# Patient Record
Sex: Male | Born: 1960 | Race: White | Hispanic: No | Marital: Married | Smoking: Current every day smoker
Health system: Southern US, Community
[De-identification: ages and names within clinical notes are randomized; demographics above are authoritative.]

## PROBLEM LIST (undated history)

## (undated) DIAGNOSIS — I119 Hypertensive heart disease without heart failure: Secondary | ICD-10-CM

## (undated) DIAGNOSIS — C3490 Malignant neoplasm of unspecified part of unspecified bronchus or lung: Secondary | ICD-10-CM

## (undated) DIAGNOSIS — K922 Gastrointestinal hemorrhage, unspecified: Secondary | ICD-10-CM

## (undated) DIAGNOSIS — I82402 Acute embolism and thrombosis of unspecified deep veins of left lower extremity: Secondary | ICD-10-CM

## (undated) DIAGNOSIS — E785 Hyperlipidemia, unspecified: Secondary | ICD-10-CM

## (undated) DIAGNOSIS — D649 Anemia, unspecified: Secondary | ICD-10-CM

## (undated) DIAGNOSIS — I2510 Atherosclerotic heart disease of native coronary artery without angina pectoris: Secondary | ICD-10-CM

## (undated) DIAGNOSIS — I739 Peripheral vascular disease, unspecified: Secondary | ICD-10-CM

## (undated) DIAGNOSIS — Z720 Tobacco use: Secondary | ICD-10-CM

## (undated) DIAGNOSIS — IMO0002 Reserved for concepts with insufficient information to code with codable children: Secondary | ICD-10-CM

## (undated) HISTORY — DX: Hyperlipidemia, unspecified: E78.5

## (undated) HISTORY — DX: Peripheral vascular disease, unspecified: I73.9

## (undated) HISTORY — DX: Atherosclerotic heart disease of native coronary artery without angina pectoris: I25.10

## (undated) HISTORY — DX: Tobacco use: Z72.0

## (undated) HISTORY — DX: Malignant neoplasm of unspecified part of unspecified bronchus or lung: C34.90

## (undated) HISTORY — DX: Acute embolism and thrombosis of unspecified deep veins of left lower extremity: I82.402

## (undated) HISTORY — DX: Anemia, unspecified: D64.9

## (undated) HISTORY — DX: Gastrointestinal hemorrhage, unspecified: K92.2

## (undated) HISTORY — DX: Hypertensive heart disease without heart failure: I11.9

---

## 2008-08-30 ENCOUNTER — Emergency Department: Payer: Self-pay | Admitting: Emergency Medicine

## 2009-11-28 ENCOUNTER — Emergency Department: Payer: Self-pay | Admitting: Unknown Physician Specialty

## 2010-07-03 ENCOUNTER — Ambulatory Visit: Payer: Self-pay | Admitting: Internal Medicine

## 2014-04-07 ENCOUNTER — Emergency Department: Payer: Self-pay | Admitting: Emergency Medicine

## 2014-04-16 DIAGNOSIS — I82402 Acute embolism and thrombosis of unspecified deep veins of left lower extremity: Secondary | ICD-10-CM

## 2014-04-16 HISTORY — DX: Acute embolism and thrombosis of unspecified deep veins of left lower extremity: I82.402

## 2014-06-21 ENCOUNTER — Ambulatory Visit: Payer: Self-pay | Admitting: Family Medicine

## 2014-07-01 ENCOUNTER — Emergency Department (HOSPITAL_COMMUNITY)
Admission: EM | Admit: 2014-07-01 | Discharge: 2014-07-02 | Disposition: A | Discharge status: Home / Self Care | Payer: No Typology Code available for payment source | Attending: Emergency Medicine | Admitting: Emergency Medicine

## 2014-07-01 ENCOUNTER — Encounter (HOSPITAL_COMMUNITY): Payer: Self-pay | Admitting: Emergency Medicine

## 2014-07-01 DIAGNOSIS — Z720 Tobacco use: Secondary | ICD-10-CM | POA: Insufficient documentation

## 2014-07-01 DIAGNOSIS — L03115 Cellulitis of right lower limb: Secondary | ICD-10-CM

## 2014-07-01 DIAGNOSIS — D649 Anemia, unspecified: Secondary | ICD-10-CM | POA: Diagnosis not present

## 2014-07-01 DIAGNOSIS — R52 Pain, unspecified: Secondary | ICD-10-CM

## 2014-07-01 DIAGNOSIS — M79661 Pain in right lower leg: Secondary | ICD-10-CM | POA: Diagnosis present

## 2014-07-01 DIAGNOSIS — M25571 Pain in right ankle and joints of right foot: Secondary | ICD-10-CM | POA: Diagnosis not present

## 2014-07-01 DIAGNOSIS — D509 Iron deficiency anemia, unspecified: Secondary | ICD-10-CM

## 2014-07-01 NOTE — ED Provider Notes (Signed)
CSN: [id number]     Arrival date & time 07/01/14  2153 History  This chart was scribed for Delora Fuel, MD by Rayfield Citizen, ED Scribe. This patient was seen in room A05C/A05C and the patient's care was started at 1:44 AM.    Chief Complaint  Patient presents with  . Leg Pain  . Leg Swelling   Patient is a 54 y.o. male presenting with leg pain. The history is provided by the patient. No language interpreter was used.  Leg Pain Associated symptoms: no fatigue     HPI Comments: DINERO CHAVIRA is a 54 y.o. male who presents to the Emergency Department complaining of right ankle and leg pain with redness and swelling. He reports cramping throughout the right leg, extending from ankle to groin. Patient explains he does a significant amount of walking and lifting; he denies any recent injury or trauma to the area. He currently rates his pain as 10/10, described as achy and sharp. He denies SOB, chest pain/pressure/heaviness/tightness, fever, chills, or sweats.   Beginning 6 weeks PTA, patient noted painful swelling to the top of his right foot; he was seen at [facility] at that time and told he had two stress fractures and a bone spur; he began wearing a support boot which provided transient relief. He was seen again 2 weeks PTA with increased swelling around the ankle and lower leg; at that time, he was diagnosed with cellulitis at an Urgent Care clinic and given Bactrim and Indomethacin.   He is scheduled to be seen at [facility] walk in clinic on 3/22. No PCP at this time; has not been seen in "20 - 25 years."  Past Medical History  Diagnosis Date  . Foot pain    History reviewed. No pertinent past surgical history. No family history on file. History  Substance Use Topics  . Smoking status: Current Every Day Smoker  . Smokeless tobacco: Not on file  . Alcohol Use: Yes    Review of Systems  Constitutional: Negative for chills, diaphoresis and fatigue.  Respiratory: Negative for shortness of  breath.   Cardiovascular: Negative for chest pain.  Musculoskeletal:       Right ankle and foot pain and swelling  All other systems reviewed and are negative.   Allergies  Contrast media  Home Medications   Prior to Admission medications   Not on File   BP 158/100 mmHg  Pulse 84  Temp(Src) 97.7 F (36.5 C) (Oral)  Resp 12  Ht 5\' 11"  (1.803 m)  Wt 176 lb (79.833 kg)  BMI 24.56 kg/m2  SpO2 100% Physical Exam  Constitutional: He is oriented to person, place, and time. He appears well-developed and well-nourished. No distress.  HENT:  Head: Normocephalic and atraumatic.  Mouth/Throat: Oropharynx is clear and moist. No oropharyngeal exudate.  Moist mucous membranes  Eyes: EOM are normal. Pupils are equal, round, and reactive to light.  Neck: Normal range of motion. Neck supple. No JVD present.  Cardiovascular: Normal rate and regular rhythm.  Exam reveals no gallop.   No murmur heard. Pulmonary/Chest: Effort normal and breath sounds normal. He has no wheezes. He has no rales.  Abdominal: Soft. Bowel sounds are normal. He exhibits no mass. There is no tenderness. There is no rebound and no guarding.  Musculoskeletal: Normal range of motion. He exhibits edema and tenderness.  Right ankle: moderate swelling, erythema and warmth with diffuse tenderness. No calf swelling but mild calf tenderness. No cord.   Lymphadenopathy:  He has no cervical adenopathy.  Neurological: He is alert and oriented to person, place, and time. He has normal reflexes. He displays normal reflexes. No cranial nerve deficit. Coordination normal.  Skin: Skin is warm and dry. No rash noted.  Psychiatric: He has a normal mood and affect. His behavior is normal. Thought content normal.  Nursing note and vitals reviewed.   ED Course  Procedures   DIAGNOSTIC STUDIES: Oxygen Saturation is 98% on RA, normal by my interpretation.    COORDINATION OF CARE: 1:53 AM Discussed treatment plan with pt at bedside  and pt agreed to plan.   Labs Review Results for orders placed or performed during the hospital encounter of 18/29/93  Basic metabolic panel  Result Value Ref Range   Sodium 133 (L) 135 - 145 mmol/L   Potassium 4.4 3.5 - 5.1 mmol/L   Chloride 103 96 - 112 mmol/L   CO2 22 19 - 32 mmol/L   Glucose, Bld 105 (H) 70 - 99 mg/dL   BUN 6 6 - 23 mg/dL   Creatinine, Ser 0.74 0.50 - 1.35 mg/dL   Calcium 9.0 8.4 - 10.5 mg/dL   GFR calc non Af Amer >90 >90 mL/min   GFR calc Af Amer >90 >90 mL/min   Anion gap 8 5 - 15  CBC with Differential  Result Value Ref Range   WBC 6.3 4.0 - 10.5 K/uL   RBC 4.55 4.22 - 5.81 MIL/uL   Hemoglobin 9.3 (L) 13.0 - 17.0 g/dL   HCT 31.3 (L) 39.0 - 52.0 %   MCV 68.8 (L) 78.0 - 100.0 fL   MCH 20.4 (L) 26.0 - 34.0 pg   MCHC 29.7 (L) 30.0 - 36.0 g/dL   RDW 19.3 (H) 11.5 - 15.5 %   Platelets 223 150 - 400 K/uL   Neutrophils Relative % 60 43 - 77 %   Neutro Abs 3.8 1.7 - 7.7 K/uL   Lymphocytes Relative 28 12 - 46 %   Lymphs Abs 1.7 0.7 - 4.0 K/uL   Monocytes Relative 9 3 - 12 %   Monocytes Absolute 0.6 0.1 - 1.0 K/uL   Eosinophils Relative 2 0 - 5 %   Eosinophils Absolute 0.1 0.0 - 0.7 K/uL   Basophils Relative 1 0 - 1 %   Basophils Absolute 0.1 0.0 - 0.1 K/uL  Uric acid  Result Value Ref Range   Uric Acid, Serum 7.2 4.0 - 7.8 mg/dL  Sedimentation rate  Result Value Ref Range   Sed Rate 17 (H) 0 - 16 mm/hr  D-dimer, quantitative  Result Value Ref Range   D-Dimer, Quant 0.29 0.00 - 0.48 ug/mL-FEU   Imaging Review Dg Ankle Complete Right  07/02/2014   CLINICAL DATA:  Chronic right ankle pain and swelling. Initial encounter.  EXAM: RIGHT ANKLE - COMPLETE 3+ VIEW  COMPARISON:  None.  FINDINGS: There is no evidence of fracture or dislocation. The ankle mortise is incompletely assessed but appears grossly intact; the interosseous space is within normal limits. No talar tilt or subluxation is seen. A small plantar calcaneal spur is noted.  The joint spaces are  preserved. There is mild diffuse edema within Kager's fat pad. Mild soft tissue edema is noted overlying the lateral malleolus.  IMPRESSION: No evidence of fracture or dislocation.   Electronically Signed   By: Garald Balding M.D.   On: 07/02/2014 03:04   Dg Foot Complete Right  07/02/2014   CLINICAL DATA:  Chronic right ankle swelling and pain.  Initial encounter.  EXAM: RIGHT FOOT COMPLETE - 3+ VIEW  COMPARISON:  None.  FINDINGS: There is no evidence of fracture or dislocation. There is mild diffuse osteopenia of visualized osseous structures. The joint spaces are preserved. There is no evidence of talar subluxation; the subtalar joint is unremarkable in appearance. A small plantar calcaneal spur is noted.  No significant soft tissue abnormalities are seen.  IMPRESSION: 1. No evidence of fracture or dislocation. 2. Mild diffuse osteopenia of visualized osseous structures.   Electronically Signed   By: Garald Balding M.D.   On: 07/02/2014 03:03      MDM   Final diagnoses:  Pain  Cellulitis of right lower leg  Microcytic anemia    Pain and swelling around the right ankle which has the appearance of cellulitis. Also need to consider possibility of gout although prolonged course of fever and usual. He has been given a course of antibiotics would be appropriate for MRSA from but has failed. He does not appear toxic. Screening labs are obtained showing a microcytic anemia. No prior labs are available to tell this as well. Patient is not aware of having anemia in the past. This will need to be investigated as an outpatient. I feel that this is worthy of one more attempt at outpatient treatment. He is discharged with a prescription for cephalexin. He has an appointment scheduled with [facility] in 4 days. If he is not improving at that time, may need to consider inpatient treatment.   I personally performed the services described in this documentation, which was scribed in my  presence. The recorded information has been reviewed and is accurate.       Delora Fuel, MD 84/16/60 [id number]

## 2014-07-01 NOTE — ED Notes (Signed)
Onset 6 weeks ago denies injury right foot and gradually developed swelling foot and ankle. Seen Doctor taken x-rays and given antibiotics for cellulitis. Completed medication however pain continued now radiating right upper leg. Pain currently 10/10 achy sharp.

## 2014-07-02 ENCOUNTER — Emergency Department (HOSPITAL_COMMUNITY): Payer: No Typology Code available for payment source

## 2014-07-02 LAB — CBC WITH DIFFERENTIAL/PLATELET
BASOS PCT: 1 % (ref 0–1)
Basophils Absolute: 0.1 10*3/uL (ref 0.0–0.1)
Eosinophils Absolute: 0.1 10*3/uL (ref 0.0–0.7)
Eosinophils Relative: 2 % (ref 0–5)
HCT: 31.3 % — ABNORMAL LOW (ref 39.0–52.0)
HEMOGLOBIN: 9.3 g/dL — AB (ref 13.0–17.0)
LYMPHS ABS: 1.7 10*3/uL (ref 0.7–4.0)
Lymphocytes Relative: 28 % (ref 12–46)
MCH: 20.4 pg — ABNORMAL LOW (ref 26.0–34.0)
MCHC: 29.7 g/dL — ABNORMAL LOW (ref 30.0–36.0)
MCV: 68.8 fL — ABNORMAL LOW (ref 78.0–100.0)
Monocytes Absolute: 0.6 10*3/uL (ref 0.1–1.0)
Monocytes Relative: 9 % (ref 3–12)
NEUTROS ABS: 3.8 10*3/uL (ref 1.7–7.7)
NEUTROS PCT: 60 % (ref 43–77)
Platelets: 223 10*3/uL (ref 150–400)
RBC: 4.55 MIL/uL (ref 4.22–5.81)
RDW: 19.3 % — ABNORMAL HIGH (ref 11.5–15.5)
WBC: 6.3 10*3/uL (ref 4.0–10.5)

## 2014-07-02 LAB — URIC ACID: Uric Acid, Serum: 7.2 mg/dL (ref 4.0–7.8)

## 2014-07-02 LAB — BASIC METABOLIC PANEL
Anion gap: 8 (ref 5–15)
BUN: 6 mg/dL (ref 6–23)
CO2: 22 mmol/L (ref 19–32)
Calcium: 9 mg/dL (ref 8.4–10.5)
Chloride: 103 mmol/L (ref 96–112)
Creatinine, Ser: 0.74 mg/dL (ref 0.50–1.35)
GFR calc Af Amer: >90 mL/min (ref >90)
GFR calc non Af Amer: >90 mL/min (ref >90)
Glucose, Bld: 105 mg/dL — ABNORMAL HIGH (ref 70–99)
Potassium: 4.4 mmol/L (ref 3.5–5.1)
Sodium: 133 mmol/L — ABNORMAL LOW (ref 135–145)

## 2014-07-02 LAB — SEDIMENTATION RATE: Sed Rate: 17 mm/hr — ABNORMAL HIGH (ref 0–16)

## 2014-07-02 LAB — D-DIMER, QUANTITATIVE (NOT AT ARMC): D DIMER QUANT: 0.29 ug{FEU}/mL (ref 0.00–0.48)

## 2014-07-02 MED ORDER — IBUPROFEN 800 MG PO TABS
800.0000 mg | ORAL_TABLET | Freq: Once | ORAL | Status: AC
  Administered 2014-07-02: 800 mg via ORAL
  Filled 2014-07-02: qty 1

## 2014-07-02 MED ORDER — CEPHALEXIN 500 MG PO CAPS: 500.0000 mg | ORAL_CAPSULE | Freq: Four times a day (QID) | ORAL | Status: DC

## 2014-07-02 MED ORDER — ACETAMINOPHEN 325 MG PO TABS
650.0000 mg | ORAL_TABLET | Freq: Once | ORAL | Status: AC
  Administered 2014-07-02: 650 mg via ORAL
  Filled 2014-07-02: qty 2

## 2014-07-02 NOTE — ED Notes (Signed)
Patient presents today with swelling and redness to his right ankle and foot. States 6 weeks ago he was diagnosed with a stress fracture in the right foot. Swelling on top of foot with Fx that went away when patient began wearing support boot. States the swelling returned @ 4weeks later and was also around the ankle and lower leg. Visited an urgent care clinic and diagnosed with cellulitis, placed on ABT Bactrim and Indomethacin. ABT is complete and swelling/pain remains. BLE cramping and vomiting today.

## 2014-07-02 NOTE — ED Notes (Signed)
Patient asked for and received a cup of ice water.

## 2014-07-02 NOTE — ED Notes (Signed)
Patient is being wheeled out in his own wheel chair by spouse. Stated understanding of the discharge instructions and education about how to access a computer and where to receive computer classes in order to log into My Chart also occurred.

## 2014-07-02 NOTE — Discharge Instructions (Signed)
Cellulitis Cellulitis is an infection of the skin and the tissue beneath it. The infected area is usually red and tender. Cellulitis occurs most often in the arms and lower legs.  CAUSES  Cellulitis is caused by bacteria that enter the skin through cracks or cuts in the skin. The most common types of bacteria that cause cellulitis are staphylococci and streptococci. SIGNS AND SYMPTOMS   Redness and warmth.  Swelling.  Tenderness or pain.  Fever. DIAGNOSIS  Your health care provider can usually determine what is wrong based on a physical exam. Blood tests may also be done. TREATMENT  Treatment usually involves taking an antibiotic medicine. HOME CARE INSTRUCTIONS   Take your antibiotic medicine as directed by your health care provider. Finish the antibiotic even if you start to feel better.  Keep the infected arm or leg elevated to reduce swelling.  Apply a warm cloth to the affected area up to 4 times per day to relieve pain.  Take medicines only as directed by your health care provider.  Keep all follow-up visits as directed by your health care provider. SEEK MEDICAL CARE IF:   You notice red streaks coming from the infected area.  Your red area gets larger or turns dark in color.  Your bone or joint underneath the infected area becomes painful after the skin has healed.  Your infection returns in the same area or another area.  You notice a swollen bump in the infected area.  You develop new symptoms.  You have a fever. SEEK IMMEDIATE MEDICAL CARE IF:   You feel very sleepy.  You develop vomiting or diarrhea.  You have a general ill feeling (malaise) with muscle aches and pains. MAKE SURE YOU:   Understand these instructions.  Will watch your condition.  Will get help right away if you are not doing well or get worse. Document Released: 01/10/2005 Document Revised: 08/17/2013 Document Reviewed: 06/18/2011 [facility] Patient Information 2015 [place], [place].  This information is not intended to replace advice given to you by your health care provider. Make sure you discuss any questions you have with your health care provider.  Anemia, Nonspecific Anemia is a condition in which the concentration of red blood cells or hemoglobin in the blood is below normal. Hemoglobin is a substance in red blood cells that carries oxygen to the tissues of the body. Anemia results in not enough oxygen reaching these tissues.  CAUSES  Common causes of anemia include:   Excessive bleeding. Bleeding may be internal or external. This includes excessive bleeding from periods (in women) or from the intestine.   Poor nutrition.   Chronic kidney, thyroid, and liver disease.  Bone marrow disorders that decrease red blood cell production.  Cancer and treatments for cancer.  HIV, AIDS, and their treatments.  Spleen problems that increase red blood cell destruction.  Blood disorders.  Excess destruction of red blood cells due to infection, medicines, and autoimmune disorders. SIGNS AND SYMPTOMS   Minor weakness.   Dizziness.   Headache.  Palpitations.   Shortness of breath, especially with exercise.   Paleness.  Cold sensitivity.  Indigestion.  Nausea.  Difficulty sleeping.  Difficulty concentrating. Symptoms may occur suddenly or they may develop slowly.  DIAGNOSIS  Additional blood tests are often needed. These help your health care provider determine the best treatment. Your health care provider will check your stool for blood and look for other causes of blood loss.  TREATMENT  Treatment varies depending on the cause of the  anemia. Treatment can include:   Supplements of iron, vitamin G40, or folic acid.   Hormone medicines.   A blood transfusion. This may be needed if blood loss is severe.   Hospitalization. This may be needed if there is significant continual blood loss.   Dietary changes.  Spleen removal. HOME CARE  INSTRUCTIONS Keep all follow-up appointments. It often takes many weeks to correct anemia, and having your health care provider check on your condition and your response to treatment is very important. SEEK IMMEDIATE MEDICAL CARE IF:   You develop extreme weakness, shortness of breath, or chest pain.   You become dizzy or have trouble concentrating.  You develop heavy vaginal bleeding.   You develop a rash.   You have bloody or black, tarry stools.   You faint.   You vomit up blood.   You vomit repeatedly.   You have abdominal pain.  You have a fever or persistent symptoms for more than 2-3 days.   You have a fever and your symptoms suddenly get worse.   You are dehydrated.  MAKE SURE YOU:  Understand these instructions.  Will watch your condition.  Will get help right away if you are not doing well or get worse. Document Released: 05/10/2004 Document Revised: 12/03/2012 Document Reviewed: 09/26/2012 [facility] Patient Information 2015 [place], [place]. This information is not intended to replace advice given to you by your health care provider. Make sure you discuss any questions you have with your health care provider.  Cephalexin tablets or capsules What is this medicine? CEPHALEXIN (sef a LEX in) is a cephalosporin antibiotic. It is used to treat certain kinds of bacterial infections It will not work for colds, flu, or other viral infections. This medicine may be used for other purposes; ask your health care provider or pharmacist if you have questions. COMMON BRAND NAME(S): Biocef, Keflex, Keftab What should I tell my health care provider before I take this medicine? They need to know if you have any of these conditions: -kidney disease -stomach or intestine problems, especially colitis -an unusual or allergic reaction to cephalexin, other cephalosporins, penicillins, other antibiotics, medicines, foods, dyes or preservatives -pregnant or trying to get  pregnant -breast-feeding How should I use this medicine? Take this medicine by mouth with a full glass of water. Follow the directions on the prescription label. This medicine can be taken with or without food. Take your medicine at regular intervals. Do not take your medicine more often than directed. Take all of your medicine as directed even if you think you are better. Do not skip doses or stop your medicine early. Talk to your pediatrician regarding the use of this medicine in children. While this drug may be prescribed for selected conditions, precautions do apply. Overdosage: If you think you have taken too much of this medicine contact a poison control center or emergency room at once. NOTE: This medicine is only for you. Do not share this medicine with others. What if I miss a dose? If you miss a dose, take it as soon as you can. If it is almost time for your next dose, take only that dose. Do not take double or extra doses. There should be at least 4 to 6 hours between doses. What may interact with this medicine? -probenecid -some other antibiotics This list may not describe all possible interactions. Give your health care provider a list of all the medicines, herbs, non-prescription drugs, or dietary supplements you use. Also tell them if you  smoke, drink alcohol, or use illegal drugs. Some items may interact with your medicine. What should I watch for while using this medicine? Tell your doctor or health care professional if your symptoms do not begin to improve in a few days. Do not treat diarrhea with over the counter products. Contact your doctor if you have diarrhea that lasts more than 2 days or if it is severe and watery. If you have diabetes, you may get a false-positive result for sugar in your urine. Check with your doctor or health care professional. What side effects may I notice from receiving this medicine? Side effects that you should report to your doctor or health care  professional as soon as possible: -allergic reactions like skin rash, itching or hives, swelling of the face, lips, or tongue -breathing problems -pain or trouble passing urine -redness, blistering, peeling or loosening of the skin, including inside the mouth -severe or watery diarrhea -unusually weak or tired -yellowing of the eyes, skin Side effects that usually do not require medical attention (report to your doctor or health care professional if they continue or are bothersome): -gas or heartburn -genital or anal irritation -headache -joint or muscle pain -nausea, vomiting This list may not describe all possible side effects. Call your doctor for medical advice about side effects. You may report side effects to FDA at 1-800-FDA-1088. Where should I keep my medicine? Keep out of the reach of children. Store at room temperature between 59 and 86 degrees F (15 and 30 degrees C). Throw away any unused medicine after the expiration date. NOTE: This sheet is a summary. It may not cover all possible information. If you have questions about this medicine, talk to your doctor, pharmacist, or health care provider.  2015, [employer]vier/[employer]. (2007-07-07 17:09:13)

## 2014-07-06 ENCOUNTER — Ambulatory Visit (INDEPENDENT_AMBULATORY_CARE_PROVIDER_SITE_OTHER): Payer: No Typology Code available for payment source | Admitting: Internal Medicine

## 2014-07-06 ENCOUNTER — Encounter: Payer: Self-pay | Admitting: Internal Medicine

## 2014-07-06 VITALS — BP 163/85 | HR 89 | Temp 98.0°F | Ht 70.5 in | Wt 178.5 lb

## 2014-07-06 DIAGNOSIS — R030 Elevated blood-pressure reading, without diagnosis of hypertension: Secondary | ICD-10-CM | POA: Diagnosis not present

## 2014-07-06 DIAGNOSIS — M79671 Pain in right foot: Secondary | ICD-10-CM

## 2014-07-06 DIAGNOSIS — Z0000 Encounter for general adult medical examination without abnormal findings: Secondary | ICD-10-CM

## 2014-07-06 DIAGNOSIS — F17210 Nicotine dependence, cigarettes, uncomplicated: Secondary | ICD-10-CM

## 2014-07-06 DIAGNOSIS — M79609 Pain in unspecified limb: Secondary | ICD-10-CM | POA: Insufficient documentation

## 2014-07-06 DIAGNOSIS — F17200 Nicotine dependence, unspecified, uncomplicated: Secondary | ICD-10-CM | POA: Insufficient documentation

## 2014-07-06 DIAGNOSIS — M79672 Pain in left foot: Secondary | ICD-10-CM

## 2014-07-06 DIAGNOSIS — Z720 Tobacco use: Secondary | ICD-10-CM

## 2014-07-06 DIAGNOSIS — R569 Unspecified convulsions: Secondary | ICD-10-CM | POA: Diagnosis not present

## 2014-07-06 DIAGNOSIS — IMO0001 Reserved for inherently not codable concepts without codable children: Secondary | ICD-10-CM

## 2014-07-06 MED ORDER — PREDNISONE 20 MG PO TABS: 40.0000 mg | ORAL_TABLET | Freq: Every day | ORAL | Status: DC

## 2014-07-06 NOTE — Assessment & Plan Note (Signed)
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 He is unsure why he had them.

## 2014-07-06 NOTE — Progress Notes (Signed)
   Subjective:    Patient ID: Philip Philip [name], male    DOB: September 30, 1960, 54 y.o.   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 He was seen in the ED for R ankle and leg pain, redness, and swelling. He says this started on the dorsal aspect of foot and spread. He went to the ED at [facility] the end of January and was told he had a stress fracture in his R foot and the start of a spur in his heel. He was given a boot and stayed off it for a week but it did not improve. He then went to an urgent care in [place] who thought it was cellulitis and gave him a 10 day course of indomethacin and Bactrim. He does not think that this helped. 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 His car has been broken and he is walking 7 miles each way to work which takes two hours on crutches his wife had left over. He then came to our ED and got his 10 day course of keflex. R foot and ankle x-rays were negative at that time. He does not think that this has helped either. He says the pain has waxed and waned over the course of these two months. He says that laying in bed at night makes it better but it is worse when he gets up and walks at night. He does not note any drainage or previous trauma. He says that it hurts so bad he has gotten nauseous. He denies fevers, chills, or night sweats.  Review of Systems  Constitutional: Negative for fever, chills and diaphoresis.  Respiratory: Negative for shortness of breath.   Cardiovascular: Negative for chest pain.  Gastrointestinal: Negative for nausea, vomiting, abdominal pain, diarrhea and constipation.  Musculoskeletal: Positive for joint swelling and arthralgias.  Neurological: Negative for dizziness, weakness, light-headedness and numbness.       Objective:   Physical Exam  Constitutional: He is oriented to person, place, and time. He appears well-developed and  well-nourished. No distress.  HENT:  Head: Normocephalic and atraumatic.  Mouth/Throat: Oropharynx is clear and moist.  Cardiovascular: Normal rate, regular rhythm, normal heart sounds and intact distal pulses.  Exam reveals no gallop and no friction rub.   No murmur heard. Pulmonary/Chest: Effort normal and breath sounds normal. No respiratory distress. He has no wheezes.  Abdominal: Soft. Bowel sounds are normal. He exhibits no distension. There is no tenderness.  Musculoskeletal:  L leg and foot normal. R foot with warmth and minimal erythema up to ankle, swelling most prominent in the medial ankle, Tenderness on ankle and dorsal aspect of foot. 2+ DP pulse with strength and sensation intact.  Neurological: He is alert and oriented to person, place, and time.  Skin: He is not diaphoretic.  Vitals reviewed.         Assessment & Plan:

## 2014-07-06 NOTE — Assessment & Plan Note (Signed)
  Assessment: Progress toward smoking cessation:  smoking the same amount Barriers to progress toward smoking cessation:  lack of motivation to quit Comments: He smokes at least a pack a day since he was a teenager. He says he is not interested in [place]: Instruction/counseling given:  I counseled patient on the dangers of tobacco use, advised patient to stop smoking, and reviewed strategies to maximize success. Educational resources provided:  [employer] account manager) brochure (denies) Self management tools provided:    Medications to assist with smoking cessation:  None Patient agreed to the following self-care plans for smoking cessation:    Other plans: revisit at each visit

## 2014-07-06 NOTE — Assessment & Plan Note (Signed)
BP today 163/85. Will continue to monitor before starting any antihypertensives

## 2014-07-06 NOTE — Assessment & Plan Note (Signed)
Records requested from [facility]. He says he had colonoscopy in the past but [place] over phone says it was 1989. -vaccines and GI referral on repeat visit

## 2014-07-06 NOTE — Assessment & Plan Note (Signed)
Philip [name] has R foot and ankle pain for two months. He has previously been told he had a stress fracture (record unavailable) but R foot and ankle x-ray at [facility] were negative. He was treated for presumed cellulitis and failed 10 days of Bactrim from [facility] urgent care and is so far not responsive in middle of 10 d course of keflex from [facility]. He has no systemic signs of infection with no fevers, chills, night sweats. It would be unusual for cellulitis of this duration to fail antibiotics and not progress more. No foreign body on x-ray. DVT unlikely as d-dimer in [facility] negative and calf not involved. This could represent gout as it primarily involves the ankle. However ESR not elevated in ED and pain did not respond to indomethacin prescribed by [place] urgent care. He could also have a tendinopathy or plantar fasciitis that is being further aggravated by his walking to work where he then stands.  -prednisone 40 mg x 7 days then RTC -if better with steroids, then consider colchicine 0.6 mg daily for 5-6 weeks and then recheck uric acid and consider allopurinol

## 2014-07-06 NOTE — Patient Instructions (Signed)
It was a pleasure to see you today. Please take two tabs of prednisone (40 mg total) daily in the morning, except today when you pick it up take it right away. Please return to clinic or seek medical attention if you have any new or worsening foot swelling, pain, or other worrisome medical condition. We look forward to seeing you again in one week.  Lottie Mussel, MD  General Instructions:   Thank you for bringing your medicines today. This helps [place] keep you safe from mistakes.   Progress Toward Treatment Goals:  No flowsheet data found.  Self Care Goals & Plans:  Self Care Goal 07/06/2014  Manage my medications take my medicines as prescribed; bring my medications to every visit; refill my medications on time  Eat healthy foods drink diet soda or water instead of juice or soda; eat more vegetables; eat foods that are low in salt; eat baked foods instead of fried foods; eat fruit for snacks and desserts    No flowsheet data found.   Care Management & Community Referrals:  No flowsheet data found.

## 2014-07-07 NOTE — Progress Notes (Signed)
I saw and evaluated the patient.  I personally confirmed the key portions of Dr. Bebe Shaggy history and exam and reviewed pertinent patient test results.  The assessment, diagnosis, and plan were formulated together and I agree with the documentation in the resident's note.  After reviewing the history with the patient and examining Mr. Woodcox I am leaning to this representing acute gout.  He is very tender over the medial aspect of the right ankle with minimal touch, his ankle is swollen and minimally erythematous.  Failure to respond to antibiotics suggests this is not a cellulitis, nor does it clinically look like one.  Will try to achieve immediately relief with prednisone 40 mg PO QAM X 1 week (bleeds with ibuprofen so we are avoiding NSAIDs).  If he gets better with prednisone my suspicion for gout escalates and he may benefit from colchicine 0.6 mg daily for 5-6 weeks.  If he has no further flares we can check a uric acid level and start allopurinol with the goal of titrating to a uric acid < 6.0.  Once there for 6 months, the colchicine can be discontinued and he can be maintained on allopurinol at the dose that lowers his uric acid to < 6.0 thereafter.

## 2014-07-08 ENCOUNTER — Telehealth: Payer: Self-pay | Admitting: Internal Medicine

## 2014-07-08 NOTE — Telephone Encounter (Signed)
Call to patient to confirm appointment for 07/13/14 at 3:45 lmtcb

## 2014-07-13 ENCOUNTER — Encounter: Payer: Self-pay | Admitting: Internal Medicine

## 2014-07-13 ENCOUNTER — Ambulatory Visit (INDEPENDENT_AMBULATORY_CARE_PROVIDER_SITE_OTHER): Payer: No Typology Code available for payment source | Admitting: Internal Medicine

## 2014-07-13 VITALS — BP 169/87 | HR 87 | Temp 97.6°F | Ht 70.0 in | Wt 178.1 lb

## 2014-07-13 DIAGNOSIS — R030 Elevated blood-pressure reading, without diagnosis of hypertension: Secondary | ICD-10-CM

## 2014-07-13 DIAGNOSIS — M79671 Pain in right foot: Secondary | ICD-10-CM

## 2014-07-13 DIAGNOSIS — IMO0001 Reserved for inherently not codable concepts without codable children: Secondary | ICD-10-CM

## 2014-07-13 MED ORDER — HYDROCODONE-ACETAMINOPHEN 5-325 MG PO TABS: 1.0000 | ORAL_TABLET | Freq: Four times a day (QID) | ORAL | Status: DC | PRN

## 2014-07-13 NOTE — Progress Notes (Signed)
I saw and evaluated the patient. I personally confirmed the key portions of Dr. Rothman's history and exam and reviewed pertinent patient test results. The assessment, diagnosis, and plan were formulated together and I agree with the documentation in the resident's note. 

## 2014-07-13 NOTE — Assessment & Plan Note (Signed)
BP today 169/87 and at last visit 163/85. This is in setting of acute pain.  -continue to monitor and consider antihypertensive if elevated when pain relieved or if he has 3 readings elevated

## 2014-07-13 NOTE — Assessment & Plan Note (Signed)
The cause of Philip [name]'s R foot pain is unclear. Cellulitis and septic arthritis unlikely as he has completed a 10 d course of bactrim and 10 d course of keflex and the foot has no more erythema or warmth. Gout was considered but the pain did not respond to the prednisone 40 mg x 7 d. I would also expect a response to steroids if this were a tendinopathy. He says that he was told he had a stress fracture at his first emergency visit but his foot and ankle x-ray at [facility] were negative. It is possible he still has an occult stress fracture that was not visualized on that x-ray. This could be better identified with bone scan v MRI v CT. I will refer to sports medicine to see if they have any further ideas and also to ensure the proper imaging is ordered as finances are a concern of the family. He has not responded well to indomethacin or tylenol. He says ibuprofen makes him urinate blood so will give one time dose of narcotic until he sees sports medicine. 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 He has also had to walk to work (~2 hours each way) due to car issue. He has his wife's crutches and was told to be as non weight bearing as possible. -refer to sports medicine -norco 5-325 mg q6hprn #120, 0 refill -precautions to be as non-weight bearing as possible

## 2014-07-13 NOTE — Patient Instructions (Signed)
It was a pleasure to see you today. You can take the vicodin every six hours as needed for the pain. Please see sports medicine. Please return to clinic or seek medical attention if you have any new or worsening foot pain, swelling, or other worrisome medical condition. We look forward to seeing you again in one month (or move your appointment sooner if you see sports medicine).  Lottie Mussel, MD  General Instructions:   Thank you for bringing your medicines today. This helps [place] keep you safe from mistakes.   Progress Toward Treatment Goals:  Treatment Goal 07/06/2014  Stop smoking smoking the same amount    Self Care Goals & Plans:  Self Care Goal 07/13/2014  Manage my medications take my medicines as prescribed; bring my medications to every visit; refill my medications on time  Eat healthy foods drink diet soda or water instead of juice or soda; eat more vegetables; eat foods that are low in salt; eat baked foods instead of fried foods; eat fruit for snacks and desserts    No flowsheet data found.   Care Management & Community Referrals:  No flowsheet data found.

## 2014-07-13 NOTE — Progress Notes (Signed)
   Subjective:    Patient ID: Philip [name], male    DOB: 1960-08-22, 54 y.o.   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 He was seen in [facility] 07/06/14 for R foot/ankle pain for the past two months as a new patient. He had recent ED x-rays that were negative, negative d-dimer, had failed 10 days of bactrim and was in middle of 10 days of keflex without response. This was thought to possibly be gout or tendinopathy versus plantar fasciitis and he was given 7 days of prednisone 40 mg and told to return in one week.  Today, Philip [name] says that his pain is no better. He completed the antibiotics and the steroids and thinks the steroids may have helped some with the swelling but not pain. He still describes pain mostly in the ankle but also the dorsal aspect of foot. He says it feels like bone on bone and sometimes feels like his ankle is about to lock but it does not. No systemic complaints such as fever, chills, night sweats.   Review of Systems  Constitutional: Negative for fever, chills and diaphoresis.  Respiratory: Negative for cough and shortness of breath.   Cardiovascular: Negative for chest pain.  Gastrointestinal: Negative for nausea, vomiting, abdominal pain, diarrhea and constipation.  Musculoskeletal: Positive for joint swelling and arthralgias.  Neurological: Negative for weakness, light-headedness, numbness and headaches.       Objective:   Physical Exam  Constitutional: He is oriented to person, place, and time. He appears well-developed and well-nourished. No distress.  HENT:  Head: Normocephalic and atraumatic.  Mouth/Throat: Oropharynx is clear and moist.  Eyes: EOM are normal. Pupils are equal, round, and reactive to light.  Cardiovascular: Normal rate, regular rhythm, normal heart sounds and intact distal pulses.  Exam reveals no gallop and no friction rub.   No murmur  heard. Pulmonary/Chest: Effort normal and breath sounds normal. No respiratory distress. He has no wheezes.  Abdominal: Soft. Bowel sounds are normal. He exhibits no distension. There is no tenderness.  Musculoskeletal:  R foot with no warmth, erythema but still some swelling (improved from one week ago), full ROM and strength, tenderness to touch on ankle and dorsal aspect of foot. Sensation and toe proprioception intact. 2+ DP pulse. No signs of trauma.  Neurological: He is alert and oriented to person, place, and time.  Skin: He is not diaphoretic.  Vitals reviewed.         Assessment & Plan:

## 2014-07-20 ENCOUNTER — Encounter: Payer: Self-pay | Admitting: Sports Medicine

## 2014-07-20 ENCOUNTER — Ambulatory Visit (INDEPENDENT_AMBULATORY_CARE_PROVIDER_SITE_OTHER): Payer: No Typology Code available for payment source | Admitting: Sports Medicine

## 2014-07-20 VITALS — BP 169/94 | HR 98 | Ht 71.0 in | Wt 178.0 lb

## 2014-07-20 DIAGNOSIS — M79671 Pain in right foot: Secondary | ICD-10-CM | POA: Diagnosis not present

## 2014-07-20 DIAGNOSIS — M25579 Pain in unspecified ankle and joints of unspecified foot: Secondary | ICD-10-CM | POA: Insufficient documentation

## 2014-07-20 DIAGNOSIS — M25571 Pain in right ankle and joints of right foot: Secondary | ICD-10-CM | POA: Diagnosis not present

## 2014-07-20 NOTE — Assessment & Plan Note (Signed)
3 months of persistent pain and swelling. X-rays with degenerative changes and ankle swelling. Failure to respond to antibiotics and prednisone. -Given the chronic nature of his problem with associated swelling and catching, concern would be for a possible chondral defect or loose body in the ankle joint. -We would like to obtain an MRI of the right ankle to evaluate for possible internal derangement. -If his MRI is most consistent with osteoarthritis of the ankle without significant other findings, we may consider a corticosteroid injection in the right ankle. -He is fitted and a large ankle body helix compression sleeve was applied today. -He may take his previously prescribed anti-inflammatories and pain medication in the interim. -He is given a seated duties only work note. -He will follow-up pending results of the MRI, which I will call him to discuss a further treatment plan.

## 2014-07-20 NOTE — Progress Notes (Signed)
   Subjective:    Patient ID: Philip [name], male    DOB: 02-05-1961, 54 y.o.   MRN: [id number]  HPI Philip [name] is a 54 year-old male who presents with right foot and ankle pain. Onset was about 3 months ago without any known acute injury. 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 He has been seen at the Internal Medicine clinic and treated with a 10-day course of Bactrim and Keflex for initial concern for cellulitis. He initially had redness and warmth located over the dorsum of the entire right foot. This has since resolved. He then was treated with a Prednisone burst (40mg  x 7days), for concern over possible gout, but his symptoms did not improve with these interventions. X-rays were performed on 07/02/14 which did not show any signs of fracture, significant for some degenerative changes at the ankle mortise. He has also tried indomethacin, Tylenol, and recently prescribed Norco #120 tabs.  Since then, he has noticed persistent right ankle pain and swelling. Location of pain is primarily across the talar dome, worse medially. He notes occasional catching and popping in the right ankle. This is associated with pain. His pain occasionally wakes him up at night. Symptoms are aggravated with walking and relieved with rest and elevation. He denies any other joint swelling, malaise, fatigue, fevers, or chills. This has been limiting his daily function and his work. He denies any significant weakness or numbness.  Past medical history, social history, medications, and allergies were reviewed and are up to date in the chart. Review of Systems 7 point review of systems was performed and was otherwise negative unless noted in the history of present illness.     Objective:   Physical Exam BP 169/94 mmHg  Pulse 98  Ht 5\' 11"  (1.803 m)  Wt 178 lb (80.74 kg)  BMI 24.84 kg/m2 GEN: The patient is well-developed well-nourished male and in no acute distress.   He is awake alert and oriented x3. SKIN: warm and well-perfused, no rash  EXTR: No calf tenderness Neuro: Strength 5/5 globally. Sensation intact throughout. No focal deficits. Vasc: +2 bilateral distal pulses. MSK:  Examination of the right foot and ankle reveals significant swelling across the talar dome medially and laterally. Point of maximal tenderness is the medial right talar dome. Negative anterior drawer test of the ankle. Range of motion is grossly intact, though limited due to pain and swelling. No overlying warmth or induration. No skin changes. Good plantar and dorsiflexion strength.     Assessment & Plan:  Please see problem based assessment and plan in the problem list.

## 2014-07-30 ENCOUNTER — Ambulatory Visit
Admission: RE | Admit: 2014-07-30 | Discharge: 2014-07-30 | Disposition: A | Discharge status: Home / Self Care | Payer: No Typology Code available for payment source | Source: Ambulatory Visit | Attending: Sports Medicine | Admitting: Sports Medicine

## 2014-07-30 DIAGNOSIS — M79671 Pain in right foot: Secondary | ICD-10-CM

## 2014-08-04 ENCOUNTER — Telehealth: Payer: Self-pay | Admitting: *Deleted

## 2014-08-04 ENCOUNTER — Encounter: Payer: Self-pay | Admitting: Internal Medicine

## 2014-08-04 ENCOUNTER — Ambulatory Visit (INDEPENDENT_AMBULATORY_CARE_PROVIDER_SITE_OTHER): Payer: No Typology Code available for payment source | Admitting: Internal Medicine

## 2014-08-04 VITALS — BP 152/110 | HR 111 | Temp 97.5°F | Wt 172.3 lb

## 2014-08-04 DIAGNOSIS — IMO0001 Reserved for inherently not codable concepts without codable children: Secondary | ICD-10-CM

## 2014-08-04 DIAGNOSIS — R030 Elevated blood-pressure reading, without diagnosis of hypertension: Secondary | ICD-10-CM | POA: Diagnosis not present

## 2014-08-04 DIAGNOSIS — M25571 Pain in right ankle and joints of right foot: Secondary | ICD-10-CM | POA: Diagnosis not present

## 2014-08-04 DIAGNOSIS — M79671 Pain in right foot: Secondary | ICD-10-CM | POA: Diagnosis not present

## 2014-08-04 MED ORDER — HYDROCODONE-ACETAMINOPHEN 5-325 MG PO TABS: 1.0000 | ORAL_TABLET | Freq: Four times a day (QID) | ORAL | Status: DC | PRN

## 2014-08-04 MED ORDER — COLCHICINE 0.6 MG PO TABS: ORAL_TABLET | ORAL | Status: DC

## 2014-08-04 NOTE — Telephone Encounter (Signed)
Pt states his pain level with his leg/ankle/foot is a 10+ and he had to leave work due to the pain as well as the N/V he experienced. Offered him to come and see Dr. Barbaraann Barthel and the [facility] or the [facility] if the pain was that bad. Due to him not trusting his cars durability, he opted to go to the [employer] at [place].

## 2014-08-04 NOTE — Assessment & Plan Note (Signed)
BP elevated today could be 2/2 pain  Will f/u in 1 month for BP if still elevated will need antihypertensives

## 2014-08-04 NOTE — Assessment & Plan Note (Addendum)
Does not appear as septic jt.  Considered gout as well. No jt deformity other than marrow edema on MRI. If not any of these consider other etiology ?erythema nodosum  Will refer to ortho for arthrocentesis vs go back to [facility] for procedure to confirm of not if gout ASAP. Also disc walk in [place] [facility] [place] [facility]  Will try trial of colchicine 1.2 mg x 1 then 0.6 in 12 hours then 0.6 mg bid can tx up to 6 months if is gout. If gout will need to transition to Allopurinol when not in acute flare  Given norco 5-325 q6 prn #56 to take if pain not controlled with colchicine Gave note for pt to be out of work for another 1-2 weeks until can figure this out

## 2014-08-04 NOTE — Patient Instructions (Addendum)
Please follow up in 1-2 weeks, sooner if needed  We will send you to orthopedics to get an arthrocentesis/joint tapping to confirm if this is or is not gout Try Percell Miller and Noemi Chapel walk in clinic if we cant get appt soon enough We will give you a short course of colchicine to see if that helps with pain take this 1st We will also give you a short course of Norco as well since you are in so much pain. Take this only if pain is uncontrolled with colchicine  We will see you in clinic otherwise in 1 month to check up on your blood pressure. Your blood pressure goal should be <140/90   Gout Gout is an inflammatory arthritis caused by a buildup of uric acid crystals in the joints. Uric acid is a chemical that is normally present in the blood. When the level of uric acid in the blood is too high it can form crystals that deposit in your joints and tissues. This causes joint redness, soreness, and swelling (inflammation). Repeat attacks are common. Over time, uric acid crystals can form into masses (tophi) near a joint, destroying bone and causing disfigurement. Gout is treatable and often preventable. CAUSES  The disease begins with elevated levels of uric acid in the blood. Uric acid is produced by your body when it breaks down a naturally found substance called purines. Certain foods you eat, such as meats and fish, contain high amounts of purines. Causes of an elevated uric acid level include:  Being passed down from parent to child (heredity).  Diseases that cause increased uric acid production (such as obesity, psoriasis, and certain cancers).  Excessive alcohol use.  Diet, especially diets rich in meat and seafood.  Medicines, including certain cancer-fighting medicines (chemotherapy), water pills (diuretics), and aspirin.  Chronic kidney disease. The kidneys are no longer able to remove uric acid well.  Problems with metabolism. Conditions strongly associated with gout  include:  Obesity.  High blood pressure.  High cholesterol.  Diabetes. Not everyone with elevated uric acid levels gets gout. It is not understood why some people get gout and others do not. Surgery, joint injury, and eating too much of certain foods are some of the factors that can lead to gout attacks. SYMPTOMS   An attack of gout comes on quickly. It causes intense pain with redness, swelling, and warmth in a joint.  Fever can occur.  Often, only one joint is involved. Certain joints are more commonly involved:  Base of the big toe.  Knee.  Ankle.  Wrist.  Finger. Without treatment, an attack usually goes away in a few days to weeks. Between attacks, you usually will not have symptoms, which is different from many other forms of arthritis. DIAGNOSIS  Your caregiver will suspect gout based on your symptoms and exam. In some cases, tests may be recommended. The tests may include:  Blood tests.  Urine tests.  X-rays.  Joint fluid exam. This exam requires a needle to remove fluid from the joint (arthrocentesis). Using a microscope, gout is confirmed when uric acid crystals are seen in the joint fluid. TREATMENT  There are two phases to gout treatment: treating the sudden onset (acute) attack and preventing attacks (prophylaxis).  Treatment of an Acute Attack.  Medicines are used. These include anti-inflammatory medicines or steroid medicines.  An injection of steroid medicine into the affected joint is sometimes necessary.  The painful joint is rested. Movement can worsen the arthritis.  You may use warm  or cold treatments on painful joints, depending which works best for you.  Treatment to Prevent Attacks.  If you suffer from frequent gout attacks, your caregiver may advise preventive medicine. These medicines are started after the acute attack subsides. These medicines either help your kidneys eliminate uric acid from your body or decrease your uric acid  production. You may need to stay on these medicines for a very long time.  The early phase of treatment with preventive medicine can be associated with an increase in acute gout attacks. For this reason, during the first few months of treatment, your caregiver may also advise you to take medicines usually used for acute gout treatment. Be sure you understand your caregiver's directions. Your caregiver may make several adjustments to your medicine dose before these medicines are effective.  Discuss dietary treatment with your caregiver or dietitian. Alcohol and drinks high in sugar and fructose and foods such as meat, poultry, and seafood can increase uric acid levels. Your caregiver or dietitian can advise you on drinks and foods that should be limited. HOME CARE INSTRUCTIONS   Do not take aspirin to relieve pain. This raises uric acid levels.  Only take over-the-counter or prescription medicines for pain, discomfort, or fever as directed by your caregiver.  Rest the joint as much as possible. When in bed, keep sheets and blankets off painful areas.  Keep the affected joint raised (elevated).  Apply warm or cold treatments to painful joints. Use of warm or cold treatments depends on which works best for you.  Use crutches if the painful joint is in your leg.  Drink enough fluids to keep your urine clear or pale yellow. This helps your body get rid of uric acid. Limit alcohol, sugary drinks, and fructose drinks.  Follow your dietary instructions. Pay careful attention to the amount of protein you eat. Your daily diet should emphasize fruits, vegetables, whole grains, and fat-free or low-fat milk products. Discuss the use of coffee, vitamin C, and cherries with your caregiver or dietitian. These may be helpful in lowering uric acid levels.  Maintain a healthy body weight. SEEK MEDICAL CARE IF:   You develop diarrhea, vomiting, or any side effects from medicines.  You do not feel better in  24 hours, or you are getting worse. SEEK IMMEDIATE MEDICAL CARE IF:   Your joint becomes suddenly more tender, and you have chills or a fever. MAKE SURE YOU:   Understand these instructions.  Will watch your condition.  Will get help right away if you are not doing well or get worse. Document Released: 03/30/2000 Document Revised: 08/17/2013 Document Reviewed: 11/14/2011 [facility] Patient Information 2015 [place], [place]. This information is not intended to replace advice given to you by your health care provider. Make sure you discuss any questions you have with your health care provider.  Hypertension Hypertension, commonly called high blood pressure, is when the force of blood pumping through your arteries is too strong. Your arteries are the blood vessels that carry blood from your heart throughout your body. A blood pressure reading consists of a higher number over a lower number, such as 110/72. The higher number (systolic) is the pressure inside your arteries when your heart pumps. The lower number (diastolic) is the pressure inside your arteries when your heart relaxes. Ideally you want your blood pressure below 120/80. Hypertension forces your heart to work harder to pump blood. Your arteries may become narrow or stiff. Having hypertension puts you at risk for heart disease, stroke,  and other problems.  RISK FACTORS Some risk factors for high blood pressure are controllable. Others are not.  Risk factors you cannot control include:   Race. You may be at higher risk if you are African American.  Age. Risk increases with age.  Gender. Men are at higher risk than women before age 67 years. After age 42, women are at higher risk than men. Risk factors you can control include:  Not getting enough exercise or physical activity.  Being overweight.  Getting too much fat, sugar, calories, or salt in your diet.  Drinking too much alcohol. SIGNS AND SYMPTOMS Hypertension does not  usually cause signs or symptoms. Extremely high blood pressure (hypertensive crisis) may cause headache, anxiety, shortness of breath, and nosebleed. DIAGNOSIS  To check if you have hypertension, your health care provider will measure your blood pressure while you are seated, with your arm held at the level of your heart. It should be measured at least twice using the same arm. Certain conditions can cause a difference in blood pressure between your right and left arms. A blood pressure reading that is higher than normal on one occasion does not mean that you need treatment. If one blood pressure reading is high, ask your health care provider about having it checked again. TREATMENT  Treating high blood pressure includes making lifestyle changes and possibly taking medicine. Living a healthy lifestyle can help lower high blood pressure. You may need to change some of your habits. Lifestyle changes may include:  Following the DASH diet. This diet is high in fruits, vegetables, and whole grains. It is low in salt, red meat, and added sugars.  Getting at least 2 hours of brisk physical activity every week.  Losing weight if necessary.  Not smoking.  Limiting alcoholic beverages.  Learning ways to reduce stress. If lifestyle changes are not enough to get your blood pressure under control, your health care provider may prescribe medicine. You may need to take more than one. Work closely with your health care provider to understand the risks and benefits. HOME CARE INSTRUCTIONS  Have your blood pressure rechecked as directed by your health care provider.   Take medicines only as directed by your health care provider. Follow the directions carefully. Blood pressure medicines must be taken as prescribed. The medicine does not work as well when you skip doses. Skipping doses also puts you at risk for problems.   Do not smoke.   Monitor your blood pressure at home as directed by your health  care provider. SEEK MEDICAL CARE IF:   You think you are having a reaction to medicines taken.  You have recurrent headaches or feel dizzy.  You have swelling in your ankles.  You have trouble with your vision. SEEK IMMEDIATE MEDICAL CARE IF:  You develop a severe headache or confusion.  You have unusual weakness, numbness, or feel faint.  You have severe chest or abdominal pain.  You vomit repeatedly.  You have trouble breathing. MAKE SURE YOU:   Understand these instructions.  Will watch your condition.  Will get help right away if you are not doing well or get worse. Document Released: 04/02/2005 Document Revised: 08/17/2013 Document Reviewed: 01/23/2013 [facility] Patient Information 2015 [place], [place]. This information is not intended to replace advice given to you by your health care provider. Make sure you discuss any questions you have with your health care provider.

## 2014-08-04 NOTE — Progress Notes (Signed)
Subjective:    Patient ID: Philip [name], male    DOB: Mar 10, 1961, 54 y.o.   MRN: [id number]  HPI Comments: 54 y.o pmh tobacco abuse, seizures, elevated BP  He presents for f/u. He is w/ wife who provides some history as well  1. C/o right foot/ankle pain and swelling constant since 03/2014.  Initially was in the top of his foot then progressed to his right ankle now.  Pain now is 10+ radiating up his lower leg.  He states yesterday his ankle was red and more swollen and he experienced sweating, chills, vomiting due to pain.  He did not feel like he had a fever.  Pain is constant and throbs.  His ankle jt feels tight and at times feels like it make get stuck in certain movements.  He tried the boot per [facility] but had to take it off b/c boot was making ankle more painful.  He has had to walk on crutches.  He also had tried steroids (Prednisone 40 mg x 7 days), antibiotics (Keflex and Bactrim thinking possibly cellulitis) in the past w/o relief.  Even ice/heat is not providing relief.  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  He was seen at sports medicine 4/5 they considered chrondral defect/loose body in the ankle and MRI was ordered  (see results below).  He called today [facility] for appt but only [facility] location was available and pt chose to come to [facility] due to unreliability of his mom's car and he is traveling from [place]. He had Xray 06/21/14 foot and ankle negative with soft tissue swelling with calcaneal spur. 3/18 Xray foot and ankle with no evidence of fracture or dislocation. Mild diffuse osteopenia of visualized osseous structures.  No evidence of fracture or dislocation. 07/30/14 MRI Dominant finding is multifocal marrow edema which is nonspecific but likely related to stress change. No discrete fracture is identified. Much less likely differential consideration is crystal or inflammatory arthropathy. Extensive associated soft tissue edema is present. Negative for ligament or tendon tear.  Per last  note he bleeds with NSAIDS   ROS as above.    2. Elevated BP-BP 162/106>152/110 will repeat but has been elevated in the past. Not on any medications for BP.  Currently pt in pain. Will have f/u in 1 month if still elevated consider medications      Review of Systems  Gastrointestinal:       H/o bloody diarrhea with NSAIDS       Objective:   Physical Exam  Constitutional: He is oriented to person, place, and time. He appears well-developed and well-nourished.  Mild to mod distress 2/2 pain  HENT:  Head: Normocephalic and atraumatic.  Eyes: Conjunctivae are normal.  Cardiovascular: Regular rhythm.  Tachycardia present.   Musculoskeletal:       Right ankle: He exhibits decreased range of motion and swelling. He exhibits normal pulse. Tenderness. Lateral malleolus and medial malleolus tenderness found.       Feet:  Neurological: He is alert and oriented to person, place, and time.  Walking on crutches 2/2 pain  Skin: Skin is warm, dry and intact.  Psychiatric: He has a normal mood and affect. His speech is normal and behavior is normal. Judgment and thought content normal. Cognition and memory are normal.          Assessment & Plan:  Of note Dr. Dareen Piano saw the patient on exam today  F/u in 1 month BP check and  if elevated put on meds  F/u in 1-2 weeks prn ankle with [facility] or [facility] or ortho

## 2014-08-05 ENCOUNTER — Encounter: Payer: Self-pay | Admitting: *Deleted

## 2014-08-05 DIAGNOSIS — M79671 Pain in right foot: Secondary | ICD-10-CM

## 2014-08-05 NOTE — Patient Instructions (Signed)
[facility] Dr. Kelle Darting 08/12/14 at [place], [place], [place] [address] [phone]

## 2014-08-05 NOTE — Addendum Note (Signed)
Addended by: Cyd Silence on: 08/05/2014 03:43 PM   Modules accepted: Orders

## 2014-08-06 NOTE — Progress Notes (Signed)
INTERNAL MEDICINE TEACHING ATTENDING ADDENDUM - Aldine Contes, MD: I personally saw and evaluated Mr. Pilch in this clinic visit in conjunction with the resident, Dr. Aundra Dubin. I have discussed patient's plan of care with medical resident during this visit. I have confirmed the physical exam findings and have read and agree with the clinic note including the plan with the following addition: - Patient with tenderness, mild erythema and swelling around right ankle - Patient will need referral back to sports medicine for possible arthrocentesis - Unlikely infectious as it has been an ongoing process for months.  - Will attempt empiric treatment with colchicine - If no improvement will consider MRI

## 2014-08-19 ENCOUNTER — Encounter: Payer: Self-pay | Admitting: Internal Medicine

## 2014-08-19 ENCOUNTER — Ambulatory Visit (INDEPENDENT_AMBULATORY_CARE_PROVIDER_SITE_OTHER): Payer: No Typology Code available for payment source | Admitting: Internal Medicine

## 2014-08-19 VITALS — BP 164/93 | HR 94 | Temp 97.4°F | Ht 70.0 in | Wt 171.1 lb

## 2014-08-19 DIAGNOSIS — M25571 Pain in right ankle and joints of right foot: Secondary | ICD-10-CM | POA: Diagnosis not present

## 2014-08-19 MED ORDER — HYDROCODONE-ACETAMINOPHEN 5-325 MG PO TABS: 1.0000 | ORAL_TABLET | Freq: Four times a day (QID) | ORAL | Status: DC | PRN

## 2014-08-19 NOTE — Assessment & Plan Note (Addendum)
Likely 2/2 to acute gout attack.  Pain since 03/2014 on right foot, with radiation now to the leg.   as tried steroid and keflex in the past without relief. Seen sports medicine 4/5, MRI ordered which showed the following:  Dominant finding is multifocal marrow edema which is nonspecific but likely related to stress change. No discrete fracture is identified. Much less likely differential consideration is crystal or inflammatory arthropathy. Extensive associated soft tissue edema is present.  Negative for ligament or tendon tear.  Went to Dr. Sharol Given for paracentesis, but he was convince this is gout based on MRI pattern of Talar edema. Did glucocorticoid intraarticular injection to the ankle, did not do paracentesis. Continues to have pain but swelling reduced. Was on colchichine before but Dr. Sharol Given sent script for meloxicam.   I called Dr. Jess Barters office and made appt for 7:45 am tomorrow 08/20/14. Will likely benefit from another steroid injection. He is unable to go to work due to the pain.  Continued meloxicam, asked to stop taking colchicine. Gave him another short supply of norco 5-325 mg q6hr for 7 days (28 tabs).  Will need allopurinol in the future to prevent future gout attacks after the acute episode resolves.   No fever or other systemic signs of infection. Septic arthritis less likely. Asked him to call us if he gets a fever or has any other changes.

## 2014-08-19 NOTE — Progress Notes (Signed)
Subjective:    Patient ID: Philip [name], male    DOB: 1960/12/01, 54 y.o.   MRN: [id number]  HPI  54 yo male with hx of elevated BP, remote hx of seizures when Philip [name] was 20's, and right foot pain here for follow up.  Please see Dr. Claris Gladden note from from 08/04/14 for details.  Philip [name] basically started having this pain on 03/2014, on the right foot, with some radiation now to the ankle. Has tried steroid and keflex in the past without relief. Seen sports medicine 4/5, MRI ordered which showed the following:  Dominant finding is multifocal marrow edema which is nonspecific but likely related to stress change. No discrete fracture is identified. Much less likely differential consideration is crystal or inflammatory arthropathy. Extensive associated soft tissue edema is present.  Negative for ligament or tendon tear.  Last visit Philip [name] was referred to sports medicine for arthrocentesis, who then referred him to ortho for arthrocentesis. Dr. Aundra Dubin also gave him a trial of colchicine, norco 5-'325mg'$  q6prn #56 tabs if pain not controlled with colchicine.   Today Philip [name] comes with continuation of his right ankle pain with some radiation upto his right leg. Philip [name] saw Dr. Sharol Given on 08/12/14. I called their office and asked the receptionist to read over his dictation. It seems Philip [name] was very convince this is gout based on the pattern of Talar bone edema on MRI. Philip [name] did glucocorticoid injection to the ankle and asked pt to f/up in 4 weeks. Philip [name] did not do an arthrocentesis.   Patient states his swelling has reduced but still has swelling, however the pain did not improve at all. Has been taking colchicine BID and his hydrocodone without any improvement of the pain. Dr. Jess Barters office prescribed Meloxicam which Philip [name] picked up but has not start taking because Philip [name] was not sure which one to take.   I called Dr. Jess Barters office to make appointment urgently, Philip [name] will see Dr. Sharol Given 08/20/14 7:45 am. Will likely benefit from another  injection. Also asked him to stop taking colchicine and start the meloxicam. Will need allopurinol in the future to prevent future gout attacks.   Denies any fever, chills,sob/chest pain, rashes. Has some nausea when the pain gets worse, no other symptoms. Is not able to go to work because of the pain.    Review of Systems  Constitutional: Negative for fever, chills and fatigue.  HENT: Negative for rhinorrhea and sore throat.   Eyes: Negative for visual disturbance.  Respiratory: Negative for cough, chest tightness and shortness of breath.   Cardiovascular: Negative for chest pain and palpitations.  Gastrointestinal: Positive for nausea. Negative for vomiting, abdominal pain and abdominal distention.  Genitourinary: Negative for dysuria and flank pain.  Musculoskeletal: Positive for joint swelling and arthralgias. Negative for back pain and gait problem.  Skin: Negative for rash.  Neurological: Negative.   Hematological: Negative.   Psychiatric/Behavioral: Negative.       Objective:   Physical Exam  Constitutional: Philip [name] is oriented to person, place, and time. Philip [name] appears well-developed and well-nourished. Philip [name] appears distressed.  Pleasant male, appears to be in pain.  HENT:  Head: Normocephalic and atraumatic.  Mouth/Throat: Oropharynx is clear and moist. No oropharyngeal exudate.  Eyes: Conjunctivae are normal. Pupils are equal, round, and reactive to light. No scleral icterus.  Cardiovascular: Normal rate, regular rhythm, normal heart sounds and intact distal pulses.  Exam reveals no gallop and no friction rub.   No murmur heard. Pulmonary/Chest: Effort normal and breath  sounds normal. No respiratory distress. Philip [name] has no wheezes. Philip [name] has no rales. Philip [name] exhibits no tenderness.  Abdominal: Soft. Bowel sounds are normal. Philip [name] exhibits no distension and no mass. There is no tenderness. There is no rebound and no guarding.  Musculoskeletal:  Pulses normal on both foot. Right ankle significantly  more swollen than left. No erythema, mild warm on right foot. No open skin lesions. Has tenderness on the right ankle and right foot. Tenderness with plantar and dorsi flexion of right ankle. Pain is worse medially around the medial malleolus.   Neurological: Philip [name] is alert and oriented to person, place, and time. No cranial nerve deficit. Coordination normal.  Skin: No rash noted. Philip [name] is not diaphoretic. No erythema.    Filed Vitals:   08/19/14 1556  BP: 164/93  Pulse: 94  Temp: 97.4 F (36.3 C)       Assessment & Plan:  See problem based a&p.

## 2014-08-19 NOTE — Patient Instructions (Addendum)
I made an appointment for you to see Dr. Sharol Given urgently tomorrow at 7:45 AM.  Stop taking the colchicine. Start taking the Meloxicam as Dr. Sharol Given suggested.  Follow up with [place] after you see him if your symptoms don't improve.  Gave you short supply of Noroc 28 tablets.

## 2014-08-20 NOTE — Progress Notes (Signed)
INTERNAL MEDICINE TEACHING ATTENDING ADDENDUM - Aldine Contes, MD: I reviewed and discussed at the time of visit with the resident Dr. Genene Churn, the patient's medical history, physical examination, diagnosis and results of pertinent tests and treatment and I agree with the patient's care as documented.  *Patient had gone to see Dr. Sharol Given for an arthrocentesis not a paracentesis.

## 2014-08-24 ENCOUNTER — Other Ambulatory Visit: Payer: Self-pay

## 2014-08-24 ENCOUNTER — Encounter
Admission: EM | Disposition: A | Discharge status: Other Acute Inpatient Hospital | Payer: No Typology Code available for payment source | Source: Home / Self Care | Attending: Cardiology

## 2014-08-24 ENCOUNTER — Encounter: Payer: Self-pay | Admitting: Emergency Medicine

## 2014-08-24 ENCOUNTER — Encounter (HOSPITAL_COMMUNITY): Payer: Self-pay | Admitting: Thoracic Surgery (Cardiothoracic Vascular Surgery)

## 2014-08-24 ENCOUNTER — Emergency Department: Payer: No Typology Code available for payment source

## 2014-08-24 ENCOUNTER — Inpatient Hospital Stay
Admission: EM | Admit: 2014-08-24 | Discharge: 2014-08-24 | DRG: 282 | Disposition: A | Discharge status: Other Acute Inpatient Hospital | Payer: No Typology Code available for payment source | Attending: Cardiology | Admitting: Cardiology

## 2014-08-24 ENCOUNTER — Inpatient Hospital Stay (HOSPITAL_COMMUNITY)
Admission: AD | Admit: 2014-08-24 | Discharge: 2014-09-01 | DRG: 236 | Disposition: A | Discharge status: Home / Self Care | Payer: No Typology Code available for payment source | Source: Other Acute Inpatient Hospital | Attending: Thoracic Surgery (Cardiothoracic Vascular Surgery) | Admitting: Thoracic Surgery (Cardiothoracic Vascular Surgery)

## 2014-08-24 ENCOUNTER — Inpatient Hospital Stay (HOSPITAL_COMMUNITY): Payer: No Typology Code available for payment source

## 2014-08-24 DIAGNOSIS — I25110 Atherosclerotic heart disease of native coronary artery with unstable angina pectoris: Secondary | ICD-10-CM | POA: Diagnosis present

## 2014-08-24 DIAGNOSIS — D62 Acute posthemorrhagic anemia: Secondary | ICD-10-CM | POA: Diagnosis not present

## 2014-08-24 DIAGNOSIS — M109 Gout, unspecified: Secondary | ICD-10-CM | POA: Diagnosis present

## 2014-08-24 DIAGNOSIS — I25119 Atherosclerotic heart disease of native coronary artery with unspecified angina pectoris: Secondary | ICD-10-CM

## 2014-08-24 DIAGNOSIS — Z79899 Other long term (current) drug therapy: Secondary | ICD-10-CM

## 2014-08-24 DIAGNOSIS — Z7982 Long term (current) use of aspirin: Secondary | ICD-10-CM | POA: Diagnosis not present

## 2014-08-24 DIAGNOSIS — I2119 ST elevation (STEMI) myocardial infarction involving other coronary artery of inferior wall: Secondary | ICD-10-CM

## 2014-08-24 DIAGNOSIS — R079 Chest pain, unspecified: Secondary | ICD-10-CM | POA: Diagnosis present

## 2014-08-24 DIAGNOSIS — J9811 Atelectasis: Secondary | ICD-10-CM | POA: Diagnosis not present

## 2014-08-24 DIAGNOSIS — Z888 Allergy status to other drugs, medicaments and biological substances status: Secondary | ICD-10-CM | POA: Diagnosis not present

## 2014-08-24 DIAGNOSIS — F17210 Nicotine dependence, cigarettes, uncomplicated: Secondary | ICD-10-CM | POA: Diagnosis present

## 2014-08-24 DIAGNOSIS — I10 Essential (primary) hypertension: Secondary | ICD-10-CM | POA: Diagnosis present

## 2014-08-24 DIAGNOSIS — M79671 Pain in right foot: Secondary | ICD-10-CM

## 2014-08-24 DIAGNOSIS — Z91041 Radiographic dye allergy status: Secondary | ICD-10-CM

## 2014-08-24 DIAGNOSIS — J40 Bronchitis, not specified as acute or chronic: Secondary | ICD-10-CM | POA: Diagnosis not present

## 2014-08-24 DIAGNOSIS — Z886 Allergy status to analgesic agent status: Secondary | ICD-10-CM

## 2014-08-24 DIAGNOSIS — Z8249 Family history of ischemic heart disease and other diseases of the circulatory system: Secondary | ICD-10-CM

## 2014-08-24 DIAGNOSIS — I252 Old myocardial infarction: Secondary | ICD-10-CM | POA: Diagnosis present

## 2014-08-24 DIAGNOSIS — I213 ST elevation (STEMI) myocardial infarction of unspecified site: Principal | ICD-10-CM | POA: Diagnosis present

## 2014-08-24 DIAGNOSIS — M25571 Pain in right ankle and joints of right foot: Secondary | ICD-10-CM

## 2014-08-24 DIAGNOSIS — Z79891 Long term (current) use of opiate analgesic: Secondary | ICD-10-CM | POA: Diagnosis not present

## 2014-08-24 DIAGNOSIS — I2510 Atherosclerotic heart disease of native coronary artery without angina pectoris: Secondary | ICD-10-CM | POA: Insufficient documentation

## 2014-08-24 DIAGNOSIS — E8770 Fluid overload, unspecified: Secondary | ICD-10-CM | POA: Diagnosis not present

## 2014-08-24 DIAGNOSIS — Z951 Presence of aortocoronary bypass graft: Secondary | ICD-10-CM

## 2014-08-24 HISTORY — PX: CARDIAC CATHETERIZATION: SHX172

## 2014-08-24 LAB — ABO/RH: ABO/RH(D): A POS

## 2014-08-24 LAB — CBC
HCT: 36.8 % — ABNORMAL LOW (ref 40.0–52.0)
Hemoglobin: 11.4 g/dL — ABNORMAL LOW (ref 13.0–18.0)
MCH: 23 pg — ABNORMAL LOW (ref 26.0–34.0)
MCHC: 30.9 g/dL — AB (ref 32.0–36.0)
MCV: 74.5 fL — ABNORMAL LOW (ref 80.0–100.0)
Platelets: 321 10*3/uL (ref 150–440)
RBC: 4.94 MIL/uL (ref 4.40–5.90)
RDW: 23.2 % — AB (ref 11.5–14.5)
WBC: 14.2 10*3/uL — ABNORMAL HIGH (ref 3.8–10.6)

## 2014-08-24 LAB — BASIC METABOLIC PANEL
ANION GAP: 12 (ref 5–15)
BUN: 10 mg/dL (ref 6–20)
CHLORIDE: 105 mmol/L (ref 101–111)
CO2: 20 mmol/L — ABNORMAL LOW (ref 22–32)
CREATININE: 1.26 mg/dL — AB (ref 0.61–1.24)
Calcium: 9.3 mg/dL (ref 8.9–10.3)
GFR calc Af Amer: >60 mL/min (ref >60)
GFR calc non Af Amer: >60 mL/min (ref >60)
Glucose, Bld: 133 mg/dL — ABNORMAL HIGH (ref 65–99)
Potassium: 3.6 mmol/L (ref 3.5–5.1)
SODIUM: 137 mmol/L (ref 135–145)

## 2014-08-24 LAB — GLUCOSE, CAPILLARY
GLUCOSE-CAPILLARY: 153 mg/dL — AB (ref 70–99)
Glucose-Capillary: 119 mg/dL — ABNORMAL HIGH (ref 70–99)

## 2014-08-24 LAB — SURGICAL PCR SCREEN
MRSA, PCR: NEGATIVE
Staphylococcus aureus: NEGATIVE

## 2014-08-24 LAB — PROTIME-INR
INR: 0.9
INR: 1 (ref 0.00–1.49)
Prothrombin Time: 12.4 seconds (ref 11.4–15.0)
Prothrombin Time: 13.3 seconds (ref 11.6–15.2)

## 2014-08-24 LAB — TROPONIN I: Troponin I: 0.41 ng/mL — ABNORMAL HIGH (ref <0.031)

## 2014-08-24 LAB — APTT: APTT: 25 s (ref 24–36)

## 2014-08-24 LAB — HEPARIN LEVEL (UNFRACTIONATED): Heparin Unfractionated: 0.18 IU/mL — ABNORMAL LOW (ref 0.30–0.70)

## 2014-08-24 SURGERY — LEFT HEART CATH AND CORONARY ANGIOGRAPHY
Anesthesia: Moderate Sedation

## 2014-08-24 MED ORDER — CHLORHEXIDINE GLUCONATE CLOTH 2 % EX PADS
6.0000 | MEDICATED_PAD | Freq: Once | CUTANEOUS | Status: AC
  Administered 2014-08-25: 6 via TOPICAL

## 2014-08-24 MED ORDER — HEPARIN SODIUM (PORCINE) 5000 UNIT/ML IJ SOLN
4000.0000 [IU] | Freq: Once | INTRAMUSCULAR | Status: AC
  Administered 2014-08-24: 4000 [IU] via INTRAVENOUS

## 2014-08-24 MED ORDER — MAGNESIUM SULFATE 50 % IJ SOLN
40.0000 meq | INTRAMUSCULAR | Status: DC
  Filled 2014-08-24: qty 10

## 2014-08-24 MED ORDER — ACETAMINOPHEN 325 MG PO TABS: 650.0000 mg | ORAL_TABLET | ORAL | Status: DC | PRN

## 2014-08-24 MED ORDER — NITROGLYCERIN IN D5W 200-5 MCG/ML-% IV SOLN: 0.0000 ug/min | INTRAVENOUS | Status: DC

## 2014-08-24 MED ORDER — DIAZEPAM 5 MG PO TABS
5.0000 mg | ORAL_TABLET | Freq: Once | ORAL | Status: AC
  Administered 2014-08-25: 5 mg via ORAL
  Filled 2014-08-24: qty 1

## 2014-08-24 MED ORDER — NITROGLYCERIN 0.4 MG SL SUBL: 0.4000 mg | SUBLINGUAL_TABLET | SUBLINGUAL | Status: DC | PRN

## 2014-08-24 MED ORDER — ALPRAZOLAM 0.25 MG PO TABS: 0.2500 mg | ORAL_TABLET | ORAL | Status: DC | PRN

## 2014-08-24 MED ORDER — DIPHENHYDRAMINE HCL 50 MG/ML IJ SOLN
INTRAMUSCULAR | Status: DC | PRN
  Administered 2014-08-24: 50 mg via INTRAVENOUS

## 2014-08-24 MED ORDER — CHLORHEXIDINE GLUCONATE CLOTH 2 % EX PADS
6.0000 | MEDICATED_PAD | Freq: Once | CUTANEOUS | Status: AC
  Administered 2014-08-24: 6 via TOPICAL

## 2014-08-24 MED ORDER — DEXTROSE 5 % IV SOLN
30.0000 ug/min | INTRAVENOUS | Status: AC
  Administered 2014-08-25: 50 ug/min via INTRAVENOUS
  Filled 2014-08-24: qty 2

## 2014-08-24 MED ORDER — IOPAMIDOL (ISOVUE-300) INJECTION 61%
INTRAVENOUS | Status: DC | PRN
  Administered 2014-08-24: 130 mL
  Administered 2014-08-24: 30 mL

## 2014-08-24 MED ORDER — HEPARIN (PORCINE) IN NACL 100-0.45 UNIT/ML-% IJ SOLN
900.0000 [IU]/h | INTRAMUSCULAR | Status: DC
  Filled 2014-08-24: qty 250

## 2014-08-24 MED ORDER — MIDAZOLAM HCL 2 MG/2ML IJ SOLN
INTRAMUSCULAR | Status: DC | PRN
  Administered 2014-08-24: 1 mg via INTRAVENOUS

## 2014-08-24 MED ORDER — DEXMEDETOMIDINE HCL IN NACL 400 MCG/100ML IV SOLN
0.1000 ug/kg/h | INTRAVENOUS | Status: AC
  Administered 2014-08-25: .3 ug/kg/h via INTRAVENOUS
  Filled 2014-08-24 (×2): qty 100

## 2014-08-24 MED ORDER — NITROGLYCERIN 5 MG/ML IV SOLN
INTRAVENOUS | Status: AC
  Filled 2014-08-24: qty 10

## 2014-08-24 MED ORDER — NITROGLYCERIN IN D5W 200-5 MCG/ML-% IV SOLN
INTRAVENOUS | Status: DC | PRN
  Administered 2014-08-24: 10 ug/min via INTRAVENOUS

## 2014-08-24 MED ORDER — EPINEPHRINE HCL 1 MG/ML IJ SOLN
0.0000 ug/min | INTRAVENOUS | Status: DC
  Filled 2014-08-24: qty 4

## 2014-08-24 MED ORDER — SODIUM CHLORIDE 0.9 % IV SOLN
INTRAVENOUS | Status: AC
  Administered 2014-08-25: 69.8 mL/h via INTRAVENOUS
  Filled 2014-08-24: qty 40

## 2014-08-24 MED ORDER — MIDAZOLAM HCL 2 MG/2ML IJ SOLN
INTRAMUSCULAR | Status: AC
  Filled 2014-08-24: qty 2

## 2014-08-24 MED ORDER — FENTANYL CITRATE (PF) 100 MCG/2ML IJ SOLN
INTRAMUSCULAR | Status: DC | PRN
  Administered 2014-08-24: 50 ug via INTRAVENOUS

## 2014-08-24 MED ORDER — SODIUM CHLORIDE 0.9 % IV SOLN
1250.0000 mg | INTRAVENOUS | Status: AC
  Administered 2014-08-25: 1250 mg via INTRAVENOUS
  Filled 2014-08-24: qty 1250

## 2014-08-24 MED ORDER — NITROGLYCERIN IN D5W 200-5 MCG/ML-% IV SOLN
2.0000 ug/min | INTRAVENOUS | Status: DC
Start: 2014-08-24 — End: 2014-08-25
  Filled 2014-08-24: qty 250

## 2014-08-24 MED ORDER — SODIUM CHLORIDE 0.9 % IV SOLN
INTRAVENOUS | Status: DC | PRN
  Administered 2014-08-24: 100 mL/h via INTRAVENOUS

## 2014-08-24 MED ORDER — BIVALIRUDIN 250 MG IV SOLR
INTRAVENOUS | Status: AC
  Filled 2014-08-24: qty 250

## 2014-08-24 MED ORDER — FENTANYL CITRATE (PF) 100 MCG/2ML IJ SOLN
INTRAMUSCULAR | Status: AC
  Filled 2014-08-24: qty 2

## 2014-08-24 MED ORDER — BISACODYL 5 MG PO TBEC
5.0000 mg | DELAYED_RELEASE_TABLET | Freq: Once | ORAL | Status: AC
  Administered 2014-08-24: 5 mg via ORAL
  Filled 2014-08-24: qty 1

## 2014-08-24 MED ORDER — SODIUM CHLORIDE 0.9 % WEIGHT BASED INFUSION: 1.0000 mL/kg/h | INTRAVENOUS | Status: DC

## 2014-08-24 MED ORDER — ONDANSETRON HCL 4 MG/2ML IJ SOLN: 4.0000 mg | Freq: Four times a day (QID) | INTRAMUSCULAR | Status: DC | PRN

## 2014-08-24 MED ORDER — POTASSIUM CHLORIDE 2 MEQ/ML IV SOLN
80.0000 meq | INTRAVENOUS | Status: DC
  Filled 2014-08-24: qty 40

## 2014-08-24 MED ORDER — NITROGLYCERIN IN D5W 200-5 MCG/ML-% IV SOLN
INTRAVENOUS | Status: DC | PRN
  Administered 2014-08-24: 15 ug/min via INTRAVENOUS

## 2014-08-24 MED ORDER — SODIUM CHLORIDE 0.9 % IV SOLN
INTRAVENOUS | Status: DC
  Filled 2014-08-24: qty 30

## 2014-08-24 MED ORDER — METHYLPREDNISOLONE SODIUM SUCC 125 MG IJ SOLR
INTRAMUSCULAR | Status: AC
  Filled 2014-08-24: qty 2

## 2014-08-24 MED ORDER — HEPARIN (PORCINE) IN NACL 100-0.45 UNIT/ML-% IJ SOLN
1100.0000 [IU]/h | INTRAMUSCULAR | Status: DC
  Administered 2014-08-24: 900 [IU]/h via INTRAVENOUS
  Filled 2014-08-24: qty 250

## 2014-08-24 MED ORDER — ALLOPURINOL 100 MG PO TABS
100.0000 mg | ORAL_TABLET | Freq: Two times a day (BID) | ORAL | Status: DC
  Administered 2014-08-24: 100 mg via ORAL
  Filled 2014-08-24 (×3): qty 1

## 2014-08-24 MED ORDER — METOPROLOL TARTRATE 12.5 MG HALF TABLET
12.5000 mg | ORAL_TABLET | Freq: Once | ORAL | Status: AC
  Administered 2014-08-25: 12.5 mg via ORAL
  Filled 2014-08-24: qty 1

## 2014-08-24 MED ORDER — DEXTROSE 5 % IV SOLN
750.0000 mg | INTRAVENOUS | Status: DC
  Filled 2014-08-24: qty 750

## 2014-08-24 MED ORDER — DIPHENHYDRAMINE HCL 25 MG PO CAPS: 25.0000 mg | ORAL_CAPSULE | Freq: Four times a day (QID) | ORAL | Status: DC | PRN

## 2014-08-24 MED ORDER — HYDROCODONE-ACETAMINOPHEN 5-325 MG PO TABS: 1.0000 | ORAL_TABLET | Freq: Four times a day (QID) | ORAL | Status: DC | PRN

## 2014-08-24 MED ORDER — SODIUM CHLORIDE 0.9 % IJ SOLN: 3.0000 mL | INTRAMUSCULAR | Status: DC | PRN

## 2014-08-24 MED ORDER — DEXTROSE 5 % IV SOLN
1.5000 g | INTRAVENOUS | Status: AC
  Administered 2014-08-25: 1.5 g via INTRAVENOUS
  Administered 2014-08-25: .75 g via INTRAVENOUS
  Filled 2014-08-24: qty 1.5

## 2014-08-24 MED ORDER — PLASMA-LYTE 148 IV SOLN
INTRAVENOUS | Status: AC
  Administered 2014-08-25: 500 mL
  Filled 2014-08-24: qty 2.5

## 2014-08-24 MED ORDER — HEPARIN (PORCINE) IN NACL 2-0.9 UNIT/ML-% IJ SOLN
INTRAMUSCULAR | Status: DC | PRN
  Administered 2014-08-24: 1000 [IU]/h via INTRAVENOUS

## 2014-08-24 MED ORDER — NITROGLYCERIN IN D5W 200-5 MCG/ML-% IV SOLN
2.0000 ug/min | INTRAVENOUS | Status: DC
  Filled 2014-08-24: qty 250

## 2014-08-24 MED ORDER — SODIUM CHLORIDE 0.9 % IJ SOLN: 3.0000 mL | Freq: Two times a day (BID) | INTRAMUSCULAR | Status: DC

## 2014-08-24 MED ORDER — INSULIN ASPART 100 UNIT/ML ~~LOC~~ SOLN: 0.0000 [IU] | Freq: Three times a day (TID) | SUBCUTANEOUS | Status: DC

## 2014-08-24 MED ORDER — METHYLPREDNISOLONE SODIUM SUCC 125 MG IJ SOLR
INTRAMUSCULAR | Status: DC | PRN
  Administered 2014-08-24: 60 mg via INTRAVENOUS

## 2014-08-24 MED ORDER — DOPAMINE-DEXTROSE 3.2-5 MG/ML-% IV SOLN
0.0000 ug/kg/min | INTRAVENOUS | Status: DC
  Filled 2014-08-24: qty 250

## 2014-08-24 MED ORDER — TIROFIBAN HCL IV 5 MG/100ML
0.1500 ug/kg/min | INTRAVENOUS | Status: DC
  Administered 2014-08-24: 0.15 ug/kg/min via INTRAVENOUS
  Filled 2014-08-24: qty 100

## 2014-08-24 MED ORDER — TIROFIBAN (AGGRASTAT) BOLUS VIA INFUSION
INTRAVENOUS | Status: DC | PRN
  Administered 2014-08-24: 1940 ug via INTRAVENOUS

## 2014-08-24 MED ORDER — SODIUM CHLORIDE 0.9 % IV SOLN
INTRAVENOUS | Status: AC
  Administered 2014-08-25: 1.1 [IU]/h via INTRAVENOUS
  Filled 2014-08-24: qty 2.5

## 2014-08-24 MED ORDER — ASPIRIN EC 325 MG PO TBEC: 325.0000 mg | DELAYED_RELEASE_TABLET | Freq: Every day | ORAL | Status: DC

## 2014-08-24 MED ORDER — SODIUM CHLORIDE 0.9 % IV SOLN: 250.0000 mL | INTRAVENOUS | Status: DC | PRN

## 2014-08-24 MED ORDER — TIROFIBAN HCL IV 5 MG/100ML
INTRAVENOUS | Status: DC | PRN
  Administered 2014-08-24: 0.15 ug/kg/min via INTRAVENOUS

## 2014-08-24 MED ORDER — OXYCODONE-ACETAMINOPHEN 5-325 MG PO TABS: 1.0000 | ORAL_TABLET | ORAL | Status: DC | PRN

## 2014-08-24 MED ORDER — COLCHICINE 0.6 MG PO TABS
0.6000 mg | ORAL_TABLET | Freq: Two times a day (BID) | ORAL | Status: DC
  Administered 2014-08-25 – 2014-09-01 (×14): 0.6 mg via ORAL
  Filled 2014-08-24 (×18): qty 1

## 2014-08-24 MED ORDER — DIPHENHYDRAMINE HCL 50 MG/ML IJ SOLN
INTRAMUSCULAR | Status: AC
  Filled 2014-08-24: qty 1

## 2014-08-24 MED ORDER — HEPARIN (PORCINE) IN NACL 100-0.45 UNIT/ML-% IJ SOLN
INTRAMUSCULAR | Status: AC
  Filled 2014-08-24: qty 250

## 2014-08-24 MED ORDER — TEMAZEPAM 15 MG PO CAPS
15.0000 mg | ORAL_CAPSULE | Freq: Once | ORAL | Status: AC | PRN
  Administered 2014-08-25: 15 mg via ORAL
  Filled 2014-08-24: qty 1

## 2014-08-24 MED ORDER — MORPHINE SULFATE 2 MG/ML IJ SOLN: 2.0000 mg | INTRAMUSCULAR | Status: DC | PRN

## 2014-08-24 SURGICAL SUPPLY — 11 items
CATH INFINITI 5FR ANG PIGTAIL (CATHETERS) ×3 IMPLANT
CATH INFINITI 5FR JL4 (CATHETERS) ×3 IMPLANT
CATH INFINITI JR4 5F (CATHETERS) IMPLANT
CATH VISTA GUIDE 6FR JR4 SH (CATHETERS) ×3 IMPLANT
DEVICE CLOSURE MYNXGRIP 6/7F (Vascular Products) ×3 IMPLANT
DEVICE INFLAT 30 PLUS (MISCELLANEOUS) ×3 IMPLANT
KIT MANI 3VAL PERCEP (MISCELLANEOUS) ×3 IMPLANT
NEEDLE PERC 18GX7CM (NEEDLE) ×3 IMPLANT
PACK CARDIAC CATH (CUSTOM PROCEDURE TRAY) ×3 IMPLANT
SHEATH AVANTI 6FR X 11CM (SHEATH) ×3 IMPLANT
WIRE EMERALD 3MM-J .035X150CM (WIRE) ×3 IMPLANT

## 2014-08-24 NOTE — H&P (Signed)
Philip [name] is an 54 y.o. male.    REFERRING MD- Dr. Isaias Cowman Chief Complaint: Chest pain HPI: 54 yo man with a strong family history of CAD and ongoing tobacco abuse presents with  Acute onset of CP today while at work.  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 He was at work when he had sudden onset of substernal chest pain. This radiated to the left axilla and arm. HE had a sense of dread adn became diaphoretic. Taken to [facility]- not initially a STEMI, but converted on arrival. Taken to cath lab emergently. Had severe 2 vessel CAD.   Pain resolved post admission. Now no significant pain on heparin, aggrastat and nitroglycerin drips.  Past Medical History  Diagnosis Date  . Foot pain   . ST elevation myocardial infarction (STEMI) of inferior wall 08/24/2014  . Coronary artery disease involving native coronary artery of native heart with unstable angina pectoris 08/24/2014    No past surgical history on file.   Family History + CAD multiple family members- brother, sister, mother  Social History:  reports that he has been smoking Cigarettes.  He has been smoking about 1.00 pack per day. He does not have any smokeless tobacco history on file. He reports that he drinks alcohol. He reports that he does not use illicit drugs.  Allergies:  Allergies  Allergen Reactions  . Contrast Media [Iodinated Diagnostic Agents] Other (See Comments)    Pt states that he got "knots behind his ears".  . Nsaids Other (See Comments)    Reaction:  Blood in stool     Medications Prior to Admission  Medication Sig Dispense Refill  . allopurinol (ZYLOPRIM) 100 MG tablet Take 100 mg by mouth 2 (two) times daily.    . colchicine 0.6 MG tablet Take 1.2 mg (2 pills) then 12 hours later take 0.6 mg (1 pill).  Then take 0.6 mg 2x per day for up to 6 months (Patient not taking: Reported on 08/24/2014) 31 tablet 1  . diphenhydrAMINE (BENADRYL) 25 mg capsule Take 25 mg by mouth every 6 (six) hours  as needed for allergies.    Marland Kitchen HYDROcodone-acetaminophen (NORCO/VICODIN) 5-325 MG per tablet Take 1 tablet by mouth every 6 (six) hours as needed for moderate pain. 28 tablet 0  . meloxicam (MOBIC) 7.5 MG tablet Take 7.5 mg by mouth 2 (two) times daily as needed for pain.     Marland Kitchen omeprazole (PRILOSEC OTC) 20 MG tablet Take 20 mg by mouth daily.      Results for orders placed or performed during the hospital encounter of 08/24/14 (from the past 48 hour(s))  Basic metabolic panel     Status: Abnormal   Collection Time: 08/24/14 12:20 PM  Result Value Ref Range   Sodium 137 135 - 145 mmol/L   Potassium 3.6 3.5 - 5.1 mmol/L   Chloride 105 101 - 111 mmol/L   CO2 20 (L) 22 - 32 mmol/L   Glucose, Bld 133 (H) 65 - 99 mg/dL   BUN 10 6 - 20 mg/dL   Creatinine, Ser 1.26 (H) 0.61 - 1.24 mg/dL   Calcium 9.3 8.9 - 10.3 mg/dL   GFR calc non Af Amer >60 >60 mL/min   GFR calc Af Amer >60 >60 mL/min    Comment: (NOTE) The eGFR has been calculated using the CKD EPI equation. This calculation has not been validated in all clinical situations. eGFR's persistently <60 mL/min signify possible Chronic Kidney Disease.  Anion gap 12 5 - 15  Troponin I     Status: Abnormal   Collection Time: 08/24/14 12:20 PM  Result Value Ref Range   Troponin I 0.41 (H) <0.031 ng/mL    Comment: READ BACK AND VERIFIED WITH STEPHANIE WILLY IN CATH LAB @ 1340 08/24/14 [facility]        PERSISTENTLY INCREASED TROPONIN VALUES IN THE RANGE OF 0.04-0.49 ng/mL CAN BE SEEN IN:       -UNSTABLE ANGINA       -CONGESTIVE HEART FAILURE       -MYOCARDITIS       -CHEST TRAUMA       -ARRYHTHMIAS       -LATE PRESENTING MYOCARDIAL INFARCTION       -COPD   CLINICAL FOLLOW-UP RECOMMENDED.   CBC     Status: Abnormal   Collection Time: 08/24/14 12:20 PM  Result Value Ref Range   WBC 14.2 (H) 3.8 - 10.6 K/uL   RBC 4.94 4.40 - 5.90 MIL/uL   Hemoglobin 11.4 (L) 13.0 - 18.0 g/dL   HCT 36.8 (L) 40.0 - 52.0 %   MCV 74.5 (L) 80.0 - 100.0 fL    MCH 23.0 (L) 26.0 - 34.0 pg   MCHC 30.9 (L) 32.0 - 36.0 g/dL   RDW 23.2 (H) 11.5 - 14.5 %   Platelets 321 150 - 440 K/uL  APTT     Status: None   Collection Time: 08/24/14 12:20 PM  Result Value Ref Range   aPTT 25 24 - 36 seconds  Protime-INR     Status: None   Collection Time: 08/24/14 12:20 PM  Result Value Ref Range   Prothrombin Time 12.4 11.4 - 15.0 seconds   INR 0.90   Glucose, capillary     Status: Abnormal   Collection Time: 08/24/14  2:08 PM  Result Value Ref Range   Glucose-Capillary 119 (H) 70 - 99 mg/dL   Dg Chest 1 View  08/24/2014   CLINICAL DATA:  Chest pain.  EXAM: CHEST  1 VIEW  COMPARISON:  None.  FINDINGS: The heart size and mediastinal contours are within normal limits. Both lungs are clear. No pneumothorax or pleural effusion is noted. The visualized skeletal structures are unremarkable.  IMPRESSION: No acute cardiopulmonary abnormality seen.   Electronically Signed   By: Marijo Conception, M.D.   On: 08/24/2014 13:22    Review of Systems  Constitutional: Positive for malaise/fatigue and diaphoresis. Negative for fever and chills.  Respiratory: Positive for shortness of breath.   Cardiovascular: Positive for chest pain.  Musculoskeletal: Positive for joint pain (right ankle).  All other systems reviewed and are negative.   Blood pressure 160/91, pulse 99, temperature 97.9 F (36.6 C), temperature source Oral, resp. rate 13, SpO2 100 %. Physical Exam  Vitals reviewed. Constitutional: He is oriented to person, place, and time. He appears well-developed and well-nourished. No distress.  HENT:  Head: Normocephalic and atraumatic.  Poor dentition  Eyes: EOM are normal. Pupils are equal, round, and reactive to light.  Neck: Neck supple. No thyromegaly present.  No carotid bruits  Cardiovascular: Normal rate, regular rhythm, normal heart sounds and intact distal pulses.  Exam reveals no gallop and no friction rub.   No murmur heard. Respiratory: Effort normal  and breath sounds normal.  GI: Soft. There is no tenderness.  Musculoskeletal: He exhibits no edema.  Lymphadenopathy:    He has no cervical adenopathy.  Neurological: He is alert and oriented to person, place, and  time. No cranial nerve deficit.  Skin: Skin is warm and dry.     Assessment/Plan 54 yo man with severe 2 vessel CAD, s/p STEMI. Needs CABG for survival benefit and relief of symptoms.  I discussed the general nature of the procedure, the need for general anesthesia, and the incisions to be used with the patient. I discussed the expected hospital stay, overall recovery and short and long term outcomes. I reviewed the indications, risks, benefits and alternatives. He understands the risks include, but are not limited to death, stroke, MI, DVT/PE, bleeding, possible need for transfusion, infections, cardiac arrhythmias, and other organ system dysfunction including respiratory, renal, or GI complications. He accepts the risks and agrees to proceed.  Plan OR 1st case in AM.  If he develops recurrent pain overnight may need to go to OR emergently  Melrose Nakayama 08/24/2014, 6:06 PM

## 2014-08-24 NOTE — ED Notes (Signed)
Ems pt , possible Stemi ,from work moving "cans" sudden onset left arm radiating across chest, SHOB, pt arrives moaning and groaning holding his chest

## 2014-08-24 NOTE — Progress Notes (Signed)
   08/24/14 1355  Clinical Encounter Type  Visited With Patient  Visit Type Follow-up  Referral From Chaplain  Spiritual Encounters  Spiritual Needs Emotional  Stress Factors  Patient Stress Factors Health changes  Chaplain followed up with patient after his code stemi and transfer to unit. Provided a compassionate presence and support as applicable. Chaplain Dak Szumski A. Shalonda Sachse Ext. [phone]

## 2014-08-24 NOTE — ED Notes (Signed)
STEMI  CALL  TO  [facility]  AT 12:25PM  DR PARASCHOS  ON  CALL

## 2014-08-24 NOTE — Progress Notes (Signed)
Pre-op Cardiac Surgery  Carotid Findings:  Findings suggest 1-39% internal carotid artery stenosis bilaterally. Vertebral arteries are patent with antegrade flow.   Upper Extremity Right Left  Brachial Pressures 162-Triphasic 153-Triphasic  Radial Waveforms Triphasic Triphasic  Ulnar Waveforms Triphasic Triphasic  Palmar Arch (Allen's Test) Signal obliterates with radial compression, is unaffected with ulnar compression. Signal obliterates with both radial and ulnar compression.   Findings:   Palpable pedal pulses bilaterally.  08/24/2014 7:24 PM Maudry Mayhew, RVT, RDCS, RDMS

## 2014-08-24 NOTE — ED Provider Notes (Signed)
[facility] Emergency Department Provider Note  ____________________________________________  Time seen: On arrival at 12:15 PM  I have reviewed the triage vital signs and the nursing notes.   HISTORY  Chief Complaint Code STEMI    HPI Philip [name] is a 54 y.o. male who reports sudden onset of left-sided chest pain radiating down the left arm and to the right side of the chest associated with shortness of breath, diaphoresis and vomiting. 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 He is unable to state exactly what time or how long that the symptoms started, but was just prior to calling EMS and prior to arrival. He denies any prior medical history except for gout. The pain is tightness and pressure, 10 out of 10, not positional. He is unable to relate any other associates symptoms or aggravating or alleviating factors. It's constant since onset. He was given 1 nitroglycerin spray by EMS, which did not improve the pain. EMS reports they did multiple EKGs prior to arrival and although there was some slight elevation in the inferior leads, they were not able to determine with confidence. It was a STEMI and did not activate the catheter lab.     Past Medical History  Diagnosis Date  . Foot pain     Patient Active Problem List   Diagnosis Date Noted  . Right ankle pain 07/20/2014  . Right foot pain 07/06/2014  . Elevated blood pressure 07/06/2014  . Tobacco abuse 07/06/2014  . Seizures 07/06/2014  . Preventative health care 07/06/2014    History reviewed. No pertinent past surgical history.  Current Outpatient Rx  Name  Route  Sig  Dispense  Refill  . colchicine 0.6 MG tablet      Take 1.2 mg (2 pills) then 12 hours later take 0.6 mg (1 pill).  Then take 0.6 mg 2x per day for up to 6 months   31 tablet   1   . HYDROcodone-acetaminophen (NORCO/VICODIN) 5-325 MG per tablet   Oral   Take 1 tablet by mouth every 6 (six) hours  as needed for moderate pain.   28 tablet   0   . meloxicam (MOBIC) 7.5 MG tablet   Oral   Take 7.5 mg by mouth 2 (two) times daily as needed.           Allergies Contrast media and Ibuprofen  History reviewed. No pertinent family history.  Social History History  Substance Use Topics  . Smoking status: Current Every Day Smoker -- 1.00 packs/day    Types: Cigarettes  . Smokeless tobacco: Not on file  . Alcohol Use: 0.0 oz/week    0 Standard drinks or equivalent per week     Comment: Sometimes on w/e.    Review of Systems  Constitutional: No fever or chills. No weight changes Eyes:No blurry vision or double vision.  ENT: No sore throat. Cardiovascular: Chest pain, shortness of breath as above  Respiratory: No cough. Gastrointestinal: Vomiting as above. No abdominal pain or diarrhea.  No BRBPR or melena. Genitourinary: Negative for dysuria, urinary retention, bloody urine, or difficulty urinating. Musculoskeletal: Negative for back pain. No joint swelling or pain. Skin: Negative for rash. Neurological: Negative for headaches, focal weakness or numbness. Psychiatric:No anxiety or depression.   Endocrine:No hot/cold intolerance, changes in energy, or sleep difficulty.  10-point ROS otherwise negative.  ____________________________________________   PHYSICAL EXAM:  VITAL SIGNS: ED Triage Vitals  Enc Vitals Group     BP  08/24/14 1226 157/101 mmHg     Pulse Rate 08/24/14 1226 108     Resp 08/24/14 1226 24     Temp 08/24/14 1226 97.5 F (36.4 C)     Temp Source 08/24/14 1226 Oral     SpO2 08/24/14 1217 99 %     Weight 08/24/14 1226 171 lb (77.565 kg)     Height 08/24/14 1226 '5\' 10"'$  (1.778 m)     Head Cir --      Peak Flow --      Pain Score 08/24/14 1227 10     Pain Loc --      Pain Edu? --      Excl. in [place]? --      Constitutional: Alert and oriented. Severe distress Eyes: No scleral icterus. No conjunctival pallor. PERRL. EOMI ENT   Head:  Normocephalic and atraumatic.   Nose: No congestion/rhinnorhea. No septal hematoma   Mouth/Throat: MMM, no pharyngeal erythema   Neck: No stridor. No SubQ emphysema.  Hematological/Lymphatic/Immunilogical: No cervical lymphadenopathy. Cardiovascular: RRR. Normal and symmetric distal pulses are present in all extremities. No murmurs, rubs, or gallops. Respiratory: Normal respiratory effort without tachypnea nor retractions. Breath sounds are clear and equal bilaterally. No wheezes/rales/rhonchi. Gastrointestinal: Soft and nontender. No distention. There is no CVA tenderness.  No rebound, rigidity, or guarding. Genitourinary: deferred Musculoskeletal: Nontender with normal range of motion in all extremities. No joint effusions.  No lower extremity tenderness.  No edema. Neurologic:   Normal speech and language.  CN 2-10 normal. Motor grossly intact. No pronator drift.  Normal gait. No gross focal neurologic deficits are appreciated.  Skin: Diaphoretic Psychiatric: Mood and affect are normal. Speech and behavior are normal. Patient exhibits appropriate insight and judgment.  ____________________________________________    LABS (pertinent positives/negatives) (all labs ordered are listed, but only abnormal results are displayed) Labs Reviewed - No data to display ____________________________________________   EKG  Normal sinus rhythm with 1-2 mm ST elevation in 2, 3 and aVF. There is 1 mm ST depression in V2, which likely will likely represents a reciprocal change. Right-sided EKG in V4 and V5. Leads are unremarkable  ____________________________________________    RADIOLOGY    ____________________________________________   PROCEDURES CRITICAL CARE Performed by: Joni Fears, Wallice [place]   Total critical care time:30 minutes  Critical care time was exclusive of separately billable procedures and treating other patients.  Critical care was necessary to treat or prevent  imminent or life-threatening deterioration.  Critical care was time spent personally by me on the following activities: development of treatment plan with patient and/or surrogate as well as nursing, discussions with consultants, evaluation of patient's response to treatment, examination of patient, obtaining history from patient or surrogate, ordering and performing treatments and interventions, ordering and review of laboratory studies, ordering and review of radiographic studies, pulse oximetry and re-evaluation of patient's condition.  ____________________________________________   INITIAL IMPRESSION / ASSESSMENT AND PLAN / ED COURSE  Pertinent labs & imaging results that were available during my care of the patient were reviewed by me and considered in my medical decision making (see chart for details).  The patient presents with a STEMI. Code STEMI called at 12:25 PM. Patient received aspirin 324 by EMS. No evidence of RV involvement. On EKG, so I gave him sublingual nitroglycerin and heparin 4000 units IV.  ----------------------------------------- 12:49 PM on 08/24/2014 -----------------------------------------  Patient out of the department, in the Cath Lab with cardiology.  ____________________________________________   FINAL CLINICAL IMPRESSION(S) / ED DIAGNOSES  Final  diagnoses:  None   Acute ST elevation myocardial infarction Acute chest pain   Carrie Mew, MD 08/24/14 1250

## 2014-08-24 NOTE — Progress Notes (Signed)
Pt to be transferred to [facility] room 10.  Report called and given to [facility] RN.  Report also given to [facility].

## 2014-08-24 NOTE — Progress Notes (Signed)
Pt transferred to [facility] for CABG.  Pt alert and oriented, no complaints of pain on nitro drip, nsr, lungs clear on 2Lnc.  Pt remains npo, VSS, afebrile.  Pt has NS, Aggrastat, Heparin, and Nitroglycerin infusing iv.  Pt transferred to [facility] 2 Heart room 10, transported via [place].  Report given to [facility], rn receiving pt.  Pt family aware of transfer.

## 2014-08-24 NOTE — Progress Notes (Signed)
ANTICOAGULATION CONSULT NOTE - Initial Consult  Pharmacy Consult for Heparin Drip Indication: chest pain/ACS  Allergies  Allergen Reactions  . Contrast Media [Iodinated Diagnostic Agents]   . Ibuprofen Other (See Comments)    Blood in stools    Patient Measurements: Height: '5\' 10"'$  (177.8 cm) Weight: 171 lb (77.565 kg) IBW/kg (Calculated) : 73 Heparin Dosing Weight: 77.6 kg  Vital Signs: Temp: 97.5 F (36.4 C) (05/10 1226) Temp Source: Oral (05/10 1226) BP: 148/87 mmHg (05/10 1500) Pulse Rate: 99 (05/10 1500)  Labs:  Recent Labs  08/24/14 1220  HGB 11.4*  HCT 36.8*  PLT 321  APTT 25  LABPROT 12.4  INR 0.90  CREATININE 1.26*  TROPONINI 0.41*    Estimated Creatinine Clearance: 69.2 mL/min (by C-G formula based on Cr of 1.26).   Medical History: Past Medical History  Diagnosis Date  . Foot pain     Medications:  Scheduled:  . aspirin EC  325 mg Oral Daily  . sodium chloride  3 mL Intravenous Q12H   Infusions:  . sodium chloride 1 mL/kg/hr (08/24/14 1434)  . heparin 900 Units/hr (08/24/14 1434)    Assessment: Patient is a 54 yo male admitted with chest pain/ACS.  Heparin drip initiated in addition Aggrastat drip.   Goal of Therapy:  Heparin level 0.3-0.5 units/ml (on Aggrastat drip) Monitor platelets by anticoagulation protocol: Yes   Plan:  Give 4000 units bolus x 1  Will start heparin drip at rate of 900 units/hr.  Will order anti-Xa level and CBC in 6 hours after drip initiation at 2030.  Murrell Converse, PharmD Clinical Pharmacist 08/24/2014

## 2014-08-24 NOTE — Progress Notes (Signed)
ANTICOAGULATION CONSULT NOTE - Initial Consult  Pharmacy Consult for Heparin Indication: 2V CAD, for CABG in am  Allergies  Allergen Reactions  . Contrast Media [Iodinated Diagnostic Agents] Other (See Comments)    Pt states that he got "knots behind his ears".  . Nsaids Other (See Comments)    Reaction:  Blood in stool     Patient Measurements: Height: '5\' 10"'$  (177.8 cm) Weight: 171 lb (77.565 kg) IBW/kg (Calculated) : 73 Heparin Dosing Weight: 77.5 kg  Vital Signs: Temp: 97.9 F (36.6 C) (05/10 1700) Temp Source: Oral (05/10 1700) BP: 157/87 mmHg (05/10 1800) Pulse Rate: 94 (05/10 1800)  Labs:  Recent Labs  08/24/14 1220  HGB 11.4*  HCT 36.8*  PLT 321  APTT 25  LABPROT 12.4  INR 0.90  CREATININE 1.26*  TROPONINI 0.41*    Estimated Creatinine Clearance: 69.2 mL/min (by C-G formula based on Cr of 1.26).   Medical History: Past Medical History  Diagnosis Date  . Foot pain   . ST elevation myocardial infarction (STEMI) of inferior wall 08/24/2014  . Coronary artery disease involving native coronary artery of native heart with unstable angina pectoris 08/24/2014   Assessment:   54 yr old male transferred to [facility] from [facility] after STEMI today, urgent cath. Severe 2V CAD, for CABG in am.    Heparin has been running at 900 units/hr since 2:30pm today.    Also on Aggrastat at 0.15 mcg/kg/min since ~2pm today.  Goal of Therapy:  Heparin level 0.3-0.5 units/ml Monitor platelets by anticoagulation protocol: Yes   Plan:   Continue heparin drip at 900 units/hr.  Heparin level tonight.  Heparin to stop on call to OR in am.  Aggrastat to stop 4 hr prep-op.  Arty Baumgartner, [place] Pager: [phone] 08/24/2014,6:57 PM

## 2014-08-25 ENCOUNTER — Encounter (HOSPITAL_COMMUNITY): Payer: Self-pay | Admitting: Certified Registered"

## 2014-08-25 ENCOUNTER — Inpatient Hospital Stay (HOSPITAL_COMMUNITY): Payer: No Typology Code available for payment source

## 2014-08-25 ENCOUNTER — Encounter (HOSPITAL_COMMUNITY)
Admission: AD | Disposition: A | Discharge status: Home / Self Care | Payer: No Typology Code available for payment source | Source: Other Acute Inpatient Hospital | Attending: Thoracic Surgery (Cardiothoracic Vascular Surgery)

## 2014-08-25 ENCOUNTER — Inpatient Hospital Stay (HOSPITAL_COMMUNITY): Payer: No Typology Code available for payment source | Admitting: Certified Registered"

## 2014-08-25 DIAGNOSIS — Z951 Presence of aortocoronary bypass graft: Secondary | ICD-10-CM

## 2014-08-25 HISTORY — PX: CORONARY ARTERY BYPASS GRAFT: SHX141

## 2014-08-25 HISTORY — PX: TEE WITHOUT CARDIOVERSION: SHX5443

## 2014-08-25 LAB — POCT I-STAT, CHEM 8
BUN: 5 mg/dL — ABNORMAL LOW (ref 6–20)
BUN: 6 mg/dL (ref 6–20)
BUN: 5 mg/dL — ABNORMAL LOW (ref 6–20)
BUN: 6 mg/dL (ref 6–20)
BUN: 6 mg/dL (ref 6–20)
BUN: 5 mg/dL — ABNORMAL LOW (ref 6–20)
CALCIUM ION: 1.1 mmol/L — AB (ref 1.12–1.23)
CHLORIDE: 101 mmol/L (ref 101–111)
CHLORIDE: 103 mmol/L (ref 101–111)
CREATININE: 0.6 mg/dL — AB (ref 0.61–1.24)
CREATININE: 0.7 mg/dL (ref 0.61–1.24)
Calcium, Ion: 1.29 mmol/L — ABNORMAL HIGH (ref 1.12–1.23)
Calcium, Ion: 1.3 mmol/L — ABNORMAL HIGH (ref 1.12–1.23)
Calcium, Ion: 1.16 mmol/L (ref 1.12–1.23)
Calcium, Ion: 1.18 mmol/L (ref 1.12–1.23)
Calcium, Ion: 1.2 mmol/L (ref 1.12–1.23)
Chloride: 106 mmol/L (ref 101–111)
Chloride: 105 mmol/L (ref 101–111)
Chloride: 98 mmol/L — ABNORMAL LOW (ref 101–111)
Chloride: 107 mmol/L (ref 101–111)
Creatinine, Ser: 0.6 mg/dL — ABNORMAL LOW (ref 0.61–1.24)
Creatinine, Ser: 0.5 mg/dL — ABNORMAL LOW (ref 0.61–1.24)
Creatinine, Ser: 0.6 mg/dL — ABNORMAL LOW (ref 0.61–1.24)
Creatinine, Ser: 0.7 mg/dL (ref 0.61–1.24)
GLUCOSE: 117 mg/dL — AB (ref 70–99)
Glucose, Bld: 113 mg/dL — ABNORMAL HIGH (ref 70–99)
Glucose, Bld: 127 mg/dL — ABNORMAL HIGH (ref 70–99)
Glucose, Bld: 110 mg/dL — ABNORMAL HIGH (ref 70–99)
Glucose, Bld: 135 mg/dL — ABNORMAL HIGH (ref 70–99)
Glucose, Bld: 145 mg/dL — ABNORMAL HIGH (ref 70–99)
HCT: 24 % — ABNORMAL LOW (ref 39.0–52.0)
HCT: 20 % — ABNORMAL LOW (ref 39.0–52.0)
HCT: 23 % — ABNORMAL LOW (ref 39.0–52.0)
HCT: 22 % — ABNORMAL LOW (ref 39.0–52.0)
HEMATOCRIT: 25 % — AB (ref 39.0–52.0)
HEMATOCRIT: 20 % — AB (ref 39.0–52.0)
HEMOGLOBIN: 6.8 g/dL — AB (ref 13.0–17.0)
Hemoglobin: 8.5 g/dL — ABNORMAL LOW (ref 13.0–17.0)
Hemoglobin: 8.2 g/dL — ABNORMAL LOW (ref 13.0–17.0)
Hemoglobin: 6.8 g/dL — CL (ref 13.0–17.0)
Hemoglobin: 7.8 g/dL — ABNORMAL LOW (ref 13.0–17.0)
Hemoglobin: 7.5 g/dL — ABNORMAL LOW (ref 13.0–17.0)
POTASSIUM: 4.3 mmol/L (ref 3.5–5.1)
POTASSIUM: 4.9 mmol/L (ref 3.5–5.1)
POTASSIUM: 4.2 mmol/L (ref 3.5–5.1)
Potassium: 4.1 mmol/L (ref 3.5–5.1)
Potassium: 4.3 mmol/L (ref 3.5–5.1)
Potassium: 3.7 mmol/L (ref 3.5–5.1)
SODIUM: 139 mmol/L (ref 135–145)
SODIUM: 133 mmol/L — AB (ref 135–145)
SODIUM: 137 mmol/L (ref 135–145)
Sodium: 138 mmol/L (ref 135–145)
Sodium: 136 mmol/L (ref 135–145)
Sodium: 140 mmol/L (ref 135–145)
TCO2: 22 mmol/L (ref 0–100)
TCO2: 21 mmol/L (ref 0–100)
TCO2: 21 mmol/L (ref 0–100)
TCO2: 21 mmol/L (ref 0–100)
TCO2: 21 mmol/L (ref 0–100)
TCO2: 20 mmol/L (ref 0–100)

## 2014-08-25 LAB — COMPREHENSIVE METABOLIC PANEL
ALBUMIN: 2.9 g/dL — AB (ref 3.5–5.0)
ALT: 25 U/L (ref 17–63)
ANION GAP: 11 (ref 5–15)
AST: 67 U/L — ABNORMAL HIGH (ref 15–41)
Alkaline Phosphatase: 63 U/L (ref 38–126)
BUN: 6 mg/dL (ref 6–20)
CALCIUM: 8.9 mg/dL (ref 8.9–10.3)
CO2: 21 mmol/L — ABNORMAL LOW (ref 22–32)
Chloride: 107 mmol/L (ref 101–111)
Creatinine, Ser: 0.68 mg/dL (ref 0.61–1.24)
GFR calc non Af Amer: >60 mL/min (ref >60)
GLUCOSE: 121 mg/dL — AB (ref 70–99)
Potassium: 3.9 mmol/L (ref 3.5–5.1)
Sodium: 139 mmol/L (ref 135–145)
Total Bilirubin: 0.6 mg/dL (ref 0.3–1.2)
Total Protein: 5.9 g/dL — ABNORMAL LOW (ref 6.5–8.1)

## 2014-08-25 LAB — POCT I-STAT 3, ART BLOOD GAS (G3+)
ACID-BASE DEFICIT: 4 mmol/L — AB (ref 0.0–2.0)
ACID-BASE DEFICIT: 3 mmol/L — AB (ref 0.0–2.0)
Acid-base deficit: 4 mmol/L — ABNORMAL HIGH (ref 0.0–2.0)
Acid-base deficit: 5 mmol/L — ABNORMAL HIGH (ref 0.0–2.0)
BICARBONATE: 21.7 meq/L (ref 20.0–24.0)
Bicarbonate: 22.8 mEq/L (ref 20.0–24.0)
Bicarbonate: 21.7 mEq/L (ref 20.0–24.0)
Bicarbonate: 21.2 mEq/L (ref 20.0–24.0)
O2 SAT: 100 %
O2 SAT: 99 %
O2 Saturation: 96 %
O2 Saturation: 92 %
PCO2 ART: 43.8 mmHg (ref 35.0–45.0)
PCO2 ART: 42.9 mmHg (ref 35.0–45.0)
PCO2 ART: 43.7 mmHg (ref 35.0–45.0)
PH ART: 7.292 — AB (ref 7.350–7.450)
PO2 ART: 84 mmHg (ref 80.0–100.0)
PO2 ART: 68 mmHg — AB (ref 80.0–100.0)
Patient temperature: 36.3
Patient temperature: 36.4
TCO2: 23 mmol/L (ref 0–100)
TCO2: 24 mmol/L (ref 0–100)
TCO2: 23 mmol/L (ref 0–100)
TCO2: 23 mmol/L (ref 0–100)
pCO2 arterial: 40.5 mmHg (ref 35.0–45.0)
pH, Arterial: 7.338 — ABNORMAL LOW (ref 7.350–7.450)
pH, Arterial: 7.322 — ABNORMAL LOW (ref 7.350–7.450)
pH, Arterial: 7.308 — ABNORMAL LOW (ref 7.350–7.450)
pO2, Arterial: 378 mmHg — ABNORMAL HIGH (ref 80.0–100.0)
pO2, Arterial: 167 mmHg — ABNORMAL HIGH (ref 80.0–100.0)

## 2014-08-25 LAB — CBC
HCT: 29.4 % — ABNORMAL LOW (ref 39.0–52.0)
HEMATOCRIT: 24.1 % — AB (ref 39.0–52.0)
HEMATOCRIT: 23.3 % — AB (ref 39.0–52.0)
HEMOGLOBIN: 7.3 g/dL — AB (ref 13.0–17.0)
HEMOGLOBIN: 6.9 g/dL — AB (ref 13.0–17.0)
Hemoglobin: 8.9 g/dL — ABNORMAL LOW (ref 13.0–17.0)
MCH: 21.5 pg — AB (ref 26.0–34.0)
MCH: 21.5 pg — ABNORMAL LOW (ref 26.0–34.0)
MCH: 21.2 pg — ABNORMAL LOW (ref 26.0–34.0)
MCHC: 30.3 g/dL (ref 30.0–36.0)
MCHC: 30.3 g/dL (ref 30.0–36.0)
MCHC: 29.6 g/dL — ABNORMAL LOW (ref 30.0–36.0)
MCV: 71 fL — ABNORMAL LOW (ref 78.0–100.0)
MCV: 71.1 fL — AB (ref 78.0–100.0)
MCV: 71.5 fL — AB (ref 78.0–100.0)
Platelets: 283 10*3/uL (ref 150–400)
Platelets: 183 10*3/uL (ref 150–400)
Platelets: 172 10*3/uL (ref 150–400)
RBC: 4.14 MIL/uL — ABNORMAL LOW (ref 4.22–5.81)
RBC: 3.39 MIL/uL — ABNORMAL LOW (ref 4.22–5.81)
RBC: 3.26 MIL/uL — ABNORMAL LOW (ref 4.22–5.81)
RDW: 21.5 % — AB (ref 11.5–15.5)
RDW: 21.1 % — ABNORMAL HIGH (ref 11.5–15.5)
RDW: 21.4 % — ABNORMAL HIGH (ref 11.5–15.5)
WBC: 9.6 10*3/uL (ref 4.0–10.5)
WBC: 14 10*3/uL — AB (ref 4.0–10.5)
WBC: 9.1 10*3/uL (ref 4.0–10.5)

## 2014-08-25 LAB — CREATININE, SERUM: Creatinine, Ser: 0.68 mg/dL (ref 0.61–1.24)

## 2014-08-25 LAB — BLOOD GAS, ARTERIAL
Acid-base deficit: 2.6 mmol/L — ABNORMAL HIGH (ref 0.0–2.0)
BICARBONATE: 20.8 meq/L (ref 20.0–24.0)
Drawn by: 42624
FIO2: 0.21 %
O2 Saturation: 92.5 %
Patient temperature: 98.6
TCO2: 21.8 mmol/L (ref 0–100)
pCO2 arterial: 30.6 mmHg — ABNORMAL LOW (ref 35.0–45.0)
pH, Arterial: 7.448 (ref 7.350–7.450)
pO2, Arterial: 66.1 mmHg — ABNORMAL LOW (ref 80.0–100.0)

## 2014-08-25 LAB — URINALYSIS, ROUTINE W REFLEX MICROSCOPIC
BILIRUBIN URINE: NEGATIVE
Glucose, UA: NEGATIVE mg/dL
Hgb urine dipstick: NEGATIVE
KETONES UR: NEGATIVE mg/dL
LEUKOCYTES UA: NEGATIVE
NITRITE: NEGATIVE
PROTEIN: NEGATIVE mg/dL
Specific Gravity, Urine: 1.008 (ref 1.005–1.030)
UROBILINOGEN UA: 0.2 mg/dL (ref 0.0–1.0)
pH: 5.5 (ref 5.0–8.0)

## 2014-08-25 LAB — MAGNESIUM: Magnesium: 3.1 mg/dL — ABNORMAL HIGH (ref 1.7–2.4)

## 2014-08-25 LAB — GLUCOSE, CAPILLARY
GLUCOSE-CAPILLARY: 114 mg/dL — AB (ref 70–99)
GLUCOSE-CAPILLARY: 99 mg/dL (ref 70–99)
Glucose-Capillary: 95 mg/dL (ref 70–99)
Glucose-Capillary: 95 mg/dL (ref 70–99)
Glucose-Capillary: 122 mg/dL — ABNORMAL HIGH (ref 70–99)

## 2014-08-25 LAB — HEMOGLOBIN AND HEMATOCRIT, BLOOD
HEMATOCRIT: 20.6 % — AB (ref 39.0–52.0)
Hemoglobin: 6.1 g/dL — CL (ref 13.0–17.0)

## 2014-08-25 LAB — PREPARE RBC (CROSSMATCH)

## 2014-08-25 LAB — POCT I-STAT 4, (NA,K, GLUC, HGB,HCT)
Glucose, Bld: 119 mg/dL — ABNORMAL HIGH (ref 70–99)
HEMATOCRIT: 26 % — AB (ref 39.0–52.0)
Hemoglobin: 8.8 g/dL — ABNORMAL LOW (ref 13.0–17.0)
Potassium: 4.3 mmol/L (ref 3.5–5.1)
Sodium: 138 mmol/L (ref 135–145)

## 2014-08-25 LAB — PROTIME-INR
INR: 1.27 (ref 0.00–1.49)
PROTHROMBIN TIME: 16 s — AB (ref 11.6–15.2)

## 2014-08-25 LAB — PLATELET COUNT: Platelets: 170 10*3/uL (ref 150–400)

## 2014-08-25 LAB — APTT: aPTT: 31 seconds (ref 24–37)

## 2014-08-25 SURGERY — CORONARY ARTERY BYPASS GRAFTING (CABG)
Anesthesia: General | Site: Chest

## 2014-08-25 MED ORDER — SODIUM CHLORIDE 0.9 % IJ SOLN
OROMUCOSAL | Status: DC | PRN
  Administered 2014-08-25 (×3): 4 mL via TOPICAL

## 2014-08-25 MED ORDER — ACETAMINOPHEN 650 MG RE SUPP
650.0000 mg | Freq: Once | RECTAL | Status: AC
  Administered 2014-08-25: 650 mg via RECTAL

## 2014-08-25 MED ORDER — KETOROLAC TROMETHAMINE 15 MG/ML IJ SOLN
15.0000 mg | Freq: Four times a day (QID) | INTRAMUSCULAR | Status: DC
  Administered 2014-08-25 – 2014-08-26 (×3): 15 mg via INTRAVENOUS
  Filled 2014-08-25 (×7): qty 1

## 2014-08-25 MED ORDER — ACETAMINOPHEN 500 MG PO TABS
1000.0000 mg | ORAL_TABLET | Freq: Four times a day (QID) | ORAL | Status: AC
  Administered 2014-08-25 – 2014-08-30 (×14): 1000 mg via ORAL
  Filled 2014-08-25 (×21): qty 2

## 2014-08-25 MED ORDER — FENTANYL CITRATE (PF) 250 MCG/5ML IJ SOLN
INTRAMUSCULAR | Status: AC
  Filled 2014-08-25: qty 5

## 2014-08-25 MED ORDER — SODIUM CHLORIDE 0.9 % IV SOLN: 250.0000 mL | INTRAVENOUS | Status: DC

## 2014-08-25 MED ORDER — BISACODYL 5 MG PO TBEC
10.0000 mg | DELAYED_RELEASE_TABLET | Freq: Every day | ORAL | Status: DC
  Administered 2014-08-26 – 2014-09-01 (×4): 10 mg via ORAL
  Filled 2014-08-25 (×5): qty 2

## 2014-08-25 MED ORDER — INSULIN REGULAR BOLUS VIA INFUSION
0.0000 [IU] | Freq: Three times a day (TID) | INTRAVENOUS | Status: DC
  Filled 2014-08-25: qty 10

## 2014-08-25 MED ORDER — ROCURONIUM BROMIDE 100 MG/10ML IV SOLN
INTRAVENOUS | Status: DC | PRN
  Administered 2014-08-25 (×2): 50 mg via INTRAVENOUS
  Administered 2014-08-25: 20 mg via INTRAVENOUS
  Administered 2014-08-25: 50 mg via INTRAVENOUS
  Administered 2014-08-25: 30 mg via INTRAVENOUS

## 2014-08-25 MED ORDER — SODIUM CHLORIDE 0.9 % IV SOLN
Freq: Once | INTRAVENOUS | Status: AC
  Administered 2014-08-25: 21:00:00 via INTRAVENOUS

## 2014-08-25 MED ORDER — FAMOTIDINE IN NACL 20-0.9 MG/50ML-% IV SOLN
20.0000 mg | Freq: Two times a day (BID) | INTRAVENOUS | Status: AC
  Administered 2014-08-25 (×2): 20 mg via INTRAVENOUS
  Filled 2014-08-25: qty 50

## 2014-08-25 MED ORDER — ARTIFICIAL TEARS OP OINT
TOPICAL_OINTMENT | OPHTHALMIC | Status: AC
  Filled 2014-08-25: qty 3.5

## 2014-08-25 MED ORDER — SODIUM CHLORIDE 0.9 % IV SOLN
INTRAVENOUS | Status: DC
  Filled 2014-08-25: qty 2.5

## 2014-08-25 MED ORDER — CETYLPYRIDINIUM CHLORIDE 0.05 % MT LIQD
7.0000 mL | Freq: Four times a day (QID) | OROMUCOSAL | Status: DC
  Administered 2014-08-25 – 2014-08-27 (×7): 7 mL via OROMUCOSAL

## 2014-08-25 MED ORDER — DOCUSATE SODIUM 100 MG PO CAPS
200.0000 mg | ORAL_CAPSULE | Freq: Every day | ORAL | Status: DC
Start: 2014-08-26 — End: 2014-09-01
  Administered 2014-08-26 – 2014-09-01 (×5): 200 mg via ORAL
  Filled 2014-08-25 (×7): qty 2

## 2014-08-25 MED ORDER — METOPROLOL TARTRATE 12.5 MG HALF TABLET
12.5000 mg | ORAL_TABLET | Freq: Two times a day (BID) | ORAL | Status: DC
  Administered 2014-08-26 (×2): 12.5 mg via ORAL
  Filled 2014-08-25 (×5): qty 1

## 2014-08-25 MED ORDER — MIDAZOLAM HCL 2 MG/2ML IJ SOLN: 2.0000 mg | INTRAMUSCULAR | Status: DC | PRN

## 2014-08-25 MED ORDER — ALBUMIN HUMAN 5 % IV SOLN
250.0000 mL | INTRAVENOUS | Status: AC | PRN
  Administered 2014-08-25 (×2): 250 mL via INTRAVENOUS

## 2014-08-25 MED ORDER — SODIUM CHLORIDE 0.9 % IJ SOLN
INTRAMUSCULAR | Status: AC
  Filled 2014-08-25: qty 10

## 2014-08-25 MED ORDER — FENTANYL CITRATE (PF) 100 MCG/2ML IJ SOLN
INTRAMUSCULAR | Status: DC | PRN
  Administered 2014-08-25: 50 ug via INTRAVENOUS
  Administered 2014-08-25: 250 ug via INTRAVENOUS
  Administered 2014-08-25: 50 ug via INTRAVENOUS
  Administered 2014-08-25: 150 ug via INTRAVENOUS
  Administered 2014-08-25: 500 ug via INTRAVENOUS
  Administered 2014-08-25: 150 ug via INTRAVENOUS
  Administered 2014-08-25 (×2): 100 ug via INTRAVENOUS
  Administered 2014-08-25: 400 ug via INTRAVENOUS

## 2014-08-25 MED ORDER — SODIUM CHLORIDE 0.9 % IJ SOLN: 3.0000 mL | INTRAMUSCULAR | Status: DC | PRN

## 2014-08-25 MED ORDER — ASPIRIN EC 325 MG PO TBEC
325.0000 mg | DELAYED_RELEASE_TABLET | Freq: Every day | ORAL | Status: DC
  Administered 2014-08-26 – 2014-09-01 (×7): 325 mg via ORAL
  Filled 2014-08-25 (×7): qty 1

## 2014-08-25 MED ORDER — PANTOPRAZOLE SODIUM 40 MG PO TBEC: 40.0000 mg | DELAYED_RELEASE_TABLET | Freq: Every day | ORAL | Status: DC

## 2014-08-25 MED ORDER — SODIUM CHLORIDE 0.9 % IV SOLN
INTRAVENOUS | Status: DC | PRN
  Administered 2014-08-25: 12:00:00 via INTRAVENOUS

## 2014-08-25 MED ORDER — BISACODYL 10 MG RE SUPP: 10.0000 mg | Freq: Every day | RECTAL | Status: DC

## 2014-08-25 MED ORDER — TRAMADOL HCL 50 MG PO TABS
50.0000 mg | ORAL_TABLET | ORAL | Status: DC | PRN
  Administered 2014-08-25 – 2014-08-29 (×8): 100 mg via ORAL
  Filled 2014-08-25 (×8): qty 2

## 2014-08-25 MED ORDER — ROCURONIUM BROMIDE 50 MG/5ML IV SOLN
INTRAVENOUS | Status: AC
  Filled 2014-08-25: qty 1

## 2014-08-25 MED ORDER — DEXMEDETOMIDINE HCL IN NACL 200 MCG/50ML IV SOLN
0.0000 ug/kg/h | INTRAVENOUS | Status: DC
  Filled 2014-08-25: qty 50

## 2014-08-25 MED ORDER — NITROGLYCERIN IN D5W 200-5 MCG/ML-% IV SOLN: 0.0000 ug/min | INTRAVENOUS | Status: DC

## 2014-08-25 MED ORDER — SODIUM CHLORIDE 0.45 % IV SOLN
INTRAVENOUS | Status: DC | PRN
  Administered 2014-08-25: 13:00:00 via INTRAVENOUS

## 2014-08-25 MED ORDER — LACTATED RINGERS IV SOLN: INTRAVENOUS | Status: DC

## 2014-08-25 MED ORDER — PROPOFOL 10 MG/ML IV BOLUS
INTRAVENOUS | Status: DC | PRN
  Administered 2014-08-25: 40 mg via INTRAVENOUS
  Administered 2014-08-25: 50 mg via INTRAVENOUS
  Administered 2014-08-25: 10 mg via INTRAVENOUS

## 2014-08-25 MED ORDER — MAGNESIUM SULFATE 4 GM/100ML IV SOLN
4.0000 g | Freq: Once | INTRAVENOUS | Status: AC
  Administered 2014-08-25: 4 g via INTRAVENOUS
  Filled 2014-08-25: qty 100

## 2014-08-25 MED ORDER — HEPARIN SODIUM (PORCINE) 1000 UNIT/ML IJ SOLN
INTRAMUSCULAR | Status: AC
  Filled 2014-08-25: qty 1

## 2014-08-25 MED ORDER — EPHEDRINE SULFATE 50 MG/ML IJ SOLN
INTRAMUSCULAR | Status: AC
  Filled 2014-08-25: qty 1

## 2014-08-25 MED ORDER — LACTATED RINGERS IV SOLN
INTRAVENOUS | Status: DC | PRN
  Administered 2014-08-25 (×2): via INTRAVENOUS

## 2014-08-25 MED ORDER — ARTIFICIAL TEARS OP OINT
TOPICAL_OINTMENT | OPHTHALMIC | Status: DC | PRN
  Administered 2014-08-25: 1 via OPHTHALMIC

## 2014-08-25 MED ORDER — MORPHINE SULFATE 2 MG/ML IJ SOLN
1.0000 mg | INTRAMUSCULAR | Status: AC | PRN
  Administered 2014-08-25: 4 mg via INTRAVENOUS
  Administered 2014-08-25 (×2): 2 mg via INTRAVENOUS
  Filled 2014-08-25: qty 2

## 2014-08-25 MED ORDER — 0.9 % SODIUM CHLORIDE (POUR BTL) OPTIME
TOPICAL | Status: DC | PRN
  Administered 2014-08-25: 6000 mL

## 2014-08-25 MED ORDER — SODIUM CHLORIDE 0.9 % IJ SOLN
3.0000 mL | Freq: Two times a day (BID) | INTRAMUSCULAR | Status: DC
  Administered 2014-08-26 – 2014-08-27 (×3): 3 mL via INTRAVENOUS

## 2014-08-25 MED ORDER — SODIUM CHLORIDE 0.9 % IV SOLN: INTRAVENOUS | Status: DC

## 2014-08-25 MED ORDER — PHENYLEPHRINE HCL 10 MG/ML IJ SOLN
0.0000 ug/min | INTRAVENOUS | Status: DC
  Filled 2014-08-25: qty 2

## 2014-08-25 MED ORDER — LACTATED RINGERS IV SOLN: 500.0000 mL | Freq: Once | INTRAVENOUS | Status: AC | PRN

## 2014-08-25 MED ORDER — DEXTROSE 5 % IV SOLN
1.5000 g | Freq: Two times a day (BID) | INTRAVENOUS | Status: AC
  Administered 2014-08-25 – 2014-08-27 (×4): 1.5 g via INTRAVENOUS
  Filled 2014-08-25 (×4): qty 1.5

## 2014-08-25 MED ORDER — CHLORHEXIDINE GLUCONATE 0.12 % MT SOLN
15.0000 mL | Freq: Two times a day (BID) | OROMUCOSAL | Status: DC
  Administered 2014-08-25 – 2014-08-27 (×4): 15 mL via OROMUCOSAL
  Filled 2014-08-25 (×6): qty 15

## 2014-08-25 MED ORDER — HEPARIN SODIUM (PORCINE) 1000 UNIT/ML IJ SOLN
INTRAMUSCULAR | Status: DC | PRN
  Administered 2014-08-25: 22000 [IU] via INTRAVENOUS
  Administered 2014-08-25: 2000 [IU] via INTRAVENOUS
  Administered 2014-08-25: 5000 [IU] via INTRAVENOUS

## 2014-08-25 MED ORDER — ALBUMIN HUMAN 5 % IV SOLN
INTRAVENOUS | Status: DC | PRN
  Administered 2014-08-25 (×2): via INTRAVENOUS

## 2014-08-25 MED ORDER — METOPROLOL TARTRATE 25 MG/10 ML ORAL SUSPENSION
12.5000 mg | Freq: Two times a day (BID) | ORAL | Status: DC
  Filled 2014-08-25 (×5): qty 5

## 2014-08-25 MED ORDER — METOPROLOL TARTRATE 1 MG/ML IV SOLN: 2.5000 mg | INTRAVENOUS | Status: DC | PRN

## 2014-08-25 MED ORDER — PNEUMOCOCCAL VAC POLYVALENT 25 MCG/0.5ML IJ INJ: 0.5000 mL | INJECTION | INTRAMUSCULAR | Status: DC | PRN

## 2014-08-25 MED ORDER — ASPIRIN 81 MG PO CHEW
324.0000 mg | CHEWABLE_TABLET | Freq: Every day | ORAL | Status: DC
Start: 2014-08-26 — End: 2014-09-01

## 2014-08-25 MED ORDER — OXYCODONE HCL 5 MG PO TABS
5.0000 mg | ORAL_TABLET | ORAL | Status: DC | PRN
  Administered 2014-08-25: 5 mg via ORAL
  Administered 2014-08-25 – 2014-09-01 (×33): 10 mg via ORAL
  Filled 2014-08-25 (×34): qty 2

## 2014-08-25 MED ORDER — MORPHINE SULFATE 2 MG/ML IJ SOLN
2.0000 mg | INTRAMUSCULAR | Status: DC | PRN
  Administered 2014-08-25 – 2014-08-26 (×9): 4 mg via INTRAVENOUS
  Filled 2014-08-25 (×10): qty 2

## 2014-08-25 MED ORDER — ROCURONIUM BROMIDE 50 MG/5ML IV SOLN
INTRAVENOUS | Status: AC
  Filled 2014-08-25: qty 2

## 2014-08-25 MED ORDER — POTASSIUM CHLORIDE 10 MEQ/50ML IV SOLN: 10.0000 meq | INTRAVENOUS | Status: AC

## 2014-08-25 MED ORDER — VANCOMYCIN HCL IN DEXTROSE 1-5 GM/200ML-% IV SOLN
1000.0000 mg | Freq: Once | INTRAVENOUS | Status: AC
  Administered 2014-08-25: 1000 mg via INTRAVENOUS
  Filled 2014-08-25: qty 200

## 2014-08-25 MED ORDER — ACETAMINOPHEN 160 MG/5ML PO SOLN: 650.0000 mg | Freq: Once | ORAL | Status: AC

## 2014-08-25 MED ORDER — INSULIN ASPART 100 UNIT/ML ~~LOC~~ SOLN
0.0000 [IU] | SUBCUTANEOUS | Status: DC
  Administered 2014-08-25 – 2014-08-26 (×3): 2 [IU] via SUBCUTANEOUS

## 2014-08-25 MED ORDER — PROPOFOL 10 MG/ML IV BOLUS
INTRAVENOUS | Status: AC
  Filled 2014-08-25: qty 20

## 2014-08-25 MED ORDER — PROTAMINE SULFATE 10 MG/ML IV SOLN
INTRAVENOUS | Status: AC
  Filled 2014-08-25: qty 25

## 2014-08-25 MED ORDER — ACETAMINOPHEN 160 MG/5ML PO SOLN
1000.0000 mg | Freq: Four times a day (QID) | ORAL | Status: AC
  Filled 2014-08-25: qty 40

## 2014-08-25 MED ORDER — MIDAZOLAM HCL 10 MG/2ML IJ SOLN
INTRAMUSCULAR | Status: AC
  Filled 2014-08-25: qty 2

## 2014-08-25 MED ORDER — ONDANSETRON HCL 4 MG/2ML IJ SOLN
4.0000 mg | Freq: Four times a day (QID) | INTRAMUSCULAR | Status: DC | PRN
  Administered 2014-08-26 – 2014-08-27 (×2): 4 mg via INTRAVENOUS
  Filled 2014-08-25 (×2): qty 2

## 2014-08-25 MED ORDER — HEMOSTATIC AGENTS (NO CHARGE) OPTIME
TOPICAL | Status: DC | PRN
  Administered 2014-08-25: 1 via TOPICAL

## 2014-08-25 MED ORDER — PROTAMINE SULFATE 10 MG/ML IV SOLN
INTRAVENOUS | Status: DC | PRN
Start: 2014-08-25 — End: 2014-08-25
  Administered 2014-08-25 (×5): 50 mg via INTRAVENOUS

## 2014-08-25 MED ORDER — SUCCINYLCHOLINE CHLORIDE 20 MG/ML IJ SOLN
INTRAMUSCULAR | Status: AC
  Filled 2014-08-25: qty 1

## 2014-08-25 MED ORDER — MIDAZOLAM HCL 5 MG/5ML IJ SOLN
INTRAMUSCULAR | Status: DC | PRN
  Administered 2014-08-25 (×4): 2 mg via INTRAVENOUS
  Administered 2014-08-25 (×2): 1 mg via INTRAVENOUS

## 2014-08-25 SURGICAL SUPPLY — 95 items
BAG DECANTER FOR FLEXI CONT (MISCELLANEOUS) ×4 IMPLANT
BANDAGE ELASTIC 4 VELCRO ST LF (GAUZE/BANDAGES/DRESSINGS) ×4 IMPLANT
BANDAGE ELASTIC 6 VELCRO ST LF (GAUZE/BANDAGES/DRESSINGS) ×4 IMPLANT
BASKET HEART  (ORDER IN 25'S) (MISCELLANEOUS) ×1
BASKET HEART (ORDER IN 25'S) (MISCELLANEOUS) ×1
BASKET HEART (ORDER IN 25S) (MISCELLANEOUS) ×2 IMPLANT
BENZOIN TINCTURE PRP APPL 2/3 (GAUZE/BANDAGES/DRESSINGS) ×4 IMPLANT
BLADE STERNUM SYSTEM 6 (BLADE) ×4 IMPLANT
BLADE SURG 11 STRL SS (BLADE) ×4 IMPLANT
BNDG GAUZE ELAST 4 BULKY (GAUZE/BANDAGES/DRESSINGS) ×4 IMPLANT
CANISTER SUCTION 2500CC (MISCELLANEOUS) ×4 IMPLANT
CANNULA EZ GLIDE AORTIC 21FR (CANNULA) ×4 IMPLANT
CATH CPB KIT HENDRICKSON (MISCELLANEOUS) ×4 IMPLANT
CATH ROBINSON RED A/P 18FR (CATHETERS) ×8 IMPLANT
CATH THORACIC 36FR (CATHETERS) ×4 IMPLANT
CATH THORACIC 36FR RT ANG (CATHETERS) ×4 IMPLANT
CLIP TI MEDIUM 24 (CLIP) IMPLANT
CLIP TI WIDE RED SMALL 24 (CLIP) ×16 IMPLANT
CLOSURE WOUND 1/2 X4 (GAUZE/BANDAGES/DRESSINGS) ×1
CONN 3/8X3/8 GISH STERILE (MISCELLANEOUS) ×4 IMPLANT
CRADLE DONUT ADULT HEAD (MISCELLANEOUS) IMPLANT
DRAPE CARDIOVASCULAR INCISE (DRAPES) ×2
DRAPE SLUSH/WARMER DISC (DRAPES) ×4 IMPLANT
DRAPE SRG 135X102X78XABS (DRAPES) ×2 IMPLANT
DRSG COVADERM 4X14 (GAUZE/BANDAGES/DRESSINGS) ×4 IMPLANT
ELECT REM PT RETURN 9FT ADLT (ELECTROSURGICAL) ×8
ELECTRODE REM PT RTRN 9FT ADLT (ELECTROSURGICAL) ×4 IMPLANT
GAUZE SPONGE 4X4 12PLY STRL (GAUZE/BANDAGES/DRESSINGS) ×8 IMPLANT
GLOVE BIO SURGEON STRL SZ 6 (GLOVE) ×16 IMPLANT
GLOVE BIOGEL PI IND STRL 6 (GLOVE) ×10 IMPLANT
GLOVE BIOGEL PI IND STRL 7.0 (GLOVE) ×12 IMPLANT
GLOVE BIOGEL PI IND STRL 7.5 (GLOVE) ×4 IMPLANT
GLOVE BIOGEL PI INDICATOR 6 (GLOVE) ×10
GLOVE BIOGEL PI INDICATOR 7.0 (GLOVE) ×12
GLOVE BIOGEL PI INDICATOR 7.5 (GLOVE) ×4
GLOVE SURG SIGNA 7.5 PF LTX (GLOVE) ×12 IMPLANT
GOWN STRL REUS W/ TWL LRG LVL3 (GOWN DISPOSABLE) ×16 IMPLANT
GOWN STRL REUS W/ TWL XL LVL3 (GOWN DISPOSABLE) ×4 IMPLANT
GOWN STRL REUS W/TWL LRG LVL3 (GOWN DISPOSABLE) ×16
GOWN STRL REUS W/TWL XL LVL3 (GOWN DISPOSABLE) ×4
HEMOSTAT POWDER SURGIFOAM 1G (HEMOSTASIS) ×12 IMPLANT
HEMOSTAT SURGICEL 2X14 (HEMOSTASIS) ×4 IMPLANT
INSERT FOGARTY XLG (MISCELLANEOUS) IMPLANT
KIT BASIN OR (CUSTOM PROCEDURE TRAY) ×4 IMPLANT
KIT ROOM TURNOVER OR (KITS) ×4 IMPLANT
KIT SUCTION CATH 14FR (SUCTIONS) ×8 IMPLANT
KIT VASOVIEW W/TROCAR VH 2000 (KITS) ×4 IMPLANT
MARKER GRAFT CORONARY BYPASS (MISCELLANEOUS) ×12 IMPLANT
NS IRRIG 1000ML POUR BTL (IV SOLUTION) ×24 IMPLANT
PACK OPEN HEART (CUSTOM PROCEDURE TRAY) ×4 IMPLANT
PACK TRANSFER 300ML (TOM) (MISCELLANEOUS) ×12 IMPLANT
PAD ARMBOARD 7.5X6 YLW CONV (MISCELLANEOUS) ×16 IMPLANT
PAD ELECT DEFIB RADIOL ZOLL (MISCELLANEOUS) ×4 IMPLANT
PENCIL BUTTON HOLSTER BLD 10FT (ELECTRODE) ×8 IMPLANT
PUNCH AORTIC ROT 4.0MM RCL 40 (MISCELLANEOUS) ×4 IMPLANT
PUNCH AORTIC ROTATE 4.0MM (MISCELLANEOUS) IMPLANT
PUNCH AORTIC ROTATE 4.5MM 8IN (MISCELLANEOUS) IMPLANT
PUNCH AORTIC ROTATE 5MM 8IN (MISCELLANEOUS) IMPLANT
SET CARDIOPLEGIA MPS 5001102 (MISCELLANEOUS) ×4 IMPLANT
SET Y EXTENSION LINE CSP (IV SETS) ×4 IMPLANT
SPONGE LAP 4X18 X RAY DECT (DISPOSABLE) ×4 IMPLANT
STRIP CLOSURE SKIN 1/2X4 (GAUZE/BANDAGES/DRESSINGS) ×3 IMPLANT
SUT BONE WAX W31G (SUTURE) ×4 IMPLANT
SUT MNCRL AB 4-0 PS2 18 (SUTURE) ×4 IMPLANT
SUT PROLENE 3 0 SH DA (SUTURE) ×4 IMPLANT
SUT PROLENE 4 0 RB 1 (SUTURE) ×2
SUT PROLENE 4 0 SH DA (SUTURE) IMPLANT
SUT PROLENE 4-0 RB1 .5 CRCL 36 (SUTURE) ×2 IMPLANT
SUT PROLENE 6 0 C 1 30 (SUTURE) ×16 IMPLANT
SUT PROLENE 7 0 BV1 MDA (SUTURE) ×4 IMPLANT
SUT PROLENE 8 0 BV175 6 (SUTURE) ×8 IMPLANT
SUT SILK  1 MH (SUTURE) ×2
SUT SILK 1 MH (SUTURE) ×2 IMPLANT
SUT STEEL 6MS V (SUTURE) ×4 IMPLANT
SUT STEEL STERNAL CCS#1 18IN (SUTURE) IMPLANT
SUT STEEL SZ 6 DBL 3X14 BALL (SUTURE) ×4 IMPLANT
SUT VIC AB 1 CTX 36 (SUTURE) ×4
SUT VIC AB 1 CTX36XBRD ANBCTR (SUTURE) ×4 IMPLANT
SUT VIC AB 2-0 CT1 27 (SUTURE) ×2
SUT VIC AB 2-0 CT1 TAPERPNT 27 (SUTURE) ×2 IMPLANT
SUT VIC AB 2-0 CTX 27 (SUTURE) IMPLANT
SUT VIC AB 3-0 SH 27 (SUTURE)
SUT VIC AB 3-0 SH 27X BRD (SUTURE) IMPLANT
SUT VIC AB 3-0 X1 27 (SUTURE) IMPLANT
SUT VICRYL 4-0 PS2 18IN ABS (SUTURE) IMPLANT
SUTURE E-PAK OPEN HEART (SUTURE) ×4 IMPLANT
SYSTEM SAHARA CHEST DRAIN ATS (WOUND CARE) ×4 IMPLANT
TAPE CLOTH SURG 4X10 WHT LF (GAUZE/BANDAGES/DRESSINGS) ×4 IMPLANT
TOWEL OR 17X24 6PK STRL BLUE (TOWEL DISPOSABLE) ×8 IMPLANT
TOWEL OR 17X26 10 PK STRL BLUE (TOWEL DISPOSABLE) ×8 IMPLANT
TRAY FOLEY IC TEMP SENS 16FR (CATHETERS) ×4 IMPLANT
TUBE FEEDING 8FR 16IN STR KANG (MISCELLANEOUS) ×4 IMPLANT
TUBING INSUFFLATION (TUBING) ×4 IMPLANT
UNDERPAD 30X30 INCONTINENT (UNDERPADS AND DIAPERS) ×4 IMPLANT
WATER STERILE IRR 1000ML POUR (IV SOLUTION) ×8 IMPLANT

## 2014-08-25 NOTE — Brief Op Note (Addendum)
08/24/2014 - 08/25/2014  11:47 AM  PATIENT:  Philip [name]  54 y.o. male  PRE-OPERATIVE DIAGNOSIS:  SEVERE 2 VESSEL CAD, S/P STEMI  POST-OPERATIVE DIAGNOSIS:  SEVERE 2 VESSEL CAD, S/P STEMI  PROCEDURE:   CORONARY ARTERY BYPASS GRAFTING x 3   LIMA to LAD  SVG to DIAGONAL 1  RIMA to RCA ENDOSCOPIC GREATER SAPHENOUS VEIN HARVEST RIGHT THIGH  SURGEON:  Melrose Nakayama, MD  ASSISTANT: Suzzanne Cloud, PA-C  ANESTHESIA:   general  PATIENT CONDITION:  ICU - intubated and hemodynamically stable.  PRE-OPERATIVE WEIGHT: 75 kg   XC= 53 min CPB= 80 min Good targets and conduits Normal LV function by TEE

## 2014-08-25 NOTE — Transfer of Care (Signed)
Immediate Anesthesia Transfer of Care Note  Patient: Philip [name]  Procedure(s) Performed: Procedure(s): CORONARY ARTERY BYPASS GRAFTING (CABG), ON PUMP, TIMES THREE, USING BILATERAL MAMMARY ARTERIES, RIGHT GREATER SAPHENOUS VEIN HARVESTED ENDOSCOPICALLY (N/A) TRANSESOPHAGEAL ECHOCARDIOGRAM (TEE) (N/A)  Patient Location: ICU  Anesthesia Type:General  Level of Consciousness: Patient remains intubated per anesthesia plan  Airway & Oxygen Therapy: Patient remains intubated per anesthesia plan and Patient placed on Ventilator (see vital sign flow sheet for setting)  Post-op Assessment: Report given to RN  Post vital signs: Reviewed and stable  Last Vitals:  Filed Vitals:   08/25/14 0810  BP:   Pulse:   Temp:   Resp: 16    Complications: No apparent anesthesia complications

## 2014-08-25 NOTE — Anesthesia Procedure Notes (Signed)
Procedure Name: Intubation Date/Time: 08/25/2014 8:50 AM Performed by: Barrington Ellison Pre-anesthesia Checklist: Patient identified, Emergency Drugs available, Suction available, Patient being monitored and Timeout performed Patient Re-evaluated:Patient Re-evaluated prior to inductionOxygen Delivery Method: Circle system utilized Preoxygenation: Pre-oxygenation with 100% oxygen Intubation Type: IV induction Ventilation: Mask ventilation without difficulty Laryngoscope Size: Mac and 3 Grade View: Grade I Tube type: Oral Tube size: 8.0 mm Number of attempts: 1 Airway Equipment and Method: Stylet Placement Confirmation: ETT inserted through vocal cords under direct vision,  positive ETCO2 and breath sounds checked- equal and bilateral Secured at: 23 cm Tube secured with: Tape Dental Injury: Teeth and Oropharynx as per pre-operative assessment

## 2014-08-25 NOTE — Anesthesia Postprocedure Evaluation (Signed)
Anesthesia Post Note  Patient: Philip [name]  Procedure(s) Performed: Procedure(s) (LRB): CORONARY ARTERY BYPASS GRAFTING (CABG), ON PUMP, TIMES THREE, USING BILATERAL MAMMARY ARTERIES, RIGHT GREATER SAPHENOUS VEIN HARVESTED ENDOSCOPICALLY (N/A) TRANSESOPHAGEAL ECHOCARDIOGRAM (TEE) (N/A)  Anesthesia type: General  Patient location: ICU  Post pain: Pain level controlled  Post assessment: Post-op Vital signs reviewed  Last Vitals:  Filed Vitals:   08/25/14 1730  BP:   Pulse: 79  Temp: 36.4 C  Resp: 17    Post vital signs: stable  Level of consciousness: Patient remains intubated per anesthesia plan  Complications: No apparent anesthesia complications

## 2014-08-25 NOTE — OR Nursing (Signed)
12:15 -1st call to SICU charge nurse

## 2014-08-25 NOTE — Procedures (Signed)
Extubation Procedure Note  Patient Details:   Name: Philip [name] DOB: 1960-05-05 MRN: [id number]   Airway Documentation:     Evaluation  O2 sats: stable throughout Complications: No apparent complications Patient did tolerate procedure well.  NIF-30  FVC-750 Placed on 4l/min [place]   Yes  Revonda Standard 08/25/2014, 5:26 PM

## 2014-08-25 NOTE — Progress Notes (Signed)
Patient ID: Philip [name], male   DOB: Jan 27, 1961, 54 y.o.   MRN: [id number]   SICU Evening Rounds:   Hemodynamically stable  CI = 3.3  Extubated, still on precedex  Urine output good  CT output low  CBC    Component Value Date/Time   WBC 9.1 08/25/2014 1845   RBC 3.26* 08/25/2014 1845   HGB 7.5* 08/25/2014 1848   HCT 22.0* 08/25/2014 1848   PLT 172 08/25/2014 1845   MCV 71.5* 08/25/2014 1845   MCH 21.2* 08/25/2014 1845   MCHC 29.6* 08/25/2014 1845   RDW 21.4* 08/25/2014 1845   LYMPHSABS 1.7 07/02/2014 0206   MONOABS 0.6 07/02/2014 0206   EOSABS 0.1 07/02/2014 0206   BASOSABS 0.1 07/02/2014 0206     BMET    Component Value Date/Time   NA 140 08/25/2014 1848   K 4.2 08/25/2014 1848   CL 107 08/25/2014 1848   CO2 21* 08/25/2014 0300   GLUCOSE 117* 08/25/2014 1848   BUN 5* 08/25/2014 1848   CREATININE 0.70 08/25/2014 1848   CALCIUM 8.9 08/25/2014 0300   GFRNONAA >60 08/25/2014 1845   GFRAA >60 08/25/2014 1845     A/P:  Stable postop course. His Hgb tonight is 6.9 from the lab and 7.5 by I-stat. Will transfuse 1 unit of PRBC's. Discussed with patient.

## 2014-08-25 NOTE — Plan of Care (Signed)
Problem: Phase I - Pre-Op Goal: Pre-Op Education completed Outcome: Completed/Met Date Met:  08/25/14 Cardiac surgery education booklet, preparing for heart surgery patient education video, recovering from heart surgery patient education video, incentive spirometer fact sheet and education video

## 2014-08-25 NOTE — Progress Notes (Signed)
Echocardiogram Echocardiogram Transesophageal has been performed.  [name] 08/25/2014, 9:35 AM

## 2014-08-25 NOTE — OR Nursing (Signed)
Second call to SICU charge nurse at 1241.

## 2014-08-25 NOTE — OR Nursing (Signed)
12:10 - Off pump call to SICU charge nurse

## 2014-08-25 NOTE — Progress Notes (Signed)
ANTICOAGULATION CONSULT NOTE - Follow Up Consult  Pharmacy Consult for Heparin  Indication: Multi-vessel disease, CABG in AM  Allergies  Allergen Reactions  . Contrast Media [Iodinated Diagnostic Agents] Other (See Comments)    Pt states that he got "knots behind his ears".  . Nsaids Other (See Comments)    Reaction:  Blood in stool     Patient Measurements: Height: '5\' 10"'$  (177.8 cm) Weight: 171 lb (77.565 kg) IBW/kg (Calculated) : 73  Vital Signs: Temp: 97.9 F (36.6 C) (05/10 1700) Temp Source: Oral (05/10 1700) BP: 160/91 mmHg (05/11 0000) Pulse Rate: 79 (05/11 0000) Labs:  Recent Labs  08/24/14 1220 08/24/14 1905 08/24/14 2248  HGB 11.4*  --   --   HCT 36.8*  --   --   PLT 321  --   --   APTT 25  --   --   LABPROT 12.4 13.3  --   INR 0.90 1.00  --   HEPARINUNFRC  --   --  0.18*  CREATININE 1.26*  --   --   TROPONINI 0.41*  --   --     Estimated Creatinine Clearance: 69.2 mL/min (by C-G formula based on Cr of 1.26).  Assessment: Sub-therapeutic heparin level, s/p cath, first case CABG this AM  Goal of Therapy:  Heparin level 0.3-0.7 units/ml Monitor platelets by anticoagulation protocol: Yes   Plan:  -Increase heparin to 1100 units/hr -First case CABG this AM, heparin will be turned off  Narda Bonds 08/25/2014,12:26 AM

## 2014-08-25 NOTE — Interval H&P Note (Signed)
History and Physical Interval Note:  Anemia worsened overnight, suspect retroperitoneal hematoma Has critical CAD will proceed with CABG  08/25/2014 7:58 AM  Philip [name]  has presented today for surgery, with the diagnosis of CAD  The various methods of treatment have been discussed with the patient and family. After consideration of risks, benefits and other options for treatment, the patient has consented to  Procedure(s): CORONARY ARTERY BYPASS GRAFTING (CABG) (N/A) TRANSESOPHAGEAL ECHOCARDIOGRAM (TEE) (N/A) as a surgical intervention .  The patient's history has been reviewed, patient examined, no change in status, stable for surgery.  I have reviewed the patient's chart and labs.  Questions were answered to the patient's satisfaction.     Melrose Nakayama

## 2014-08-25 NOTE — Anesthesia Preprocedure Evaluation (Addendum)
Anesthesia Evaluation  Patient identified by MRN, date of birth, ID band Patient awake    Reviewed: Allergy & Precautions, NPO status , Patient's Chart, lab work & pertinent test results, reviewed documented beta blocker date and time   Airway Mallampati: I   Neck ROM: Full    Dental  (+) Missing, Poor Dentition, Dental Advisory Given, Partial Upper, Partial Lower   Pulmonary Current Smoker,    Pulmonary exam normal       Cardiovascular + angina at rest + CAD and + Past MI Normal cardiovascular exam    Neuro/Psych Seizures -,  20 years since last seizure    GI/Hepatic   Endo/Other    Renal/GU      Musculoskeletal   Abdominal   Peds  Hematology   Anesthesia Other Findings   Reproductive/Obstetrics                           Anesthesia Physical Anesthesia Plan  ASA: III  Anesthesia Plan: General   Post-op Pain Management:    Induction: Intravenous  Airway Management Planned: Oral ETT  Additional Equipment: Arterial line, CVP, PA Cath, Ultrasound Guidance Line Placement and TEE  Intra-op Plan:   Post-operative Plan: Post-operative intubation/ventilation  Informed Consent: I have reviewed the patients History and Physical, chart, labs and discussed the procedure including the risks, benefits and alternatives for the proposed anesthesia with the patient or authorized representative who has indicated his/her understanding and acceptance.   Dental advisory given  Plan Discussed with: CRNA and Surgeon  Anesthesia Plan Comments:        Anesthesia Quick Evaluation

## 2014-08-26 ENCOUNTER — Encounter (HOSPITAL_COMMUNITY): Payer: Self-pay | Admitting: Thoracic Surgery (Cardiothoracic Vascular Surgery)

## 2014-08-26 ENCOUNTER — Inpatient Hospital Stay (HOSPITAL_COMMUNITY): Payer: No Typology Code available for payment source

## 2014-08-26 LAB — CBC
HEMATOCRIT: 25.2 % — AB (ref 39.0–52.0)
HEMATOCRIT: 28.1 % — AB (ref 39.0–52.0)
HEMOGLOBIN: 8.5 g/dL — AB (ref 13.0–17.0)
Hemoglobin: 7.7 g/dL — ABNORMAL LOW (ref 13.0–17.0)
MCH: 22.6 pg — ABNORMAL LOW (ref 26.0–34.0)
MCH: 22.4 pg — AB (ref 26.0–34.0)
MCHC: 30.6 g/dL (ref 30.0–36.0)
MCHC: 30.2 g/dL (ref 30.0–36.0)
MCV: 74.1 fL — AB (ref 78.0–100.0)
MCV: 73.9 fL — AB (ref 78.0–100.0)
Platelets: 141 10*3/uL — ABNORMAL LOW (ref 150–400)
Platelets: 157 10*3/uL (ref 150–400)
RBC: 3.4 MIL/uL — ABNORMAL LOW (ref 4.22–5.81)
RBC: 3.8 MIL/uL — ABNORMAL LOW (ref 4.22–5.81)
RDW: 20.9 % — ABNORMAL HIGH (ref 11.5–15.5)
RDW: 20.7 % — ABNORMAL HIGH (ref 11.5–15.5)
WBC: 7.2 10*3/uL (ref 4.0–10.5)
WBC: 8.2 10*3/uL (ref 4.0–10.5)

## 2014-08-26 LAB — GLUCOSE, CAPILLARY
GLUCOSE-CAPILLARY: 86 mg/dL (ref 65–99)
GLUCOSE-CAPILLARY: 104 mg/dL — AB (ref 65–99)
Glucose-Capillary: 121 mg/dL — ABNORMAL HIGH (ref 65–99)
Glucose-Capillary: 121 mg/dL — ABNORMAL HIGH (ref 65–99)
Glucose-Capillary: 91 mg/dL (ref 65–99)
Glucose-Capillary: 96 mg/dL (ref 65–99)

## 2014-08-26 LAB — POCT I-STAT, CHEM 8
BUN: 7 mg/dL (ref 6–20)
Calcium, Ion: 1.25 mmol/L — ABNORMAL HIGH (ref 1.12–1.23)
Chloride: 101 mmol/L (ref 101–111)
Creatinine, Ser: 0.6 mg/dL — ABNORMAL LOW (ref 0.61–1.24)
Glucose, Bld: 100 mg/dL — ABNORMAL HIGH (ref 65–99)
HCT: 30 % — ABNORMAL LOW (ref 39.0–52.0)
HEMOGLOBIN: 10.2 g/dL — AB (ref 13.0–17.0)
POTASSIUM: 4.2 mmol/L (ref 3.5–5.1)
Sodium: 135 mmol/L (ref 135–145)
TCO2: 21 mmol/L (ref 0–100)

## 2014-08-26 LAB — BASIC METABOLIC PANEL
ANION GAP: 6 (ref 5–15)
BUN: <5 mg/dL — ABNORMAL LOW (ref 6–20)
CHLORIDE: 109 mmol/L (ref 101–111)
CO2: 23 mmol/L (ref 22–32)
Calcium: 7.9 mg/dL — ABNORMAL LOW (ref 8.9–10.3)
Creatinine, Ser: 0.61 mg/dL (ref 0.61–1.24)
GFR calc Af Amer: >60 mL/min (ref >60)
GFR calc non Af Amer: >60 mL/min (ref >60)
Glucose, Bld: 90 mg/dL (ref 65–99)
Potassium: 4 mmol/L (ref 3.5–5.1)
Sodium: 138 mmol/L (ref 135–145)

## 2014-08-26 LAB — CREATININE, SERUM: Creatinine, Ser: 0.69 mg/dL (ref 0.61–1.24)

## 2014-08-26 LAB — HEMOGLOBIN A1C
HEMOGLOBIN A1C: 5.6 % (ref 4.8–5.6)
Mean Plasma Glucose: 114 mg/dL

## 2014-08-26 LAB — MAGNESIUM
MAGNESIUM: 2.1 mg/dL (ref 1.7–2.4)
Magnesium: 2.3 mg/dL (ref 1.7–2.4)

## 2014-08-26 MED ORDER — FUROSEMIDE 10 MG/ML IJ SOLN
20.0000 mg | Freq: Once | INTRAMUSCULAR | Status: AC
  Administered 2014-08-26: 20 mg via INTRAVENOUS
  Filled 2014-08-26: qty 2

## 2014-08-26 MED ORDER — ALLOPURINOL 100 MG PO TABS
100.0000 mg | ORAL_TABLET | Freq: Two times a day (BID) | ORAL | Status: DC
  Administered 2014-08-26 – 2014-09-01 (×13): 100 mg via ORAL
  Filled 2014-08-26 (×14): qty 1

## 2014-08-26 MED ORDER — POTASSIUM CHLORIDE 10 MEQ/50ML IV SOLN
10.0000 meq | INTRAVENOUS | Status: AC
  Administered 2014-08-26 (×2): 10 meq via INTRAVENOUS
  Filled 2014-08-26 (×2): qty 50

## 2014-08-26 MED ORDER — KETOROLAC TROMETHAMINE 15 MG/ML IJ SOLN: 30.0000 mg | Freq: Four times a day (QID) | INTRAMUSCULAR | Status: DC

## 2014-08-26 MED ORDER — KETOROLAC TROMETHAMINE 15 MG/ML IJ SOLN
30.0000 mg | Freq: Four times a day (QID) | INTRAMUSCULAR | Status: AC
  Administered 2014-08-26 – 2014-08-27 (×5): 30 mg via INTRAVENOUS
  Filled 2014-08-26 (×3): qty 2

## 2014-08-26 MED ORDER — PANTOPRAZOLE SODIUM 40 MG PO TBEC
40.0000 mg | DELAYED_RELEASE_TABLET | Freq: Every day | ORAL | Status: DC
  Administered 2014-08-26 – 2014-08-31 (×6): 40 mg via ORAL
  Filled 2014-08-26 (×7): qty 1

## 2014-08-26 MED ORDER — INSULIN ASPART 100 UNIT/ML ~~LOC~~ SOLN: 0.0000 [IU] | SUBCUTANEOUS | Status: DC

## 2014-08-26 MED ORDER — ENOXAPARIN SODIUM 40 MG/0.4ML ~~LOC~~ SOLN
40.0000 mg | Freq: Every day | SUBCUTANEOUS | Status: DC
  Administered 2014-08-26 – 2014-08-31 (×6): 40 mg via SUBCUTANEOUS
  Filled 2014-08-26 (×7): qty 0.4

## 2014-08-26 MED ORDER — GUAIFENESIN ER 600 MG PO TB12
1200.0000 mg | ORAL_TABLET | Freq: Two times a day (BID) | ORAL | Status: DC
  Administered 2014-08-26 – 2014-09-01 (×13): 1200 mg via ORAL
  Filled 2014-08-26 (×15): qty 2

## 2014-08-26 MED FILL — Lidocaine HCl IV Inj 20 MG/ML: INTRAVENOUS | Qty: 5 | Status: AC

## 2014-08-26 MED FILL — Sodium Bicarbonate IV Soln 8.4%: INTRAVENOUS | Qty: 50 | Status: AC

## 2014-08-26 MED FILL — Mannitol IV Soln 20%: INTRAVENOUS | Qty: 500 | Status: AC

## 2014-08-26 MED FILL — Potassium Chloride Inj 2 mEq/ML: INTRAVENOUS | Qty: 40 | Status: AC

## 2014-08-26 MED FILL — Heparin Sodium (Porcine) Inj 1000 Unit/ML: INTRAMUSCULAR | Qty: 30 | Status: AC

## 2014-08-26 MED FILL — Sodium Chloride IV Soln 0.9%: INTRAVENOUS | Qty: 2000 | Status: AC

## 2014-08-26 MED FILL — Magnesium Sulfate Inj 50%: INTRAMUSCULAR | Qty: 10 | Status: AC

## 2014-08-26 MED FILL — Electrolyte-R (PH 7.4) Solution: INTRAVENOUS | Qty: 3000 | Status: AC

## 2014-08-26 MED FILL — Heparin Sodium (Porcine) Inj 1000 Unit/ML: INTRAMUSCULAR | Qty: 20 | Status: AC

## 2014-08-26 MED FILL — Albumin, Human Inj 5%: INTRAVENOUS | Qty: 250 | Status: AC

## 2014-08-26 NOTE — Progress Notes (Signed)
Utilization review completed. Donovon Micheletti, RN, BSN. 

## 2014-08-26 NOTE — Progress Notes (Signed)
CT surgery p.m. Rounds  Patient examined and record reviewed.Hemodynamics stable,labs satisfactory.Patient had stable day.Continue current care. Philip [name] 08/26/2014

## 2014-08-26 NOTE — Op Note (Signed)
NAMEADARRIUS, GRAEFF            ACCOUNT NO.:  [id number]  MEDICAL RECORD NO.:  [id number]  LOCATION:  2S11C                        FACILITY:  [facility]  PHYSICIAN:  Revonda Standard. Roxan Hockey, M.D.DATE OF BIRTH:  April 19, 1960  DATE OF PROCEDURE:  08/25/2014 DATE OF DISCHARGE:                              OPERATIVE REPORT   PREOPERATIVE DIAGNOSIS:  Severe two-vessel coronary artery disease, status post ST-elevation myocardial infarction.  POSTOPERATIVE DIAGNOSIS:  Severe two-vessel coronary artery disease, status post ST-elevation myocardial infarction.  PROCEDURES:   Median sternotomy, extracorporeal circulation Coronary artery bypass grafting x 3  Left internal mammary artery to left anterior descending,  Saphenous vein graft to first diagonal,  Right internal mammary artery to right coronary artery Endoscopic vein harvest right thigh.  SURGEON:  Revonda Standard. Roxan Hockey, M.D.  ASSISTANT:  Suzzanne Cloud, P.A.  ANESTHESIA:  General.  FINDINGS:  Transesophageal echocardiography revealed preserved left ventricular function with no significant valvular pathology.  Good- quality targets.  Good-quality conduits.  CLINICAL NOTE:  Mr. Lecomte is a 54 year old gentleman with no prior history of coronary artery disease.  He presented on Aug 24, 2014, at [facility] with acute onset of substernal chest pain radiating to his left arm.  He was taken to the Catheterization Laboratory as a STEMI.  He had severe disease in the LAD, compromising both the LAD and the diagonal branch and moderate disease in his right coronary.  His chest pain resolved.  He was transferred to [facility] for definitive management.  The indications, risks, benefits, and alternatives were discussed in detail with the patient.  He understood and accepted the risks of bypass grafting and agreed to proceed.  OPERATIVE NOTE:  Mr. Cornette was brought to the preoperative holding area on Aug 25, 2014. Anesthesia  placed a Swan-Ganz catheter and an arterial blood pressure monitoring line.  He was taken to the operating room, anesthetized and intubated.  Intravenous antibiotics were administered.  Transesophageal echocardiography was performed.  It revealed preserved left ventricular function with no significant valvular pathology.  A Foley catheter was placed.  The chest, abdomen, and legs were prepped and draped in usual sterile fashion.  A median sternotomy was performed.  The left internal mammary artery was harvested using standard technique.  Simultaneously, an incision was made in the medial aspect of the right leg at the level of the knee.  The greater saphenous vein was harvested from the right thigh endoscopically.  2000 units of heparin was administered during the vessel harvest.  After harvesting the left mammary artery, the retractor was placed under the right hemi-sternum, and the right internal mammary artery was harvested using standard technique.  Both mammary arteries were good quality as was the vein.  After harvesting the conduits, the remainder of the full heparin dose was given.  The pericardium was opened.  The ascending aorta was inspected. There was no evidence of atherosclerotic disease.  The aorta was cannulated via concentric 2-0 Ethibond pledgeted pursestring sutures.  A dual-stage venous cannula was placed via a pursestring suture in the right atrial appendage.  After confirming adequate anticoagulation with ACT measurement, cardiopulmonary bypass was begun.  Flows were maintained per protocol.  The patient was cooled to  34 degrees Celsius.  The coronary arteries were inspected and anastomotic sites were chosen. The conduits were inspected and prepared.  A foam pad was placed in the pericardium to insulate the heart.  A temperature probe was placed in the myocardial septum and a cardioplegia cannula was placed in the ascending aorta.  The aorta was crossclamped.   The left ventricle was emptied via the aortic root vent.  Cardiac arrest was achieved with combination of cold antegrade blood cardioplegia and topical iced saline.  One liter of cardioplegia was administered.  There was a rapid diastolic arrest. Septal cooling was more sluggish, but ultimately did cool to 13 degrees Celsius.  A reversed saphenous vein graft was placed end-to-side of the first diagonal branch to the LAD. It was a relatively large anterior branch that bifurcated.  It was grafted just above the bifurcation and a probe did pass into both limbs of the bifurcation.  It was a 1.5-mm good- quality target.  The vein was of good quality. An end-to-side anastomosis was performed with a running 7-0 Prolene suture.  At the completion of anastomosis, cardioplegia was administered through the graft.  There was good flow and good hemostasis.  An additional 500 mL of cardioplegia was administered antegrade at this point as well.  The right internal mammary artery was beveled.  It was anastomosed end-to-side to the distal right coronary near the acute margin of the heart.  The right mammary was a 1.5-mm good-quality conduit with excellent flow.  The right coronary was a 2-mm good-quality conduit.  An end-to-side anastomosis was performed with a running 8-0 Prolene suture.  At completion of the anastomosis, the bulldog clamp was removed.  There was good hemostasis.  The bulldog clamp was replaced and the mammary pedicle was tacked to the epicardial surface of the heart with 6-0 Prolene sutures.  Next, the left internal mammary artery was brought through a window in the pericardium.  The distal end was beveled.  It was anastomosed end-to-side to the distal LAD.  The LAD was a 1.5-mm good-quality target at the site of the anastomosis.  The mammary was a 2-mm good-quality conduit.  An end-to-side anastomosis was performed with a running 8-0 Prolene suture.  At the completion of  anastomosis, the bulldog clamp was briefly removed.  Rapid septal rewarming was noted.  The bulldog clamp was replaced, and the mammary pedicle was tacked to the epicardial surface of the heart with 6-0 Prolene sutures.  Additional cardioplegia was administered.  The vein graft was cut to length.  The cardioplegia cannula was removed from the ascending aorta. The proximal vein graft anastomosis was performed to a 4-mm punch aortotomy with running 6-0 Prolene suture.  At the completion of the proximal anastomosis, the patient was placed in Trendelenburg position and lidocaine was administered.  The bulldog clamps were removed from both mammary arteries.  Rapid septal rewarming was again noted.  The aortic root was de-aired and the aortic crossclamp was removed.  The total crossclamp time was 53 minutes.  The patient required a single defibrillation with 10 joules and then was in sinus rhythm thereafter.  While rewarming was completed, all proximal and distal anastomoses were inspected for hemostasis.  Epicardial pacing wires were placed on the right ventricle and right atrium.  When the core temperature had rewarmed to 37 degrees Celsius, he was weaned from cardiopulmonary bypass on the first attempt without difficulty.  The total bypass time was 80 minutes.  The initial cardiac index was greater  than 2 liters/minute/meter squared.  A test dose of protamine was administered.  There was transient systemic hypotension, which responded to volume and Neo-Synephrine.  The atrial and aortic cannulae were removed.  The remainder of the protamine was administered slowly.  There was again some hypotension, which responded rapidly to resuscitation.  He was then hemodynamically stable after that point.  The chest was copiously irrigated with warm saline.  Hemostasis was achieved.  The pericardium was reapproximated over the ascending aorta and base of the heart with interrupted 3-0 silk  sutures. Bilateral pleural tubes and a single mediastinal tube were placed through separate subcostal incisions.  The sternum was closed with a combination of single and double heavy-gauge stainless steel wires.  The pectoralis fascia, subcutaneous tissue, and skin were closed in standard fashion.  All sponge, needle, and instrument counts were correct at the end of the procedure.  The patient was taken from the operating room to the surgical intensive care unit in good condition.     Revonda Standard Roxan Hockey, M.D.     SCH/MEDQ  D:  08/25/2014  T:  08/26/2014  Job:  [id number]

## 2014-08-26 NOTE — Progress Notes (Signed)
1 Day Post-Op Procedure(s) (LRB): CORONARY ARTERY BYPASS GRAFTING (CABG), ON PUMP, TIMES THREE, USING BILATERAL MAMMARY ARTERIES, RIGHT GREATER SAPHENOUS VEIN HARVESTED ENDOSCOPICALLY (N/A) TRANSESOPHAGEAL ECHOCARDIOGRAM (TEE) (N/A) Subjective: C/o incisional pain and cough, denies nausea  Objective: Vital signs in last 24 hours: Temp:  [97.2 F (36.2 C)-98.8 F (37.1 C)] 98.8 F (37.1 C) (05/12 0800) Pulse Rate:  [62-99] 92 (05/12 0800) Cardiac Rhythm:  [-] Normal sinus rhythm (05/12 0600) Resp:  [9-29] 16 (05/12 0800) BP: (87-124)/(56-70) 124/63 mmHg (05/12 0800) SpO2:  [94 %-100 %] 96 % (05/12 0800) Arterial Line BP: (90-153)/(45-117) 125/80 mmHg (05/12 0800) FiO2 (%):  [40 %-50 %] 40 % (05/11 1535) Weight:  [178 lb 9.6 oz (81.012 kg)] 178 lb 9.6 oz (81.012 kg) (05/12 0500)  Hemodynamic parameters for last 24 hours: PAP: (21-99)/(6-54) 36/20 mmHg CO:  [4.7 L/min-7.2 L/min] 7.2 L/min CI:  [2.5 L/min/m2-3.7 L/min/m2] 3.7 L/min/m2  Intake/Output from previous day: 05/11 0701 - 05/12 0700 In: 5590.6 [P.O.:270; I.V.:3255.6; Blood:585; NG/GT:30; IV Piggyback:1450] Out: 2925 [Urine:1575; Emesis/NG output:50; Blood:900; Chest Tube:400] Intake/Output this shift: Total I/O In: -  Out: 35 [Urine:35]  General appearance: alert and mild distress Neurologic: intact Heart: regular rate and rhythm Lungs: diminished breath sounds bibasilar and rhonchi bilaterally Abdomen: normal findings: soft, non-tender  Lab Results:  Recent Labs  08/25/14 1845 08/25/14 1848 08/26/14 0340  WBC 9.1  --  7.2  HGB 6.9* 7.5* 7.7*  HCT 23.3* 22.0* 25.2*  PLT 172  --  141*   BMET:  Recent Labs  08/25/14 0300  08/25/14 1848 08/26/14 0340  NA 139  < > 140 138  K 3.9  < > 4.2 4.0  CL 107  < > 107 109  CO2 21*  --   --  23  GLUCOSE 121*  < > 117* 90  BUN 6  < > 5* <5*  CREATININE 0.68  < > 0.70 0.61  CALCIUM 8.9  --   --  7.9*  < > = values in this interval not displayed.  PT/INR:   Recent Labs  08/25/14 1320  LABPROT 16.0*  INR 1.27   ABG    Component Value Date/Time   PHART 7.292* 08/25/2014 1655   HCO3 21.2 08/25/2014 1655   TCO2 20 08/25/2014 1848   ACIDBASEDEF 5.0* 08/25/2014 1655   O2SAT 92.0 08/25/2014 1655   CBG (last 3)   Recent Labs  08/25/14 2109 08/25/14 2316 08/26/14 0339  GLUCAP 122* 99 86    Assessment/Plan: S/P Procedure(s) (LRB): CORONARY ARTERY BYPASS GRAFTING (CABG), ON PUMP, TIMES THREE, USING BILATERAL MAMMARY ARTERIES, RIGHT GREATER SAPHENOUS VEIN HARVESTED ENDOSCOPICALLY (N/A) TRANSESOPHAGEAL ECHOCARDIOGRAM (TEE) (N/A) POD # 1  CV- stable hemodynamics- dc swan  ECG shows T wave inversion anterolaterally  ASA, beta blocker, statin, consider adding ACE-I prior to dc  RESP- nebs, mucinex, IS  RENAL- creatinine and lytes OK  Volume overloaded- diurese  ENDO_ CBG OK, SSI Q4 PRN  Anemia- secondary to ABL preop and intraop- transfused one unit  Follow  SCD + enoxaparin for DVT prophylaxis  OOB and ambulate   LOS: 2 days    Melrose Nakayama 08/26/2014

## 2014-08-26 NOTE — Progress Notes (Signed)
Utilization review completed. Thelton Graca, RN, BSN. 

## 2014-08-26 NOTE — Progress Notes (Addendum)
Initial Nutrition Assessment  DOCUMENTATION CODES:  Not applicable  INTERVENTION:   (No nutrition intervention at this time -- pt declined)  NUTRITION DIAGNOSIS:  Increased nutrient needs related to  (post-op healing) as evidenced by estimated needs  GOAL:  Patient will meet greater than or equal to 90% of their needs  MONITOR:  PO intake, Labs, Weight trends, I & O's  REASON FOR ASSESSMENT:  Malnutrition Screening Tool  ASSESSMENT: 54 yo Male with a strong family history of CAD and ongoing tobacco abuse presents withacute onset of chest pain.  Patient s/p procedures 5/11: MEDIAN STERNOTOMY EXTRACORPOREAL CIRCULATION CORONARY ARTERY BYPASS GRAFTING x 3 ENDOSCOPIC VEIN HARVEST RIGHT THIGH  Pt reports a decreased appetite.  States he only consumes 1 meal per day with snacks.  Weight has been stable.  Currently on Clear Liquids.  RD offered oral nutrition supplements, however, pt declining at this time.  Nutrition focused physical exam completed.  No muscle or subcutaneous fat depletion noticed.  Height:  Ht Readings from Last 1 Encounters:  08/24/14 '5\' 10"'$  (1.778 m)    Weight:  Wt Readings from Last 1 Encounters:  08/26/14 178 lb 9.6 oz (81.012 kg)    Ideal Body Weight:  75 kg  Wt Readings from Last 20 Encounters:  08/26/14 178 lb 9.6 oz (81.012 kg)  08/24/14 171 lb (77.565 kg)  08/19/14 171 lb 1.6 oz (77.61 kg)  08/04/14 172 lb 4.8 oz (78.155 kg)  07/30/14 178 lb (80.74 kg)  07/20/14 178 lb (80.74 kg)  07/13/14 178 lb 1.6 oz (80.786 kg)  07/06/14 178 lb 8 oz (80.967 kg)  07/01/14 176 lb (79.833 kg)    BMI:  Body mass index is 25.63 kg/(m^2).  Estimated Nutritional Needs:  Kcal:  2200-2400  Protein:  120-130 gm  Fluid:  2.2-2.4 L  Skin:  Reviewed, no issues  Diet Order:  Diet clear liquid Room service appropriate?: Yes; Fluid consistency:: Thin Diet clear liquid Room service appropriate?: Yes; Fluid consistency:: Thin  EDUCATION  NEEDS:  No education needs identified at this time   Intake/Output Summary (Last 24 hours) at 08/26/14 1442 Last data filed at 08/26/14 1200  Gross per 24 hour  Intake 2158.66 ml  Output   2435 ml  Net -276.34 ml    Last BM:  unknown  Arthur Holms, RD, LDN Pager #: [phone] After-Hours Pager #: [phone]

## 2014-08-27 ENCOUNTER — Inpatient Hospital Stay (HOSPITAL_COMMUNITY): Payer: No Typology Code available for payment source

## 2014-08-27 ENCOUNTER — Encounter: Payer: Self-pay | Admitting: Cardiology

## 2014-08-27 LAB — GLUCOSE, CAPILLARY
GLUCOSE-CAPILLARY: 103 mg/dL — AB (ref 65–99)
GLUCOSE-CAPILLARY: 106 mg/dL — AB (ref 65–99)
GLUCOSE-CAPILLARY: 125 mg/dL — AB (ref 65–99)
Glucose-Capillary: 104 mg/dL — ABNORMAL HIGH (ref 65–99)
Glucose-Capillary: 137 mg/dL — ABNORMAL HIGH (ref 65–99)

## 2014-08-27 LAB — CBC
HEMATOCRIT: 27 % — AB (ref 39.0–52.0)
Hemoglobin: 8.2 g/dL — ABNORMAL LOW (ref 13.0–17.0)
MCH: 22.7 pg — AB (ref 26.0–34.0)
MCHC: 30.4 g/dL (ref 30.0–36.0)
MCV: 74.6 fL — AB (ref 78.0–100.0)
PLATELETS: 148 10*3/uL — AB (ref 150–400)
RBC: 3.62 MIL/uL — ABNORMAL LOW (ref 4.22–5.81)
RDW: 21.1 % — ABNORMAL HIGH (ref 11.5–15.5)
WBC: 6.7 10*3/uL (ref 4.0–10.5)

## 2014-08-27 LAB — BASIC METABOLIC PANEL
ANION GAP: 5 (ref 5–15)
BUN: 8 mg/dL (ref 6–20)
CO2: 29 mmol/L (ref 22–32)
CREATININE: 0.7 mg/dL (ref 0.61–1.24)
Calcium: 8.4 mg/dL — ABNORMAL LOW (ref 8.9–10.3)
Chloride: 101 mmol/L (ref 101–111)
GFR calc Af Amer: >60 mL/min (ref >60)
Glucose, Bld: 108 mg/dL — ABNORMAL HIGH (ref 65–99)
Potassium: 5.2 mmol/L — ABNORMAL HIGH (ref 3.5–5.1)
SODIUM: 135 mmol/L (ref 135–145)

## 2014-08-27 MED ORDER — METOPROLOL TARTRATE 25 MG/10 ML ORAL SUSPENSION
25.0000 mg | Freq: Two times a day (BID) | ORAL | Status: DC
  Filled 2014-08-27 (×12): qty 10

## 2014-08-27 MED ORDER — SODIUM CHLORIDE 0.9 % IV SOLN: 250.0000 mL | INTRAVENOUS | Status: DC | PRN

## 2014-08-27 MED ORDER — SODIUM CHLORIDE 0.9 % IJ SOLN: 3.0000 mL | INTRAMUSCULAR | Status: DC | PRN

## 2014-08-27 MED ORDER — METOPROLOL TARTRATE 25 MG PO TABS
25.0000 mg | ORAL_TABLET | Freq: Two times a day (BID) | ORAL | Status: DC
  Administered 2014-08-27 – 2014-09-01 (×11): 25 mg via ORAL
  Filled 2014-08-27 (×12): qty 1

## 2014-08-27 MED ORDER — INSULIN ASPART 100 UNIT/ML ~~LOC~~ SOLN
0.0000 [IU] | Freq: Three times a day (TID) | SUBCUTANEOUS | Status: DC
  Administered 2014-08-28 (×2): 1 [IU] via SUBCUTANEOUS

## 2014-08-27 MED ORDER — MAGNESIUM HYDROXIDE 400 MG/5ML PO SUSP: 30.0000 mL | Freq: Every day | ORAL | Status: DC | PRN

## 2014-08-27 MED ORDER — FUROSEMIDE 40 MG PO TABS
40.0000 mg | ORAL_TABLET | Freq: Every day | ORAL | Status: DC
  Administered 2014-08-27 – 2014-09-01 (×6): 40 mg via ORAL
  Filled 2014-08-27 (×6): qty 1

## 2014-08-27 MED ORDER — MOVING RIGHT ALONG BOOK
Freq: Once | Status: AC
  Administered 2014-08-27: 14:00:00
  Filled 2014-08-27 (×2): qty 1

## 2014-08-27 MED ORDER — ATORVASTATIN CALCIUM 40 MG PO TABS
40.0000 mg | ORAL_TABLET | Freq: Every day | ORAL | Status: DC
Start: 2014-08-27 — End: 2014-09-01
  Administered 2014-08-27 – 2014-08-31 (×5): 40 mg via ORAL
  Filled 2014-08-27 (×6): qty 1

## 2014-08-27 MED ORDER — ZOLPIDEM TARTRATE 5 MG PO TABS: 5.0000 mg | ORAL_TABLET | Freq: Every evening | ORAL | Status: DC | PRN

## 2014-08-27 MED ORDER — SODIUM CHLORIDE 0.9 % IJ SOLN
3.0000 mL | Freq: Two times a day (BID) | INTRAMUSCULAR | Status: DC
  Administered 2014-08-27 – 2014-08-31 (×10): 3 mL via INTRAVENOUS

## 2014-08-27 MED ORDER — ALUM & MAG HYDROXIDE-SIMETH 200-200-20 MG/5ML PO SUSP: 15.0000 mL | ORAL | Status: DC | PRN

## 2014-08-27 MED ORDER — IPRATROPIUM-ALBUTEROL 0.5-2.5 (3) MG/3ML IN SOLN
3.0000 mL | Freq: Four times a day (QID) | RESPIRATORY_TRACT | Status: DC
  Administered 2014-08-27 – 2014-08-29 (×11): 3 mL via RESPIRATORY_TRACT
  Filled 2014-08-27 (×12): qty 3

## 2014-08-27 MED ORDER — ALPRAZOLAM 0.25 MG PO TABS
0.2500 mg | ORAL_TABLET | Freq: Four times a day (QID) | ORAL | Status: DC | PRN
  Administered 2014-08-27 – 2014-08-30 (×4): 0.25 mg via ORAL
  Filled 2014-08-27 (×4): qty 1

## 2014-08-27 NOTE — Progress Notes (Signed)
2 Days Post-Op Procedure(s) (LRB): CORONARY ARTERY BYPASS GRAFTING (CABG), ON PUMP, TIMES THREE, USING BILATERAL MAMMARY ARTERIES, RIGHT GREATER SAPHENOUS VEIN HARVESTED ENDOSCOPICALLY (N/A) TRANSESOPHAGEAL ECHOCARDIOGRAM (TEE) (N/A) Subjective: Some incisional pain  Objective: Vital signs in last 24 hours: Temp:  [97.2 F (36.2 C)-99 F (37.2 C)] 99 F (37.2 C) (05/13 0357) Pulse Rate:  [65-92] 82 (05/13 0700) Cardiac Rhythm:  [-] Normal sinus rhythm (05/13 0600) Resp:  [12-22] 17 (05/13 0700) BP: (104-140)/(43-79) 104/59 mmHg (05/13 0700) SpO2:  [94 %-98 %] 94 % (05/13 0700) Arterial Line BP: (125-152)/(80-114) 152/94 mmHg (05/12 1000) Weight:  [175 lb 11.3 oz (79.7 kg)] 175 lb 11.3 oz (79.7 kg) (05/13 0500)  Hemodynamic parameters for last 24 hours: PAP: (36-53)/(20-35) 53/35 mmHg  Intake/Output from previous day: 05/12 0701 - 05/13 0700 In: 760 [P.O.:240; I.V.:320; IV Piggyback:200] Out: 1920 [TZGYF:7494] Intake/Output this shift:    General appearance: alert, cooperative and no distress Neurologic: intact Heart: regular rate and rhythm and occasional early beat Lungs: rhonchi bilaterally Abdomen: normal findings: soft, non-tender  Lab Results:  Recent Labs  08/26/14 1648 08/27/14 0444  WBC 8.2 6.7  HGB 8.5* 8.2*  HCT 28.1* 27.0*  PLT 157 148*   BMET:  Recent Labs  08/26/14 0340 08/26/14 1630 08/26/14 1648 08/27/14 0444  NA 138 135  --  135  K 4.0 4.2  --  5.2*  CL 109 101  --  101  CO2 23  --   --  29  GLUCOSE 90 100*  --  108*  BUN <5* 7  --  8  CREATININE 0.61 0.60* 0.69 0.70  CALCIUM 7.9*  --   --  8.4*    PT/INR:  Recent Labs  08/25/14 1320  LABPROT 16.0*  INR 1.27   ABG    Component Value Date/Time   PHART 7.292* 08/25/2014 1655   HCO3 21.2 08/25/2014 1655   TCO2 21 08/26/2014 1630   ACIDBASEDEF 5.0* 08/25/2014 1655   O2SAT 92.0 08/25/2014 1655   CBG (last 3)   Recent Labs  08/26/14 1921 08/26/14 2337 08/27/14 0344   GLUCAP 121* 96 103*    Assessment/Plan: S/P Procedure(s) (LRB): CORONARY ARTERY BYPASS GRAFTING (CABG), ON PUMP, TIMES THREE, USING BILATERAL MAMMARY ARTERIES, RIGHT GREATER SAPHENOUS VEIN HARVESTED ENDOSCOPICALLY (N/A) TRANSESOPHAGEAL ECHOCARDIOGRAM (TEE) (N/A) Plan for transfer to step-down: see transfer orders  POD # 2  CV- in SR with some PACs- increase metoprolol to 25 BID  ASA, statin, beta blocker  Add ACE-I prior to dc if BP allows  RESP_ some atelectasis, rhonchi, faint wheezing  Continue IS, flutter, add combivent MDI  RENAL_ lytes and creatinine Ok- start PO lasix  Dc Foley  DVT prophylaxis- SCD + enoxaparin  Anemia secondary to ABL- Hgb stable  Mobilize   LOS: 3 days    Philip [name] 08/27/2014

## 2014-08-27 NOTE — Progress Notes (Addendum)
CARDIAC REHAB PHASE I   PRE:  Rate/Rhythm: 85 Sr  BP:  Sitting: 136/75        SaO2: 100 4L  MODE:  Ambulation: 380 ft   POST:  Rate/Rhythm: 87 SR  BP:  Sitting: 129/67         SaO2: 99 4L  Pt states he doesn't feel well today. Agreeable to walk. Pt ambulated 380 ft on 4L O2, slow steady gait, tolerated well, limited mostly by R foot pain related to gout. Denies CP, dizziness, SOB.  Pt has a lot of congestion, encouraged pt to be up in chair, ambulation, IS and flutter valve. Pt verbalized understanding, needs encouragement. Pt to recliner after walk, feet elevated, call bell within reach, wife at bedside.   [phone]  Lenna Sciara, RN, BSN 08/27/2014 3:23 PM

## 2014-08-28 ENCOUNTER — Encounter (HOSPITAL_COMMUNITY): Payer: Self-pay

## 2014-08-28 ENCOUNTER — Inpatient Hospital Stay (HOSPITAL_COMMUNITY): Payer: No Typology Code available for payment source

## 2014-08-28 LAB — CBC
HEMATOCRIT: 27.6 % — AB (ref 39.0–52.0)
Hemoglobin: 8.3 g/dL — ABNORMAL LOW (ref 13.0–17.0)
MCH: 22.3 pg — AB (ref 26.0–34.0)
MCHC: 30.1 g/dL (ref 30.0–36.0)
MCV: 74.2 fL — ABNORMAL LOW (ref 78.0–100.0)
Platelets: 188 10*3/uL (ref 150–400)
RBC: 3.72 MIL/uL — ABNORMAL LOW (ref 4.22–5.81)
RDW: 21.6 % — ABNORMAL HIGH (ref 11.5–15.5)
WBC: 7.6 10*3/uL (ref 4.0–10.5)

## 2014-08-28 LAB — GLUCOSE, CAPILLARY
GLUCOSE-CAPILLARY: 115 mg/dL — AB (ref 65–99)
Glucose-Capillary: 103 mg/dL — ABNORMAL HIGH (ref 65–99)
Glucose-Capillary: 122 mg/dL — ABNORMAL HIGH (ref 65–99)
Glucose-Capillary: 148 mg/dL — ABNORMAL HIGH (ref 65–99)

## 2014-08-28 LAB — TYPE AND SCREEN
ABO/RH(D): A POS
Antibody Screen: NEGATIVE
UNIT DIVISION: 0
Unit division: 0

## 2014-08-28 LAB — BASIC METABOLIC PANEL
ANION GAP: 7 (ref 5–15)
BUN: 10 mg/dL (ref 6–20)
CO2: 27 mmol/L (ref 22–32)
CREATININE: 0.72 mg/dL (ref 0.61–1.24)
Calcium: 8.3 mg/dL — ABNORMAL LOW (ref 8.9–10.3)
Chloride: 100 mmol/L — ABNORMAL LOW (ref 101–111)
Glucose, Bld: 106 mg/dL — ABNORMAL HIGH (ref 65–99)
Potassium: 3.6 mmol/L (ref 3.5–5.1)
SODIUM: 134 mmol/L — AB (ref 135–145)

## 2014-08-28 MED ORDER — LISINOPRIL 2.5 MG PO TABS
2.5000 mg | ORAL_TABLET | Freq: Every day | ORAL | Status: DC
  Administered 2014-08-28 – 2014-08-30 (×3): 2.5 mg via ORAL
  Filled 2014-08-28 (×4): qty 1

## 2014-08-28 NOTE — Progress Notes (Signed)
CARDIAC REHAB PHASE I   PRE:  Rate/Rhythm: 91 SR  BP:  Sitting: 128/76      SaO2: 94 on 4L at rest, 84 on RA at rest  MODE:  Ambulation: 350 ft   POST:  Rate/Rhythm: 97 SR  BP:  Sitting: 132/64     SaO2: 93 4L  1015-1050 Patient ambulated x 1 assist with RW. Slight limp noted related to pain in R foot from gout. No rest breaks taken. Medication ordered. Prior to walk patient o2 sats 94 on 4L. Patient immediately desaturated to 84% when oxygen removed. Ambulatory saturations 88-91% on 4L. Post walk patient back to recliner with call bell in reach. Worked with patient on using flutter valve and cough/deep breath. Patient coughing up thick white secretions. Also encouraged patient to use heart pillow for coughing to decrease pain in sternum. Patient reluctant to cough because of pain. Patient also encouraged to walk 2 more times today if possible.  Santina Evans, BSN 08/28/2014 10:50 AM

## 2014-08-28 NOTE — Progress Notes (Addendum)
       [address]       [place] [address]             [phone]          3 Days Post-Op Procedure(s) (LRB): CORONARY ARTERY BYPASS GRAFTING (CABG), ON PUMP, TIMES THREE, USING BILATERAL MAMMARY ARTERIES, RIGHT GREATER SAPHENOUS VEIN HARVESTED ENDOSCOPICALLY (N/A) TRANSESOPHAGEAL ECHOCARDIOGRAM (TEE) (N/A)  Subjective: Having a lot of pain in R foot from gout. Otherwise doing well.   Objective: Vital signs in last 24 hours: Patient Vitals for the past 24 hrs:  BP Temp Temp src Pulse Resp SpO2 Weight  08/28/14 0901 - - - - - (!) 88 % -  08/28/14 0640 (!) 147/76 mmHg 99.3 F (37.4 C) Oral 94 20 99 % 173 lb 12.8 oz (78.835 kg)  08/27/14 2110 132/69 mmHg 98.3 F (36.8 C) Oral 93 20 92 % -  08/27/14 2003 - - - - - 98 % -  08/27/14 1655 - - - - - 96 % -  08/27/14 1636 - - - - - 95 % -  08/27/14 1359 115/62 mmHg 97.2 F (36.2 C) Oral 85 17 94 % -  08/27/14 1200 - - - - 16 92 % -  08/27/14 1150 126/70 mmHg 97.4 F (36.3 C) Oral 82 16 (!) 85 % -  08/27/14 1100 (!) 119/52 mmHg - - - (!) 21 - -  08/27/14 1000 121/65 mmHg - - - 14 - -   Current Weight  08/28/14 173 lb 12.8 oz (78.835 kg)  PRE-OPERATIVE WEIGHT: 75 kg   Intake/Output from previous day: 05/13 0701 - 05/14 0700 In: 363 [P.O.:360; I.V.:3] Out: 625 [Urine:625]  CBGs 125-106-103   PHYSICAL EXAM:  Heart: RRR Lungs: Coarse bilateral BS Wound: Clean and dry Extremities: Mild LE edema    Lab Results: CBC: Recent Labs  08/27/14 0444 08/28/14 0319  WBC 6.7 7.6  HGB 8.2* 8.3*  HCT 27.0* 27.6*  PLT 148* 188   BMET:  Recent Labs  08/27/14 0444 08/28/14 0319  NA 135 134*  K 5.2* 3.6  CL 101 100*  CO2 29 27  GLUCOSE 108* 106*  BUN 8 10  CREATININE 0.70 0.72  CALCIUM 8.4* 8.3*    PT/INR:  Recent Labs  08/25/14 1320  LABPROT 16.0*  INR 1.27      Assessment/Plan: S/P Procedure(s) (LRB): CORONARY ARTERY BYPASS GRAFTING (CABG), ON PUMP, TIMES THREE, USING BILATERAL  MAMMARY ARTERIES, RIGHT GREATER SAPHENOUS VEIN HARVESTED ENDOSCOPICALLY (N/A) TRANSESOPHAGEAL ECHOCARDIOGRAM (TEE) (N/A)  CV- Maintaining SR, BPs trending up.  Will continue beta blocker, add low dose ACE-I.  Pulm- continue pulm toilet, IS, nebs, Mucinex. Wean O2 as able.  Vol overload- diurese.  Expected postop blood loss anemia- H/H generally stable.  Gout- Back on home allopurinol, colchicine added for short term.  Continue to monitor.  CRPI.  LOS: 4 days    COLLINS,GINA H 08/28/2014   Chart reviewed, patient examined, agree with above.

## 2014-08-29 LAB — GLUCOSE, CAPILLARY
Glucose-Capillary: 104 mg/dL — ABNORMAL HIGH (ref 65–99)
Glucose-Capillary: 127 mg/dL — ABNORMAL HIGH (ref 65–99)

## 2014-08-29 NOTE — Progress Notes (Addendum)
       [address]       [place] [address]             [phone]          4 Days Post-Op Procedure(s) (LRB): CORONARY ARTERY BYPASS GRAFTING (CABG), ON PUMP, TIMES THREE, USING BILATERAL MAMMARY ARTERIES, RIGHT GREATER SAPHENOUS VEIN HARVESTED ENDOSCOPICALLY (N/A) TRANSESOPHAGEAL ECHOCARDIOGRAM (TEE) (N/A)  Subjective: Still c/o gout pain R foot, difficulty walking.  Coughing up some yellow mucus and developed bloody drainage from sternal wound last night.   Objective: Vital signs in last 24 hours: Patient Vitals for the past 24 hrs:  BP Temp Temp src Pulse Resp SpO2 Weight  08/29/14 0432 118/73 mmHg 98.2 F (36.8 C) Oral 92 18 94 % 171 lb 1.2 oz (77.6 kg)  08/28/14 1951 111/62 mmHg 98.3 F (36.8 C) Oral 100 18 94 % -  08/28/14 1630 - - - - - 94 % -  08/28/14 1500 114/66 mmHg 97.7 F (36.5 C) Oral (!) 103 18 93 % -  08/28/14 1246 - - - - - 97 % -  08/28/14 0901 - - - - - (!) 88 % -   Current Weight  08/29/14 171 lb 1.2 oz (77.6 kg)  PRE-OPERATIVE WEIGHT: 75 kg   Intake/Output from previous day: 05/14 0701 - 05/15 0700 In: 240 [P.O.:240] Out: 875 [Urine:875]    PHYSICAL EXAM:  Heart: RRR Lungs: Coarse rhonchi bilaterally Wound: Mild serosanguinous drainage from mid sternum, leg incisions clean and dry Extremities: Mild LE edema    Lab Results: CBC: Recent Labs  08/27/14 0444 08/28/14 0319  WBC 6.7 7.6  HGB 8.2* 8.3*  HCT 27.0* 27.6*  PLT 148* 188   BMET:  Recent Labs  08/27/14 0444 08/28/14 0319  NA 135 134*  K 5.2* 3.6  CL 101 100*  CO2 29 27  GLUCOSE 108* 106*  BUN 8 10  CREATININE 0.70 0.72  CALCIUM 8.4* 8.3*    PT/INR: No results for input(s): LABPROT, INR in the last 72 hours.    Assessment/Plan: S/P Procedure(s) (LRB): CORONARY ARTERY BYPASS GRAFTING (CABG), ON PUMP, TIMES THREE, USING BILATERAL MAMMARY ARTERIES, RIGHT GREATER SAPHENOUS VEIN HARVESTED ENDOSCOPICALLY (N/A) TRANSESOPHAGEAL ECHOCARDIOGRAM  (TEE) (N/A)  CV- SR, BPs stable. Continue beta blocker, ACE-I.  Pulm- Desats with exertion, productive cough.  Continue pulm toilet, IS, nebs, Mucinex. Wean O2 as able.    Vol overload- diurese.  Expected postop blood loss anemia- H/H generally stable.  Gout- Back on home allopurinol, colchicine added for short term. Continue to monitor.  May need to resume prn Mobic since he is not getting relief.  Sternal drainage- serosanguinous, no surrounding erythema.  Monitor closely.   LOS: 5 days    COLLINS,GINA H 08/29/2014   Chart reviewed, patient examined, agree with above. He continues to complain of right ankle pain that he thinks is gout. He has had that for quite a while preop. He was taking Mobic before but has had GI bleeding on it so I don't think he should be taking an NSAID's. Continue allopurinol and colchicine.  He had some drainage from the chest incision after coughing last night but it looks fine now. Sternum feels stable. Encouraged to use pillow to stabilize chest while coughing.

## 2014-08-30 MED ORDER — IPRATROPIUM-ALBUTEROL 0.5-2.5 (3) MG/3ML IN SOLN: 3.0000 mL | Freq: Four times a day (QID) | RESPIRATORY_TRACT | Status: DC | PRN

## 2014-08-30 MED ORDER — HYDROCORTISONE 1 % EX CREA
TOPICAL_CREAM | Freq: Three times a day (TID) | CUTANEOUS | Status: DC | PRN
  Administered 2014-08-30: 13:00:00 via TOPICAL
  Filled 2014-08-30 (×3): qty 28

## 2014-08-30 NOTE — Progress Notes (Addendum)
[address]       [place] [address]             [phone]      5 Days Post-Op Procedure(s) (LRB): CORONARY ARTERY BYPASS GRAFTING (CABG), ON PUMP, TIMES THREE, USING BILATERAL MAMMARY ARTERIES, RIGHT GREATER SAPHENOUS VEIN HARVESTED ENDOSCOPICALLY (N/A) TRANSESOPHAGEAL ECHOCARDIOGRAM (TEE) (N/A) Subjective: Doing well but conts to have right ankle pain/swelling, ambulating with walker  Objective: Vital signs in last 24 hours: Temp:  [97.7 F (36.5 C)-98 F (36.7 C)] 98 F (36.7 C) (05/16 0339) Pulse Rate:  [87-96] 87 (05/16 0339) Cardiac Rhythm:  [-] Normal sinus rhythm (05/16 0000) Resp:  [18] 18 (05/16 0339) BP: (121-140)/(62-77) 123/72 mmHg (05/16 0339) SpO2:  [82 %-96 %] 93 % (05/16 0339) Weight:  [166 lb 14.2 oz (75.7 kg)] 166 lb 14.2 oz (75.7 kg) (05/16 0339)  Hemodynamic parameters for last 24 hours:    Intake/Output from previous day: 05/15 0701 - 05/16 0700 In: 560 [P.O.:560] Out: 1250 [Urine:1250] Intake/Output this shift:    General appearance: alert, cooperative and no distress Heart: regular rate and rhythm Lungs: clear to auscultation bilaterally Abdomen: benign Extremities: right ankle edema Wound: small amt bloody drainage lower pole of incis, bone stable  Lab Results:  Recent Labs  08/28/14 0319  WBC 7.6  HGB 8.3*  HCT 27.6*  PLT 188   BMET:  Recent Labs  08/28/14 0319  NA 134*  K 3.6  CL 100*  CO2 27  GLUCOSE 106*  BUN 10  CREATININE 0.72  CALCIUM 8.3*    PT/INR: No results for input(s): LABPROT, INR in the last 72 hours. ABG    Component Value Date/Time   PHART 7.292* 08/25/2014 1655   HCO3 21.2 08/25/2014 1655   TCO2 21 08/26/2014 1630   ACIDBASEDEF 5.0* 08/25/2014 1655   O2SAT 92.0 08/25/2014 1655   CBG (last 3)   Recent Labs  08/28/14 2036 08/29/14 0607 08/29/14 1122  GLUCAP 115* 104* 127*    Meds Scheduled Meds: . acetaminophen  1,000 mg Oral 4 times per day   Or  .  acetaminophen (TYLENOL) oral liquid 160 mg/5 mL  1,000 mg Per Tube 4 times per day  . allopurinol  100 mg Oral BID  . aspirin EC  325 mg Oral Daily   Or  . aspirin  324 mg Per Tube Daily  . atorvastatin  40 mg Oral q1800  . bisacodyl  10 mg Oral Daily   Or  . bisacodyl  10 mg Rectal Daily  . colchicine  0.6 mg Oral BID  . docusate sodium  200 mg Oral Daily  . enoxaparin (LOVENOX) injection  40 mg Subcutaneous QHS  . furosemide  40 mg Oral Daily  . guaiFENesin  1,200 mg Oral BID  . ipratropium-albuterol  3 mL Inhalation QID  . lisinopril  2.5 mg Oral Daily  . metoprolol tartrate  25 mg Oral BID   Or  . metoprolol tartrate  25 mg Per Tube BID  . pantoprazole  40 mg Oral Daily  . sodium chloride  3 mL Intravenous Q12H   Continuous Infusions:  PRN Meds:.sodium chloride, ALPRAZolam, alum & mag hydroxide-simeth, magnesium hydroxide, ondansetron (ZOFRAN) IV, oxyCODONE, sodium chloride, traMADol, zolpidem  Xrays Dg Chest 2 View  08/28/2014   CLINICAL DATA:  Status post CABG surgery on 08/25/2014.  EXAM: CHEST  2 VIEW  COMPARISON:  08/27/2014.  FINDINGS: Small bilateral pleural effusions and atelectasis like the lung  bases obscuring the left a mostly obscuring the right hemidiaphragms.  There is no pulmonary edema. No convincing pneumonia. No pneumothorax.  Cardiac silhouette is borderline enlarged.  No mediastinal widening.  All lines and tubes have been removed.  IMPRESSION: 1. No acute findings. 2. No evidence of an operative complication. 3. Persistent small pleural effusions with associated lung base atelectasis. No pulmonary edema or convincing pneumonia.   Electronically Signed   By: Lajean Manes M.D.   On: 08/28/2014 11:30    Assessment/Plan: S/P Procedure(s) (LRB): CORONARY ARTERY BYPASS GRAFTING (CABG), ON PUMP, TIMES THREE, USING BILATERAL MAMMARY ARTERIES, RIGHT GREATER SAPHENOUS VEIN HARVESTED ENDOSCOPICALLY (N/A) TRANSESOPHAGEAL ECHOCARDIOGRAM (TEE) (N/A)   1 doing well-  cont gout rx 2 wean O2 3 gentle diuresis 4 rhythm/ hemodyn stable 5 poss d/c 1-2 days  LOS: 6 days    GOLD,WAYNE E 08/30/2014  Patient seen and examined, d/w Mr. Girtha Rm Agree with A/P as outlined above  Remo Lipps C. Roxan Hockey, MD [facility] [phone]

## 2014-08-30 NOTE — Discharge Instructions (Signed)
Coronary Artery Bypass Grafting, Care After Refer to this sheet in the next few weeks. These instructions provide you with information on caring for yourself after your procedure. Your health care provider may also give you more specific instructions. Your treatment has been planned according to current medical practices, but problems sometimes occur. Call your health care provider if you have any problems or questions after your procedure. WHAT TO EXPECT AFTER THE PROCEDURE Recovery from surgery will be different for everyone. Some people feel well after 3 or 4 weeks, while for others it takes longer. After your procedure, it is typical to have the following:  Nausea and a lack of appetite.   Constipation.  Weakness and fatigue.   Depression or irritability.   Pain or discomfort at your incision site. HOME CARE INSTRUCTIONS  Take medicines only as directed by your health care provider. Do not stop taking medicines or start any new medicines without first checking with your health care provider.  Take your pulse as directed by your health care provider.  Perform deep breathing as directed by your health care provider. If you were given a device called an incentive spirometer, use it to practice deep breathing several times a day. Support your chest with a pillow or your arms when you take deep breaths or cough.  Keep incision areas clean, dry, and protected. Remove or change any bandages (dressings) only as directed by your health care provider. You may have skin adhesive strips over the incision areas. Do not take the strips off. They will fall off on their own.  Check incision areas daily for any swelling, redness, or drainage.  If incisions were made in your legs, do the following:  Avoid crossing your legs.   Avoid sitting for long periods of time. Change positions every 30 minutes.   Elevate your legs when you are sitting.  Wear compression stockings as directed by your  health care provider. These stockings help keep blood clots from forming in your legs.  Take showers once your health care provider approves. Until then, only take sponge baths. Pat incisions dry. Do not rub incisions with a washcloth or towel. Do not take baths, swim, or use a hot tub until your health care provider approves.  Eat foods that are high in fiber, such as raw fruits and vegetables, whole grains, beans, and nuts. Meats should be lean cut. Avoid canned, processed, and fried foods.  Drink enough fluid to keep your urine clear or pale yellow.  Weigh yourself every day. This helps identify if you are retaining fluid that may make your heart and lungs work harder.  Rest and limit activity as directed by your health care provider. You may be instructed to:  Stop any activity at once if you have chest pain, shortness of breath, irregular heartbeats, or dizziness. Get help right away if you have any of these symptoms.  Move around frequently for short periods or take short walks as directed by your health care provider. Increase your activities gradually. You may need physical therapy or cardiac rehabilitation to help strengthen your muscles and build your endurance.  Avoid lifting, pushing, or pulling anything heavier than 10 lb (4.5 kg) for at least 6 weeks after surgery.  Do not drive until your health care provider approves.  Ask your health care provider when you may return to work.  Ask your health care provider when you may resume sexual activity.  Keep all follow-up visits as directed by your health care  provider. This is important. SEEK MEDICAL CARE IF:  You have swelling, redness, increasing pain, or drainage at the site of an incision.  You have a fever.  You have swelling in your ankles or legs.  You have pain in your legs.   You gain 2 or more pounds (0.9 kg) a day.  You are nauseous or vomit.  You have diarrhea. SEEK IMMEDIATE MEDICAL CARE IF:  You have  chest pain that goes to your jaw or arms.  You have shortness of breath.   You have a fast or irregular heartbeat.   You notice a "clicking" in your breastbone (sternum) when you move.   You have numbness or weakness in your arms or legs.  You feel dizzy or light-headed.  MAKE SURE YOU:  Understand these instructions.  Will watch your condition.  Will get help right away if you are not doing well or get worse. Document Released: 10/20/2004 Document Revised: 08/17/2013 Document Reviewed: 09/09/2012 [facility] Patient Information 2015 [place], [place]. This information is not intended to replace advice given to you by your health care provider. Make sure you discuss any questions you have with your health care provider.  Endoscopic Saphenous Vein Harvesting Care After Refer to this sheet in the next few weeks. These instructions provide you with information on caring for yourself after your procedure. Your health care provider may also give you more specific instructions. Your treatment has been planned according to current medical practices, but problems sometimes occur. Call your health care provider if you have any problems or questions after your procedure. HOME CARE INSTRUCTIONS Medicine  Take whatever pain medicine your surgeon prescribes. Follow the directions carefully. Do not take over-the-counter pain medicine unless your surgeon says it is okay. Some pain medicine can cause bleeding problems for several weeks after surgery.  Follow your surgeon's instructions about driving. You will probably not be permitted to drive after heart surgery.  Take any medicines your surgeon prescribes. Any medicines you took before your heart surgery should be checked with your health care provider before you start taking them again. Wound care  If your surgeon has prescribed an elastic bandage or stocking, ask how long you should wear it.  Check the area around your surgical cuts  (incisions) whenever your bandages (dressings) are changed. Look for any redness or swelling.  You will need to return to have the stitches (sutures) or staples taken out. Ask your surgeon when to do that.  Ask your surgeon when you can shower or bathe. Activity  Try to keep your legs raised when you are sitting.  Do any exercises your health care providers have given you. These may include deep breathing exercises, coughing, walking, or other exercises. SEEK MEDICAL CARE IF:  You have any questions about your medicines.  You have more leg pain, especially if your pain medicine stops working.  New or growing bruises develop on your leg.  Your leg swells, feels tight, or becomes red.  You have numbness in your leg. SEEK IMMEDIATE MEDICAL CARE IF:  Your pain gets much worse.  Blood or fluid leaks from any of the incisions.  Your incisions become warm, swollen, or red.  You have chest pain.  You have trouble breathing.  You have a fever.  You have more pain near your leg incision. MAKE SURE YOU:  Understand these instructions.  Will watch your condition.  Will get help right away if you are not doing well or get worse. Document Released: 12/13/2010  Document Revised: 04/07/2013 Document Reviewed: 12/13/2010 °[employer]® Patient Information ©2015 [employer]. This information is not intended to replace advice given to you by your health care provider. Make sure you discuss any questions you have with your health care provider. ° ° °

## 2014-08-30 NOTE — Care Management (Deleted)
Medicare Important Message given? Yes (If Response is "NO", the following Medicare IM given date fields will be blank) Date Medicare IM given:  08/30/14 Medicare IM given by: Elenor Quinones

## 2014-08-30 NOTE — Progress Notes (Signed)
CARDIAC REHAB PHASE I   PRE:  Rate/Rhythm: 94 SR  BP:  Supine:   Sitting: 150/78  Standing:    SaO2: 94% 2L  MODE:  Ambulation: 500 ft   POST:  Rate/Rhythm: 110  BP:  Supine:   Sitting: 140/82  Standing:    SaO2: 87%RA, 93% 2L  SATURATION QUALIFICATIONS: (This note is used to comply with regulatory documentation for home oxygen)  Patient Saturations on Room Air at Rest = 89-91%  Patient Saturations on Room Air while Ambulating = 87%  Patient Saturations on 2 Liters of oxygen while Ambulating = 93%  Please briefly explain why patient needs home oxygen: desat on RA  1030-1105 Pt had gotten med for right foot pain and wanted to wait 15-20 minutes before walking which we did. Pt walked 500 ft on 2L with rollling walker and asst x 1. Started out on RA and monitored sats which dropped to 87%.  Put on 2L which kept sats up. Pt stated he is using flutter valve and IS. Coughed up productively after walk. Will leave on oxygen at this time. Foot still uncomfortable with walk.    Graylon Good, RN BSN  08/30/2014 11:06 AM

## 2014-08-30 NOTE — Discharge Summary (Signed)
Physician Discharge Summary  Patient ID: Philip [name] MRN: [id number] DOB/AGE: 1960-09-04 54 y.o.  Admit date: 08/24/2014 Discharge date: 09/01/2014  Admission Diagnoses:  Patient Active Problem List   Diagnosis Date Noted  . ST elevation myocardial infarction (STEMI) of inferior wall 08/24/2014  . Coronary artery disease involving native coronary artery of native heart with unstable angina pectoris 08/24/2014  . CAD (coronary artery disease) 08/24/2014  . Right ankle pain 07/20/2014  . Right foot pain 07/06/2014  . Elevated blood pressure 07/06/2014  . Tobacco abuse 07/06/2014  . Seizures 07/06/2014  . Preventative health care 07/06/2014     Discharge Diagnoses:   Patient Active Problem List   Diagnosis Date Noted  . S/P CABG x 3 08/25/2014  . ST elevation myocardial infarction (STEMI) of inferior wall 08/24/2014  . Coronary artery disease involving native coronary artery of native heart with unstable angina pectoris 08/24/2014  . CAD (coronary artery disease) 08/24/2014  . Right ankle pain 07/20/2014  . Right foot pain 07/06/2014  . Elevated blood pressure 07/06/2014  . Tobacco abuse 07/06/2014  . Seizures 07/06/2014  . Preventative health care 07/06/2014     Discharged Condition: good  History of Present Illness:   Philip [name] is a 54 yo male who presented to the ED with complaints of chest pain.  This developed while patient was at work.  It came upon suddenly and radiated to his left arm and axilla.  He became diaphoretic.  Evaluation in the ED at [facility] did not initially show evidence of STEMI, however on arrival this progressed and code STEMI was activated.  He was taken emergently to the cath lab and found to have severe 2 vessel CAD.  It was felt Coronary Bypass grafting would be indicated.  He was placed on heparin, aggrastat, and NTG drips.  He had no further chest pain.  He was evaluated by Dr. Roxan Hockey who was in agreement patient would benefit from Coronary  Bypass procedure.  The risks and benefits of the procedure were explained to the patient and he was agreeable to proceed.    Hospital Course:   He was taken to the operating room on 08/25/2014.  He underwent CABG x 3 utilizing LIMA to LAD, SVG to Diagonal, and RIMA to RCA.  He also underwent endoscopic harvest of the greater saphenous vein from the right thigh.  He tolerated the procedure without difficulty and was taken to the SICU in stable condition.  The patient was extubated the evening of surgery.  During his stay in the SICU he was transfused a unit of packed cells for post operative blood loss anemia. His chest tubes and arterial lines were removed without difficulty.  He developed some PACs and his lopressor was increased.  He was ambulating without difficulty and transferred to the step down unit in stable condition.  He developed some hypertension and was placed on an ACE-I.  He developed pain in his Right foot.  This was felt to be gout and he was placed on his home gout medications.  He continues to have discomfort in his right foot which is chronic since prior to surgery and thought to be related to gout.  He is ambulating with assistance of a rolling walker.  He was maintaining NSR and his pacing wires have been removed without difficulty.  Should no issues arise and he remains stable we will plan for discharge home on 09/01/2014.         Significant Diagnostic Studies: Cardiac catheterization  Prox LAD lesion, 90% stenosed.  Mid LAD lesion, 95% stenosed.  1st Diag-1 lesion, 70% stenosed.  1st Diag-2 lesion, 70% stenosed.  Mid LAD to Dist LAD lesion, 50% stenosed.  Mid RCA lesion, 75% stenosed.   Treatments: surgery:   Median sternotomy, extracorporeal circulation, coronary artery bypass grafting x3 (left internal mammary artery to left anterior descending, saphenous vein graft to first diagonal, right internal mammary artery to right coronary artery), endoscopic vein  harvest, right thigh.  Disposition: Home   Discharge Medications:   Medication List    STOP taking these medications        HYDROcodone-acetaminophen 5-325 MG per tablet  Commonly known as:  NORCO/VICODIN      TAKE these medications        allopurinol 100 MG tablet  Commonly known as:  ZYLOPRIM  Take 100 mg by mouth 2 (two) times daily.     aspirin 325 MG EC tablet  Take 1 tablet (325 mg total) by mouth daily.     atorvastatin 40 MG tablet  Commonly known as:  LIPITOR  Take 1 tablet (40 mg total) by mouth daily.     colchicine 0.6 MG tablet  Take 1.2 mg (2 pills) then 12 hours later take 0.6 mg (1 pill).  Then take 0.6 mg 2x per day for up to 6 months     diphenhydrAMINE 25 mg capsule  Commonly known as:  BENADRYL  Take 25 mg by mouth 2 (two) times daily as needed for allergies.     furosemide 40 MG tablet  Commonly known as:  LASIX  Take 1 tablet (40 mg total) by mouth daily. X 5 days     lisinopril 5 MG tablet  Commonly known as:  PRINIVIL,ZESTRIL  Take 1 tablet (5 mg total) by mouth daily.     meloxicam 7.5 MG tablet  Commonly known as:  MOBIC  Take 7.5 mg by mouth 2 (two) times daily as needed (inflammation).     metoprolol tartrate 25 MG tablet  Commonly known as:  LOPRESSOR  Take 1 tablet (25 mg total) by mouth 2 (two) times daily.     omeprazole 20 MG tablet  Commonly known as:  PRILOSEC OTC  Take 20 mg by mouth daily.     oxyCODONE 5 MG immediate release tablet  Commonly known as:  Oxy IR/ROXICODONE  Take 1-2 tablets (5-10 mg total) by mouth every 3 (three) hours as needed for severe pain.        The patient has been discharged on:   1.Beta Blocker:  Yes [ x  ]                              No   [   ]                              If No, reason:  2.Ace Inhibitor/ARB: Yes [ x  ]                                     No  [    ]  If No, reason:  3.Statin:   Yes [ x  ]                  No  [   ]                   If No, reason:  4.Ecasa:  Yes  [  x ]                  No   [   ]                  If No, reason:   Follow Up: Follow-up Information    Follow up with Melrose Nakayama, MD On 09/28/2014.   Specialty:  Cardiothoracic Surgery   Why:  Appointment is at 1:00   Contact information:   [address] [place] [address] [phone]       Follow up with [facility] On 09/28/2014.   Why:  Please get CXR at 12:30   Contact information:   [facility]       Follow up with Isaias Cowman, MD.   Specialty:  Cardiology   Why:  Please contact office to set up follow up appointment   Contact information:   [place] [address] [phone]       Signed: Burke Keels 09/01/2014, 9:22 AM

## 2014-08-31 MED ORDER — LEVALBUTEROL HCL 0.63 MG/3ML IN NEBU: 0.6300 mg | INHALATION_SOLUTION | Freq: Three times a day (TID) | RESPIRATORY_TRACT | Status: DC

## 2014-08-31 MED ORDER — DIPHENHYDRAMINE HCL 25 MG PO CAPS
25.0000 mg | ORAL_CAPSULE | Freq: Four times a day (QID) | ORAL | Status: DC | PRN
  Administered 2014-08-31 (×2): 25 mg via ORAL
  Filled 2014-08-31 (×2): qty 1

## 2014-08-31 MED ORDER — LISINOPRIL 5 MG PO TABS
5.0000 mg | ORAL_TABLET | Freq: Every day | ORAL | Status: DC
  Administered 2014-08-31 – 2014-09-01 (×2): 5 mg via ORAL
  Filled 2014-08-31 (×2): qty 1

## 2014-08-31 NOTE — Progress Notes (Signed)
UR Completed. Shaden Lacher, RN, BSN.  [phone] 

## 2014-08-31 NOTE — Progress Notes (Signed)
CARDIAC REHAB PHASE I   PRE:  Rate/Rhythm: 96 SR  BP:  Supine: 124/80  Sitting:   Standing:    SaO2: 94% 2L, 87%RA  MODE:  Ambulation: 690 ft   POST:  Rate/Rhythm: 101  BP:  Supine:   Sitting: 140/80  Standing:    SaO2: 95% 2L 1020-1100 Pt desat to 87%RA lying in bed. Walked 690 ft on 2L with rolling walker with steady gait. Foot still sore. Would like rolling walker for home use. To chair after walk.  Will check sats on RA again tomorrow. May need home oxygen.   Graylon Good, RN BSN  08/31/2014 10:56 AM

## 2014-08-31 NOTE — Progress Notes (Signed)
Epicardial pacing wires X2 removed per protocol without difficulty. Pt tolerated well. VSS. Rhythm stable. Pt on bed rest for 1 hour with VS monitoring. Pt and wife educated.

## 2014-08-31 NOTE — Progress Notes (Signed)
Patient ambulated in hallway 700 ft with front wheel rolling walker on 2L of O2; wife ambulated with patient; tolerated walk well.  Patient is currently sitting in room on 1L at 92%; will continue to try and wean patient off oxygen.  Wife at bedside, call bell within reach.  Rowe Pavy, RN

## 2014-08-31 NOTE — Progress Notes (Addendum)
[address]       [place] [address]             [phone]      6 Days Post-Op Procedure(s) (LRB): CORONARY ARTERY BYPASS GRAFTING (CABG), ON PUMP, TIMES THREE, USING BILATERAL MAMMARY ARTERIES, RIGHT GREATER SAPHENOUS VEIN HARVESTED ENDOSCOPICALLY (N/A) TRANSESOPHAGEAL ECHOCARDIOGRAM (TEE) (N/A) Subjective: Ankle about the same but mobilizing pretty well. Some productive sputum/cough.   Objective: Vital signs in last 24 hours: Temp:  [97.7 F (36.5 C)-98.1 F (36.7 C)] 98 F (36.7 C) (05/17 0354) Pulse Rate:  [80-93] 80 (05/17 0354) Cardiac Rhythm:  [-] Normal sinus rhythm (05/16 2300) Resp:  [18] 18 (05/17 0354) BP: (106-139)/(53-90) 139/90 mmHg (05/17 0354) SpO2:  [93 %-97 %] 95 % (05/17 0354) Weight:  [164 lb 14.5 oz (74.8 kg)] 164 lb 14.5 oz (74.8 kg) (05/17 0346)  Hemodynamic parameters for last 24 hours:    Intake/Output from previous day: 05/16 0701 - 05/17 0700 In: 840 [P.O.:840] Out: 1201 [Urine:1200; Stool:1] Intake/Output this shift:    General appearance: alert, cooperative and no distress Heart: regular rate and rhythm Lungs: scattered ronchi, wheeze Abdomen: benign Extremities: rifgt ankle edema slightly improved Wound: incis healing well  Lab Results: No results for input(s): WBC, HGB, HCT, PLT in the last 72 hours. BMET: No results for input(s): NA, K, CL, CO2, GLUCOSE, BUN, CREATININE, CALCIUM in the last 72 hours.  PT/INR: No results for input(s): LABPROT, INR in the last 72 hours. ABG    Component Value Date/Time   PHART 7.292* 08/25/2014 1655   HCO3 21.2 08/25/2014 1655   TCO2 21 08/26/2014 1630   ACIDBASEDEF 5.0* 08/25/2014 1655   O2SAT 92.0 08/25/2014 1655   CBG (last 3)   Recent Labs  08/28/14 2036 08/29/14 0607 08/29/14 1122  GLUCAP 115* 104* 127*    Meds Scheduled Meds: . allopurinol  100 mg Oral BID  . aspirin EC  325 mg Oral Daily   Or  . aspirin  324 mg Per Tube Daily  . atorvastatin  40 mg  Oral q1800  . bisacodyl  10 mg Oral Daily   Or  . bisacodyl  10 mg Rectal Daily  . colchicine  0.6 mg Oral BID  . docusate sodium  200 mg Oral Daily  . enoxaparin (LOVENOX) injection  40 mg Subcutaneous QHS  . furosemide  40 mg Oral Daily  . guaiFENesin  1,200 mg Oral BID  . lisinopril  2.5 mg Oral Daily  . metoprolol tartrate  25 mg Oral BID   Or  . metoprolol tartrate  25 mg Per Tube BID  . pantoprazole  40 mg Oral Daily  . sodium chloride  3 mL Intravenous Q12H   Continuous Infusions:  PRN Meds:.sodium chloride, ALPRAZolam, alum & mag hydroxide-simeth, hydrocortisone cream, ipratropium-albuterol, magnesium hydroxide, ondansetron (ZOFRAN) IV, oxyCODONE, sodium chloride, traMADol, zolpidem  Xrays No results found.  Assessment/Plan: S/P Procedure(s) (LRB): CORONARY ARTERY BYPASS GRAFTING (CABG), ON PUMP, TIMES THREE, USING BILATERAL MAMMARY ARTERIES, RIGHT GREATER SAPHENOUS VEIN HARVESTED ENDOSCOPICALLY (N/A) TRANSESOPHAGEAL ECHOCARDIOGRAM (TEE) (N/A)  1 steady progress 2 conts to require some O2, some early bronchitis. Wean as able, cont nebs 3 cont gout tx 4 increase lisinopril a little , remains in sinus   LOS: 7 days    GOLD,WAYNE E 08/31/2014  Patient seen and examined, agree with above Wean O2 Dc pacing wires Home tomorrow if he continues to progress  Remo Lipps C. Roxan Hockey, MD [facility] [phone]

## 2014-09-01 MED ORDER — ASPIRIN 325 MG PO TBEC: 325.0000 mg | DELAYED_RELEASE_TABLET | Freq: Every day | ORAL | Status: DC

## 2014-09-01 MED ORDER — COLCHICINE 0.6 MG PO TABS: ORAL_TABLET | ORAL | Status: DC

## 2014-09-01 MED ORDER — OXYCODONE HCL 5 MG PO TABS: 5.0000 mg | ORAL_TABLET | ORAL | Status: DC | PRN

## 2014-09-01 MED ORDER — METOPROLOL TARTRATE 25 MG PO TABS: 25.0000 mg | ORAL_TABLET | Freq: Two times a day (BID) | ORAL | Status: DC

## 2014-09-01 MED ORDER — LISINOPRIL 5 MG PO TABS: 5.0000 mg | ORAL_TABLET | Freq: Every day | ORAL | Status: DC

## 2014-09-01 MED ORDER — ATORVASTATIN CALCIUM 40 MG PO TABS: 40.0000 mg | ORAL_TABLET | Freq: Every day | ORAL | Status: DC

## 2014-09-01 MED ORDER — FUROSEMIDE 40 MG PO TABS: 40.0000 mg | ORAL_TABLET | Freq: Every day | ORAL | Status: DC

## 2014-09-01 NOTE — Progress Notes (Signed)
CARDIAC REHAB PHASE I   PRE:  Rate/Rhythm: 84 SR  BP:  Supine: 120/60  Sitting:   Standing:    SaO2:89%RA  MODE:  Ambulation: 550 ft   POST:  Rate/Rhythm: 106  BP:  Supine: 120/70  Sitting:   Standing:    SaO2: 89-91%RA 0955-1051 Checked sats during walk and maintained 89-91% with no DOE. Tolerated well. Pt coughing productively. Encouraged IS and fluttter valve. Discussed smoking cessation and fake cigarette given. Gave handouts. Discussed CRP 2 and pt declined due to transportation issues. Education competed with pt and wife who voiced understanding.  Graylon Good, RN BSN  09/01/2014 10:47 AM

## 2014-09-01 NOTE — Progress Notes (Signed)
Sutures removed. Patient given discharge paperwork and prescriptions.

## 2014-09-01 NOTE — Care Management Note (Signed)
Case Management Note  Patient Details  Name: Philip [name] MRN: [id number] Date of Birth: [birth year]  Subjective/Objective:      Pt admitted CP    Action/Plan:   Pt had CABG performed 08/25/14.  CM will continue to monitor for disposition needs.   Expected Discharge Date:                  Expected Discharge Plan:  Home/Self Care  In-House Referral:     Discharge planning Services  CM Consult  Post Acute Care Choice:  Durable Medical Equipment Choice offered to:  Patient  DME Arranged:  [facility] DME Agency:  [place]:    [place]:     Status of Service:   Complete, will sign off  Medicare Important Message Given:  No Date Medicare IM Given:    Medicare IM give by:    Date Additional Medicare IM Given:    Additional Medicare Important Message give by:     If discussed at [place] of Stay Meetings, dates discussed:    Disposition Plan:  Home, Self Care  Additional Comments:  08/31/17 Elenor Quinones, RN, BSN [phone].  CM informed by cardiac rehab that pt requests rollator.  CM provided DME choice to pt, pt chose [facility].  CM verified address and phone number in [place].  CM contacted [facility], referral was accepted.  CM requested bedside nurse to request MD for DME order per pt/cardiac rehab request.  Per bedside nurse; pt has been on room air overnight, no need for HH O2.  No additonal CM needs at this time.    Maryclare Labrador, RN 09/01/2014, 9:48 AM

## 2014-09-01 NOTE — Progress Notes (Signed)
       [address]       [place] [address]             [phone]          7 Days Post-Op Procedure(s) (LRB): CORONARY ARTERY BYPASS GRAFTING (CABG), ON PUMP, TIMES THREE, USING BILATERAL MAMMARY ARTERIES, RIGHT GREATER SAPHENOUS VEIN HARVESTED ENDOSCOPICALLY (N/A) TRANSESOPHAGEAL ECHOCARDIOGRAM (TEE) (N/A)  Subjective: Feels well, off O2. Ready to go home.   Objective: Vital signs in last 24 hours: Patient Vitals for the past 24 hrs:  BP Temp Temp src Pulse Resp SpO2 Weight  09/01/14 0508 134/77 mmHg 97.7 F (36.5 C) Oral 87 18 92 % 160 lb 15 oz (73 kg)  08/31/14 2112 105/61 mmHg 97.9 F (36.6 C) Oral 82 18 95 % -  08/31/14 1824 - - - - - 92 % -  08/31/14 1430 123/78 mmHg 97.7 F (36.5 C) Oral 82 20 96 % -  08/31/14 1415 119/75 mmHg - - 84 - - -  08/31/14 1400 136/77 mmHg - - 81 - - -  08/31/14 1345 115/71 mmHg - - 79 - - -  08/31/14 1331 120/69 mmHg - - 80 - - -  08/31/14 1323 121/70 mmHg - - 76 - - -  08/31/14 0955 134/68 mmHg - - 85 - - -   Current Weight  09/01/14 160 lb 15 oz (73 kg)     Intake/Output from previous day: 05/17 0701 - 05/18 0700 In: 240 [P.O.:240] Out: 1000 [Urine:1000]    PHYSICAL EXAM:  Heart: RRR Lungs: Few coarse BS in bases that clear with cough Wound: Intact, minimal bloody drainage from lower sternum, Sternum stable. Extremities: No LE edema    Lab Results: CBC:No results for input(s): WBC, HGB, HCT, PLT in the last 72 hours. BMET: No results for input(s): NA, K, CL, CO2, GLUCOSE, BUN, CREATININE, CALCIUM in the last 72 hours.  PT/INR: No results for input(s): LABPROT, INR in the last 72 hours.    Assessment/Plan: S/P Procedure(s) (LRB): CORONARY ARTERY BYPASS GRAFTING (CABG), ON PUMP, TIMES THREE, USING BILATERAL MAMMARY ARTERIES, RIGHT GREATER SAPHENOUS VEIN HARVESTED ENDOSCOPICALLY (N/A) TRANSESOPHAGEAL ECHOCARDIOGRAM (TEE) (N/A) Pulm- Off O2, sats stable. CV- BPs stable and improved. Gout-  remains symptomatic, but ambulating.  Continue current care. D/c home today- instructions reviewed with pt.   LOS: 8 days    Philip [name] H 09/01/2014

## 2014-09-06 ENCOUNTER — Other Ambulatory Visit: Payer: Self-pay | Admitting: *Deleted

## 2014-09-06 DIAGNOSIS — G8918 Other acute postprocedural pain: Secondary | ICD-10-CM

## 2014-09-06 MED ORDER — OXYCODONE HCL 5 MG PO TABS: 5.0000 mg | ORAL_TABLET | ORAL | Status: DC | PRN

## 2014-09-06 NOTE — Telephone Encounter (Signed)
Mrs. Kost called and left a message that Mr. Philip [name] nreeded a pain med refill for Oxycodone after CABG.   I called Mr. Westbrook and explained that someone would need to come to the office to pickup a new script and he understood.

## 2014-09-10 ENCOUNTER — Telehealth: Payer: Self-pay | Admitting: *Deleted

## 2014-09-10 NOTE — Telephone Encounter (Signed)
Wife called about pt - still having diarrhea after meals. Had diarrhea in hospital for bypass surgery. Appetite still poor. H/a past 4 days - pt thinks sinus. Talked with Dr Lynnae January - suggest appt 09/14/14 2:15PM Dr Genene Churn. Needs to bring in stool spec. Can try NSS spray till appt. If any change - needs to go to ER per Dr Lynnae January. Hilda Blades Ashantia Amaral RN 09/10/14 3PM

## 2014-09-14 ENCOUNTER — Ambulatory Visit (INDEPENDENT_AMBULATORY_CARE_PROVIDER_SITE_OTHER): Payer: No Typology Code available for payment source | Admitting: Internal Medicine

## 2014-09-14 ENCOUNTER — Other Ambulatory Visit: Payer: Self-pay | Admitting: *Deleted

## 2014-09-14 ENCOUNTER — Encounter: Payer: Self-pay | Admitting: Internal Medicine

## 2014-09-14 VITALS — BP 111/74 | HR 75 | Temp 97.8°F | Wt 160.6 lb

## 2014-09-14 DIAGNOSIS — F17210 Nicotine dependence, cigarettes, uncomplicated: Secondary | ICD-10-CM

## 2014-09-14 DIAGNOSIS — G8918 Other acute postprocedural pain: Secondary | ICD-10-CM

## 2014-09-14 DIAGNOSIS — R21 Rash and other nonspecific skin eruption: Secondary | ICD-10-CM

## 2014-09-14 DIAGNOSIS — Z720 Tobacco use: Secondary | ICD-10-CM

## 2014-09-14 DIAGNOSIS — J019 Acute sinusitis, unspecified: Secondary | ICD-10-CM | POA: Diagnosis not present

## 2014-09-14 DIAGNOSIS — J0140 Acute pansinusitis, unspecified: Secondary | ICD-10-CM

## 2014-09-14 MED ORDER — FLUTICASONE PROPIONATE 50 MCG/ACT NA SUSP: 1.0000 | Freq: Every day | NASAL | Status: DC

## 2014-09-14 MED ORDER — HYDROCORTISONE VALERATE 0.2 % EX OINT: 1.0000 "application " | TOPICAL_OINTMENT | Freq: Two times a day (BID) | CUTANEOUS | Status: DC

## 2014-09-14 MED ORDER — OXYCODONE HCL 5 MG PO TABS: 5.0000 mg | ORAL_TABLET | ORAL | Status: DC | PRN

## 2014-09-14 NOTE — Patient Instructions (Addendum)
Please use the Hydrocortisone valerate (Westcort) 2-3 times a day on the back. Let us know if you need any refill. Keep using the benadryl for itching too.  Use Flonase nasal spray and also over the counter nasal saline spray for your sinusitis. If you don't feel better within 1-2 more weeks or you feel worse and develop a fever, let us know.  Work on the tobacco cessation. Let us know if you want the nicotine products.

## 2014-09-14 NOTE — Progress Notes (Signed)
   Subjective:    Patient ID: Philip [name], male    DOB: February 09, 1961, 54 y.o.   MRN: [id number]  HPI  54 yo male gout, HTN, recent STEMI s/p triple bypass earlier this month, tobacco abuse here with sinus problem and to discuss tobacco cessation.  Has been having sinus pressure, headache, cough, runny nose. No fever or sick contacts.   Smoking 4 cigs a day, down from 2 PPD.   See problem based charting.  Review of Systems  Constitutional: Negative for fever, chills and fatigue.  HENT: Positive for congestion, rhinorrhea and sinus pressure. Negative for ear pain, facial swelling, nosebleeds, sore throat and voice change.   Eyes: Negative for visual disturbance.  Respiratory: Positive for cough. Negative for shortness of breath and wheezing.   Gastrointestinal: Negative for nausea, vomiting and diarrhea.  Endocrine: Negative for polyuria.  Musculoskeletal: Negative for back pain and neck pain.  Skin: Negative.   Allergic/Immunologic: Negative.   Neurological: Positive for headaches. Negative for dizziness, weakness and numbness.  Hematological: Negative.   Psychiatric/Behavioral: Negative.        Objective:   Physical Exam  Constitutional: He is oriented to person, place, and time. He appears well-developed and well-nourished. No distress.  HENT:  Head: Normocephalic and atraumatic.  Right Ear: External ear normal.  Left Ear: External ear normal.  Mouth/Throat: Oropharynx is clear and moist. No oropharyngeal exudate.  Has sinus tenderness on frontal and maxillary area.   Eyes: Conjunctivae are normal. Pupils are equal, round, and reactive to light. Right eye exhibits no discharge. Left eye exhibits no discharge.  Neck: Normal range of motion.  Cardiovascular: Normal rate and regular rhythm.  Exam reveals no friction rub.   No murmur heard. Has well healing surgical scar from recent bypass.  Pulmonary/Chest: Effort normal. No respiratory distress. He has no wheezes.    Abdominal: Soft. Bowel sounds are normal. He exhibits no distension and no mass.  Musculoskeletal: Normal range of motion. He exhibits no edema or tenderness.  Neurological: He is alert and oriented to person, place, and time.  Skin: He is not diaphoretic.  Has diffuse maculopapular rash on the back.     Filed Vitals:   09/14/14 1426  BP: 111/74  Pulse: 75  Temp: 97.8 F (36.6 C)        Assessment & Plan:  See problem a&p.

## 2014-09-14 NOTE — Assessment & Plan Note (Signed)
Patient used to smoke 2 PPD but has cut down to 4 cigs daily now. Wants further assistance with quitting. He stated that he tried the nicotine gums and patches from his friends before and it caused jittery and nervousness and does not want to try that. He is also scared to try the Chantix because of all the side effects that he heard about from his friends. He wanted to try the Wellbutrin but he has hx of seizures so that's contraindicated.  i did not have any further recs for him at this point. I asked him to let us know if he wants to try the nicotine patches or gums again.

## 2014-09-14 NOTE — Assessment & Plan Note (Signed)
Has maculopapular rash since hospitalization. Was treated with hydrocortisone cream per patient in the hospital along with benadryl OTC. The cream did help somewhat with clearing the rash (used about 2 weeks). benadrayl is also helping somewhat.   Has diffuse maculopapular rash throughout his back on my exam. Could be moisture related rash that's seen not infrequently in hospitalized patients from excessive warmth or sweating.   I prescribed WESTCORT (hydrocortisone-valerate ointment 0.2% 2-3 times a day). Also asked to continue PRN OTC benadryl. If not better in 2-3 more weeks, may consider derm referral.

## 2014-09-14 NOTE — Assessment & Plan Note (Signed)
Has cough, runny nose, sinus pressure, headache for 1 week. No fever or sick contacts. Patient does not appear septic. has sinus tenderness on exam over frontal and maxillary sinuses.  This is likely viral sinusitis. Asked him to take tylenol for headache, to use flonase + saline nasal spray.   Asked him to notify us if not better in 1-2 weeks or he develops fever or feels worse overall.

## 2014-09-15 ENCOUNTER — Encounter: Payer: Self-pay | Admitting: Internal Medicine

## 2014-09-20 ENCOUNTER — Other Ambulatory Visit: Payer: Self-pay | Admitting: *Deleted

## 2014-09-20 DIAGNOSIS — G8918 Other acute postprocedural pain: Secondary | ICD-10-CM

## 2014-09-20 MED ORDER — OXYCODONE HCL 5 MG PO TABS: 5.0000 mg | ORAL_TABLET | ORAL | Status: DC | PRN

## 2014-09-20 NOTE — Progress Notes (Signed)
Case discussed with Dr. Ahmed at time of visit. We reviewed the resident's history and exam and pertinent patient test results. I agree with the assessment, diagnosis, and plan of care documented in the resident's note. 

## 2014-09-20 NOTE — Telephone Encounter (Signed)
Mrs. Whittlesey called requesting a refill for Oxycodone for her husband s/p CABG X 3 08/25/14. I informed her that a new script would be available today at the front desk and she agreed.

## 2014-09-24 ENCOUNTER — Telehealth: Payer: Self-pay

## 2014-09-24 DIAGNOSIS — G8918 Other acute postprocedural pain: Secondary | ICD-10-CM

## 2014-09-24 MED ORDER — OXYCODONE HCL 5 MG PO TABS: 5.0000 mg | ORAL_TABLET | ORAL | Status: DC | PRN

## 2014-09-24 NOTE — Telephone Encounter (Signed)
RX refill for oxycodone 5 mg #40 given, RX left at front desk for pick up today.

## 2014-09-27 ENCOUNTER — Other Ambulatory Visit: Payer: Self-pay | Admitting: Thoracic Surgery (Cardiothoracic Vascular Surgery)

## 2014-09-27 DIAGNOSIS — Z951 Presence of aortocoronary bypass graft: Secondary | ICD-10-CM

## 2014-09-28 ENCOUNTER — Ambulatory Visit
Admission: RE | Admit: 2014-09-28 | Discharge: 2014-09-28 | Disposition: A | Discharge status: Home / Self Care | Payer: No Typology Code available for payment source | Source: Ambulatory Visit | Attending: Thoracic Surgery (Cardiothoracic Vascular Surgery) | Admitting: Thoracic Surgery (Cardiothoracic Vascular Surgery)

## 2014-09-28 ENCOUNTER — Ambulatory Visit (INDEPENDENT_AMBULATORY_CARE_PROVIDER_SITE_OTHER): Payer: Self-pay | Admitting: Thoracic Surgery (Cardiothoracic Vascular Surgery)

## 2014-09-28 ENCOUNTER — Encounter: Payer: Self-pay | Admitting: Thoracic Surgery (Cardiothoracic Vascular Surgery)

## 2014-09-28 VITALS — BP 133/84 | HR 86 | Resp 20 | Ht 70.0 in | Wt 160.0 lb

## 2014-09-28 DIAGNOSIS — I213 ST elevation (STEMI) myocardial infarction of unspecified site: Secondary | ICD-10-CM

## 2014-09-28 DIAGNOSIS — Z951 Presence of aortocoronary bypass graft: Secondary | ICD-10-CM

## 2014-09-28 DIAGNOSIS — G8918 Other acute postprocedural pain: Secondary | ICD-10-CM

## 2014-09-28 DIAGNOSIS — I2510 Atherosclerotic heart disease of native coronary artery without angina pectoris: Secondary | ICD-10-CM

## 2014-09-28 MED ORDER — OXYCODONE HCL 5 MG PO TABS: 5.0000 mg | ORAL_TABLET | ORAL | Status: DC | PRN

## 2014-09-28 NOTE — Progress Notes (Signed)
[address]       [place] [address]             [phone]       HPI:  Philip [name] returns today for a scheduled postoperative follow-up visit.  He is a 54 year old man with history of tobacco abuse and hypertension. He presented [facility] with an ST elevation MI in May of this year. He underwent coronary bypass grafting 3 with bilateral mammaries on 08/25/2014.  Postoperatively his progress was limited by right foot and ankle pain. This is a chronic problem that has been going on since December of last year.  He says that he continues to have a lot of problems with right foot and ankle pain. He was told he might have a Charcot's joint. That is still being evaluated. He has not had any recurrent anginal pain. He says that he is taking one pain pill during the day and 2 before he goes to bed at night, and that is as much for his foot as it is for his incision. Incisional pain in his chest is really only severe when he rolls onto his side.  He had questions about seeing a cardiologist here in [place]. I encouraged him to stay with Dr. Saralyn Pilar in [place]  Past Medical History  Diagnosis Date  . Foot pain   . ST elevation myocardial infarction (STEMI) of inferior wall 08/24/2014  . Coronary artery disease involving native coronary artery of native heart with unstable angina pectoris 08/24/2014      Current Outpatient Prescriptions  Medication Sig Dispense Refill  . aspirin EC 325 MG EC tablet Take 1 tablet (325 mg total) by mouth daily. 30 tablet 0  . atorvastatin (LIPITOR) 40 MG tablet Take 1 tablet (40 mg total) by mouth daily. 30 tablet 1  . diphenhydrAMINE (BENADRYL) 25 mg capsule Take 25 mg by mouth 2 (two) times daily as needed for allergies.     . fluticasone (FLONASE) 50 MCG/ACT nasal spray Place 1 spray into both nostrils daily. 16 g 2  . hydrocortisone valerate ointment (WESTCORT) 0.2 % Apply 1 application topically 2 (two) times daily.  45 g 0  . lisinopril (PRINIVIL,ZESTRIL) 5 MG tablet Take 1 tablet (5 mg total) by mouth daily. 30 tablet 1  . meloxicam (MOBIC) 7.5 MG tablet Take 7.5 mg by mouth 2 (two) times daily as needed (inflammation).     . metoprolol tartrate (LOPRESSOR) 25 MG tablet Take 1 tablet (25 mg total) by mouth 2 (two) times daily. 60 tablet 1  . omeprazole (PRILOSEC OTC) 20 MG tablet Take 20 mg by mouth daily.    Marland Kitchen oxyCODONE (OXY IR/ROXICODONE) 5 MG immediate release tablet Take 1-2 tablets (5-10 mg total) by mouth every 4 (four) hours as needed for severe pain. 40 tablet 0   No current facility-administered medications for this visit.    Physical Exam BP 133/84 mmHg  Pulse 86  Resp 20  Ht '5\' 10"'$  (1.778 m)  Wt 160 lb (72.576 kg)  BMI 22.96 kg/m2  SpO57 49% 54 year old man in no acute distress Well-developed well-nourished Alert and oriented 3 with no focal deficits Poor dentition Cardiac regular rate and rhythm normal S1 and S2 Sternal incision clean dry and intact, sternum stable Lungs clear with equal breath sounds bilaterally Right lower extremity in boot  Diagnostic Tests: I personally reviewed his chest x-ray. It shows postoperative changes. There is no significant effusion.  Impression: 54 year old man with multiple  cardiac risk factors who is status post ST elevation MI. He had severe two-vessel disease and underwent coronary bypass grafting 3 with bilateral mammaries on 08/25/2014. He is doing well from a cardiac standpoint.  He does still have some incisional pain. I gave him an additional prescription for oxycodone, 5 mg tablets, one to 2 tablets 4 times daily as needed for pain, dispense 40 tablets, no refills.  He was advised not to lift anything over 10 pounds for another 2 weeks. After that he can build up gradually.  He had questions about returning to work where he has to lift items weighing as much as 80 pounds. I advised him that he should wait until at least 3 months from the  time surgery before doing that. He had questions about disability related to his ankle/ foot. I cannot give him any advice on that.  He has an appointment with Dr. Saralyn Pilar. I emphasized the importance of following up with him.  Plan: Follow-up with Dr. Isaias Cowman  I will be happy to see Mr. Laura back at any time if I can be of any further assistance with his care  Melrose Nakayama, MD [facility] [phone]

## 2014-10-05 DIAGNOSIS — I10 Essential (primary) hypertension: Secondary | ICD-10-CM | POA: Insufficient documentation

## 2014-10-05 DIAGNOSIS — E7800 Pure hypercholesterolemia, unspecified: Secondary | ICD-10-CM | POA: Insufficient documentation

## 2014-10-08 DIAGNOSIS — I2119 ST elevation (STEMI) myocardial infarction involving other coronary artery of inferior wall: Secondary | ICD-10-CM

## 2014-10-08 NOTE — Discharge Summary (Signed)
   Physician Discharge Summary  Patient ID: IDAN PRIME MRN: [id number] DOB/AGE: 15-Oct-1960 54 y.o.  Admit date: 08/24/2014 Discharge date: 10/08/2014  Primary Discharge Diagnosis Inferior ST-elevation myocardial infarction Secondary Discharge Diagnosis  Coronary artery disease  Significant Diagnostic Studies:  Cardiac catheterization surgeon  Consults:  Hospital Course:  The patient presented to [facility] emergency room following a prolonged episode of chest pain.  EKG revealed evidence for acute inferior ST-elevation myocardial infarction.  The patient underwent emergent cardiac catheterization which revealed complex 90% stenosis proximal LAD, 95% stenosis mid LAD, 70% stenosis D 1 and 75% stenosis RCA.  The patient was urgently transferred to [facility] for coronary artery bypass graft surgery.  The patient was clinically and hemodynamically stable upon transfer.   Discharge Exam: Blood pressure 138/96, pulse 96, temperature 97.5 F (36.4 C), temperature source Oral, resp. rate 15, height '5\' 10"'$  (1.778 m), weight 77.565 kg (171 lb), SpO2 99 %.    General appearance: alert Head: atraumatic Eyes: conjunctivae/corneas clear. PERRL, EOM's intact. Fundi benign. Ears: normal TM's and external ear canals both ears Nose: Nares normal. Septum midline. Mucosa normal. No drainage or sinus tenderness. Neck: no adenopathy, no carotid bruit, no JVD, supple, symmetrical, trachea midline and thyroid not enlarged, symmetric, no tenderness/mass/nodules Resp: clear to auscultation bilaterally Chest wall: no tenderness Cardio: regular rate and rhythm GI: soft, non-tender; bowel sounds normal; no masses,  no organomegaly Pulses: 2+ and symmetric Neurologic: Alert and oriented X 3, normal strength and tone. Normal symmetric reflexes. Normal coordination and gait   Labs:   Lab Results  Component Value Date   WBC 7.6 08/28/2014   HGB 8.3* 08/28/2014   HCT 27.6* 08/28/2014   MCV 74.2*  08/28/2014   PLT 188 08/28/2014   No results for input(s): NA, K, CL, CO2, BUN, CREATININE, CALCIUM, PROT, BILITOT, ALKPHOS, ALT, AST, GLUCOSE in the last 168 hours.  Invalid input(s): LABALBU    Radiology:  EKG:   ST elevations inferiorly  FOLLOW UP PLANS AND APPOINTMENTS    Medication List    ASK your doctor about these medications        diphenhydrAMINE 25 mg capsule  Commonly known as:  BENADRYL  Take 25 mg by mouth 2 (two) times daily as needed for allergies.     meloxicam 7.5 MG tablet  Commonly known as:  MOBIC  Take 7.5 mg by mouth 2 (two) times daily as needed (inflammation).     omeprazole 20 MG tablet  Commonly known as:  PRILOSEC OTC  Take 20 mg by mouth daily.         BRING ALL MEDICATIONS WITH YOU TO FOLLOW UP APPOINTMENTS  Time spent with patient to include physician time: 30 min Signed:  Clement Deneault MD, PhD, [facility] 10/08/2014, 10:45 AM

## 2014-11-18 ENCOUNTER — Ambulatory Visit (INDEPENDENT_AMBULATORY_CARE_PROVIDER_SITE_OTHER): Payer: No Typology Code available for payment source | Admitting: Internal Medicine

## 2014-11-18 ENCOUNTER — Encounter: Payer: Self-pay | Admitting: Internal Medicine

## 2014-11-18 ENCOUNTER — Ambulatory Visit (HOSPITAL_COMMUNITY)
Admission: RE | Admit: 2014-11-18 | Discharge: 2014-11-18 | Disposition: A | Discharge status: Home / Self Care | Payer: No Typology Code available for payment source | Source: Ambulatory Visit | Attending: Internal Medicine | Admitting: Internal Medicine

## 2014-11-18 VITALS — BP 114/95 | HR 87 | Temp 97.8°F | Ht 70.0 in | Wt 166.4 lb

## 2014-11-18 DIAGNOSIS — IMO0001 Reserved for inherently not codable concepts without codable children: Secondary | ICD-10-CM

## 2014-11-18 DIAGNOSIS — M25571 Pain in right ankle and joints of right foot: Secondary | ICD-10-CM | POA: Diagnosis not present

## 2014-11-18 DIAGNOSIS — M8580 Other specified disorders of bone density and structure, unspecified site: Secondary | ICD-10-CM | POA: Insufficient documentation

## 2014-11-18 DIAGNOSIS — G8918 Other acute postprocedural pain: Secondary | ICD-10-CM

## 2014-11-18 DIAGNOSIS — R296 Repeated falls: Secondary | ICD-10-CM

## 2014-11-18 DIAGNOSIS — R030 Elevated blood-pressure reading, without diagnosis of hypertension: Secondary | ICD-10-CM | POA: Diagnosis not present

## 2014-11-18 DIAGNOSIS — M79671 Pain in right foot: Secondary | ICD-10-CM | POA: Diagnosis present

## 2014-11-18 MED ORDER — CAPSAICIN-MENTHOL-METHYL SAL 0.025-1-12 % EX CREA: 1.0000 "application " | TOPICAL_CREAM | Freq: Three times a day (TID) | CUTANEOUS | Status: DC

## 2014-11-18 MED ORDER — GABAPENTIN 300 MG PO CAPS: 300.0000 mg | ORAL_CAPSULE | Freq: Three times a day (TID) | ORAL | Status: DC

## 2014-11-18 MED ORDER — OXYCODONE HCL 5 MG PO TABS: 5.0000 mg | ORAL_TABLET | ORAL | Status: DC | PRN

## 2014-11-18 NOTE — Assessment & Plan Note (Addendum)
He has had ongoing right ankle pain since 03/2014. Initially began after approximately 1 month of walking 8 miles to and from work following his car breaking down. He has had an extensive work up of this ankle pain since that time. Treated for a presumed cellulitis with 10 day course of Bactrim from urgent care followed by a 10 day course of Keflex with no improvement. X-rays were negative. Indomethacin, prednisone and colchicine were ineffective. MRI showed multifocal marrow edema but no ligament or tendon tear. Has been seen by sports medicine and ortho, multiple steroid injections with no relief. Has tried meloxicam.   He is now being seen by Dr. Boyd Kerbs of [facility]. He reports he is now on B12 injections weekly, folic acid, vitamin D and gabapentin 300 mg TID. No relief with any of these treatments.  Today, his right ankle is tender to palpation on the lateral malleolus with no edema or erythema. No fever chills or other signs of infection. He complains of sharp, stabbing pain when he tries to bear weight on the foot. Is having falls 2/2 pain and is now using a walker to help get around. Symptoms are suggestive of more structural problem though no evidence on X-ray or MRI. No other joint pain.   Plan -Will increase his gabapentin dose from 300 TID to 300 bid and 600 qhs. If he is able to tolerate the drowsiness he can go up on the gabapentin to 600 TID.  - Capsaicin cream  - Get a repeat X-ray of the right ankle for any changes - Norco 5-235 mg q4hr prn #90 - Has another appointment with his neurologist at the beginning of September.  XRAY FINDINGS: The bones are mildly osteopenic diffusely. There is no acute or healing or old fracture. The joint spaces are reasonably well-maintained. There is a small plantar calcaneal spur. The soft tissues are unremarkable.  IMPRESSION: Diffuse osteopenia without acute bony abnormality. The osteopenia is is similar to that of  disuse. No periosteal reaction is observed.

## 2014-11-18 NOTE — Patient Instructions (Signed)
Thank you for coming in today. We will recheck X-rays of your right foot to check for any changes. We will also increase you Gabapentin to 1 tablet twice a day and 2 tablets at night and try this capsaicin cream. You can ask your neurologist about nerve conductions studies at your next appointment.

## 2014-11-18 NOTE — Assessment & Plan Note (Signed)
BP Readings from Last 3 Encounters:  11/18/14 114/95  09/28/14 133/84  09/14/14 111/74    Lab Results  Component Value Date   NA 134* 08/28/2014   K 3.6 08/28/2014   CREATININE 0.72 08/28/2014    Assessment: Blood pressure control:  at goal Progress toward BP goal:   at goal Comments: Patient reports he is adherent to his medications. Metoprolol 50 mg and lisinopril 5 mg. BP well controlled. Is complaining of 1-2 week cough. If cough persists, may need to stop the ACE-I.   Plan: Medications:  continue current medications Educational resources provided:   Self management tools provided:   Other plans: If cough persists, may need to stop the ACE-I.

## 2014-11-18 NOTE — Progress Notes (Signed)
Patient ID: Philip [name], male   DOB: 08-26-1960, 54 y.o.   MRN: [id number]   Subjective:   Patient ID: Philip [name] male   DOB: 27-Jan-1961 54 y.o.   MRN: [id number]  HPI: Mr.Philip [name] is a 54 y.o. man with a PMH of HTN, CAD s/p triple bypass and right ankle pain here today for follow up of his right ankle pain.   He has had ongoing right ankle pain since 03/2014. Initially began after approximately 1 month of walking 8 miles to and from work following his car breaking down. He has had an extensive work up of this ankle pain since that time. Treated for a presumed cellulitis with 10 day course of Bactrim from urgent care followed by a 10 day course of Keflex with no improvement. X-rays were negative. Indomethacin, prednisone and colchicine were ineffective. MRI showed multifocal marrow edema but no ligament or tendon tear. Has been seen by sports medicine and ortho, multiple steroid injections with no relief. Has tried meloxicam. Is now being seen by Dr. Boyd Kerbs of [facility]. He reports he is now on B12 injections weekly, folic acid, vit D and gabapentin 300 mg TID. No relief with any of these treatments.  Past Medical History  Diagnosis Date  . Foot pain   . ST elevation myocardial infarction (STEMI) of inferior wall 08/24/2014  . Coronary artery disease involving native coronary artery of native heart with unstable angina pectoris 08/24/2014   Current Outpatient Prescriptions  Medication Sig Dispense Refill  . aspirin EC 325 MG EC tablet Take 1 tablet (325 mg total) by mouth daily. 30 tablet 0  . atorvastatin (LIPITOR) 40 MG tablet Take 1 tablet (40 mg total) by mouth daily. 30 tablet 1  . Capsaicin-Menthol-Methyl Sal (CAPSAICIN-METHYL SAL-MENTHOL) 0.025-1-12 % CREA Apply 1 application topically 3 (three) times daily. 1 Tube 1  . diphenhydrAMINE (BENADRYL) 25 mg capsule Take 25 mg by mouth 2 (two) times daily as needed for allergies.     . fluticasone  (FLONASE) 50 MCG/ACT nasal spray Place 1 spray into both nostrils daily. 16 g 2  . gabapentin (NEURONTIN) 300 MG capsule Take 1 capsule (300 mg total) by mouth 3 (three) times daily. Take two tablets at night. 90 capsule 2  . hydrocortisone valerate ointment (WESTCORT) 0.2 % Apply 1 application topically 2 (two) times daily. 45 g 0  . lisinopril (PRINIVIL,ZESTRIL) 5 MG tablet Take 1 tablet (5 mg total) by mouth daily. 30 tablet 1  . meloxicam (MOBIC) 7.5 MG tablet Take 7.5 mg by mouth 2 (two) times daily as needed (inflammation).     . metoprolol tartrate (LOPRESSOR) 25 MG tablet Take 1 tablet (25 mg total) by mouth 2 (two) times daily. 60 tablet 1  . omeprazole (PRILOSEC OTC) 20 MG tablet Take 20 mg by mouth daily.    Marland Kitchen oxyCODONE (OXY IR/ROXICODONE) 5 MG immediate release tablet Take 1-2 tablets (5-10 mg total) by mouth every 4 (four) hours as needed for severe pain. 90 tablet 0   No current facility-administered medications for this visit.   No family history on file. History   Social History  . Marital Status: Married    Spouse Name: N/A  . Number of Children: N/A  . Years of Education: N/A   Social History Main Topics  . Smoking status: Current Every Day Smoker -- 0.20 packs/day    Types: Cigarettes  . Smokeless tobacco: Not on file     Comment: 4cigs day  down from 2ppd  . Alcohol Use: 0.0 oz/week    0 Standard drinks or equivalent per week     Comment: Sometimes on w/e.  . Drug Use: No  . Sexual Activity: Not on file   Other Topics Concern  . None   Social History Narrative   Review of Systems: Review of Systems  Constitutional: Negative for fever and chills.  Cardiovascular: Negative for leg swelling.  Musculoskeletal: Positive for joint pain and falls. Negative for back pain.  Skin: Negative for rash.  Neurological: Negative for focal weakness.   Objective:  Physical Exam: Filed Vitals:   11/18/14 0828  BP: 114/95  Pulse: 87  Temp: 97.8 F (36.6 C)  TempSrc:  Oral  Height: '5\' 10"'$  (1.778 m)  Weight: 166 lb 6.4 oz (75.479 kg)  SpO2: 100%   Physical Exam  Constitutional: He is oriented to person, place, and time. He appears well-developed and well-nourished. He appears distressed.  Pleasant male, appears to be in pain.   Cardiovascular: Normal rate, regular rhythm, normal heart sounds and intact distal pulses. Exam reveals no gallop and no friction rub.  No murmur heard. Pulmonary/Chest: Effort normal and breath sounds normal. No respiratory distress. He has no wheezes. He has no rales. He exhibits no tenderness.  Abdominal: Soft. Bowel sounds are normal. He exhibits no distension and no mass. There is no tenderness. There is no rebound and no guarding.  Musculoskeletal:  Pulses normal on both feet. Right ankle appears grossly normal. No erythema, mild warm on right foot. No open skin lesions. Has tenderness on the right ankle and right foot. Tenderness with plantar and dorsi flexion of right ankle. Pain is worse laterally around the lateral malleolus.  Neurological: He is alert and oriented to person, place, and time. No cranial nerve deficit. Coordination normal.  Skin: No rash noted. He is not diaphoretic. No erythema.   Assessment & Plan:  Case discussed with Dr. Daryll Drown. Please see problem based charting for assessment and plan.

## 2014-11-24 NOTE — Progress Notes (Signed)
Internal Medicine Clinic Attending  I saw and evaluated the patient.  I personally confirmed the key portions of the history and exam documented by Dr. Charlynn Grimes and I reviewed pertinent patient test results.  The assessment, diagnosis, and plan were formulated together and I agree with the documentation in the resident's note.  Philip [name] has had extensive work up for acute localized right lateral ankle pain.  I am not clear what is continuing to cause his pain, but he has adequate follow up with neurology who have diagnosed Charcot joint.  Continue to monitor. Trial of capsaicin cream today.

## 2015-03-29 ENCOUNTER — Ambulatory Visit (INDEPENDENT_AMBULATORY_CARE_PROVIDER_SITE_OTHER): Payer: No Typology Code available for payment source | Admitting: Cardiovascular Disease

## 2015-03-29 ENCOUNTER — Other Ambulatory Visit: Payer: Self-pay | Admitting: Cardiovascular Disease

## 2015-03-29 ENCOUNTER — Ambulatory Visit (HOSPITAL_COMMUNITY)
Admission: RE | Admit: 2015-03-29 | Discharge: 2015-03-29 | Disposition: A | Discharge status: Home / Self Care | Payer: No Typology Code available for payment source | Source: Ambulatory Visit | Attending: Cardiovascular Disease | Admitting: Cardiovascular Disease

## 2015-03-29 ENCOUNTER — Encounter: Payer: Self-pay | Admitting: Cardiovascular Disease

## 2015-03-29 VITALS — BP 118/78 | HR 74 | Ht 71.0 in | Wt 169.4 lb

## 2015-03-29 DIAGNOSIS — R938 Abnormal findings on diagnostic imaging of other specified body structures: Secondary | ICD-10-CM | POA: Diagnosis not present

## 2015-03-29 DIAGNOSIS — R202 Paresthesia of skin: Secondary | ICD-10-CM | POA: Diagnosis not present

## 2015-03-29 DIAGNOSIS — I739 Peripheral vascular disease, unspecified: Secondary | ICD-10-CM

## 2015-03-29 DIAGNOSIS — I2510 Atherosclerotic heart disease of native coronary artery without angina pectoris: Secondary | ICD-10-CM | POA: Diagnosis not present

## 2015-03-29 DIAGNOSIS — E7800 Pure hypercholesterolemia, unspecified: Secondary | ICD-10-CM | POA: Diagnosis not present

## 2015-03-29 DIAGNOSIS — I10 Essential (primary) hypertension: Secondary | ICD-10-CM

## 2015-03-29 DIAGNOSIS — Z720 Tobacco use: Secondary | ICD-10-CM

## 2015-03-29 DIAGNOSIS — I748 Embolism and thrombosis of other arteries: Secondary | ICD-10-CM | POA: Diagnosis not present

## 2015-03-29 DIAGNOSIS — I70203 Unspecified atherosclerosis of native arteries of extremities, bilateral legs: Secondary | ICD-10-CM | POA: Insufficient documentation

## 2015-03-29 LAB — CBC
HEMATOCRIT: 35.8 % — AB (ref 39.0–52.0)
HEMOGLOBIN: 11.5 g/dL — AB (ref 13.0–17.0)
MCH: 26.3 pg (ref 26.0–34.0)
MCHC: 32.1 g/dL (ref 30.0–36.0)
MCV: 81.7 fL (ref 78.0–100.0)
MPV: 9.8 fL (ref 8.6–12.4)
Platelets: 321 10*3/uL (ref 150–400)
RBC: 4.38 MIL/uL (ref 4.22–5.81)
RDW: 17.6 % — ABNORMAL HIGH (ref 11.5–15.5)
WBC: 6.3 10*3/uL (ref 4.0–10.5)

## 2015-03-29 LAB — BASIC METABOLIC PANEL
BUN: 5 mg/dL — ABNORMAL LOW (ref 7–25)
CHLORIDE: 105 mmol/L (ref 98–110)
CO2: 25 mmol/L (ref 20–31)
Calcium: 8.8 mg/dL (ref 8.6–10.3)
Creat: 0.66 mg/dL — ABNORMAL LOW (ref 0.70–1.33)
GLUCOSE: 73 mg/dL (ref 65–99)
POTASSIUM: 4.6 mmol/L (ref 3.5–5.3)
SODIUM: 139 mmol/L (ref 135–146)

## 2015-03-29 MED ORDER — PREDNISONE 20 MG PO TABS: ORAL_TABLET | ORAL | Status: DC

## 2015-03-29 NOTE — Assessment & Plan Note (Signed)
He seems to be doing reasonably well since his CABG.

## 2015-03-29 NOTE — Assessment & Plan Note (Signed)
Continue treatment with atorvastatin with a target LDL of less than 70. 

## 2015-03-29 NOTE — Assessment & Plan Note (Signed)
The patient has critical limb ischemia involving the left lower extremity manifested by rest pain without ulceration (Rutherford class 4). The onset was about one month ago. By physical exam, he has normal femoral pulses but absent distal pulses on the left side only. This requires urgent evaluation and intervention. Thus, I recommend proceeding with abdominal aortogram with left lower extremity angiography and possible endovascular intervention. The procedure is scheduled for tomorrow. I am going to obtain lower extremity arterial Doppler today. I discussed the risk and benefits with the patient.

## 2015-03-29 NOTE — Patient Instructions (Addendum)
Medication Instructions:  Your physician recommends that you continue on your current medications as directed. Please refer to the Current Medication list given to you today.  Labwork: Your physician recommends that you have lab work today: BMP, CBC and PT/INR  Testing/Procedures: Your physician has requested that you have a lower extremity arterial doppler TODAY ([facility] location at [facility]).  There are no restrictions or special instructions  Your physician has requested that you have a peripheral vascular angiogram. This exam is performed at the hospital. During this exam IV contrast is used to look at arterial blood flow. Please review the information sheet given for details.  Follow-Up: We will arrange further follow-up after your angiogram.   Any Other Special Instructions Will Be Listed Below (If Applicable).     If you need a refill on your cardiac medications before your next appointment, please call your pharmacy.

## 2015-03-29 NOTE — Assessment & Plan Note (Signed)
Blood pressure is well controlled on current medications. 

## 2015-03-29 NOTE — Progress Notes (Signed)
HPI  This is a pleasant 54 year old male who is here today to establish cardiovascular care and for urgent evaluation of left leg discomfort. The patient has known history of coronary artery disease. He presented with inferior STEMI 08/24/2014. Cardiac catheterization revealed a complex 90% stenosis proximal LAD, 95% stenosis mid LAD, 70% stenosis D1, and 75% stenosis mid RCA. The patient was transferred to [facility] where he underwent CABG x3, with LIMA to LAD, SVG 2D 1, and RIMA to RCA. He has other medical problems that include tobacco use, hypertension and hyperlipidemia. The patient is transferring from Dr. Saralyn Pilar.  Over the last month, he has experienced severe left foot and calf pain described as cramping and burning sensation. This has affected his ability to walk and currently is happening with minimal distance. He is also having severe pain at night that wakes him up from sleep. He has no lower extremity ulceration. He used to smoke 2-1/2 packs per day but he is down to a half a pack per day. He is not diabetic and he has been taking his medications regularly. He has occasional chest discomfort but not exertional. He has chronic dyspnea with no recent worsening.   Allergies  Allergen Reactions  . Other Swelling    Other reaction(s): Other (See Comments) Pt states that he got "knots behind his ears".  . Contrast Media [Iodinated Diagnostic Agents] Other (See Comments)    Pt states that he got "knots behind his ears".  . Nsaids Other (See Comments)    Other reaction(s): Other (See Comments) Reaction:  Blood in stool  Reaction:  Blood in stool   . Ibuprofen Other (See Comments) and Nausea Only    Blood in stools     Current Outpatient Prescriptions on File Prior to Visit  Medication Sig Dispense Refill  . aspirin EC 325 MG EC tablet Take 1 tablet (325 mg total) by mouth daily. 30 tablet 0  . atorvastatin (LIPITOR) 40 MG tablet Take 1 tablet (40 mg total) by mouth  daily. 30 tablet 1  . diphenhydrAMINE (BENADRYL) 25 mg capsule Take 25 mg by mouth 2 (two) times daily as needed for allergies.     . fluticasone (FLONASE) 50 MCG/ACT nasal spray Place 1 spray into both nostrils daily. 16 g 2  . gabapentin (NEURONTIN) 300 MG capsule Take 1 capsule (300 mg total) by mouth 3 (three) times daily. Take two tablets at night. 90 capsule 2  . lisinopril (PRINIVIL,ZESTRIL) 5 MG tablet Take 1 tablet (5 mg total) by mouth daily. 30 tablet 1  . metoprolol tartrate (LOPRESSOR) 25 MG tablet Take 1 tablet (25 mg total) by mouth 2 (two) times daily. 60 tablet 1  . omeprazole (PRILOSEC OTC) 20 MG tablet Take 20 mg by mouth daily.    Marland Kitchen oxyCODONE (OXY IR/ROXICODONE) 5 MG immediate release tablet Take 1-2 tablets (5-10 mg total) by mouth every 4 (four) hours as needed for severe pain. 90 tablet 0   No current facility-administered medications on file prior to visit.     Past Medical History  Diagnosis Date  . Foot pain   . ST elevation myocardial infarction (STEMI) of inferior wall ([place]) 08/24/2014  . Coronary artery disease involving native coronary artery of native heart with unstable angina pectoris ([place]) 08/24/2014    inferior STEMI 08/24/2014. Cardiac catheterization revealed a complex 90% stenosis proximal LAD, 95% stenosis mid LAD, 70% stenosis D1, and 75% stenosis mid RCA. The patient was transferred to [facility] where  he underwent CABG x3, with LIMA to LAD, SVG 2D 1, and RIMA to RCA.     Past Surgical History  Procedure Laterality Date  . Coronary artery bypass graft N/A 08/25/2014    Procedure: CORONARY ARTERY BYPASS GRAFTING (CABG), ON PUMP, TIMES THREE, USING BILATERAL MAMMARY ARTERIES, RIGHT GREATER SAPHENOUS VEIN HARVESTED ENDOSCOPICALLY;  Surgeon: Melrose Nakayama, MD;  Location: [place];  Service: Open Heart Surgery;  Laterality: N/A;  . Tee without cardioversion N/A 08/25/2014    Procedure: TRANSESOPHAGEAL ECHOCARDIOGRAM (TEE);  Surgeon: Melrose Nakayama, MD;  Location: [place];  Service: Open Heart Surgery;  Laterality: N/A;  . Cardiac catheterization N/A 08/24/2014    Procedure: Left Heart Cath and Coronary Angiography;  Surgeon: Isaias Cowman, MD;  Location: [place] [facility];  Service: Cardiovascular;  Laterality: N/A;  . Cardiac catheterization N/A 08/24/2014    Procedure: Coronary Stent Intervention;  Surgeon: Isaias Cowman, MD;  Location: [place] [facility];  Service: Cardiovascular;  Laterality: N/A;     Family History  Problem Relation Age of Onset  . Hypertension Father   . Heart attack Mother 76    STENT  . Hyperlipidemia Mother   . Heart attack Brother   . Heart attack Brother   . Breast cancer Sister   . Heart attack Sister      Social History   Social History  . Marital Status: Married    Spouse Name: N/A  . Number of Children: N/A  . Years of Education: N/A   Occupational History  . Not on file.   Social History Main Topics  . Smoking status: Current Every Day Smoker -- 0.20 packs/day    Types: Cigarettes  . Smokeless tobacco: Not on file     Comment: 4cigs day down from 2ppd  . Alcohol Use: 0.0 oz/week    0 Standard drinks or equivalent per week     Comment: Sometimes on w/e.  . Drug Use: No  . Sexual Activity: Not on file   Other Topics Concern  . Not on file   Social History Narrative     ROS A 10 point review of system was performed. It is negative other than that mentioned in the history of present illness.   PHYSICAL EXAM   BP 118/78 mmHg  Pulse 74  Ht '5\' 11"'$  (1.803 m)  Wt 169 lb 6.4 oz (76.839 kg)  BMI 23.64 kg/m2 Constitutional: He is oriented to person, place, and time. He appears well-developed and well-nourished. No distress.  HENT: No nasal discharge.  Head: Normocephalic and atraumatic.  Eyes: Pupils are equal and round.  No discharge. Neck: Normal range of motion. Neck supple. No JVD present. No thyromegaly present.  Cardiovascular: Normal rate,  regular rhythm, normal heart sounds. Exam reveals no gallop and no friction rub. No murmur heard.  Pulmonary/Chest: Effort normal and breath sounds normal. No stridor. No respiratory distress. He has no wheezes. He has no rales. He exhibits no tenderness.  Abdominal: Soft. Bowel sounds are normal. He exhibits no distension. There is no tenderness. There is no rebound and no guarding.  Musculoskeletal: Normal range of motion. He exhibits no edema and no tenderness.  Neurological: He is alert and oriented to person, place, and time. Coordination normal.  Skin: Skin is warm and dry. No rash noted. He is not diaphoretic. No erythema. No pallor.  Psychiatric: He has a normal mood and affect. His behavior is normal. Judgment and thought content normal.  Vascular: Femoral pulses are  normal bilaterally. Posterior tibial and dorsalis pedis: +2 on the right side and absent on the left side.      ASSESSMENT AND PLAN

## 2015-03-29 NOTE — Assessment & Plan Note (Signed)
The patient knows that he needs to quit but he hasn't been able to do so although he cut down to half a pack per day.

## 2015-03-30 ENCOUNTER — Ambulatory Visit (HOSPITAL_COMMUNITY)
Admission: RE | Admit: 2015-03-30 | Discharge: 2015-03-30 | Disposition: A | Discharge status: Home / Self Care | Payer: No Typology Code available for payment source | Source: Ambulatory Visit | Attending: Cardiovascular Disease | Admitting: Cardiovascular Disease

## 2015-03-30 ENCOUNTER — Telehealth: Payer: Self-pay | Admitting: *Deleted

## 2015-03-30 ENCOUNTER — Other Ambulatory Visit: Payer: Self-pay | Admitting: Physician Assistant

## 2015-03-30 ENCOUNTER — Other Ambulatory Visit: Payer: Self-pay | Admitting: Cardiovascular Disease

## 2015-03-30 ENCOUNTER — Encounter (HOSPITAL_COMMUNITY)
Admission: RE | Disposition: A | Discharge status: Home / Self Care | Payer: Self-pay | Source: Ambulatory Visit | Attending: Cardiovascular Disease

## 2015-03-30 DIAGNOSIS — I10 Essential (primary) hypertension: Secondary | ICD-10-CM | POA: Insufficient documentation

## 2015-03-30 DIAGNOSIS — E785 Hyperlipidemia, unspecified: Secondary | ICD-10-CM | POA: Insufficient documentation

## 2015-03-30 DIAGNOSIS — F17210 Nicotine dependence, cigarettes, uncomplicated: Secondary | ICD-10-CM | POA: Insufficient documentation

## 2015-03-30 DIAGNOSIS — I25110 Atherosclerotic heart disease of native coronary artery with unstable angina pectoris: Secondary | ICD-10-CM | POA: Diagnosis not present

## 2015-03-30 DIAGNOSIS — I252 Old myocardial infarction: Secondary | ICD-10-CM | POA: Diagnosis not present

## 2015-03-30 DIAGNOSIS — Z91041 Radiographic dye allergy status: Secondary | ICD-10-CM | POA: Diagnosis not present

## 2015-03-30 DIAGNOSIS — Z7982 Long term (current) use of aspirin: Secondary | ICD-10-CM | POA: Diagnosis not present

## 2015-03-30 DIAGNOSIS — I739 Peripheral vascular disease, unspecified: Secondary | ICD-10-CM

## 2015-03-30 DIAGNOSIS — Z951 Presence of aortocoronary bypass graft: Secondary | ICD-10-CM | POA: Diagnosis not present

## 2015-03-30 DIAGNOSIS — Z8249 Family history of ischemic heart disease and other diseases of the circulatory system: Secondary | ICD-10-CM | POA: Diagnosis not present

## 2015-03-30 DIAGNOSIS — I70222 Atherosclerosis of native arteries of extremities with rest pain, left leg: Secondary | ICD-10-CM | POA: Diagnosis not present

## 2015-03-30 HISTORY — PX: PERIPHERAL VASCULAR CATHETERIZATION: SHX172C

## 2015-03-30 LAB — POCT ACTIVATED CLOTTING TIME
Activated Clotting Time: 198 seconds
Activated Clotting Time: 229 seconds

## 2015-03-30 LAB — PROTIME-INR
INR: 0.92 (ref <1.50)
Prothrombin Time: 12.5 seconds (ref 11.6–15.2)

## 2015-03-30 SURGERY — ABDOMINAL AORTOGRAM W/LOWER EXTREMITY
Anesthesia: LOCAL

## 2015-03-30 MED ORDER — CLOPIDOGREL BISULFATE 300 MG PO TABS
ORAL_TABLET | ORAL | Status: AC
  Filled 2015-03-30: qty 1

## 2015-03-30 MED ORDER — MIDAZOLAM HCL 2 MG/2ML IJ SOLN
INTRAMUSCULAR | Status: AC
  Filled 2015-03-30: qty 2

## 2015-03-30 MED ORDER — SODIUM CHLORIDE 0.9 % IV SOLN
INTRAVENOUS | Status: AC
  Administered 2015-03-30: 13:00:00 via INTRAVENOUS

## 2015-03-30 MED ORDER — SODIUM CHLORIDE 0.9 % IJ SOLN: 3.0000 mL | INTRAMUSCULAR | Status: DC | PRN

## 2015-03-30 MED ORDER — SODIUM CHLORIDE 0.9 % IV SOLN: 250.0000 mL | INTRAVENOUS | Status: DC | PRN

## 2015-03-30 MED ORDER — HEPARIN (PORCINE) IN NACL 2-0.9 UNIT/ML-% IJ SOLN
INTRAMUSCULAR | Status: AC
Start: 2015-03-30 — End: 2015-03-30
  Filled 2015-03-30: qty 1000

## 2015-03-30 MED ORDER — ASPIRIN 81 MG PO CHEW: 81.0000 mg | CHEWABLE_TABLET | ORAL | Status: DC

## 2015-03-30 MED ORDER — HEPARIN SODIUM (PORCINE) 1000 UNIT/ML IJ SOLN
INTRAMUSCULAR | Status: DC | PRN
  Administered 2015-03-30 (×2): 6000 [IU] via INTRAVENOUS
  Administered 2015-03-30: 3000 [IU] via INTRAVENOUS

## 2015-03-30 MED ORDER — LIDOCAINE HCL (PF) 1 % IJ SOLN
INTRAMUSCULAR | Status: DC | PRN
  Administered 2015-03-30: 12:00:00

## 2015-03-30 MED ORDER — CLOPIDOGREL BISULFATE 300 MG PO TABS
ORAL_TABLET | ORAL | Status: DC | PRN
  Administered 2015-03-30: 600 mg via ORAL

## 2015-03-30 MED ORDER — SODIUM CHLORIDE 0.9 % WEIGHT BASED INFUSION: 1.0000 mL/kg/h | INTRAVENOUS | Status: DC

## 2015-03-30 MED ORDER — DIPHENHYDRAMINE HCL 50 MG/ML IJ SOLN
INTRAMUSCULAR | Status: AC
  Administered 2015-03-30: 25 mg via INTRAVENOUS
  Filled 2015-03-30: qty 1

## 2015-03-30 MED ORDER — CLOPIDOGREL BISULFATE 75 MG PO TABS: 75.0000 mg | ORAL_TABLET | Freq: Every day | ORAL | Status: DC

## 2015-03-30 MED ORDER — SODIUM CHLORIDE 0.9 % IJ SOLN: 3.0000 mL | Freq: Two times a day (BID) | INTRAMUSCULAR | Status: DC

## 2015-03-30 MED ORDER — IODIXANOL 320 MG/ML IV SOLN
INTRAVENOUS | Status: DC | PRN
  Administered 2015-03-30: 120 mL via INTRA_ARTERIAL

## 2015-03-30 MED ORDER — LIDOCAINE HCL (PF) 1 % IJ SOLN
INTRAMUSCULAR | Status: AC
  Filled 2015-03-30: qty 30

## 2015-03-30 MED ORDER — FENTANYL CITRATE (PF) 100 MCG/2ML IJ SOLN
INTRAMUSCULAR | Status: AC
  Filled 2015-03-30: qty 2

## 2015-03-30 MED ORDER — MIDAZOLAM HCL 2 MG/2ML IJ SOLN
INTRAMUSCULAR | Status: DC | PRN
  Administered 2015-03-30: 1 mg via INTRAVENOUS
  Administered 2015-03-30: 2 mg via INTRAVENOUS
  Administered 2015-03-30: 1 mg via INTRAVENOUS

## 2015-03-30 MED ORDER — SODIUM CHLORIDE 0.9 % WEIGHT BASED INFUSION
3.0000 mL/kg/h | INTRAVENOUS | Status: AC
  Administered 2015-03-30: 3 mL/kg/h via INTRAVENOUS

## 2015-03-30 MED ORDER — HEPARIN SODIUM (PORCINE) 1000 UNIT/ML IJ SOLN
INTRAMUSCULAR | Status: AC
  Filled 2015-03-30: qty 1

## 2015-03-30 MED ORDER — DIPHENHYDRAMINE HCL 50 MG/ML IJ SOLN
25.0000 mg | INTRAMUSCULAR | Status: AC
  Administered 2015-03-30: 25 mg via INTRAVENOUS

## 2015-03-30 MED ORDER — FENTANYL CITRATE (PF) 100 MCG/2ML IJ SOLN
INTRAMUSCULAR | Status: DC | PRN
  Administered 2015-03-30 (×3): 50 ug via INTRAVENOUS

## 2015-03-30 SURGICAL SUPPLY — 20 items
BALLN ARMADA 5X60X135 (BALLOONS) ×2 IMPLANT
CATH ANGIO 5F PIGTAIL 65CM (CATHETERS) ×2 IMPLANT
CATH CROSS OVER TEMPO 5F (CATHETERS) ×2 IMPLANT
CATH NAVICROSS ST .035X135CM (MICROCATHETER) ×2 IMPLANT
CATH STRAIGHT 5FR 65CM (CATHETERS) ×2 IMPLANT
DEVICE CLOSURE MYNXGRIP 6/7F (Vascular Products) ×2 IMPLANT
KIT ENCORE 26 ADVANTAGE (KITS) ×4 IMPLANT
KIT PV (KITS) ×2 IMPLANT
SHEATH PINNACLE 5F 10CM (SHEATH) ×2 IMPLANT
SHEATH PINNACLE 7F 10CM (SHEATH) ×2 IMPLANT
SHEATH PINNACLE ST 7F 45CM (SHEATH) ×2 IMPLANT
STENT INNOVA 5X60X130 (Permanent Stent) ×2 IMPLANT
STOPCOCK MORSE 400PSI 3WAY (MISCELLANEOUS) ×2 IMPLANT
SYRINGE MEDRAD AVANTA MACH 7 (SYRINGE) ×2 IMPLANT
TAPE RADIOPAQUE TURBO (MISCELLANEOUS) ×2 IMPLANT
TRANSDUCER W/STOPCOCK (MISCELLANEOUS) ×2 IMPLANT
TRAY PV CATH (CUSTOM PROCEDURE TRAY) ×2 IMPLANT
TUBING CIL FLEX 10 FLL-RA (TUBING) ×2 IMPLANT
WIRE HITORQ VERSACORE ST 145CM (WIRE) ×2 IMPLANT
WIRE VERSACORE LOC 115CM (WIRE) ×2 IMPLANT

## 2015-03-30 NOTE — Interval H&P Note (Signed)
History and Physical Interval Note:  03/30/2015 11:12 AM  Philip [name]  has presented today for surgery, with the diagnosis of pvd  The various methods of treatment have been discussed with the patient and family. After consideration of risks, benefits and other options for treatment, the patient has consented to  Procedure(s): Abdominal Aortogram w/Lower Extremity (N/A) as a surgical intervention .  The patient's history has been reviewed, patient examined, no change in status, stable for surgery.  I have reviewed the patient's chart and labs.  Questions were answered to the patient's satisfaction.     Kathlyn [place]

## 2015-03-30 NOTE — H&P (View-Only) (Signed)
HPI  This is a pleasant 54 year old male who is here today to establish cardiovascular care and for urgent evaluation of left leg discomfort. The patient has known history of coronary artery disease. He presented with inferior STEMI 08/24/2014. Cardiac catheterization revealed a complex 90% stenosis proximal LAD, 95% stenosis mid LAD, 70% stenosis D1, and 75% stenosis mid RCA. The patient was transferred to [facility] where he underwent CABG x3, with LIMA to LAD, SVG 2D 1, and RIMA to RCA. He has other medical problems that include tobacco use, hypertension and hyperlipidemia. The patient is transferring from Dr. Saralyn Pilar.  Over the last month, he has experienced severe left foot and calf pain described as cramping and burning sensation. This has affected his ability to walk and currently is happening with minimal distance. He is also having severe pain at night that wakes him up from sleep. He has no lower extremity ulceration. He used to smoke 2-1/2 packs per day but he is down to a half a pack per day. He is not diabetic and he has been taking his medications regularly. He has occasional chest discomfort but not exertional. He has chronic dyspnea with no recent worsening.   Allergies  Allergen Reactions  . Other Swelling    Other reaction(s): Other (See Comments) Pt states that he got "knots behind his ears".  . Contrast Media [Iodinated Diagnostic Agents] Other (See Comments)    Pt states that he got "knots behind his ears".  . Nsaids Other (See Comments)    Other reaction(s): Other (See Comments) Reaction:  Blood in stool  Reaction:  Blood in stool   . Ibuprofen Other (See Comments) and Nausea Only    Blood in stools     Current Outpatient Prescriptions on File Prior to Visit  Medication Sig Dispense Refill  . aspirin EC 325 MG EC tablet Take 1 tablet (325 mg total) by mouth daily. 30 tablet 0  . atorvastatin (LIPITOR) 40 MG tablet Take 1 tablet (40 mg total) by mouth  daily. 30 tablet 1  . diphenhydrAMINE (BENADRYL) 25 mg capsule Take 25 mg by mouth 2 (two) times daily as needed for allergies.     . fluticasone (FLONASE) 50 MCG/ACT nasal spray Place 1 spray into both nostrils daily. 16 g 2  . gabapentin (NEURONTIN) 300 MG capsule Take 1 capsule (300 mg total) by mouth 3 (three) times daily. Take two tablets at night. 90 capsule 2  . lisinopril (PRINIVIL,ZESTRIL) 5 MG tablet Take 1 tablet (5 mg total) by mouth daily. 30 tablet 1  . metoprolol tartrate (LOPRESSOR) 25 MG tablet Take 1 tablet (25 mg total) by mouth 2 (two) times daily. 60 tablet 1  . omeprazole (PRILOSEC OTC) 20 MG tablet Take 20 mg by mouth daily.    Marland Kitchen oxyCODONE (OXY IR/ROXICODONE) 5 MG immediate release tablet Take 1-2 tablets (5-10 mg total) by mouth every 4 (four) hours as needed for severe pain. 90 tablet 0   No current facility-administered medications on file prior to visit.     Past Medical History  Diagnosis Date  . Foot pain   . ST elevation myocardial infarction (STEMI) of inferior wall ([place]) 08/24/2014  . Coronary artery disease involving native coronary artery of native heart with unstable angina pectoris ([place]) 08/24/2014    inferior STEMI 08/24/2014. Cardiac catheterization revealed a complex 90% stenosis proximal LAD, 95% stenosis mid LAD, 70% stenosis D1, and 75% stenosis mid RCA. The patient was transferred to [facility] where  he underwent CABG x3, with LIMA to LAD, SVG 2D 1, and RIMA to RCA.     Past Surgical History  Procedure Laterality Date  . Coronary artery bypass graft N/A 08/25/2014    Procedure: CORONARY ARTERY BYPASS GRAFTING (CABG), ON PUMP, TIMES THREE, USING BILATERAL MAMMARY ARTERIES, RIGHT GREATER SAPHENOUS VEIN HARVESTED ENDOSCOPICALLY;  Surgeon: Melrose Nakayama, MD;  Location: [place];  Service: Open Heart Surgery;  Laterality: N/A;  . Tee without cardioversion N/A 08/25/2014    Procedure: TRANSESOPHAGEAL ECHOCARDIOGRAM (TEE);  Surgeon: Melrose Nakayama, MD;  Location: [place];  Service: Open Heart Surgery;  Laterality: N/A;  . Cardiac catheterization N/A 08/24/2014    Procedure: Left Heart Cath and Coronary Angiography;  Surgeon: Isaias Cowman, MD;  Location: [place] [facility];  Service: Cardiovascular;  Laterality: N/A;  . Cardiac catheterization N/A 08/24/2014    Procedure: Coronary Stent Intervention;  Surgeon: Isaias Cowman, MD;  Location: [place] [facility];  Service: Cardiovascular;  Laterality: N/A;     Family History  Problem Relation Age of Onset  . Hypertension Father   . Heart attack Mother 30    STENT  . Hyperlipidemia Mother   . Heart attack Brother   . Heart attack Brother   . Breast cancer Sister   . Heart attack Sister      Social History   Social History  . Marital Status: Married    Spouse Name: N/A  . Number of Children: N/A  . Years of Education: N/A   Occupational History  . Not on file.   Social History Main Topics  . Smoking status: Current Every Day Smoker -- 0.20 packs/day    Types: Cigarettes  . Smokeless tobacco: Not on file     Comment: 4cigs day down from 2ppd  . Alcohol Use: 0.0 oz/week    0 Standard drinks or equivalent per week     Comment: Sometimes on w/e.  . Drug Use: No  . Sexual Activity: Not on file   Other Topics Concern  . Not on file   Social History Narrative     ROS A 10 point review of system was performed. It is negative other than that mentioned in the history of present illness.   PHYSICAL EXAM   BP 118/78 mmHg  Pulse 74  Ht '5\' 11"'$  (1.803 m)  Wt 169 lb 6.4 oz (76.839 kg)  BMI 23.64 kg/m2 Constitutional: He is oriented to person, place, and time. He appears well-developed and well-nourished. No distress.  HENT: No nasal discharge.  Head: Normocephalic and atraumatic.  Eyes: Pupils are equal and round.  No discharge. Neck: Normal range of motion. Neck supple. No JVD present. No thyromegaly present.  Cardiovascular: Normal rate,  regular rhythm, normal heart sounds. Exam reveals no gallop and no friction rub. No murmur heard.  Pulmonary/Chest: Effort normal and breath sounds normal. No stridor. No respiratory distress. He has no wheezes. He has no rales. He exhibits no tenderness.  Abdominal: Soft. Bowel sounds are normal. He exhibits no distension. There is no tenderness. There is no rebound and no guarding.  Musculoskeletal: Normal range of motion. He exhibits no edema and no tenderness.  Neurological: He is alert and oriented to person, place, and time. Coordination normal.  Skin: Skin is warm and dry. No rash noted. He is not diaphoretic. No erythema. No pallor.  Psychiatric: He has a normal mood and affect. His behavior is normal. Judgment and thought content normal.  Vascular: Femoral pulses are  normal bilaterally. Posterior tibial and dorsalis pedis: +2 on the right side and absent on the left side.      ASSESSMENT AND PLAN

## 2015-03-30 NOTE — Progress Notes (Signed)
Pt has a contrast allergy and has completed an already prescribed regime from Dr Fletcher Anon. Dr Fletcher Anon was called and the patient is Not to take the hospital ordered methylprednisolone but will have the dose of benadryl.

## 2015-03-30 NOTE — Telephone Encounter (Signed)
lmvo to schedule lower ext dopplers and then follow up with Arida in 2wks. Post pv procedure 12/14

## 2015-03-30 NOTE — Discharge Instructions (Signed)
Start taking Plavix 75 mg once daily. A prescription was sent to [facility] in [place].    Excuse from Work, [employer], or Physical Activity ___________Wanda Mize_________ needs to be excused from: _____ Work _____ [employer] _____ Physical activity beginning now and through the following date: _____12/14/16_______________. _____ He or she may return to work or school but should still avoid the following physical activity or activities from now until ________none____________. Activity restrictions include: none    This information is not intended to replace advice given to you by your health care provider. Make sure you discuss any questions you have with your health care provider.   Document Released: 09/26/2000 Document Revised: 04/23/2014 Document Reviewed: 11/02/2013 Elsevier Interactive Patient Education 2016 [place] After Refer to this sheet in the next few weeks. These instructions provide you with information about caring for yourself after your procedure. Your health care provider may also give you more specific instructions. Your treatment has been planned according to current medical practices, but problems sometimes occur. Call your health care provider if you have any problems or questions after your procedure. WHAT TO EXPECT AFTER THE PROCEDURE After your procedure, it is typical to have the following:  Bruising at the catheter insertion site that usually fades within 1-2 weeks.  Blood collecting in the tissue (hematoma) that may be painful to the touch. It should usually decrease in size and tenderness within 1-2 weeks. HOME CARE INSTRUCTIONS  Take medicines only as directed by your health care provider.  You may shower 24-48 hours after the procedure or as directed by your health care provider. Remove the bandage (dressing) and gently wash the site with plain soap and water. Pat the area dry with a clean towel. Do not rub the site, because this  may cause bleeding.  Do not take baths, swim, or use a hot tub until your health care provider approves.  Check your insertion site every day for redness, swelling, or drainage.  Do not apply powder or lotion to the site.  Do not lift over 10 lb (4.5 kg) for 5 days after your procedure or as directed by your health care provider.  Ask your health care provider when it is okay to:  Return to work or school.  Resume usual physical activities or sports.  Resume sexual activity.  Do not drive home if you are discharged the same day as the procedure. Have someone else drive you.  You may drive 24 hours after the procedure unless otherwise instructed by your health care provider.  Do not operate machinery or power tools for 24 hours after the procedure or as directed by your health care provider.  If your procedure was done as an outpatient procedure, which means that you went home the same day as your procedure, a responsible adult should be with you for the first 24 hours after you arrive home.  Keep all follow-up visits as directed by your health care provider. This is important. SEEK MEDICAL CARE IF:  You have a fever.  You have chills.  You have increased bleeding from the catheter insertion site. Hold pressure on the site. SEEK IMMEDIATE MEDICAL CARE IF:  You have unusual pain at the catheter insertion site.  You have redness, warmth, or swelling at the catheter insertion site.  You have drainage (other than a small amount of blood on the dressing) from the catheter insertion site.  The catheter insertion site is bleeding, and the bleeding does not  stop after 30 minutes of holding steady pressure on the site.  The area near or just beyond the catheter insertion site becomes pale, cool, tingly, or numb.   This information is not intended to replace advice given to you by your health care provider. Make sure you discuss any questions you have with your health care  provider.   Document Released: 10/19/2004 Document Revised: 04/23/2014 Document Reviewed: 09/03/2012 Elsevier Interactive Patient Education [employer].

## 2015-03-31 ENCOUNTER — Telehealth: Payer: Self-pay | Admitting: Cardiovascular Disease

## 2015-03-31 ENCOUNTER — Encounter (HOSPITAL_COMMUNITY): Payer: Self-pay | Admitting: Cardiovascular Disease

## 2015-03-31 NOTE — Telephone Encounter (Signed)
Pt c/o medication issu e:  1. Name of Medication: ASA  2. How are you currently taking this medication (dosage and times per day)?  325 mg po daily   3. Are you having a reaction (difficulty breathing--STAT)?  No   4. What is your medication issue? Gave new rx s/p procedure for plavix and wants to know if he should dc asa or continue taking in combo. With New Plavix  Please call patient

## 2015-04-01 NOTE — Telephone Encounter (Signed)
Returning call.   tx to sharon

## 2015-04-01 NOTE — Telephone Encounter (Signed)
Left message on machine for patient to contact the office.   

## 2015-04-01 NOTE — Telephone Encounter (Signed)
S/w pt who is inquiring if he can continue '325mg'$  aspirin along w/plavix. Plavix '75mg'$  qd prescription submitted by MD Fletcher Anon 03/30/15 Informed pt that aspirin was on med list so MD aware. Advised pt to continue all medications as recommended.  Pt verbalized understanding.

## 2015-04-05 ENCOUNTER — Other Ambulatory Visit: Payer: Self-pay | Admitting: Cardiovascular Disease

## 2015-04-05 DIAGNOSIS — I779 Disorder of arteries and arterioles, unspecified: Secondary | ICD-10-CM

## 2015-04-05 DIAGNOSIS — Z959 Presence of cardiac and vascular implant and graft, unspecified: Secondary | ICD-10-CM

## 2015-04-08 ENCOUNTER — Other Ambulatory Visit: Payer: Self-pay | Admitting: Cardiovascular Disease

## 2015-04-08 ENCOUNTER — Ambulatory Visit: Payer: No Typology Code available for payment source

## 2015-04-08 DIAGNOSIS — I739 Peripheral vascular disease, unspecified: Secondary | ICD-10-CM | POA: Diagnosis not present

## 2015-04-08 DIAGNOSIS — Z959 Presence of cardiac and vascular implant and graft, unspecified: Secondary | ICD-10-CM | POA: Diagnosis not present

## 2015-04-08 DIAGNOSIS — I779 Disorder of arteries and arterioles, unspecified: Secondary | ICD-10-CM

## 2015-04-12 ENCOUNTER — Telehealth: Payer: Self-pay | Admitting: *Deleted

## 2015-04-12 NOTE — Telephone Encounter (Signed)
Pt calling stating his left foot it hurting, "hurting is unreal"  States where the blood clot broke of it feels like its pinching him Would like to know what can he do for the pain Please advise

## 2015-04-12 NOTE — Telephone Encounter (Signed)
Patient calling to check on Dr. Tyrell Antonio answer.   Patient is aware that Dr. Fletcher Anon has not had a chance to respond yet and Ivin Booty will call him when further instructions are received.

## 2015-04-12 NOTE — Telephone Encounter (Signed)
S/w pt who reports left foot pain burning, needle-like feeling for a few weeks. Reports swelling behind toes. He has tried an ice pack but reports this makes it more painful and does not help swelling. Denies discoloration and reports pulse and feeling in the foot.   Pt had Dec 13 LE arterial dopper and abdominal aortogram w/LE angiogram on Dec 14 States the pain has been worse since the 12/14 procedure, constant and is increasingly worse each day. He has been taking Plavix as prescribed w/no missed doses. He would like to know what can be done for the continued pain. Forward to MD to advise.

## 2015-04-12 NOTE — Telephone Encounter (Signed)
He needs to be evaluated in the office by  Thurmond Butts or Gerald Stabs.  He can take Tylenol as needed. I also see that he is on oxycodone prescribed by his primary care provider and that should be fine from my standpoint.

## 2015-04-13 NOTE — Telephone Encounter (Signed)
S/w pt who is agreeable w/appt 12/29 at 10:30am with Ignacia Bayley, NP Reviewed s/s that would require immediate attention. Pt verbalized understanding

## 2015-04-13 NOTE — Telephone Encounter (Signed)
S/w pt of Dr. Tyrell Antonio recommendations. Informed pt that we are trying to get him an appt today with Christell Faith, PA-C and will call back as soon as we have a time. Pt states he does not have any more oxycodone and will try Tylenol for pain.

## 2015-04-14 ENCOUNTER — Ambulatory Visit (INDEPENDENT_AMBULATORY_CARE_PROVIDER_SITE_OTHER): Payer: No Typology Code available for payment source | Admitting: Nurse Practitioner

## 2015-04-14 ENCOUNTER — Ambulatory Visit
Admission: RE | Admit: 2015-04-14 | Discharge: 2015-04-14 | Disposition: A | Discharge status: Home / Self Care | Payer: No Typology Code available for payment source | Source: Ambulatory Visit | Attending: Nurse Practitioner | Admitting: Nurse Practitioner

## 2015-04-14 ENCOUNTER — Encounter: Payer: Self-pay | Admitting: Nurse Practitioner

## 2015-04-14 VITALS — BP 120/88 | HR 102 | Ht 71.0 in | Wt 162.5 lb

## 2015-04-14 DIAGNOSIS — Z720 Tobacco use: Secondary | ICD-10-CM

## 2015-04-14 DIAGNOSIS — E785 Hyperlipidemia, unspecified: Secondary | ICD-10-CM

## 2015-04-14 DIAGNOSIS — R202 Paresthesia of skin: Secondary | ICD-10-CM | POA: Insufficient documentation

## 2015-04-14 DIAGNOSIS — M79672 Pain in left foot: Secondary | ICD-10-CM | POA: Insufficient documentation

## 2015-04-14 DIAGNOSIS — R030 Elevated blood-pressure reading, without diagnosis of hypertension: Secondary | ICD-10-CM

## 2015-04-14 DIAGNOSIS — I119 Hypertensive heart disease without heart failure: Secondary | ICD-10-CM

## 2015-04-14 DIAGNOSIS — I739 Peripheral vascular disease, unspecified: Secondary | ICD-10-CM

## 2015-04-14 DIAGNOSIS — I25110 Atherosclerotic heart disease of native coronary artery with unstable angina pectoris: Secondary | ICD-10-CM | POA: Diagnosis not present

## 2015-04-14 MED ORDER — OXYCODONE-ACETAMINOPHEN 5-325 MG PO TABS: 1.0000 | ORAL_TABLET | Freq: Four times a day (QID) | ORAL | Status: DC | PRN

## 2015-04-14 NOTE — Patient Instructions (Addendum)
Medication Instructions:  Please take oxycodone 5/325 mg 1 tab every 6 hrs as needed for foot pain  Labwork: None  Testing/Procedures: Please proceed to the [facility] for an x-ray of your foot  Follow-Up: As scheduled   If you need a refill on your cardiac medications before your next appointment, please call your pharmacy.   Steps to Quit Smoking  Smoking tobacco can be harmful to your health and can affect almost every organ in your body. Smoking puts you, and those around you, at risk for developing many serious chronic diseases. Quitting smoking is difficult, but it is one of the best things that you can do for your health. It is never too late to quit. WHAT ARE THE BENEFITS OF QUITTING SMOKING? When you quit smoking, you lower your risk of developing serious diseases and conditions, such as:  Lung cancer or lung disease, such as COPD.  Heart disease.  Stroke.  Heart attack.  Infertility.  Osteoporosis and bone fractures. Additionally, symptoms such as coughing, wheezing, and shortness of breath may get better when you quit. You may also find that you get sick less often because your body is stronger at fighting off colds and infections. If you are pregnant, quitting smoking can help to reduce your chances of having a baby of low birth weight. HOW DO I GET READY TO QUIT? When you decide to quit smoking, create a plan to make sure that you are successful. Before you quit:  Pick a date to quit. Set a date within the next two weeks to give you time to prepare.  Write down the reasons why you are quitting. Keep this list in places where you will see it often, such as on your bathroom mirror or in your car or wallet.  Identify the people, places, things, and activities that make you want to smoke (triggers) and avoid them. Make sure to take these actions:  Throw away all cigarettes at home, at work, and in your car.  Throw away smoking accessories, such as Scientist, physiological.  Clean your car and make sure to empty the ashtray.  Clean your home, including curtains and carpets.  Tell your family, friends, and coworkers that you are quitting. Support from your loved ones can make quitting easier.  Talk with your health care provider about your options for quitting smoking.  Find out what treatment options are covered by your health insurance. WHAT STRATEGIES CAN I USE TO QUIT SMOKING?  Talk with your healthcare provider about different strategies to quit smoking. Some strategies include:  Quitting smoking altogether instead of gradually lessening how much you smoke over a period of time. Research shows that quitting "cold [place]" is more successful than gradually quitting.  Attending in-person counseling to help you build problem-solving skills. You are more likely to have success in quitting if you attend several counseling sessions. Even short sessions of 10 minutes can be effective.  Finding resources and support systems that can help you to quit smoking and remain smoke-free after you quit. These resources are most helpful when you use them often. They can include:  Online chats with a Social worker.  Telephone quitlines.  Printed Furniture conservator/restorer.  Support groups or group counseling.  Text messaging programs.  Mobile phone applications.  Taking medicines to help you quit smoking. (If you are pregnant or breastfeeding, talk with your health care provider first.) Some medicines contain nicotine and some do not. Both types of medicines help with cravings, but the  medicines that include nicotine help to relieve withdrawal symptoms. Your health care provider may recommend:  Nicotine patches, gum, or lozenges.  Nicotine inhalers or sprays.  Non-nicotine medicine that is taken by mouth. Talk with your health care provider about combining strategies, such as taking medicines while you are also receiving in-person counseling. Using these two  strategies together makes you more likely to succeed in quitting than if you used either strategy on its own. If you are pregnant or breastfeeding, talk with your health care provider about finding counseling or other support strategies to quit smoking. Do not take medicine to help you quit smoking unless told to do so by your health care provider. WHAT THINGS CAN I DO TO MAKE IT EASIER TO QUIT? Quitting smoking might feel overwhelming at first, but there is a lot that you can do to make it easier. Take these important actions:  Reach out to your family and friends and ask that they support and encourage you during this time. Call telephone quitlines, reach out to support groups, or work with a counselor for support.  Ask people who smoke to avoid smoking around you.  Avoid places that trigger you to smoke, such as bars, parties, or smoke-break areas at work.  Spend time around people who do not smoke.  Lessen stress in your life, because stress can be a smoking trigger for some people. To lessen stress, try:  Exercising regularly.  Deep-breathing exercises.  Yoga.  Meditating.  Performing a body scan. This involves closing your eyes, scanning your body from head to toe, and noticing which parts of your body are particularly tense. Purposefully relax the muscles in those areas.  Download or purchase mobile phone or tablet apps (applications) that can help you stick to your quit plan by providing reminders, tips, and encouragement. There are many free apps, such as QuitGuide from the [employer] Office manager for Disease Control and Prevention). You can find other support for quitting smoking (smoking cessation) through smokefree.gov and other websites. HOW WILL I FEEL WHEN I QUIT SMOKING? Within the first 24 hours of quitting smoking, you may start to feel some withdrawal symptoms. These symptoms are usually most noticeable 2-3 days after quitting, but they usually do not last beyond 2-3 weeks. Changes  or symptoms that you might experience include:  Mood swings.  Restlessness, anxiety, or irritation.  Difficulty concentrating.  Dizziness.  Strong cravings for sugary foods in addition to nicotine.  Mild weight gain.  Constipation.  Nausea.  Coughing or a sore throat.  Changes in how your medicines work in your body.  A depressed mood.  Difficulty sleeping (insomnia). After the first 2-3 weeks of quitting, you may start to notice more positive results, such as:  Improved sense of smell and taste.  Decreased coughing and sore throat.  Slower heart rate.  Lower blood pressure.  Clearer skin.  The ability to breathe more easily.  Fewer sick days. Quitting smoking is very challenging for most people. Do not get discouraged if you are not successful the first time. Some people need to make many attempts to quit before they achieve long-term success. Do your best to stick to your quit plan, and talk with your health care provider if you have any questions or concerns.   This information is not intended to replace advice given to you by your health care provider. Make sure you discuss any questions you have with your health care provider.   Document Released: 03/27/2001 Document Revised: 08/17/2014 Document  Reviewed: 08/17/2014 Elsevier Interactive Patient Education 2016 [employer]. Smoking Hazards Smoking cigarettes is extremely bad for your health. Tobacco smoke has over 200 known poisons in it. It contains the poisonous gases nitrogen oxide and carbon monoxide. There are over 60 chemicals in tobacco smoke that cause cancer. Some of the chemicals found in cigarette smoke include:   Cyanide.   Benzene.   Formaldehyde.   Methanol (wood alcohol).   Acetylene (fuel used in welding torches).   Ammonia.  Even smoking lightly shortens your life expectancy by several years. You can greatly reduce the risk of medical problems for you and your family by stopping  now. Smoking is the most preventable cause of death and disease in our society. Within days of quitting smoking, your circulation improves, you decrease the risk of having a heart attack, and your lung capacity improves. There may be some increased phlegm in the first few days after quitting, and it may take months for your lungs to clear up completely. Quitting for 10 years reduces your risk of developing lung cancer to almost that of a nonsmoker.  WHAT ARE THE RISKS OF SMOKING? Cigarette smokers have an increased risk of many serious medical problems, including:  Lung cancer.   Lung disease (such as pneumonia, bronchitis, and emphysema).   Heart attack and chest pain due to the heart not getting enough oxygen (angina).   Heart disease and peripheral blood vessel disease.   Hypertension.   Stroke.   Oral cancer (cancer of the lip, mouth, or voice box).   Bladder cancer.   Pancreatic cancer.   Cervical cancer.   Pregnancy complications, including premature birth.   Stillbirths and smaller newborn babies, birth defects, and genetic damage to sperm.   Early menopause.   Lower estrogen level for women.   Infertility.   Facial wrinkles.   Blindness.   Increased risk of broken bones (fractures).   Senile dementia.   Stomach ulcers and internal bleeding.   Delayed wound healing and increased risk of complications during surgery. Because of secondhand smoke exposure, children of smokers have an increased risk of the following:   Sudden infant death syndrome (SIDS).   Respiratory infections.   Lung cancer.   Heart disease.   Ear infections.  WHY IS SMOKING ADDICTIVE? Nicotine is the chemical agent in tobacco that is capable of causing addiction or dependence. When you smoke and inhale, nicotine is absorbed rapidly into the bloodstream through your lungs. Both inhaled and noninhaled nicotine may be addictive.  WHAT ARE THE BENEFITS OF  QUITTING?  There are many health benefits to quitting smoking. Some are:   The likelihood of developing cancer and heart disease decreases. Health improvements are seen almost immediately.   Blood pressure, pulse rate, and breathing patterns start returning to normal soon after quitting.   People who quit may see an improvement in their overall quality of life.  HOW DO YOU QUIT SMOKING? Smoking is an addiction with both physical and psychological effects, and longtime habits can be hard to change. Your health care provider can recommend:  Programs and community resources, which may include group support, education, or therapy.  Replacement products, such as patches, gum, and nasal sprays. Use these products only as directed. Do not replace cigarette smoking with electronic cigarettes (commonly called e-cigarettes). The safety of e-cigarettes is unknown, and some may contain harmful chemicals. FOR MORE INFORMATION  American Lung Association: www.lung.org  American Cancer Society: www.cancer.org   This information is not intended to  replace advice given to you by your health care provider. Make sure you discuss any questions you have with your health care provider.   Document Released: 05/10/2004 Document Revised: 01/21/2013 Document Reviewed: 09/22/2012 Elsevier Interactive Patient Education 2016 [place] WHAT IS SECONDHAND SMOKE? Secondhand smoke is smoke that comes from burning tobacco. It could be the smoke from a cigarette, a pipe, or a cigar. Even if you are not the one smoking, secondhand smoke exposes you to the dangers of smoking. This is called involuntary, or passive, smoking. There are two types of secondhand smoke:  Sidestream smoke is the smoke that comes off the lighted end of a cigarette, pipe, or cigar.  This type of smoke has the highest amount of cancer-causing agents (carcinogens).  The particles in sidestream smoke are smaller. They get  into your lungs more easily.  Mainstream smoke is the smoke that is exhaled by a person who is smoking.  This type of smoke is also dangerous to your health. HOW CAN SECONDHAND SMOKE AFFECT MY HEALTH? Studies show that there is no safe level of secondhand smoke. This smoke contains thousands of chemicals. At least 82 of them are known to cause cancer. Secondhand smoke can also cause many other health problems. It has been linked to:  Lung cancer.  Cancer of the voice box (larynx) or throat.  Cancer of the sinuses.  Brain cancer.  Bladder cancer.  Stomach cancer.  Breast cancer.  White blood cell cancers (lymphoma and leukemia).  Brain and liver tumors in children.  Heart disease and stroke in adults.  Pregnancy loss (miscarriage).  Diseases in children, such as:  Asthma.  Lung infections.  Ear infections.  Sudden infant death syndrome (SIDS).  Slow growth. WHERE CAN I BE AT RISK FOR EXPOSURE TO SECONDHAND SMOKE?   For adults, the workplace is the main source of exposure to secondhand smoke.  Your workplace should have a policy separating smoking areas from nonsmoking areas.  Smoking areas should have a system for ventilating and cleaning the air.  For children, the home may be the most dangerous place for exposure to secondhand smoke.  Children who live in apartment buildings may be at risk from smoke drifting from hallways or other people's homes.  For everyone, many public places are possible sources of exposure to secondhand smoke.  These places include restaurants, shopping centers, and parks. HOW CAN I REDUCE MY RISK FOR EXPOSURE TO SECONDHAND SMOKE? The most important thing you can do is not smoke. Discourage family members from smoking. Other ways to reduce exposure for you and your family include the following:  Keep your home smoke free.  Make sure your child care providers do not smoke.  Warn your child about the dangers of smoking and  secondhand smoke.  Do not allow smoking in your car. When someone smokes in a car, all the damaging chemicals from the smoke are confined in a small area.  Avoid public places where smoking is allowed.   This information is not intended to replace advice given to you by your health care provider. Make sure you discuss any questions you have with your health care provider.   Document Released: 05/10/2004 Document Revised: 04/23/2014 Document Reviewed: 07/17/2013 Elsevier Interactive Patient Education [employer].

## 2015-04-14 NOTE — Progress Notes (Signed)
Office Visit    Patient Name: Philip [name] Date of Encounter: 04/14/2015  Primary Care Provider:  Maryellen Pile, MD Primary Cardiologist:  Jerilynn Mages. Fletcher Anon, MD   Chief Complaint    54 y/o male with a h/o CAD s/p CABG and PAD s/p recent L popliteal stenting, who presents for f/u related to left foot pain.  Past Medical History    Past Medical History  Diagnosis Date  . CAD (coronary artery disease)     a. 08/2014 Inf STEMI/CABG x 3 (LIMA->LAD, VG->Diag, RIMA->RCA).  Marland Kitchen PAD (peripheral artery disease) ([place])     a. 03/2015 Periph Angio: short occlusion of L pop w/ evidence of embolization into the DP->5.0x50 mm Inova self-expanding stent;  b. 04/08/2015 ABI: R 1.12, L 0.99.  . Tobacco abuse   . Hypertensive heart disease   . Hyperlipidemia    Past Surgical History  Procedure Laterality Date  . Coronary artery bypass graft N/A 08/25/2014    Procedure: CORONARY ARTERY BYPASS GRAFTING (CABG), ON PUMP, TIMES THREE, USING BILATERAL MAMMARY ARTERIES, RIGHT GREATER SAPHENOUS VEIN HARVESTED ENDOSCOPICALLY;  Surgeon: Melrose Nakayama, MD;  Location: [place];  Service: Open Heart Surgery;  Laterality: N/A;  . Tee without cardioversion N/A 08/25/2014    Procedure: TRANSESOPHAGEAL ECHOCARDIOGRAM (TEE);  Surgeon: Melrose Nakayama, MD;  Location: [place];  Service: Open Heart Surgery;  Laterality: N/A;  . Cardiac catheterization N/A 08/24/2014    Procedure: Left Heart Cath and Coronary Angiography;  Surgeon: Isaias Cowman, MD;  Location: [place] [facility];  Service: Cardiovascular;  Laterality: N/A;  . Cardiac catheterization N/A 08/24/2014    Procedure: Coronary Stent Intervention;  Surgeon: Isaias Cowman, MD;  Location: [place] [facility];  Service: Cardiovascular;  Laterality: N/A;  . Peripheral vascular catheterization N/A 03/30/2015    Procedure: Abdominal Aortogram w/Lower Extremity;  Surgeon: Wellington Hampshire, MD;  Location: [place] [facility];  Service: Cardiovascular;   Laterality: N/A;    Allergies  Allergies  Allergen Reactions  . Other Swelling    Other reaction(s): Other (See Comments) Pt states that he got "knots behind his ears".  . Contrast Media [Iodinated Diagnostic Agents] Other (See Comments)    Pt states that he got "knots behind his ears".  . Nsaids Other (See Comments)    Other reaction(s): Other (See Comments) Reaction:  Blood in stool  Reaction:  Blood in stool   . Ibuprofen Other (See Comments) and Nausea Only    Blood in stools    History of Present Illness    54 y/o male with the above complex problem list.  He is s/p inferior STEMI in 08/2014 with subsequent emergent CABG x 3.  He continues to smoke cigarettes.  More recently, he was seen by Dr. Fletcher Anon with complaints of left lower extremity claudication.  ABI was performed and was abnl on the left @ 0.44.  He underwent peripheral angiography on 12/14 revealing a short occlusion of the left popliteal artery with evidence of distal embolization to the DP.  The L popliteal was successfully stented.  [name] says that about 2 days after the procedure, he began to note paresthesias on both the plantar and dorsal surfaces of his left foot.  This was later followed by sharp pain on both surfaces, which has been persistent.  He denies change in color, sensation, hair distribution, or tenderness.  He called the office and an ABI/duplex was obtained on 12/23, and was nl @ 1.12 on the right and  0.99 on the left.  He presents for evaluation today.  No change in Ss over the weekend.  He has been compliant with ASA/Plavix. He does report a h/o neuropathy for which he takes gabapentin.  Home Medications    Prior to Admission medications   Medication Sig Start Date End Date Taking? Authorizing Provider  aspirin EC 325 MG EC tablet Take 1 tablet (325 mg total) by mouth daily. 09/01/14  Yes Gina L Collins, PA-C  atorvastatin (LIPITOR) 40 MG tablet Take 1 tablet (40 mg total) by mouth daily. 09/01/14   Yes Coolidge Breeze, PA-C  clopidogrel (PLAVIX) 75 MG tablet Take 1 tablet (75 mg total) by mouth daily. 03/30/15  Yes Wellington Hampshire, MD  cyanocobalamin (,VITAMIN B-12,) 1000 MCG/ML injection Inject 1,000 mcg into the muscle once a week. 01/31/15  Yes Historical Provider, MD  diphenhydrAMINE (BENADRYL) 25 mg capsule Take 25 mg by mouth 2 (two) times daily as needed for allergies.    Yes Historical Provider, MD  fluticasone (FLONASE) 50 MCG/ACT nasal spray Place 1 spray into both nostrils daily. Patient taking differently: Place 1 spray into both nostrils daily as needed for allergies.  09/14/14 09/14/15 Yes Tasrif Ahmed, MD  gabapentin (NEURONTIN) 300 MG capsule Take 300-600 mg by mouth 3 (three) times daily. Take 1 capsule twice a day Take 2 capsules at bedtime   Yes Historical Provider, MD  lisinopril (PRINIVIL,ZESTRIL) 5 MG tablet Take 1 tablet (5 mg total) by mouth daily. 09/01/14  Yes Coolidge Breeze, PA-C  metoprolol tartrate (LOPRESSOR) 25 MG tablet Take 1 tablet (25 mg total) by mouth 2 (two) times daily. 09/01/14  Yes Coolidge Breeze, PA-C  omeprazole (PRILOSEC OTC) 20 MG tablet Take 20 mg by mouth daily.   Yes Historical Provider, MD  oxyCODONE-acetaminophen (ROXICET) 5-325 MG tablet Take 1 tablet by mouth every 6 (six) hours as needed for severe pain (foot pain). 04/14/15   Rogelia Mire, NP    Review of Systems    Left foot paresthesias and pain as outlined above.  He denies chest pain, palpitations, dyspnea, pnd, orthopnea, n, v, dizziness, syncope, edema, weight gain, or early satiety.  All other systems reviewed and are otherwise negative except as noted above.  Physical Exam    VS:  BP 120/88 mmHg  Pulse 102  Ht '5\' 11"'$  (1.803 m)  Wt 162 lb 8 oz (73.71 kg)  BMI 22.67 kg/m2 , BMI Body mass index is 22.67 kg/(m^2). GEN: Well nourished, well developed, in no acute distress. HEENT: normal. Neck: Supple, no JVD, carotid bruits, or masses. Cardiac: RRR, no murmurs, rubs, or  gallops. No clubbing, cyanosis, edema.  Radials/DP/PT 2+ on right.  L DP/PT 1+. L foot is warm with brisk cap refill and nl sensation.  Non-tender. Respiratory:  Respirations regular and unlabored, clear to auscultation bilaterally. GI: Soft, nontender, nondistended, BS + x 4. MS: no deformity or atrophy. Skin: warm and dry, no rash. Neuro:  Strength and sensation are intact. Psych: Normal affect.  Accessory Clinical Findings    ECG - sinus tachycardia, 102, nonspec st/t changes.  No acute changes.  Assessment & Plan    1.  PAD/L Foot Pain and Paresthesias:  Pt recently underwent peripheral angiography, which showed occlusive disease of the left popliteal with distal embolization to the left dorsalis pedis. The left popliteal was successfully stented. Approximately 2 days after the procedure, patient began to experience left foot paresthesias and pain. He has already had follow-up ABIs revealing  an ABI of 0.99 on the left. On exam, his foot is warm with brisk capillary refill and normal sensation. He has 1+ left dorsalis pedis pulse. I reviewed his ABI results with Dr. Rockey Situ today. In light of normal ABI on the left, pain seems unlikely to be related to vascular insufficiency. I will obtain an x-ray of his left foot to rule out fracture. He does have a history of neuropathy on the right and is already on gabapentin. He has been seen by a foot specialist in the past for Charcot's right foot and also neurology. If x-ray is unrevealing, recommend follow-up with neuro. As he is having significant pain, I did give him a prescription for oxycodone 5/325 mg 1 tab by mouth every 6 hours when necessary severe pain 20 tablets with 0 refills. He has been compliant with aspirin and Plavix and remains on these. Next  2. Coronary artery disease: Status post inferior MI and subsequent CABG 3 in May 2016. He has not been having any chest pain. He remains on aspirin, statin, beta blocker, and ACE inhibitor  therapy.  3. Hypertensive heart disease: Stable on beta blocker and ACE inhibitor. Next  4. Hyperlipidemia: He is on Lipitor therapy and tolerating well.  5. Tobacco abuse: We discussed the importance of complete smoking cessation. He is not currently willing to commit to quitting.  6. Disposition: Follow-up x-ray today. Follow-up with Dr. Fletcher Anon in one month or sooner if necessary.   Murray Hodgkins, NP 04/14/2015, 12:05 PM

## 2015-05-09 ENCOUNTER — Ambulatory Visit: Payer: No Typology Code available for payment source | Admitting: Physician Assistant

## 2015-05-18 ENCOUNTER — Other Ambulatory Visit: Payer: Self-pay | Admitting: *Deleted

## 2015-05-18 NOTE — Telephone Encounter (Signed)
°*  STAT* If patient is at the pharmacy, call can be transferred to refill team.   1. Which medications need to be refilled? (please list name of each medication and dose if known)  Losartan and Atorvastatin   2. Which pharmacy/location (including street and city if local pharmacy) is medication to be sent to? [facility] on [address]   3. Do they need a 30 day or 90 day supply? 90 day

## 2015-05-19 MED ORDER — ATORVASTATIN CALCIUM 40 MG PO TABS: 40.0000 mg | ORAL_TABLET | Freq: Every day | ORAL | Status: DC

## 2015-05-19 MED ORDER — LISINOPRIL 5 MG PO TABS
5.0000 mg | ORAL_TABLET | Freq: Every day | ORAL | Status: DC
Start: 2015-05-19 — End: 2016-06-11

## 2015-05-19 NOTE — Telephone Encounter (Signed)
Refill sent for atorvastatin & patient is taking Lisinopril.

## 2015-05-20 ENCOUNTER — Ambulatory Visit: Payer: No Typology Code available for payment source | Admitting: Cardiovascular Disease

## 2015-06-09 ENCOUNTER — Encounter: Payer: Self-pay | Admitting: Internal Medicine

## 2015-06-09 ENCOUNTER — Ambulatory Visit (INDEPENDENT_AMBULATORY_CARE_PROVIDER_SITE_OTHER): Payer: BLUE CROSS/BLUE SHIELD | Admitting: Internal Medicine

## 2015-06-09 VITALS — BP 135/80 | HR 70 | Temp 98.2°F | Ht 70.0 in | Wt 161.4 lb

## 2015-06-09 DIAGNOSIS — Z0000 Encounter for general adult medical examination without abnormal findings: Secondary | ICD-10-CM

## 2015-06-09 DIAGNOSIS — G629 Polyneuropathy, unspecified: Secondary | ICD-10-CM

## 2015-06-09 DIAGNOSIS — Z23 Encounter for immunization: Secondary | ICD-10-CM | POA: Diagnosis not present

## 2015-06-09 DIAGNOSIS — I10 Essential (primary) hypertension: Secondary | ICD-10-CM

## 2015-06-09 LAB — GLUCOSE, CAPILLARY: Glucose-Capillary: 110 mg/dL — ABNORMAL HIGH (ref 65–99)

## 2015-06-09 LAB — POCT GLYCOSYLATED HEMOGLOBIN (HGB A1C): HEMOGLOBIN A1C: 5.4

## 2015-06-09 MED ORDER — GABAPENTIN 400 MG PO CAPS: 800.0000 mg | ORAL_CAPSULE | Freq: Three times a day (TID) | ORAL | Status: DC

## 2015-06-09 MED ORDER — OXYCODONE-ACETAMINOPHEN 5-325 MG PO TABS: 1.0000 | ORAL_TABLET | Freq: Four times a day (QID) | ORAL | Status: DC | PRN

## 2015-06-09 NOTE — Progress Notes (Signed)
Patient ID: Philip [name], male   DOB: 02/16/1961, 55 y.o.   MRN: [id number]   Subjective:   Patient ID: Philip [name] male   DOB: [birth year] 55 y.o.   MRN: [id number]  HPI: Philip [name] is a 55 y.o. pleasant man with past medical history of PAD s/p left stent, CAD s/p CABG, MI, hypertension, hyperlipidemia, chronic normocytic anemia, history of seizures, GERD, and tobacco use who presents with chief complaint of bilateral LE burning pain.   He has chronic peripheral neuropathy (LE>UE) with burning pain and numbness/tingling worse in his feet that extends up to his legs that started in December of 2015. MRI of the right ankle on 07/30/14 revealed multifocal marrow edema with extensive soft tissue edema. Xray of the right foot on 11/17/13 revealed diffuse osteopenia. Left foot xray on 04/14/15 was normal. He was seen by orthopedist Dr. Sharol Given who thought he had gout and treated him with corticosteroid injection in the ankle and medical therapy with colchicine and NSAIDs with no relief. He was seen by neurologist Dr. Trula Ore who placed him on weekly B12 injections for presumed deficiency (no B12 level in system). 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 He has also been seen by podiatry and has several different shoes but no orthotics. He is currently on gabapentin 300-600 mg BID which does not help. He was previously on short-term norco which did help reduce the pain. He has difficulty with ambulation and had recent fall.  He reports bilateral LE weakness and uses a walker to ambulate. He has flat feet but denies other foot deformity. He has not had physical therapy. He reports his mother had hammer toe and peripheral neuropathy. His son also has peripheral neuropathy. He has PAD with stenting in his left leg. His is not diabetic and last A1c was 5.6.   He is compliant with taking lopressor and lisinopril for hypertension. He denies blurry vision from baseline,  headache, chest pain, LE edema, or lightheadeddness.   He would like to have tdap vaccination.    Past Medical History  Diagnosis Date  . CAD (coronary artery disease)     a. 08/2014 Inf STEMI/CABG x 3 (LIMA->LAD, VG->Diag, RIMA->RCA).  Marland Kitchen PAD (peripheral artery disease) ([place])     a. 03/2015 Periph Angio: short occlusion of L pop w/ evidence of embolization into the DP->5.0x50 mm Inova self-expanding stent;  b. 04/08/2015 ABI: R 1.12, L 0.99.  . Tobacco abuse   . Hypertensive heart disease   . Hyperlipidemia    Current Outpatient Prescriptions  Medication Sig Dispense Refill  . aspirin EC 325 MG EC tablet Take 1 tablet (325 mg total) by mouth daily. 30 tablet 0  . atorvastatin (LIPITOR) 40 MG tablet Take 1 tablet (40 mg total) by mouth daily. 90 tablet 3  . clopidogrel (PLAVIX) 75 MG tablet Take 1 tablet (75 mg total) by mouth daily. 30 tablet 6  . cyanocobalamin (,VITAMIN B-12,) 1000 MCG/ML injection Inject 1,000 mcg into the muscle once a week.    . diphenhydrAMINE (BENADRYL) 25 mg capsule Take 25 mg by mouth 2 (two) times daily as needed for allergies.     . fluticasone (FLONASE) 50 MCG/ACT nasal spray Place 1 spray into both nostrils daily. (Patient taking differently: Place 1 spray into both nostrils daily as needed for allergies. ) 16 g 2  . gabapentin (NEURONTIN) 300 MG capsule Take 300-600 mg by mouth 3 (three) times daily. Take 1  capsule twice a day Take 2 capsules at bedtime    . lisinopril (PRINIVIL,ZESTRIL) 5 MG tablet Take 1 tablet (5 mg total) by mouth daily. 90 tablet 3  . metoprolol tartrate (LOPRESSOR) 25 MG tablet Take 1 tablet (25 mg total) by mouth 2 (two) times daily. 60 tablet 1  . omeprazole (PRILOSEC OTC) 20 MG tablet Take 20 mg by mouth daily.    Marland Kitchen oxyCODONE-acetaminophen (ROXICET) 5-325 MG tablet Take 1 tablet by mouth every 6 (six) hours as needed for severe pain (foot pain). 20 tablet 0   No current facility-administered medications for this visit.   Family  History  Problem Relation Age of Onset  . Hypertension Father   . Heart attack Mother 72    STENT  . Hyperlipidemia Mother   . Heart attack Brother   . Heart attack Brother   . Breast cancer Sister   . Heart attack Sister    Social History   Social History  . Marital Status: Married    Spouse Name: N/A  . Number of Children: N/A  . Years of Education: N/A   Social History Main Topics  . Smoking status: Current Every Day Smoker -- 0.20 packs/day    Types: Cigarettes  . Smokeless tobacco: None     Comment: 4cigs day down from 2ppd  . Alcohol Use: 0.0 oz/week    0 Standard drinks or equivalent per week     Comment: Sometimes on w/e.  . Drug Use: No  . Sexual Activity: Not Asked   Other Topics Concern  . None   Social History Narrative   Review of Systems: Review of Systems  Constitutional: Positive for weight loss and malaise/fatigue. Negative for fever and chills.       Decreased appetite  Eyes: Positive for blurred vision (chronic ).  Respiratory: Negative for cough, shortness of breath and wheezing.   Cardiovascular: Positive for claudication. Negative for chest pain and leg swelling.  Gastrointestinal: Positive for nausea. Negative for vomiting, abdominal pain, diarrhea and constipation.  Genitourinary: Negative for dysuria, urgency and frequency.  Musculoskeletal: Positive for joint pain (feet and ankles) and falls. Negative for back pain.       B/l LE pain  Skin: Negative for rash.  Neurological: Positive for sensory change (chronic peripheral neuropathy LE>UE) and focal weakness (b/l LE). Negative for dizziness, seizures and headaches.       Imbalance  Psychiatric/Behavioral: The patient has insomnia (due to pain).     Objective:  Physical Exam: Filed Vitals:   06/09/15 0831  BP: 135/80  Pulse: 70  Temp: 98.2 F (36.8 C)  TempSrc: Oral  Height: '5\' 10"'$  (1.778 m)  Weight: 161 lb 6.4 oz (73.211 kg)  SpO2: 100%    Physical Exam  Constitutional: He is  oriented to person, place, and time. He appears well-developed and well-nourished. No distress.  HENT:  Head: Normocephalic and atraumatic.  Right Ear: External ear normal.  Left Ear: External ear normal.  Nose: Nose normal.  Mouth/Throat: Oropharynx is clear and moist. No oropharyngeal exudate.  Eyes: Conjunctivae and EOM are normal. Pupils are equal, round, and reactive to light. Right eye exhibits no discharge. Left eye exhibits no discharge. No scleral icterus.  Neck: Normal range of motion. Neck supple.  Cardiovascular: Normal rate, regular rhythm and normal heart sounds.   Pulmonary/Chest: Breath sounds normal. No respiratory distress. He has no wheezes. He has no rales.  Abdominal: Soft. Bowel sounds are normal. He exhibits no distension. There is  no tenderness. There is no rebound and no guarding.  Musculoskeletal: Normal range of motion. He exhibits edema (trace b/l LE) and tenderness (b/l LE and feet).  Pes planus of b/l feet  Neurological: He is alert and oriented to person, place, and time.  Muscle strength 5/5 throughout. Normal sensation to light touch of extremities.   Skin: Skin is warm and dry. No rash noted. He is not diaphoretic. No erythema. No pallor.  Psychiatric: He has a normal mood and affect. His behavior is normal. Judgment and thought content normal.    Assessment & Plan:   Please see problem list for problem-based assessment and plan

## 2015-06-09 NOTE — Patient Instructions (Addendum)
-Will check your bloodwork today and call you with the results -Take gabapentin 800 mg three times daily and percocet every 6 hrs as needed for pain -Will refer you to neurology. You may need physical therapy. -Will give you a tetanus shot today -Very nice meeting you, please come back in 1 month for follow-up    Peripheral Neuropathy Peripheral neuropathy is a type of nerve damage. It affects nerves that carry signals between the spinal cord and other parts of the body. These are called peripheral nerves. With peripheral neuropathy, one nerve or a group of nerves may be damaged.  CAUSES  Many things can damage peripheral nerves. For some people with peripheral neuropathy, the cause is unknown. Some causes include:  Diabetes. This is the most common cause of peripheral neuropathy.  Injury to a nerve.  Pressure or stress on a nerve that lasts a long time.  Too little vitamin B. Alcoholism can lead to this.  Infections.  Autoimmune diseases, such as multiple sclerosis and systemic lupus erythematosus.  Inherited nerve diseases.  Some medicines, such as cancer drugs.  Toxic substances, such as lead and mercury.  Too little blood flowing to the legs.  Kidney disease.  Thyroid disease. SIGNS AND SYMPTOMS  Different people have different symptoms. The symptoms you have will depend on which of your nerves is damaged. Common symptoms include:  Loss of feeling (numbness) in the feet and hands.  Tingling in the feet and hands.  Pain that burns.  Very sensitive skin.  Weakness.  Not being able to move a part of the body (paralysis).  Muscle twitching.  Clumsiness or poor coordination.  Loss of balance.  Not being able to control your bladder.  Feeling dizzy.  Sexual problems. DIAGNOSIS  Peripheral neuropathy is a symptom, not a disease. Finding the cause of peripheral neuropathy can be hard. To figure that out, your health care provider will take a medical history  and do a physical exam. A neurological exam will also be done. This involves checking things affected by your brain, spinal cord, and nerves (nervous system). For example, your health care provider will check your reflexes, how you move, and what you can feel.  Other types of tests may also be ordered, such as:  Blood tests.  A test of the fluid in your spinal cord.  Imaging tests, such as CT scans or an MRI.  Electromyography (EMG). This test checks the nerves that control muscles.  Nerve conduction velocity tests. These tests check how fast messages pass through your nerves.  Nerve biopsy. A small piece of nerve is removed. It is then checked under a microscope. TREATMENT   Medicine is often used to treat peripheral neuropathy. Medicines may include:  Pain-relieving medicines. Prescription or over-the-counter medicine may be suggested.  Antiseizure medicine. This may be used for pain.  Antidepressants. These also may help ease pain from neuropathy.  Lidocaine. This is a numbing medicine. You might wear a patch or be given a shot.  Mexiletine. This medicine is typically used to help control irregular heart rhythms.  Surgery. Surgery may be needed to relieve pressure on a nerve or to destroy a nerve that is causing pain.  Physical therapy to help movement.  Assistive devices to help movement. HOME CARE INSTRUCTIONS   Only take over-the-counter or prescription medicines as directed by your health care provider. Follow the instructions carefully for any given medicines. Do not take any other medicines without first getting approval from your health care provider.  If you have diabetes, work closely with your health care provider to keep your blood sugar under control.  If you have numbness in your feet:  Check every day for signs of injury or infection. Watch for redness, warmth, and swelling.  Wear padded socks and comfortable shoes. These help protect your feet.  Do not  do things that put pressure on your damaged nerve.  Do not smoke. Smoking keeps blood from getting to damaged nerves.  Avoid or limit alcohol. Too much alcohol can cause a lack of B vitamins. These vitamins are needed for healthy nerves.  Develop a good support system. Coping with peripheral neuropathy can be stressful. Talk to a mental health specialist or join a support group if you are struggling.  Follow up with your health care provider as directed. SEEK MEDICAL CARE IF:   You have new signs or symptoms of peripheral neuropathy.  You are struggling emotionally from dealing with peripheral neuropathy.  You have a fever. SEEK IMMEDIATE MEDICAL CARE IF:   You have an injury or infection that is not healing.  You feel very dizzy or begin vomiting.  You have chest pain.  You have trouble breathing.   This information is not intended to replace advice given to you by your health care provider. Make sure you discuss any questions you have with your health care provider.   Document Released: 03/23/2002 Document Revised: 12/13/2010 Document Reviewed: 12/08/2012 Elsevier Interactive Patient Education 2016 Elsevier Inc[employer]     Charcot-Marie-Tooth Disease Charcot-Marie-Tooth (CMT) disease is a group of diseases that affect the nerves to the arms and legs. These are the nerves located outside of the spinal cord and brain (peripheral nerves). This condition is usually passed down through families (inherited). Among inherited diseases that affect the nerves, CMT is one of the most common. It can cause weakness that ranges from very mild to severe. There are several forms of CMT:  CMT1. This form of CMT often begins in adolescence. CMT1 has three subtypes:  CMT1A.  CMT1B.  Hereditary neuropathy with predisposition to pressure palsy (HNPP).  CMT2. This form of CMT is less common than CMT1.  CMT3. This form of CMT begins in infancy. It is also called Dejerine-Sottas disease.  CMT4.  This form of CMT begins in childhood or adolescence and affects people of certain races.  CMTX. This form of CMT begins in late childhood or adolescence and mainly affects males. Females with this form of CMT may have mild symptoms or no symptoms. CAUSES CMT is caused by defects (mutations) in the genes that affect the peripheral nerves. Over time, these mutations cause the nerves to break down. The nerves lose the ability to communicate with parts of the body, causing the symptoms of CMT. RISK FACTORS You may be at higher risk of CMT if one or both of your parents carry the gene for CMT. Some forms of CMT can develop from inheriting the gene from one parent, and other forms of CMT develop from inheriting the gene from both parents. SIGNS AND SYMPTOMS Signs and symptoms of CMT may vary depending on the form of CMT you have. The symptoms will start gradually and get worse over time. Symptoms may include:  Foot or lower leg muscle weakness.  Trouble walking.  An unusual pace and stride (gait).  Frequent falls.  Deformity of the lower legs and feet.  Mild to severe pain. As the disease progresses, it often affects other parts of the body. Later symptoms include:  Weakness in the hands.  Loss of fine motor skills (dexterity).  Loss of feelings in the hands, wrist, or tongue. DIAGNOSIS Your health care provider may suspect CMT based on:  Your symptoms and a physical exam.  Your medical and family history.  An exam done by a health care provider who specializes in diseases of the brain and central nervous system (neurologist). You may also need to have tests, including:  Electromyography (EMG).  Nerve conduction studies.  Blood tests to confirm the diagnosis.  A procedure to collect a sample of nerve tissue (biopsy) to examine under a microscope. TREATMENT There is no cure for CMT. Some types of treatment can help to relieve symptoms and improve mobility. These  include:  Physical therapy to:  Strengthen muscles.  Improve stretching.  Increase stamina.  Help you move as easily as possible.  Occupational therapy to help you perform basic activities.  Assistive devices, such as special shoes, ankle braces, and thumb splints.  Surgery to repair deformities in your feet or joints.  Medicine to relieve severe pain. HOME CARE INSTRUCTIONS  Follow all of your health care provider's instructions carefully.  Take medicines only as directed by your health care provider.  Do physical therapy exercises at home as instructed. These help with strength and mobility.  Wear your assistive devices as necessary to help with mobility and prevent accidents and injuries. SEEK MEDICAL CARE IF:  You experience new pain.  You have hearing loss.  You develop new wounds on your feet or legs.  Your CMT symptoms begin to affect other areas of your body. SEEK IMMEDIATE MEDICAL CARE IF:  Your pain is very bad.  You have shortness of breath.   This information is not intended to replace advice given to you by your health care provider. Make sure you discuss any questions you have with your health care provider.   Document Released: 03/23/2002 Document Revised: 04/23/2014 Document Reviewed: 08/05/2013 Elsevier Interactive Patient Education 2016 [place] Instructions:   Please bring your medicines with you each time you come to clinic.  Medicines may include prescription medications, over-the-counter medications, herbal remedies, eye drops, vitamins, or other pills.   Progress Toward Treatment Goals:  Treatment Goal 07/06/2014  Stop smoking smoking the same amount    Self Care Goals & Plans:  Self Care Goal 11/18/2014  Manage my medications take my medicines as prescribed; bring my medications to every visit; refill my medications on time  Eat healthy foods drink diet soda or water instead of juice or soda; eat more vegetables; eat  foods that are low in salt; eat baked foods instead of fried foods; eat fruit for snacks and desserts  Be physically active -  Stop smoking -    No flowsheet data found.   Care Management & Community Referrals:  No flowsheet data found.

## 2015-06-10 LAB — CK: Total CK: 48 U/L (ref 24–204)

## 2015-06-10 LAB — CBC WITH DIFFERENTIAL/PLATELET
BASOS: 1 %
Basophils Absolute: 0.1 10*3/uL (ref 0.0–0.2)
EOS (ABSOLUTE): 0.2 10*3/uL (ref 0.0–0.4)
EOS: 4 %
HEMATOCRIT: 37.3 % — AB (ref 37.5–51.0)
HEMOGLOBIN: 11.7 g/dL — AB (ref 12.6–17.7)
IMMATURE GRANS (ABS): 0 10*3/uL (ref 0.0–0.1)
Immature Granulocytes: 0 %
LYMPHS: 42 %
Lymphocytes Absolute: 2.3 10*3/uL (ref 0.7–3.1)
MCH: 25.3 pg — AB (ref 26.6–33.0)
MCHC: 31.4 g/dL — ABNORMAL LOW (ref 31.5–35.7)
MCV: 81 fL (ref 79–97)
MONOCYTES: 8 %
Monocytes Absolute: 0.4 10*3/uL (ref 0.1–0.9)
NEUTROS ABS: 2.5 10*3/uL (ref 1.4–7.0)
Neutrophils: 45 %
Platelets: 342 10*3/uL (ref 150–379)
RBC: 4.63 x10E6/uL (ref 4.14–5.80)
RDW: 17.2 % — ABNORMAL HIGH (ref 12.3–15.4)
WBC: 5.5 10*3/uL (ref 3.4–10.8)

## 2015-06-10 LAB — CMP14 + ANION GAP
A/G RATIO: 1.3 (ref 1.1–2.5)
ALT: 21 IU/L (ref 0–44)
ANION GAP: 19 mmol/L — AB (ref 10.0–18.0)
AST: 27 IU/L (ref 0–40)
Albumin: 3.5 g/dL (ref 3.5–5.5)
Alkaline Phosphatase: 94 IU/L (ref 39–117)
BUN/Creatinine Ratio: 5 — ABNORMAL LOW (ref 9–20)
BUN: 3 mg/dL — AB (ref 6–24)
Bilirubin Total: <0.2 mg/dL (ref 0.0–1.2)
CALCIUM: 8.9 mg/dL (ref 8.7–10.2)
CO2: 17 mmol/L — ABNORMAL LOW (ref 18–29)
Chloride: 104 mmol/L (ref 96–106)
Creatinine, Ser: 0.6 mg/dL — ABNORMAL LOW (ref 0.76–1.27)
GFR calc Af Amer: 132 mL/min/{1.73_m2} (ref >59)
GFR, EST NON AFRICAN AMERICAN: 114 mL/min/{1.73_m2} (ref >59)
GLUCOSE: 110 mg/dL — AB (ref 65–99)
Globulin, Total: 2.6 g/dL (ref 1.5–4.5)
POTASSIUM: 4.2 mmol/L (ref 3.5–5.2)
Sodium: 140 mmol/L (ref 134–144)
Total Protein: 6.1 g/dL (ref 6.0–8.5)

## 2015-06-10 LAB — SEDIMENTATION RATE: SED RATE: 19 mm/h (ref 0–30)

## 2015-06-10 LAB — HEPATITIS PANEL, ACUTE
HEP A IGM: NEGATIVE
Hep B C IgM: NEGATIVE
Hepatitis B Surface Ag: NEGATIVE

## 2015-06-10 LAB — PROTEIN ELECTROPHORESIS, SERUM, WITH REFLEX
A/G RATIO SPE: 1 (ref 0.7–1.7)
ALPHA 2: 0.7 g/dL (ref 0.4–1.0)
Albumin ELP: 3 g/dL (ref 2.9–4.4)
Alpha 1: 0.2 g/dL (ref 0.0–0.4)
BETA: 1.1 g/dL (ref 0.7–1.3)
GAMMA GLOBULIN: 1 g/dL (ref 0.4–1.8)
GLOBULIN, TOTAL: 3.1 g/dL (ref 2.2–3.9)

## 2015-06-10 LAB — TSH: TSH: 0.756 u[IU]/mL (ref 0.450–4.500)

## 2015-06-10 LAB — VITAMIN B12: Vitamin B-12: 504 pg/mL (ref 211–946)

## 2015-06-10 LAB — HIV ANTIBODY (ROUTINE TESTING W REFLEX): HIV Screen 4th Generation wRfx: NONREACTIVE

## 2015-06-10 LAB — ANTINUCLEAR ANTIBODIES, IFA: ANA TITER 1: NEGATIVE

## 2015-06-10 LAB — RPR: RPR Ser Ql: NONREACTIVE

## 2015-06-12 DIAGNOSIS — G629 Polyneuropathy, unspecified: Secondary | ICD-10-CM | POA: Insufficient documentation

## 2015-06-12 NOTE — Assessment & Plan Note (Signed)
Assessment: Pt with well-controlled hypertension compliant with two-class (BB & ACEi) anti-hypertensive therapy who presents with blood pressure of 135/80.   Plan: -BP 135/80 at goal <140/90 -Continue lopressor 25 mg BID and lisinopril 5 mg daily  -Obtain CMP ---> normal

## 2015-06-12 NOTE — Assessment & Plan Note (Addendum)
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   Plan:  -Refer to neurology in [place] for EMG/NCS and possible genetic testing for Charcot-Marie Tooth Disease -Obtain CMP, CBC w/dif, ANA, ESR, CK, B12, HIV Ab, RPR, acute hepatitis panel, TSH, A1c, and SPEP -Increase gabapentin from 300-600 mg BID to 800 mg TID (normal renal function) -Prescribe 30-day supply of oxycodone-acetaminophen 5-325 mg Q 6 hr PRN pain pending neurology evaluation -Continue walker due to LE weakness and imbalance -Pt may need physical therapy pending neurology evaluation and EMG/NCS  ADDENDUM on 06/10/15:  Pt with B12 level of 504, pt instructed to trial off B12 weekly injections and will recheck levels at next visit. Pt with no documented B12 deficiency warranting long-term replacement.

## 2015-06-12 NOTE — Assessment & Plan Note (Addendum)
-  Pt reports already receiving flu shot, called his pharmacy and they have no record  -Pt received tdap vaccination today on 06/09/15  -Inquire regarding screening colonoscopy at next visit

## 2015-06-13 NOTE — Progress Notes (Signed)
Internal Medicine Clinic Attending  Case discussed with Dr. Rabbani soon after the resident saw the patient.  We reviewed the resident's history and exam and pertinent patient test results.  I agree with the assessment, diagnosis, and plan of care documented in the resident's note.  

## 2015-06-16 ENCOUNTER — Ambulatory Visit: Payer: No Typology Code available for payment source | Admitting: Cardiovascular Disease

## 2015-07-18 ENCOUNTER — Ambulatory Visit: Payer: BLUE CROSS/BLUE SHIELD | Admitting: Neurology

## 2015-07-20 ENCOUNTER — Other Ambulatory Visit: Payer: Self-pay | Admitting: Cardiovascular Disease

## 2015-07-21 ENCOUNTER — Telehealth: Payer: Self-pay | Admitting: Cardiovascular Disease

## 2015-07-21 NOTE — Telephone Encounter (Signed)
°*  STAT* If patient is at the pharmacy, call can be transferred to refill team.   1. Which medications need to be refilled? (please list name of each medication and dose if known) metoprolol   2. Which pharmacy/location (including street and city if local pharmacy) is medication to be sent to? [facility] [address]   3. Do they need a 30 day or 90 day supply? 90 day   Patient calling the office for samples of medication:   1.  What medication and dosage are you requesting samples for? Metoprolol   2.  Are you currently out of this medication? Yes

## 2015-07-22 MED ORDER — METOPROLOL TARTRATE 25 MG PO TABS: 25.0000 mg | ORAL_TABLET | Freq: Two times a day (BID) | ORAL | Status: DC

## 2015-07-22 NOTE — Telephone Encounter (Signed)
Refill sent for Metoprolol 90 day supply.

## 2015-08-17 ENCOUNTER — Emergency Department
Admission: EM | Admit: 2015-08-17 | Discharge: 2015-08-17 | Disposition: A | Discharge status: Home / Self Care | Payer: BLUE CROSS/BLUE SHIELD | Attending: Emergency Medicine | Admitting: Emergency Medicine

## 2015-08-17 ENCOUNTER — Encounter: Payer: Self-pay | Admitting: *Deleted

## 2015-08-17 DIAGNOSIS — K625 Hemorrhage of anus and rectum: Secondary | ICD-10-CM | POA: Insufficient documentation

## 2015-08-17 DIAGNOSIS — Z5321 Procedure and treatment not carried out due to patient leaving prior to being seen by health care provider: Secondary | ICD-10-CM | POA: Insufficient documentation

## 2015-08-17 LAB — CBC
HEMATOCRIT: 32.6 % — AB (ref 40.0–52.0)
HEMOGLOBIN: 10.7 g/dL — AB (ref 13.0–18.0)
MCH: 27.1 pg (ref 26.0–34.0)
MCHC: 32.8 g/dL (ref 32.0–36.0)
MCV: 82.8 fL (ref 80.0–100.0)
Platelets: 258 10*3/uL (ref 150–440)
RBC: 3.94 MIL/uL — AB (ref 4.40–5.90)
RDW: 18.8 % — ABNORMAL HIGH (ref 11.5–14.5)
WBC: 7.7 10*3/uL (ref 3.8–10.6)

## 2015-08-17 LAB — COMPREHENSIVE METABOLIC PANEL
ALBUMIN: 3 g/dL — AB (ref 3.5–5.0)
ALT: 18 U/L (ref 17–63)
ANION GAP: 9 (ref 5–15)
AST: 27 U/L (ref 15–41)
Alkaline Phosphatase: 93 U/L (ref 38–126)
BILIRUBIN TOTAL: 0.6 mg/dL (ref 0.3–1.2)
BUN: <5 mg/dL — ABNORMAL LOW (ref 6–20)
CALCIUM: 8.4 mg/dL — AB (ref 8.9–10.3)
CO2: 22 mmol/L (ref 22–32)
Chloride: 103 mmol/L (ref 101–111)
Creatinine, Ser: 0.61 mg/dL (ref 0.61–1.24)
GFR calc non Af Amer: >60 mL/min (ref >60)
GLUCOSE: 175 mg/dL — AB (ref 65–99)
POTASSIUM: 4 mmol/L (ref 3.5–5.1)
SODIUM: 134 mmol/L — AB (ref 135–145)
TOTAL PROTEIN: 6.4 g/dL — AB (ref 6.5–8.1)

## 2015-08-17 LAB — ABO/RH: ABO/RH(D): A POS

## 2015-08-17 NOTE — ED Provider Notes (Signed)
Patient left without being seen. I have not personally evaluated this patient or interviewed him.  Carrie Mew, MD 08/17/15 2044

## 2015-08-17 NOTE — ED Notes (Signed)
States this AM he had an episode of loose bloody stool, states 2 episodes today, states he is on ASA and plavix, pt awake and alert

## 2015-08-18 ENCOUNTER — Telehealth: Payer: Self-pay | Admitting: Emergency Medicine

## 2015-08-18 LAB — TYPE AND SCREEN
ABO/RH(D): A POS
ANTIBODY SCREEN: NEGATIVE

## 2015-08-18 NOTE — Addendum Note (Signed)
Addended by: Hulan Fray on: 08/18/2015 05:48 PM   Modules accepted: Orders

## 2015-08-18 NOTE — ED Notes (Signed)
Called patient due to lwot to inquire about condition and follow up plans. Left message.   

## 2015-12-10 DIAGNOSIS — M85872 Other specified disorders of bone density and structure, left ankle and foot: Secondary | ICD-10-CM

## 2015-12-10 DIAGNOSIS — M85871 Other specified disorders of bone density and structure, right ankle and foot: Secondary | ICD-10-CM | POA: Insufficient documentation

## 2015-12-13 ENCOUNTER — Encounter: Payer: Self-pay | Admitting: Cardiovascular Disease

## 2015-12-13 ENCOUNTER — Ambulatory Visit (INDEPENDENT_AMBULATORY_CARE_PROVIDER_SITE_OTHER): Payer: No Typology Code available for payment source | Admitting: Cardiovascular Disease

## 2015-12-13 VITALS — BP 130/62 | Ht 71.0 in | Wt 161.2 lb

## 2015-12-13 DIAGNOSIS — I739 Peripheral vascular disease, unspecified: Secondary | ICD-10-CM

## 2015-12-13 DIAGNOSIS — I2510 Atherosclerotic heart disease of native coronary artery without angina pectoris: Secondary | ICD-10-CM

## 2015-12-13 DIAGNOSIS — E785 Hyperlipidemia, unspecified: Secondary | ICD-10-CM

## 2015-12-13 DIAGNOSIS — Z720 Tobacco use: Secondary | ICD-10-CM

## 2015-12-13 MED ORDER — AMITRIPTYLINE HCL 25 MG PO TABS: 25.0000 mg | ORAL_TABLET | Freq: Every day | ORAL | 5 refills | Status: DC

## 2015-12-13 NOTE — Patient Instructions (Signed)
Medication Instructions:  Your physician has recommended you make the following change in your medication:  START taking amitriptyline '25mg'$  at bedtime   Labwork: none  Testing/Procedures: Your physician has requested that you have a lower extremity arterial exercise duplex and lower extremity doppler. During this test, exercise and ultrasound are used to evaluate arterial blood flow in the legs. Allow one hour for this exam. There are no restrictions or special instructions.    Follow-Up: Your physician wants you to follow-up in: 6 months with Dr. Fletcher Anon.  You will receive a reminder letter in the mail two months in advance. If you don't receive a letter, please call our office to schedule the follow-up appointment.   Any Other Special Instructions Will Be Listed Below (If Applicable).     If you need a refill on your cardiac medications before your next appointment, please call your pharmacy.

## 2015-12-13 NOTE — Progress Notes (Signed)
Cardiology Office Note   Date:  12/13/2015   ID:  Philip [name], DOB 1960/06/30, MRN [id number]  PCP:  Dr. Iona Beard  Cardiologist:   Kathlyn Sacramento, MD   Chief Complaint  Patient presents with  . Other    Pt. c/o dizziness, fatigue, vomiting and stopped the plavix due to bleeding from rectum.       History of Present Illness: Philip [name] is a 55 y.o. male who presents for a follow-up visit regarding coronary artery disease and peripheral arterial disease.  The patient has known history of coronary artery disease. He presented with inferior STEMI 08/24/2014. Cardiac catheterization revealed a complex 90% stenosis proximal LAD, 95% stenosis mid LAD, 70% stenosis D1, and 75% stenosis mid RCA. The patient was transferred to [facility] where he underwent CABG x3, with LIMA to LAD, SVG 2D 1, and RIMA to RCA. He has other medical problems that include tobacco use, hypertension and hyperlipidemia. He was seen by me in December for severe left foot and calf pain at rest. Noninvasive vascular evaluation showed an ABI of 0.44 on the left and normal on the right. There was short occlusion of the left proximal popliteal artery. I proceeded with angiography which showed no significant aortoiliac disease. There was short occlusion of the left proximal popliteal artery with embolization into the dorsalis pedis. I performed successful self-expanding stent placement to the left popliteal artery. Postprocedure ABI was normal with patent stent. His claudication resolved. However, he continued to have severe numbness in his feet with poor balance highly suggestive of peripheral neuropathy. Unfortunately, he continues to smoke. His son died in Oct 13, 2022 and as a result he started smoking more. He had recent rectal bleeding and went to the emergency room but left before evaluation. He followed up with his primary care physician and was found to have a hemoglobin of 7.7. Both aspirin and Plavix were  stopped. He reports no further bleeding and he is supposed to follow-up with GI.  Past Medical History:  Diagnosis Date  . CAD (coronary artery disease)    a. 08/2014 Inf STEMI/CABG x 3 (LIMA->LAD, VG->Diag, RIMA->RCA).  . Hyperlipidemia   . Hypertensive heart disease   . PAD (peripheral artery disease) ([place])    a. 03/2015 Periph Angio: short occlusion of L pop w/ evidence of embolization into the DP->5.0x50 mm Inova self-expanding stent;  b. 04/08/2015 ABI: R 1.12, L 0.99.  . Tobacco abuse     Past Surgical History:  Procedure Laterality Date  . CARDIAC CATHETERIZATION N/A 08/24/2014   Procedure: Left Heart Cath and Coronary Angiography;  Surgeon: Isaias Cowman, MD;  Location: [place];  Service: Cardiovascular;  Laterality: N/A;  . CARDIAC CATHETERIZATION N/A 08/24/2014   Procedure: Coronary Stent Intervention;  Surgeon: Isaias Cowman, MD;  Location: [place] [facility];  Service: Cardiovascular;  Laterality: N/A;  . CORONARY ARTERY BYPASS GRAFT N/A 08/25/2014   Procedure: CORONARY ARTERY BYPASS GRAFTING (CABG), ON PUMP, TIMES THREE, USING BILATERAL MAMMARY ARTERIES, RIGHT GREATER SAPHENOUS VEIN HARVESTED ENDOSCOPICALLY;  Surgeon: Melrose Nakayama, MD;  Location: [facility];  Service: Open Heart Surgery;  Laterality: N/A;  . PERIPHERAL VASCULAR CATHETERIZATION N/A 03/30/2015   Procedure: Abdominal Aortogram w/Lower Extremity;  Surgeon: Wellington Hampshire, MD;  Location: [facility];  Service: Cardiovascular;  Laterality: N/A;  . TEE WITHOUT CARDIOVERSION N/A 08/25/2014   Procedure: TRANSESOPHAGEAL ECHOCARDIOGRAM (TEE);  Surgeon: Melrose Nakayama, MD;  Location: [facility];  Service: Open Heart Surgery;  Laterality: N/A;     Current Outpatient Prescriptions  Medication Sig Dispense Refill  . atorvastatin (LIPITOR) 40 MG tablet Take 1 tablet (40 mg total) by mouth daily. 90 tablet 3  . diphenhydrAMINE (BENADRYL) 25 mg capsule Take 25 mg by mouth 2 (two) times  daily as needed for allergies.     . fluticasone (FLONASE) 50 MCG/ACT nasal spray Place 1 spray into both nostrils daily. (Patient taking differently: Place 1 spray into both nostrils daily as needed for allergies. ) 16 g 2  . gabapentin (NEURONTIN) 600 MG tablet Take 600 mg by mouth 3 (three) times daily.    Marland Kitchen lisinopril (PRINIVIL,ZESTRIL) 5 MG tablet Take 1 tablet (5 mg total) by mouth daily. 90 tablet 3  . metoprolol tartrate (LOPRESSOR) 25 MG tablet Take 1 tablet (25 mg total) by mouth 2 (two) times daily. 180 tablet 3  . omeprazole (PRILOSEC OTC) 20 MG tablet Take 20 mg by mouth daily.     No current facility-administered medications for this visit.     Allergies:   Other; Contrast media [iodinated diagnostic agents]; Nsaids; and Ibuprofen    Social History:  The patient  reports that he has been smoking Cigarettes.  He has been smoking about 0.20 packs per day. He has never used smokeless tobacco. He reports that he drinks alcohol. He reports that he does not use drugs.   Family History:  The patient's family history includes Breast cancer in his sister; Heart attack in his brother, brother, and sister; Heart attack (age of onset: 85) in his mother; Hyperlipidemia in his mother; Hypertension in his father.    ROS:  Please see the history of present illness.   Otherwise, review of systems are positive for none.   All other systems are reviewed and negative.    PHYSICAL EXAM: VS:  BP 130/62 (BP Location: Left Arm, Patient Position: Sitting, Cuff Size: Normal)   Ht '5\' 11"'$  (1.803 m)   Wt 161 lb 4 oz (73.1 kg)   BMI 22.49 kg/m  , BMI Body mass index is 22.49 kg/m. GEN: Well nourished, well developed, in no acute distress  HEENT: normal  Neck: no JVD, carotid bruits, or masses Cardiac: RRR; no murmurs, rubs, or gallops,no edema  Respiratory:  clear to auscultation bilaterally, normal work of breathing GI: soft, nontender, nondistended, + BS MS: no deformity or atrophy  Skin: warm  and dry, no rash Neuro:  Strength and sensation are intact Psych: euthymic mood, full affect Vascular: Posterior tibial is +2 bilaterally. Dorsalis pedis is +2 on the right and +1 on the left  EKG:  EKG is ordered today. The ekg ordered today demonstrates normal sinus rhythm with no significant ST or T wave changes.   Recent Labs: 06/09/2015: TSH 0.756 08/17/2015: ALT 18; BUN <5; Creatinine, Ser 0.61; Hemoglobin 10.7; Platelets 258; Potassium 4.0; Sodium 134    Lipid Panel No results found for: CHOL, TRIG, HDL, CHOLHDL, VLDL, LDLCALC, LDLDIRECT    Wt Readings from Last 3 Encounters:  12/13/15 161 lb 4 oz (73.1 kg)  08/17/15 160 lb (72.6 kg)  06/09/15 161 lb 6.4 oz (73.2 kg)       No flowsheet data found.    ASSESSMENT AND PLAN:  1.  Peripheral arterial disease: Status post left popliteal artery self-expanding stent placement in December 2016. His distal pulses are palpable. He is due for a follow-up duplex to check for restenosis. I agree with holding aspirin and Plavix for now due to his recent  GI bleeding and anemia.  2. Peripheral neuropathy: The patient seems to have severe peripheral neuropathy of unclear etiology. He reports workup by podiatry and some other specialists. His symptoms are worse at night when he is trying to sleep. No significant relief with gabapentin. Thus, I added small dose amitriptyline at bedtime.  3. Coronary artery disease involving native coronary arteries without angina: Continue medical therapy.  4. Hyperlipidemia: Continue treatment with atorvastatin.  5. GI bleed: No active bleeding currently. I advised him to follow-up with GI for further evaluation.   Disposition:   FU with me in 6 months  Signed,  Kathlyn Sacramento, MD  12/13/2015 3:07 PM    [facility]

## 2015-12-23 ENCOUNTER — Telehealth: Payer: Self-pay | Admitting: Cardiovascular Disease

## 2015-12-23 NOTE — Telephone Encounter (Signed)
Called patient to move up LE ART and ART SEG  Pt states he went to ED at [facility] in [place] and they did all these ulrta sounds there  He is not at [facility] (they sent him there Tuesday night) Would like [place] to see in CE and if Dr Fletcher Anon had any concerns or questions to please call him back.

## 2015-12-27 NOTE — Telephone Encounter (Signed)
Per Dr. Fletcher Anon: "Ok no need to repeat the studies. "  Left detailed message on pt VM. Provided CB number if questions.  Orders deleted.

## 2016-01-02 DIAGNOSIS — I96 Gangrene, not elsewhere classified: Secondary | ICD-10-CM | POA: Insufficient documentation

## 2016-01-08 DIAGNOSIS — D509 Iron deficiency anemia, unspecified: Secondary | ICD-10-CM | POA: Insufficient documentation

## 2016-01-08 NOTE — Progress Notes (Deleted)
[name]  [phone] [phone]  ID: Philip [name] OB: [birth year]  MR#: [id number]  WCB#:[id number]  Patient Care Team: Maryellen Pile, MD as PCP - General  CHIEF COMPLAINT: Iron deficiency anemia  INTERVAL HISTORY: ***  REVIEW OF SYSTEMS:   ROS  As per HPI. Otherwise, a complete review of systems is negative.  PAST MEDICAL HISTORY: Past Medical History:  Diagnosis Date  . CAD (coronary artery disease)    a. 08/2014 Inf STEMI/CABG x 3 (LIMA->LAD, VG->Diag, RIMA->RCA).  . Hyperlipidemia   . Hypertensive heart disease   . PAD (peripheral artery disease) ([place])    a. 03/2015 Periph Angio: short occlusion of L pop w/ evidence of embolization into the DP->5.0x50 mm Inova self-expanding stent;  b. 04/08/2015 ABI: R 1.12, L 0.99.  . Tobacco abuse     PAST SURGICAL HISTORY: Past Surgical History:  Procedure Laterality Date  . CARDIAC CATHETERIZATION N/A 08/24/2014   Procedure: Left Heart Cath and Coronary Angiography;  Surgeon: Isaias Cowman, MD;  Location: [place] [facility];  Service: Cardiovascular;  Laterality: N/A;  . CARDIAC CATHETERIZATION N/A 08/24/2014   Procedure: Coronary Stent Intervention;  Surgeon: Isaias Cowman, MD;  Location: [place] [facility];  Service: Cardiovascular;  Laterality: N/A;  . CORONARY ARTERY BYPASS GRAFT N/A 08/25/2014   Procedure: CORONARY ARTERY BYPASS GRAFTING (CABG), ON PUMP, TIMES THREE, USING BILATERAL MAMMARY ARTERIES, RIGHT GREATER SAPHENOUS VEIN HARVESTED ENDOSCOPICALLY;  Surgeon: Melrose Nakayama, MD;  Location: [place];  Service: Open Heart Surgery;  Laterality: N/A;  . PERIPHERAL VASCULAR CATHETERIZATION N/A 03/30/2015   Procedure: Abdominal Aortogram w/Lower Extremity;  Surgeon: Wellington Hampshire, MD;  Location: [place] [facility];  Service: Cardiovascular;  Laterality: N/A;  . TEE WITHOUT CARDIOVERSION N/A 08/25/2014   Procedure: TRANSESOPHAGEAL ECHOCARDIOGRAM (TEE);  Surgeon:  Melrose Nakayama, MD;  Location: [place];  Service: Open Heart Surgery;  Laterality: N/A;    FAMILY HISTORY: Family History  Problem Relation Age of Onset  . Hypertension Father   . Heart attack Mother 64    STENT  . Hyperlipidemia Mother   . Heart attack Brother   . Heart attack Brother   . Breast cancer Sister   . Heart attack Sister     ADVANCED DIRECTIVES (Y/N):  N  HEALTH MAINTENANCE: Social History  Substance Use Topics  . Smoking status: Current Every Day Smoker    Packs/day: 0.20    Types: Cigarettes  . Smokeless tobacco: Never Used     Comment: 4cigs day down from 2ppd  . Alcohol use 0.0 oz/week     Comment: Sometimes on w/e.     Colonoscopy:  PAP:  Bone density:  Lipid panel:  Allergies  Allergen Reactions  . Other Swelling    Other reaction(s): Other (See Comments) Pt states that he got "knots behind his ears".  . Contrast Media [Iodinated Diagnostic Agents] Other (See Comments)    Pt states that he got "knots behind his ears".  . Nsaids Other (See Comments)    Other reaction(s): Other (See Comments) Reaction:  Blood in stool  Reaction:  Blood in stool   . Ibuprofen Other (See Comments) and Nausea Only    Blood in stools    Current Outpatient Prescriptions  Medication Sig Dispense Refill  . amitriptyline (ELAVIL) 25 MG tablet Take 1 tablet (25 mg total) by mouth at bedtime. 30 tablet 5  . atorvastatin (LIPITOR) 40 MG tablet Take 1 tablet (40 mg total) by mouth daily. [place]  tablet 3  . diphenhydrAMINE (BENADRYL) 25 mg capsule Take 25 mg by mouth 2 (two) times daily as needed for allergies.     . fluticasone (FLONASE) 50 MCG/ACT nasal spray Place 1 spray into both nostrils daily. (Patient taking differently: Place 1 spray into both nostrils daily as needed for allergies. ) 16 g 2  . gabapentin (NEURONTIN) 600 MG tablet Take 600 mg by mouth 3 (three) times daily.    Marland Kitchen lisinopril (PRINIVIL,ZESTRIL) 5 MG tablet Take 1 tablet (5 mg total) by mouth daily.  90 tablet 3  . metoprolol tartrate (LOPRESSOR) 25 MG tablet Take 1 tablet (25 mg total) by mouth 2 (two) times daily. 180 tablet 3  . omeprazole (PRILOSEC OTC) 20 MG tablet Take 20 mg by mouth daily.     No current facility-administered medications for this visit.     OBJECTIVE: There were no vitals filed for this visit.   There is no height or weight on file to calculate BMI.    ECOG FS:{CHL ONC [id number]  General: Well-developed, well-nourished, no acute distress. Eyes: Pink conjunctiva, anicteric sclera. HEENT: Normocephalic, moist mucous membranes, clear oropharnyx. Lungs: Clear to auscultation bilaterally. Heart: Regular rate and rhythm. No rubs, murmurs, or gallops. Abdomen: Soft, nontender, nondistended. No organomegaly noted, normoactive bowel sounds. Musculoskeletal: No edema, cyanosis, or clubbing. Neuro: Alert, answering all questions appropriately. Cranial nerves grossly intact. Skin: No rashes or petechiae noted. Psych: Normal affect. Lymphatics: No cervical, calvicular, axillary or inguinal LAD.   LAB RESULTS:  Lab Results  Component Value Date   NA 134 (L) 08/17/2015   K 4.0 08/17/2015   CL 103 08/17/2015   CO2 22 08/17/2015   GLUCOSE 175 (H) 08/17/2015   BUN <5 (L) 08/17/2015   CREATININE 0.61 08/17/2015   CALCIUM 8.4 (L) 08/17/2015   PROT 6.4 (L) 08/17/2015   ALBUMIN 3.0 (L) 08/17/2015   AST 27 08/17/2015   ALT 18 08/17/2015   ALKPHOS 93 08/17/2015   BILITOT 0.6 08/17/2015   GFRNONAA >60 08/17/2015   GFRAA >60 08/17/2015    Lab Results  Component Value Date   WBC 7.7 08/17/2015   NEUTROABS 2.5 06/09/2015   HGB 10.7 (L) 08/17/2015   HCT 32.6 (L) 08/17/2015   MCV 82.8 08/17/2015   PLT 258 08/17/2015     STUDIES: No results found.  ASSESSMENT: Iron deficiency anemia  PLAN:    1. Iron deficiency anemia:  Patient expressed understanding and was in agreement with this plan. He also understands that He can call clinic at any time with  any questions, concerns, or complaints.   No matching staging information was found for the patient.  Lloyd Huger, MD   01/08/2016 10:56 PM

## 2016-01-09 ENCOUNTER — Inpatient Hospital Stay: Payer: No Typology Code available for payment source | Admitting: Oncology

## 2016-01-25 ENCOUNTER — Encounter: Payer: BLUE CROSS/BLUE SHIELD | Admitting: Internal Medicine

## 2016-01-31 ENCOUNTER — Inpatient Hospital Stay: Payer: No Typology Code available for payment source | Admitting: Oncology

## 2016-03-21 DIAGNOSIS — M792 Neuralgia and neuritis, unspecified: Secondary | ICD-10-CM | POA: Insufficient documentation

## 2016-03-21 DIAGNOSIS — M79672 Pain in left foot: Secondary | ICD-10-CM

## 2016-03-21 DIAGNOSIS — G8929 Other chronic pain: Secondary | ICD-10-CM | POA: Insufficient documentation

## 2016-03-21 DIAGNOSIS — G894 Chronic pain syndrome: Secondary | ICD-10-CM | POA: Insufficient documentation

## 2016-05-07 ENCOUNTER — Other Ambulatory Visit: Payer: Self-pay | Admitting: Cardiovascular Disease

## 2016-05-07 NOTE — Telephone Encounter (Signed)
Pt requesting refill. Last ov with Arida 12/13/15 states he is holding plavix for now due to GI bleed. Please advise if ok to restart. Thanks!

## 2016-05-08 ENCOUNTER — Telehealth: Payer: Self-pay | Admitting: Cardiovascular Disease

## 2016-05-08 NOTE — Telephone Encounter (Signed)
Pharmacy requested refill on plavix.  Pt was instructed to hold both plavix and aspirin in August d/t GI bleed.  S/w pt today who reports he was cleared months ago by GI to resume plavix. He has not been taking aspirin. 03/2015 abdominal aortogram, stent placed in left popliteal artery w/ dual antiplatelet therapy for at least one month. Plavix resumed Sept 2017 @ [facility]. He is scheduled for 1/26 colonoscopy but plans to cancel d/t insurance change. Will confirm w/Dr. Fletcher Anon that pt should be taking as plavix '75mg'$  is no longer on his medication list.

## 2016-05-09 NOTE — Telephone Encounter (Signed)
Ok to continue Plavix without Aspirin as long as no signs of bleeding.

## 2016-05-09 NOTE — Telephone Encounter (Signed)
S/w pt who verbalized understanding that he may take plavix as long as no signs of bleeding. Prescription refill submitted to [facility].

## 2016-05-17 ENCOUNTER — Encounter: Payer: Self-pay | Admitting: Emergency Medicine

## 2016-05-17 ENCOUNTER — Emergency Department
Admission: EM | Admit: 2016-05-17 | Discharge: 2016-05-17 | Disposition: A | Discharge status: Home / Self Care | Payer: Medicaid Other | Attending: Emergency Medicine | Admitting: Emergency Medicine

## 2016-05-17 DIAGNOSIS — I2510 Atherosclerotic heart disease of native coronary artery without angina pectoris: Secondary | ICD-10-CM | POA: Insufficient documentation

## 2016-05-17 DIAGNOSIS — J0100 Acute maxillary sinusitis, unspecified: Secondary | ICD-10-CM | POA: Diagnosis not present

## 2016-05-17 DIAGNOSIS — F17210 Nicotine dependence, cigarettes, uncomplicated: Secondary | ICD-10-CM | POA: Insufficient documentation

## 2016-05-17 DIAGNOSIS — R220 Localized swelling, mass and lump, head: Secondary | ICD-10-CM | POA: Insufficient documentation

## 2016-05-17 DIAGNOSIS — R05 Cough: Secondary | ICD-10-CM | POA: Diagnosis present

## 2016-05-17 DIAGNOSIS — F17200 Nicotine dependence, unspecified, uncomplicated: Secondary | ICD-10-CM

## 2016-05-17 LAB — CBC WITH DIFFERENTIAL/PLATELET
Basophils Absolute: 0 10*3/uL (ref 0–0.1)
Basophils Relative: 1 %
Eosinophils Absolute: 0 10*3/uL (ref 0–0.7)
Eosinophils Relative: 1 %
HEMATOCRIT: 41.7 % (ref 40.0–52.0)
HEMOGLOBIN: 14.5 g/dL (ref 13.0–18.0)
LYMPHS ABS: 0.8 10*3/uL — AB (ref 1.0–3.6)
Lymphocytes Relative: 12 %
MCH: 32.1 pg (ref 26.0–34.0)
MCHC: 34.9 g/dL (ref 32.0–36.0)
MCV: 92.1 fL (ref 80.0–100.0)
MONO ABS: 0.7 10*3/uL (ref 0.2–1.0)
MONOS PCT: 10 %
NEUTROS ABS: 5.4 10*3/uL (ref 1.4–6.5)
NEUTROS PCT: 78 %
Platelets: 208 10*3/uL (ref 150–440)
RBC: 4.53 MIL/uL (ref 4.40–5.90)
RDW: 16.9 % — ABNORMAL HIGH (ref 11.5–14.5)
WBC: 6.9 10*3/uL (ref 3.8–10.6)

## 2016-05-17 LAB — BASIC METABOLIC PANEL
Anion gap: 8 (ref 5–15)
BUN: 7 mg/dL (ref 6–20)
CHLORIDE: 103 mmol/L (ref 101–111)
CO2: 22 mmol/L (ref 22–32)
CREATININE: 0.67 mg/dL (ref 0.61–1.24)
Calcium: 8.6 mg/dL — ABNORMAL LOW (ref 8.9–10.3)
GFR calc Af Amer: >60 mL/min (ref >60)
GFR calc non Af Amer: >60 mL/min (ref >60)
GLUCOSE: 116 mg/dL — AB (ref 65–99)
Potassium: 3.7 mmol/L (ref 3.5–5.1)
Sodium: 133 mmol/L — ABNORMAL LOW (ref 135–145)

## 2016-05-17 MED ORDER — OXYCODONE-ACETAMINOPHEN 5-325 MG PO TABS: 1.0000 | ORAL_TABLET | ORAL | 0 refills | Status: DC | PRN

## 2016-05-17 MED ORDER — OXYCODONE-ACETAMINOPHEN 5-325 MG PO TABS
1.0000 | ORAL_TABLET | Freq: Once | ORAL | Status: AC
  Administered 2016-05-17: 1 via ORAL
  Filled 2016-05-17: qty 1

## 2016-05-17 MED ORDER — CLINDAMYCIN HCL 150 MG PO CAPS: ORAL_CAPSULE | ORAL | 0 refills | Status: DC

## 2016-05-17 MED ORDER — CLINDAMYCIN PHOSPHATE 600 MG/50ML IV SOLN
600.0000 mg | Freq: Once | INTRAVENOUS | Status: AC
  Administered 2016-05-17: 600 mg via INTRAVENOUS
  Filled 2016-05-17: qty 50

## 2016-05-17 MED ORDER — PREDNISONE 10 MG PO TABS: ORAL_TABLET | ORAL | 0 refills | Status: DC

## 2016-05-17 NOTE — ED Notes (Signed)
See triage note  Developed cold sx's about 1 week ago  Had some runny nose and sneezing   Then noticed swelling to right right of face yesterday  Worse this am

## 2016-05-17 NOTE — ED Triage Notes (Signed)
C/O sneezing, runny nose x 1 week.  Noticed a sore at base of right nostril, nose swelling and swelling under right eye this morning.

## 2016-05-17 NOTE — ED Provider Notes (Signed)
[facility] Emergency Department Provider Note   ____________________________________________   First MD Initiated Contact with Patient 05/17/16 1036     (approximate)  I have reviewed the triage vital signs and the nursing notes.   HISTORY  Chief Complaint Facial Swelling   HPI TRAN Philip [name] is a 56 y.o. male is here complaining of sneezing, runny nose and cough for 1 week. Patient states that on the right side of his nose he has noticed some soreness and swelling. This morning when patient woke up he had right-sided facial swelling. He is not aware of any fever and states that his wife actually took his temperature last night and he had no elevated temperature. This morning he feels worse than yesterday. He has facial pain. He has not been taking any over-the-counter medication for his congestion. Patient continues to smoke and has been for approximately 40 years. He states he has had sinus infections in the past but never this painful. He denies any visual changes, headache, nausea or vomiting.He rates his pain as an 8/10.   Past Medical History:  Diagnosis Date  . CAD (coronary artery disease)    a. 08/2014 Inf STEMI/CABG x 3 (LIMA->LAD, VG->Diag, RIMA->RCA).  . Hyperlipidemia   . Hypertensive heart disease   . PAD (peripheral artery disease) ([place])    a. 03/2015 Periph Angio: short occlusion of L pop w/ evidence of embolization into the DP->5.0x50 mm Inova self-expanding stent;  b. 04/08/2015 ABI: R 1.12, L 0.99.  . Tobacco abuse     Patient Active Problem List   Diagnosis Date Noted  . Iron deficiency anemia 01/08/2016  . Peripheral neuropathy ([place]) 06/12/2015  . PAD (peripheral artery disease) ([place]) 03/29/2015  . Essential (primary) hypertension 10/05/2014  . Hypercholesterolemia without hypertriglyceridemia 10/05/2014  . S/P CABG x 3 08/25/2014  . H/O coronary artery bypass surgery 08/25/2014  . History of MI (myocardial infarction)  08/24/2014  . CAD (coronary artery disease) 08/24/2014  . Right ankle pain 07/20/2014  . Arthralgia of ankle or foot 07/20/2014  . Tobacco abuse 07/06/2014  . Seizures ([place]) 07/06/2014  . Preventative health care 07/06/2014  . Extremity pain 07/06/2014    Past Surgical History:  Procedure Laterality Date  . CARDIAC CATHETERIZATION N/A 08/24/2014   Procedure: Left Heart Cath and Coronary Angiography;  Surgeon: Isaias Cowman, MD;  Location: [place] [facility];  Service: Cardiovascular;  Laterality: N/A;  . CARDIAC CATHETERIZATION N/A 08/24/2014   Procedure: Coronary Stent Intervention;  Surgeon: Isaias Cowman, MD;  Location: [place] [facility];  Service: Cardiovascular;  Laterality: N/A;  . CORONARY ARTERY BYPASS GRAFT N/A 08/25/2014   Procedure: CORONARY ARTERY BYPASS GRAFTING (CABG), ON PUMP, TIMES THREE, USING BILATERAL MAMMARY ARTERIES, RIGHT GREATER SAPHENOUS VEIN HARVESTED ENDOSCOPICALLY;  Surgeon: Melrose Nakayama, MD;  Location: [place];  Service: Open Heart Surgery;  Laterality: N/A;  . PERIPHERAL VASCULAR CATHETERIZATION N/A 03/30/2015   Procedure: Abdominal Aortogram w/Lower Extremity;  Surgeon: Wellington Hampshire, MD;  Location: [place] [facility];  Service: Cardiovascular;  Laterality: N/A;  . TEE WITHOUT CARDIOVERSION N/A 08/25/2014   Procedure: TRANSESOPHAGEAL ECHOCARDIOGRAM (TEE);  Surgeon: Melrose Nakayama, MD;  Location: [place];  Service: Open Heart Surgery;  Laterality: N/A;    Prior to Admission medications   Medication Sig Start Date End Date Taking? Authorizing Provider  rivaroxaban (XARELTO) 10 MG TABS tablet Take 10 mg by mouth daily.   Yes Historical Provider, MD  atorvastatin (LIPITOR) 40 MG tablet Take 1  tablet (40 mg total) by mouth daily. 05/19/15   Rogelia Mire, NP  clindamycin (CLEOCIN) 150 MG capsule Take 2 tablets qid until finished 05/17/16   Johnn Hai, PA-C  diphenhydrAMINE (BENADRYL) 25 mg capsule Take 25 mg by mouth 2 (two)  times daily as needed for allergies.     Historical Provider, MD  fluticasone (FLONASE) 50 MCG/ACT nasal spray Place 1 spray into both nostrils daily. Patient taking differently: Place 1 spray into both nostrils daily as needed for allergies.  09/14/14 12/13/15  Tasrif Ahmed, MD  gabapentin (NEURONTIN) 600 MG tablet Take 600 mg by mouth 3 (three) times daily.    Historical Provider, MD  lisinopril (PRINIVIL,ZESTRIL) 5 MG tablet Take 1 tablet (5 mg total) by mouth daily. 05/19/15   Rogelia Mire, NP  metoprolol tartrate (LOPRESSOR) 25 MG tablet Take 1 tablet (25 mg total) by mouth 2 (two) times daily. 07/22/15   Minna Merritts, MD  oxyCODONE-acetaminophen (PERCOCET) 5-325 MG tablet Take 1 tablet by mouth every 4 (four) hours as needed for severe pain. 05/17/16   Johnn Hai, PA-C  predniSONE (DELTASONE) 10 MG tablet Take 6 tablets  today, on day 2 take 5 tablets, day 3 take 4 tablets, day 4 take 3 tablets, day 5 take  2 tablets and 1 tablet the last day 05/17/16   Johnn Hai, PA-C    Allergies Other; Contrast media [iodinated diagnostic agents]; Nsaids; and Ibuprofen  Family History  Problem Relation Age of Onset  . Hypertension Father   . Heart attack Mother 48    STENT  . Hyperlipidemia Mother   . Heart attack Brother   . Heart attack Brother   . Breast cancer Sister   . Heart attack Sister     Social History Social History  Substance Use Topics  . Smoking status: Current Every Day Smoker    Packs/day: 0.20    Types: Cigarettes  . Smokeless tobacco: Never Used     Comment: 4cigs day down from 2ppd  . Alcohol use 0.0 oz/week     Comment: Sometimes on w/e.    Review of Systems Constitutional: No fever/chills Eyes: No visual changes. ENT: No sore throat. Positive right naris was soreness Cardiovascular: Denies chest pain. Respiratory: Denies shortness of breath. Gastrointestinal: No abdominal pain.  No nausea, no vomiting.  No diarrhea.  No  constipation. Genitourinary: Negative for dysuria. Musculoskeletal: Negative for back pain. Skin: Negative for rash. Neurological: Negative for headaches, focal weakness or numbness.  10-point ROS otherwise negative.  ____________________________________________   PHYSICAL EXAM:  VITAL SIGNS: ED Triage Vitals  Enc Vitals Group     BP 05/17/16 1019 (!) 148/97     Pulse --      Resp 05/17/16 1019 16     Temp 05/17/16 1019 97.6 F (36.4 C)     Temp Source 05/17/16 1019 Oral     SpO2 05/17/16 1019 99 %     Weight 05/17/16 1020 165 lb (74.8 kg)     Height 05/17/16 1020 '5\' 11"'$  (1.803 m)     Head Circumference --      Peak Flow --      Pain Score 05/17/16 1021 8     Pain Loc --      Pain Edu? --      Excl. in [place]? --     Constitutional: Alert and oriented. Well appearing and in no acute distress. Eyes: Conjunctivae are normal. PERRL. EOMI. Head: Atraumatic.Moderate tenderness on  percussion of the right maxillary sinus. Nose:Positive for congestion negative for rhinorrhea. There is moderate tenderness on palpation of the right nares.  There is some minimal erythema to the right naris that extends to the right anterior facial area infraorbital area.   Mouth/Throat: Mucous membranes are moist.  Oropharynx non-erythematous. Neck: No stridor.   Hematological/Lymphatic/Immunilogical: No cervical lymphadenopathy. Cardiovascular: Normal rate, regular rhythm. Grossly normal heart sounds.  Good peripheral circulation. Respiratory: Normal respiratory effort.  No retractions. Lungs CTAB. Musculoskeletal: Moves upper and lower extremities without difficulty. Normal gait was noted. Neurologic:  Normal speech and language. No gross focal neurologic deficits are appreciated. No gait instability. Skin:  Skin is warm, dry and intact. There is erythema of the right infraorbital area without any localized abscess formation. There is no induration. No drainage. Psychiatric: Mood and affect are  normal. Speech and behavior are normal.  ____________________________________________   LABS (all labs ordered are listed, but only abnormal results are displayed)  Labs Reviewed  CBC WITH DIFFERENTIAL/PLATELET - Abnormal; Notable for the following:       Result Value   RDW 16.9 (*)    Lymphs Abs 0.8 (*)    All other components within normal limits  BASIC METABOLIC PANEL - Abnormal; Notable for the following:    Sodium 133 (*)    Glucose, Bld 116 (*)    Calcium 8.6 (*)    All other components within normal limits     PROCEDURES  Procedure(s) performed: None  Procedures  Critical Care performed: No  ____________________________________________   INITIAL IMPRESSION / ASSESSMENT AND PLAN / ED COURSE  Pertinent labs & imaging results that were available during my care of the patient were reviewed by me and considered in my medical decision making (see chart for details).  Patient was reassured that white count was normal. Patient received clindamycin 600 mg IV while in the emergency room. He was also given a prescription for Percocet as needed for pain, clindamycin 300 mg 4 times a day and prednisone 60 mg taper. Patient has a prescription at home for Flonase nasal spray that he will continue taking. Patient will follow-up with  ENT if any continued problems or return to the emergency room if any severe worsening of his symptoms. Most likely this is more sinus related then a periorbital cellulitis. Patient remained afebrile while in the emergency room.      ____________________________________________   FINAL CLINICAL IMPRESSION(S) / ED DIAGNOSES  Final diagnoses:  Acute maxillary sinusitis, recurrence not specified  Current every day smoker  Right facial swelling      NEW MEDICATIONS STARTED DURING THIS VISIT:  Discharge Medication List as of 05/17/2016  1:24 PM    START taking these medications   Details  clindamycin (CLEOCIN) 150 MG capsule Take 2  tablets qid until finished, Print    oxyCODONE-acetaminophen (PERCOCET) 5-325 MG tablet Take 1 tablet by mouth every 4 (four) hours as needed for severe pain., Starting Thu 05/17/2016, Print    predniSONE (DELTASONE) 10 MG tablet Take 6 tablets  today, on day 2 take 5 tablets, day 3 take 4 tablets, day 4 take 3 tablets, day 5 take  2 tablets and 1 tablet the last day, Print         Note:  This document was prepared using Dragon voice recognition software and may include unintentional dictation errors.    Johnn Hai, PA-C 05/17/16 1741    Harvest Dark, MD 05/18/16 2036

## 2016-05-17 NOTE — Discharge Instructions (Signed)
Continue using Flonase nasal spray 2 sprays to each side of your nose once daily. Clindamycin 2 tablets 4 times a day until finished. Prednisone and day 60 mg and tapered down. Percocet as needed for pain. Follow-up with your primary care physician if any continued problems. You may need to see a ear nose and throat specialist if not improving.

## 2016-06-11 ENCOUNTER — Other Ambulatory Visit: Payer: Self-pay | Admitting: Nurse Practitioner

## 2016-06-28 ENCOUNTER — Encounter: Payer: Self-pay | Admitting: Emergency Medicine

## 2016-06-28 ENCOUNTER — Emergency Department: Payer: Medicaid Other

## 2016-06-28 ENCOUNTER — Emergency Department
Admission: EM | Admit: 2016-06-28 | Discharge: 2016-06-28 | Disposition: A | Discharge status: Home / Self Care | Payer: Medicaid Other | Attending: Emergency Medicine | Admitting: Emergency Medicine

## 2016-06-28 DIAGNOSIS — R0789 Other chest pain: Secondary | ICD-10-CM | POA: Diagnosis not present

## 2016-06-28 DIAGNOSIS — R591 Generalized enlarged lymph nodes: Secondary | ICD-10-CM

## 2016-06-28 DIAGNOSIS — I2510 Atherosclerotic heart disease of native coronary artery without angina pectoris: Secondary | ICD-10-CM | POA: Insufficient documentation

## 2016-06-28 DIAGNOSIS — E871 Hypo-osmolality and hyponatremia: Secondary | ICD-10-CM | POA: Insufficient documentation

## 2016-06-28 DIAGNOSIS — R079 Chest pain, unspecified: Secondary | ICD-10-CM

## 2016-06-28 DIAGNOSIS — Z79899 Other long term (current) drug therapy: Secondary | ICD-10-CM | POA: Diagnosis not present

## 2016-06-28 DIAGNOSIS — L03114 Cellulitis of left upper limb: Secondary | ICD-10-CM

## 2016-06-28 DIAGNOSIS — L089 Local infection of the skin and subcutaneous tissue, unspecified: Secondary | ICD-10-CM | POA: Diagnosis present

## 2016-06-28 DIAGNOSIS — F17210 Nicotine dependence, cigarettes, uncomplicated: Secondary | ICD-10-CM | POA: Diagnosis not present

## 2016-06-28 DIAGNOSIS — I10 Essential (primary) hypertension: Secondary | ICD-10-CM | POA: Diagnosis not present

## 2016-06-28 DIAGNOSIS — R590 Localized enlarged lymph nodes: Secondary | ICD-10-CM | POA: Insufficient documentation

## 2016-06-28 LAB — BASIC METABOLIC PANEL
ANION GAP: 8 (ref 5–15)
CHLORIDE: 101 mmol/L (ref 101–111)
CO2: 23 mmol/L (ref 22–32)
Calcium: 8.6 mg/dL — ABNORMAL LOW (ref 8.9–10.3)
Creatinine, Ser: 0.63 mg/dL (ref 0.61–1.24)
Glucose, Bld: 137 mg/dL — ABNORMAL HIGH (ref 65–99)
Potassium: 3.7 mmol/L (ref 3.5–5.1)
SODIUM: 132 mmol/L — AB (ref 135–145)

## 2016-06-28 LAB — TROPONIN I: Troponin I: <0.03 ng/mL (ref <0.03)

## 2016-06-28 LAB — CBC WITH DIFFERENTIAL/PLATELET
BASOS ABS: 0 10*3/uL (ref 0–0.1)
BASOS PCT: 1 %
Eosinophils Absolute: 0 10*3/uL (ref 0–0.7)
Eosinophils Relative: 1 %
HCT: 39.7 % — ABNORMAL LOW (ref 40.0–52.0)
Hemoglobin: 14.3 g/dL (ref 13.0–18.0)
Lymphocytes Relative: 19 %
Lymphs Abs: 0.8 10*3/uL — ABNORMAL LOW (ref 1.0–3.6)
MCH: 34 pg (ref 26.0–34.0)
MCHC: 35.9 g/dL (ref 32.0–36.0)
MCV: 94.6 fL (ref 80.0–100.0)
MONO ABS: 0.4 10*3/uL (ref 0.2–1.0)
Monocytes Relative: 9 %
NEUTROS ABS: 3.1 10*3/uL (ref 1.4–6.5)
NEUTROS PCT: 70 %
PLATELETS: 184 10*3/uL (ref 150–440)
RBC: 4.2 MIL/uL — AB (ref 4.40–5.90)
RDW: 14.1 % (ref 11.5–14.5)
WBC: 4.4 10*3/uL (ref 3.8–10.6)

## 2016-06-28 LAB — LACTATE DEHYDROGENASE: LDH: 132 U/L (ref 98–192)

## 2016-06-28 LAB — URIC ACID: Uric Acid, Serum: 4.2 mg/dL — ABNORMAL LOW (ref 4.4–7.6)

## 2016-06-28 MED ORDER — CLINDAMYCIN HCL 300 MG PO CAPS: 300.0000 mg | ORAL_CAPSULE | Freq: Three times a day (TID) | ORAL | 0 refills | Status: DC

## 2016-06-28 MED ORDER — CLINDAMYCIN HCL 150 MG PO CAPS
300.0000 mg | ORAL_CAPSULE | Freq: Once | ORAL | Status: AC
  Administered 2016-06-28: 300 mg via ORAL
  Filled 2016-06-28: qty 2

## 2016-06-28 MED ORDER — HYDROCODONE-ACETAMINOPHEN 5-325 MG PO TABS
1.0000 | ORAL_TABLET | Freq: Once | ORAL | Status: AC
  Administered 2016-06-28: 1 via ORAL
  Filled 2016-06-28: qty 1

## 2016-06-28 NOTE — ED Triage Notes (Signed)
Pt reports abscess to left wrist that has increased in redness and swelling over the past two days. Pt also reports generalized chest pain and cough for several days.

## 2016-06-28 NOTE — ED Notes (Signed)
Pt resting in bed, wife at bedside, pt watching TV, pt in no acute distress

## 2016-06-28 NOTE — ED Notes (Signed)
E-signature box not working. Pt verbalized understanding of discharge instructions and denied questions.

## 2016-06-28 NOTE — ED Notes (Signed)
States he noticed a "little abscess" on his left wrist Monday, states he tried to drain it and since his wrist has been red and swollen, also states cough and scratchy throat for 1 week, clear sputum, states chest tightness, awake and alert in no acute distress

## 2016-06-28 NOTE — ED Notes (Signed)
Dr. Lord at bedside.  

## 2016-06-28 NOTE — ED Provider Notes (Signed)
[facility] Emergency Department Provider Note ____________________________________________   I have reviewed the triage vital signs and the triage nursing note.  HISTORY  Chief Complaint Abscess and Chest Pain   Historian Patient and wife  HPI Philip [name] is a 56 y.o. male with history of coronary artery disease, 3 vessel CABG, present in today mostly because he wanted evaluation for skin infection on the left wrist. Patient states he noticed after work yesterday that he had a small bump that looked like a pimple. His wife did try to open it. This morning the swelling and redness are a lot worse. No fever or chills.  Patient also reports that he's been having some mild central chest discomfort that he thinks his tightness due to mild cough and congestion. The cough and congestion has been there a couple of weeks, and he's had some chest discomfort last night and this morning. He does have history of bypass. His cardiologist is Dr. Fletcher Anon. Chest discomfort is mild. Been ongoing since last night.  States he would not have come to the ED for chest discomfort, he came because of the infection on his left forearm/wrist.    Past Medical History:  Diagnosis Date  . CAD (coronary artery disease)    a. 08/2014 Inf STEMI/CABG x 3 (LIMA->LAD, VG->Diag, RIMA->RCA).  . Hyperlipidemia   . Hypertensive heart disease   . PAD (peripheral artery disease) ([place])    a. 03/2015 Periph Angio: short occlusion of L pop w/ evidence of embolization into the DP->5.0x50 mm Inova self-expanding stent;  b. 04/08/2015 ABI: R 1.12, L 0.99.  . Tobacco abuse     Patient Active Problem List   Diagnosis Date Noted  . Iron deficiency anemia 01/08/2016  . Peripheral neuropathy ([place]) 06/12/2015  . PAD (peripheral artery disease) ([place]) 03/29/2015  . Essential (primary) hypertension 10/05/2014  . Hypercholesterolemia without hypertriglyceridemia 10/05/2014  . S/P CABG x 3 08/25/2014  . H/O  coronary artery bypass surgery 08/25/2014  . History of MI (myocardial infarction) 08/24/2014  . CAD (coronary artery disease) 08/24/2014  . Right ankle pain 07/20/2014  . Arthralgia of ankle or foot 07/20/2014  . Tobacco abuse 07/06/2014  . Seizures ([place]) 07/06/2014  . Preventative health care 07/06/2014  . Extremity pain 07/06/2014    Past Surgical History:  Procedure Laterality Date  . CARDIAC CATHETERIZATION N/A 08/24/2014   Procedure: Left Heart Cath and Coronary Angiography;  Surgeon: Isaias Cowman, MD;  Location: [place] [facility];  Service: Cardiovascular;  Laterality: N/A;  . CARDIAC CATHETERIZATION N/A 08/24/2014   Procedure: Coronary Stent Intervention;  Surgeon: Isaias Cowman, MD;  Location: [place] [facility];  Service: Cardiovascular;  Laterality: N/A;  . CORONARY ARTERY BYPASS GRAFT N/A 08/25/2014   Procedure: CORONARY ARTERY BYPASS GRAFTING (CABG), ON PUMP, TIMES THREE, USING BILATERAL MAMMARY ARTERIES, RIGHT GREATER SAPHENOUS VEIN HARVESTED ENDOSCOPICALLY;  Surgeon: Melrose Nakayama, MD;  Location: [facility];  Service: Open Heart Surgery;  Laterality: N/A;  . PERIPHERAL VASCULAR CATHETERIZATION N/A 03/30/2015   Procedure: Abdominal Aortogram w/Lower Extremity;  Surgeon: Wellington Hampshire, MD;  Location: [place];  Service: Cardiovascular;  Laterality: N/A;  . TEE WITHOUT CARDIOVERSION N/A 08/25/2014   Procedure: TRANSESOPHAGEAL ECHOCARDIOGRAM (TEE);  Surgeon: Melrose Nakayama, MD;  Location: [facility];  Service: Open Heart Surgery;  Laterality: N/A;    Prior to Admission medications   Medication Sig Start Date End Date Taking? Authorizing Provider  clopidogrel (PLAVIX) 75 MG tablet Take 75 mg by  mouth daily.   Yes Historical Provider, MD  lisinopril (PRINIVIL,ZESTRIL) 5 MG tablet TAKE 1 TABLET BY MOUTH DAILY 06/11/16  Yes Rogelia Mire, NP  metoprolol tartrate (LOPRESSOR) 25 MG tablet Take 1 tablet (25 mg total) by mouth 2 (two) times daily.  07/22/15  Yes Minna Merritts, MD  omeprazole (PRILOSEC) 20 MG capsule Take 20 mg by mouth daily.   Yes Historical Provider, MD  atorvastatin (LIPITOR) 40 MG tablet Take 1 tablet (40 mg total) by mouth daily. Patient not taking: Reported on 06/28/2016 05/19/15   Rogelia Mire, NP  clindamycin (CLEOCIN) 300 MG capsule Take 1 capsule (300 mg total) by mouth 3 (three) times daily. 06/28/16   Lisa Roca, MD  diphenhydrAMINE (BENADRYL) 25 mg capsule Take 25 mg by mouth 2 (two) times daily as needed for allergies.     Historical Provider, MD  fluticasone (FLONASE) 50 MCG/ACT nasal spray Place 1 spray into both nostrils daily. Patient taking differently: Place 1 spray into both nostrils daily as needed for allergies.  09/14/14 12/13/15  Tasrif Ahmed, MD  gabapentin (NEURONTIN) 600 MG tablet Take 600 mg by mouth 3 (three) times daily.    Historical Provider, MD  oxyCODONE-acetaminophen (PERCOCET) 5-325 MG tablet Take 1 tablet by mouth every 4 (four) hours as needed for severe pain. Patient not taking: Reported on 06/28/2016 05/17/16   Johnn Hai, PA-C    Allergies  Allergen Reactions  . Other Swelling    Other reaction(s): Other (See Comments) Pt states that he got "knots behind his ears".  . Contrast Media [Iodinated Diagnostic Agents] Other (See Comments)    Pt states that he got "knots behind his ears".  . Nsaids Other (See Comments)    Other reaction(s): Other (See Comments) Reaction:  Blood in stool  Reaction:  Blood in stool   . Ibuprofen Other (See Comments) and Nausea Only    Blood in stools    Family History  Problem Relation Age of Onset  . Hypertension Father   . Heart attack Mother 11    STENT  . Hyperlipidemia Mother   . Heart attack Brother   . Heart attack Brother   . Breast cancer Sister   . Heart attack Sister     Social History Social History  Substance Use Topics  . Smoking status: Current Every Day Smoker    Packs/day: 0.20    Types: Cigarettes  .  Smokeless tobacco: Never Used     Comment: 4cigs day down from 2ppd  . Alcohol use 0.0 oz/week     Comment: Sometimes on w/e.    Review of Systems  Constitutional: Negative for fever. Eyes: Negative for visual changes. ENT: Negative for sore throat. Cardiovascular: Central chest discomfort as well as cough as per history of present illness. Respiratory: Mild cough as per history of present illness. Gastrointestinal: Negative for abdominal pain, vomiting and diarrhea. Genitourinary: Negative for dysuria. Musculoskeletal: Negative for back pain. Skin: Left forearm/wrist erythema  Neurological: Negative for headache. 10 point Review of Systems otherwise negative ____________________________________________   PHYSICAL EXAM:  VITAL SIGNS: ED Triage Vitals [06/28/16 0854]  Enc Vitals Group     BP (!) 151/87     Pulse Rate (!) 114     Resp 18     Temp 97.3 F (36.3 C)     Temp Source Oral     SpO2 100 %     Weight 164 lb (74.4 kg)     Height '5\' 11"'$  (1.803  m)     Head Circumference      Peak Flow      Pain Score 7     Pain Loc      Pain Edu?      Excl. in [place]?      Constitutional: Alert and oriented. Well appearing and in no distress. HEENT   Head: Normocephalic and atraumatic.      Eyes: Conjunctivae are normal. PERRL. Normal extraocular movements.      Ears:         Nose: No congestion/rhinnorhea.   Mouth/Throat: Mucous membranes are moist.   Neck: No stridor. Cardiovascular/Chest: Slightly tachycardic, regular rhythm.  No murmurs, rubs, or gallops. Respiratory: Normal respiratory effort without tachypnea nor retractions. No wheezing. Mild rhonchi especially both bases. Cough. Gastrointestinal: Soft. No distention, no guarding, no rebound. Nontender.    Genitourinary/rectal:Deferred Musculoskeletal: Left distal forearm just above the left wrist small area of scabbing with surrounding erythema about a third of the forearm, not circumferential, sparing the  volar surface. Mildly tender to palpation.  Able to range the left wrist without significant tenderness, no joint effusion palpable. Neurologic:  Normal speech and language. No gross or focal neurologic deficits are appreciated. Skin:  Skin is warm, dry.  Erythema to the left forearm and over The left wrist. Psychiatric: Mood and affect are normal. Speech and behavior are normal. Patient exhibits appropriate insight and judgment.   ____________________________________________  LABS (pertinent positives/negatives)  Labs Reviewed  BASIC METABOLIC PANEL - Abnormal; Notable for the following:       Result Value   Sodium 132 (*)    Glucose, Bld 137 (*)    BUN <5 (*)    Calcium 8.6 (*)    All other components within normal limits  CBC WITH DIFFERENTIAL/PLATELET - Abnormal; Notable for the following:    RBC 4.20 (*)    HCT 39.7 (*)    Lymphs Abs 0.8 (*)    All other components within normal limits  CULTURE, BLOOD (ROUTINE X 2)  CULTURE, BLOOD (ROUTINE X 2)  TROPONIN I  TROPONIN I  LACTATE DEHYDROGENASE  URIC ACID    ____________________________________________    EKG I, Lisa Roca, MD, the attending physician have personally viewed and interpreted all ECGs.  120 bpm. Sinus tachycardia. Narrow QRS. Normal axis. Nonspecific ST and T wave. LVH with repolarization changes. ____________________________________________  RADIOLOGY All Xrays were viewed by me. Imaging interpreted by Radiologist.  Chest x-ray portable:IMPRESSION: Left hilar fullness. Cannot exclude adenopathy. Recommend chest CT with IV contrast for further evaluation.  Chest CT without contrast (allergy to contrast):  IMPRESSION: Extensive adenopathy. This degree of adenopathy raises concern for potential neoplasia. Lymphoma in particular is a differential consideration given the degree of adenopathy present.  Scattered bullae in the lung apices, more on the right than on the left. 4 x 3 mm nodular opacity  anterior segment left upper lobe. No other pulmonary nodular lesion evident.  Multiple foci of atherosclerotic calcification as well as foci of coronary artery calcification. Patient has had coronary artery bypass grafting.  Cholelithiasis. __________________________________________  PROCEDURES  Procedure(s) performed: None  Critical Care performed: None  ____________________________________________   ED COURSE / ASSESSMENT AND PLAN  Pertinent labs & imaging results that were available during my care of the patient were reviewed by me and considered in my medical decision making (see chart for details).   Patient came here because of left arm/wrist skin infection. I think it looks like it could be managed as  an outpatient. Will skin marker around it. Patient was given clindamycin.  He is also complaining of some chest discomfort and does have a history of CABG.  Initial EKG and troponin are reassuring.  His chest x-ray raises concern for left hilar fullness and recommending chest CT. I ordered without contrast given patient has reported allergy to contrast dye.   Chest CT shows no obvious mass, but there is extensive lymphadenopathy, I discussed with oncologist, Dr. Mike Gip, patient to make outpatient appointment for further evaluation after checking ldh and uric acid.  From a cardiac standpoint, repeat troponin is also reassuring.  From the cellulitis standpoint, patient okay for discharge, he understands what to watch for.  Patient care transferred to Dr. Corky Downs at shift change 3:30pm, pending uric acid and ldh.  It is okay, may be discharged with my prepared discharge instructions.   CONSULTATIONS:   Dr. Mike Gip, oncology - patient to follow up as an outpatient if uric acid and LDH also ok.  Patient / Family / Caregiver informed of clinical course, medical decision-making process, and agree with plan.   I discussed return precautions, follow-up instructions, and  discharge instructions with patient and/or family.  Discharge instructions:  Return to the ER for any new or worsening redness, swelling, pain, fever, drainage, or any other symptoms concerning to you.  Return if the redness and swelling are beyond the line drawn today.  You were also evaluated for chest discomfort, and although no certain cause was found, your exam and evaluation are reassuring in the emergency department today. Please follow up with your cardiologist. Return to the emergency department immediately for any new or worsening chest discomfort, trouble breathing, nausea, sweats, palpitations, or any other symptoms concerning to you.  Your CT scan showed swollen lymph nodes, and you need to follow up with Oncology for further investigation into the cause.  ___________________________________________   FINAL CLINICAL IMPRESSION(S) / ED DIAGNOSES   Final diagnoses:  Cellulitis of left forearm  Chest pain, unspecified type  Hyponatremia  Lymphadenopathy              Note: This dictation was prepared with Dragon dictation. Any transcriptional errors that result from this process are unintentional    Lisa Roca, MD 06/28/16 1515

## 2016-06-28 NOTE — Discharge Instructions (Addendum)
You are being treated for skin infection called cellulitis of your left arm.  Return to the ER for any new or worsening redness, swelling, pain, fever, drainage, or any other symptoms concerning to you.  Return if the redness and swelling are beyond the line drawn today.  You were also evaluated for chest discomfort, and although no certain cause was found, your exam and evaluation are reassuring in the emergency department today. Please follow up with your cardiologist. Return to the emergency department immediately for any new or worsening chest discomfort, trouble breathing, nausea, sweats, palpitations, or any other symptoms concerning to you.  Your CT scan showed swollen lymph nodes, and you need to follow up with Oncology for further investigation into the cause.

## 2016-07-02 ENCOUNTER — Inpatient Hospital Stay: Payer: Medicaid Other | Attending: Hematology and Oncology | Admitting: Hematology and Oncology

## 2016-07-02 ENCOUNTER — Encounter (INDEPENDENT_AMBULATORY_CARE_PROVIDER_SITE_OTHER): Payer: Self-pay

## 2016-07-02 ENCOUNTER — Telehealth: Payer: Self-pay | Admitting: Cardiovascular Disease

## 2016-07-02 ENCOUNTER — Ambulatory Visit: Payer: No Typology Code available for payment source | Admitting: Hematology and Oncology

## 2016-07-02 ENCOUNTER — Encounter: Payer: Self-pay | Admitting: Hematology and Oncology

## 2016-07-02 VITALS — BP 122/70 | HR 85 | Temp 97.0°F | Resp 20 | Ht 71.0 in | Wt 155.9 lb

## 2016-07-02 DIAGNOSIS — Z79899 Other long term (current) drug therapy: Secondary | ICD-10-CM | POA: Insufficient documentation

## 2016-07-02 DIAGNOSIS — R590 Localized enlarged lymph nodes: Secondary | ICD-10-CM | POA: Diagnosis present

## 2016-07-02 DIAGNOSIS — Z86718 Personal history of other venous thrombosis and embolism: Secondary | ICD-10-CM | POA: Diagnosis not present

## 2016-07-02 DIAGNOSIS — D649 Anemia, unspecified: Secondary | ICD-10-CM

## 2016-07-02 DIAGNOSIS — R61 Generalized hyperhidrosis: Secondary | ICD-10-CM | POA: Insufficient documentation

## 2016-07-02 DIAGNOSIS — R918 Other nonspecific abnormal finding of lung field: Secondary | ICD-10-CM

## 2016-07-02 DIAGNOSIS — F17210 Nicotine dependence, cigarettes, uncomplicated: Secondary | ICD-10-CM | POA: Diagnosis not present

## 2016-07-02 DIAGNOSIS — Z803 Family history of malignant neoplasm of breast: Secondary | ICD-10-CM | POA: Insufficient documentation

## 2016-07-02 DIAGNOSIS — I739 Peripheral vascular disease, unspecified: Secondary | ICD-10-CM

## 2016-07-02 DIAGNOSIS — I2510 Atherosclerotic heart disease of native coronary artery without angina pectoris: Secondary | ICD-10-CM | POA: Diagnosis not present

## 2016-07-02 DIAGNOSIS — E785 Hyperlipidemia, unspecified: Secondary | ICD-10-CM | POA: Insufficient documentation

## 2016-07-02 DIAGNOSIS — L03114 Cellulitis of left upper limb: Secondary | ICD-10-CM | POA: Diagnosis not present

## 2016-07-02 DIAGNOSIS — Z800 Family history of malignant neoplasm of digestive organs: Secondary | ICD-10-CM

## 2016-07-02 DIAGNOSIS — R634 Abnormal weight loss: Secondary | ICD-10-CM

## 2016-07-02 NOTE — Progress Notes (Signed)
Pt here for mediastinal lymphadenopathy.  Congested in head and some in chest-clear drainage. Pt states he wen tto ER originally because of open sore of left forearm-states it was infectiion-cellulitis and he is on atb, states it is painful and draining pus

## 2016-07-02 NOTE — Telephone Encounter (Signed)
Lmov for patient to call back  He was seen in ED fu for CP on 06/28/16   Will try again at a later time.

## 2016-07-02 NOTE — Progress Notes (Signed)
[facility] day:  07/02/2016  Chief Complaint: Philip [name] is a 56 y.o. male with lymphadenopathy who is referred in consultation by Dr. Lisa Roca for assessment and management.  HPI:  The patient was seen in the [facility] ER on 06/28/2016.  He presented with a skin infection on his left wrist.  He stated that the lesion on his wrist popped up on 06/26/2016 and grew rapidly in size and was associated' \with'  erythema and swelling.  He denied any fever or chills.   He was prescribed clindamycin.  He described some chest discomfort while in the ER.  CBC revealed a hematocrit 39.7, hemoglobin 14.3, platelets 184,000, white count 4400 with an ANC of 3100. Differential was unremarkable.  Creatinine was 0.63.  Uric acid was 4.2.  LDH was 132.  CXR revealed left hilar fullness.    Chest CT on 06/28/2016 revealed extensive adenopathy.  There were multiple lymph nodes descending thoracic aorta, largest measuring 2.1 x 1.7 cm.  There was adenopathy in the aortopulmonary window with the largest confluence of lymph nodes measuring 4 x 2.6 cm. There was hilar adenopathy on the left measuring 2 x 1.9 cm. There were multiple enlarged lymph nodes in the pretracheal region, largest 1.7 x 1.7 cm.  There were lymph nodes to the left of the carina measuring 2.8 x 1.9 cm. There was a subcarinal lymph node measuring 2.4 x 1.6 cm. There was a 4 x 3 mm nodular opacity in the anterior segment of the left upper lobe.    He has a history of DVT a few months after his heart surgery in 08/2014.  He has peripheral vascular disease s/p stent placement in 03/2015.  He is on a Plavix.  Symptomatically, he has a 2 year history of night sweats.  He has lost 36 pounds to 6 months. He previously weighed 190 pounds. He notes no appetite at times. He has had some nausea and vomiting, but no diarrhea. He denies any melena or hematochezia.  Last colonoscopy was 30 years ago. He has a history of  bleeding ulcers.  He feels weak a lot at work and gives out after 15-20 minutes.  He has pain across his chest and stabbing back pain.   Past Medical History:  Diagnosis Date  . Anemia   . CAD (coronary artery disease)    a. 08/2014 Inf STEMI/CABG x 3 (LIMA->LAD, VG->Diag, RIMA->RCA).  . DVT, recurrent, lower extremity, acute, left ([place]) 2016  . Hyperlipidemia   . Hypertension   . Hypertensive heart disease   . PAD (peripheral artery disease) ([place])    a. 03/2015 Periph Angio: short occlusion of L pop w/ evidence of embolization into the DP->5.0x50 mm Inova self-expanding stent;  b. 04/08/2015 ABI: R 1.12, L 0.99.  . Tobacco abuse     Past Surgical History:  Procedure Laterality Date  . CARDIAC CATHETERIZATION N/A 08/24/2014   Procedure: Left Heart Cath and Coronary Angiography;  Surgeon: Isaias Cowman, MD;  Location: [place] [facility];  Service: Cardiovascular;  Laterality: N/A;  . CARDIAC CATHETERIZATION N/A 08/24/2014   Procedure: Coronary Stent Intervention;  Surgeon: Isaias Cowman, MD;  Location: [place] [facility];  Service: Cardiovascular;  Laterality: N/A;  . CORONARY ARTERY BYPASS GRAFT N/A 08/25/2014   Procedure: CORONARY ARTERY BYPASS GRAFTING (CABG), ON PUMP, TIMES THREE, USING BILATERAL MAMMARY ARTERIES, RIGHT GREATER SAPHENOUS VEIN HARVESTED ENDOSCOPICALLY;  Surgeon: Melrose Nakayama, MD;  Location: [place];  Service: Open  Heart Surgery;  Laterality: N/A;  . PERIPHERAL VASCULAR CATHETERIZATION N/A 03/30/2015   Procedure: Abdominal Aortogram w/Lower Extremity;  Surgeon: Wellington Hampshire, MD;  Location: [place] [facility];  Service: Cardiovascular;  Laterality: N/A;  . TEE WITHOUT CARDIOVERSION N/A 08/25/2014   Procedure: TRANSESOPHAGEAL ECHOCARDIOGRAM (TEE);  Surgeon: Melrose Nakayama, MD;  Location: [place];  Service: Open Heart Surgery;  Laterality: N/A;    Family History  Problem Relation Age of Onset  . Hypertension Father   . Lung cancer Father   .  Heart attack Mother 68    STENT  . Hyperlipidemia Mother   . Heart attack Brother   . Heart attack Brother   . Breast cancer Sister   . Heart attack Sister   . Colon cancer Paternal Grandfather     Social History:  reports that he has been smoking Cigarettes.  He has been smoking about 0.20 packs per day. He has never used smokeless tobacco. He reports that he drinks alcohol. He reports that he does not use drugs.  The patient smokes 1/2 pack of cigarettes/day (previously 2 1/2 ppd).  He started smoking in his teens.  He denies any exposure to radiation or toxins.  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  He lives in [place].  The patient is accompanied by his wife, Mariann Laster, today.  Allergies:  Allergies  Allergen Reactions  . Contrast Media [Iodinated Diagnostic Agents] Other (See Comments)    Pt states that he got "knots behind his ears".  . Nsaids Other (See Comments)    Other reaction(s): Other (See Comments) Reaction:  Blood in stool  Reaction:  Blood in stool   . Ibuprofen Other (See Comments) and Nausea Only    Blood in stools    Current Medications: Current Outpatient Prescriptions  Medication Sig Dispense Refill  . clindamycin (CLEOCIN) 300 MG capsule Take 1 capsule (300 mg total) by mouth 3 (three) times daily. 30 capsule 0  . clopidogrel (PLAVIX) 75 MG tablet Take 75 mg by mouth daily.    . diphenhydrAMINE (BENADRYL) 25 mg capsule Take 25 mg by mouth 2 (two) times daily as needed for allergies.     Marland Kitchen lisinopril (PRINIVIL,ZESTRIL) 5 MG tablet TAKE 1 TABLET BY MOUTH DAILY 90 tablet 1  . metoprolol tartrate (LOPRESSOR) 25 MG tablet Take 1 tablet (25 mg total) by mouth 2 (two) times daily. 180 tablet 3  . omeprazole (PRILOSEC) 20 MG capsule Take 20 mg by mouth daily.    Marland Kitchen atorvastatin (LIPITOR) 40 MG tablet Take 1 tablet (40 mg total) by mouth daily. (Patient not taking: Reported on 06/28/2016) 90 tablet 3  . fluticasone (FLONASE) 50 MCG/ACT nasal spray Place  1 spray into both nostrils daily. (Patient taking differently: Place 1 spray into both nostrils daily as needed for allergies. ) 16 g 2   No current facility-administered medications for this visit.     Review of Systems:  GENERAL:  Fatigue.  Night sweats x 2 years.  Weight loss of 36 pounds (previously 190 pounds). PERFORMANCE STATUS (ECOG):  1 HEENT:  Irritated throat at night.  Runny nose.  Sore throat.  No visual changes, mouth sores or tenderness. Lungs: No shortness of breath or cough.  No hemoptysis. Cardiac:  Chest pain.  No palpitations, orthopnea, or PND. GI:  Sometimes no appetite.  Nausea and vomiting.  No diarrhea, constipation, melena or hematochezia.  Colonoscopy 30 years ago.   GU:  No urgency, frequency,  dysuria, or hematuria. Musculoskeletal:  No back pain.  No joint pain.  No muscle tenderness. Extremities:  Leg cramps.  No pain or swelling. Left toe removed (old). Skin:  Left wrist lesion improving on antibiotics.  No rashes or skin changes. Neuro:  Little headache.  Nonumbness or weakness, balance or coordination issues. Endocrine:  No diabetes, thyroid issues, hot flashes or night sweats. Psych:  No mood changes, depression or anxiety. Pain:  No focal pain. Review of systems:  All other systems reviewed and found to be negative.  Physical Exam: Blood pressure 122/70, pulse 85, temperature 97 F (36.1 C), temperature source Tympanic, resp. rate 20, height '5\' 11"'  (1.803 m), weight 155 lb 13.8 oz (70.7 kg). GENERAL:  Well developed, well nourished, sitting comfortably in the exam room in no acute distress. MENTAL STATUS:  Alert and oriented to person, place and time. HEAD:  Black hair with graying.  Scruffy beard.  Normocephalic, atraumatic, face symmetric, no Cushingoid features. EYES:  Brown eyes.  Pupils equal round and reactive to light and accomodation.  No conjunctivitis or scleral icterus. ENT:  Oropharynx clear without lesion.  Tongue normal.  Poor dentition.   Mucous membranes moist.  RESPIRATORY:  Clear to auscultation without rales, wheezes or rhonchi. CARDIOVASCULAR:  Regular rate and rhythm without murmur, rub or gallop. ABDOMEN:  Soft, non-tender, with active bowel sounds, and no hepatosplenomegaly.  No masses. SKIN:  No rashes, ulcers or lesions. EXTREMITIES: Left wist with a 2 x 1 cm nodular area with associated ruddiness and no erythema.  No swelling.  No purulence.  No edema, no skin discoloration or tenderness.  No palpable cords. LYMPH NODES: No palpable cervical, supraclavicular, axillary or inguinal adenopathy  NEUROLOGICAL: Unremarkable. PSYCH:  Appropriate.   No visits with results within 3 Day(s) from this visit.  Latest known visit with results is:  Admission on 06/28/2016, Discharged on 06/28/2016  Component Date Value Ref Range Status  . Specimen Description 06/28/2016 BLOOD L AC   Final  . Special Requests 06/28/2016 BOTTLES DRAWN AEROBIC AND ANAEROBIC BCHV   Final  . Culture 06/28/2016 NO GROWTH 4 DAYS   Final  . Report Status 06/28/2016 PENDING   Incomplete  . Specimen Description 06/28/2016 BLOOD L AC   Final  . Special Requests 06/28/2016 BOTTLES DRAWN AEROBIC AND ANAEROBIC BCHV   Final  . Culture 06/28/2016 NO GROWTH 4 DAYS   Final  . Report Status 06/28/2016 PENDING   Incomplete  . Sodium 06/28/2016 132* 135 - 145 mmol/L Final  . Potassium 06/28/2016 3.7  3.5 - 5.1 mmol/L Final  . Chloride 06/28/2016 101  101 - 111 mmol/L Final  . CO2 06/28/2016 23  22 - 32 mmol/L Final  . Glucose, Bld 06/28/2016 137* 65 - 99 mg/dL Final  . BUN 06/28/2016 <5* 6 - 20 mg/dL Final  . Creatinine, Ser 06/28/2016 0.63  0.61 - 1.24 mg/dL Final  . Calcium 06/28/2016 8.6* 8.9 - 10.3 mg/dL Final  . GFR calc non Af Amer 06/28/2016 >60  >60 mL/min Final  . GFR calc Af Amer 06/28/2016 >60  >60 mL/min Final   Comment: (NOTE) The eGFR has been calculated using the CKD EPI equation. This calculation has not been validated in all clinical  situations. eGFR's persistently <60 mL/min signify possible Chronic Kidney Disease.   . Anion gap 06/28/2016 8  5 - 15 Final  . Troponin I 06/28/2016 <0.03  <0.03 ng/mL Final  . WBC 06/28/2016 4.4  3.8 - 10.6 K/uL Final  .  RBC 06/28/2016 4.20* 4.40 - 5.90 MIL/uL Final  . Hemoglobin 06/28/2016 14.3  13.0 - 18.0 g/dL Final  . HCT 06/28/2016 39.7* 40.0 - 52.0 % Final  . MCV 06/28/2016 94.6  80.0 - 100.0 fL Final  . MCH 06/28/2016 34.0  26.0 - 34.0 pg Final  . MCHC 06/28/2016 35.9  32.0 - 36.0 g/dL Final  . RDW 06/28/2016 14.1  11.5 - 14.5 % Final  . Platelets 06/28/2016 184  150 - 440 K/uL Final  . Neutrophils Relative % 06/28/2016 70  % Final  . Neutro Abs 06/28/2016 3.1  1.4 - 6.5 K/uL Final  . Lymphocytes Relative 06/28/2016 19  % Final  . Lymphs Abs 06/28/2016 0.8* 1.0 - 3.6 K/uL Final  . Monocytes Relative 06/28/2016 9  % Final  . Monocytes Absolute 06/28/2016 0.4  0.2 - 1.0 K/uL Final  . Eosinophils Relative 06/28/2016 1  % Final  . Eosinophils Absolute 06/28/2016 0.0  0 - 0.7 K/uL Final  . Basophils Relative 06/28/2016 1  % Final  . Basophils Absolute 06/28/2016 0.0  0 - 0.1 K/uL Final  . Troponin I 06/28/2016 <0.03  <0.03 ng/mL Final  . LDH 06/28/2016 132  98 - 192 U/L Final  . Uric Acid, Serum 06/28/2016 4.2* 4.4 - 7.6 mg/dL Final    Assessment:  Philip [name] is a 56 y.o. male with extensive thoracic adenopathy suggestive of lymphoma.  He has B symptoms with a 2 year history of night sweats and a 36 pound weight loss in the past 6 months.  Chest CT on 06/28/2016 revealed extensive adenopathy.  There were multiple lymph nodes descending thoracic aorta, largest measuring 2.1 x 1.7 cm.  There was adenopathy in the aortopulmonary window with the largest confluence of lymph nodes measuring 4 x 2.6 cm. There was hilar adenopathy on the left measuring 2 x 1.9 cm. There were multiple enlarged lymph nodes in the pretracheal region, largest 1.7 x 1.7 cm.  There were lymph nodes to  the left of the carina measuring 2.8 x 1.9 cm. There was a subcarinal lymph node measuring 2.4 x 1.6 cm. There was a 4 x 3 mm nodular opacity in the anterior segment of the left upper lobe.    He has a history of left lower extremity DVT a few months after his cardiac surgery in 08/2014.  He has peripheral vascular disease s/p stent placement in 03/2015.  He is on a Plavix.  He has not had a colonoscopy in 30 years.  Symptomatically, he has some chest discomfort and B symptoms.  Exam reveals no adenopathy.  He has a healing left wrist (cellulitis) on clindamycin..  Plan: 1.  Review images with patient.  Discuss concern for lymphoma given extensive adenopathy and B symptoms.  Patient also has an extensive smoking history.  Differential diagnosis includes lung cancer.  Discuss plan for PET scan followed by biopsy.   2.  Follow-up with Elease Etienne, social work, re: concerns about cost of treatment. 3.  PET scan. 4.  RTC after PET scan.   Lequita Asal, MD  07/02/2016, 3:04 PM

## 2016-07-03 LAB — CULTURE, BLOOD (ROUTINE X 2)
CULTURE: NO GROWTH
Culture: NO GROWTH

## 2016-07-03 NOTE — Telephone Encounter (Signed)
Lmov with patient spouse to call back  She said she would have him call us  He was at work

## 2016-07-06 NOTE — Telephone Encounter (Signed)
Pt is coming in on 07/23/16

## 2016-07-09 ENCOUNTER — Ambulatory Visit
Admission: RE | Admit: 2016-07-09 | Discharge: 2016-07-09 | Disposition: A | Discharge status: Home / Self Care | Payer: Medicaid Other | Source: Ambulatory Visit | Attending: Hematology and Oncology | Admitting: Hematology and Oncology

## 2016-07-09 DIAGNOSIS — K8020 Calculus of gallbladder without cholecystitis without obstruction: Secondary | ICD-10-CM | POA: Insufficient documentation

## 2016-07-09 DIAGNOSIS — IMO0002 Reserved for concepts with insufficient information to code with codable children: Secondary | ICD-10-CM

## 2016-07-09 DIAGNOSIS — R634 Abnormal weight loss: Secondary | ICD-10-CM

## 2016-07-09 DIAGNOSIS — C770 Secondary and unspecified malignant neoplasm of lymph nodes of head, face and neck: Secondary | ICD-10-CM | POA: Insufficient documentation

## 2016-07-09 DIAGNOSIS — R918 Other nonspecific abnormal finding of lung field: Secondary | ICD-10-CM | POA: Insufficient documentation

## 2016-07-09 DIAGNOSIS — C771 Secondary and unspecified malignant neoplasm of intrathoracic lymph nodes: Secondary | ICD-10-CM | POA: Insufficient documentation

## 2016-07-09 DIAGNOSIS — C7951 Secondary malignant neoplasm of bone: Secondary | ICD-10-CM | POA: Insufficient documentation

## 2016-07-09 DIAGNOSIS — R61 Generalized hyperhidrosis: Secondary | ICD-10-CM | POA: Insufficient documentation

## 2016-07-09 DIAGNOSIS — R590 Localized enlarged lymph nodes: Secondary | ICD-10-CM | POA: Insufficient documentation

## 2016-07-09 DIAGNOSIS — C3412 Malignant neoplasm of upper lobe, left bronchus or lung: Secondary | ICD-10-CM | POA: Insufficient documentation

## 2016-07-09 HISTORY — DX: Reserved for concepts with insufficient information to code with codable children: IMO0002

## 2016-07-09 LAB — GLUCOSE, CAPILLARY: Glucose-Capillary: 99 mg/dL (ref 65–99)

## 2016-07-09 MED ORDER — FLUDEOXYGLUCOSE F - 18 (FDG) INJECTION
12.0000 | Freq: Once | INTRAVENOUS | Status: AC | PRN
  Administered 2016-07-09: 13.11 via INTRAVENOUS

## 2016-07-12 ENCOUNTER — Encounter: Payer: Self-pay | Admitting: *Deleted

## 2016-07-12 ENCOUNTER — Inpatient Hospital Stay (HOSPITAL_BASED_OUTPATIENT_CLINIC_OR_DEPARTMENT_OTHER): Payer: Medicaid Other | Admitting: Hematology and Oncology

## 2016-07-12 VITALS — BP 110/71 | HR 79 | Temp 97.5°F | Resp 18 | Wt 155.4 lb

## 2016-07-12 DIAGNOSIS — C7951 Secondary malignant neoplasm of bone: Secondary | ICD-10-CM

## 2016-07-12 DIAGNOSIS — L03114 Cellulitis of left upper limb: Secondary | ICD-10-CM

## 2016-07-12 DIAGNOSIS — F17210 Nicotine dependence, cigarettes, uncomplicated: Secondary | ICD-10-CM

## 2016-07-12 DIAGNOSIS — E785 Hyperlipidemia, unspecified: Secondary | ICD-10-CM

## 2016-07-12 DIAGNOSIS — R590 Localized enlarged lymph nodes: Secondary | ICD-10-CM | POA: Diagnosis not present

## 2016-07-12 DIAGNOSIS — Z800 Family history of malignant neoplasm of digestive organs: Secondary | ICD-10-CM

## 2016-07-12 DIAGNOSIS — Z86718 Personal history of other venous thrombosis and embolism: Secondary | ICD-10-CM

## 2016-07-12 DIAGNOSIS — C3412 Malignant neoplasm of upper lobe, left bronchus or lung: Secondary | ICD-10-CM

## 2016-07-12 DIAGNOSIS — Z803 Family history of malignant neoplasm of breast: Secondary | ICD-10-CM

## 2016-07-12 DIAGNOSIS — R918 Other nonspecific abnormal finding of lung field: Secondary | ICD-10-CM

## 2016-07-12 DIAGNOSIS — Z79899 Other long term (current) drug therapy: Secondary | ICD-10-CM

## 2016-07-12 DIAGNOSIS — I2510 Atherosclerotic heart disease of native coronary artery without angina pectoris: Secondary | ICD-10-CM

## 2016-07-12 DIAGNOSIS — D649 Anemia, unspecified: Secondary | ICD-10-CM

## 2016-07-12 DIAGNOSIS — I739 Peripheral vascular disease, unspecified: Secondary | ICD-10-CM

## 2016-07-12 DIAGNOSIS — J189 Pneumonia, unspecified organism: Secondary | ICD-10-CM

## 2016-07-12 MED ORDER — LEVOFLOXACIN 500 MG PO TABS: 500.0000 mg | ORAL_TABLET | Freq: Every day | ORAL | 0 refills | Status: DC

## 2016-07-12 MED ORDER — BENZONATATE 100 MG PO CAPS: 100.0000 mg | ORAL_CAPSULE | Freq: Three times a day (TID) | ORAL | 1 refills | Status: DC | PRN

## 2016-07-12 NOTE — Progress Notes (Unsigned)
PSN met with patient today.  Informed patient of Social Security Disability and how to apply.  PSN will assist patient with food, gas,meds and utilities through the [employer] and [employer].

## 2016-07-12 NOTE — Progress Notes (Signed)
Patient here today for PET results.  Continues to cough.  Appetite decreased because cough.  Also keeps him awake at night.

## 2016-07-12 NOTE — Progress Notes (Signed)
[facility] day:  07/12/2016  Chief Complaint: Philip [name] is a 56 y.o. male with extensive thoracic lymphadenopathy who is seen for review of interval PET scan.  HPI:  The patient was last seen in the medical oncology clinic on 07/02/2016.  At that time, he was seen for initial consultation. He had some chest discomfort and B symptoms.  Exam revealed no adenopathy.  He had a healing left wrist (cellulitis) on clindamycin.  We discussed concerns for lymphoma and possible lung cancer given his smoking history.  We discussed imaging for staging and selection of a biopsy site.  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  There was nodal metastasis within the chest and supraclavicular/low jugular regions.  There was multifocal osseous metastasis.  There was possibly a pathologic fracture involving the posteromedial right tenth rib.  There was new left lower lobe opacity with mild hypermetabolism, suspicious for infection or postobstructive pneumonitis.  Symptomatically, he has bouts of coughing.  He denies any fevers.  Coughing has affected his appetite.   Past Medical History:  Diagnosis Date  . Anemia   . CAD (coronary artery disease)    a. 08/2014 Inf STEMI/CABG x 3 (LIMA->LAD, VG->Diag, RIMA->RCA).  . DVT, recurrent, lower extremity, acute, left ([place]) 2016  . Hx of CABG 07/09/2016   2016  . Hyperlipidemia   . Hypertension   . Hypertensive heart disease   . PAD (peripheral artery disease) ([place])    a. 03/2015 Periph Angio: short occlusion of L pop w/ evidence of embolization into the DP->5.0x50 mm Inova self-expanding stent;  b. 04/08/2015 ABI: R 1.12, L 0.99.  . Tobacco abuse   . Toe amputation status, left [facility]) 07/09/2016   2017    Past Surgical History:  Procedure Laterality Date  . CARDIAC CATHETERIZATION N/A 08/24/2014   Procedure: Left Heart Cath and Coronary Angiography;   Surgeon: Isaias Cowman, MD;  Location: [place] [facility];  Service: Cardiovascular;  Laterality: N/A;  . CARDIAC CATHETERIZATION N/A 08/24/2014   Procedure: Coronary Stent Intervention;  Surgeon: Isaias Cowman, MD;  Location: [place];  Service: Cardiovascular;  Laterality: N/A;  . CORONARY ARTERY BYPASS GRAFT N/A 08/25/2014   Procedure: CORONARY ARTERY BYPASS GRAFTING (CABG), ON PUMP, TIMES THREE, USING BILATERAL MAMMARY ARTERIES, RIGHT GREATER SAPHENOUS VEIN HARVESTED ENDOSCOPICALLY;  Surgeon: Melrose Nakayama, MD;  Location: [place];  Service: Open Heart Surgery;  Laterality: N/A;  . PERIPHERAL VASCULAR CATHETERIZATION N/A 03/30/2015   Procedure: Abdominal Aortogram w/Lower Extremity;  Surgeon: Wellington Hampshire, MD;  Location: [place] [facility];  Service: Cardiovascular;  Laterality: N/A;  . TEE WITHOUT CARDIOVERSION N/A 08/25/2014   Procedure: TRANSESOPHAGEAL ECHOCARDIOGRAM (TEE);  Surgeon: Melrose Nakayama, MD;  Location: [facility];  Service: Open Heart Surgery;  Laterality: N/A;    Family History  Problem Relation Age of Onset  . Hypertension Father   . Lung cancer Father   . Heart attack Mother 21    STENT  . Hyperlipidemia Mother   . Heart attack Brother   . Heart attack Brother   . Breast cancer Sister   . Heart attack Sister   . Colon cancer Paternal Grandfather     Social History:  reports that he has been smoking Cigarettes.  He has been smoking about 0.20 packs per day. He has never used smokeless tobacco. He reports that he drinks alcohol. He reports that he does not use drugs.  The patient smokes 1/2 pack of cigarettes/day (previously 2 1/2 ppd).  He started smoking in his teens.  He denies any exposure to radiation or toxins.  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  The patient is accompanied by his wife, Philip [name] and Philip [name], [place], today.  Allergies:  Allergies  Allergen Reactions  . Amitriptyline  Other (See Comments)    Burning sensation all over  . Contrast Media [Iodinated Diagnostic Agents] Other (See Comments)    Pt states that he got "knots behind his ears".  . Nsaids Other (See Comments)    Other reaction(s): Other (See Comments) Reaction:  Blood in stool  Reaction:  Blood in stool   . Ibuprofen Other (See Comments) and Nausea Only    Blood in stools    Current Medications: Current Outpatient Prescriptions  Medication Sig Dispense Refill  . atorvastatin (LIPITOR) 40 MG tablet Take 1 tablet (40 mg total) by mouth daily. 90 tablet 3  . clindamycin (CLEOCIN) 300 MG capsule Take 1 capsule (300 mg total) by mouth 3 (three) times daily. 30 capsule 0  . clopidogrel (PLAVIX) 75 MG tablet Take 75 mg by mouth daily.    . diphenhydrAMINE (BENADRYL) 25 mg capsule Take 25 mg by mouth 2 (two) times daily as needed for allergies.     . fluticasone (FLONASE) 50 MCG/ACT nasal spray Place 1 spray into both nostrils daily. (Patient taking differently: Place 1 spray into both nostrils daily as needed for allergies. ) 16 g 2  . lisinopril (PRINIVIL,ZESTRIL) 5 MG tablet TAKE 1 TABLET BY MOUTH DAILY 90 tablet 1  . metoprolol tartrate (LOPRESSOR) 25 MG tablet Take 1 tablet (25 mg total) by mouth 2 (two) times daily. 180 tablet 3  . omeprazole (PRILOSEC) 20 MG capsule Take 20 mg by mouth daily.    . benzonatate (TESSALON) 100 MG capsule Take 1 capsule (100 mg total) by mouth 3 (three) times daily as needed for cough. 30 capsule 1  . chlorpheniramine-HYDROcodone (TUSSIONEX PENNKINETIC ER) 10-8 MG/5ML SUER Take 5 mLs by mouth every 12 (twelve) hours as needed for cough. 140 mL 0  . HYDROcodone-acetaminophen (NORCO/VICODIN) 5-325 MG tablet Take 1 tablet by mouth every 6 (six) hours as needed for moderate pain. 30 tablet 0  . levofloxacin (LEVAQUIN) 500 MG tablet Take 1 tablet (500 mg total) by mouth daily. 10 tablet 0   No current facility-administered medications for this visit.     Review of  Systems:  GENERAL:  Fatigue.  Night sweats x 2 years.  Weight loss of 36 pounds (previously 190 pounds).  No weight loss since last visit. PERFORMANCE STATUS (ECOG):  1 HEENT:  Irritated throat at night.  Runny nose.  No visual changes, mouth sores or tenderness. Lungs: No shortness of breath.  Bouts of coughing.  No hemoptysis. Cardiac:  Chest pain.  No palpitations, orthopnea, or PND. GI:  Appetite affected by coughing.  Nausea and vomiting.  No diarrhea, constipation, melena or hematochezia.  Colonoscopy 30 years ago.   GU:  No urgency, frequency, dysuria, or hematuria. Musculoskeletal:  No back pain.  No joint pain.  No muscle tenderness. Extremities:  Leg cramps.  No pain or swelling. Left toe removed (old). Skin:  No rashes or skin changes. Neuro:  No headache, numbness or weakness, balance or coordination issues. Endocrine:  No diabetes, thyroid issues, hot flashes or night sweats. Psych:  No mood changes, depression or anxiety. Pain:  No focal pain. Review of systems:  All other systems reviewed and found to be negative.  Physical Exam: Blood pressure 110/71, pulse 79, temperature 97.5 F (36.4 C), temperature source Tympanic, resp. rate 18, weight 155 lb 7 oz (70.5 kg). GENERAL:  Well developed, well nourished, gentleman sitting comfortably in the exam room in no acute distress. MENTAL STATUS:  Alert and oriented to person, place and time. HEAD:  Black hair with graying.  Scruffy beard.  Normocephalic, atraumatic, face symmetric, no Cushingoid features. EYES:  Brown eyes.  No conjunctivitis or scleral icterus. NEUROLOGICAL: Unremarkable. PSYCH:  Appropriate.   No visits with results within 3 Day(s) from this visit.  Latest known visit with results is:  Hospital Outpatient Visit on 07/09/2016  Component Date Value Ref Range Status  . Glucose-Capillary 07/09/2016 99  65 - 99 mg/dL Final    Assessment:  MACALLAN ORD is a 56 y.o. male with extensive thoracic adenopathy  suggestive of lung cancer or lymphoma.  He has B symptoms with a 2 year history of night sweats and a 36 pound weight loss in the past 6 months.  Chest CT on 06/28/2016 revealed extensive adenopathy.  There were multiple lymph nodes descending thoracic aorta, largest measuring 2.1 x 1.7 cm.  There was adenopathy in the aortopulmonary window with the largest confluence of lymph nodes measuring 4 x 2.6 cm. There was hilar adenopathy on the left measuring 2 x 1.9 cm. There were multiple enlarged lymph nodes in the pretracheal region, largest 1.7 x 1.7 cm.  There were lymph nodes to the left of the carina measuring 2.8 x 1.9 cm. There was a subcarinal lymph node measuring 2.4 x 1.6 cm. There was a 4 x 3 mm nodular opacity in the anterior segment of the left upper lobe.    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  There was nodal metastasis within the chest and supraclavicular/low jugular regions.  There was multifocal osseous metastasis.  There was possibly a pathologic fracture involving the posteromedial right tenth rib.  There was new left lower lobe opacity with mild hypermetabolism, suspicious for infection or postobstructive pneumonitis.  He has a history of left lower extremity DVT a few months after his cardiac surgery in 08/2014.  He has peripheral vascular disease s/p stent placement in 03/2015.  He is on a Plavix.  He has not had a colonoscopy in 30 years.  Symptomatically, he has a cough.  Exam reveals no adenopathy.  He has a healing left wrist (cellulitis) on clindamycin.  Plan: 1.  Review PET scan images with patient.  Differential diagnosis at initial consult included lymphoma because of extensive adenopathy and B symptoms.  Today's images are more reflective of lung cancer.  Discussed metastatic disease to bones.  Discuss plan for biopsy.  Radiology recommended ultrasound guided neck node biopsy.  If unable to obtain adequate tissue,  he would require a bronchoscopy. 2.  Discuss concerns for developing post-obstructive pneumonia.  Discuss initiation of Levaquin.  Tessalon Perles for cough. 3.  Rx:  Levaquin 500 mg po q day (dis :#10; no refills). 4.  Rx:  Tessalon 100 mg po TID prn cough (dis: #30 with 1 refill). 5.  Ultrasound guided supraclavicular lymph node biopsy. 6.  Prior to biopsy: PT/PTT 7.  Anticipate head MRI if patient diagnosed with metastatic lung cancer. 8.  Anticipate initiation of monthly Xgeva for bone metastasis. 9.  RTC 3 days after biopsy for MD review of results.  Lequita Asal, MD  07/12/2016

## 2016-07-13 ENCOUNTER — Telehealth: Payer: Self-pay | Admitting: Cardiovascular Disease

## 2016-07-13 ENCOUNTER — Encounter: Payer: Self-pay | Admitting: *Deleted

## 2016-07-13 ENCOUNTER — Telehealth: Payer: Self-pay | Admitting: *Deleted

## 2016-07-13 NOTE — Progress Notes (Signed)
  Oncology Nurse Navigator Documentation  Navigator Location: [facility] (07/13/16 1100)   )Navigator Encounter Type: Letter/Fax/Email (07/13/16 1100)                    letter for cardiac clearance faxed to Dr. Tyrell Antonio office.                                 Time Spent with Patient: 15 (07/13/16 1100)

## 2016-07-13 NOTE — Telephone Encounter (Signed)
Pt informed about US guided biopsy scheduled on 4/5 at 2:30pm, pt instructed to arrive at 1:30pm in the medical mall to register. Pt scheduled for follow up with Dr. Mike Gip on 4/11 in [place] at 3:30pm. Pt was instructed to hold plavix and can restart the day after his biopsy. NPO after midnight, may take BP/heart meds morning of biopsy with small sip of water. Pt verbalized understanding.

## 2016-07-13 NOTE — Telephone Encounter (Signed)
STAT request for cardiac clearance received from [facility] for upcoming liver biopsy. Letter done for this clearance and faxed to number provided [phone]. Original letter and letter placed in "Faxed" bin on Chrysa Rampy's desk.

## 2016-07-13 NOTE — Progress Notes (Signed)
  Oncology Nurse Navigator Documentation  Navigator Location: [facility] (07/13/16 0700) Referral date to RadOnc/MedOnc: 07/02/16 (07/13/16 0700) )Navigator Encounter Type: Follow-up Appt (07/13/16 0700)   Abnormal Finding Date: 06/28/16 (07/13/16 0700)                 Patient Visit Type: MedOnc (07/13/16 0700)   Barriers/Navigation Needs: Financial;Coordination of Care (07/13/16 0700)   Interventions: Coordination of Care;Medication Assistance (07/13/16 0700)   Coordination of Care: Appts (07/13/16 0700)     Met with patient during follow up visit with Dr. Mike Gip to review PET scan results. Pt to be scheduled for biopsy of enlarged supraclavicular lymph node then to follow up with Dr. Mike Gip to review results. Pt currently taking plavix prescribed by Dr. Fletcher Anon, will contact his office to obtain cardiac clearance to hold plavix for 5 days prior to biopsy. Informed pt that will contact him with his appts once we obtain cardiac clearance. Pt met with Elease Etienne prior to appt to discuss financial concerns. Prescription for levaquin and benzonatate escribed to [facility] and will be covered under charitable funds per [employer]. Pt did not express any further questions or concerns. Pt encouraged to call if has further questions.              Time Spent with Patient: 60 (07/13/16 0700)

## 2016-07-16 ENCOUNTER — Telehealth: Payer: Self-pay | Admitting: *Deleted

## 2016-07-16 DIAGNOSIS — C3412 Malignant neoplasm of upper lobe, left bronchus or lung: Secondary | ICD-10-CM

## 2016-07-16 DIAGNOSIS — C7951 Secondary malignant neoplasm of bone: Secondary | ICD-10-CM

## 2016-07-16 MED ORDER — HYDROCOD POLST-CPM POLST ER 10-8 MG/5ML PO SUER: 5.0000 mL | Freq: Two times a day (BID) | ORAL | 0 refills | Status: DC | PRN

## 2016-07-16 MED ORDER — HYDROCODONE-ACETAMINOPHEN 5-325 MG PO TABS: 1.0000 | ORAL_TABLET | Freq: Four times a day (QID) | ORAL | 0 refills | Status: DC | PRN

## 2016-07-16 NOTE — Telephone Encounter (Signed)
Has been reported already

## 2016-07-16 NOTE — Telephone Encounter (Signed)
Pt called stating is having increased pain and coughing. Per Woodfin Ganja, will prescribe vicodin every 6 hours for pain and tussionex every 12 hours for cough. Pt informed that will need to pick up prescription at the registration desk at the cancer center. Also, pt informed that does not need to come for labs tomorrow since special procedures will be drawing labs prior to his biopsy on 4/5. Pt verbalized understanding.

## 2016-07-16 NOTE — Telephone Encounter (Signed)
Patient reports pain in chest and back area, increased coughing noted, Lanice Shirts RN notified to notify Dr. Lynnae Prude MD r/t pain and cough medication needs.

## 2016-07-17 ENCOUNTER — Inpatient Hospital Stay: Payer: Medicaid Other | Attending: Hematology and Oncology

## 2016-07-17 ENCOUNTER — Ambulatory Visit: Payer: No Typology Code available for payment source | Admitting: Oncology

## 2016-07-17 ENCOUNTER — Encounter: Payer: Self-pay | Admitting: Hematology and Oncology

## 2016-07-17 DIAGNOSIS — J189 Pneumonia, unspecified organism: Secondary | ICD-10-CM | POA: Insufficient documentation

## 2016-07-18 ENCOUNTER — Other Ambulatory Visit: Payer: Self-pay | Admitting: Radiology

## 2016-07-19 ENCOUNTER — Telehealth: Payer: Self-pay | Admitting: *Deleted

## 2016-07-19 ENCOUNTER — Ambulatory Visit: Admission: RE | Admit: 2016-07-19 | Payer: Self-pay | Source: Ambulatory Visit

## 2016-07-19 ENCOUNTER — Ambulatory Visit
Admission: RE | Admit: 2016-07-19 | Discharge: 2016-07-19 | Disposition: A | Discharge status: Home / Self Care | Payer: Medicaid Other | Source: Ambulatory Visit | Attending: Hematology and Oncology | Admitting: Hematology and Oncology

## 2016-07-19 DIAGNOSIS — C770 Secondary and unspecified malignant neoplasm of lymph nodes of head, face and neck: Secondary | ICD-10-CM | POA: Insufficient documentation

## 2016-07-19 DIAGNOSIS — Z7902 Long term (current) use of antithrombotics/antiplatelets: Secondary | ICD-10-CM | POA: Insufficient documentation

## 2016-07-19 DIAGNOSIS — Z79899 Other long term (current) drug therapy: Secondary | ICD-10-CM | POA: Insufficient documentation

## 2016-07-19 DIAGNOSIS — C3490 Malignant neoplasm of unspecified part of unspecified bronchus or lung: Secondary | ICD-10-CM | POA: Insufficient documentation

## 2016-07-19 DIAGNOSIS — R590 Localized enlarged lymph nodes: Secondary | ICD-10-CM | POA: Diagnosis not present

## 2016-07-19 DIAGNOSIS — F17210 Nicotine dependence, cigarettes, uncomplicated: Secondary | ICD-10-CM | POA: Insufficient documentation

## 2016-07-19 DIAGNOSIS — C7951 Secondary malignant neoplasm of bone: Secondary | ICD-10-CM | POA: Insufficient documentation

## 2016-07-19 DIAGNOSIS — C3412 Malignant neoplasm of upper lobe, left bronchus or lung: Secondary | ICD-10-CM | POA: Insufficient documentation

## 2016-07-19 LAB — PROTIME-INR
INR: 0.89
Prothrombin Time: 12 seconds (ref 11.4–15.2)

## 2016-07-19 LAB — CBC
HCT: 39.8 % — ABNORMAL LOW (ref 40.0–52.0)
Hemoglobin: 14.1 g/dL (ref 13.0–18.0)
MCH: 33.2 pg (ref 26.0–34.0)
MCHC: 35.5 g/dL (ref 32.0–36.0)
MCV: 93.6 fL (ref 80.0–100.0)
Platelets: 208 10*3/uL (ref 150–440)
RBC: 4.26 MIL/uL — ABNORMAL LOW (ref 4.40–5.90)
RDW: 14.1 % (ref 11.5–14.5)
WBC: 4.7 10*3/uL (ref 3.8–10.6)

## 2016-07-19 LAB — APTT: aPTT: 34 seconds (ref 24–36)

## 2016-07-19 MED ORDER — SODIUM CHLORIDE 0.9 % IV SOLN
INTRAVENOUS | Status: DC
  Administered 2016-07-19: 15:00:00 via INTRAVENOUS

## 2016-07-19 MED ORDER — MIDAZOLAM HCL 5 MG/5ML IJ SOLN
INTRAMUSCULAR | Status: AC | PRN
  Administered 2016-07-19 (×2): 1 mg via INTRAVENOUS

## 2016-07-19 MED ORDER — FENTANYL CITRATE (PF) 100 MCG/2ML IJ SOLN
INTRAMUSCULAR | Status: AC | PRN
  Administered 2016-07-19: 50 ug via INTRAVENOUS

## 2016-07-19 NOTE — H&P (Signed)
Chief Complaint: Patient was seen in consultation today for supraclavicular lymph node biopsy at the request of Merrillan C  Referring Physician(s): Chester C  Patient Status: [facility] - Out-pt  History of Present Illness: Philip [name] is a 56 y.o. male with left suprahilar lung mass, diffuse lymph node mets including bilateral supraclavicular nodes and bone mets.  Presenting for right supraclavicular LN biopsy. Currently no significant symptoms.  Past Medical History:  Diagnosis Date  . Anemia   . CAD (coronary artery disease)    a. 08/2014 Inf STEMI/CABG x 3 (LIMA->LAD, VG->Diag, RIMA->RCA).  . DVT, recurrent, lower extremity, acute, left ([place]) 2016  . Hx of CABG 07/09/2016   2016  . Hyperlipidemia   . Hypertension   . Hypertensive heart disease   . PAD (peripheral artery disease) ([place])    a. 03/2015 Periph Angio: short occlusion of L pop w/ evidence of embolization into the DP->5.0x50 mm Inova self-expanding stent;  b. 04/08/2015 ABI: R 1.12, L 0.99.  . Tobacco abuse   . Toe amputation status, left [facility]) 07/09/2016   2017    Past Surgical History:  Procedure Laterality Date  . CARDIAC CATHETERIZATION N/A 08/24/2014   Procedure: Left Heart Cath and Coronary Angiography;  Surgeon: Isaias Cowman, MD;  Location: [place] [facility];  Service: Cardiovascular;  Laterality: N/A;  . CARDIAC CATHETERIZATION N/A 08/24/2014   Procedure: Coronary Stent Intervention;  Surgeon: Isaias Cowman, MD;  Location: [place] [facility];  Service: Cardiovascular;  Laterality: N/A;  . CORONARY ARTERY BYPASS GRAFT N/A 08/25/2014   Procedure: CORONARY ARTERY BYPASS GRAFTING (CABG), ON PUMP, TIMES THREE, USING BILATERAL MAMMARY ARTERIES, RIGHT GREATER SAPHENOUS VEIN HARVESTED ENDOSCOPICALLY;  Surgeon: Melrose Nakayama, MD;  Location: [place];  Service: Open Heart Surgery;  Laterality: N/A;  . PERIPHERAL VASCULAR CATHETERIZATION N/A 03/30/2015   Procedure: Abdominal  Aortogram w/Lower Extremity;  Surgeon: Wellington Hampshire, MD;  Location: [place] [facility];  Service: Cardiovascular;  Laterality: N/A;  . TEE WITHOUT CARDIOVERSION N/A 08/25/2014   Procedure: TRANSESOPHAGEAL ECHOCARDIOGRAM (TEE);  Surgeon: Melrose Nakayama, MD;  Location: [place];  Service: Open Heart Surgery;  Laterality: N/A;    Allergies: Amitriptyline; Contrast media [iodinated diagnostic agents]; Nsaids; and Ibuprofen  Medications: Prior to Admission medications   Medication Sig Start Date End Date Taking? Authorizing Provider  atorvastatin (LIPITOR) 40 MG tablet Take 1 tablet (40 mg total) by mouth daily. 05/19/15  Yes Rogelia Mire, NP  benzonatate (TESSALON) 100 MG capsule Take 1 capsule (100 mg total) by mouth 3 (three) times daily as needed for cough. 07/12/16  Yes Lequita Asal, MD  chlorpheniramine-HYDROcodone (TUSSIONEX PENNKINETIC ER) 10-8 MG/5ML SUER Take 5 mLs by mouth every 12 (twelve) hours as needed for cough. 07/16/16  Yes Lloyd Huger, MD  clindamycin (CLEOCIN) 300 MG capsule Take 1 capsule (300 mg total) by mouth 3 (three) times daily. 06/28/16  Yes Lisa Roca, MD  clopidogrel (PLAVIX) 75 MG tablet Take 75 mg by mouth daily.   Yes Historical Provider, MD  diphenhydrAMINE (BENADRYL) 25 mg capsule Take 25 mg by mouth 2 (two) times daily as needed for allergies.    Yes Historical Provider, MD  HYDROcodone-acetaminophen (NORCO/VICODIN) 5-325 MG tablet Take 1 tablet by mouth every 6 (six) hours as needed for moderate pain. 07/16/16  Yes Lloyd Huger, MD  levofloxacin (LEVAQUIN) 500 MG tablet Take 1 tablet (500 mg total) by mouth daily. 07/12/16  Yes Lequita Asal, MD  lisinopril (PRINIVIL,ZESTRIL)  5 MG tablet TAKE 1 TABLET BY MOUTH DAILY 06/11/16  Yes Rogelia Mire, NP  metoprolol tartrate (LOPRESSOR) 25 MG tablet Take 1 tablet (25 mg total) by mouth 2 (two) times daily. 07/22/15  Yes Minna Merritts, MD  omeprazole (PRILOSEC) 20 MG capsule Take 20 mg  by mouth daily.   Yes Historical Provider, MD  fluticasone (FLONASE) 50 MCG/ACT nasal spray Place 1 spray into both nostrils daily. Patient taking differently: Place 1 spray into both nostrils daily as needed for allergies.  09/14/14 07/12/16  Dellia Nims, MD     Family History  Problem Relation Age of Onset  . Hypertension Father   . Lung cancer Father   . Heart attack Mother 63    STENT  . Hyperlipidemia Mother   . Heart attack Brother   . Heart attack Brother   . Breast cancer Sister   . Heart attack Sister   . Colon cancer Paternal Grandfather     Social History   Social History  . Marital status: Married    Spouse name: N/A  . Number of children: N/A  . Years of education: N/A   Social History Main Topics  . Smoking status: Current Every Day Smoker    Packs/day: 0.20    Types: Cigarettes  . Smokeless tobacco: Never Used     Comment: 4cigs day down from 2ppd  . Alcohol use 0.0 oz/week     Comment: Sometimes on w/e.  . Drug use: No  . Sexual activity: Not Asked   Other Topics Concern  . None   Social History Narrative  . None    Review of Systems: A 12 point ROS discussed and pertinent positives are indicated in the HPI above.  All other systems are negative.  Review of Systems  Constitutional: Positive for diaphoresis, fatigue and unexpected weight change.  Respiratory: Positive for wheezing. Negative for chest tightness and shortness of breath.   Cardiovascular: Negative.   Gastrointestinal: Negative.   Genitourinary: Negative.   Musculoskeletal: Negative.   Neurological: Negative.     Vital Signs: BP 125/77   Pulse 71   Temp 97.9 F (36.6 C)   Resp 18   Ht '5\' 11"'$  (1.803 m)   Wt 155 lb (70.3 kg)   SpO2 96%   BMI 21.62 kg/m   Physical Exam  Constitutional: He is oriented to person, place, and time. No distress.  HENT:  Head: Normocephalic and atraumatic.  Neck: Neck supple.  Cardiovascular: Normal rate, regular rhythm and normal heart  sounds.  Exam reveals no gallop and no friction rub.   No murmur heard. Pulmonary/Chest: Effort normal. He has wheezes.  Abdominal: Soft. Bowel sounds are normal. He exhibits no distension. There is no tenderness. There is no rebound and no guarding.  Musculoskeletal: He exhibits no edema.  Neurological: He is alert and oriented to person, place, and time.  Skin: He is not diaphoretic.    Mallampati Score:  MD Evaluation Airway: WNL Heart: WNL Abdomen: WNL Chest/ Lungs: Other (comments) Chest/ lungs comments: Bilateral wheezing ASA  Classification: 3 Mallampati/Airway Score: One  Imaging: Ct Chest Wo Contrast  Result Date: 06/28/2016 CLINICAL DATA:  Adenopathy appreciable on chest radiograph EXAM: CT CHEST WITHOUT CONTRAST TECHNIQUE: Multidetector CT imaging of the chest was performed following the standard protocol without IV contrast. COMPARISON:  Chest radiograph June 28, 2016 FINDINGS: Cardiovascular: There is no appreciable thoracic aortic aneurysm. There is mild atherosclerotic calcification involving the proximal great vessels. There is atherosclerotic calcification  throughout the aorta. There are scattered foci of coronary artery calcification. Patient is status post coronary artery bypass grafting. Pericardium is rather minimally thickened. Mediastinum/Nodes: Thyroid appears unremarkable. There is extensive adenopathy throughout the mediastinum. Multiple lymph nodes are noted adjacent to the descending thoracic aorta, largest measuring 2.1 x 1.7 cm. There is adenopathy in the aortopulmonary window region with the largest confluence of lymph nodes in this area measuring 4.0 x 2.6 cm. There is hilar adenopathy on the left measuring 2.0 x 1.9 cm. There are multiple enlarged lymph nodes in the pretracheal region, largest measuring 1.7 x 1.7 cm. There are very carinal lymph nodes, largest just to the left of the carina measuring 2.8 x 1.9 cm. There is a sub- carinal lymph node measuring  2.4 x 1.6 cm. Several other enlarged lymph nodes are noted in the mediastinum with the greatest concentration of lymph nodes in the precarinal region. Lungs/Pleura: There is evidence of bullae in the upper lobes, somewhat more on the right than on the left. There is scarring in the posterior left base region. There are areas of mild atelectatic change in the left upper lobe. There is no airspace consolidation or edema. On axial slice 47 series 3, there is a 4 x 3 mm nodular opacity in the anterior segment of the left upper lobe. No larger pulmonary nodular lesions are evident. Upper Abdomen: In the visualized upper abdomen, there is cholelithiasis. Gallbladder wall does not appear appreciably thickened. There is atherosclerotic calcification in the aorta. Visualized upper abdominal structures otherwise appear unremarkable. Musculoskeletal: Patient is status post median sternotomy. There are no evident blastic or lytic bone lesions. IMPRESSION: Extensive adenopathy. This degree of adenopathy raises concern for potential neoplasia. Lymphoma in particular is a differential consideration given the degree of adenopathy present. Scattered bullae in the lung apices, more on the right than on the left. 4 x 3 mm nodular opacity anterior segment left upper lobe. No other pulmonary nodular lesion evident. Multiple foci of atherosclerotic calcification as well as foci of coronary artery calcification. Patient has had coronary artery bypass grafting. Cholelithiasis. Electronically Signed   By: Lowella Grip III M.D.   On: 06/28/2016 14:54   Nm Pet Image Initial (pi) Skull Base To Thigh  Result Date: 07/09/2016 CLINICAL DATA:  Initial treatment strategy for weight loss with night sweats and mediastinal adenopathy. Smoking history. EXAM: NUCLEAR MEDICINE PET SKULL BASE TO THIGH TECHNIQUE: 13.1 mCi F-18 FDG was injected intravenously. Full-ring PET imaging was performed from the skull base to thigh after the radiotracer. CT  data was obtained and used for attenuation correction and anatomic localization. FASTING BLOOD GLUCOSE:  Value: 99 mg/dl COMPARISON:  Chest CT 06/28/2016. FINDINGS: NECK Bilateral low jugular/ supraclavicular nodal metastasis. Example right-sided node which measures 9 mm and a S.U.V. max of 5.1 on image 66/series 3. Bilateral carotid atherosclerosis. CHEST Central left suprahilar and upper lobe infiltrative lung primary. This measures on the order of 6.9 x 4.4 cm and a S.U.V. max of 13.4 on image 106/series 3. Contralateral mediastinal nodal metastasis, with an index right paratracheal node measuring 1.9 cm and a S.U.V. max of 10.7 on image 95/series 3. Prevascular nodes measure up to 1.4 cm and a S.U.V. max of 10.2 on image 92/series 3. Mild hypermetabolism corresponding to ill-defined left lower lobe peribronchovascular opacity, including on image 128/series 3. This is new, suspicious for infection or postobstructive pneumonitis. Prior median sternotomy. Mild cardiomegaly. Centrilobular and paraseptal emphysema. 4 mm left upper lobe pulmonary nodule is below  PET resolution. ABDOMEN/PELVIS No abdominopelvic parenchymal or nodal hypermetabolism. Gallstones of maximally 9 mm. Abdominal aortic atherosclerosis. Scattered colonic diverticula. SKELETON Multifocal hypermetabolic osseous metastasis. Focus of hypermetabolism within the tenth posteromedial right rib likely corresponds to a nondisplaced fracture on image 132/ series 3. This measures a S.U.V. max of 4.5. There is a left iliac area of vague hypermetabolism an subtle sclerosis which measures a S.U.V. max of 4.3 on image 216/ series 3. Subtle hypermetabolism involves the C5 vertebral body, measuring a S.U.V. max of 3.6. Left hip osteoarthritis. IMPRESSION: 1. 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 2. Nodal metastasis within the chest and supraclavicular/low jugular regions. 3. Multifocal osseous metastasis. Possibly pathologic fracture  involving the posteromedial right tenth rib. 4. New left lower lobe opacity with mild hypermetabolism, suspicious for infection or postobstructive pneumonitis. 5. Cholelithiasis. Electronically Signed   By: Abigail Miyamoto M.D.   On: 07/09/2016 13:17   Dg Chest Port 1 View  Result Date: 06/28/2016 CLINICAL DATA:  Chest pain EXAM: PORTABLE CHEST 1 VIEW COMPARISON:  09/28/2014 FINDINGS: Prior CABG. Heart is normal size. Mild peribronchial thickening. No confluent opacities. There is fullness in the left hilum. Cannot exclude adenopathy. IMPRESSION: Left hilar fullness. Cannot exclude adenopathy. Recommend chest CT with IV contrast for further evaluation. Bronchitic changes. Prior CABG. Electronically Signed   By: Rolm Baptise M.D.   On: 06/28/2016 10:19    Labs:  CBC:  Recent Labs  08/17/15 1734 05/17/16 1056 06/28/16 0910 07/19/16 1427  WBC 7.7 6.9 4.4 4.7  HGB 10.7* 14.5 14.3 14.1  HCT 32.6* 41.7 39.7* 39.8*  PLT 258 208 184 208    COAGS:  Recent Labs  07/19/16 1427  INR 0.89  APTT 34    BMP:  Recent Labs  08/17/15 1734 05/17/16 1056 06/28/16 0910  NA 134* 133* 132*  K 4.0 3.7 3.7  CL 103 103 101  CO2 '22 22 23  '$ GLUCOSE 175* 116* 137*  BUN <5* 7 <5*  CALCIUM 8.4* 8.6* 8.6*  CREATININE 0.61 0.67 0.63  GFRNONAA >60 >60 >60  GFRAA >60 >60 >60    LIVER FUNCTION TESTS:  Recent Labs  08/17/15 1734  BILITOT 0.6  AST 27  ALT 18  ALKPHOS 93  PROT 6.4*  ALBUMIN 3.0*    Assessment and Plan:  For US guided biopsy of right supraclavicular lymph node today.  Risks and Benefits discussed with the patient including, but not limited to bleeding, infection, damage to adjacent structures or low yield requiring additional tests. All of the patient's questions were answered, patient is agreeable to proceed. Consent signed and in chart.  Thank you for this interesting consult.  I greatly enjoyed meeting Philip [name] and look forward to participating in their care.  A  copy of this report was sent to the requesting provider on this date.  Electronically SignedAletta Edouard T 07/19/2016, 4:18 PM   I spent a total of 15 Minutes in face to face in clinical consultation, greater than 50% of which was counseling/coordinating care for lymph node biopsy.

## 2016-07-19 NOTE — Telephone Encounter (Signed)
Wife reports that patient is still weak and voice continues to be weak. Instructed to call cancer center with needs, voiced understanding.

## 2016-07-23 ENCOUNTER — Encounter: Payer: Self-pay | Admitting: Nurse Practitioner

## 2016-07-23 ENCOUNTER — Ambulatory Visit (INDEPENDENT_AMBULATORY_CARE_PROVIDER_SITE_OTHER): Payer: Self-pay | Admitting: Nurse Practitioner

## 2016-07-23 VITALS — BP 110/68 | HR 71 | Ht 71.0 in | Wt 152.8 lb

## 2016-07-23 DIAGNOSIS — I10 Essential (primary) hypertension: Secondary | ICD-10-CM

## 2016-07-23 DIAGNOSIS — R0781 Pleurodynia: Secondary | ICD-10-CM

## 2016-07-23 DIAGNOSIS — I25119 Atherosclerotic heart disease of native coronary artery with unspecified angina pectoris: Secondary | ICD-10-CM

## 2016-07-23 DIAGNOSIS — E782 Mixed hyperlipidemia: Secondary | ICD-10-CM

## 2016-07-23 NOTE — Patient Instructions (Signed)
Medication Instructions:  Your physician recommends that you continue on your current medications as directed. Please refer to the Current Medication list given to you today.   Labwork: none  Testing/Procedures: none  Follow-Up: Your physician recommends that you schedule a follow-up appointment in: [place].  If you need a refill on your cardiac medications before your next appointment, please call your pharmacy.

## 2016-07-23 NOTE — Progress Notes (Signed)
Office Visit    Patient Name: Philip [name] Date of Encounter: 07/23/2016  Primary Care Provider:  [name], MD Primary Cardiologist:  Jerilynn Mages. Fletcher Anon, MD   Chief Complaint    56 show male male with prior history of CAD status post coronary artery bypass grafting in 2016, hypertension, hyperlipidemia, peripheral arterial disease status post left popliteal stenting, tobacco abuse, and recent diagnosis of lung cancer, who presents for follow-up related to pleuritic chest pain.  Past Medical History    Past Medical History:  Diagnosis Date  . Anemia   . CAD (coronary artery disease)    a. 08/2014 Inf STEMI/CABG x 3 (LIMA->LAD, VG->Diag, RIMA->RCA).  . DVT, recurrent, lower extremity, acute, left ([place]) 2016  . Hyperlipidemia   . Hypertension   . Hypertensive heart disease   . Lung cancer ([place])   . PAD (peripheral artery disease) ([place])    a. 03/2015 Periph Angio: short occlusion of L pop w/ evidence of embolization into the DP->5.0x50 mm Inova self-expanding stent;  b. 04/08/2015 ABI: R 1.12, L 0.99.  . Tobacco abuse   . Toe amputation status, left [facility]) 07/09/2016   2017   Past Surgical History:  Procedure Laterality Date  . CARDIAC CATHETERIZATION N/A 08/24/2014   Procedure: Left Heart Cath and Coronary Angiography;  Surgeon: Isaias Cowman, MD;  Location: [place] CV LAB;  Service: Cardiovascular;  Laterality: N/A;  . CARDIAC CATHETERIZATION N/A 08/24/2014   Procedure: Coronary Stent Intervention;  Surgeon: Isaias Cowman, MD;  Location: [place] CV LAB;  Service: Cardiovascular;  Laterality: N/A;  . CORONARY ARTERY BYPASS GRAFT N/A 08/25/2014   Procedure: CORONARY ARTERY BYPASS GRAFTING (CABG), ON PUMP, TIMES THREE, USING BILATERAL MAMMARY ARTERIES, RIGHT GREATER SAPHENOUS VEIN HARVESTED ENDOSCOPICALLY;  Surgeon: Melrose Nakayama, MD;  Location: [place];  Service: Open Heart Surgery;  Laterality: N/A;  . PERIPHERAL VASCULAR CATHETERIZATION N/A 03/30/2015   Procedure: Abdominal Aortogram w/Lower Extremity;  Surgeon: Wellington Hampshire, MD;  Location: [place] CV LAB;  Service: Cardiovascular;  Laterality: N/A;  . TEE WITHOUT CARDIOVERSION N/A 08/25/2014   Procedure: TRANSESOPHAGEAL ECHOCARDIOGRAM (TEE);  Surgeon: Melrose Nakayama, MD;  Location: [place];  Service: Open Heart Surgery;  Laterality: N/A;    Allergies  Allergies  Allergen Reactions  . Amitriptyline Other (See Comments)    Burning sensation all over  . Contrast Media [Iodinated Diagnostic Agents] Other (See Comments)    Pt states that he got "knots behind his ears".  . Nsaids Other (See Comments)    Other reaction(s): Other (See Comments) Reaction:  Blood in stool  Reaction:  Blood in stool   . Ibuprofen Other (See Comments) and Nausea Only    Blood in stools    History of Present Illness    56 year old male with above compact past medical history including coronary artery disease status post inferior ST segment elevation myocardial infarction in May 2016 with catheterization revealing severe multivessel coronary artery disease. He subsequently underwent successful CABG 3. Other history includes hypertension, hyperlipidemia, peripheral arterial disease status post left popliteal stenting, and tobacco abuse. He was recently evaluated in the emergency department in March for an abscess on his left wrist and also pleuritic chest discomfort. Chest x-ray showed left hilar fullness and subsequent CT scan showed extensive adenopathy and a 4 x 3 nodular opacity in the anterior segment of the left upper lobe. He was subsequently referred to oncology and has since undergone PET scanning which revealed central left upper lobe/suprahilar primary bronchogenic  carcinoma. There were notable metastases within the chest and supraclavicular/lower jugular regions. Also multifocal osseous metastases and possibly a pathologic fracture involving the posterior medial right 10th rib. There is also concern  for left lower lobe infection or postobstructive pneumonitis. He was given a prescription for Levaquin on March 29 as he was having productive coughing. He says that he continued to cough but this did improve some with antibiotic therapy. He has not been having any fevers or chills. He continues to have a mild constant chest tightness that is worse with coughing and deep breathing. Chest tightness did not seem to worsen with exertion and is not necessarily associated with dyspnea. He denies PND, orthopnea, dizziness, syncope, edema, or early satiety.  Home Medications    Prior to Admission medications   Medication Sig Start Date End Date Taking? Authorizing Provider  atorvastatin (LIPITOR) 40 MG tablet Take 1 tablet (40 mg total) by mouth daily. 05/19/15  Yes Rogelia Mire, NP  clopidogrel (PLAVIX) 75 MG tablet Take 75 mg by mouth daily.   Yes Historical Provider, MD  diphenhydrAMINE (BENADRYL) 25 mg capsule Take 25 mg by mouth 2 (two) times daily as needed for allergies.    Yes Historical Provider, MD  fluticasone (FLONASE) 50 MCG/ACT nasal spray Place 1 spray into both nostrils daily. Patient taking differently: Place 1 spray into both nostrils daily as needed for allergies.  09/14/14 07/23/16 Yes Tasrif Ahmed, MD  lisinopril (PRINIVIL,ZESTRIL) 5 MG tablet TAKE 1 TABLET BY MOUTH DAILY 06/11/16  Yes Rogelia Mire, NP  metoprolol tartrate (LOPRESSOR) 25 MG tablet Take 1 tablet (25 mg total) by mouth 2 (two) times daily. 07/22/15  Yes Minna Merritts, MD  omeprazole (PRILOSEC) 20 MG capsule Take 20 mg by mouth daily.   Yes Historical Provider, MD    Review of Systems    Pleuritic chest pain as outlined above. He has also had ongoing cough with some degree of congestion and mucus.  All other systems reviewed and are otherwise negative except as noted above.  Physical Exam    VS:  BP 110/68 (BP Location: Left Arm, Patient Position: Sitting, Cuff Size: Normal)   Pulse 71   Ht '5\' 11"'$  (1.803  m)   Wt 152 lb 12 oz (69.3 kg)   BMI 21.30 kg/m  , BMI Body mass index is 21.3 kg/m. GEN: Well nourished, well developed, in no acute distress.  HEENT: normal.  Neck: Supple, no JVD, carotid bruits, or masses. Cardiac: RRR, no murmurs, rubs, or gallops. No clubbing, cyanosis, edema.  Radials/DP/PT 2+ and equal bilaterally.  Respiratory:  Respirations regular and unlabored, diminished breath sounds bilaterally.  GI: Soft, nontender, nondistended, BS + x 4. MS: no deformity or atrophy. Skin: warm and dry, no rash. Neuro:  Strength and sensation are intact. Psych: Normal affect.  Accessory Clinical Findings    ECG - Regular sinus rhythm, 71, no acute ST or T changes.  Assessment & Plan    1.  Pleuritic chest pain/coronary artery disease: Patient has a prior history of CAD and is status post coronary artery bypass grafting 3 in 2016 following inferior STEMI. Over the past month, he has had cough and pleuritic chest pain that is worse with cough and deep breathing. He has some degree of mild chest tightness at baseline. He was recently evaluated in the emergency department with normal troponins and ECG. I suspect chest pain is noncardiac and more likely related to recent pneumonia/pneumonitis with questionable role of pathologic rib  fracture. We did discuss the potential role of stress testing, especially as he may require preoperative cardiac clearance for future therapy of lung cancer. He does not have insurance and given pleuritic symptoms with unclear past for related to his lung cancer, we will hold off on stress testing for now. If it is felt that surgery is in his future, we can arrange for a Lexiscan Myoview.  Continue statin, Plavix, beta blocker, and ACE inhibitor. Of note, aspirin and Plavix were discontinued last summer in the setting of bleeding. He has resumed Plavix on his own and is tolerating.  2. Lung cancer: Now followed closely by oncology. He has follow-up on Wednesday.  3.  Essential hypertension: Blood pressure stable. Next  4. Hyperlipidemia: He remains on statin therapy. We do not have lipids on file.  5. Peripheral arterial disease: No recent claudication. Remains on Plavix and statin therapy.  6. Disposition: Follow-up with Dr. Fletcher Anon in 3 months or sooner if necessary.  Murray Hodgkins, NP 07/23/2016, 3:51 PM

## 2016-07-24 ENCOUNTER — Other Ambulatory Visit: Payer: Self-pay | Admitting: Pathology

## 2016-07-24 ENCOUNTER — Telehealth: Payer: Self-pay | Admitting: *Deleted

## 2016-07-24 LAB — SURGICAL PATHOLOGY

## 2016-07-24 NOTE — Telephone Encounter (Signed)
Dr Newman Nip called to notify Dr. Mike Gip of right super clavicular lymph node biopsy pathology of metastatic small cell carcinoma lung. Dr Mike Gip notified.

## 2016-07-25 ENCOUNTER — Encounter: Payer: Self-pay | Admitting: *Deleted

## 2016-07-25 ENCOUNTER — Inpatient Hospital Stay (HOSPITAL_BASED_OUTPATIENT_CLINIC_OR_DEPARTMENT_OTHER): Payer: Medicaid Other | Admitting: Hematology and Oncology

## 2016-07-25 ENCOUNTER — Encounter: Payer: Self-pay | Admitting: Hematology and Oncology

## 2016-07-25 ENCOUNTER — Other Ambulatory Visit: Payer: Self-pay | Admitting: *Deleted

## 2016-07-25 VITALS — BP 127/90 | HR 114 | Temp 98.0°F | Resp 18 | Wt 152.6 lb

## 2016-07-25 DIAGNOSIS — Z8701 Personal history of pneumonia (recurrent): Secondary | ICD-10-CM

## 2016-07-25 DIAGNOSIS — C3412 Malignant neoplasm of upper lobe, left bronchus or lung: Secondary | ICD-10-CM | POA: Diagnosis present

## 2016-07-25 DIAGNOSIS — I252 Old myocardial infarction: Secondary | ICD-10-CM

## 2016-07-25 DIAGNOSIS — Q214 Aortopulmonary septal defect: Secondary | ICD-10-CM

## 2016-07-25 DIAGNOSIS — I639 Cerebral infarction, unspecified: Principal | ICD-10-CM | POA: Diagnosis present

## 2016-07-25 DIAGNOSIS — C7951 Secondary malignant neoplasm of bone: Secondary | ICD-10-CM | POA: Diagnosis present

## 2016-07-25 DIAGNOSIS — E785 Hyperlipidemia, unspecified: Secondary | ICD-10-CM

## 2016-07-25 DIAGNOSIS — I739 Peripheral vascular disease, unspecified: Secondary | ICD-10-CM

## 2016-07-25 DIAGNOSIS — Z91041 Radiographic dye allergy status: Secondary | ICD-10-CM

## 2016-07-25 DIAGNOSIS — R059 Cough, unspecified: Secondary | ICD-10-CM

## 2016-07-25 DIAGNOSIS — I119 Hypertensive heart disease without heart failure: Secondary | ICD-10-CM | POA: Diagnosis present

## 2016-07-25 DIAGNOSIS — E7800 Pure hypercholesterolemia, unspecified: Secondary | ICD-10-CM | POA: Diagnosis present

## 2016-07-25 DIAGNOSIS — G893 Neoplasm related pain (acute) (chronic): Secondary | ICD-10-CM

## 2016-07-25 DIAGNOSIS — Z803 Family history of malignant neoplasm of breast: Secondary | ICD-10-CM

## 2016-07-25 DIAGNOSIS — Z951 Presence of aortocoronary bypass graft: Secondary | ICD-10-CM

## 2016-07-25 DIAGNOSIS — R05 Cough: Secondary | ICD-10-CM

## 2016-07-25 DIAGNOSIS — I2510 Atherosclerotic heart disease of native coronary artery without angina pectoris: Secondary | ICD-10-CM | POA: Diagnosis present

## 2016-07-25 DIAGNOSIS — Z89422 Acquired absence of other left toe(s): Secondary | ICD-10-CM

## 2016-07-25 DIAGNOSIS — C3490 Malignant neoplasm of unspecified part of unspecified bronchus or lung: Secondary | ICD-10-CM

## 2016-07-25 DIAGNOSIS — I129 Hypertensive chronic kidney disease with stage 1 through stage 4 chronic kidney disease, or unspecified chronic kidney disease: Secondary | ICD-10-CM

## 2016-07-25 DIAGNOSIS — C778 Secondary and unspecified malignant neoplasm of lymph nodes of multiple regions: Secondary | ICD-10-CM

## 2016-07-25 DIAGNOSIS — Z7951 Long term (current) use of inhaled steroids: Secondary | ICD-10-CM

## 2016-07-25 DIAGNOSIS — Z86718 Personal history of other venous thrombosis and embolism: Secondary | ICD-10-CM

## 2016-07-25 DIAGNOSIS — Z800 Family history of malignant neoplasm of digestive organs: Secondary | ICD-10-CM

## 2016-07-25 DIAGNOSIS — Z7952 Long term (current) use of systemic steroids: Secondary | ICD-10-CM

## 2016-07-25 DIAGNOSIS — Z8249 Family history of ischemic heart disease and other diseases of the circulatory system: Secondary | ICD-10-CM

## 2016-07-25 DIAGNOSIS — Z7189 Other specified counseling: Secondary | ICD-10-CM

## 2016-07-25 DIAGNOSIS — E871 Hypo-osmolality and hyponatremia: Secondary | ICD-10-CM | POA: Diagnosis present

## 2016-07-25 DIAGNOSIS — Z801 Family history of malignant neoplasm of trachea, bronchus and lung: Secondary | ICD-10-CM

## 2016-07-25 DIAGNOSIS — Z886 Allergy status to analgesic agent status: Secondary | ICD-10-CM

## 2016-07-25 DIAGNOSIS — F17210 Nicotine dependence, cigarettes, uncomplicated: Secondary | ICD-10-CM

## 2016-07-25 DIAGNOSIS — M549 Dorsalgia, unspecified: Secondary | ICD-10-CM

## 2016-07-25 DIAGNOSIS — Z7902 Long term (current) use of antithrombotics/antiplatelets: Secondary | ICD-10-CM

## 2016-07-25 DIAGNOSIS — Z79899 Other long term (current) drug therapy: Secondary | ICD-10-CM

## 2016-07-25 DIAGNOSIS — Z888 Allergy status to other drugs, medicaments and biological substances status: Secondary | ICD-10-CM

## 2016-07-25 DIAGNOSIS — D649 Anemia, unspecified: Secondary | ICD-10-CM

## 2016-07-25 DIAGNOSIS — Z7689 Persons encountering health services in other specified circumstances: Secondary | ICD-10-CM

## 2016-07-25 DIAGNOSIS — R590 Localized enlarged lymph nodes: Secondary | ICD-10-CM

## 2016-07-25 MED ORDER — HYDROCODONE-ACETAMINOPHEN 5-325 MG PO TABS: 1.0000 | ORAL_TABLET | Freq: Four times a day (QID) | ORAL | 0 refills | Status: DC | PRN

## 2016-07-25 MED ORDER — HYDROCOD POLST-CPM POLST ER 10-8 MG/5ML PO SUER: 5.0000 mL | Freq: Two times a day (BID) | ORAL | 0 refills | Status: DC | PRN

## 2016-07-25 NOTE — Progress Notes (Signed)
Patient has finished antibiotics.  Since that time he states his cough is so bad it interferes with his ability to sleep and eat. States he has felt bad for the past several days.  States he hurts in his upper back between his shoulders.  States the pain sometimes "takes him to his knees".

## 2016-07-25 NOTE — Progress Notes (Addendum)
[place] [facility] day:  07/25/2016   Chief Complaint: Philip [name] is a 56 y.o. male with extensive thoracic lymphadenopathy who is seen for review of interval biopsy and discussion regarding direction of therapy.  HPI:  The patient was last seen in the medical oncology clinic on 07/12/2016.  At that time, PET scan was reviewed.  Imaging revealed central left upper lobe/suprahilar mass with nodal metastasis within the chest and supraclavicular regions. There was multifocal osseous metastasis.  There was lower lobe opacity specialist for postobstructive pneumonitis. He was treated with Levaquin.  A supraclavicular lymph node biopsy was scheduled.  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  Immunohistochemistry was positive for CD56, TTF1, and negative for p40.  Symptomatically, he notes his cough is mostly clear.   He describes a sharp pain between his shoulder blades. He has a slightly runny nose. He denies any fevers.   Past Medical History:  Diagnosis Date  . Anemia   . CAD (coronary artery disease)    a. 08/2014 Inf STEMI/CABG x 3 (LIMA->LAD, VG->Diag, RIMA->RCA).  . DVT, recurrent, lower extremity, acute, left ([place]) 2016  . Hyperlipidemia   . Hypertension   . Hypertensive heart disease   . Lung cancer ([place])   . PAD (peripheral artery disease) ([place])    a. 03/2015 Periph Angio: short occlusion of L pop w/ evidence of embolization into the DP->5.0x50 mm Inova self-expanding stent;  b. 04/08/2015 ABI: R 1.12, L 0.99.  . Tobacco abuse   . Toe amputation status, left [facility]) 07/09/2016   2017    Past Surgical History:  Procedure Laterality Date  . CARDIAC CATHETERIZATION N/A 08/24/2014   Procedure: Left Heart Cath and Coronary Angiography;  Surgeon: Isaias Cowman, MD;  Location: [place] CV LAB;  Service: Cardiovascular;  Laterality: N/A;  . CARDIAC CATHETERIZATION N/A  08/24/2014   Procedure: Coronary Stent Intervention;  Surgeon: Isaias Cowman, MD;  Location: [place] CV LAB;  Service: Cardiovascular;  Laterality: N/A;  . CORONARY ARTERY BYPASS GRAFT N/A 08/25/2014   Procedure: CORONARY ARTERY BYPASS GRAFTING (CABG), ON PUMP, TIMES THREE, USING BILATERAL MAMMARY ARTERIES, RIGHT GREATER SAPHENOUS VEIN HARVESTED ENDOSCOPICALLY;  Surgeon: Melrose Nakayama, MD;  Location: [place];  Service: Open Heart Surgery;  Laterality: N/A;  . PERIPHERAL VASCULAR CATHETERIZATION N/A 03/30/2015   Procedure: Abdominal Aortogram w/Lower Extremity;  Surgeon: Wellington Hampshire, MD;  Location: [place] CV LAB;  Service: Cardiovascular;  Laterality: N/A;  . TEE WITHOUT CARDIOVERSION N/A 08/25/2014   Procedure: TRANSESOPHAGEAL ECHOCARDIOGRAM (TEE);  Surgeon: Melrose Nakayama, MD;  Location: [place];  Service: Open Heart Surgery;  Laterality: N/A;    Family History  Problem Relation Age of Onset  . Hypertension Father   . Lung cancer Father   . Heart attack Mother 35    STENT  . Hyperlipidemia Mother   . Heart attack Brother   . Heart attack Brother   . Breast cancer Sister   . Heart attack Sister   . Colon cancer Paternal Grandfather     Social History:  reports that he has been smoking Cigarettes.  He has been smoking about 0.50 packs per day. He has never used smokeless tobacco. He reports that he drinks alcohol. He reports that he does not use drugs.  The patient smokes 1/2 pack of cigarettes/day (previously 2 1/2 ppd).  He started smoking in his teens.  He denies any exposure to  radiation or toxins.  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  He lives in [place].  The patient is accompanied by his wife, Mariann Laster, today.  Allergies:  Allergies  Allergen Reactions  . Amitriptyline Other (See Comments)    Burning sensation all over  . Contrast Media [Iodinated Diagnostic Agents] Other (See Comments)    Pt states that he got "knots behind his  ears".  . Nsaids Other (See Comments)    Other reaction(s): Other (See Comments) Reaction:  Blood in stool  Reaction:  Blood in stool   . Ibuprofen Other (See Comments) and Nausea Only    Blood in stools    Current Medications: Current Outpatient Prescriptions  Medication Sig Dispense Refill  . atorvastatin (LIPITOR) 40 MG tablet Take 1 tablet (40 mg total) by mouth daily. 90 tablet 3  . clopidogrel (PLAVIX) 75 MG tablet Take 75 mg by mouth daily.    . diphenhydrAMINE (BENADRYL) 25 mg capsule Take 25 mg by mouth 2 (two) times daily as needed for allergies.     Marland Kitchen lisinopril (PRINIVIL,ZESTRIL) 5 MG tablet TAKE 1 TABLET BY MOUTH DAILY 90 tablet 1  . metoprolol tartrate (LOPRESSOR) 25 MG tablet Take 1 tablet (25 mg total) by mouth 2 (two) times daily. 180 tablet 3  . omeprazole (PRILOSEC) 20 MG capsule Take 20 mg by mouth daily.    . fluticasone (FLONASE) 50 MCG/ACT nasal spray Place 1 spray into both nostrils daily. (Patient taking differently: Place 1 spray into both nostrils daily as needed for allergies. ) 16 g 2   No current facility-administered medications for this visit.     Review of Systems:  GENERAL:  Fatigue.  Night sweats.  Weight loss of 36 pounds (previously 190 pounds).  Weight loss of 3 pounds since last visit. PERFORMANCE STATUS (ECOG):  1 HEENT:  Runny nose.  No visual changes, mouth sores or tenderness. Lungs:  No shortness of breath.   Cough.  Sputum clear.  No hemoptysis. Cardiac:  Chest pain.  No palpitations, orthopnea, or PND. GI:  Appetite affected by coughing.  No nausea, vomiting, diarrhea, constipation, melena or hematochezia.  Colonoscopy 30 years ago.   GU:  No urgency, frequency, dysuria, or hematuria. Musculoskeletal:  Pain in upper back.  No joint pain.  No muscle tenderness. Extremities:  No pain or swelling. Left toe removed (old). Skin:  No rashes or skin changes. Neuro:  No headache, numbness or weakness, balance or coordination issues. Endocrine:   No diabetes, thyroid issues, hot flashes or night sweats. Psych:  No mood changes, depression or anxiety.  Cough affecting sleep.   Pain:  Upper back pain (6 out of 10). Review of systems:  All other systems reviewed and found to be negative.  Physical Exam: Blood pressure 127/90, pulse (!) 114, temperature 98 F (36.7 C), temperature source Tympanic, resp. rate 18, weight 152 lb 8.9 oz (69.2 kg). GENERAL:  Well developed, well nourished, gentleman sitting comfortably in the exam room in no acute distress. MENTAL STATUS:  Alert and oriented to person, place and time. HEAD:  Black hair with graying.  Scruffy beard.  Normocephalic, atraumatic, face symmetric, no Cushingoid features. EYES:  Brown eyes.  No conjunctivitis or scleral icterus. RESPIRATORY:  Clear to auscultation without rales, wheezes or rhonchi. CARDIOVASCULAR:  Regular rate and rhythm without murmur EXTREMITIES: No lower extremity edema, no skin discoloration or tenderness.  No palpable cords. NEUROLOGICAL: Unremarkable. PSYCH:  Appropriate.   No visits with results  within 3 Day(s) from this visit.  Latest known visit with results is:  Hospital Outpatient Visit on 07/19/2016  Component Date Value Ref Range Status  . aPTT 07/19/2016 34  24 - 36 seconds Final  . WBC 07/19/2016 4.7  3.8 - 10.6 K/uL Final  . RBC 07/19/2016 4.26* 4.40 - 5.90 MIL/uL Final  . Hemoglobin 07/19/2016 14.1  13.0 - 18.0 g/dL Final  . HCT 07/19/2016 39.8* 40.0 - 52.0 % Final  . MCV 07/19/2016 93.6  80.0 - 100.0 fL Final  . MCH 07/19/2016 33.2  26.0 - 34.0 pg Final  . MCHC 07/19/2016 35.5  32.0 - 36.0 g/dL Final  . RDW 07/19/2016 14.1  11.5 - 14.5 % Final  . Platelets 07/19/2016 208  150 - 440 K/uL Final  . Prothrombin Time 07/19/2016 12.0  11.4 - 15.2 seconds Final  . INR 07/19/2016 0.89   Final  . SURGICAL PATHOLOGY 07/19/2016    Final                   Value:Surgical Pathology CASE: [id number] PATIENT: Philip [name] Surgical Pathology  Report     SPECIMEN SUBMITTED: A. Lymph node, right supraclavicular  CLINICAL HISTORY: Left suprahilar lung mass and lymphadenopathy  PRE-OPERATIVE DIAGNOSIS: Metastatic lung carcinoma; less likely lymphoma  POST-OPERATIVE DIAGNOSIS: Same as pre-op     DIAGNOSIS: A. LYMPH NODE, RIGHT SUPRACLAVICULAR; ULTRASOUND GUIDED BIOPSY: - METASTATIC SMALL CELL CARCINOMA OF THE LUNG.  Comment: A limited panel of immunohistochemical stains was performed with the following pattern of immunoreactivity: CD56: Positive TTF1: Positive: p40: Negative This pattern of immunoreactivity supports the above diagnosis. These results were communicated to [facility] in Dr. Kem Parkinson office on 07/24/2016. Read back procedure performed. Stain controls worked appropriately.  GROSS DESCRIPTION:  A1. Labeled: right supraclavicular node (fresh)  Tissue fragment(s): multiple  Size: aggregate, 1.2 x                          0.1 x 0.1 cm  Description: tan to brown fragment fresh, touch prep prepared with Diff Quik stained slide, following entirely formalin fixed (Dr. Bryan Lemma), wrapped in lens paper marked orange and submitted in a mesh bag  Entirely submitted in  cassette(s) 1.   A2. Labeled: right supraclavicular node (in formalin)  Tissue fragment(s): multiple  Size: aggregate, 2.8 x 0.2 x 0.1 cm  Description: tan-brown cores, wrapped in lens paper marked orange and entirely submitted in a mesh bag  Entirely submitted in cassette(s) 2-3.    Final Diagnosis performed by Quay Burow, MD.  Electronically signed 07/24/2016 12:38:29PM    The electronic signature indicates that the named Attending Pathologist has evaluated the specimen  Technical component performed at [facility], [address] [place], [place] [address] Lab: [phone] Dir: Darrick Penna. Evette Doffing, MD  Professional component performed at [facility], [facility], [place], [place],                           [place] [address] Lab: [phone] Dir: Dellia Nims. Reuel Derby, MD      Assessment:  MCDANIEL OHMS is a 56 y.o. male with extensive stage small cell lung cancer.  He presented with a 2 year history of night sweats and a 36 pound weight loss in 6 months.  Right supraclavicular biopsy on 07/19/2016 revealed small cell carcinoma of the lung.  Chest CT on 06/28/2016 revealed extensive adenopathy.  There were multiple lymph  nodes descending thoracic aorta, largest measuring 2.1 x 1.7 cm.  There was adenopathy in the aortopulmonary window with the largest confluence of lymph nodes measuring 4 x 2.6 cm. There was hilar adenopathy on the left measuring 2 x 1.9 cm. There were multiple enlarged lymph nodes in the pretracheal region, largest 1.7 x 1.7 cm.  There were lymph nodes to the left of the carina measuring 2.8 x 1.9 cm. There was a subcarinal lymph node measuring 2.4 x 1.6 cm. There was a 4 x 3 mm nodular opacity in the anterior segment of the left upper lobe.    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  There was nodal metastasis within the chest and supraclavicular/low jugular regions.  There was multifocal osseous metastasis.  There was possibly a pathologic fracture involving the posteromedial right tenth rib.  There was new left lower lobe opacity with mild hypermetabolism, suspicious for infection or postobstructive pneumonitis.  He completed a course of Levaquin for a post-obstructive pneumonia.  He has a history of left lower extremity DVT a few months after his cardiac surgery in 08/2014.  He has peripheral vascular disease s/p stent placement in 03/2015.  He is on a Plavix.  He has not had a colonoscopy in 30 years.  Symptomatically, he has a chronic cough.  He has upper back pain.  Exam reveals no adenopathy.  Plan: 1.  Review pathology.  Discuss extensive stage small cell lung cancer.  Discuss treatment is palliative.  Discuss  4-6 cycles of carboplatin and etoposide.  Side effects reviewed in detail including myelosuppression, hair loss, nausea and vomiting, liver function abnormalities, and electrolyte wasting.  Discuss hypotension associated with etoposide as well as small future risk of leukemia.  Discuss use of Neulasta and side effects.  Discuss Claritin to prevent Neulasta induced bone pain. Discuss chemotherapy class.   Discuss port placement.  Discuss baseline head MRI.  Discuss Xgeva to prevent bone related events. 2.  Schedule baseline head MRI. 3.  Schedule port placement 4.  Preauth carboplatin and etoposide. 5.  Preauth Xgeva 6.  CXR (PA and lateral) 7.  Rx:  hydrocodone 5/acetaminophen 325 1 tablet po q 6 hours prn pain (dis: # 40). 8.  Rx:  chorpheniramine-hydrocodone  10-8 mg/5 ml 5 ml po q 12 hours prn cough (dis: 140 ml). 9.  RTC on 07/31/2016 for MD assessment, labs (CBC with diff, CMP, Mg, CEA) and cycle #1 carboplatin and etoposide   Lequita Asal, MD  07/25/2016, 4:15 PM

## 2016-07-26 ENCOUNTER — Telehealth: Payer: Self-pay | Admitting: *Deleted

## 2016-07-26 ENCOUNTER — Ambulatory Visit
Admission: RE | Admit: 2016-07-26 | Discharge: 2016-07-26 | Disposition: A | Discharge status: Home / Self Care | Payer: Medicaid Other | Source: Ambulatory Visit | Attending: Hematology and Oncology | Admitting: Hematology and Oncology

## 2016-07-26 ENCOUNTER — Inpatient Hospital Stay
Admission: EM | Admit: 2016-07-26 | Discharge: 2016-07-27 | DRG: 065 | Disposition: A | Discharge status: Home / Self Care | Payer: Medicaid Other | Attending: Internal Medicine | Admitting: Internal Medicine

## 2016-07-26 ENCOUNTER — Other Ambulatory Visit: Payer: Self-pay

## 2016-07-26 ENCOUNTER — Other Ambulatory Visit (INDEPENDENT_AMBULATORY_CARE_PROVIDER_SITE_OTHER): Payer: Self-pay | Admitting: Vascular Surgery

## 2016-07-26 ENCOUNTER — Encounter: Payer: Self-pay | Admitting: Emergency Medicine

## 2016-07-26 DIAGNOSIS — I252 Old myocardial infarction: Secondary | ICD-10-CM

## 2016-07-26 DIAGNOSIS — Z951 Presence of aortocoronary bypass graft: Secondary | ICD-10-CM | POA: Diagnosis not present

## 2016-07-26 DIAGNOSIS — I998 Other disorder of circulatory system: Secondary | ICD-10-CM

## 2016-07-26 DIAGNOSIS — C3490 Malignant neoplasm of unspecified part of unspecified bronchus or lung: Secondary | ICD-10-CM

## 2016-07-26 DIAGNOSIS — D509 Iron deficiency anemia, unspecified: Secondary | ICD-10-CM

## 2016-07-26 DIAGNOSIS — Z8673 Personal history of transient ischemic attack (TIA), and cerebral infarction without residual deficits: Secondary | ICD-10-CM

## 2016-07-26 DIAGNOSIS — R590 Localized enlarged lymph nodes: Secondary | ICD-10-CM

## 2016-07-26 DIAGNOSIS — J9811 Atelectasis: Secondary | ICD-10-CM | POA: Insufficient documentation

## 2016-07-26 DIAGNOSIS — I119 Hypertensive heart disease without heart failure: Secondary | ICD-10-CM | POA: Diagnosis present

## 2016-07-26 DIAGNOSIS — E7800 Pure hypercholesterolemia, unspecified: Secondary | ICD-10-CM | POA: Diagnosis present

## 2016-07-26 DIAGNOSIS — Z800 Family history of malignant neoplasm of digestive organs: Secondary | ICD-10-CM

## 2016-07-26 DIAGNOSIS — I10 Essential (primary) hypertension: Secondary | ICD-10-CM

## 2016-07-26 DIAGNOSIS — Z7952 Long term (current) use of systemic steroids: Secondary | ICD-10-CM | POA: Diagnosis not present

## 2016-07-26 DIAGNOSIS — Z7902 Long term (current) use of antithrombotics/antiplatelets: Secondary | ICD-10-CM | POA: Diagnosis not present

## 2016-07-26 DIAGNOSIS — Z801 Family history of malignant neoplasm of trachea, bronchus and lung: Secondary | ICD-10-CM | POA: Diagnosis not present

## 2016-07-26 DIAGNOSIS — C7951 Secondary malignant neoplasm of bone: Secondary | ICD-10-CM

## 2016-07-26 DIAGNOSIS — F17210 Nicotine dependence, cigarettes, uncomplicated: Secondary | ICD-10-CM | POA: Diagnosis present

## 2016-07-26 DIAGNOSIS — E871 Hypo-osmolality and hyponatremia: Secondary | ICD-10-CM | POA: Diagnosis present

## 2016-07-26 DIAGNOSIS — Z886 Allergy status to analgesic agent status: Secondary | ICD-10-CM | POA: Diagnosis not present

## 2016-07-26 DIAGNOSIS — I639 Cerebral infarction, unspecified: Secondary | ICD-10-CM | POA: Diagnosis present

## 2016-07-26 DIAGNOSIS — R591 Generalized enlarged lymph nodes: Secondary | ICD-10-CM

## 2016-07-26 DIAGNOSIS — R630 Anorexia: Secondary | ICD-10-CM

## 2016-07-26 DIAGNOSIS — Z89422 Acquired absence of other left toe(s): Secondary | ICD-10-CM

## 2016-07-26 DIAGNOSIS — Z8249 Family history of ischemic heart disease and other diseases of the circulatory system: Secondary | ICD-10-CM | POA: Diagnosis not present

## 2016-07-26 DIAGNOSIS — R5383 Other fatigue: Secondary | ICD-10-CM

## 2016-07-26 DIAGNOSIS — R51 Headache: Secondary | ICD-10-CM | POA: Diagnosis present

## 2016-07-26 DIAGNOSIS — Z79899 Other long term (current) drug therapy: Secondary | ICD-10-CM

## 2016-07-26 DIAGNOSIS — E785 Hyperlipidemia, unspecified: Secondary | ICD-10-CM | POA: Diagnosis present

## 2016-07-26 DIAGNOSIS — Z91041 Radiographic dye allergy status: Secondary | ICD-10-CM | POA: Diagnosis not present

## 2016-07-26 DIAGNOSIS — Z7951 Long term (current) use of inhaled steroids: Secondary | ICD-10-CM | POA: Diagnosis not present

## 2016-07-26 DIAGNOSIS — Z888 Allergy status to other drugs, medicaments and biological substances status: Secondary | ICD-10-CM | POA: Diagnosis not present

## 2016-07-26 DIAGNOSIS — C3412 Malignant neoplasm of upper lobe, left bronchus or lung: Secondary | ICD-10-CM | POA: Diagnosis present

## 2016-07-26 DIAGNOSIS — J90 Pleural effusion, not elsewhere classified: Secondary | ICD-10-CM

## 2016-07-26 DIAGNOSIS — Z8701 Personal history of pneumonia (recurrent): Secondary | ICD-10-CM

## 2016-07-26 DIAGNOSIS — I2510 Atherosclerotic heart disease of native coronary artery without angina pectoris: Secondary | ICD-10-CM

## 2016-07-26 DIAGNOSIS — Z86718 Personal history of other venous thrombosis and embolism: Secondary | ICD-10-CM

## 2016-07-26 DIAGNOSIS — Z7189 Other specified counseling: Secondary | ICD-10-CM | POA: Insufficient documentation

## 2016-07-26 DIAGNOSIS — G629 Polyneuropathy, unspecified: Secondary | ICD-10-CM

## 2016-07-26 DIAGNOSIS — Z803 Family history of malignant neoplasm of breast: Secondary | ICD-10-CM | POA: Diagnosis not present

## 2016-07-26 DIAGNOSIS — I739 Peripheral vascular disease, unspecified: Secondary | ICD-10-CM | POA: Diagnosis present

## 2016-07-26 LAB — COMPREHENSIVE METABOLIC PANEL
ALBUMIN: 3.4 g/dL — AB (ref 3.5–5.0)
ALK PHOS: 72 U/L (ref 38–126)
ALT: 16 U/L — AB (ref 17–63)
AST: 26 U/L (ref 15–41)
Anion gap: 8 (ref 5–15)
BILIRUBIN TOTAL: 0.5 mg/dL (ref 0.3–1.2)
BUN: 8 mg/dL (ref 6–20)
CO2: 22 mmol/L (ref 22–32)
Calcium: 9 mg/dL (ref 8.9–10.3)
Chloride: 96 mmol/L — ABNORMAL LOW (ref 101–111)
Creatinine, Ser: 0.76 mg/dL (ref 0.61–1.24)
GFR calc Af Amer: >60 mL/min (ref >60)
GLUCOSE: 155 mg/dL — AB (ref 65–99)
POTASSIUM: 4.5 mmol/L (ref 3.5–5.1)
Sodium: 126 mmol/L — ABNORMAL LOW (ref 135–145)
Total Protein: 6.6 g/dL (ref 6.5–8.1)

## 2016-07-26 LAB — CBC
HEMATOCRIT: 40.2 % (ref 40.0–52.0)
Hemoglobin: 13.7 g/dL (ref 13.0–18.0)
MCH: 30.3 pg (ref 26.0–34.0)
MCHC: 34.1 g/dL (ref 32.0–36.0)
MCV: 88.8 fL (ref 80.0–100.0)
Platelets: 236 10*3/uL (ref 150–440)
RBC: 4.53 MIL/uL (ref 4.40–5.90)
RDW: 13.9 % (ref 11.5–14.5)
WBC: 3.6 10*3/uL — ABNORMAL LOW (ref 3.8–10.6)

## 2016-07-26 LAB — PROTIME-INR
INR: 0.95
Prothrombin Time: 12.7 seconds (ref 11.4–15.2)

## 2016-07-26 LAB — URINALYSIS, COMPLETE (UACMP) WITH MICROSCOPIC
BILIRUBIN URINE: NEGATIVE
Bacteria, UA: NONE SEEN
Glucose, UA: NEGATIVE mg/dL
HGB URINE DIPSTICK: NEGATIVE
KETONES UR: NEGATIVE mg/dL
LEUKOCYTES UA: NEGATIVE
NITRITE: NEGATIVE
PH: 6 (ref 5.0–8.0)
Protein, ur: NEGATIVE mg/dL
RBC / HPF: NONE SEEN RBC/hpf (ref 0–5)
Specific Gravity, Urine: 1.006 (ref 1.005–1.030)
Squamous Epithelial / LPF: NONE SEEN

## 2016-07-26 LAB — URINE DRUG SCREEN, QUALITATIVE (ARMC ONLY)
Amphetamines, Ur Screen: NOT DETECTED
BARBITURATES, UR SCREEN: NOT DETECTED
Benzodiazepine, Ur Scrn: NOT DETECTED
COCAINE METABOLITE, UR ~~LOC~~: NOT DETECTED
Cannabinoid 50 Ng, Ur ~~LOC~~: POSITIVE — AB
MDMA (ECSTASY) UR SCREEN: NOT DETECTED
METHADONE SCREEN, URINE: NOT DETECTED
Opiate, Ur Screen: POSITIVE — AB
Phencyclidine (PCP) Ur S: NOT DETECTED
TRICYCLIC, UR SCREEN: NOT DETECTED

## 2016-07-26 LAB — TROPONIN I: Troponin I: <0.03 ng/mL (ref <0.03)

## 2016-07-26 LAB — DIFFERENTIAL
BASOS ABS: 0 10*3/uL (ref 0–0.1)
Basophils Relative: 0 %
EOS ABS: 0 10*3/uL (ref 0–0.7)
Eosinophils Relative: 0 %
LYMPHS ABS: 0.4 10*3/uL — AB (ref 1.0–3.6)
Lymphocytes Relative: 11 %
MONOS PCT: 2 %
Monocytes Absolute: 0.1 10*3/uL — ABNORMAL LOW (ref 0.2–1.0)
NEUTROS ABS: 3.1 10*3/uL (ref 1.4–6.5)
NEUTROS PCT: 87 %

## 2016-07-26 LAB — APTT: APTT: 34 s (ref 24–36)

## 2016-07-26 MED ORDER — HYDROCODONE-ACETAMINOPHEN 5-325 MG PO TABS
1.0000 | ORAL_TABLET | Freq: Four times a day (QID) | ORAL | Status: DC | PRN
  Administered 2016-07-26 (×2): 1 via ORAL
  Filled 2016-07-26 (×2): qty 1

## 2016-07-26 MED ORDER — METOPROLOL TARTRATE 25 MG PO TABS
25.0000 mg | ORAL_TABLET | Freq: Two times a day (BID) | ORAL | Status: DC
  Filled 2016-07-26: qty 1

## 2016-07-26 MED ORDER — CLOPIDOGREL BISULFATE 75 MG PO TABS
75.0000 mg | ORAL_TABLET | Freq: Once | ORAL | Status: AC
  Administered 2016-07-26: 75 mg via ORAL
  Filled 2016-07-26: qty 1

## 2016-07-26 MED ORDER — ASPIRIN 81 MG PO CHEW
CHEWABLE_TABLET | ORAL | Status: AC
  Filled 2016-07-26: qty 3

## 2016-07-26 MED ORDER — HYDROCOD POLST-CPM POLST ER 10-8 MG/5ML PO SUER
5.0000 mL | Freq: Two times a day (BID) | ORAL | Status: DC | PRN
  Administered 2016-07-26: 5 mL via ORAL
  Filled 2016-07-26: qty 5

## 2016-07-26 MED ORDER — ASPIRIN 81 MG PO CHEW
324.0000 mg | CHEWABLE_TABLET | Freq: Once | ORAL | Status: AC
  Administered 2016-07-26: 324 mg via ORAL
  Filled 2016-07-26: qty 4

## 2016-07-26 MED ORDER — LISINOPRIL 5 MG PO TABS: 5.0000 mg | ORAL_TABLET | Freq: Every day | ORAL | Status: DC

## 2016-07-26 MED ORDER — ACETAMINOPHEN 160 MG/5ML PO SOLN: 650.0000 mg | ORAL | Status: DC | PRN

## 2016-07-26 MED ORDER — GADOBENATE DIMEGLUMINE 529 MG/ML IV SOLN
14.0000 mL | Freq: Once | INTRAVENOUS | Status: AC | PRN
  Administered 2016-07-26: 14 mL via INTRAVENOUS

## 2016-07-26 MED ORDER — ATORVASTATIN CALCIUM 20 MG PO TABS
40.0000 mg | ORAL_TABLET | Freq: Every day | ORAL | Status: DC
  Administered 2016-07-26 – 2016-07-27 (×2): 40 mg via ORAL
  Filled 2016-07-26 (×2): qty 2

## 2016-07-26 MED ORDER — NICOTINE 14 MG/24HR TD PT24
14.0000 mg | MEDICATED_PATCH | Freq: Every day | TRANSDERMAL | Status: DC
  Administered 2016-07-26 – 2016-07-27 (×2): 14 mg via TRANSDERMAL
  Filled 2016-07-26 (×2): qty 2

## 2016-07-26 MED ORDER — ACETAMINOPHEN 325 MG PO TABS: 650.0000 mg | ORAL_TABLET | ORAL | Status: DC | PRN

## 2016-07-26 MED ORDER — ENOXAPARIN SODIUM 40 MG/0.4ML ~~LOC~~ SOLN
40.0000 mg | SUBCUTANEOUS | Status: DC
  Administered 2016-07-26: 21:00:00 40 mg via SUBCUTANEOUS
  Filled 2016-07-26: qty 0.4

## 2016-07-26 MED ORDER — FLUTICASONE PROPIONATE 50 MCG/ACT NA SUSP
1.0000 | Freq: Every day | NASAL | Status: DC | PRN
  Filled 2016-07-26: qty 16

## 2016-07-26 MED ORDER — IPRATROPIUM-ALBUTEROL 0.5-2.5 (3) MG/3ML IN SOLN
3.0000 mL | RESPIRATORY_TRACT | Status: DC | PRN
  Administered 2016-07-26: 3 mL via RESPIRATORY_TRACT
  Filled 2016-07-26: qty 3

## 2016-07-26 MED ORDER — PANTOPRAZOLE SODIUM 40 MG PO TBEC
40.0000 mg | DELAYED_RELEASE_TABLET | Freq: Every day | ORAL | Status: DC
  Administered 2016-07-27: 19:00:00 40 mg via ORAL
  Filled 2016-07-26: qty 1

## 2016-07-26 MED ORDER — SODIUM CHLORIDE 0.9 % IV SOLN
INTRAVENOUS | Status: DC
  Administered 2016-07-26 – 2016-07-27 (×3): via INTRAVENOUS

## 2016-07-26 MED ORDER — STROKE: EARLY STAGES OF RECOVERY BOOK
Freq: Once | Status: AC
  Administered 2016-07-26: 16:00:00

## 2016-07-26 MED ORDER — CLOPIDOGREL BISULFATE 75 MG PO TABS
75.0000 mg | ORAL_TABLET | Freq: Every day | ORAL | Status: DC
  Administered 2016-07-27: 75 mg via ORAL
  Filled 2016-07-26 (×2): qty 1

## 2016-07-26 MED ORDER — ACETAMINOPHEN 650 MG RE SUPP: 650.0000 mg | RECTAL | Status: DC | PRN

## 2016-07-26 MED ORDER — SENNOSIDES-DOCUSATE SODIUM 8.6-50 MG PO TABS
1.0000 | ORAL_TABLET | Freq: Every evening | ORAL | Status: DC | PRN
Start: 2016-07-26 — End: 2016-07-27

## 2016-07-26 MED ORDER — DIPHENHYDRAMINE HCL 25 MG PO CAPS: 25.0000 mg | ORAL_CAPSULE | Freq: Two times a day (BID) | ORAL | Status: DC | PRN

## 2016-07-26 NOTE — Telephone Encounter (Signed)
Called report:  IMPRESSION: 1. No evidence of brain metastasis. No explanation for left face symptoms. 2. Punctate DWI hyperintensity along a high right frontal gyrus, possible recent infarct. 3. Small occipital bone metastasis.   Electronically Signed   By: Monte Fantasia M.D.   On: 07/26/2016 09:40

## 2016-07-26 NOTE — ED Provider Notes (Signed)
[facility] Emergency Department Provider Note    First MD Initiated Contact with Patient 07/26/16 1216     (approximate)  I have reviewed the triage vital signs and the nursing notes.   HISTORY  Chief Complaint Cerebrovascular Accident    HPI JABEZ MOLNER is a 56 y.o. male with a history of hypertension, hyperlipidemia CAD status post CABG, ongoing tobacco use and severe peripheral artery disease  scheduled to start outpatient chemotherapy next Monday presents after having outpatient MRI done to rule out metastasis showing evidence of acute stroke. Patient's only deficit was complaining of intermittent burning sensation in the left face while he is lying flat but this would resolve with change of motion. Eyes any other weakness. Denies any previous history of known stroke.   Past Medical History:  Diagnosis Date  . Anemia   . CAD (coronary artery disease)    a. 08/2014 Inf STEMI/CABG x 3 (LIMA->LAD, VG->Diag, RIMA->RCA).  . DVT, recurrent, lower extremity, acute, left ([place]) 2016  . Hyperlipidemia   . Hypertension   . Hypertensive heart disease   . Lung cancer ([place])   . PAD (peripheral artery disease) ([place])    a. 03/2015 Periph Angio: short occlusion of L pop w/ evidence of embolization into the DP->5.0x50 mm Inova self-expanding stent;  b. 04/08/2015 ABI: R 1.12, L 0.99.  . Tobacco abuse   . Toe amputation status, left [facility]) 07/09/2016   2017   Family History  Problem Relation Age of Onset  . Hypertension Father   . Lung cancer Father   . Heart attack Mother 56    STENT  . Hyperlipidemia Mother   . Heart attack Brother   . Heart attack Brother   . Breast cancer Sister   . Heart attack Sister   . Colon cancer Paternal Grandfather    Past Surgical History:  Procedure Laterality Date  . CARDIAC CATHETERIZATION N/A 08/24/2014   Procedure: Left Heart Cath and Coronary Angiography;  Surgeon: Isaias Cowman, MD;  Location: [place]  CV LAB;  Service: Cardiovascular;  Laterality: N/A;  . CARDIAC CATHETERIZATION N/A 08/24/2014   Procedure: Coronary Stent Intervention;  Surgeon: Isaias Cowman, MD;  Location: [place] CV LAB;  Service: Cardiovascular;  Laterality: N/A;  . CORONARY ARTERY BYPASS GRAFT N/A 08/25/2014   Procedure: CORONARY ARTERY BYPASS GRAFTING (CABG), ON PUMP, TIMES THREE, USING BILATERAL MAMMARY ARTERIES, RIGHT GREATER SAPHENOUS VEIN HARVESTED ENDOSCOPICALLY;  Surgeon: Melrose Nakayama, MD;  Location: [place];  Service: Open Heart Surgery;  Laterality: N/A;  . PERIPHERAL VASCULAR CATHETERIZATION N/A 03/30/2015   Procedure: Abdominal Aortogram w/Lower Extremity;  Surgeon: Wellington Hampshire, MD;  Location: [place] CV LAB;  Service: Cardiovascular;  Laterality: N/A;  . TEE WITHOUT CARDIOVERSION N/A 08/25/2014   Procedure: TRANSESOPHAGEAL ECHOCARDIOGRAM (TEE);  Surgeon: Melrose Nakayama, MD;  Location: [place];  Service: Open Heart Surgery;  Laterality: N/A;   Patient Active Problem List   Diagnosis Date Noted  . Goals of care, counseling/discussion 07/26/2016  . Acute CVA (cerebrovascular accident) ([place]) 07/26/2016  . Small cell lung cancer ([place]) 07/19/2016  . Postobstructive pneumonia 07/17/2016  . Bone metastasis ([place]) 07/12/2016  . Lymphadenopathy, mediastinal 07/02/2016  . Weight loss 07/02/2016  . Night sweats 07/02/2016  . Iron deficiency anemia 01/08/2016  . Peripheral neuropathy ([place]) 06/12/2015  . PAD (peripheral artery disease) ([place]) 03/29/2015  . Essential (primary) hypertension 10/05/2014  . Hypercholesterolemia without hypertriglyceridemia 10/05/2014  . S/P CABG x 3 08/25/2014  . H/O  coronary artery bypass surgery 08/25/2014  . History of MI (myocardial infarction) 08/24/2014  . CAD (coronary artery disease) 08/24/2014  . Right ankle pain 07/20/2014  . Arthralgia of ankle or foot 07/20/2014  . Tobacco abuse 07/06/2014  . Seizures ([place]) 07/06/2014  . Preventative health care  07/06/2014  . Extremity pain 07/06/2014      Prior to Admission medications   Medication Sig Start Date End Date Taking? Authorizing Provider  atorvastatin (LIPITOR) 40 MG tablet Take 1 tablet (40 mg total) by mouth daily. 05/19/15  Yes Rogelia Mire, NP  chlorpheniramine-HYDROcodone [facility]) 10-8 MG/5ML SUER Take 5 mLs by mouth every 12 (twelve) hours as needed for cough. 07/25/16  Yes Lequita Asal, MD  clopidogrel (PLAVIX) 75 MG tablet Take 75 mg by mouth daily.   Yes Historical Provider, MD  diphenhydrAMINE (BENADRYL) 25 mg capsule Take 25 mg by mouth 2 (two) times daily as needed for allergies.    Yes Historical Provider, MD  fluticasone (FLONASE) 50 MCG/ACT nasal spray Place 1 spray into both nostrils daily. Patient taking differently: Place 1 spray into both nostrils daily as needed for allergies.  09/14/14 07/26/16 Yes Tasrif Ahmed, MD  HYDROcodone-acetaminophen (NORCO/VICODIN) 5-325 MG tablet Take 1 tablet by mouth every 6 (six) hours as needed for moderate pain. 07/25/16  Yes Lequita Asal, MD  lisinopril (PRINIVIL,ZESTRIL) 5 MG tablet TAKE 1 TABLET BY MOUTH DAILY 06/11/16  Yes Rogelia Mire, NP  metoprolol tartrate (LOPRESSOR) 25 MG tablet Take 1 tablet (25 mg total) by mouth 2 (two) times daily. 07/22/15  Yes Minna Merritts, MD  omeprazole (PRILOSEC) 20 MG capsule Take 20 mg by mouth daily.   Yes Historical Provider, MD  predniSONE (DELTASONE) 50 MG tablet Take 50 mg by mouth daily with breakfast.   Yes Historical Provider, MD    Allergies Amitriptyline; Contrast media [iodinated diagnostic agents]; Nsaids; and Ibuprofen    Social History Social History  Substance Use Topics  . Smoking status: Current Every Day Smoker    Packs/day: 0.50    Types: Cigarettes  . Smokeless tobacco: Never Used     Comment: 4cigs day down from 2ppd  . Alcohol use 0.0 oz/week     Comment: Sometimes on w/e.    Review of Systems Patient denies headaches,  rhinorrhea, blurry vision, numbness, shortness of breath, chest pain, edema, cough, abdominal pain, nausea, vomiting, diarrhea, dysuria, fevers, rashes or hallucinations unless otherwise stated above in HPI. ____________________________________________   PHYSICAL EXAM:  VITAL SIGNS: Vitals:   07/26/16 1713 07/26/16 1904  BP: 113/61 121/72  Pulse: 84 80  Resp: 16 16  Temp: 97.8 F (36.6 C) 97.8 F (36.6 C)    Constitutional: Alert and oriented. Well appearing and in no acute distress. Eyes: Conjunctivae are normal. PERRL. EOMI. Head: Atraumatic. Nose: No congestion/rhinnorhea. Mouth/Throat: Mucous membranes are moist.  Oropharynx non-erythematous. Neck: No stridor. Painless ROM. No cervical spine tenderness to palpation Hematological/Lymphatic/Immunilogical: No cervical lymphadenopathy. Cardiovascular: Normal rate, regular rhythm. Grossly normal heart sounds.  Good peripheral circulation. Respiratory: Normal respiratory effort.  No retractions. Lungs CTAB. Gastrointestinal: Soft and nontender. No distention. No abdominal bruits. No CVA tenderness. Musculoskeletal: No lower extremity tenderness nor edema.  No joint effusions. Neurologic:  CN- intact.  No facial droop, Normal FNF.  Normal heel to shin.  Sensation intact bilaterally. Normal speech and language. No gross focal neurologic deficits are appreciated. No gait instability. Skin:  Skin is warm, dry and intact. No rash noted. Psychiatric: Mood  and affect are normal. Speech and behavior are normal.  ____________________________________________   LABS (all labs ordered are listed, but only abnormal results are displayed)  Results for orders placed or performed during the hospital encounter of 07/26/16 (from the past 24 hour(s))  Protime-INR     Status: None   Collection Time: 07/26/16 12:25 PM  Result Value Ref Range   Prothrombin Time 12.7 11.4 - 15.2 seconds   INR 0.95   APTT     Status: None   Collection Time:  07/26/16 12:25 PM  Result Value Ref Range   aPTT 34 24 - 36 seconds  CBC     Status: Abnormal   Collection Time: 07/26/16 12:25 PM  Result Value Ref Range   WBC 3.6 (L) 3.8 - 10.6 K/uL   RBC 4.53 4.40 - 5.90 MIL/uL   Hemoglobin 13.7 13.0 - 18.0 g/dL   HCT 40.2 40.0 - 52.0 %   MCV 88.8 80.0 - 100.0 fL   MCH 30.3 26.0 - 34.0 pg   MCHC 34.1 32.0 - 36.0 g/dL   RDW 13.9 11.5 - 14.5 %   Platelets 236 150 - 440 K/uL  Differential     Status: Abnormal   Collection Time: 07/26/16 12:25 PM  Result Value Ref Range   Neutrophils Relative % 87 %   Neutro Abs 3.1 1.4 - 6.5 K/uL   Lymphocytes Relative 11 %   Lymphs Abs 0.4 (L) 1.0 - 3.6 K/uL   Monocytes Relative 2 %   Monocytes Absolute 0.1 (L) 0.2 - 1.0 K/uL   Eosinophils Relative 0 %   Eosinophils Absolute 0.0 0 - 0.7 K/uL   Basophils Relative 0 %   Basophils Absolute 0.0 0 - 0.1 K/uL  Comprehensive metabolic panel     Status: Abnormal   Collection Time: 07/26/16 12:25 PM  Result Value Ref Range   Sodium 126 (L) 135 - 145 mmol/L   Potassium 4.5 3.5 - 5.1 mmol/L   Chloride 96 (L) 101 - 111 mmol/L   CO2 22 22 - 32 mmol/L   Glucose, Bld 155 (H) 65 - 99 mg/dL   BUN 8 6 - 20 mg/dL   Creatinine, Ser 0.76 0.61 - 1.24 mg/dL   Calcium 9.0 8.9 - 10.3 mg/dL   Total Protein 6.6 6.5 - 8.1 g/dL   Albumin 3.4 (L) 3.5 - 5.0 g/dL   AST 26 15 - 41 U/L   ALT 16 (L) 17 - 63 U/L   Alkaline Phosphatase 72 38 - 126 U/L   Total Bilirubin 0.5 0.3 - 1.2 mg/dL   GFR calc non Af Amer >60 >60 mL/min   GFR calc Af Amer >60 >60 mL/min   Anion gap 8 5 - 15  Troponin I     Status: None   Collection Time: 07/26/16 12:25 PM  Result Value Ref Range   Troponin I <0.03 <0.03 ng/mL  Urine Drug Screen, Qualitative ([facility] only)     Status: Abnormal   Collection Time: 07/26/16  3:20 PM  Result Value Ref Range   Tricyclic, Ur Screen NONE DETECTED NONE DETECTED   Amphetamines, Ur Screen NONE DETECTED NONE DETECTED   MDMA (Ecstasy)Ur Screen NONE DETECTED NONE DETECTED    Cocaine Metabolite,Ur [place] NONE DETECTED NONE DETECTED   Opiate, Ur Screen POSITIVE (A) NONE DETECTED   Phencyclidine (PCP) Ur S NONE DETECTED NONE DETECTED   Cannabinoid 50 Ng, Ur [place] POSITIVE (A) NONE DETECTED   Barbiturates, Ur Screen NONE DETECTED NONE DETECTED  Benzodiazepine, Ur Scrn NONE DETECTED NONE DETECTED   Methadone Scn, Ur NONE DETECTED NONE DETECTED  Urinalysis, Complete w Microscopic     Status: Abnormal   Collection Time: 07/26/16  3:20 PM  Result Value Ref Range   Color, Urine STRAW (A) YELLOW   APPearance CLEAR (A) CLEAR   Specific Gravity, Urine 1.006 1.005 - 1.030   pH 6.0 5.0 - 8.0   Glucose, UA NEGATIVE NEGATIVE mg/dL   Hgb urine dipstick NEGATIVE NEGATIVE   Bilirubin Urine NEGATIVE NEGATIVE   Ketones, ur NEGATIVE NEGATIVE mg/dL   Protein, ur NEGATIVE NEGATIVE mg/dL   Nitrite NEGATIVE NEGATIVE   Leukocytes, UA NEGATIVE NEGATIVE   RBC / HPF NONE SEEN 0 - 5 RBC/hpf   WBC, UA 0-5 0 - 5 WBC/hpf   Bacteria, UA NONE SEEN NONE SEEN   Squamous Epithelial / LPF NONE SEEN NONE SEEN   ____________________________________________  EKG My review and personal interpretation at Time: 12:17   Indication: cva  Rate: 95  Rhythm: sinus Axis: normal Other: normal intervals, no STEMI ____________________________________________  RADIOLOGY  I personally reviewed all radiographic images ordered to evaluate for the above acute complaints and reviewed radiology reports and findings.  These findings were personally discussed with the patient.  Please see medical record for radiology report.  ____________________________________________   PROCEDURES  Procedure(s) performed:  Procedures    Critical Care performed: no ____________________________________________   INITIAL IMPRESSION / ASSESSMENT AND PLAN / ED COURSE  Pertinent labs & imaging results that were available during my care of the patient were reviewed by me and considered in my medical decision making (see  chart for details).  DDX: cva, tia, mass, electrolyte abnormality  VERLIE HELLENBRAND is a 56 y.o. who presents to the ED with multiple chronic medical conditions with an abnormal MRI showing signs of acute stroke. Patient of with no neuro deficit at this time. Otherwise he mechanically stable. I discussed case with Dr. Doy Mince with neurology who recommends admission for further stroke workup as this is a new diagnosis.  Have discussed with the patient and available family all diagnostics and treatments performed thus far and all questions were answered to the best of my ability. The patient demonstrates understanding and agreement with plan.       ____________________________________________   FINAL CLINICAL IMPRESSION(S) / ED DIAGNOSES  Final diagnoses:  Cerebrovascular accident (CVA), unspecified mechanism ([place])      NEW MEDICATIONS STARTED DURING THIS VISIT:  Current Discharge Medication List       Note:  This document was prepared using Dragon voice recognition software and may include unintentional dictation errors.    Merlyn Lot, MD 07/26/16 2108

## 2016-07-26 NOTE — Telephone Encounter (Signed)
Called Dr. Manuella Ghazi at [facility] with MRI report of brain, informed me that the patient needed to go to the ED stat for CVA, patient instructed of recent scan results and need to go to the ED, voiced understanding, ER charge nurse Katie notified of patients needs, voiced understanding.

## 2016-07-26 NOTE — Progress Notes (Signed)
START ON PATHWAY REGIMEN - Small Cell Lung     A cycle is every 21 days:     Etoposide      Carboplatin   **Always confirm dose/schedule in your pharmacy ordering system**    Patient Characteristics: Extensive Stage, First Line Stage Grouping: Extensive AJCC T Category: T3 AJCC N Category: N3 AJCC M Category: M1c AJCC 8 Stage Grouping: IVB Line of therapy: First Line Would you be surprised if this patient died  in the next year? I would NOT be surprised if this patient died in the next year  Intent of Therapy: Non-Curative / Palliative Intent, Discussed with Patient

## 2016-07-26 NOTE — ED Triage Notes (Signed)
Pt sent from Dr. Manuella Ghazi office for abnormal MRI result. Pt reports has stage 4 lung cancer and was sent for MRI to check for metastasis. Pt reports the last 3-4 nights has had tingling to left side of face when he lays down.

## 2016-07-26 NOTE — Progress Notes (Signed)
  Oncology Nurse Navigator Documentation  Navigator Location: [facility] (07/25/16 1600)   )Navigator Encounter Type: Follow-up Appt (07/25/16 1600)                     Patient Visit Type: MedOnc (07/25/16 1600) Treatment Phase: Pre-Tx/Tx Discussion (07/25/16 1600) Barriers/Navigation Needs: No barriers at this time;No Questions;No Needs (07/25/16 1600)          Met with patient during follow up visit with Dr. Mike Gip to discuss treatment planning. After visit, pt did not have any further questions or needs.                Time Spent with Patient: 60 (07/25/16 1600)

## 2016-07-26 NOTE — ED Notes (Signed)
Dr. Doy Mince at bedside at this time.

## 2016-07-26 NOTE — Telephone Encounter (Signed)
Called laura at [facility] and informed her that the patient would probably be inpatient r/t cva but we wanted to proceed with the pac tomorrow as ordered, laura reports that she will let scheduling be aware.

## 2016-07-26 NOTE — ED Notes (Signed)
Pt states was supposed to have his port-a-cath placed tomorrow and to start chemo on Monday.

## 2016-07-26 NOTE — Patient Instructions (Signed)
Etoposide, VP-16 injection What is this medicine? ETOPOSIDE, VP-16 (e toe POE side) is a chemotherapy drug. It is used to treat testicular cancer, lung cancer, and other cancers. This medicine may be used for other purposes; ask your health care provider or pharmacist if you have questions. COMMON BRAND NAME(S): Etopophos, Toposar, VePesid What should I tell my health care provider before I take this medicine? They need to know if you have any of these conditions: -infection -kidney disease -liver disease -low blood counts, like low white cell, platelet, or red cell counts -an unusual or allergic reaction to etoposide, other medicines, foods, dyes, or preservatives -pregnant or trying to get pregnant -breast-feeding How should I use this medicine? This medicine is for infusion into a vein. It is administered in a hospital or clinic by a specially trained health care professional. Talk to your pediatrician regarding the use of this medicine in children. Special care may be needed. Overdosage: If you think you have taken too much of this medicine contact a poison control center or emergency room at once. NOTE: This medicine is only for you. Do not share this medicine with others. What if I miss a dose? It is important not to miss your dose. Call your doctor or health care professional if you are unable to keep an appointment. What may interact with this medicine? -aspirin -certain medications for seizures like carbamazepine, phenobarbital, phenytoin, valproic acid -cyclosporine -levamisole -warfarin This list may not describe all possible interactions. Give your health care provider a list of all the medicines, herbs, non-prescription drugs, or dietary supplements you use. Also tell them if you smoke, drink alcohol, or use illegal drugs. Some items may interact with your medicine. What should I watch for while using this medicine? Visit your doctor for checks on your progress. This drug  may make you feel generally unwell. This is not uncommon, as chemotherapy can affect healthy cells as well as cancer cells. Report any side effects. Continue your course of treatment even though you feel ill unless your doctor tells you to stop. In some cases, you may be given additional medicines to help with side effects. Follow all directions for their use. Call your doctor or health care professional for advice if you get a fever, chills or sore throat, or other symptoms of a cold or flu. Do not treat yourself. This drug decreases your body's ability to fight infections. Try to avoid being around people who are sick. This medicine may increase your risk to bruise or bleed. Call your doctor or health care professional if you notice any unusual bleeding. Talk to your doctor about your risk of cancer. You may be more at risk for certain types of cancers if you take this medicine. Do not become pregnant while taking this medicine or for at least 6 months after stopping it. Women should inform their doctor if they wish to become pregnant or think they might be pregnant. Women of child-bearing potential will need to have a negative pregnancy test before starting this medicine. There is a potential for serious side effects to an unborn child. Talk to your health care professional or pharmacist for more information. Do not breast-feed an infant while taking this medicine. Men must use a latex condom during sexual contact with a woman while taking this medicine and for at least 4 months after stopping it. A latex condom is needed even if you have had a vasectomy. Contact your doctor right away if your partner becomes pregnant. Do   not donate sperm while taking this medicine and for at least 4 months after you stop taking this medicine. Men should inform their doctors if they wish to father a child. This medicine may lower sperm counts. What side effects may I notice from receiving this medicine? Side effects that  you should report to your doctor or health care professional as soon as possible: -allergic reactions like skin rash, itching or hives, swelling of the face, lips, or tongue -low blood counts - this medicine may decrease the number of white blood cells, red blood cells and platelets. You may be at increased risk for infections and bleeding. -signs of infection - fever or chills, cough, sore throat, pain or difficulty passing urine -signs of decreased platelets or bleeding - bruising, pinpoint red spots on the skin, black, tarry stools, blood in the urine -signs of decreased red blood cells - unusually weak or tired, fainting spells, lightheadedness -breathing problems -changes in vision -mouth or throat sores or ulcers -pain, redness, swelling or irritation at the injection site -pain, tingling, numbness in the hands or feet -redness, blistering, peeling or loosening of the skin, including inside the mouth -seizures -vomiting Side effects that usually do not require medical attention (report to your doctor or health care professional if they continue or are bothersome): -diarrhea -hair loss -loss of appetite -nausea -stomach pain This list may not describe all possible side effects. Call your doctor for medical advice about side effects. You may report side effects to FDA at 1-800-FDA-1088. Where should I keep my medicine? This drug is given in a hospital or clinic and will not be stored at home. NOTE: This sheet is a summary. It may not cover all possible information. If you have questions about this medicine, talk to your doctor, pharmacist, or health care provider.  2018 Elsevier/Gold Standard (2015-03-25 11:53:23) Carboplatin injection What is this medicine? CARBOPLATIN (KAR boe pla tin) is a chemotherapy drug. It targets fast dividing cells, like cancer cells, and causes these cells to die. This medicine is used to treat ovarian cancer and many other cancers. This medicine may be  used for other purposes; ask your health care provider or pharmacist if you have questions. COMMON BRAND NAME(S): Paraplatin What should I tell my health care provider before I take this medicine? They need to know if you have any of these conditions: -blood disorders -hearing problems -kidney disease -recent or ongoing radiation therapy -an unusual or allergic reaction to carboplatin, cisplatin, other chemotherapy, other medicines, foods, dyes, or preservatives -pregnant or trying to get pregnant -breast-feeding How should I use this medicine? This drug is usually given as an infusion into a vein. It is administered in a hospital or clinic by a specially trained health care professional. Talk to your pediatrician regarding the use of this medicine in children. Special care may be needed. Overdosage: If you think you have taken too much of this medicine contact a poison control center or emergency room at once. NOTE: This medicine is only for you. Do not share this medicine with others. What if I miss a dose? It is important not to miss a dose. Call your doctor or health care professional if you are unable to keep an appointment. What may interact with this medicine? -medicines for seizures -medicines to increase blood counts like filgrastim, pegfilgrastim, sargramostim -some antibiotics like amikacin, gentamicin, neomycin, streptomycin, tobramycin -vaccines Talk to your doctor or health care professional before taking any of these medicines: -acetaminophen -aspirin -ibuprofen -ketoprofen -naproxen   This list may not describe all possible interactions. Give your health care provider a list of all the medicines, herbs, non-prescription drugs, or dietary supplements you use. Also tell them if you smoke, drink alcohol, or use illegal drugs. Some items may interact with your medicine. What should I watch for while using this medicine? Your condition will be monitored carefully while you are  receiving this medicine. You will need important blood work done while you are taking this medicine. This drug may make you feel generally unwell. This is not uncommon, as chemotherapy can affect healthy cells as well as cancer cells. Report any side effects. Continue your course of treatment even though you feel ill unless your doctor tells you to stop. In some cases, you may be given additional medicines to help with side effects. Follow all directions for their use. Call your doctor or health care professional for advice if you get a fever, chills or sore throat, or other symptoms of a cold or flu. Do not treat yourself. This drug decreases your body's ability to fight infections. Try to avoid being around people who are sick. This medicine may increase your risk to bruise or bleed. Call your doctor or health care professional if you notice any unusual bleeding. Be careful brushing and flossing your teeth or using a toothpick because you may get an infection or bleed more easily. If you have any dental work done, tell your dentist you are receiving this medicine. Avoid taking products that contain aspirin, acetaminophen, ibuprofen, naproxen, or ketoprofen unless instructed by your doctor. These medicines may hide a fever. Do not become pregnant while taking this medicine. Women should inform their doctor if they wish to become pregnant or think they might be pregnant. There is a potential for serious side effects to an unborn child. Talk to your health care professional or pharmacist for more information. Do not breast-feed an infant while taking this medicine. What side effects may I notice from receiving this medicine? Side effects that you should report to your doctor or health care professional as soon as possible: -allergic reactions like skin rash, itching or hives, swelling of the face, lips, or tongue -signs of infection - fever or chills, cough, sore throat, pain or difficulty passing  urine -signs of decreased platelets or bleeding - bruising, pinpoint red spots on the skin, black, tarry stools, nosebleeds -signs of decreased red blood cells - unusually weak or tired, fainting spells, lightheadedness -breathing problems -changes in hearing -changes in vision -chest pain -high blood pressure -low blood counts - This drug may decrease the number of white blood cells, red blood cells and platelets. You may be at increased risk for infections and bleeding. -nausea and vomiting -pain, swelling, redness or irritation at the injection site -pain, tingling, numbness in the hands or feet -problems with balance, talking, walking -trouble passing urine or change in the amount of urine Side effects that usually do not require medical attention (report to your doctor or health care professional if they continue or are bothersome): -hair loss -loss of appetite -metallic taste in the mouth or changes in taste This list may not describe all possible side effects. Call your doctor for medical advice about side effects. You may report side effects to FDA at 1-800-FDA-1088. Where should I keep my medicine? This drug is given in a hospital or clinic and will not be stored at home. NOTE: This sheet is a summary. It may not cover all possible information. If you have  questions about this medicine, talk to your doctor, pharmacist, or health care provider.  2018 Elsevier/Gold Standard (2007-07-08 14:38:05)  

## 2016-07-26 NOTE — ED Notes (Signed)
Report given to [facility], RN.

## 2016-07-26 NOTE — Telephone Encounter (Signed)
Discussed with Dr. Manuella Ghazi per Dr. Kem Parkinson advise.  Patient notified to go to the ED and ED notified of CVA on MRI this am

## 2016-07-26 NOTE — H&P (Addendum)
[facility] [place] NAME: Philip [name]    MR#:  [id number]  DATE OF BIRTH:  [birth year]  DATE OF ADMISSION:  07/26/2016  PRIMARY CARE PHYSICIAN: [name], MD   REQUESTING/REFERRING PHYSICIAN: Merlyn Lot, MD  CHIEF COMPLAINT:   Chief Complaint  Patient presents with  . Cerebrovascular Accident   Acute CVA. HISTORY OF PRESENT ILLNESS:  Philip [name]  is a 56 y.o. male with a known history of Hypertension, hyperlipidemia, DVT, CAD, PAD and lung cancer. The patient was sent by oncologist due to acute stroke per MRI of the brain. Patient has history of lung cancer. He was scheduled to start outpatient chemotherapy next Monday. He feels left head sensation changes and that I MRI of the brain, which show acute CVA. Neurologist suggested admitting patient and workup.  PAST MEDICAL HISTORY:   Past Medical History:  Diagnosis Date  . Anemia   . CAD (coronary artery disease)    a. 08/2014 Inf STEMI/CABG x 3 (LIMA->LAD, VG->Diag, RIMA->RCA).  . DVT, recurrent, lower extremity, acute, left ([place]) 2016  . Hyperlipidemia   . Hypertension   . Hypertensive heart disease   . Lung cancer ([place])   . PAD (peripheral artery disease) ([place])    a. 03/2015 Periph Angio: short occlusion of L pop w/ evidence of embolization into the DP->5.0x50 mm Inova self-expanding stent;  b. 04/08/2015 ABI: R 1.12, L 0.99.  . Tobacco abuse   . 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 CARDIAC CATHETERIZATION N/A 08/24/2014   Procedure: Left Heart Cath and Coronary Angiography;  Surgeon: Isaias Cowman, MD;  Location: [facility];  Service: Cardiovascular;  Laterality: N/A;  . CARDIAC CATHETERIZATION N/A 08/24/2014   Procedure: Coronary Stent Intervention;  Surgeon: Isaias Cowman, MD;  Location: [facility];  Service: Cardiovascular;   Laterality: N/A;  . CORONARY ARTERY BYPASS GRAFT N/A 08/25/2014   Procedure: CORONARY ARTERY BYPASS GRAFTING (CABG), ON PUMP, TIMES THREE, USING BILATERAL MAMMARY ARTERIES, RIGHT GREATER SAPHENOUS VEIN HARVESTED ENDOSCOPICALLY;  Surgeon: Melrose Nakayama, MD;  Location: [facility];  Service: Open Heart Surgery;  Laterality: N/A;  . PERIPHERAL VASCULAR CATHETERIZATION N/A 03/30/2015   Procedure: Abdominal Aortogram w/Lower Extremity;  Surgeon: Wellington Hampshire, MD;  Location: [facility];  Service: Cardiovascular;  Laterality: N/A;  . TEE WITHOUT CARDIOVERSION N/A 08/25/2014   Procedure: TRANSESOPHAGEAL ECHOCARDIOGRAM (TEE);  Surgeon: Melrose Nakayama, MD;  Location: [facility];  Service: Open Heart Surgery;  Laterality: N/A;    SOCIAL HISTORY:   Social History  Substance Use Topics  . Smoking status: Current Every Day Smoker    Packs/day: 0.50    Types: Cigarettes  . Smokeless tobacco: Never Used     Comment: 4cigs day down from 2ppd  . Alcohol use 0.0 oz/week     Comment: Sometimes on w/e.    FAMILY HISTORY:   Family History  Problem Relation Age of Onset  . Hypertension Father   . Lung cancer Father   . Heart attack Mother 7    STENT  . Hyperlipidemia Mother   . Heart attack Brother   . Heart attack Brother   . Breast cancer Sister   . Heart attack Sister   . Colon cancer Paternal Grandfather     DRUG ALLERGIES:   Allergies  Allergen Reactions  . Amitriptyline Other (  See Comments)    Burning sensation all over  . Contrast Media [Iodinated Diagnostic Agents] Other (See Comments)    Pt states that he got "knots behind his ears".  . Nsaids Other (See Comments)    Other reaction(s): Other (See Comments) Reaction:  Blood in stool  Reaction:  Blood in stool   . Ibuprofen Other (See Comments) and Nausea Only    Blood in stools    REVIEW OF SYSTEMS:   Review of Systems  Constitutional: Negative for chills, fever and malaise/fatigue.  HENT: Negative for  congestion.   Eyes: Negative for blurred vision and double vision.  Respiratory: Positive for wheezing. Negative for cough, shortness of breath and stridor.   Cardiovascular: Negative for chest pain and leg swelling.  Gastrointestinal: Negative for abdominal pain, blood in stool, diarrhea, melena, nausea and vomiting.  Genitourinary: Negative for dysuria and hematuria.  Musculoskeletal: Negative for back pain.  Skin: Negative for itching and rash.  Neurological: Positive for sensory change. Negative for dizziness, tingling, tremors, speech change, focal weakness, seizures, loss of consciousness, weakness and headaches.  Psychiatric/Behavioral: Negative for depression. The patient is not nervous/anxious.     MEDICATIONS AT HOME:   Prior to Admission medications   Medication Sig Start Date End Date Taking? Authorizing Provider  atorvastatin (LIPITOR) 40 MG tablet Take 1 tablet (40 mg total) by mouth daily. 05/19/15  Yes Rogelia Mire, NP  chlorpheniramine-HYDROcodone [facility] ER) 10-8 MG/5ML SUER Take 5 mLs by mouth every 12 (twelve) hours as needed for cough. 07/25/16  Yes Lequita Asal, MD  clopidogrel (PLAVIX) 75 MG tablet Take 75 mg by mouth daily.   Yes Historical Provider, MD  diphenhydrAMINE (BENADRYL) 25 mg capsule Take 25 mg by mouth 2 (two) times daily as needed for allergies.    Yes Historical Provider, MD  fluticasone (FLONASE) 50 MCG/ACT nasal spray Place 1 spray into both nostrils daily. Patient taking differently: Place 1 spray into both nostrils daily as needed for allergies.  09/14/14 07/26/16 Yes Tasrif Ahmed, MD  HYDROcodone-acetaminophen (NORCO/VICODIN) 5-325 MG tablet Take 1 tablet by mouth every 6 (six) hours as needed for moderate pain. 07/25/16  Yes Lequita Asal, MD  lisinopril (PRINIVIL,ZESTRIL) 5 MG tablet TAKE 1 TABLET BY MOUTH DAILY 06/11/16  Yes Rogelia Mire, NP  metoprolol tartrate (LOPRESSOR) 25 MG tablet Take 1 tablet (25 mg total)  by mouth 2 (two) times daily. 07/22/15  Yes Minna Merritts, MD  omeprazole (PRILOSEC) 20 MG capsule Take 20 mg by mouth daily.   Yes Historical Provider, MD  predniSONE (DELTASONE) 50 MG tablet Take 50 mg by mouth daily with breakfast.   Yes Historical Provider, MD      VITAL SIGNS:  Blood pressure 122/72, pulse 91, temperature 98 F (36.7 C), temperature source Oral, resp. rate 16, height '5\' 11"'$  (1.803 m), weight 152 lb (68.9 kg), SpO2 95 %.  PHYSICAL EXAMINATION:  Physical Exam  GENERAL:  56 y.o.-year-old patient lying in the bed with no acute distress.  EYES: Pupils equal, round, reactive to light and accommodation. No scleral icterus. Extraocular muscles intact.  HEENT: Head atraumatic, normocephalic. Oropharynx and nasopharynx clear.  NECK:  Supple, no jugular venous distention. No thyroid enlargement, no tenderness.  LUNGS: Normal breath sounds bilaterally, mild wheezing, no rales,rhonchi or crepitation. No use of accessory muscles of respiration.  CARDIOVASCULAR: S1, S2 normal. No murmurs, rubs, or gallops.  ABDOMEN: Soft, nontender, nondistended. Bowel sounds present. No organomegaly or mass.  EXTREMITIES: No pedal  edema, cyanosis, or clubbing.  NEUROLOGIC: Cranial nerves II through XII are intact. Muscle strength 5/5 in all extremities. Sensation intact. Gait not checked.  PSYCHIATRIC: The patient is alert and oriented x 3.  SKIN: No obvious rash, lesion, or ulcer.   LABORATORY PANEL:   CBC  Recent Labs Lab 07/26/16 1225  WBC 3.6*  HGB 13.7  HCT 40.2  PLT 236   ------------------------------------------------------------------------------------------------------------------  Chemistries   Recent Labs Lab 07/26/16 1225  NA 126*  K 4.5  CL 96*  CO2 22  GLUCOSE 155*  BUN 8  CREATININE 0.76  CALCIUM 9.0  AST 26  ALT 16*  ALKPHOS 72  BILITOT 0.5    ------------------------------------------------------------------------------------------------------------------  Cardiac Enzymes  Recent Labs Lab 07/26/16 1225  TROPONINI <0.03   ------------------------------------------------------------------------------------------------------------------  RADIOLOGY:  Dg Chest 2 View  Result Date: 07/26/2016 CLINICAL DATA:  Shortness of breath, cough and chest pain. Stage IV lung cancer. EXAM: CHEST  2 VIEW COMPARISON:  06/28/2016 FINDINGS: Heart size is normal. Previous median sternotomy and CABG. Right lung is clear and right chest is normal. There is slight decrease in left hilar mass and superior mediastinal adenopathy by radiography. Minimal left base atelectasis and small left effusion. No other new finding. IMPRESSION: Small left effusion with mild left base atelectasis newly seen. Slight radiographic reduction of left hilar mass and superior mediastinal adenopathy. Electronically Signed   By: Nelson Chimes M.D.   On: 07/26/2016 09:52   Mr Jeri Cos LH Contrast  Result Date: 07/26/2016 CLINICAL DATA:  Recent diagnosis of lung cancer. Left-sided facial burning when laying down. EXAM: MRI HEAD WITHOUT AND WITH CONTRAST TECHNIQUE: Multiplanar, multiecho pulse sequences of the brain and surrounding structures were obtained without and with intravenous contrast. CONTRAST:  40m MULTIHANCE GADOBENATE DIMEGLUMINE 529 MG/ML IV SOLN COMPARISON:  None. FINDINGS: Brain: No enhancement or swelling to suggest metastatic disease. There is a punctate DWI hyperintensity along high right frontal gyrus, presumably an incidental infarct. No prior infarct or notable microvascular ischemic change. No hemorrhage or hydrocephalus. Vascular: Preserved flow voids Skull and upper cervical spine: 1 cm marrow lesion in the upper occipital bone. Patient has known osseous metastatic disease. No evidence of intracranial invasion. Sinuses/Orbits: Negative.  No mass. No fat  effacement or abnormal enhancement along the course of the left trigeminal nerve. These results will be called to the ordering clinician or representative by the Radiologist Assistant, and communication documented in the PACS or zVision Dashboard. IMPRESSION: 1. No evidence of brain metastasis. No explanation for left face symptoms. 2. Punctate DWI hyperintensity along a high right frontal gyrus, possible recent infarct. 3. Small occipital bone metastasis. Electronically Signed   By: JMonte FantasiaM.D.   On: 07/26/2016 09:40      IMPRESSION AND PLAN:   Acute CVA. The patient is being admitted to medical floor. Telemetry monitor, neuro check, MRI of the brain, carotid duplex and echocardiogram. Follow-up neurology consult. Continue aspirin and Plavix and the Lipitor.PTOT consult.  Hyponatremia. Start normal saline and follow-up BMP. CAD and PAD. Continue aspirin and Plavix and Lipitor. Hypertension. Continue home hypertension medication. No concerns reason follow-up oncologist for chemotherapy as an outpatient. Tobacco abuse. Smoking cessation was counseled for 4 minutes, nicotine patch.  All the records are reviewed and case discussed with ED provider. Management plans discussed with the patient, his wife  and they are in agreement.  CODE STATUS: Full code  TOTAL TIME TAKING CARE OF THIS PATIENT: 55 minutes.    CDemetrios LollM.D on  07/26/2016 at 3:05 PM  Between 7am to 6pm - Pager - [phone]  After 6pm go to [email or web address] - Proofreader  [facility]  Office  [phone]  CC: Primary care physician; [name], MD   Note: This dictation was prepared with Dragon dictation along with smaller phrase technology. Any transcriptional errors that result from this process are unintentional.

## 2016-07-26 NOTE — Progress Notes (Signed)
Hematology/Oncology Consult note [facility] Telephone:([phone] [phone]  Patient Care Team: [name], MD as PCP - General (Family Medicine)   Name of the patient: Philip [name]  [id number]  1960/05/09    Reason for consult: newly diagnosed small cell lung cancer. h/o left face burning sensation   Requesting physician: Dr. Bridgett Larsson  Date of visit: 07/26/2016    History of presenting illness- 56 yr old male with new diagnosis of extensive stage small cell lung cancer yet to start chemotherapy. He has been complaining of left face burning sensation and headache on lying down which resolves after sitting. MRI brain showed Punctate DWI hyperintensity along a high right frontal gyrus,possible recent infarct. Patient has been seen by neurology who is concerned about symptoms that could be from leptomeningeal disease versus paraneoplastic syndrome. He has been admitted for stroke work up as an inpatient. Patient reports burning in his left side of face on lying down associated with headache which resolves after sitting up. This has been ongoing for 1 week. Denies any focal weakness or numbness  ECOG PS- 1  Pain scale- 0   Review of systems- Review of Systems  Constitutional: Positive for malaise/fatigue and weight loss. Negative for chills and fever.  HENT: Negative for congestion, ear discharge and nosebleeds.   Eyes: Negative for blurred vision.  Respiratory: Negative for cough, hemoptysis, sputum production, shortness of breath and wheezing.   Cardiovascular: Negative for chest pain, palpitations, orthopnea and claudication.  Gastrointestinal: Negative for abdominal pain, blood in stool, constipation, diarrhea, heartburn, melena, nausea and vomiting.  Genitourinary: Negative for dysuria, flank pain, frequency, hematuria and urgency.  Musculoskeletal: Negative for back pain, joint pain and myalgias.  Skin: Negative for rash.  Neurological:  Negative for dizziness, tingling, focal weakness, seizures, weakness and headaches.       Headache and left face burning sensation +  Endo/Heme/Allergies: Does not bruise/bleed easily.  Psychiatric/Behavioral: Negative for depression and suicidal ideas. The patient does not have insomnia.     Allergies  Allergen Reactions  . Amitriptyline Other (See Comments)    Burning sensation all over  . Contrast Media [Iodinated Diagnostic Agents] Other (See Comments)    Pt states that he got "knots behind his ears".  . Nsaids Other (See Comments)    Other reaction(s): Other (See Comments) Reaction:  Blood in stool  Reaction:  Blood in stool   . Ibuprofen Other (See Comments) and Nausea Only    Blood in stools    Patient Active Problem List   Diagnosis Date Noted  . Goals of care, counseling/discussion 07/26/2016  . Acute CVA (cerebrovascular accident) ([place]) 07/26/2016  . Small cell lung cancer ([place]) 07/19/2016  . Postobstructive pneumonia 07/17/2016  . Bone metastasis ([place]) 07/12/2016  . Lymphadenopathy, mediastinal 07/02/2016  . Weight loss 07/02/2016  . Night sweats 07/02/2016  . Iron deficiency anemia 01/08/2016  . Peripheral neuropathy ([place]) 06/12/2015  . PAD (peripheral artery disease) ([place]) 03/29/2015  . Essential (primary) hypertension 10/05/2014  . Hypercholesterolemia without hypertriglyceridemia 10/05/2014  . S/P CABG x 3 08/25/2014  . H/O coronary artery bypass surgery 08/25/2014  . History of MI (myocardial infarction) 08/24/2014  . CAD (coronary artery disease) 08/24/2014  . Right ankle pain 07/20/2014  . Arthralgia of ankle or foot 07/20/2014  . Tobacco abuse 07/06/2014  . Seizures ([place]) 07/06/2014  . Preventative health care 07/06/2014  . Extremity pain 07/06/2014     Past Medical History:  Diagnosis Date  . Anemia   .  CAD (coronary artery disease)    a. 08/2014 Inf STEMI/CABG x 3 (LIMA->LAD, VG->Diag, RIMA->RCA).  . DVT, recurrent, lower extremity, acute, left  ([place]) 2016  . Hyperlipidemia   . Hypertension   . Hypertensive heart disease   . Lung cancer ([name])   . PAD (peripheral artery disease) ([place])    a. 03/2015 Periph Angio: short occlusion of L pop w/ evidence of embolization into the DP->5.0x50 mm Inova self-expanding stent;  b. 04/08/2015 ABI: R 1.12, L 0.99.  . Tobacco abuse   . Toe amputation status, left [facility]) 07/09/2016   2017     Past Surgical History:  Procedure Laterality Date  . CARDIAC CATHETERIZATION N/A 08/24/2014   Procedure: Left Heart Cath and Coronary Angiography;  Surgeon: Isaias Cowman, MD;  Location: [place] [facility];  Service: Cardiovascular;  Laterality: N/A;  . CARDIAC CATHETERIZATION N/A 08/24/2014   Procedure: Coronary Stent Intervention;  Surgeon: Isaias Cowman, MD;  Location: [place] [facility];  Service: Cardiovascular;  Laterality: N/A;  . CORONARY ARTERY BYPASS GRAFT N/A 08/25/2014   Procedure: CORONARY ARTERY BYPASS GRAFTING (CABG), ON PUMP, TIMES THREE, USING BILATERAL MAMMARY ARTERIES, RIGHT GREATER SAPHENOUS VEIN HARVESTED ENDOSCOPICALLY;  Surgeon: Melrose Nakayama, MD;  Location: [place];  Service: Open Heart Surgery;  Laterality: N/A;  . PERIPHERAL VASCULAR CATHETERIZATION N/A 03/30/2015   Procedure: Abdominal Aortogram w/Lower Extremity;  Surgeon: Wellington Hampshire, MD;  Location: [place] [facility];  Service: Cardiovascular;  Laterality: N/A;  . TEE WITHOUT CARDIOVERSION N/A 08/25/2014   Procedure: TRANSESOPHAGEAL ECHOCARDIOGRAM (TEE);  Surgeon: Melrose Nakayama, MD;  Location: [place];  Service: Open Heart Surgery;  Laterality: N/A;    Social History   Social History  . Marital status: Married    Spouse name: N/A  . Number of children: N/A  . Years of education: N/A   Occupational History  . Not on file.   Social History Main Topics  . Smoking status: Current Every Day Smoker    Packs/day: 0.50    Types: Cigarettes  . Smokeless tobacco: Never Used     Comment: 4cigs day  down from 2ppd  . Alcohol use 0.0 oz/week     Comment: Sometimes on w/e.  . Drug use: No  . Sexual activity: Not on file   Other Topics Concern  . Not on file   Social History Narrative  . No narrative on file     Family History  Problem Relation Age of Onset  . Hypertension Father   . Lung cancer Father   . Heart attack Mother 25    STENT  . Hyperlipidemia Mother   . Heart attack Brother   . Heart attack Brother   . Breast cancer Sister   . Heart attack Sister   . Colon cancer Paternal Grandfather      Current Facility-Administered Medications:  .  0.9 %  sodium chloride infusion, , Intravenous, Continuous, Demetrios Loll, MD, Last Rate: 100 mL/hr at 07/26/16 1530 .  acetaminophen (TYLENOL) tablet 650 mg, 650 mg, Oral, Q4H PRN **OR** acetaminophen (TYLENOL) solution 650 mg, 650 mg, Per Tube, Q4H PRN **OR** acetaminophen (TYLENOL) suppository 650 mg, 650 mg, Rectal, Q4H PRN, Demetrios Loll, MD .  atorvastatin (LIPITOR) tablet 40 mg, 40 mg, Oral, Daily, Demetrios Loll, MD, 40 mg at 07/26/16 1530 .  chlorpheniramine-HYDROcodone (TUSSIONEX) 10-8 MG/5ML suspension 5 mL, 5 mL, Oral, Q12H PRN, Demetrios Loll, MD, 5 mL at 07/26/16 1530 .  clopidogrel (PLAVIX) tablet 75 mg, 75 mg, Oral,  Daily, Demetrios Loll, MD .  diphenhydrAMINE (BENADRYL) capsule 25 mg, 25 mg, Oral, BID PRN, Demetrios Loll, MD .  enoxaparin (LOVENOX) injection 40 mg, 40 mg, Subcutaneous, Q24H, Demetrios Loll, MD .  fluticasone (FLONASE) 50 MCG/ACT nasal spray 1 spray, 1 spray, Each Nare, Daily PRN, Demetrios Loll, MD .  HYDROcodone-acetaminophen (NORCO/VICODIN) 5-325 MG per tablet 1 tablet, 1 tablet, Oral, Q6H PRN, Demetrios Loll, MD, 1 tablet at 07/26/16 1530 .  ipratropium-albuterol (DUONEB) 0.5-2.5 (3) MG/3ML nebulizer solution 3 mL, 3 mL, Nebulization, Q4H PRN, Demetrios Loll, MD, 3 mL at 07/26/16 1601 .  [START ON 07/27/2016] lisinopril (PRINIVIL,ZESTRIL) tablet 5 mg, 5 mg, Oral, Daily, Demetrios Loll, MD .  metoprolol tartrate (LOPRESSOR) tablet 25 mg, 25 mg,  Oral, BID, Demetrios Loll, MD .  nicotine (NICODERM CQ - dosed in mg/24 hr) patch 14 mg, 14 mg, Transdermal, Daily, Demetrios Loll, MD, 14 mg at 07/26/16 1601 .  [START ON 07/27/2016] pantoprazole (PROTONIX) EC tablet 40 mg, 40 mg, Oral, Daily, Demetrios Loll, MD .  senna-docusate (Senokot-S) tablet 1 tablet, 1 tablet, Oral, QHS PRN, Demetrios Loll, MD   Physical exam:  Vitals:   07/26/16 1300 07/26/16 1330 07/26/16 1400 07/26/16 1454  BP: 117/73 123/78 129/82 122/72  Pulse: 96 94 98 91  Resp: 17 15 (!) 21 16  Temp:    98 F (36.7 C)  TempSrc:    Oral  SpO2: 95% 92% 93% 95%  Weight:      Height:       Physical Exam  Constitutional: He is oriented to person, place, and time and well-developed, well-nourished, and in no distress.  Thin, fatigued, no acute distress  HENT:  Head: Normocephalic and atraumatic.  Eyes: EOM are normal. Pupils are equal, round, and reactive to light.  Neck: Normal range of motion.  Cardiovascular: Normal rate, regular rhythm and normal heart sounds.   Pulmonary/Chest: Effort normal and breath sounds normal.  Abdominal: Soft. Bowel sounds are normal.  Neurological: He is alert and oriented to person, place, and time.  Skin: Skin is warm and dry.       CMP Latest Ref Rng & Units 07/26/2016  Glucose 65 - 99 mg/dL 155(H)  BUN 6 - 20 mg/dL 8  Creatinine 0.61 - 1.24 mg/dL 0.76  Sodium 135 - 145 mmol/L 126(L)  Potassium 3.5 - 5.1 mmol/L 4.5  Chloride 101 - 111 mmol/L 96(L)  CO2 22 - 32 mmol/L 22  Calcium 8.9 - 10.3 mg/dL 9.0  Total Protein 6.5 - 8.1 g/dL 6.6  Total Bilirubin 0.3 - 1.2 mg/dL 0.5  Alkaline Phos 38 - 126 U/L 72  AST 15 - 41 U/L 26  ALT 17 - 63 U/L 16(L)   CBC Latest Ref Rng & Units 07/26/2016  WBC 3.8 - 10.6 K/uL 3.6(L)  Hemoglobin 13.0 - 18.0 g/dL 13.7  Hematocrit 40.0 - 52.0 % 40.2  Platelets 150 - 440 K/uL 236    '@IMAGES'$ @  Dg Chest 2 View  Result Date: 07/26/2016 CLINICAL DATA:  Shortness of breath, cough and chest pain. Stage IV lung cancer.  EXAM: CHEST  2 VIEW COMPARISON:  06/28/2016 FINDINGS: Heart size is normal. Previous median sternotomy and CABG. Right lung is clear and right chest is normal. There is slight decrease in left hilar mass and superior mediastinal adenopathy by radiography. Minimal left base atelectasis and small left effusion. No other new finding. IMPRESSION: Small left effusion with mild left base atelectasis newly seen. Slight radiographic reduction of left hilar mass and  superior mediastinal adenopathy. Electronically Signed   By: Nelson Chimes M.D.   On: 07/26/2016 09:52   Ct Chest Wo Contrast  Result Date: 06/28/2016 CLINICAL DATA:  Adenopathy appreciable on chest radiograph EXAM: CT CHEST WITHOUT CONTRAST TECHNIQUE: Multidetector CT imaging of the chest was performed following the standard protocol without IV contrast. COMPARISON:  Chest radiograph June 28, 2016 FINDINGS: Cardiovascular: There is no appreciable thoracic aortic aneurysm. There is mild atherosclerotic calcification involving the proximal great vessels. There is atherosclerotic calcification throughout the aorta. There are scattered foci of coronary artery calcification. Patient is status post coronary artery bypass grafting. Pericardium is rather minimally thickened. Mediastinum/Nodes: Thyroid appears unremarkable. There is extensive adenopathy throughout the mediastinum. Multiple lymph nodes are noted adjacent to the descending thoracic aorta, largest measuring 2.1 x 1.7 cm. There is adenopathy in the aortopulmonary window region with the largest confluence of lymph nodes in this area measuring 4.0 x 2.6 cm. There is hilar adenopathy on the left measuring 2.0 x 1.9 cm. There are multiple enlarged lymph nodes in the pretracheal region, largest measuring 1.7 x 1.7 cm. There are very carinal lymph nodes, largest just to the left of the carina measuring 2.8 x 1.9 cm. There is a sub- carinal lymph node measuring 2.4 x 1.6 cm. Several other enlarged lymph nodes  are noted in the mediastinum with the greatest concentration of lymph nodes in the precarinal region. Lungs/Pleura: There is evidence of bullae in the upper lobes, somewhat more on the right than on the left. There is scarring in the posterior left base region. There are areas of mild atelectatic change in the left upper lobe. There is no airspace consolidation or edema. On axial slice 47 series 3, there is a 4 x 3 mm nodular opacity in the anterior segment of the left upper lobe. No larger pulmonary nodular lesions are evident. Upper Abdomen: In the visualized upper abdomen, there is cholelithiasis. Gallbladder wall does not appear appreciably thickened. There is atherosclerotic calcification in the aorta. Visualized upper abdominal structures otherwise appear unremarkable. Musculoskeletal: Patient is status post median sternotomy. There are no evident blastic or lytic bone lesions. IMPRESSION: Extensive adenopathy. This degree of adenopathy raises concern for potential neoplasia. Lymphoma in particular is a differential consideration given the degree of adenopathy present. Scattered bullae in the lung apices, more on the right than on the left. 4 x 3 mm nodular opacity anterior segment left upper lobe. No other pulmonary nodular lesion evident. Multiple foci of atherosclerotic calcification as well as foci of coronary artery calcification. Patient has had coronary artery bypass grafting. Cholelithiasis. Electronically Signed   By: Lowella Grip III M.D.   On: 06/28/2016 14:54   Mr Jeri Cos RW Contrast  Result Date: 07/26/2016 CLINICAL DATA:  Recent diagnosis of lung cancer. Left-sided facial burning when laying down. EXAM: MRI HEAD WITHOUT AND WITH CONTRAST TECHNIQUE: Multiplanar, multiecho pulse sequences of the brain and surrounding structures were obtained without and with intravenous contrast. CONTRAST:  107m MULTIHANCE GADOBENATE DIMEGLUMINE 529 MG/ML IV SOLN COMPARISON:  None. FINDINGS: Brain: No  enhancement or swelling to suggest metastatic disease. There is a punctate DWI hyperintensity along high right frontal gyrus, presumably an incidental infarct. No prior infarct or notable microvascular ischemic change. No hemorrhage or hydrocephalus. Vascular: Preserved flow voids Skull and upper cervical spine: 1 cm marrow lesion in the upper occipital bone. Patient has known osseous metastatic disease. No evidence of intracranial invasion. Sinuses/Orbits: Negative.  No mass. No fat effacement or abnormal enhancement  along the course of the left trigeminal nerve. These results will be called to the ordering clinician or representative by the Radiologist Assistant, and communication documented in the PACS or zVision Dashboard. IMPRESSION: 1. No evidence of brain metastasis. No explanation for left face symptoms. 2. Punctate DWI hyperintensity along a high right frontal gyrus, possible recent infarct. 3. Small occipital bone metastasis. Electronically Signed   By: Monte Fantasia M.D.   On: 07/26/2016 09:40   Nm Pet Image Initial (pi) Skull Base To Thigh  Result Date: 07/09/2016 CLINICAL DATA:  Initial treatment strategy for weight loss with night sweats and mediastinal adenopathy. Smoking history. EXAM: NUCLEAR MEDICINE PET SKULL BASE TO THIGH TECHNIQUE: 13.1 mCi F-18 FDG was injected intravenously. Full-ring PET imaging was performed from the skull base to thigh after the radiotracer. CT data was obtained and used for attenuation correction and anatomic localization. FASTING BLOOD GLUCOSE:  Value: 99 mg/dl COMPARISON:  Chest CT 06/28/2016. FINDINGS: NECK Bilateral low jugular/ supraclavicular nodal metastasis. Example right-sided node which measures 9 mm and a S.U.V. max of 5.1 on image 66/series 3. Bilateral carotid atherosclerosis. CHEST Central left suprahilar and upper lobe infiltrative lung primary. This measures on the order of 6.9 x 4.4 cm and a S.U.V. max of 13.4 on image 106/series 3. Contralateral  mediastinal nodal metastasis, with an index right paratracheal node measuring 1.9 cm and a S.U.V. max of 10.7 on image 95/series 3. Prevascular nodes measure up to 1.4 cm and a S.U.V. max of 10.2 on image 92/series 3. Mild hypermetabolism corresponding to ill-defined left lower lobe peribronchovascular opacity, including on image 128/series 3. This is new, suspicious for infection or postobstructive pneumonitis. Prior median sternotomy. Mild cardiomegaly. Centrilobular and paraseptal emphysema. 4 mm left upper lobe pulmonary nodule is below PET resolution. ABDOMEN/PELVIS No abdominopelvic parenchymal or nodal hypermetabolism. Gallstones of maximally 9 mm. Abdominal aortic atherosclerosis. Scattered colonic diverticula. SKELETON Multifocal hypermetabolic osseous metastasis. Focus of hypermetabolism within the tenth posteromedial right rib likely corresponds to a nondisplaced fracture on image 132/ series 3. This measures a S.U.V. max of 4.5. There is a left iliac area of vague hypermetabolism an subtle sclerosis which measures a S.U.V. max of 4.3 on image 216/ series 3. Subtle hypermetabolism involves the C5 vertebral body, measuring a S.U.V. max of 3.6. Left hip osteoarthritis. IMPRESSION: 1. 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 2. Nodal metastasis within the chest and supraclavicular/low jugular regions. 3. Multifocal osseous metastasis. Possibly pathologic fracture involving the posteromedial right tenth rib. 4. New left lower lobe opacity with mild hypermetabolism, suspicious for infection or postobstructive pneumonitis. 5. Cholelithiasis. Electronically Signed   By: Abigail Miyamoto M.D.   On: 07/09/2016 13:17   US Biopsy  Result Date: 07/19/2016 CLINICAL DATA:  Left suprahilar central lung mass with lymphadenopathy, including hypermetabolic right supraclavicular lymph nodes. EXAM: ULTRASOUND GUIDED CORE BIOPSY OF RIGHT SUPRACLAVICULAR LYMPH NODE MEDICATIONS: 2.0 mg IV Versed; 50 mcg  IV Fentanyl Total Moderate Sedation Time: 15 minutes. The patient's level of consciousness and physiologic status were continuously monitored during the procedure by Radiology nursing. PROCEDURE: The procedure, risks, benefits, and alternatives were explained to the patient. Questions regarding the procedure were encouraged and answered. The patient understands and consents to the procedure. A time out was performed prior to initiating the procedure. The right neck was prepped with chlorhexidine in a sterile fashion, and a sterile drape was applied covering the operative field. A sterile gown and sterile gloves were used for the procedure. Local anesthesia was provided  with 1% Lidocaine. Ultrasound was performed of the right neck. Under direct ultrasound guidance, 18 gauge core biopsy samples were obtained at the level of supraclavicular lymph nodes. Three core biopsy samples were submitted in formalin. Two samples were placed on Telfa soaked with saline. COMPLICATIONS: None. FINDINGS: A cluster of abnormal appearing supraclavicular lymph nodes is present in the right lower neck. Core biopsy samples were obtained yielding solid tissue. IMPRESSION: Ultrasound-guided core biopsy performed of a cluster of adjacent right supraclavicular lymph nodes. Electronically Signed   By: Aletta Edouard M.D.   On: 07/19/2016 17:10   Dg Chest Port 1 View  Result Date: 06/28/2016 CLINICAL DATA:  Chest pain EXAM: PORTABLE CHEST 1 VIEW COMPARISON:  09/28/2014 FINDINGS: Prior CABG. Heart is normal size. Mild peribronchial thickening. No confluent opacities. There is fullness in the left hilum. Cannot exclude adenopathy. IMPRESSION: Left hilar fullness. Cannot exclude adenopathy. Recommend chest CT with IV contrast for further evaluation. Bronchitic changes. Prior CABG. Electronically Signed   By: Rolm Baptise M.D.   On: 06/28/2016 10:19    Assessment and plan- Patient is a 56 y.o. male with new diagnosis of extensive stage small  cell lung cancer   1. Patient admitted to get neurology work up as an inpatient. Patients symptoms are not continuous and are positional which argues against malignancy as a cause. Dr. Mike Gip to decide about LP in AM after speaking to Dr. Doy Mince. He will get port placed hopefully as an inpatient that he was supposed to get as an outpatient. Chemo teach tomorrow as well and hopefully start chemo early next week  Dr. Mike Gip will f/u with patient tomorrow   Thank you for this kind referral and the opportunity to participate in the care of this patient   Visit Diagnosis 1. Cerebrovascular accident (CVA), unspecified mechanism ([place])   2. Stroke (cerebrum) ([place])   3. Stroke (cerebrum) ([place])     Dr. Randa Evens, MD, MPH [facility] Pager- [phone] 07/26/2016  5:21 PM

## 2016-07-26 NOTE — Consult Note (Signed)
Referring Physician: Bridgett Larsson    Chief Complaint: Left facial burning  HPI: Philip [name] is an 56 y.o. male with stage IV lung cancer who was undergoing an screening MRI of the brain who was sent to the ED due to abnormal findings.  The patient reports that for the past few days he has been experiencing buring along the left side of his face when he lies down.  This is accompanied by a severe headache.  Symptoms are relieved when he sits up.  He has had no other neurological complaints.    Date last known well: Unable to determine Time last known well: Unable to determine tPA Given: No: Unable to determine LKW  Past Medical History:  Diagnosis Date  . Anemia   . CAD (coronary artery disease)    a. 08/2014 Inf STEMI/CABG x 3 (LIMA->LAD, VG->Diag, RIMA->RCA).  . DVT, recurrent, lower extremity, acute, left ([place]) 2016  . Hyperlipidemia   . Hypertension   . Hypertensive heart disease   . Lung cancer ([place])   . PAD (peripheral artery disease) ([place])    a. 03/2015 Periph Angio: short occlusion of L pop w/ evidence of embolization into the DP->5.0x50 mm Inova self-expanding stent;  b. 04/08/2015 ABI: R 1.12, L 0.99.  . Tobacco abuse   . Toe amputation status, left [facility]) 07/09/2016   2017    Past Surgical History:  Procedure Laterality Date  . CARDIAC CATHETERIZATION N/A 08/24/2014   Procedure: Left Heart Cath and Coronary Angiography;  Surgeon: Isaias Cowman, MD;  Location: [name] [facility];  Service: Cardiovascular;  Laterality: N/A;  . CARDIAC CATHETERIZATION N/A 08/24/2014   Procedure: Coronary Stent Intervention;  Surgeon: Isaias Cowman, MD;  Location: [place] [facility];  Service: Cardiovascular;  Laterality: N/A;  . CORONARY ARTERY BYPASS GRAFT N/A 08/25/2014   Procedure: CORONARY ARTERY BYPASS GRAFTING (CABG), ON PUMP, TIMES THREE, USING BILATERAL MAMMARY ARTERIES, RIGHT GREATER SAPHENOUS VEIN HARVESTED ENDOSCOPICALLY;  Surgeon: Melrose Nakayama, MD;  Location: [place];  Service: Open Heart Surgery;  Laterality: N/A;  . PERIPHERAL VASCULAR CATHETERIZATION N/A 03/30/2015   Procedure: Abdominal Aortogram w/Lower Extremity;  Surgeon: Wellington Hampshire, MD;  Location: [place] [facility];  Service: Cardiovascular;  Laterality: N/A;  . TEE WITHOUT CARDIOVERSION N/A 08/25/2014   Procedure: TRANSESOPHAGEAL ECHOCARDIOGRAM (TEE);  Surgeon: Melrose Nakayama, MD;  Location: [place];  Service: Open Heart Surgery;  Laterality: N/A;    Family History  Problem Relation Age of Onset  . Hypertension Father   . Lung cancer Father   . Heart attack Mother 1    STENT  . Hyperlipidemia Mother   . Heart attack Brother   . Heart attack Brother   . Breast cancer Sister   . Heart attack Sister   . Colon cancer Paternal Grandfather    Social History:  reports that he has been smoking Cigarettes.  He has been smoking about 0.50 packs per day. He has never used smokeless tobacco. He reports that he drinks alcohol. He reports that he does not use drugs.  Allergies:  Allergies  Allergen Reactions  . Amitriptyline Other (See Comments)    Burning sensation all over  . Contrast Media [Iodinated Diagnostic Agents] Other (See Comments)    Pt states that he got "knots behind his ears".  . Nsaids Other (See Comments)    Other reaction(s): Other (See Comments) Reaction:  Blood in stool  Reaction:  Blood in stool   . Ibuprofen Other (See Comments) and  Nausea Only    Blood in stools    Medications: I have reviewed the patient's current medications. Prior to Admission:  Prior to Admission medications   Medication Sig Start Date End Date Taking? Authorizing Provider  atorvastatin (LIPITOR) 40 MG tablet Take 1 tablet (40 mg total) by mouth daily. 05/19/15  Yes Rogelia Mire, NP  chlorpheniramine-HYDROcodone [facility]) 10-8 MG/5ML SUER Take 5 mLs by mouth every 12 (twelve) hours as needed for cough. 07/25/16  Yes Lequita Asal, MD  clopidogrel (PLAVIX) 75  MG tablet Take 75 mg by mouth daily.   Yes Historical Provider, MD  diphenhydrAMINE (BENADRYL) 25 mg capsule Take 25 mg by mouth 2 (two) times daily as needed for allergies.    Yes Historical Provider, MD  fluticasone (FLONASE) 50 MCG/ACT nasal spray Place 1 spray into both nostrils daily. Patient taking differently: Place 1 spray into both nostrils daily as needed for allergies.  09/14/14 07/26/16 Yes Tasrif Ahmed, MD  HYDROcodone-acetaminophen (NORCO/VICODIN) 5-325 MG tablet Take 1 tablet by mouth every 6 (six) hours as needed for moderate pain. 07/25/16  Yes Lequita Asal, MD  lisinopril (PRINIVIL,ZESTRIL) 5 MG tablet TAKE 1 TABLET BY MOUTH DAILY 06/11/16  Yes Rogelia Mire, NP  metoprolol tartrate (LOPRESSOR) 25 MG tablet Take 1 tablet (25 mg total) by mouth 2 (two) times daily. 07/22/15  Yes Minna Merritts, MD  omeprazole (PRILOSEC) 20 MG capsule Take 20 mg by mouth daily.   Yes Historical Provider, MD  predniSONE (DELTASONE) 50 MG tablet Take 50 mg by mouth daily with breakfast.   Yes Historical Provider, MD     ROS: History obtained from the patient  General ROS: negative for - chills, fatigue, fever, night sweats, weight gain or weight loss Psychological ROS: negative for - behavioral disorder, hallucinations, memory difficulties, mood swings or suicidal ideation Ophthalmic ROS: negative for - blurry vision, double vision, eye pain or loss of vision ENT ROS: negative for - epistaxis, nasal discharge, oral lesions, sore throat, tinnitus or vertigo Allergy and Immunology ROS: negative for - hives or itchy/watery eyes Hematological and Lymphatic ROS: negative for - bleeding problems, bruising or swollen lymph nodes Endocrine ROS: negative for - galactorrhea, hair pattern changes, polydipsia/polyuria or temperature intolerance Respiratory ROS: cough Cardiovascular ROS: negative for - chest pain, dyspnea on exertion, edema or irregular heartbeat Gastrointestinal ROS: negative for -  abdominal pain, diarrhea, hematemesis, nausea/vomiting or stool incontinence Genito-Urinary ROS: negative for - dysuria, hematuria, incontinence or urinary frequency/urgency Musculoskeletal ROS: negative for - joint swelling or muscular weakness Neurological ROS: as noted in HPI Dermatological ROS: negative for rash and skin lesion changes  Physical Examination: Blood pressure 117/73, pulse 96, resp. rate 17, height '5\' 11"'$  (1.803 m), weight 68.9 kg (152 lb), SpO2 95 %.  HEENT-  Normocephalic, no lesions, without obvious abnormality.  Normal external eye and conjunctiva.  Normal TM's bilaterally.  Normal auditory canals and external ears. Normal external nose, mucus membranes and septum.  Normal pharynx. Cardiovascular- S1, S2 normal, pulses palpable throughout   Lungs- wheezes Abdomen- soft, non-tender; bowel sounds normal; no masses,  no organomegaly Extremities- no edema Lymph-no adenopathy palpable Musculoskeletal-no joint tenderness, deformity or swelling Skin-warm and dry, no hyperpigmentation, vitiligo, or suspicious lesions  Neurological Examination   Mental Status: Alert, oriented, thought content appropriate.  Speech fluent without evidence of aphasia.  Able to follow 3 step commands without difficulty. Cranial Nerves: II: Discs flat bilaterally; Visual fields grossly normal, pupils equal, round, reactive to  light and accommodation III,IV, VI: ptosis not present, extra-ocular motions intact bilaterally V,VII: smile symmetric, facial light touch sensation normal bilaterally VIII: hearing normal bilaterally IX,X: gag reflex present XI: bilateral shoulder shrug XII: midline tongue extension Motor: Right : Upper extremity   5/5    Left:     Upper extremity   5/5  Lower extremity   5/5     Lower extremity   5/5 Tone and bulk:normal tone throughout; no atrophy noted Sensory: Pinprick and light touch intact throughout, bilaterally Deep Tendon Reflexes: 2+ and symmetric with absent  AJ's bilaterally Plantars: Right: mute   Left: mute Cerebellar: Normal finger-to-nose and normal heel-to-shin testing bilaterally Gait: normal gait and station    Laboratory Studies:  Basic Metabolic Panel:  Recent Labs Lab 07/26/16 1225  NA 126*  K 4.5  CL 96*  CO2 22  GLUCOSE 155*  BUN 8  CREATININE 0.76  CALCIUM 9.0    Liver Function Tests:  Recent Labs Lab 07/26/16 1225  AST 26  ALT 16*  ALKPHOS 72  BILITOT 0.5  PROT 6.6  ALBUMIN 3.4*   No results for input(s): LIPASE, AMYLASE in the last 168 hours. No results for input(s): AMMONIA in the last 168 hours.  CBC:  Recent Labs Lab 07/19/16 1427 07/26/16 1225  WBC 4.7 3.6*  NEUTROABS  --  3.1  HGB 14.1 13.7  HCT 39.8* 40.2  MCV 93.6 88.8  PLT 208 236    Cardiac Enzymes:  Recent Labs Lab 07/26/16 1225  TROPONINI <0.03    BNP: Invalid input(s): POCBNP  CBG: No results for input(s): GLUCAP in the last 168 hours.  Microbiology: Results for orders placed or performed during the hospital encounter of 06/28/16  Culture, blood (routine x 2)     Status: None   Collection Time: 06/28/16  9:57 AM  Result Value Ref Range Status   Specimen Description BLOOD L AC  Final   Special Requests BOTTLES DRAWN AEROBIC AND ANAEROBIC [place]  Final   Culture NO GROWTH 5 DAYS  Final   Report Status 07/03/2016 FINAL  Final  Culture, blood (routine x 2)     Status: None   Collection Time: 06/28/16  9:57 AM  Result Value Ref Range Status   Specimen Description BLOOD L AC  Final   Special Requests BOTTLES DRAWN AEROBIC AND ANAEROBIC [place]  Final   Culture NO GROWTH 5 DAYS  Final   Report Status 07/03/2016 FINAL  Final    Coagulation Studies:  Recent Labs  07/26/16 1225  LABPROT 12.7  INR 0.95    Urinalysis: No results for input(s): COLORURINE, LABSPEC, PHURINE, GLUCOSEU, HGBUR, BILIRUBINUR, KETONESUR, PROTEINUR, UROBILINOGEN, NITRITE, LEUKOCYTESUR in the last 168 hours.  Invalid input(s):  APPERANCEUR  Lipid Panel: No results found for: CHOL, TRIG, HDL, CHOLHDL, VLDL, LDLCALC  HgbA1C:  Lab Results  Component Value Date   HGBA1C 5.4 06/09/2015    Urine Drug Screen:  No results found for: LABOPIA, COCAINSCRNUR, LABBENZ, AMPHETMU, THCU, LABBARB  Alcohol Level: No results for input(s): ETH in the last 168 hours.  Other results: EKG: sinus rhythm at 94 bpm.  Imaging: Dg Chest 2 View  Result Date: 07/26/2016 CLINICAL DATA:  Shortness of breath, cough and chest pain. Stage IV lung cancer. EXAM: CHEST  2 VIEW COMPARISON:  06/28/2016 FINDINGS: Heart size is normal. Previous median sternotomy and CABG. Right lung is clear and right chest is normal. There is slight decrease in left hilar mass and superior mediastinal adenopathy by radiography.  Minimal left base atelectasis and small left effusion. No other new finding. IMPRESSION: Small left effusion with mild left base atelectasis newly seen. Slight radiographic reduction of left hilar mass and superior mediastinal adenopathy. Electronically Signed   By: Nelson Chimes M.D.   On: 07/26/2016 09:52   Mr Jeri Cos AV Contrast  Result Date: 07/26/2016 CLINICAL DATA:  Recent diagnosis of lung cancer. Left-sided facial burning when laying down. EXAM: MRI HEAD WITHOUT AND WITH CONTRAST TECHNIQUE: Multiplanar, multiecho pulse sequences of the brain and surrounding structures were obtained without and with intravenous contrast. CONTRAST:  63m MULTIHANCE GADOBENATE DIMEGLUMINE 529 MG/ML IV SOLN COMPARISON:  None. FINDINGS: Brain: No enhancement or swelling to suggest metastatic disease. There is a punctate DWI hyperintensity along high right frontal gyrus, presumably an incidental infarct. No prior infarct or notable microvascular ischemic change. No hemorrhage or hydrocephalus. Vascular: Preserved flow voids Skull and upper cervical spine: 1 cm marrow lesion in the upper occipital bone. Patient has known osseous metastatic disease. No evidence of  intracranial invasion. Sinuses/Orbits: Negative.  No mass. No fat effacement or abnormal enhancement along the course of the left trigeminal nerve. These results will be called to the ordering clinician or representative by the Radiologist Assistant, and communication documented in the PACS or zVision Dashboard. IMPRESSION: 1. No evidence of brain metastasis. No explanation for left face symptoms. 2. Punctate DWI hyperintensity along a high right frontal gyrus, possible recent infarct. 3. Small occipital bone metastasis. Electronically Signed   By: JMonte FantasiaM.D.   On: 07/26/2016 09:40    Assessment: 56y.o. male s/p MRI of the brain who complains of positional left facial burning.  MRI of the brain reviewed and shows a high frontal gyrus area of diffusion hyperintensity.  Concern is for an acute infarct.  An bony occipital metastasis is noted as well.  Although DWI may be secondary to hypercoagulable state symptoms are unusual and not consistent with what would be expected for an infarct.  Would consider CNS spread of cancer.    Stroke Risk Factors - hyperlipidemia, hypertension and smoking  Plan: 1. HgbA1c, fasting lipid panel 2. Smoking cessation counseling 3. PT consult, OT consult, Speech consult 4. Echocardiogram 5. Carotid dopplers 6. Prophylactic therapy-Antiplatelet med: Aspirin - dose '325mg'$  daily 7. NPO until RN stroke swallow screen 8. Telemetry monitoring 9. Frequent neuro checks 10.  Will speak with the patient's oncologist today.  Concern is need for LP.  Will discuss therefore would hold Lovenox and/or heparin for now.  Recommend SCD's.      LAlexis Goodell MD Neurology [phone]/03/2017, 1:51 PM

## 2016-07-27 ENCOUNTER — Inpatient Hospital Stay: Payer: Medicaid Other

## 2016-07-27 ENCOUNTER — Encounter
Admission: EM | Disposition: A | Discharge status: Home / Self Care | Payer: Self-pay | Source: Home / Self Care | Attending: Internal Medicine

## 2016-07-27 ENCOUNTER — Ambulatory Visit: Admission: RE | Admit: 2016-07-27 | Payer: Self-pay | Source: Ambulatory Visit | Admitting: Vascular Surgery

## 2016-07-27 ENCOUNTER — Inpatient Hospital Stay (HOSPITAL_COMMUNITY)
Admit: 2016-07-27 | Discharge: 2016-07-27 | Disposition: A | Discharge status: Home / Self Care | Payer: Medicaid Other | Attending: Internal Medicine | Admitting: Internal Medicine

## 2016-07-27 ENCOUNTER — Encounter: Payer: Self-pay | Admitting: *Deleted

## 2016-07-27 DIAGNOSIS — I6350 Cerebral infarction due to unspecified occlusion or stenosis of unspecified cerebral artery: Secondary | ICD-10-CM

## 2016-07-27 DIAGNOSIS — C3490 Malignant neoplasm of unspecified part of unspecified bronchus or lung: Secondary | ICD-10-CM

## 2016-07-27 HISTORY — PX: PORTA CATH INSERTION: CATH118285

## 2016-07-27 LAB — LIPID PANEL
Cholesterol: 182 mg/dL (ref 0–200)
HDL: 47 mg/dL (ref >40)
LDL CALC: 112 mg/dL — AB (ref 0–99)
Total CHOL/HDL Ratio: 3.9 RATIO
Triglycerides: 115 mg/dL (ref <150)
VLDL: 23 mg/dL (ref 0–40)

## 2016-07-27 LAB — ECHOCARDIOGRAM COMPLETE
HEIGHTINCHES: 71 in
WEIGHTICAEL: 2409.6 [oz_av]

## 2016-07-27 LAB — HIV ANTIBODY (ROUTINE TESTING W REFLEX): HIV SCREEN 4TH GENERATION: NONREACTIVE

## 2016-07-27 SURGERY — PORTA CATH INSERTION
Anesthesia: Moderate Sedation

## 2016-07-27 MED ORDER — FENTANYL CITRATE (PF) 100 MCG/2ML IJ SOLN
INTRAMUSCULAR | Status: AC
  Filled 2016-07-27: qty 2

## 2016-07-27 MED ORDER — METHYLPREDNISOLONE SODIUM SUCC 125 MG IJ SOLR
INTRAMUSCULAR | Status: AC
  Administered 2016-07-27: 125 mg
  Filled 2016-07-27: qty 2

## 2016-07-27 MED ORDER — ONDANSETRON HCL 4 MG/2ML IJ SOLN: 4.0000 mg | Freq: Four times a day (QID) | INTRAMUSCULAR | Status: DC | PRN

## 2016-07-27 MED ORDER — CEFAZOLIN IN D5W 1 GM/50ML IV SOLN
1.0000 g | INTRAVENOUS | Status: DC
  Filled 2016-07-27: qty 50

## 2016-07-27 MED ORDER — HYDROMORPHONE HCL 1 MG/ML IJ SOLN
1.0000 mg | Freq: Once | INTRAMUSCULAR | Status: AC | PRN
  Administered 2016-07-27: 1 mg via INTRAVENOUS

## 2016-07-27 MED ORDER — FAMOTIDINE 20 MG PO TABS
ORAL_TABLET | ORAL | Status: AC
  Administered 2016-07-27: 20 mg
  Filled 2016-07-27: qty 1

## 2016-07-27 MED ORDER — GENTAMICIN SULFATE 40 MG/ML IJ SOLN
Freq: Once | INTRAMUSCULAR | Status: DC
  Filled 2016-07-27: qty 2

## 2016-07-27 MED ORDER — LORAZEPAM 2 MG/ML IJ SOLN
2.0000 mg | Freq: Once | INTRAMUSCULAR | Status: AC
  Administered 2016-07-27: 2 mg via INTRAVENOUS
  Filled 2016-07-27: qty 1

## 2016-07-27 MED ORDER — MIDAZOLAM HCL 5 MG/5ML IJ SOLN
INTRAMUSCULAR | Status: AC
  Filled 2016-07-27: qty 5

## 2016-07-27 MED ORDER — CEFAZOLIN IN D5W 1 GM/50ML IV SOLN
1.0000 g | Freq: Once | INTRAVENOUS | Status: DC
  Filled 2016-07-27: qty 50

## 2016-07-27 MED ORDER — SODIUM CHLORIDE 0.9 % IR SOLN
Freq: Once | Status: DC
  Filled 2016-07-27: qty 2

## 2016-07-27 MED ORDER — LIDOCAINE-EPINEPHRINE (PF) 2 %-1:200000 IJ SOLN
INTRAMUSCULAR | Status: AC
  Filled 2016-07-27: qty 20

## 2016-07-27 MED ORDER — HYDROMORPHONE HCL 1 MG/ML IJ SOLN
INTRAMUSCULAR | Status: AC
  Filled 2016-07-27: qty 1

## 2016-07-27 MED ORDER — SODIUM CHLORIDE 0.9 % IR SOLN
Status: DC
  Filled 2016-07-27: qty 2

## 2016-07-27 MED ORDER — HEPARIN (PORCINE) IN NACL 2-0.9 UNIT/ML-% IJ SOLN
INTRAMUSCULAR | Status: AC
  Filled 2016-07-27: qty 500

## 2016-07-27 MED ORDER — CEFAZOLIN IN D5W 1 GM/50ML IV SOLN
1.0000 g | Freq: Once | INTRAVENOUS | Status: AC
  Administered 2016-07-27: 1 g via INTRAVENOUS

## 2016-07-27 MED ORDER — FENTANYL CITRATE (PF) 100 MCG/2ML IJ SOLN
INTRAMUSCULAR | Status: DC | PRN
  Administered 2016-07-27 (×2): 50 ug via INTRAVENOUS

## 2016-07-27 MED ORDER — SODIUM CHLORIDE 0.9 % IV SOLN: INTRAVENOUS | Status: DC

## 2016-07-27 MED ORDER — MIDAZOLAM HCL 2 MG/2ML IJ SOLN
INTRAMUSCULAR | Status: DC | PRN
  Administered 2016-07-27: 1 mg via INTRAVENOUS
  Administered 2016-07-27: 2 mg via INTRAVENOUS
  Administered 2016-07-27 (×2): 1 mg via INTRAVENOUS

## 2016-07-27 MED ORDER — CEFEPIME-DEXTROSE 1 GM/50ML IV SOLR: 1.0000 g | INTRAVENOUS | Status: DC

## 2016-07-27 SURGICAL SUPPLY — 7 items
KIT PORT POWER 8FR ISP CVUE (Catheter) ×3 IMPLANT
PACK ANGIOGRAPHY (CUSTOM PROCEDURE TRAY) ×3 IMPLANT
PAD GROUND ADULT SPLIT (MISCELLANEOUS) ×3 IMPLANT
PENCIL ELECTRO HAND CTR (MISCELLANEOUS) ×3 IMPLANT
SUT MNCRL AB 4-0 PS2 18 (SUTURE) ×3 IMPLANT
SUT PROLENE 0 CT 1 30 (SUTURE) ×3 IMPLANT
SUTURE VIC 3-0 (SUTURE) ×3 IMPLANT

## 2016-07-27 NOTE — Discharge Summary (Signed)
[facility] [facility] [place] at [facility], 56 y.o., DOB 06/27/1960, MRN [id number]. Admission date: 07/26/2016 Discharge Date 07/27/2016 Primary MD [name], MD Admitting Physician Demetrios Loll, MD  Admission Diagnosis  Cerebrovascular accident (CVA), unspecified mechanism [facility]) [I63.9]  Discharge Diagnosis   Active Problems:   Acute CVA (cerebrovascular accident) [facility])   Lung cancer   Anemia   Coronary artery disease   History of DVT   Essential hypertension   Hyperlipidemia   Peripheral arterial disease        Hospital Course JHOVANNY GUINTA is an 56 y.o. male with stage IV lung cancer who was undergoing an screening MRI of the brain who was sent to the ED due to abnormal findings.  The patient reports that for the past few days he has been experiencing buring along the left side of his face when he lies down.  This is accompanied by a severe headache.  Symptoms are relieved when he sits up.  He has had no other neurological complaints.   patient was seen by neurology and recommended for admission. Patient underwent MRA brain which showed no significant arterial compromise. Radical ultrasound showed no significant stenosis. Echo showed mild systolic dysfunction. But no evidence of thrombus. Patient currently asymptomatic. He was supposed to have a Port-A-Cath placement which she will have prior to discharge. He was very agitated and wanted to go home earlier today but we were able to give him some Ativan and convinced him to stay in the hospital.            Consults  neurology  Significant Tests:  See full reports for all details     Dg Chest 2 View  Result Date: 07/26/2016 CLINICAL DATA:  Shortness of breath, cough and chest pain. Stage IV lung cancer. EXAM: CHEST  2 VIEW COMPARISON:  06/28/2016 FINDINGS: Heart size is normal. Previous median sternotomy and CABG. Right lung is clear and right chest is normal. There is slight decrease in  left hilar mass and superior mediastinal adenopathy by radiography. Minimal left base atelectasis and small left effusion. No other new finding. IMPRESSION: Small left effusion with mild left base atelectasis newly seen. Slight radiographic reduction of left hilar mass and superior mediastinal adenopathy. Electronically Signed   By: Nelson Chimes M.D.   On: 07/26/2016 09:52   Ct Chest Wo Contrast  Result Date: 06/28/2016 CLINICAL DATA:  Adenopathy appreciable on chest radiograph EXAM: CT CHEST WITHOUT CONTRAST TECHNIQUE: Multidetector CT imaging of the chest was performed following the standard protocol without IV contrast. COMPARISON:  Chest radiograph June 28, 2016 FINDINGS: Cardiovascular: There is no appreciable thoracic aortic aneurysm. There is mild atherosclerotic calcification involving the proximal great vessels. There is atherosclerotic calcification throughout the aorta. There are scattered foci of coronary artery calcification. Patient is status post coronary artery bypass grafting. Pericardium is rather minimally thickened. Mediastinum/Nodes: Thyroid appears unremarkable. There is extensive adenopathy throughout the mediastinum. Multiple lymph nodes are noted adjacent to the descending thoracic aorta, largest measuring 2.1 x 1.7 cm. There is adenopathy in the aortopulmonary window region with the largest confluence of lymph nodes in this area measuring 4.0 x 2.6 cm. There is hilar adenopathy on the left measuring 2.0 x 1.9 cm. There are multiple enlarged lymph nodes in the pretracheal region, largest measuring 1.7 x 1.7 cm. There are very carinal lymph nodes, largest just to the left of the carina measuring 2.8 x 1.9 cm. There is a sub- carinal lymph  node measuring 2.4 x 1.6 cm. Several other enlarged lymph nodes are noted in the mediastinum with the greatest concentration of lymph nodes in the precarinal region. Lungs/Pleura: There is evidence of bullae in the upper lobes, somewhat more on the  right than on the left. There is scarring in the posterior left base region. There are areas of mild atelectatic change in the left upper lobe. There is no airspace consolidation or edema. On axial slice 47 series 3, there is a 4 x 3 mm nodular opacity in the anterior segment of the left upper lobe. No larger pulmonary nodular lesions are evident. Upper Abdomen: In the visualized upper abdomen, there is cholelithiasis. Gallbladder wall does not appear appreciably thickened. There is atherosclerotic calcification in the aorta. Visualized upper abdominal structures otherwise appear unremarkable. Musculoskeletal: Patient is status post median sternotomy. There are no evident blastic or lytic bone lesions. IMPRESSION: Extensive adenopathy. This degree of adenopathy raises concern for potential neoplasia. Lymphoma in particular is a differential consideration given the degree of adenopathy present. Scattered bullae in the lung apices, more on the right than on the left. 4 x 3 mm nodular opacity anterior segment left upper lobe. No other pulmonary nodular lesion evident. Multiple foci of atherosclerotic calcification as well as foci of coronary artery calcification. Patient has had coronary artery bypass grafting. Cholelithiasis. Electronically Signed   By: Lowella Grip III M.D.   On: 06/28/2016 14:54   Mr Jeri Cos ZO Contrast  Result Date: 07/26/2016 CLINICAL DATA:  Recent diagnosis of lung cancer. Left-sided facial burning when laying down. EXAM: MRI HEAD WITHOUT AND WITH CONTRAST TECHNIQUE: Multiplanar, multiecho pulse sequences of the brain and surrounding structures were obtained without and with intravenous contrast. CONTRAST:  52m MULTIHANCE GADOBENATE DIMEGLUMINE 529 MG/ML IV SOLN COMPARISON:  None. FINDINGS: Brain: No enhancement or swelling to suggest metastatic disease. There is a punctate DWI hyperintensity along high right frontal gyrus, presumably an incidental infarct. No prior infarct or notable  microvascular ischemic change. No hemorrhage or hydrocephalus. Vascular: Preserved flow voids Skull and upper cervical spine: 1 cm marrow lesion in the upper occipital bone. Patient has known osseous metastatic disease. No evidence of intracranial invasion. Sinuses/Orbits: Negative.  No mass. No fat effacement or abnormal enhancement along the course of the left trigeminal nerve. These results will be called to the ordering clinician or representative by the Radiologist Assistant, and communication documented in the PACS or zVision Dashboard. IMPRESSION: 1. No evidence of brain metastasis. No explanation for left face symptoms. 2. Punctate DWI hyperintensity along a high right frontal gyrus, possible recent infarct. 3. Small occipital bone metastasis. Electronically Signed   By: JMonte FantasiaM.D.   On: 07/26/2016 09:40   [place]Carotid Bilateral (at [facility] Only)  Result Date: 07/27/2016 CLINICAL DATA:  Stroke EXAM: BILATERAL CAROTID DUPLEX ULTRASOUND TECHNIQUE: GPearline Cablesscale imaging, color Doppler and duplex ultrasound were performed of bilateral carotid and vertebral arteries in the neck. COMPARISON:  None. FINDINGS: Criteria: Quantification of carotid stenosis is based on velocity parameters that correlate the residual internal carotid diameter with NASCET-based stenosis levels, using the diameter of the distal internal carotid lumen as the denominator for stenosis measurement. The following velocity measurements were obtained: RIGHT ICA:  101 cm/sec CCA:  87 cm/sec SYSTOLIC ICA/CCA RATIO:  1.2 DIASTOLIC ICA/CCA RATIO:  1.8 ECA:  105 cm/sec LEFT ICA:  93 cm/sec CCA:  96 cm/sec SYSTOLIC ICA/CCA RATIO:  1.0 DIASTOLIC ICA/CCA RATIO:  1.5 ECA:  124 cm/sec RIGHT CAROTID  ARTERY: Mild calcified plaque in the bulb. Low resistance internal carotid Doppler pattern. RIGHT VERTEBRAL ARTERY:  Antegrade. LEFT CAROTID ARTERY: Mild calcified plaque in the bulb. Low resistance internal carotid Doppler pattern. LEFT VERTEBRAL  ARTERY:  Antegrade. IMPRESSION: Less than 50% stenosis in the right and left internal carotid arteries. Electronically Signed   By: Marybelle Killings M.D.   On: 07/27/2016 10:39   Nm Pet Image Initial (pi) Skull Base To Thigh  Result Date: 07/09/2016 CLINICAL DATA:  Initial treatment strategy for weight loss with night sweats and mediastinal adenopathy. Smoking history. EXAM: NUCLEAR MEDICINE PET SKULL BASE TO THIGH TECHNIQUE: 13.1 mCi F-18 FDG was injected intravenously. Full-ring PET imaging was performed from the skull base to thigh after the radiotracer. CT data was obtained and used for attenuation correction and anatomic localization. FASTING BLOOD GLUCOSE:  Value: 99 mg/dl COMPARISON:  Chest CT 06/28/2016. FINDINGS: NECK Bilateral low jugular/ supraclavicular nodal metastasis. Example right-sided node which measures 9 mm and a S.U.V. max of 5.1 on image 66/series 3. Bilateral carotid atherosclerosis. CHEST Central left suprahilar and upper lobe infiltrative lung primary. This measures on the order of 6.9 x 4.4 cm and a S.U.V. max of 13.4 on image 106/series 3. Contralateral mediastinal nodal metastasis, with an index right paratracheal node measuring 1.9 cm and a S.U.V. max of 10.7 on image 95/series 3. Prevascular nodes measure up to 1.4 cm and a S.U.V. max of 10.2 on image 92/series 3. Mild hypermetabolism corresponding to ill-defined left lower lobe peribronchovascular opacity, including on image 128/series 3. This is new, suspicious for infection or postobstructive pneumonitis. Prior median sternotomy. Mild cardiomegaly. Centrilobular and paraseptal emphysema. 4 mm left upper lobe pulmonary nodule is below PET resolution. ABDOMEN/PELVIS No abdominopelvic parenchymal or nodal hypermetabolism. Gallstones of maximally 9 mm. Abdominal aortic atherosclerosis. Scattered colonic diverticula. SKELETON Multifocal hypermetabolic osseous metastasis. Focus of hypermetabolism within the tenth posteromedial right rib  likely corresponds to a nondisplaced fracture on image 132/ series 3. This measures a S.U.V. max of 4.5. There is a left iliac area of vague hypermetabolism an subtle sclerosis which measures a S.U.V. max of 4.3 on image 216/ series 3. Subtle hypermetabolism involves the C5 vertebral body, measuring a S.U.V. max of 3.6. Left hip osteoarthritis. IMPRESSION: 1. 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 2. Nodal metastasis within the chest and supraclavicular/low jugular regions. 3. Multifocal osseous metastasis. Possibly pathologic fracture involving the posteromedial right tenth rib. 4. New left lower lobe opacity with mild hypermetabolism, suspicious for infection or postobstructive pneumonitis. 5. Cholelithiasis. Electronically Signed   By: Abigail Miyamoto M.D.   On: 07/09/2016 13:17   US Biopsy  Result Date: 07/19/2016 CLINICAL DATA:  Left suprahilar central lung mass with lymphadenopathy, including hypermetabolic right supraclavicular lymph nodes. EXAM: ULTRASOUND GUIDED CORE BIOPSY OF RIGHT SUPRACLAVICULAR LYMPH NODE MEDICATIONS: 2.0 mg IV Versed; 50 mcg IV Fentanyl Total Moderate Sedation Time: 15 minutes. The patient's level of consciousness and physiologic status were continuously monitored during the procedure by Radiology nursing. PROCEDURE: The procedure, risks, benefits, and alternatives were explained to the patient. Questions regarding the procedure were encouraged and answered. The patient understands and consents to the procedure. A time out was performed prior to initiating the procedure. The right neck was prepped with chlorhexidine in a sterile fashion, and a sterile drape was applied covering the operative field. A sterile gown and sterile gloves were used for the procedure. Local anesthesia was provided with 1% Lidocaine. Ultrasound was performed of the right neck. Under direct  ultrasound guidance, 18 gauge core biopsy samples were obtained at the level of supraclavicular  lymph nodes. Three core biopsy samples were submitted in formalin. Two samples were placed on Telfa soaked with saline. COMPLICATIONS: None. FINDINGS: A cluster of abnormal appearing supraclavicular lymph nodes is present in the right lower neck. Core biopsy samples were obtained yielding solid tissue. IMPRESSION: Ultrasound-guided core biopsy performed of a cluster of adjacent right supraclavicular lymph nodes. Electronically Signed   By: Aletta Edouard M.D.   On: 07/19/2016 17:10   Dg Chest Port 1 View  Result Date: 06/28/2016 CLINICAL DATA:  Chest pain EXAM: PORTABLE CHEST 1 VIEW COMPARISON:  09/28/2014 FINDINGS: Prior CABG. Heart is normal size. Mild peribronchial thickening. No confluent opacities. There is fullness in the left hilum. Cannot exclude adenopathy. IMPRESSION: Left hilar fullness. Cannot exclude adenopathy. Recommend chest CT with IV contrast for further evaluation. Bronchitic changes. Prior CABG. Electronically Signed   By: Rolm Baptise M.D.   On: 06/28/2016 10:19   Mr Jodene Nam Head/brain CN Cm  Result Date: 07/27/2016 CLINICAL DATA:  Hypertension. Recent demonstration of small focus of restricted diffusion in knee right posterior frontal vertex. EXAM: MRA HEAD WITHOUT CONTRAST TECHNIQUE: Angiographic images of the Circle of Willis were obtained using MRA technique without intravenous contrast. COMPARISON:  07/26/2016 FINDINGS: Both internal carotid arteries are widely patent into the brain. Mild irregularity in the siphon regions but no stenosis. Anterior and middle cerebral vessels are patent without proximal stenosis, aneurysm or vascular malformation. Both vertebral arteries are widely patent to the basilar. No basilar stenosis. Posterior circulation branch vessels appear normal. IMPRESSION: Normal intracranial MR angiography of the large and medium size vessels. I think the finding on parenchymal imaging yesterday could be a small infarction, but there is considerable concern for the  potential of a nonenhancing metastasis. Electronically Signed   By: Nelson Chimes M.D.   On: 07/27/2016 10:50       Today   Subjective:   Edsel Petrin  patient feels well anxious to go home  Objective:   Blood pressure 132/78, pulse 72, temperature 97.8 F (36.6 C), temperature source Oral, resp. rate 16, height '5\' 11"'$  (1.803 m), weight 150 lb 9.6 oz (68.3 kg), SpO2 99 %.  .  Intake/Output Summary (Last 24 hours) at 07/27/16 1430 Last data filed at 07/27/16 0705  Gross per 24 hour  Intake          1533.33 ml  Output                0 ml  Net          1533.33 ml    Exam VITAL SIGNS: Blood pressure 132/78, pulse 72, temperature 97.8 F (36.6 C), temperature source Oral, resp. rate 16, height '5\' 11"'$  (1.803 m), weight 150 lb 9.6 oz (68.3 kg), SpO2 99 %.  GENERAL:  56 y.o.-year-old patient lying in the bed with no acute distress.  EYES: Pupils equal, round, reactive to light and accommodation. No scleral icterus. Extraocular muscles intact.  HEENT: Head atraumatic, normocephalic. Oropharynx and nasopharynx clear.  NECK:  Supple, no jugular venous distention. No thyroid enlargement, no tenderness.  LUNGS: Normal breath sounds bilaterally, no wheezing, rales,rhonchi or crepitation. No use of accessory muscles of respiration.  CARDIOVASCULAR: S1, S2 normal. No murmurs, rubs, or gallops.  ABDOMEN: Soft, nontender, nondistended. Bowel sounds present. No organomegaly or mass.  EXTREMITIES: No pedal edema, cyanosis, or clubbing.  NEUROLOGIC: Cranial nerves II through XII are intact. Muscle strength 5/5 in  all extremities. Sensation intact. Gait not checked.  PSYCHIATRIC: The patient is alert and oriented x 3.  SKIN: No obvious rash, lesion, or ulcer.   Data Review     CBC w Diff: Lab Results  Component Value Date   WBC 3.6 (L) 07/26/2016   HGB 13.7 07/26/2016   HCT 40.2 07/26/2016   HCT 37.3 (L) 06/09/2015   PLT 236 07/26/2016   PLT 342 06/09/2015   LYMPHOPCT 11 07/26/2016    MONOPCT 2 07/26/2016   EOSPCT 0 07/26/2016   BASOPCT 0 07/26/2016   CMP: Lab Results  Component Value Date   NA 126 (L) 07/26/2016   NA 140 06/09/2015   K 4.5 07/26/2016   CL 96 (L) 07/26/2016   CO2 22 07/26/2016   BUN 8 07/26/2016   BUN 3 (L) 06/09/2015   CREATININE 0.76 07/26/2016   CREATININE 0.66 (L) 03/29/2015   PROT 6.6 07/26/2016   PROT 6.1 06/09/2015   ALBUMIN 3.4 (L) 07/26/2016   ALBUMIN 3.5 06/09/2015   BILITOT 0.5 07/26/2016   BILITOT <0.2 06/09/2015   ALKPHOS 72 07/26/2016   AST 26 07/26/2016   ALT 16 (L) 07/26/2016  .  Micro Results No results found for this or any previous visit (from the past 240 hour(s)).      Code Status Orders        Start     Ordered   07/26/16 1455  Full code  Continuous     07/26/16 1455    Code Status History    Date Active Date Inactive Code Status Order ID Comments User Context   03/30/2015 12:38 PM 03/30/2015  9:01 PM Full Code [id number]  Wellington Hampshire, MD Inpatient   08/25/2014  1:09 PM 08/27/2014 11:47 AM Full Code [id number]  Coolidge Breeze, PA-C Inpatient   08/24/2014  1:39 PM 08/24/2014  8:42 PM Full Code [id number]  Isaias Cowman, MD Inpatient          Follow-up Information    [name], MD Follow up in 1 week(s).   Specialty:  Family Medicine Contact information: [place] [address] [phone]        Lequita Asal, MD Follow up on 07/30/2016.   Specialty:  Hematology and Oncology Why:  as scheduled  Contact information: [place] [address] [phone]           Discharge Medications   Allergies as of 07/27/2016      Reactions   Amitriptyline Other (See Comments)   Burning sensation all over   Contrast Media [iodinated Diagnostic Agents] Other (See Comments)   Pt states that he got "knots behind his ears".   Nsaids Other (See Comments)   Other reaction(s): Other (See Comments) Reaction:  Blood in stool  Reaction:  Blood in  stool    Ibuprofen Other (See Comments), Nausea Only   Blood in stools      Medication List    STOP taking these medications   fluticasone 50 MCG/ACT nasal spray Commonly known as:  FLONASE   predniSONE 50 MG tablet Commonly known as:  DELTASONE     TAKE these medications   atorvastatin 40 MG tablet Commonly known as:  LIPITOR Take 1 tablet (40 mg total) by mouth daily.   chlorpheniramine-HYDROcodone 10-8 MG/5ML Suer Commonly known as:  TUSSIONEX PENNKINETIC ER Take 5 mLs by mouth every 12 (twelve) hours as needed for cough.   clopidogrel 75 MG tablet Commonly known as:  PLAVIX Take 75 mg by mouth daily.   diphenhydrAMINE 25 mg capsule Commonly known as:  BENADRYL Take 25 mg by mouth 2 (two) times daily as needed for allergies.   HYDROcodone-acetaminophen 5-325 MG tablet Commonly known as:  NORCO/VICODIN Take 1 tablet by mouth every 6 (six) hours as needed for moderate pain.   lisinopril 5 MG tablet Commonly known as:  PRINIVIL,ZESTRIL TAKE 1 TABLET BY MOUTH DAILY   metoprolol tartrate 25 MG tablet Commonly known as:  LOPRESSOR Take 1 tablet (25 mg total) by mouth 2 (two) times daily.   omeprazole 20 MG capsule Commonly known as:  PRILOSEC Take 20 mg by mouth daily.          Total Time in preparing paper work, data evaluation and todays exam - 35 minutes  Dustin Flock M.D on 07/27/2016 at 2:30 PM  [facility]   Office  [phone]

## 2016-07-27 NOTE — Progress Notes (Signed)
Patient to attend chemotherapy class Tuesday at 9am prior to infusion. Patient and wife made aware at bedside. Madlyn Frankel, RN

## 2016-07-27 NOTE — Progress Notes (Signed)
Subjective: Patient unchanged.  No new neurological complaints.  Objective: Current vital signs: BP 132/78 (BP Location: Left Arm)   Pulse 72   Temp 97.8 F (36.6 C) (Oral)   Resp 16   Ht '5\' 11"'$  (1.803 m)   Wt 68.3 kg (150 lb 9.6 oz)   SpO2 99%   BMI 21.00 kg/m  Vital signs in last 24 hours: Temp:  [97.8 F (36.6 C)-98.4 F (36.9 C)] 97.8 F (36.6 C) (04/13 1101) Pulse Rate:  [72-98] 72 (04/13 1101) Resp:  [15-21] 16 (04/13 1101) BP: (110-143)/(61-83) 132/78 (04/13 1101) SpO2:  [92 %-99 %] 99 % (04/13 1101) Weight:  [68.3 kg (150 lb 9.6 oz)-68.9 kg (152 lb)] 68.3 kg (150 lb 9.6 oz) (04/12 1454)  Intake/Output from previous day: 04/12 0701 - 04/13 0700 In: 1533.3 [I.V.:1533.3] Out: -  Intake/Output this shift: No intake/output data recorded. Nutritional status: Diet NPO time specified  Neurologic Exam: Mental Status: Alert, oriented, thought content appropriate.  Speech fluent without evidence of aphasia.  Able to follow 3 step commands without difficulty. Cranial Nerves: II: Discs flat bilaterally; Visual fields grossly normal, pupils equal, round, reactive to light and accommodation III,IV, VI: ptosis not present, extra-ocular motions intact bilaterally V,VII: smile symmetric, facial light touch sensation normal bilaterally VIII: hearing normal bilaterally IX,X: gag reflex present XI: bilateral shoulder shrug XII: midline tongue extension Motor: Right :  Upper extremity   5/5                                      Left:     Upper extremity   5/5             Lower extremity   5/5                                                  Lower extremity   5/5 Tone and bulk:normal tone throughout; no atrophy noted Sensory: Pinprick and light touch intact throughout, bilaterally  Lab Results: Basic Metabolic Panel:  Recent Labs Lab 07/26/16 1225  NA 126*  K 4.5  CL 96*  CO2 22  GLUCOSE 155*  BUN 8  CREATININE 0.76  CALCIUM 9.0    Liver Function Tests:  Recent  Labs Lab 07/26/16 1225  AST 26  ALT 16*  ALKPHOS 72  BILITOT 0.5  PROT 6.6  ALBUMIN 3.4*   No results for input(s): LIPASE, AMYLASE in the last 168 hours. No results for input(s): AMMONIA in the last 168 hours.  CBC:  Recent Labs Lab 07/26/16 1225  WBC 3.6*  NEUTROABS 3.1  HGB 13.7  HCT 40.2  MCV 88.8  PLT 236    Cardiac Enzymes:  Recent Labs Lab 07/26/16 1225  TROPONINI <0.03    Lipid Panel:  Recent Labs Lab 07/27/16 0602  CHOL 182  TRIG 115  HDL 47  CHOLHDL 3.9  VLDL 23  LDLCALC 112*    CBG: No results for input(s): GLUCAP in the last 168 hours.  Microbiology: Results for orders placed or performed during the hospital encounter of 06/28/16  Culture, blood (routine x 2)     Status: None   Collection Time: 06/28/16  9:57 AM  Result Value Ref Range Status   Specimen Description BLOOD L AC  Final  Special Requests BOTTLES DRAWN AEROBIC AND ANAEROBIC [place]  Final   Culture NO GROWTH 5 DAYS  Final   Report Status 07/03/2016 FINAL  Final  Culture, blood (routine x 2)     Status: None   Collection Time: 06/28/16  9:57 AM  Result Value Ref Range Status   Specimen Description BLOOD L AC  Final   Special Requests BOTTLES DRAWN AEROBIC AND ANAEROBIC [place]  Final   Culture NO GROWTH 5 DAYS  Final   Report Status 07/03/2016 FINAL  Final    Coagulation Studies:  Recent Labs  07/26/16 1225  LABPROT 12.7  INR 0.95    Imaging: Dg Chest 2 View  Result Date: 07/26/2016 CLINICAL DATA:  Shortness of breath, cough and chest pain. Stage IV lung cancer. EXAM: CHEST  2 VIEW COMPARISON:  06/28/2016 FINDINGS: Heart size is normal. Previous median sternotomy and CABG. Right lung is clear and right chest is normal. There is slight decrease in left hilar mass and superior mediastinal adenopathy by radiography. Minimal left base atelectasis and small left effusion. No other new finding. IMPRESSION: Small left effusion with mild left base atelectasis newly seen. Slight  radiographic reduction of left hilar mass and superior mediastinal adenopathy. Electronically Signed   By: Nelson Chimes M.D.   On: 07/26/2016 09:52   Mr Jeri Cos RD Contrast  Result Date: 07/26/2016 CLINICAL DATA:  Recent diagnosis of lung cancer. Left-sided facial burning when laying down. EXAM: MRI HEAD WITHOUT AND WITH CONTRAST TECHNIQUE: Multiplanar, multiecho pulse sequences of the brain and surrounding structures were obtained without and with intravenous contrast. CONTRAST:  23m MULTIHANCE GADOBENATE DIMEGLUMINE 529 MG/ML IV SOLN COMPARISON:  None. FINDINGS: Brain: No enhancement or swelling to suggest metastatic disease. There is a punctate DWI hyperintensity along high right frontal gyrus, presumably an incidental infarct. No prior infarct or notable microvascular ischemic change. No hemorrhage or hydrocephalus. Vascular: Preserved flow voids Skull and upper cervical spine: 1 cm marrow lesion in the upper occipital bone. Patient has known osseous metastatic disease. No evidence of intracranial invasion. Sinuses/Orbits: Negative.  No mass. No fat effacement or abnormal enhancement along the course of the left trigeminal nerve. These results will be called to the ordering clinician or representative by the Radiologist Assistant, and communication documented in the PACS or zVision Dashboard. IMPRESSION: 1. No evidence of brain metastasis. No explanation for left face symptoms. 2. Punctate DWI hyperintensity along a high right frontal gyrus, possible recent infarct. 3. Small occipital bone metastasis. Electronically Signed   By: JMonte FantasiaM.D.   On: 07/26/2016 09:40   [place] [facility] (at [facility])  Result Date: 07/27/2016 CLINICAL DATA:  Stroke EXAM: BILATERAL CAROTID DUPLEX ULTRASOUND TECHNIQUE: GPearline Cablesscale imaging, color Doppler and duplex ultrasound were performed of bilateral carotid and vertebral arteries in the neck. COMPARISON:  None. FINDINGS: Criteria: Quantification of  carotid stenosis is based on velocity parameters that correlate the residual internal carotid diameter with NASCET-based stenosis levels, using the diameter of the distal internal carotid lumen as the denominator for stenosis measurement. The following velocity measurements were obtained: RIGHT ICA:  101 cm/sec CCA:  87 cm/sec SYSTOLIC ICA/CCA RATIO:  1.2 DIASTOLIC ICA/CCA RATIO:  1.8 ECA:  105 cm/sec LEFT ICA:  93 cm/sec CCA:  96 cm/sec SYSTOLIC ICA/CCA RATIO:  1.0 DIASTOLIC ICA/CCA RATIO:  1.5 ECA:  124 cm/sec RIGHT CAROTID ARTERY: Mild calcified plaque in the bulb. Low resistance internal carotid Doppler pattern. RIGHT VERTEBRAL ARTERY:  Antegrade. LEFT CAROTID ARTERY:  Mild calcified plaque in the bulb. Low resistance internal carotid Doppler pattern. LEFT VERTEBRAL ARTERY:  Antegrade. IMPRESSION: Less than 50% stenosis in the right and left internal carotid arteries. Electronically Signed   By: Marybelle Killings M.D.   On: 07/27/2016 10:39   Mr Jodene Nam Head/brain NI Cm  Result Date: 07/27/2016 CLINICAL DATA:  Hypertension. Recent demonstration of small focus of restricted diffusion in knee right posterior frontal vertex. EXAM: MRA HEAD WITHOUT CONTRAST TECHNIQUE: Angiographic images of the Circle of Willis were obtained using MRA technique without intravenous contrast. COMPARISON:  07/26/2016 FINDINGS: Both internal carotid arteries are widely patent into the brain. Mild irregularity in the siphon regions but no stenosis. Anterior and middle cerebral vessels are patent without proximal stenosis, aneurysm or vascular malformation. Both vertebral arteries are widely patent to the basilar. No basilar stenosis. Posterior circulation branch vessels appear normal. IMPRESSION: Normal intracranial MR angiography of the large and medium size vessels. I think the finding on parenchymal imaging yesterday could be a small infarction, but there is considerable concern for the potential of a nonenhancing metastasis. Electronically  Signed   By: Nelson Chimes M.D.   On: 07/27/2016 10:50    Medications:  I have reviewed the patient's current medications. Scheduled: . atorvastatin  40 mg Oral Daily  . ceFAZolin  1 g Intravenous On Call to OR  . clopidogrel  75 mg Oral Daily  . enoxaparin (LOVENOX) injection  40 mg Subcutaneous Q24H  . gentamicin irrigation   Irrigation On Call to OR  . lisinopril  5 mg Oral Daily  . metoprolol tartrate  25 mg Oral BID  . nicotine  14 mg Transdermal Daily  . pantoprazole  40 mg Oral Daily    Assessment/Plan: Patient unchanged.  MRA performed and shows no significant arterial compromise.  Review of MRI by radiology brings up the possibility of metastasis.  LDL 112.  Carotid dopplers show no evidence of hemodynamically significant stenosis.  Echocardiogram is pending.    Recommendations: 1.  Statin for lipid management with target LDL<70. 2.  Patient may continue Plavix at discharge 3.  Have spoken with oncology, LP not indicated at this time 4.  Would repeat MRI on an outpatient basis in 1-2 months to follow up frontal lesion    LOS: 1 day   Alexis Goodell, MD Neurology [phone] 07/27/2016  11:48 AM

## 2016-07-27 NOTE — Progress Notes (Signed)
Discharge instructions given and went over with patient and wife at bedside. All questions answered. Follow-up appointments reviewed. Patient discharged home with wife and family via wheelchair by nursing staff. Madlyn Frankel, RN

## 2016-07-27 NOTE — Progress Notes (Signed)
Physical Therapy Evaluation Patient Details Name: Philip [name] MRN: [id number] DOB: [birth year] Today's Date: 07/27/2016   History of Present Illness  Philip [name]  is a 56 y.o. male with a known history of Hypertension, hyperlipidemia, DVT, CAD, PAD and lung cancer. The patient was sent by oncologist due to acute stroke per MRI of the brain. Patient has history of lung cancer. He was scheduled to start outpatient chemotherapy next Monday. He feels left head sensation changes and that I MRI of the brain, which show acute CVA. Neurologist suggested admitting patient and workup.  Clinical Impression  No deficits identified at time of PT evaluation. UE/LE strength and sensation intact. Pt is independent for all bed mobility, transfers, and ambulation. Good balance with some higher level deficits in single leg stance however pt is drowsy and was given anxiety meds prior to procedure this morning. No PT needs at this time. Will complete order. Please enter new order if status or needs change.     Follow Up Recommendations No PT follow up    Equipment Recommendations  None recommended by PT    Recommendations for Other Services       Precautions / Restrictions Precautions Precautions: Fall Restrictions Weight Bearing Restrictions: No      Mobility  Bed Mobility Overal bed mobility: Independent             General bed mobility comments: Good speed/sequencing  Transfers Overall transfer level: Independent Equipment used: None             General transfer comment: Good speed, sequencing, and stability. No deficits noted  Ambulation/Gait Ambulation/Gait assistance: Independent Ambulation Distance (Feet): 120 Feet Assistive device: None Gait Pattern/deviations: WFL(Within Functional Limits) Gait velocity: WFL Gait velocity interpretation: >2.62 ft/sec, indicative of independent community ambulator General Gait Details: Good speed, sequencing, and stability without  assistive device. Able to perform horizontal and vertical head turns without lateral gait deviation. Pt reports feeling drowsy from meds this morning  Stairs            Wheelchair Mobility    Modified Rankin (Stroke Patients Only)       Balance Overall balance assessment: Needs assistance Sitting-balance support: No upper extremity supported Sitting balance-Leahy Scale: Normal     Standing balance support: No upper extremity supported Standing balance-Leahy Scale: Normal Standing balance comment: Good stability noted. Negative Rhomberg. Single leg balance 2-3 seconds but pt is drowsy from anxiety meds this AM prior to procedure                             Pertinent Vitals/Pain Pain Assessment: 0-10 Pain Score: 7  Pain Location: Upper back pain Pain Intervention(s): Monitored during session    Home Living Family/patient expects to be discharged to:: Private residence Living Arrangements: Spouse/significant other Available Help at Discharge: Family Type of Home: Mobile home Home Access: Stairs to enter Entrance Stairs-Rails: Can reach both Entrance Stairs-Number of Steps: 5 Home Layout: One level        Prior Function Level of Independence: Independent         Comments: Independent with ADLs/IADLs and ambulation without assistive device. Drives. No falls in the last 12 months     Hand Dominance   Dominant Hand: Right    Extremity/Trunk Assessment   Upper Extremity Assessment Upper Extremity Assessment: Overall WFL for tasks assessed    Lower Extremity Assessment Lower Extremity Assessment: Overall WFL for tasks assessed  Communication   Communication: No difficulties  Cognition Arousal/Alertness: Awake/alert Behavior During Therapy: WFL for tasks assessed/performed Overall Cognitive Status: Within Functional Limits for tasks assessed                                        General Comments      Exercises      Assessment/Plan    PT Assessment Patent does not need any further PT services  PT Problem List         PT Treatment Interventions      PT Goals (Current goals can be found in the Care Plan section)  Acute Rehab PT Goals PT Goal Formulation: All assessment and education complete, DC therapy    Frequency     Barriers to discharge        Co-evaluation               End of Session Equipment Utilized During Treatment: Gait belt Activity Tolerance: Patient tolerated treatment well Patient left: in bed;with call bell/phone within reach;with family/visitor present   PT Visit Diagnosis: Unsteadiness on feet (R26.81)    Time: 7681-1572 PT Time Calculation (min) (ACUTE ONLY): 13 min   Charges:   PT Evaluation $PT Eval Low Complexity: 1 Procedure     PT G Codes:   PT G-Codes **NOT FOR INPATIENT CLASS** Functional Assessment Tool Used: AM-PAC 6 Clicks Basic Mobility Functional Limitation: Mobility: Walking and moving around Mobility: Walking and Moving Around Current Status (I2035): 0 percent impaired, limited or restricted Mobility: Walking and Moving Around Goal Status (D9741): 0 percent impaired, limited or restricted Mobility: Walking and Moving Around Discharge Status (U3845): 0 percent impaired, limited or restricted    [name] PT, DPT    [name],[name] 07/27/2016, 11:53 AM

## 2016-07-27 NOTE — Progress Notes (Addendum)
OT Cancellation Note  Patient Details Name: Philip [name] MRN: [id number] DOB: 11-Oct-1960   Cancelled Treatment:    Reason Eval/Treat Not Completed: Patient at procedure or test/ unavailable (Pt. preparing for vascular surgery portacath placement.) Will continue to monitor, and intervene when appropriate.  Harrel Carina, MS , OTR/L 07/27/2016, 12:41 PM

## 2016-07-27 NOTE — Op Note (Addendum)
      [facility]       Operative Note  Date: 07/27/2016  Preoperative diagnosis:  1. Lung cancer  Postoperative diagnosis:  Same as above  Procedures: #1. Ultrasound guidance for vascular access to the right internal jugular vein. #2. Fluoroscopic guidance for placement of catheter. #3. Placement of CT compatible Port-A-Cath, right internal jugular vein.  Surgeon: Leotis Pain, MD.   Anesthesia: Local with moderate conscious sedation for approximately 15  minutes using 5 mg of Versed and 100 mcg of Fentanyl  Fluoroscopy time: less than 1 minute  Contrast used: 0  Estimated blood loss: minimal  Indication for the procedure:  The patient is a 56 y.o.male with lung cancer.  The patient needs a Port-A-Cath for durable venous access, chemotherapy, lab draws, and CT scans. We are asked to place this. Risks and benefits were discussed and informed consent was obtained.  Description of procedure: The patient was brought to the vascular and interventional radiology suite.  Moderate conscious sedation was administered throughout the procedure during a face to face encounter with the patient with my supervision of the RN administering medicines and monitoring the patient's vital signs, pulse oximetry, telemetry and mental status throughout from the start of the procedure until the patient was taken to the recovery room. The right neck chest and shoulder were sterilely prepped and draped, and a sterile surgical field was created. Ultrasound was used to help visualize a patent right internal jugular vein. This was then accessed under direct ultrasound guidance without difficulty with the Seldinger needle and a permanent image was recorded. A J-wire was placed. After skin nick and dilatation, the peel-away sheath was then placed over the wire. I then anesthetized an area under the clavicle approximately 1-2 fingerbreadths. A transverse incision was created and an inferior pocket was  created with electrocautery and blunt dissection. The port was then brought onto the field, placed into the pocket and secured to the chest wall with 2 Prolene sutures. The catheter was connected to the port and tunneled from the subclavicular incision to the access site. Fluoroscopic guidance was then used to cut the catheter to an appropriate length. The catheter was then placed through the peel-away sheath and the peel-away sheath was removed. The catheter tip was parked in excellent location under fluorocoscopic guidance in the cavoatrial junction. The pocket was then irrigated with antibiotic impregnated saline and the wound was closed with a running 3-0 Vicryl and a 4-0 Monocryl. The access incision was closed with a single 4-0 Monocryl. The Huber needle was used to withdraw blood and flush the port with heparinized saline. Dermabond was then placed as a dressing. The patient tolerated the procedure well and was taken to the recovery room in stable condition.   Leotis Pain 07/27/2016 5:05 PM   This note was created with Dragon Medical transcription system. Any errors in dictation are purely unintentional.

## 2016-07-27 NOTE — H&P (Signed)
[facility] History & Physical Update  The patient was interviewed and re-examined.  The patient's previous History and Physical has been reviewed and is unchanged.  There is no change in the plan of care. We plan to proceed with the scheduled procedure.  Leotis Pain, MD  07/27/2016, 11:32 AM

## 2016-07-27 NOTE — Discharge Instructions (Signed)
[facility] [place] [facility] ° °DIET:  °Cardiac diet ° °DISCHARGE CONDITION:  °Stable ° °ACTIVITY:  °Activity as tolerated ° °OXYGEN:  °Home Oxygen: No. °  °Oxygen Delivery: room air ° °DISCHARGE LOCATION:  °home  ° ° °ADDITIONAL DISCHARGE INSTRUCTION: ° ° °If you experience worsening of your admission symptoms, develop shortness of breath, life threatening emergency, suicidal or homicidal thoughts you must seek medical attention immediately by calling 911 or calling your MD immediately  if symptoms less severe. ° °You Must read complete instructions/literature along with all the possible adverse reactions/side effects for all the Medicines you take and that have been prescribed to you. Take any new Medicines after you have completely understood and accpet all the possible adverse reactions/side effects.  ° °Please note ° °You were cared for by a hospitalist during your hospital stay. If you have any questions about your discharge medications or the care you received while you were in the hospital after you are discharged, you can call the unit and asked to speak with the hospitalist on call if the hospitalist that took care of you is not available. Once you are discharged, your primary care physician will handle any further medical issues. Please note that NO REFILLS for any discharge medications will be authorized once you are discharged, as it is imperative that you return to your primary care physician (or establish a relationship with a primary care physician if you do not have one) for your aftercare needs so that they can reassess your need for medications and monitor your lab values. ° ° °

## 2016-07-27 NOTE — Progress Notes (Signed)
*  PRELIMINARY RESULTS* Echocardiogram 2D Echocardiogram has been performed.  Philip [name] 07/27/2016, 9:22 AM

## 2016-07-27 NOTE — Progress Notes (Signed)
Patient here today for porta cath insertion per Dr Lucky Cowboy. Placed on monitor to monitor vitals as well as etco2 during and throughout entire procedure.

## 2016-07-27 NOTE — Progress Notes (Signed)
Initial Nutrition Assessment  DOCUMENTATION CODES:   Severe malnutrition in context of acute illness/injury  INTERVENTION:  Recommend Carnation Instant Breakfast in whole milk TID, each serving provides 280 kcal and 13 grams of protein.  Encouraged adequate intake of calories and protein at meals and snacks and through beverages. Patient not amenable to full education, but he would benefit from this.  NUTRITION DIAGNOSIS:   Malnutrition (Severe) related to acute illness (metastatic small cell lung cancer) as evidenced by energy intake < or equal to 50% for > or equal to 5 days, 9.1 percent weight loss over 2 months.  GOAL:   Patient will meet greater than or equal to 90% of their needs  MONITOR:   PO intake, Supplement acceptance, Labs, Weight trends, Diet advancement, I & O's  REASON FOR ASSESSMENT:   Malnutrition Screening Tool    ASSESSMENT:   56 year old male with PMHx of CAD s/p CABG x 3 2016, PAD s/p left toe amputation 2017, HTN, recurrent left lower extremity DVT, newly diagnosed metastatic small cell carcinoma of lung (biopsy 4/5) sent by oncologist due to acute CVA per MRI brain.    -Patient currently NPO for port placement. Per chart chemotherapy to begin week of 4/16.   Attempted to speak with patient and significant other at bedside. Patient very frustrated at time of assessment because he reports he has been waiting a long time for his port placement and he is anxious to leave the hospital. Noted discharge summary put in for patient to leave after port placement. He provides limited answers to questions. He reports he has had a poor appetite for a while now - long before he was diagnosed with cancer. 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 He reports eating only about 1 meal per day now and it is only bites lately. Before he used to eat 2-3 meals daily. He reports some occasional nausea and abdominal pain PTA. Denies any  difficulty chewing or swallowing. Significant other reports they have tried Ensure and Boost before but they cannot afford it. Patient enjoys milk and he is amenable to trying [employer] in milk.   UBW was 195 lbs in 2016. Per report he has lost 45 lbs (23% body weight) over the past 2 years. Of that weight loss, he has lost 15 lbs (9.1% body weight) over the past 2 months, which is significant for time frame.  Medications reviewed and include: famotidine, methylprednisolone, pantoprazole, NS @ 100 ml/hr.  Labs reviewed: Sodium 126, Chloride 96.   Patient refusing Nutrition-Focused Physical Exam. From observation during assessment he appears to having at least moderate to severe wasting of fat and muscle, but would need to be confirmed in full exam.  As patient refusing NFPE he only meets criteria for severe acute malnutrition from current information provided. Malnutrition is likely more of a chronic nature.   Diet Order:  Diet NPO time specified  Skin:  Reviewed, no issues  Last BM:  07/26/2016  Height:   Ht Readings from Last 1 Encounters:  07/26/16 '5\' 11"'$  (1.803 m)    Weight:   Wt Readings from Last 1 Encounters:  07/26/16 150 lb 9.6 oz (68.3 kg)    Ideal Body Weight:  78.2 kg  BMI:  Body mass index is 21 kg/m.  Estimated Nutritional Needs:   Kcal:  2000-2300 (MSJ x 1.3-1.5)  Protein:  80-100 grams (1.2-1.5 grams/kg)  Fluid:  2-2.3 L/day (  30-35 ml/kg)  EDUCATION NEEDS:   Education needs no appropriate at this time (Patient not amenable - too frustrated)  Willey Blade, MS, RD, LDN Pager: [phone] After Hours Pager: [phone]

## 2016-07-28 LAB — HEMOGLOBIN A1C
HEMOGLOBIN A1C: 5.1 % (ref 4.8–5.6)
MEAN PLASMA GLUCOSE: 100 mg/dL

## 2016-07-30 ENCOUNTER — Other Ambulatory Visit: Payer: Self-pay | Admitting: *Deleted

## 2016-07-30 ENCOUNTER — Encounter: Payer: Self-pay | Admitting: Vascular Surgery

## 2016-07-30 DIAGNOSIS — C3490 Malignant neoplasm of unspecified part of unspecified bronchus or lung: Secondary | ICD-10-CM

## 2016-07-31 ENCOUNTER — Inpatient Hospital Stay: Payer: Medicaid Other

## 2016-07-31 ENCOUNTER — Other Ambulatory Visit: Payer: Self-pay | Admitting: Hematology and Oncology

## 2016-07-31 ENCOUNTER — Inpatient Hospital Stay (HOSPITAL_BASED_OUTPATIENT_CLINIC_OR_DEPARTMENT_OTHER): Payer: Medicaid Other | Admitting: Hematology and Oncology

## 2016-07-31 ENCOUNTER — Ambulatory Visit: Payer: Self-pay | Admitting: Hematology and Oncology

## 2016-07-31 ENCOUNTER — Encounter: Payer: Self-pay | Admitting: Hematology and Oncology

## 2016-07-31 ENCOUNTER — Other Ambulatory Visit: Payer: Self-pay

## 2016-07-31 VITALS — BP 133/83 | HR 98 | Temp 95.2°F | Resp 18 | Wt 156.0 lb

## 2016-07-31 DIAGNOSIS — C7951 Secondary malignant neoplasm of bone: Secondary | ICD-10-CM | POA: Diagnosis not present

## 2016-07-31 DIAGNOSIS — Z7689 Persons encountering health services in other specified circumstances: Secondary | ICD-10-CM

## 2016-07-31 DIAGNOSIS — Z8701 Personal history of pneumonia (recurrent): Secondary | ICD-10-CM | POA: Insufficient documentation

## 2016-07-31 DIAGNOSIS — R590 Localized enlarged lymph nodes: Secondary | ICD-10-CM | POA: Insufficient documentation

## 2016-07-31 DIAGNOSIS — Z89422 Acquired absence of other left toe(s): Secondary | ICD-10-CM | POA: Insufficient documentation

## 2016-07-31 DIAGNOSIS — C3490 Malignant neoplasm of unspecified part of unspecified bronchus or lung: Secondary | ICD-10-CM

## 2016-07-31 DIAGNOSIS — E785 Hyperlipidemia, unspecified: Secondary | ICD-10-CM | POA: Diagnosis not present

## 2016-07-31 DIAGNOSIS — I739 Peripheral vascular disease, unspecified: Secondary | ICD-10-CM

## 2016-07-31 DIAGNOSIS — Z803 Family history of malignant neoplasm of breast: Secondary | ICD-10-CM | POA: Diagnosis not present

## 2016-07-31 DIAGNOSIS — C3412 Malignant neoplasm of upper lobe, left bronchus or lung: Secondary | ICD-10-CM | POA: Diagnosis not present

## 2016-07-31 DIAGNOSIS — Z79899 Other long term (current) drug therapy: Secondary | ICD-10-CM | POA: Diagnosis not present

## 2016-07-31 DIAGNOSIS — I252 Old myocardial infarction: Secondary | ICD-10-CM | POA: Insufficient documentation

## 2016-07-31 DIAGNOSIS — Z5111 Encounter for antineoplastic chemotherapy: Secondary | ICD-10-CM | POA: Diagnosis present

## 2016-07-31 DIAGNOSIS — G893 Neoplasm related pain (acute) (chronic): Secondary | ICD-10-CM | POA: Insufficient documentation

## 2016-07-31 DIAGNOSIS — C778 Secondary and unspecified malignant neoplasm of lymph nodes of multiple regions: Secondary | ICD-10-CM | POA: Insufficient documentation

## 2016-07-31 DIAGNOSIS — Q214 Aortopulmonary septal defect: Secondary | ICD-10-CM | POA: Diagnosis not present

## 2016-07-31 DIAGNOSIS — D72819 Decreased white blood cell count, unspecified: Secondary | ICD-10-CM | POA: Insufficient documentation

## 2016-07-31 DIAGNOSIS — Z86718 Personal history of other venous thrombosis and embolism: Secondary | ICD-10-CM | POA: Diagnosis not present

## 2016-07-31 DIAGNOSIS — E871 Hypo-osmolality and hyponatremia: Secondary | ICD-10-CM | POA: Diagnosis not present

## 2016-07-31 DIAGNOSIS — I2510 Atherosclerotic heart disease of native coronary artery without angina pectoris: Secondary | ICD-10-CM | POA: Diagnosis not present

## 2016-07-31 DIAGNOSIS — D649 Anemia, unspecified: Secondary | ICD-10-CM

## 2016-07-31 DIAGNOSIS — F17210 Nicotine dependence, cigarettes, uncomplicated: Secondary | ICD-10-CM | POA: Insufficient documentation

## 2016-07-31 DIAGNOSIS — R05 Cough: Secondary | ICD-10-CM | POA: Insufficient documentation

## 2016-07-31 DIAGNOSIS — Z801 Family history of malignant neoplasm of trachea, bronchus and lung: Secondary | ICD-10-CM | POA: Insufficient documentation

## 2016-07-31 DIAGNOSIS — I129 Hypertensive chronic kidney disease with stage 1 through stage 4 chronic kidney disease, or unspecified chronic kidney disease: Secondary | ICD-10-CM | POA: Diagnosis not present

## 2016-07-31 DIAGNOSIS — Z7189 Other specified counseling: Secondary | ICD-10-CM

## 2016-07-31 DIAGNOSIS — R059 Cough, unspecified: Secondary | ICD-10-CM | POA: Insufficient documentation

## 2016-07-31 DIAGNOSIS — M549 Dorsalgia, unspecified: Secondary | ICD-10-CM | POA: Diagnosis not present

## 2016-07-31 LAB — CBC WITH DIFFERENTIAL/PLATELET
Basophils Absolute: 0.1 10*3/uL (ref 0–0.1)
Basophils Relative: 2 %
Eosinophils Absolute: 0.1 10*3/uL (ref 0–0.7)
Eosinophils Relative: 2 %
HCT: 40 % (ref 40.0–52.0)
Hemoglobin: 13.9 g/dL (ref 13.0–18.0)
Lymphocytes Relative: 23 %
Lymphs Abs: 1.1 10*3/uL (ref 1.0–3.6)
MCH: 30.5 pg (ref 26.0–34.0)
MCHC: 34.6 g/dL (ref 32.0–36.0)
MCV: 88.1 fL (ref 80.0–100.0)
Monocytes Absolute: 0.4 10*3/uL (ref 0.2–1.0)
Monocytes Relative: 8 %
Neutro Abs: 3.3 10*3/uL (ref 1.4–6.5)
Neutrophils Relative %: 65 %
Platelets: 235 10*3/uL (ref 150–440)
RBC: 4.54 MIL/uL (ref 4.40–5.90)
RDW: 13.8 % (ref 11.5–14.5)
WBC: 5 10*3/uL (ref 3.8–10.6)

## 2016-07-31 LAB — COMPREHENSIVE METABOLIC PANEL
ALT: 11 U/L — ABNORMAL LOW (ref 17–63)
AST: 16 U/L (ref 15–41)
Albumin: 3.2 g/dL — ABNORMAL LOW (ref 3.5–5.0)
Alkaline Phosphatase: 73 U/L (ref 38–126)
Anion gap: 5 (ref 5–15)
BUN: 9 mg/dL (ref 6–20)
CO2: 25 mmol/L (ref 22–32)
Calcium: 8.5 mg/dL — ABNORMAL LOW (ref 8.9–10.3)
Chloride: 100 mmol/L — ABNORMAL LOW (ref 101–111)
Creatinine, Ser: 0.58 mg/dL — ABNORMAL LOW (ref 0.61–1.24)
GFR calc Af Amer: >60 mL/min (ref >60)
GFR calc non Af Amer: >60 mL/min (ref >60)
Glucose, Bld: 92 mg/dL (ref 65–99)
Potassium: 4.2 mmol/L (ref 3.5–5.1)
Sodium: 130 mmol/L — ABNORMAL LOW (ref 135–145)
Total Bilirubin: 0.6 mg/dL (ref 0.3–1.2)
Total Protein: 6.4 g/dL — ABNORMAL LOW (ref 6.5–8.1)

## 2016-07-31 LAB — MAGNESIUM: Magnesium: 1.9 mg/dL (ref 1.7–2.4)

## 2016-07-31 MED ORDER — SODIUM CHLORIDE 0.9 % IV SOLN
630.0000 mg | Freq: Once | INTRAVENOUS | Status: AC
  Administered 2016-07-31: 630 mg via INTRAVENOUS
  Filled 2016-07-31: qty 63

## 2016-07-31 MED ORDER — SODIUM CHLORIDE 0.9 % IV SOLN
Freq: Once | INTRAVENOUS | Status: AC
  Administered 2016-07-31: 13:00:00 via INTRAVENOUS
  Filled 2016-07-31: qty 1000

## 2016-07-31 MED ORDER — SODIUM CHLORIDE 0.9 % IV SOLN
100.0000 mg/m2 | Freq: Once | INTRAVENOUS | Status: AC
  Administered 2016-07-31: 190 mg via INTRAVENOUS
  Filled 2016-07-31: qty 9.5

## 2016-07-31 MED ORDER — ONDANSETRON HCL 8 MG PO TABS: 8.0000 mg | ORAL_TABLET | Freq: Two times a day (BID) | ORAL | 1 refills | Status: DC | PRN

## 2016-07-31 MED ORDER — LIDOCAINE-PRILOCAINE 2.5-2.5 % EX CREA: TOPICAL_CREAM | CUTANEOUS | 3 refills | Status: DC

## 2016-07-31 MED ORDER — SODIUM CHLORIDE 0.9 % IV SOLN: 10.0000 mg | Freq: Once | INTRAVENOUS | Status: DC

## 2016-07-31 MED ORDER — HEPARIN SOD (PORK) LOCK FLUSH 100 UNIT/ML IV SOLN
INTRAVENOUS | Status: AC
  Filled 2016-07-31: qty 5

## 2016-07-31 MED ORDER — DEXAMETHASONE SODIUM PHOSPHATE 10 MG/ML IJ SOLN
10.0000 mg | Freq: Once | INTRAMUSCULAR | Status: AC
  Administered 2016-07-31: 10 mg via INTRAVENOUS
  Filled 2016-07-31: qty 1

## 2016-07-31 MED ORDER — PALONOSETRON HCL INJECTION 0.25 MG/5ML
0.2500 mg | Freq: Once | INTRAVENOUS | Status: AC
  Administered 2016-07-31: 0.25 mg via INTRAVENOUS
  Filled 2016-07-31: qty 5

## 2016-07-31 MED ORDER — LORAZEPAM 0.5 MG PO TABS: 0.5000 mg | ORAL_TABLET | Freq: Four times a day (QID) | ORAL | 0 refills | Status: DC | PRN

## 2016-07-31 NOTE — Progress Notes (Signed)
Patient continues to cough.  States today he has noticed streaks of blood in his sputum.  Pain 6/10 in his back.

## 2016-07-31 NOTE — Progress Notes (Addendum)
[facility] day:  07/31/2016   Chief Complaint: Philip [name] is a 56 y.o. male with extensive small cell lung cancer who is seen for assessment prior to cycle #1 carboplatin and etoposide.  HPI:  The patient was last seen in the medical oncology clinic on 07/25/2016.  At that time, supraclavicular biopsy was reviewed.  We discussed the plan for palliative chemotherapy.  He had a cough which was affecting his eating and sleep.  Pain medications and cough suppressant was prescribed.  Head MRI, port placement, and chemotherapy class were scheduled.  CXR on 07/26/2016 revealed small left effusion with mild left base atelectasis.  There was slight radiographic reduction of left hilar mass and superior mediastinal adenopathy.  He underwent head MRI on 07/26/2016.  He noted left face symptoms.  Head MRI revealed no evidence of brain metastasis.  There was no explanation for left face symptoms.  There was punctate DWI hyperintensity along a high right frontal gyrus, possible recent infarct.  There was a small occipital bone metastasis.  He was admitted to [facility] from 07/26/2016 - 07/27/2016.  He underwent a work-up for a CVA.  He was seen by Dr. Alexis Goodell, neurologist.  Head MRA on 07/26/2016 revealed normal angiography of the large and medium sized vessels.  Bilateral carotid ultrasound showed < 50% stenosis in the right and left internal carotid arteries. Echo on 07/27/2016 showed an EF of 45-50% with no evidence of thrombus. Prior to discharge, he underwent port-a-cath placement by Dr. Lucky Cowboy.    Symptomatically, he continues to cough.  He has noted streaks of blood in his sputum. He denies any fevers. He has back pain (6 out of 10).     Past Medical History:  Diagnosis Date  . Anemia   . CAD (coronary artery disease)    a. 08/2014 Inf STEMI/CABG x 3 (LIMA->LAD, VG->Diag, RIMA->RCA).  . DVT, recurrent, lower extremity, acute, left ([place]) 2016  .  Hyperlipidemia   . Hypertension   . Hypertensive heart disease   . Lung cancer ([place])   . PAD (peripheral artery disease) ([place])    a. 03/2015 Periph Angio: short occlusion of L pop w/ evidence of embolization into the DP->5.0x50 mm Inova self-expanding stent;  b. 04/08/2015 ABI: R 1.12, L 0.99.  . Tobacco abuse   . Toe amputation status, left [facility]) 07/09/2016   2017    Past Surgical History:  Procedure Laterality Date  . CARDIAC CATHETERIZATION N/A 08/24/2014   Procedure: Left Heart Cath and Coronary Angiography;  Surgeon: Isaias Cowman, MD;  Location: [place] [facility];  Service: Cardiovascular;  Laterality: N/A;  . CARDIAC CATHETERIZATION N/A 08/24/2014   Procedure: Coronary Stent Intervention;  Surgeon: Isaias Cowman, MD;  Location: [place] [facility];  Service: Cardiovascular;  Laterality: N/A;  . CORONARY ARTERY BYPASS GRAFT N/A 08/25/2014   Procedure: CORONARY ARTERY BYPASS GRAFTING (CABG), ON PUMP, TIMES THREE, USING BILATERAL MAMMARY ARTERIES, RIGHT GREATER SAPHENOUS VEIN HARVESTED ENDOSCOPICALLY;  Surgeon: Melrose Nakayama, MD;  Location: [place];  Service: Open Heart Surgery;  Laterality: N/A;  . PERIPHERAL VASCULAR CATHETERIZATION N/A 03/30/2015   Procedure: Abdominal Aortogram w/Lower Extremity;  Surgeon: Wellington Hampshire, MD;  Location: [place] [facility];  Service: Cardiovascular;  Laterality: N/A;  . PORTA CATH INSERTION N/A 07/27/2016   Procedure: Glori Luis Cath Insertion;  Surgeon: Algernon Huxley, MD;  Location: [place] [facility];  Service: Cardiovascular;  Laterality: N/A;  . TEE WITHOUT CARDIOVERSION N/A  08/25/2014   Procedure: TRANSESOPHAGEAL ECHOCARDIOGRAM (TEE);  Surgeon: Melrose Nakayama, MD;  Location: [place];  Service: Open Heart Surgery;  Laterality: N/A;    Family History  Problem Relation Age of Onset  . Hypertension Father   . Lung cancer Father   . Heart attack Mother 56    STENT  . Hyperlipidemia Mother   . Heart attack Brother   . Heart  attack Brother   . Breast cancer Sister   . Heart attack Sister   . Colon cancer Paternal Grandfather     Social History:  reports that he has been smoking Cigarettes.  He has been smoking about 0.50 packs per day. He has never used smokeless tobacco. He reports that he drinks alcohol. He reports that he does not use drugs.  The patient smokes 1/2 pack of cigarettes/day (previously 2 1/2 ppd).  He started smoking in his teens.  He denies any exposure to radiation or toxins.  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  He lives in [place].  The patient is accompanied by his wife, Mariann Laster, today.  Allergies:  Allergies  Allergen Reactions  . Amitriptyline Other (See Comments)    Burning sensation all over  . Contrast Media [Iodinated Diagnostic Agents] Other (See Comments)    Pt states that he got "knots behind his ears".  . Nsaids Other (See Comments)    Other reaction(s): Other (See Comments) Reaction:  Blood in stool  Reaction:  Blood in stool   . Ibuprofen Other (See Comments) and Nausea Only    Blood in stools    Current Medications: Current Outpatient Prescriptions  Medication Sig Dispense Refill  . atorvastatin (LIPITOR) 40 MG tablet Take 1 tablet (40 mg total) by mouth daily. 90 tablet 3  . chlorpheniramine-HYDROcodone (TUSSIONEX PENNKINETIC ER) 10-8 MG/5ML SUER Take 5 mLs by mouth every 12 (twelve) hours as needed for cough. 140 mL 0  . clopidogrel (PLAVIX) 75 MG tablet Take 75 mg by mouth daily.    . diphenhydrAMINE (BENADRYL) 25 mg capsule Take 25 mg by mouth 2 (two) times daily as needed for allergies.     Marland Kitchen HYDROcodone-acetaminophen (NORCO/VICODIN) 5-325 MG tablet Take 1 tablet by mouth every 6 (six) hours as needed for moderate pain. 40 tablet 0  . lisinopril (PRINIVIL,ZESTRIL) 5 MG tablet TAKE 1 TABLET BY MOUTH DAILY 90 tablet 1  . LORazepam (ATIVAN) 0.5 MG tablet Take 1 tablet (0.5 mg total) by mouth every 6 (six) hours as needed (Nausea or vomiting). 30  tablet 0  . metoprolol tartrate (LOPRESSOR) 25 MG tablet Take 1 tablet (25 mg total) by mouth 2 (two) times daily. 180 tablet 3  . omeprazole (PRILOSEC) 20 MG capsule Take 20 mg by mouth daily.    Marland Kitchen lidocaine-prilocaine (EMLA) cream Apply to affected area once 30 g 3  . ondansetron (ZOFRAN) 8 MG tablet Take 1 tablet (8 mg total) by mouth 2 (two) times daily as needed for refractory nausea / vomiting. Start on day 3 after carboplatin chemo. 30 tablet 1   No current facility-administered medications for this visit.     Review of Systems:  GENERAL:  Fatigue.  Weight loss of 36 pounds (previously 190 pounds).  Weight up 4 pounds. PERFORMANCE STATUS (ECOG):  1 HEENT:  No visual changes, mouth sores or tenderness. Lungs: No shortness of breath.  Bouts of coughing.  Sputum with streaks of blood.. Cardiac: No chest pain, palpitations, orthopnea, or PND. GI:  Appetite  affected by coughing.  No nausea, vomiting,diarrhea, constipation, melena or hematochezia.  Colonoscopy 30 years ago.   GU:  No urgency, frequency, dysuria, or hematuria. Musculoskeletal:  Back pain between shoulder blades.  No joint pain.  No muscle tenderness. Extremities:  Leg cramps.  No pain or swelling. Left toe removed (old). Skin:  No rashes or skin changes. Neuro:  Left facial symptoms (mild).  No headache, numbness or weakness, balance or coordination issues. Endocrine:  No diabetes, thyroid issues, hot flashes or night sweats. Psych:  No mood changes, depression or anxiety. Pain:  No focal pain. Review of systems:  All other systems reviewed and found to be negative.  Physical Exam: Blood pressure 133/83, pulse 98, temperature (!) 95.2 F (35.1 C), temperature source Tympanic, resp. rate 18, weight 156 lb (70.8 kg). GENERAL:  Well developed, well nourished, sitting comfortably in the exam room in no acute distress. MENTAL STATUS:  Alert and oriented to person, place and time. HEAD:  Black hair with graying.   Normocephalic, atraumatic, face symmetric, no Cushingoid features. EYES:  Brown eyes.  Pupils equal round and reactive to light and accomodation.  No conjunctivitis or scleral icterus. ENT:  Oropharynx clear without lesion.  Tongue normal.  Poor dentition.  Mucous membranes moist.  RESPIRATORY:  Clear to auscultation without rales, wheezes or rhonchi. CARDIOVASCULAR:  Regular rate and rhythm without murmur, rub or gallop. ABDOMEN:  Soft, non-tender, with active bowel sounds, and no hepatosplenomegaly.  No masses. SKIN:  No rashes, ulcers or lesions. EXTREMITIES: No lower extremity edema, no skin discoloration or tenderness.  No palpable cords. LYMPH NODES: No palpable cervical, supraclavicular, axillary or inguinal adenopathy  NEUROLOGICAL: Unremarkable. PSYCH:  Appropriate.   Appointment on 07/31/2016  Component Date Value Ref Range Status  . WBC 07/31/2016 5.0  3.8 - 10.6 K/uL Final  . RBC 07/31/2016 4.54  4.40 - 5.90 MIL/uL Final  . Hemoglobin 07/31/2016 13.9  13.0 - 18.0 g/dL Final  . HCT 07/31/2016 40.0  40.0 - 52.0 % Final  . MCV 07/31/2016 88.1  80.0 - 100.0 fL Final  . MCH 07/31/2016 30.5  26.0 - 34.0 pg Final  . MCHC 07/31/2016 34.6  32.0 - 36.0 g/dL Final  . RDW 07/31/2016 13.8  11.5 - 14.5 % Final  . Platelets 07/31/2016 235  150 - 440 K/uL Final  . Neutrophils Relative % 07/31/2016 65  % Final  . Neutro Abs 07/31/2016 3.3  1.4 - 6.5 K/uL Final  . Lymphocytes Relative 07/31/2016 23  % Final  . Lymphs Abs 07/31/2016 1.1  1.0 - 3.6 K/uL Final  . Monocytes Relative 07/31/2016 8  % Final  . Monocytes Absolute 07/31/2016 0.4  0.2 - 1.0 K/uL Final  . Eosinophils Relative 07/31/2016 2  % Final  . Eosinophils Absolute 07/31/2016 0.1  0 - 0.7 K/uL Final  . Basophils Relative 07/31/2016 2  % Final  . Basophils Absolute 07/31/2016 0.1  0 - 0.1 K/uL Final  . Sodium 07/31/2016 130* 135 - 145 mmol/L Final  . Potassium 07/31/2016 4.2  3.5 - 5.1 mmol/L Final  . Chloride 07/31/2016 100*  101 - 111 mmol/L Final  . CO2 07/31/2016 25  22 - 32 mmol/L Final  . Glucose, Bld 07/31/2016 92  65 - 99 mg/dL Final  . BUN 07/31/2016 9  6 - 20 mg/dL Final  . Creatinine, Ser 07/31/2016 0.58* 0.61 - 1.24 mg/dL Final  . Calcium 07/31/2016 8.5* 8.9 - 10.3 mg/dL Final  . Total Protein 07/31/2016 6.4*  6.5 - 8.1 g/dL Final  . Albumin 07/31/2016 3.2* 3.5 - 5.0 g/dL Final  . AST 07/31/2016 16  15 - 41 U/L Final  . ALT 07/31/2016 11* 17 - 63 U/L Final  . Alkaline Phosphatase 07/31/2016 73  38 - 126 U/L Final  . Total Bilirubin 07/31/2016 0.6  0.3 - 1.2 mg/dL Final  . GFR calc non Af Amer 07/31/2016 >60  >60 mL/min Final  . GFR calc Af Amer 07/31/2016 >60  >60 mL/min Final   Comment: (NOTE) The eGFR has been calculated using the CKD EPI equation. This calculation has not been validated in all clinical situations. eGFR's persistently <60 mL/min signify possible Chronic Kidney Disease.   . Anion gap 07/31/2016 5  5 - 15 Final  . Magnesium 07/31/2016 1.9  1.7 - 2.4 mg/dL Final    Assessment:  Philip [name] is a 56 y.o. male with extensive stage small cell lung cancer. He presented witha 2 year history of night sweats and a 36 pound weight loss in 6 months. Right supraclavicular biopsy on 07/19/2016 revealed small cell carcinoma of the lung.  CEA was 1.7 on 07/31/2016.  Chest CT on 06/28/2016 revealed extensive adenopathy.  There were multiple lymph nodes descending thoracic aorta, largest measuring 2.1 x 1.7 cm.  There was adenopathy in the aortopulmonary window with the largest confluence of lymph nodes measuring 4 x 2.6 cm. There was hilar adenopathy on the left measuring 2 x 1.9 cm. There were multiple enlarged lymph nodes in the pretracheal region, largest 1.7 x 1.7 cm.  There were lymph nodes to the left of the carina measuring 2.8 x 1.9 cm. There was a subcarinal lymph node measuring 2.4 x 1.6 cm. There was a 4 x 3 mm nodular opacity in the anterior segment of the left upper lobe.     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  There was nodal metastasis within the chest and supraclavicular/low jugular regions.  There was multifocal osseous metastasis.  There was possibly a pathologic fracture involving the posteromedial right tenth rib.  There was new left lower lobe opacity with mild hypermetabolism, suspicious for infection or postobstructive pneumonitis.  He completed a course of Levaquin for a post-obstructive pneumonia.  Head MRI on 07/26/2016 revealed no evidence of brain metastasis.  There was punctate DWI hyperintensity along a high right frontal gyrus, possible recent infarct.  There was a small occipital bone metastasis.  He was admitted to [facility] from 07/26/2016 - 07/27/2016.  He underwent a work-up for a CVA.  Head MRA on 07/26/2016 revealed normal angiography of the large and medium sized vessels.  Bilateral carotid ultrasound showed < 50% stenosis in the right and left internal carotid arteries. Echo on 07/27/2016 showed an EF of 45-50% with no evidence of thrombus.    He has a history of left lower extremity DVT a few months after his cardiac surgery in 08/2014.  He has peripheral vascular disease s/p stent placement in 03/2015.  He is on a Plavix.  He has not had a colonoscopy in 30 years.  Symptomatically, he has a chronic cough.  Sputum is clear with streaks of blood.  Exam is stable.  Plan: 1.  Labs today:  CBC with diff, CMP, Mg, CEA. 2.  Review hospitalization and work-up for a possible distant CVA. 3.  Review chemotherapy plan.  Side effects reviewed.  Treatment is palliative.  Patient consented to treatment. 4.  Day 1 of carboplatin and etoposide  today 5.  RTC on 04/18 and 04/19 for etoposide 6.  RTC on 04/20 for Neulasta 7.  RTC on 08/09/2016 for MD assessment, labs (CBC with diff, BMP, Mg), and Xgeva 8.  RTC on 08/21/2016 for MD assessment, labs (CBC with diff, CMP, Mg), and cycle #2 carboplatin  and etoposide.   Lequita Asal, MD  07/31/2016, 12:41 PM

## 2016-08-01 ENCOUNTER — Inpatient Hospital Stay: Payer: Medicaid Other

## 2016-08-01 ENCOUNTER — Other Ambulatory Visit: Payer: Self-pay | Admitting: Cardiovascular Disease

## 2016-08-01 ENCOUNTER — Telehealth: Payer: Self-pay | Admitting: *Deleted

## 2016-08-01 VITALS — BP 143/81 | HR 87 | Temp 98.3°F | Resp 20

## 2016-08-01 DIAGNOSIS — Z5111 Encounter for antineoplastic chemotherapy: Secondary | ICD-10-CM | POA: Diagnosis not present

## 2016-08-01 DIAGNOSIS — C3490 Malignant neoplasm of unspecified part of unspecified bronchus or lung: Secondary | ICD-10-CM

## 2016-08-01 LAB — CEA: CEA: 1.7 ng/mL (ref 0.0–4.7)

## 2016-08-01 MED ORDER — DEXAMETHASONE SODIUM PHOSPHATE 10 MG/ML IJ SOLN
10.0000 mg | Freq: Once | INTRAMUSCULAR | Status: AC
  Administered 2016-08-01: 10 mg via INTRAVENOUS
  Filled 2016-08-01: qty 1

## 2016-08-01 MED ORDER — SODIUM CHLORIDE 0.9 % IV SOLN
Freq: Once | INTRAVENOUS | Status: AC
  Administered 2016-08-01: 13:00:00 via INTRAVENOUS
  Filled 2016-08-01: qty 1000

## 2016-08-01 MED ORDER — HEPARIN SOD (PORK) LOCK FLUSH 100 UNIT/ML IV SOLN
500.0000 [IU] | Freq: Once | INTRAVENOUS | Status: AC | PRN
  Administered 2016-08-01: 500 [IU]
  Filled 2016-08-01 (×2): qty 5

## 2016-08-01 MED ORDER — SODIUM CHLORIDE 0.9 % IV SOLN
100.0000 mg/m2 | Freq: Once | INTRAVENOUS | Status: AC
  Administered 2016-08-01: 190 mg via INTRAVENOUS
  Filled 2016-08-01: qty 9.5

## 2016-08-01 NOTE — Telephone Encounter (Signed)
Pt requests refill of pain medication. Would like to pick it up tomorrow when comes for day 3 etoposide.

## 2016-08-01 NOTE — Telephone Encounter (Signed)
He got a 10 day supply on the 11th, so I will check with Dr. Mike Gip and see if she wants to adjust this, and we will print it in [place] tomorrow. Thanks,

## 2016-08-02 ENCOUNTER — Inpatient Hospital Stay: Payer: Medicaid Other

## 2016-08-02 ENCOUNTER — Other Ambulatory Visit: Payer: Self-pay | Admitting: *Deleted

## 2016-08-02 VITALS — BP 136/80 | HR 84 | Temp 98.1°F | Resp 20

## 2016-08-02 DIAGNOSIS — C7951 Secondary malignant neoplasm of bone: Secondary | ICD-10-CM

## 2016-08-02 DIAGNOSIS — C3490 Malignant neoplasm of unspecified part of unspecified bronchus or lung: Secondary | ICD-10-CM

## 2016-08-02 DIAGNOSIS — Z5111 Encounter for antineoplastic chemotherapy: Secondary | ICD-10-CM | POA: Diagnosis not present

## 2016-08-02 MED ORDER — HEPARIN SOD (PORK) LOCK FLUSH 100 UNIT/ML IV SOLN
INTRAVENOUS | Status: AC
  Filled 2016-08-02: qty 5

## 2016-08-02 MED ORDER — SODIUM CHLORIDE 0.9% FLUSH
10.0000 mL | INTRAVENOUS | Status: DC | PRN
  Filled 2016-08-02: qty 10

## 2016-08-02 MED ORDER — HEPARIN SOD (PORK) LOCK FLUSH 100 UNIT/ML IV SOLN
500.0000 [IU] | Freq: Once | INTRAVENOUS | Status: AC
  Administered 2016-08-02: 500 [IU] via INTRAVENOUS

## 2016-08-02 MED ORDER — OXYCODONE HCL 5 MG PO TABS: 5.0000 mg | ORAL_TABLET | ORAL | 0 refills | Status: DC | PRN

## 2016-08-02 MED ORDER — DEXAMETHASONE SODIUM PHOSPHATE 10 MG/ML IJ SOLN
10.0000 mg | Freq: Once | INTRAMUSCULAR | Status: AC
  Administered 2016-08-02: 10 mg via INTRAVENOUS
  Filled 2016-08-02: qty 1

## 2016-08-02 MED ORDER — SODIUM CHLORIDE 0.9 % IV SOLN
Freq: Once | INTRAVENOUS | Status: AC
  Administered 2016-08-02: 13:00:00 via INTRAVENOUS
  Filled 2016-08-02: qty 1000

## 2016-08-02 MED ORDER — PEGFILGRASTIM 6 MG/0.6ML ~~LOC~~ PSKT
6.0000 mg | PREFILLED_SYRINGE | Freq: Once | SUBCUTANEOUS | Status: AC
  Administered 2016-08-02: 6 mg via SUBCUTANEOUS
  Filled 2016-08-02: qty 0.6

## 2016-08-02 MED ORDER — SODIUM CHLORIDE 0.9 % IV SOLN
100.0000 mg/m2 | Freq: Once | INTRAVENOUS | Status: AC
  Administered 2016-08-02: 190 mg via INTRAVENOUS
  Filled 2016-08-02: qty 9.5

## 2016-08-03 ENCOUNTER — Inpatient Hospital Stay: Payer: Medicaid Other

## 2016-08-09 ENCOUNTER — Inpatient Hospital Stay (HOSPITAL_BASED_OUTPATIENT_CLINIC_OR_DEPARTMENT_OTHER): Payer: Medicaid Other | Admitting: Hematology and Oncology

## 2016-08-09 ENCOUNTER — Inpatient Hospital Stay: Payer: Medicaid Other

## 2016-08-09 ENCOUNTER — Encounter: Payer: Self-pay | Admitting: Hematology and Oncology

## 2016-08-09 ENCOUNTER — Telehealth: Payer: Self-pay | Admitting: *Deleted

## 2016-08-09 VITALS — BP 98/64 | HR 88 | Temp 96.8°F | Resp 18 | Wt 154.0 lb

## 2016-08-09 DIAGNOSIS — E871 Hypo-osmolality and hyponatremia: Secondary | ICD-10-CM

## 2016-08-09 DIAGNOSIS — I739 Peripheral vascular disease, unspecified: Secondary | ICD-10-CM

## 2016-08-09 DIAGNOSIS — Z7189 Other specified counseling: Secondary | ICD-10-CM

## 2016-08-09 DIAGNOSIS — C7951 Secondary malignant neoplasm of bone: Secondary | ICD-10-CM

## 2016-08-09 DIAGNOSIS — I2510 Atherosclerotic heart disease of native coronary artery without angina pectoris: Secondary | ICD-10-CM

## 2016-08-09 DIAGNOSIS — C3412 Malignant neoplasm of upper lobe, left bronchus or lung: Secondary | ICD-10-CM

## 2016-08-09 DIAGNOSIS — Z79899 Other long term (current) drug therapy: Secondary | ICD-10-CM

## 2016-08-09 DIAGNOSIS — Z8701 Personal history of pneumonia (recurrent): Secondary | ICD-10-CM

## 2016-08-09 DIAGNOSIS — M549 Dorsalgia, unspecified: Secondary | ICD-10-CM

## 2016-08-09 DIAGNOSIS — Z5111 Encounter for antineoplastic chemotherapy: Secondary | ICD-10-CM | POA: Diagnosis not present

## 2016-08-09 DIAGNOSIS — Z801 Family history of malignant neoplasm of trachea, bronchus and lung: Secondary | ICD-10-CM

## 2016-08-09 DIAGNOSIS — C3490 Malignant neoplasm of unspecified part of unspecified bronchus or lung: Secondary | ICD-10-CM

## 2016-08-09 DIAGNOSIS — R590 Localized enlarged lymph nodes: Secondary | ICD-10-CM

## 2016-08-09 DIAGNOSIS — C778 Secondary and unspecified malignant neoplasm of lymph nodes of multiple regions: Secondary | ICD-10-CM

## 2016-08-09 DIAGNOSIS — Z86718 Personal history of other venous thrombosis and embolism: Secondary | ICD-10-CM

## 2016-08-09 DIAGNOSIS — G893 Neoplasm related pain (acute) (chronic): Secondary | ICD-10-CM

## 2016-08-09 DIAGNOSIS — Z803 Family history of malignant neoplasm of breast: Secondary | ICD-10-CM

## 2016-08-09 DIAGNOSIS — D72819 Decreased white blood cell count, unspecified: Secondary | ICD-10-CM

## 2016-08-09 DIAGNOSIS — Z0000 Encounter for general adult medical examination without abnormal findings: Secondary | ICD-10-CM | POA: Insufficient documentation

## 2016-08-09 DIAGNOSIS — T451X5A Adverse effect of antineoplastic and immunosuppressive drugs, initial encounter: Secondary | ICD-10-CM

## 2016-08-09 DIAGNOSIS — Z7689 Persons encountering health services in other specified circumstances: Secondary | ICD-10-CM

## 2016-08-09 DIAGNOSIS — F17210 Nicotine dependence, cigarettes, uncomplicated: Secondary | ICD-10-CM

## 2016-08-09 DIAGNOSIS — Z89422 Acquired absence of other left toe(s): Secondary | ICD-10-CM

## 2016-08-09 DIAGNOSIS — I252 Old myocardial infarction: Secondary | ICD-10-CM

## 2016-08-09 DIAGNOSIS — E785 Hyperlipidemia, unspecified: Secondary | ICD-10-CM

## 2016-08-09 DIAGNOSIS — D649 Anemia, unspecified: Secondary | ICD-10-CM

## 2016-08-09 DIAGNOSIS — I129 Hypertensive chronic kidney disease with stage 1 through stage 4 chronic kidney disease, or unspecified chronic kidney disease: Secondary | ICD-10-CM

## 2016-08-09 DIAGNOSIS — D701 Agranulocytosis secondary to cancer chemotherapy: Secondary | ICD-10-CM

## 2016-08-09 DIAGNOSIS — Q214 Aortopulmonary septal defect: Secondary | ICD-10-CM

## 2016-08-09 LAB — BASIC METABOLIC PANEL
Anion gap: 5 (ref 5–15)
BUN: 7 mg/dL (ref 6–20)
CO2: 25 mmol/L (ref 22–32)
Calcium: 8.7 mg/dL — ABNORMAL LOW (ref 8.9–10.3)
Chloride: 102 mmol/L (ref 101–111)
Creatinine, Ser: 0.74 mg/dL (ref 0.61–1.24)
GFR calc Af Amer: >60 mL/min (ref >60)
GFR calc non Af Amer: >60 mL/min (ref >60)
Glucose, Bld: 120 mg/dL — ABNORMAL HIGH (ref 65–99)
Potassium: 4.3 mmol/L (ref 3.5–5.1)
Sodium: 132 mmol/L — ABNORMAL LOW (ref 135–145)

## 2016-08-09 LAB — MAGNESIUM: Magnesium: 1.5 mg/dL — ABNORMAL LOW (ref 1.7–2.4)

## 2016-08-09 LAB — CBC WITH DIFFERENTIAL/PLATELET
Basophils Absolute: 0 10*3/uL (ref 0–0.1)
Basophils Relative: 1 %
Eosinophils Absolute: 0 10*3/uL (ref 0–0.7)
Eosinophils Relative: 2 %
HCT: 32.5 % — ABNORMAL LOW (ref 40.0–52.0)
Hemoglobin: 11.2 g/dL — ABNORMAL LOW (ref 13.0–18.0)
Lymphocytes Relative: 35 %
Lymphs Abs: 0.9 10*3/uL — ABNORMAL LOW (ref 1.0–3.6)
MCH: 30.4 pg (ref 26.0–34.0)
MCHC: 34.4 g/dL (ref 32.0–36.0)
MCV: 88.6 fL (ref 80.0–100.0)
Monocytes Absolute: 0.2 10*3/uL (ref 0.2–1.0)
Monocytes Relative: 7 %
Neutro Abs: 1.4 10*3/uL (ref 1.4–6.5)
Neutrophils Relative %: 55 %
Platelets: 115 10*3/uL — ABNORMAL LOW (ref 150–440)
RBC: 3.67 MIL/uL — ABNORMAL LOW (ref 4.40–5.90)
RDW: 13.7 % (ref 11.5–14.5)
WBC: 2.5 10*3/uL — ABNORMAL LOW (ref 3.8–10.6)

## 2016-08-09 MED ORDER — DIPHENHYDRAMINE HCL 50 MG/ML IJ SOLN: 25.0000 mg | Freq: Once | INTRAMUSCULAR | Status: DC | PRN

## 2016-08-09 MED ORDER — DIPHENHYDRAMINE HCL 50 MG/ML IJ SOLN: 50.0000 mg | Freq: Once | INTRAMUSCULAR | Status: DC | PRN

## 2016-08-09 MED ORDER — ALBUTEROL SULFATE (2.5 MG/3ML) 0.083% IN NEBU: 2.5000 mg | INHALATION_SOLUTION | Freq: Once | RESPIRATORY_TRACT | Status: DC | PRN

## 2016-08-09 MED ORDER — EPINEPHRINE HCL 0.1 MG/ML IJ SOLN: 0.2500 mg | Freq: Once | INTRAMUSCULAR | Status: DC | PRN

## 2016-08-09 MED ORDER — SODIUM CHLORIDE 0.9 % IV SOLN: Freq: Once | INTRAVENOUS | Status: DC | PRN

## 2016-08-09 MED ORDER — METHYLPREDNISOLONE SODIUM SUCC 125 MG IJ SOLR: 125.0000 mg | Freq: Once | INTRAMUSCULAR | Status: DC | PRN

## 2016-08-09 MED ORDER — MAGNESIUM SULFATE 2 GM/50ML IV SOLN
2.0000 g | Freq: Once | INTRAVENOUS | Status: AC
  Administered 2016-08-09: 2 g via INTRAVENOUS
  Filled 2016-08-09: qty 50

## 2016-08-09 MED ORDER — HEPARIN SOD (PORK) LOCK FLUSH 100 UNIT/ML IV SOLN
500.0000 [IU] | Freq: Once | INTRAVENOUS | Status: AC
  Administered 2016-08-09: 500 [IU] via INTRAVENOUS

## 2016-08-09 MED ORDER — DENOSUMAB 120 MG/1.7ML ~~LOC~~ SOLN: 120.0000 mg | Freq: Once | SUBCUTANEOUS | Status: DC

## 2016-08-09 MED ORDER — OXYCODONE HCL ER 10 MG PO T12A: 10.0000 mg | EXTENDED_RELEASE_TABLET | Freq: Two times a day (BID) | ORAL | 0 refills | Status: DC

## 2016-08-09 NOTE — Telephone Encounter (Signed)
Dr. Mike Gip spoke with Dr. Jenna Luo over the phone r/t low BP and Dr. Jenna Luo recommended holding lisinopril at this time, patient instructed, voiced understanding.

## 2016-08-09 NOTE — Progress Notes (Signed)
[facility] day:  08/09/2016   Chief Complaint: Philip [name] is a 56 y.o. male with extensive stage small cell lung cancer who is seen for nadir assessment on day 10 of cycle #1 carboplatin and etoposide and prior to monthly Xgeva.  HPI:  The patient was last seen in the medical oncology clinic on 07/31/2016.  At that time, he had recently been discharged from the hospital.  CVA work-up was negative.  He noted a chronic cough productive of clear sputum with streaks of blood.  He began cycle #1 carboplatin and etoposide.  He received OnPro Neulasta on 08/02/2016.  Symptomatically, he states his chemotherapy went fine.  He described some vomiting after the OnPro Neulasta came off.  He took a nausea pill.   He states the Ativan helped a little.  He was only sick for 2 days. He states that his pain pill brings his pain from a level 7 to a 4 and works about 4 hours. He continues to cough up phlegm. He has an appointment with Dr. Jenna Luo of cardiology.     Past Medical History:  Diagnosis Date  . Anemia   . CAD (coronary artery disease)    a. 08/2014 Inf STEMI/CABG x 3 (LIMA->LAD, VG->Diag, RIMA->RCA).  . DVT, recurrent, lower extremity, acute, left ([place]) 2016  . Hyperlipidemia   . Hypertension   . Hypertensive heart disease   . Lung cancer ([place])   . PAD (peripheral artery disease) ([place])    a. 03/2015 Periph Angio: short occlusion of L pop w/ evidence of embolization into the DP->5.0x50 mm Inova self-expanding stent;  b. 04/08/2015 ABI: R 1.12, L 0.99.  . Tobacco abuse   . Toe amputation status, left [facility]) 07/09/2016   2017    Past Surgical History:  Procedure Laterality Date  . CARDIAC CATHETERIZATION N/A 08/24/2014   Procedure: Left Heart Cath and Coronary Angiography;  Surgeon: Isaias Cowman, MD;  Location: [place];  Service: Cardiovascular;  Laterality: N/A;  . CARDIAC CATHETERIZATION N/A 08/24/2014   Procedure: Coronary  Stent Intervention;  Surgeon: Isaias Cowman, MD;  Location: [facility];  Service: Cardiovascular;  Laterality: N/A;  . CORONARY ARTERY BYPASS GRAFT N/A 08/25/2014   Procedure: CORONARY ARTERY BYPASS GRAFTING (CABG), ON PUMP, TIMES THREE, USING BILATERAL MAMMARY ARTERIES, RIGHT GREATER SAPHENOUS VEIN HARVESTED ENDOSCOPICALLY;  Surgeon: Melrose Nakayama, MD;  Location: [facility];  Service: Open Heart Surgery;  Laterality: N/A;  . PERIPHERAL VASCULAR CATHETERIZATION N/A 03/30/2015   Procedure: Abdominal Aortogram w/Lower Extremity;  Surgeon: Wellington Hampshire, MD;  Location: [place] [facility];  Service: Cardiovascular;  Laterality: N/A;  . PORTA CATH INSERTION N/A 07/27/2016   Procedure: Glori Luis Cath Insertion;  Surgeon: Algernon Huxley, MD;  Location: [facility] [facility];  Service: Cardiovascular;  Laterality: N/A;  . TEE WITHOUT CARDIOVERSION N/A 08/25/2014   Procedure: TRANSESOPHAGEAL ECHOCARDIOGRAM (TEE);  Surgeon: Melrose Nakayama, MD;  Location: [facility];  Service: Open Heart Surgery;  Laterality: N/A;    Family History  Problem Relation Age of Onset  . Hypertension Father   . Lung cancer Father   . Heart attack Mother 31    STENT  . Hyperlipidemia Mother   . Heart attack Brother   . Heart attack Brother   . Breast cancer Sister   . Heart attack Sister   . Colon cancer Paternal Grandfather     Social History:  reports that he has been smoking  Cigarettes.  He has been smoking about 0.50 packs per day. He has never used smokeless tobacco. He reports that he drinks alcohol. He reports that he does not use drugs.  The patient smokes 1/2 pack of cigarettes/day (previously 2 1/2 ppd).  He started smoking in his teens.  He denies any exposure to radiation or toxins.  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  He lives in [place].  His wife is named Publishing copy.  Allergies:  Allergies  Allergen Reactions  . Amitriptyline Other (See Comments)    Burning sensation all over   . Contrast Media [Iodinated Diagnostic Agents] Other (See Comments)    Pt states that he got "knots behind his ears".  . Nsaids Other (See Comments)    Other reaction(s): Other (See Comments) Reaction:  Blood in stool  Reaction:  Blood in stool   . Ibuprofen Other (See Comments) and Nausea Only    Blood in stools    Current Medications: Current Outpatient Prescriptions  Medication Sig Dispense Refill  . atorvastatin (LIPITOR) 40 MG tablet TAKE 1 TABLET BY MOUTH DAILY 90 tablet 3  . chlorpheniramine-HYDROcodone (TUSSIONEX PENNKINETIC ER) 10-8 MG/5ML SUER Take 5 mLs by mouth every 12 (twelve) hours as needed for cough. 140 mL 0  . clopidogrel (PLAVIX) 75 MG tablet Take 75 mg by mouth daily.    . diphenhydrAMINE (BENADRYL) 25 mg capsule Take 25 mg by mouth 2 (two) times daily as needed for allergies.     Marland Kitchen lidocaine-prilocaine (EMLA) cream Apply to affected area once 30 g 3  . lisinopril (PRINIVIL,ZESTRIL) 5 MG tablet TAKE 1 TABLET BY MOUTH DAILY 90 tablet 1  . LORazepam (ATIVAN) 0.5 MG tablet Take 1 tablet (0.5 mg total) by mouth every 6 (six) hours as needed (Nausea or vomiting). 30 tablet 0  . metoprolol tartrate (LOPRESSOR) 25 MG tablet Take 1 tablet (25 mg total) by mouth 2 (two) times daily. 180 tablet 3  . omeprazole (PRILOSEC) 20 MG capsule Take 20 mg by mouth daily.    . ondansetron (ZOFRAN) 8 MG tablet Take 1 tablet (8 mg total) by mouth 2 (two) times daily as needed for refractory nausea / vomiting. Start on day 3 after carboplatin chemo. 30 tablet 1  . oxyCODONE (OXY IR/ROXICODONE) 5 MG immediate release tablet Take 1 tablet (5 mg total) by mouth every 4 (four) hours as needed for severe pain. 40 tablet 0  . HYDROcodone-acetaminophen (NORCO/VICODIN) 5-325 MG tablet Take 1 tablet by mouth every 6 (six) hours as needed for moderate pain. (Patient not taking: Reported on 08/09/2016) 40 tablet 0   No current facility-administered medications for this visit.     Review of Systems:   GENERAL:  Fatigue. No fevers or sweats.  Weight down 2 pounds. PERFORMANCE STATUS (ECOG):  1 HEENT:  No visual changes, runny nose, sore throat, mouth sores or tenderness. Lungs: No shortness of breath.  Cough productive of phlegm.  No hemoptysis. Cardiac:  Chest pain.  No palpitations, orthopnea, or PND. GI:  Appetite affected by coughing.  Two days of feeling sick post chemotherapy.  No diarrhea, constipation, melena or hematochezia.  Colonoscopy 30 years ago.   GU:  No urgency, frequency, dysuria, or hematuria. Musculoskeletal:  No back pain.  No joint pain.  No muscle tenderness. Extremities:  No pain or swelling. Left toe removed (old). Skin:  No rashes or skin changes. Neuro:  No headache, numbness or weakness, balance or coordination issues. Endocrine:  No  diabetes, thyroid issues, hot flashes or night sweats. Psych:  No mood changes, depression or anxiety. Pain:  Back pain 7 out of 10 (improved with pain meds to a 4 out of 10). Review of systems:  All other systems reviewed and found to be negative.  Physical Exam: Blood pressure 98/64, pulse 88, temperature (!) 96.8 F (36 C), temperature source Tympanic, resp. rate 18, weight 154 lb (69.9 kg). GENERAL:  Well developed, well nourished, gentleman sitting comfortably in the exam room in no acute distress. MENTAL STATUS: Alert and oriented to person, place and time. HEAD:Black hair with graying. Normocephalic, atraumatic, face symmetric, no Cushingoid features. EYES:[place]es. Pupils equal round and reactive to light and accomodation. No conjunctivitis or scleral icterus. KHV:[id number] clear without lesion. Tonguenormal. Poor dentition. Mucous membranes moist. RESPIRATORY:Clear to auscultationwithout rales, wheezes or rhonchi. CARDIOVASCULAR:Regular rate andrhythmwithout murmur, rub or gallop. ABDOMEN:Soft, non-tender, with active bowel sounds, and no hepatosplenomegaly. No masses. SKIN: No rashes,  ulcers or lesions. EXTREMITIES: No lower extremity edema, no skin discoloration or tenderness. No palpable cords. LYMPHNODES: No palpable cervical, supraclavicular, axillary or inguinal adenopathy  NEUROLOGICAL: Unremarkable. PSYCH: Appropriate.   Appointment on 08/09/2016  Component Date Value Ref Range Status  . WBC 08/09/2016 2.5* 3.8 - 10.6 K/uL Final  . RBC 08/09/2016 3.67* 4.40 - 5.90 MIL/uL Final  . Hemoglobin 08/09/2016 11.2* 13.0 - 18.0 g/dL Final  . HCT 08/09/2016 32.5* 40.0 - 52.0 % Final  . MCV 08/09/2016 88.6  80.0 - 100.0 fL Final  . MCH 08/09/2016 30.4  26.0 - 34.0 pg Final  . MCHC 08/09/2016 34.4  32.0 - 36.0 g/dL Final  . RDW 08/09/2016 13.7  11.5 - 14.5 % Final  . Platelets 08/09/2016 115* 150 - 440 K/uL Final  . Neutrophils Relative % 08/09/2016 55  % Final  . Neutro Abs 08/09/2016 1.4  1.4 - 6.5 K/uL Final  . Lymphocytes Relative 08/09/2016 35  % Final  . Lymphs Abs 08/09/2016 0.9* 1.0 - 3.6 K/uL Final  . Monocytes Relative 08/09/2016 7  % Final  . Monocytes Absolute 08/09/2016 0.2  0.2 - 1.0 K/uL Final  . Eosinophils Relative 08/09/2016 2  % Final  . Eosinophils Absolute 08/09/2016 0.0  0 - 0.7 K/uL Final  . Basophils Relative 08/09/2016 1  % Final  . Basophils Absolute 08/09/2016 0.0  0 - 0.1 K/uL Final  . Sodium 08/09/2016 132* 135 - 145 mmol/L Final  . Potassium 08/09/2016 4.3  3.5 - 5.1 mmol/L Final  . Chloride 08/09/2016 102  101 - 111 mmol/L Final  . CO2 08/09/2016 25  22 - 32 mmol/L Final  . Glucose, Bld 08/09/2016 120* 65 - 99 mg/dL Final  . BUN 08/09/2016 7  6 - 20 mg/dL Final  . Creatinine, Ser 08/09/2016 0.74  0.61 - 1.24 mg/dL Final  . Calcium 08/09/2016 8.7* 8.9 - 10.3 mg/dL Final  . GFR calc non Af Amer 08/09/2016 >60  >60 mL/min Final  . GFR calc Af Amer 08/09/2016 >60  >60 mL/min Final   Comment: (NOTE) The eGFR has been calculated using the CKD EPI equation. This calculation has not been validated in all clinical situations. eGFR's  persistently <60 mL/min signify possible Chronic Kidney Disease.   . Anion gap 08/09/2016 5  5 - 15 Final  . Magnesium 08/09/2016 1.5* 1.7 - 2.4 mg/dL Final    Assessment:  Philip [name] is a 56 y.o. male with extensive stage small cell lung cancer.  He presented with a 2 year  history of night sweats and a 36 pound weight loss in 6 months.  Right supraclavicular biopsy on 07/19/2016 revealed small cell carcinoma of the lung.  CEA was 1.7 on 07/31/2016.  Chest CT on 06/28/2016 revealed extensive adenopathy.  There were multiple lymph nodes descending thoracic aorta, largest measuring 2.1 x 1.7 cm.  There was adenopathy in the aortopulmonary window with the largest confluence of lymph nodes measuring 4 x 2.6 cm. There was hilar adenopathy on the left measuring 2 x 1.9 cm. There were multiple enlarged lymph nodes in the pretracheal region, largest 1.7 x 1.7 cm.  There were lymph nodes to the left of the carina measuring 2.8 x 1.9 cm. There was a subcarinal lymph node measuring 2.4 x 1.6 cm. There was a 4 x 3 mm nodular opacity in the anterior segment of the left upper lobe.    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  There was nodal metastasis within the chest and supraclavicular/low jugular regions.  There was multifocal osseous metastasis.  There was possibly a pathologic fracture involving the posteromedial right tenth rib.  There was new left lower lobe opacity with mild hypermetabolism, suspicious for infection or postobstructive pneumonitis.  He completed a course of Levaquin for apost-obstructive pneumonia.  Head MRI on 07/26/2016 revealed no evidence of brain metastasis. There was punctate DWI hyperintensity along a high right frontal gyrus, possible recent infarct.  There was a small occipital bone metastasis.  Head MRA on 07/27/2016 revealed normal large and medium sized vessels.  Bilateral carotid duplex on 07/27/2016 revealed <50%  stenosis in the right and left internal carotis arteries.  Echo on 07/27/2016 revealed an EF of 45-50% without cardiac source of emboli.  He is currently day 10 s/p cycle #1 carboplatin and etoposide (07/31/2016) with OnPro Neulasta support.  He experienced 2 days of nausea after chemotherapy.  He has a history of left lower extremity DVT a few months after his cardiac surgery in 08/2014.  He has peripheral vascular disease s/p stent placement in 03/2015.  He is on a Plavix.  He has not had a colonoscopy in 30 years.  Symptomatically, he has a chronic cough.  Exam is stable.  He has mild hyponatremia (132), hypocalcemia (8.7), and hypomagnesemia (1.5).  ANC is 1400.  Plan: 1.  Labs today:  CBC with diff, BMP, Mg. 2.  Discuss side effects associated with chemotherapy.  Discuss management of nausea.  Discuss counts today.  He has mild leukopenia.  ANC is adequate.  Reviewed neutropenic precautions. 3.  Discuss electrolyte issues.  Hold Xgeva today secondary to low calcium.  Discuss calcium supplementation.  Plan for IV magnesium today. 4.  Discuss pain management.  Discuss switching to oxycodone.  Patient to keep a pain diary.  Plan to increase long acting pain medications based on diary. 5.  Magnesium sulfate 2 gm IV today. 6.  No Xgeva today. 7.  Rx: oxycodone 5 mg po q 4 hours prn pain (dis: #40). 8.  Rx: Oxycontin 10 mg po q 12 hours prn pain (dis: #30). 9.  RTC on 08/13/2016 for labs (CBC with diff, BMP) + Xgeva 10.  RTC on 08/21/2016 (as previously scheduled) for MD assessment, labs, and cycle #2 carboplatin and etoposide.  Addendum:  Patient received Xgeva on 08/13/2016.  Calcium was 9.0. Counts included a hematocrit 34.5, hemoglobin 11.9, platelets 78,000, white count 7100 with an [place] of 5600.   Lequita Asal, MD  08/09/2016, 2:04 PM

## 2016-08-09 NOTE — Progress Notes (Signed)
Patient is not sleeping well.  States he is up every couple of hours with pain in his upper left back.  States his pain medication is not keeping him comfortable.  Also states Zofran is not working for him either.  When he takes it, he ends up throwing it back up.

## 2016-08-13 ENCOUNTER — Inpatient Hospital Stay: Payer: Medicaid Other

## 2016-08-13 ENCOUNTER — Telehealth: Payer: Self-pay | Admitting: *Deleted

## 2016-08-13 DIAGNOSIS — Z5111 Encounter for antineoplastic chemotherapy: Secondary | ICD-10-CM | POA: Diagnosis not present

## 2016-08-13 DIAGNOSIS — C7951 Secondary malignant neoplasm of bone: Secondary | ICD-10-CM

## 2016-08-13 DIAGNOSIS — C3490 Malignant neoplasm of unspecified part of unspecified bronchus or lung: Secondary | ICD-10-CM

## 2016-08-13 DIAGNOSIS — Z7189 Other specified counseling: Secondary | ICD-10-CM

## 2016-08-13 DIAGNOSIS — G893 Neoplasm related pain (acute) (chronic): Secondary | ICD-10-CM

## 2016-08-13 LAB — CBC WITH DIFFERENTIAL/PLATELET
Basophils Absolute: 0 10*3/uL (ref 0–0.1)
Basophils Relative: 1 %
Eosinophils Absolute: 0 10*3/uL (ref 0–0.7)
Eosinophils Relative: 1 %
HCT: 34.5 % — ABNORMAL LOW (ref 40.0–52.0)
Hemoglobin: 11.9 g/dL — ABNORMAL LOW (ref 13.0–18.0)
Lymphocytes Relative: 16 %
Lymphs Abs: 1.2 10*3/uL (ref 1.0–3.6)
MCH: 30.5 pg (ref 26.0–34.0)
MCHC: 34.6 g/dL (ref 32.0–36.0)
MCV: 88.1 fL (ref 80.0–100.0)
Monocytes Absolute: 0.3 10*3/uL (ref 0.2–1.0)
Monocytes Relative: 4 %
Neutro Abs: 5.6 10*3/uL (ref 1.4–6.5)
Neutrophils Relative %: 78 %
Platelets: 78 10*3/uL — ABNORMAL LOW (ref 150–440)
RBC: 3.92 MIL/uL — ABNORMAL LOW (ref 4.40–5.90)
RDW: 13.8 % (ref 11.5–14.5)
WBC: 7.1 10*3/uL (ref 3.8–10.6)

## 2016-08-13 LAB — BASIC METABOLIC PANEL
Anion gap: 8 (ref 5–15)
BUN: 8 mg/dL (ref 6–20)
CO2: 22 mmol/L (ref 22–32)
Calcium: 9 mg/dL (ref 8.9–10.3)
Chloride: 101 mmol/L (ref 101–111)
Creatinine, Ser: 0.89 mg/dL (ref 0.61–1.24)
GFR calc Af Amer: >60 mL/min (ref >60)
GFR calc non Af Amer: >60 mL/min (ref >60)
Glucose, Bld: 173 mg/dL — ABNORMAL HIGH (ref 65–99)
Potassium: 3.8 mmol/L (ref 3.5–5.1)
Sodium: 131 mmol/L — ABNORMAL LOW (ref 135–145)

## 2016-08-13 MED ORDER — DENOSUMAB 120 MG/1.7ML ~~LOC~~ SOLN
120.0000 mg | Freq: Once | SUBCUTANEOUS | Status: AC
  Administered 2016-08-13: 120 mg via SUBCUTANEOUS
  Filled 2016-08-13: qty 1.7

## 2016-08-13 NOTE — Telephone Encounter (Signed)
Called patient to inform him that his sodium is good and his platelets are stable.

## 2016-08-19 DIAGNOSIS — E871 Hypo-osmolality and hyponatremia: Secondary | ICD-10-CM | POA: Insufficient documentation

## 2016-08-19 NOTE — Progress Notes (Signed)
[facility]  [phone] [phone]  ID: [name] OB: 30-Mar-1961  MR#: [id number]  [id number]#:[id number]  Patient Care Team: [name], MD as PCP - General (Family Medicine)   Chief Complaint: Philip [name] is a 56 y.o. male with extensive stage small cell lung cancer.  HPI:   patient returns to clinic today for further evaluation and consideration of cycle 2 of carboplatinum and etoposide. He tolerated his first treatment well with only some minimal nausea and vomiting that has since resolved. His pain is also better controlled. Currently he feels well. He continues to have a chronic cough. He has no neurologic complaints. He denies any recent fevers. He has a fair appetite. He denies any chest pain or shortness of breath. He denies any nausea, vomiting, constipation, or diarrhea. He has no urinary complaints. Patient otherwise feels well and offers no further specific complaints.   REVIEW OF SYSTEMS:   Review of Systems  Constitutional: Positive for malaise/fatigue. Negative for fever and weight loss.  Respiratory: Positive for cough. Negative for hemoptysis and shortness of breath.   Cardiovascular: Negative.  Negative for leg swelling.  Gastrointestinal: Negative.  Negative for abdominal pain, nausea and vomiting.  Genitourinary: Negative.   Musculoskeletal: Negative.   Skin: Negative.  Negative for rash.  Neurological: Positive for weakness.  Psychiatric/Behavioral: Negative.  The patient is not nervous/anxious.     As per HPI. Otherwise, a complete review of systems is negative.  PAST MEDICAL HISTORY: Past Medical History:  Diagnosis Date  . Anemia   . CAD (coronary artery disease)    a. 08/2014 Inf STEMI/CABG x 3 (LIMA->LAD, VG->Diag, RIMA->RCA).  . DVT, recurrent, lower extremity, acute, left ([place]) 2016  . Hyperlipidemia   . Hypertension   . Hypertensive heart disease   . Lung cancer ([place])   . PAD (peripheral artery  disease) ([place])    a. 03/2015 Periph Angio: short occlusion of L pop w/ evidence of embolization into the DP->5.0x50 mm Inova self-expanding stent;  b. 04/08/2015 ABI: R 1.12, L 0.99.  . Tobacco abuse   . Toe amputation status, left ([place]) 07/09/2016   2017    PAST SURGICAL HISTORY: Past Surgical History:  Procedure Laterality Date  . CARDIAC CATHETERIZATION N/A 08/24/2014   Procedure: Left Heart Cath and Coronary Angiography;  Surgeon: Isaias Cowman, MD;  Location: [facility];  Service: Cardiovascular;  Laterality: N/A;  . CARDIAC CATHETERIZATION N/A 08/24/2014   Procedure: Coronary Stent Intervention;  Surgeon: Isaias Cowman, MD;  Location: [facility];  Service: Cardiovascular;  Laterality: N/A;  . CORONARY ARTERY BYPASS GRAFT N/A 08/25/2014   Procedure: CORONARY ARTERY BYPASS GRAFTING (CABG), ON PUMP, TIMES THREE, USING BILATERAL MAMMARY ARTERIES, RIGHT GREATER SAPHENOUS VEIN HARVESTED ENDOSCOPICALLY;  Surgeon: Melrose Nakayama, MD;  Location: [facility];  Service: Open Heart Surgery;  Laterality: N/A;  . PERIPHERAL VASCULAR CATHETERIZATION N/A 03/30/2015   Procedure: Abdominal Aortogram w/Lower Extremity;  Surgeon: Wellington Hampshire, MD;  Location: [facility];  Service: Cardiovascular;  Laterality: N/A;  . PORTA CATH INSERTION N/A 07/27/2016   Procedure: Glori Luis Cath Insertion;  Surgeon: Algernon Huxley, MD;  Location: [facility];  Service: Cardiovascular;  Laterality: N/A;  . TEE WITHOUT CARDIOVERSION N/A 08/25/2014   Procedure: TRANSESOPHAGEAL ECHOCARDIOGRAM (TEE);  Surgeon: Melrose Nakayama, MD;  Location: [facility];  Service: Open Heart Surgery;  Laterality: N/A;    FAMILY HISTORY: Family History  Problem Relation Age of Onset  .  Hypertension Father   . Lung cancer Father   . Heart attack Mother 20       STENT  . Hyperlipidemia Mother   . Heart attack Brother   . Heart attack Brother   . Breast cancer Sister   . Heart attack Sister   .  Colon cancer Paternal Grandfather     ADVANCED DIRECTIVES (Y/N):  N  HEALTH MAINTENANCE: Social History  Substance Use Topics  . Smoking status: Current Every Day Smoker    Packs/day: 0.50    Types: Cigarettes  . Smokeless tobacco: Never Used     Comment: 4cigs day down from 2ppd  . Alcohol use 0.0 oz/week     Comment: Sometimes on w/e.     Colonoscopy:  PAP:  Bone density:  Lipid panel:  Allergies  Allergen Reactions  . Amitriptyline Other (See Comments)    Burning sensation all over  . Contrast Media [Iodinated Diagnostic Agents] Other (See Comments)    Pt states that he got "knots behind his ears".  . Nsaids Other (See Comments)    Other reaction(s): Other (See Comments) Reaction:  Blood in stool  Reaction:  Blood in stool   . Ibuprofen Other (See Comments) and Nausea Only    Blood in stools    Current Outpatient Prescriptions  Medication Sig Dispense Refill  . atorvastatin (LIPITOR) 40 MG tablet TAKE 1 TABLET BY MOUTH DAILY 90 tablet 3  . chlorpheniramine-HYDROcodone (TUSSIONEX PENNKINETIC ER) 10-8 MG/5ML SUER Take 5 mLs by mouth every 12 (twelve) hours as needed for cough. 140 mL 0  . clopidogrel (PLAVIX) 75 MG tablet Take 75 mg by mouth daily.    . diphenhydrAMINE (BENADRYL) 25 mg capsule Take 25 mg by mouth 2 (two) times daily as needed for allergies.     Marland Kitchen lidocaine-prilocaine (EMLA) cream Apply to affected area once 30 g 3  . lisinopril (PRINIVIL,ZESTRIL) 5 MG tablet TAKE 1 TABLET BY MOUTH DAILY 90 tablet 1  . LORazepam (ATIVAN) 0.5 MG tablet Take 1 tablet (0.5 mg total) by mouth every 6 (six) hours as needed (Nausea or vomiting). 30 tablet 0  . metoprolol tartrate (LOPRESSOR) 25 MG tablet Take 1 tablet (25 mg total) by mouth 2 (two) times daily. 180 tablet 3  . omeprazole (PRILOSEC) 20 MG capsule Take 20 mg by mouth daily.    . ondansetron (ZOFRAN) 8 MG tablet Take 1 tablet (8 mg total) by mouth 2 (two) times daily as needed for refractory nausea / vomiting.  Start on day 3 after carboplatin chemo. 30 tablet 1  . oxyCODONE (OXY IR/ROXICODONE) 5 MG immediate release tablet Take 1 tablet (5 mg total) by mouth every 4 (four) hours as needed for severe pain. 30 tablet 0  . oxyCODONE (OXYCONTIN) 10 mg 12 hr tablet Take 1 tablet (10 mg total) by mouth every 8 (eight) hours as needed. 30 tablet 0  . promethazine (PHENERGAN) 25 MG suppository Place 1 suppository (25 mg total) rectally every 6 (six) hours as needed for nausea or vomiting. 12 each 5   Current Facility-Administered Medications  Medication Dose Route Frequency Provider Last Rate Last Dose  . ondansetron (ZOFRAN-ODT) disintegrating tablet 8 mg  8 mg Oral Once Lloyd Huger, MD        OBJECTIVE: Vitals:   08/21/16 1036  BP: (!) 149/87  Pulse: 75  Temp: 97.5 F (36.4 C)     Body mass index is 20.6 kg/m.    ECOG FS:1 - Symptomatic but completely ambulatory  General: Well-developed, well-nourished, no acute distress. Eyes: Pink conjunctiva, anicteric sclera. HEENT: Normocephalic, moist mucous membranes, clear oropharnyx. Lungs: Clear to auscultation bilaterally. Heart: Regular rate and rhythm. No rubs, murmurs, or gallops. Abdomen: Soft, nontender, nondistended. No organomegaly noted, normoactive bowel sounds. Musculoskeletal: No edema, cyanosis, or clubbing. Neuro: Alert, answering all questions appropriately. Cranial nerves grossly intact. Skin: No rashes or petechiae noted. Psych: Normal affect. Lymphatics: No cervical, calvicular, axillary or inguinal LAD.   LAB RESULTS:  Lab Results  Component Value Date   NA 128 (L) 08/21/2016   K 3.6 08/21/2016   CL 98 (L) 08/21/2016   CO2 23 08/21/2016   GLUCOSE 110 (H) 08/21/2016   BUN 5 (L) 08/21/2016   CREATININE 0.74 08/21/2016   CALCIUM 7.8 (L) 08/21/2016   PROT 7.1 08/21/2016   ALBUMIN 3.3 (L) 08/21/2016   AST 18 08/21/2016   ALT 14 (L) 08/21/2016   ALKPHOS 134 (H) 08/21/2016   BILITOT 0.4 08/21/2016   GFRNONAA >60  08/21/2016   GFRAA >60 08/21/2016    Lab Results  Component Value Date   WBC 8.1 08/21/2016   NEUTROABS 6.2 08/21/2016   HGB 11.5 (L) 08/21/2016   HCT 31.9 (L) 08/21/2016   MCV 85.9 08/21/2016   PLT 375 08/21/2016     STUDIES: US Carotid Bilateral (at [facility] Only)  Result Date: 07/27/2016 CLINICAL DATA:  Stroke EXAM: BILATERAL CAROTID DUPLEX ULTRASOUND TECHNIQUE: Pearline Cables scale imaging, color Doppler and duplex ultrasound were performed of bilateral carotid and vertebral arteries in the neck. COMPARISON:  None. FINDINGS: Criteria: Quantification of carotid stenosis is based on velocity parameters that correlate the residual internal carotid diameter with NASCET-based stenosis levels, using the diameter of the distal internal carotid lumen as the denominator for stenosis measurement. The following velocity measurements were obtained: RIGHT ICA:  101 cm/sec CCA:  87 cm/sec SYSTOLIC ICA/CCA RATIO:  1.2 DIASTOLIC ICA/CCA RATIO:  1.8 ECA:  105 cm/sec LEFT ICA:  93 cm/sec CCA:  96 cm/sec SYSTOLIC ICA/CCA RATIO:  1.0 DIASTOLIC ICA/CCA RATIO:  1.5 ECA:  124 cm/sec RIGHT CAROTID ARTERY: Mild calcified plaque in the bulb. Low resistance internal carotid Doppler pattern. RIGHT VERTEBRAL ARTERY:  Antegrade. LEFT CAROTID ARTERY: Mild calcified plaque in the bulb. Low resistance internal carotid Doppler pattern. LEFT VERTEBRAL ARTERY:  Antegrade. IMPRESSION: Less than 50% stenosis in the right and left internal carotid arteries. Electronically Signed   By: Marybelle Killings M.D.   On: 07/27/2016 10:39   Mr Jodene Nam Head/brain GN Cm  Result Date: 07/27/2016 CLINICAL DATA:  Hypertension. Recent demonstration of small focus of restricted diffusion in knee right posterior frontal vertex. EXAM: MRA HEAD WITHOUT CONTRAST TECHNIQUE: Angiographic images of the Circle of Willis were obtained using MRA technique without intravenous contrast. COMPARISON:  07/26/2016 FINDINGS: Both internal carotid arteries are widely patent  into the brain. Mild irregularity in the siphon regions but no stenosis. Anterior and middle cerebral vessels are patent without proximal stenosis, aneurysm or vascular malformation. Both vertebral arteries are widely patent to the basilar. No basilar stenosis. Posterior circulation branch vessels appear normal. IMPRESSION: Normal intracranial MR angiography of the large and medium size vessels. I think the finding on parenchymal imaging yesterday could be a small infarction, but there is considerable concern for the potential of a nonenhancing metastasis. Electronically Signed   By: Nelson Chimes M.D.   On: 07/27/2016 10:50   Assessment:  AKASHDEEP CHUBA is a 56 y.o. male with extensive stage small cell lung cancer.  He  presented with a 2 year history of night sweats and a 36 pound weight loss in 6 months.  Right supraclavicular biopsy on 07/19/2016 revealed small cell carcinoma of the lung.  CEAwas 1.7 on 07/31/2016.  Chest CT on 06/28/2016 revealed extensive adenopathy.  There were multiple lymph nodes descending thoracic aorta, largest measuring 2.1 x 1.7 cm.  There was adenopathy in the aortopulmonary window with the largest confluence of lymph nodes measuring 4 x 2.6 cm. There was hilar adenopathy on the left measuring 2 x 1.9 cm. There were multiple enlarged lymph nodes in the pretracheal region, largest 1.7 x 1.7 cm.  There were lymph nodes to the left of the carina measuring 2.8 x 1.9 cm. There was a subcarinal lymph node measuring 2.4 x 1.6 cm. There was a 4 x 3 mm nodular opacity in the anterior segment of the left upper lobe.    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  There was nodal metastasis within the chest and supraclavicular/low jugular regions.  There was multifocal osseous metastasis.  There was possibly a pathologic fracture involving the posteromedial right tenth rib.  There was new left lower lobe opacity with mild  hypermetabolism, suspicious for infection or postobstructive pneumonitis.  He completed a course of Levaquin for apost-obstructive pneumonia.  Head MRI on 07/26/2016 revealed no evidence of brain metastasis. There was punctate DWI hyperintensity along a high right frontal gyrus, possible recent infarct.  There was a small occipital bone metastasis.  Head MRA on 07/27/2016 revealed normal large and medium sized vessels.  Bilateral carotid duplex on 07/27/2016 revealed <50% stenosis in the right and left internal carotis arteries.  Echo on 07/27/2016 revealed an EF of 45-50% without cardiac source of emboli.   He has a history of left lower extremity DVT a few months after his cardiac surgery in 08/2014.  He has peripheral vascular disease s/p stent placement in 03/2015.  He is on a Plavix.  He has not had a colonoscopy in 30 years.   Plan: 1.  Labs today. Adequate to proceed with treatment. 2.  Small cell lung cancer: Proceed with cycle 2 of carboplatinum and etoposide today. Side effects were previously discussed. Patient's last dose of Xgeva was given on August 13, 2016.  3. Pain control: Continue current narcotic regimen. 4. Hypomagnesemia: Patient's magnesium within normal limits today. 5. Hyponatremia: Patient's sodium levels 128 today. Monitor. 6. Follow-up: Return to clinic in 1 and 2 days for etoposide only. Then in 3 weeks for consideration of cycle 3.   Patient expressed understanding and was in agreement with this plan. He also understands that He can call clinic at any time with any questions, concerns, or complaints.    Lloyd Huger, MD   08/26/2016 9:17 AM

## 2016-08-21 ENCOUNTER — Inpatient Hospital Stay: Payer: Medicaid Other

## 2016-08-21 ENCOUNTER — Encounter: Payer: Self-pay | Admitting: Oncology

## 2016-08-21 ENCOUNTER — Inpatient Hospital Stay (HOSPITAL_BASED_OUTPATIENT_CLINIC_OR_DEPARTMENT_OTHER): Payer: Medicaid Other | Admitting: Oncology

## 2016-08-21 ENCOUNTER — Inpatient Hospital Stay: Payer: Medicaid Other | Attending: Oncology

## 2016-08-21 VITALS — BP 149/87 | HR 75 | Temp 97.5°F | Wt 147.7 lb

## 2016-08-21 DIAGNOSIS — F17210 Nicotine dependence, cigarettes, uncomplicated: Secondary | ICD-10-CM

## 2016-08-21 DIAGNOSIS — C778 Secondary and unspecified malignant neoplasm of lymph nodes of multiple regions: Secondary | ICD-10-CM | POA: Diagnosis not present

## 2016-08-21 DIAGNOSIS — C7951 Secondary malignant neoplasm of bone: Secondary | ICD-10-CM

## 2016-08-21 DIAGNOSIS — E871 Hypo-osmolality and hyponatremia: Secondary | ICD-10-CM | POA: Diagnosis not present

## 2016-08-21 DIAGNOSIS — Z7189 Other specified counseling: Secondary | ICD-10-CM

## 2016-08-21 DIAGNOSIS — R5383 Other fatigue: Secondary | ICD-10-CM | POA: Insufficient documentation

## 2016-08-21 DIAGNOSIS — I739 Peripheral vascular disease, unspecified: Secondary | ICD-10-CM | POA: Diagnosis not present

## 2016-08-21 DIAGNOSIS — Z800 Family history of malignant neoplasm of digestive organs: Secondary | ICD-10-CM

## 2016-08-21 DIAGNOSIS — Z803 Family history of malignant neoplasm of breast: Secondary | ICD-10-CM | POA: Insufficient documentation

## 2016-08-21 DIAGNOSIS — C3411 Malignant neoplasm of upper lobe, right bronchus or lung: Secondary | ICD-10-CM | POA: Diagnosis not present

## 2016-08-21 DIAGNOSIS — E785 Hyperlipidemia, unspecified: Secondary | ICD-10-CM | POA: Insufficient documentation

## 2016-08-21 DIAGNOSIS — I2510 Atherosclerotic heart disease of native coronary artery without angina pectoris: Secondary | ICD-10-CM | POA: Insufficient documentation

## 2016-08-21 DIAGNOSIS — Z86718 Personal history of other venous thrombosis and embolism: Secondary | ICD-10-CM

## 2016-08-21 DIAGNOSIS — I219 Acute myocardial infarction, unspecified: Secondary | ICD-10-CM | POA: Insufficient documentation

## 2016-08-21 DIAGNOSIS — Z5111 Encounter for antineoplastic chemotherapy: Secondary | ICD-10-CM | POA: Diagnosis not present

## 2016-08-21 DIAGNOSIS — C3490 Malignant neoplasm of unspecified part of unspecified bronchus or lung: Secondary | ICD-10-CM

## 2016-08-21 DIAGNOSIS — I10 Essential (primary) hypertension: Secondary | ICD-10-CM | POA: Insufficient documentation

## 2016-08-21 DIAGNOSIS — R531 Weakness: Secondary | ICD-10-CM | POA: Insufficient documentation

## 2016-08-21 DIAGNOSIS — Z801 Family history of malignant neoplasm of trachea, bronchus and lung: Secondary | ICD-10-CM | POA: Insufficient documentation

## 2016-08-21 DIAGNOSIS — Z79899 Other long term (current) drug therapy: Secondary | ICD-10-CM | POA: Insufficient documentation

## 2016-08-21 DIAGNOSIS — Z87891 Personal history of nicotine dependence: Secondary | ICD-10-CM | POA: Insufficient documentation

## 2016-08-21 DIAGNOSIS — G893 Neoplasm related pain (acute) (chronic): Secondary | ICD-10-CM

## 2016-08-21 DIAGNOSIS — I252 Old myocardial infarction: Secondary | ICD-10-CM

## 2016-08-21 DIAGNOSIS — K219 Gastro-esophageal reflux disease without esophagitis: Secondary | ICD-10-CM | POA: Insufficient documentation

## 2016-08-21 DIAGNOSIS — M14671 Charcot's joint, right ankle and foot: Secondary | ICD-10-CM | POA: Insufficient documentation

## 2016-08-21 DIAGNOSIS — C3412 Malignant neoplasm of upper lobe, left bronchus or lung: Secondary | ICD-10-CM

## 2016-08-21 LAB — CBC WITH DIFFERENTIAL/PLATELET
Basophils Absolute: 0.1 10*3/uL (ref 0–0.1)
Basophils Relative: 1 %
Eosinophils Absolute: 0 10*3/uL (ref 0–0.7)
Eosinophils Relative: 0 %
HCT: 31.9 % — ABNORMAL LOW (ref 40.0–52.0)
Hemoglobin: 11.5 g/dL — ABNORMAL LOW (ref 13.0–18.0)
Lymphocytes Relative: 15 %
Lymphs Abs: 1.2 10*3/uL (ref 1.0–3.6)
MCH: 30.9 pg (ref 26.0–34.0)
MCHC: 36 g/dL (ref 32.0–36.0)
MCV: 85.9 fL (ref 80.0–100.0)
Monocytes Absolute: 0.6 10*3/uL (ref 0.2–1.0)
Monocytes Relative: 8 %
Neutro Abs: 6.2 10*3/uL (ref 1.4–6.5)
Neutrophils Relative %: 76 %
Platelets: 375 10*3/uL (ref 150–440)
RBC: 3.71 MIL/uL — ABNORMAL LOW (ref 4.40–5.90)
RDW: 13.9 % (ref 11.5–14.5)
WBC: 8.1 10*3/uL (ref 3.8–10.6)

## 2016-08-21 LAB — MAGNESIUM: Magnesium: 2 mg/dL (ref 1.7–2.4)

## 2016-08-21 LAB — COMPREHENSIVE METABOLIC PANEL
ALT: 14 U/L — ABNORMAL LOW (ref 17–63)
AST: 18 U/L (ref 15–41)
Albumin: 3.3 g/dL — ABNORMAL LOW (ref 3.5–5.0)
Alkaline Phosphatase: 134 U/L — ABNORMAL HIGH (ref 38–126)
Anion gap: 7 (ref 5–15)
BUN: 5 mg/dL — ABNORMAL LOW (ref 6–20)
CO2: 23 mmol/L (ref 22–32)
Calcium: 7.8 mg/dL — ABNORMAL LOW (ref 8.9–10.3)
Chloride: 98 mmol/L — ABNORMAL LOW (ref 101–111)
Creatinine, Ser: 0.74 mg/dL (ref 0.61–1.24)
GFR calc Af Amer: >60 mL/min (ref >60)
GFR calc non Af Amer: >60 mL/min (ref >60)
Glucose, Bld: 110 mg/dL — ABNORMAL HIGH (ref 65–99)
Potassium: 3.6 mmol/L (ref 3.5–5.1)
Sodium: 128 mmol/L — ABNORMAL LOW (ref 135–145)
Total Bilirubin: 0.4 mg/dL (ref 0.3–1.2)
Total Protein: 7.1 g/dL (ref 6.5–8.1)

## 2016-08-21 MED ORDER — SODIUM CHLORIDE 0.9% FLUSH
10.0000 mL | INTRAVENOUS | Status: DC | PRN
  Administered 2016-08-21: 10 mL
  Filled 2016-08-21: qty 10

## 2016-08-21 MED ORDER — PALONOSETRON HCL INJECTION 0.25 MG/5ML
0.2500 mg | Freq: Once | INTRAVENOUS | Status: AC
  Administered 2016-08-21: 0.25 mg via INTRAVENOUS
  Filled 2016-08-21: qty 5

## 2016-08-21 MED ORDER — SODIUM CHLORIDE 0.9 % IV SOLN
630.0000 mg | Freq: Once | INTRAVENOUS | Status: AC
  Administered 2016-08-21: 630 mg via INTRAVENOUS
  Filled 2016-08-21: qty 63

## 2016-08-21 MED ORDER — OXYCODONE HCL ER 10 MG PO T12A: 10.0000 mg | EXTENDED_RELEASE_TABLET | Freq: Three times a day (TID) | ORAL | 0 refills | Status: DC | PRN

## 2016-08-21 MED ORDER — ETOPOSIDE CHEMO INJECTION 1 GM/50ML
100.0000 mg/m2 | Freq: Once | INTRAVENOUS | Status: AC
  Administered 2016-08-21: 190 mg via INTRAVENOUS
  Filled 2016-08-21: qty 9.5

## 2016-08-21 MED ORDER — HYDROCOD POLST-CPM POLST ER 10-8 MG/5ML PO SUER: 5.0000 mL | Freq: Two times a day (BID) | ORAL | 0 refills | Status: DC | PRN

## 2016-08-21 MED ORDER — DEXAMETHASONE SODIUM PHOSPHATE 10 MG/ML IJ SOLN
10.0000 mg | Freq: Once | INTRAMUSCULAR | Status: AC
  Administered 2016-08-21: 10 mg via INTRAVENOUS
  Filled 2016-08-21: qty 1

## 2016-08-21 MED ORDER — PROMETHAZINE HCL 25 MG RE SUPP: 25.0000 mg | Freq: Four times a day (QID) | RECTAL | 5 refills | Status: DC | PRN

## 2016-08-21 MED ORDER — OXYCODONE HCL 5 MG PO TABS: 5.0000 mg | ORAL_TABLET | ORAL | 0 refills | Status: DC | PRN

## 2016-08-21 MED ORDER — HEPARIN SOD (PORK) LOCK FLUSH 100 UNIT/ML IV SOLN
500.0000 [IU] | Freq: Once | INTRAVENOUS | Status: AC | PRN
  Administered 2016-08-21: 500 [IU]
  Filled 2016-08-21: qty 5

## 2016-08-21 MED ORDER — SODIUM CHLORIDE 0.9 % IV SOLN
Freq: Once | INTRAVENOUS | Status: AC
  Administered 2016-08-21: 10:00:00 via INTRAVENOUS
  Filled 2016-08-21: qty 1000

## 2016-08-22 ENCOUNTER — Inpatient Hospital Stay: Payer: Medicaid Other

## 2016-08-22 DIAGNOSIS — C3490 Malignant neoplasm of unspecified part of unspecified bronchus or lung: Secondary | ICD-10-CM

## 2016-08-22 DIAGNOSIS — Z5111 Encounter for antineoplastic chemotherapy: Secondary | ICD-10-CM | POA: Diagnosis not present

## 2016-08-22 MED ORDER — SODIUM CHLORIDE 0.9 % IV SOLN
Freq: Once | INTRAVENOUS | Status: AC
  Administered 2016-08-22: 14:00:00 via INTRAVENOUS
  Filled 2016-08-22: qty 1000

## 2016-08-22 MED ORDER — DEXAMETHASONE SODIUM PHOSPHATE 10 MG/ML IJ SOLN
10.0000 mg | Freq: Once | INTRAMUSCULAR | Status: AC
  Administered 2016-08-22: 10 mg via INTRAVENOUS
  Filled 2016-08-22: qty 1

## 2016-08-22 MED ORDER — SODIUM CHLORIDE 0.9 % IV SOLN
100.0000 mg/m2 | Freq: Once | INTRAVENOUS | Status: AC
  Administered 2016-08-22: 190 mg via INTRAVENOUS
  Filled 2016-08-22: qty 9.5

## 2016-08-22 MED ORDER — HEPARIN SOD (PORK) LOCK FLUSH 100 UNIT/ML IV SOLN
500.0000 [IU] | Freq: Once | INTRAVENOUS | Status: AC | PRN
  Administered 2016-08-22: 500 [IU]
  Filled 2016-08-22: qty 5

## 2016-08-23 ENCOUNTER — Inpatient Hospital Stay: Payer: Medicaid Other

## 2016-08-23 ENCOUNTER — Other Ambulatory Visit: Payer: Self-pay | Admitting: Hematology and Oncology

## 2016-08-23 VITALS — BP 125/79 | HR 71 | Temp 97.0°F | Resp 16

## 2016-08-23 DIAGNOSIS — C3490 Malignant neoplasm of unspecified part of unspecified bronchus or lung: Secondary | ICD-10-CM

## 2016-08-23 DIAGNOSIS — R110 Nausea: Secondary | ICD-10-CM

## 2016-08-23 DIAGNOSIS — Z5111 Encounter for antineoplastic chemotherapy: Secondary | ICD-10-CM | POA: Diagnosis not present

## 2016-08-23 MED ORDER — HEPARIN SOD (PORK) LOCK FLUSH 100 UNIT/ML IV SOLN
500.0000 [IU] | Freq: Once | INTRAVENOUS | Status: AC | PRN
  Administered 2016-08-23: 500 [IU]
  Filled 2016-08-23: qty 5

## 2016-08-23 MED ORDER — ONDANSETRON 8 MG PO TBDP
8.0000 mg | ORAL_TABLET | Freq: Once | ORAL | Status: AC
  Administered 2016-08-23: 8 mg via ORAL
  Filled 2016-08-23: qty 1

## 2016-08-23 MED ORDER — SODIUM CHLORIDE 0.9% FLUSH
10.0000 mL | INTRAVENOUS | Status: DC | PRN
  Administered 2016-08-23: 10 mL
  Filled 2016-08-23: qty 10

## 2016-08-23 MED ORDER — SODIUM CHLORIDE 0.9 % IV SOLN
Freq: Once | INTRAVENOUS | Status: AC
  Administered 2016-08-23: 14:00:00 via INTRAVENOUS
  Filled 2016-08-23: qty 1000

## 2016-08-23 MED ORDER — DEXAMETHASONE SODIUM PHOSPHATE 10 MG/ML IJ SOLN
10.0000 mg | Freq: Once | INTRAMUSCULAR | Status: AC
  Administered 2016-08-23: 10 mg via INTRAVENOUS
  Filled 2016-08-23: qty 1

## 2016-08-23 MED ORDER — SODIUM CHLORIDE 0.9 % IV SOLN
100.0000 mg/m2 | Freq: Once | INTRAVENOUS | Status: AC
  Administered 2016-08-23: 190 mg via INTRAVENOUS
  Filled 2016-08-23: qty 9.5

## 2016-08-23 MED ORDER — ONDANSETRON 8 MG PO TBDP
8.0000 mg | ORAL_TABLET | Freq: Once | ORAL | Status: DC
Start: 2016-08-23 — End: 2016-11-15

## 2016-08-23 MED ORDER — PEGFILGRASTIM 6 MG/0.6ML ~~LOC~~ PSKT
6.0000 mg | PREFILLED_SYRINGE | Freq: Once | SUBCUTANEOUS | Status: AC
  Administered 2016-08-23: 6 mg via SUBCUTANEOUS
  Filled 2016-08-23: qty 0.6

## 2016-08-24 ENCOUNTER — Telehealth: Payer: Self-pay | Admitting: *Deleted

## 2016-08-24 NOTE — Telephone Encounter (Signed)
Informed pt that fax has been received from social security disability to request records. Pt verbalized understanding.

## 2016-08-27 ENCOUNTER — Telehealth: Payer: Self-pay | Admitting: *Deleted

## 2016-08-27 NOTE — Telephone Encounter (Signed)
Concerned because he does not have an appt for Xgeva for his bone cancer. Please advise

## 2016-08-27 NOTE — Telephone Encounter (Signed)
Per Dr Mike Gip, set up Delton See at his next appt 5/29

## 2016-08-29 ENCOUNTER — Other Ambulatory Visit: Payer: Self-pay | Admitting: *Deleted

## 2016-08-29 DIAGNOSIS — C7951 Secondary malignant neoplasm of bone: Secondary | ICD-10-CM

## 2016-08-29 DIAGNOSIS — Z7189 Other specified counseling: Secondary | ICD-10-CM

## 2016-08-29 DIAGNOSIS — C3490 Malignant neoplasm of unspecified part of unspecified bronchus or lung: Secondary | ICD-10-CM

## 2016-08-29 DIAGNOSIS — G893 Neoplasm related pain (acute) (chronic): Secondary | ICD-10-CM

## 2016-08-29 MED ORDER — LORAZEPAM 0.5 MG PO TABS: 0.5000 mg | ORAL_TABLET | Freq: Four times a day (QID) | ORAL | 0 refills | Status: DC | PRN

## 2016-08-29 MED ORDER — OXYCODONE HCL ER 10 MG PO T12A: 10.0000 mg | EXTENDED_RELEASE_TABLET | Freq: Three times a day (TID) | ORAL | 0 refills | Status: DC | PRN

## 2016-08-29 NOTE — Telephone Encounter (Signed)
  Signed today  M

## 2016-08-29 NOTE — Telephone Encounter (Signed)
If you sign this today, please call patient to come to [place] to pick it up [phone]

## 2016-09-04 ENCOUNTER — Other Ambulatory Visit: Payer: Self-pay | Admitting: Hematology and Oncology

## 2016-09-04 ENCOUNTER — Telehealth: Payer: Self-pay | Admitting: *Deleted

## 2016-09-04 ENCOUNTER — Inpatient Hospital Stay: Payer: Medicaid Other

## 2016-09-04 DIAGNOSIS — Z5189 Encounter for other specified aftercare: Secondary | ICD-10-CM

## 2016-09-04 DIAGNOSIS — Z5111 Encounter for antineoplastic chemotherapy: Secondary | ICD-10-CM | POA: Diagnosis not present

## 2016-09-04 DIAGNOSIS — C3490 Malignant neoplasm of unspecified part of unspecified bronchus or lung: Secondary | ICD-10-CM

## 2016-09-04 DIAGNOSIS — G893 Neoplasm related pain (acute) (chronic): Secondary | ICD-10-CM

## 2016-09-04 DIAGNOSIS — D696 Thrombocytopenia, unspecified: Secondary | ICD-10-CM

## 2016-09-04 LAB — CBC WITH DIFFERENTIAL/PLATELET
Basophils Absolute: 0.1 10*3/uL (ref 0–0.1)
Basophils Relative: 1 %
Eosinophils Absolute: 0 10*3/uL (ref 0–0.7)
Eosinophils Relative: 0 %
HCT: 25.7 % — ABNORMAL LOW (ref 40.0–52.0)
Hemoglobin: 9.1 g/dL — ABNORMAL LOW (ref 13.0–18.0)
Lymphocytes Relative: 14 %
Lymphs Abs: 2.2 10*3/uL (ref 1.0–3.6)
MCH: 30.5 pg (ref 26.0–34.0)
MCHC: 35.2 g/dL (ref 32.0–36.0)
MCV: 86.5 fL (ref 80.0–100.0)
Monocytes Absolute: 0.5 10*3/uL (ref 0.2–1.0)
Monocytes Relative: 3 %
Neutro Abs: 13 10*3/uL — ABNORMAL HIGH (ref 1.4–6.5)
Neutrophils Relative %: 82 %
Platelets: 45 10*3/uL — ABNORMAL LOW (ref 150–440)
RBC: 2.97 MIL/uL — ABNORMAL LOW (ref 4.40–5.90)
RDW: 14.1 % (ref 11.5–14.5)
WBC: 15.7 10*3/uL — ABNORMAL HIGH (ref 3.8–10.6)

## 2016-09-04 MED ORDER — OXYCODONE HCL 5 MG PO TABS: 5.0000 mg | ORAL_TABLET | Freq: Four times a day (QID) | ORAL | 0 refills | Status: DC | PRN

## 2016-09-04 NOTE — Telephone Encounter (Signed)
Per VO Dr Philip [name] check CBC and see if he is taking any ASA or IBU. Per patient, he denies taking ASA or IBU. He has agreed to come in at 4:00 for lab. Lab staff notified Philip [name])

## 2016-09-04 NOTE — Telephone Encounter (Signed)
Spoke with Caryl Pina at [facility] disability determination office. She is requesting medical records for this patient that need to be faxed urgently in order to make determination for disability benefits. Caryl Pina stated that request has been faxed multiple times. Request has been scanned into system. Requested records have been faxed to Caryl Pina so pt may start receiving disability benefits.

## 2016-09-04 NOTE — Telephone Encounter (Signed)
Patient called inquiring if he needs to be evaluated by Dr Mike Gip due to having a ruptured blood vessel in his eye. States his eye is red. Please advise

## 2016-09-05 ENCOUNTER — Inpatient Hospital Stay: Payer: Medicaid Other

## 2016-09-05 DIAGNOSIS — Z5111 Encounter for antineoplastic chemotherapy: Secondary | ICD-10-CM | POA: Diagnosis not present

## 2016-09-05 DIAGNOSIS — D696 Thrombocytopenia, unspecified: Secondary | ICD-10-CM

## 2016-09-05 LAB — CBC
HCT: 25.3 % — ABNORMAL LOW (ref 40.0–52.0)
Hemoglobin: 8.8 g/dL — ABNORMAL LOW (ref 13.0–18.0)
MCH: 30.1 pg (ref 26.0–34.0)
MCHC: 34.9 g/dL (ref 32.0–36.0)
MCV: 86.3 fL (ref 80.0–100.0)
Platelets: 40 10*3/uL — ABNORMAL LOW (ref 150–440)
RBC: 2.93 MIL/uL — ABNORMAL LOW (ref 4.40–5.90)
RDW: 14 % (ref 11.5–14.5)
WBC: 15.2 10*3/uL — ABNORMAL HIGH (ref 3.8–10.6)

## 2016-09-06 ENCOUNTER — Telehealth: Payer: Self-pay | Admitting: *Deleted

## 2016-09-06 ENCOUNTER — Other Ambulatory Visit: Payer: Self-pay | Admitting: *Deleted

## 2016-09-06 DIAGNOSIS — Z85118 Personal history of other malignant neoplasm of bronchus and lung: Secondary | ICD-10-CM

## 2016-09-06 NOTE — Telephone Encounter (Signed)
-----   Message from Lequita Asal, MD sent at 09/05/2016  5:11 PM EDT ----- Regarding: Call patient  Platelet count drifting down.  Check platelet count on Friday.  No aspirin or ibuprofen.  How is scleral hemorrhage?  M  ----- Message ----- From: Interface, Lab In [place] Sent: 09/05/2016   3:07 PM To: Lequita Asal, MD

## 2016-09-06 NOTE — Telephone Encounter (Signed)
Called patient to inform him that his platelets are drifting down.  MD wants to recheck his platelets on Friday.  Patient should not take and aspirin or ibuprofen.  Sclera hemorrhage is resolving.

## 2016-09-07 ENCOUNTER — Telehealth: Payer: Self-pay | Admitting: *Deleted

## 2016-09-07 ENCOUNTER — Inpatient Hospital Stay: Payer: Medicaid Other

## 2016-09-07 DIAGNOSIS — Z5111 Encounter for antineoplastic chemotherapy: Secondary | ICD-10-CM | POA: Diagnosis not present

## 2016-09-07 DIAGNOSIS — Z85118 Personal history of other malignant neoplasm of bronchus and lung: Secondary | ICD-10-CM

## 2016-09-07 LAB — CBC WITH DIFFERENTIAL/PLATELET
Basophils Absolute: 0 10*3/uL (ref 0–0.1)
Basophils Relative: 0 %
Eosinophils Absolute: 0 10*3/uL (ref 0–0.7)
Eosinophils Relative: 0 %
HCT: 24.4 % — ABNORMAL LOW (ref 40.0–52.0)
Hemoglobin: 8.6 g/dL — ABNORMAL LOW (ref 13.0–18.0)
Lymphocytes Relative: 10 %
Lymphs Abs: 1.6 10*3/uL (ref 1.0–3.6)
MCH: 30.4 pg (ref 26.0–34.0)
MCHC: 35.3 g/dL (ref 32.0–36.0)
MCV: 86.2 fL (ref 80.0–100.0)
Monocytes Absolute: 1 10*3/uL (ref 0.2–1.0)
Monocytes Relative: 6 %
Neutro Abs: 12.9 10*3/uL — ABNORMAL HIGH (ref 1.4–6.5)
Neutrophils Relative %: 84 %
Platelets: 59 10*3/uL — ABNORMAL LOW (ref 150–440)
RBC: 2.83 MIL/uL — ABNORMAL LOW (ref 4.40–5.90)
RDW: 14 % (ref 11.5–14.5)
WBC: 15.5 10*3/uL — ABNORMAL HIGH (ref 3.8–10.6)

## 2016-09-07 NOTE — Telephone Encounter (Signed)
-----   Message from Lequita Asal, MD sent at 09/07/2016 12:55 PM EDT ----- Regarding: Please call patient  Platelet count is better.  M  ----- Message ----- From: Interface, Lab In [place] Sent: 09/07/2016  10:46 AM To: Lequita Asal, MD

## 2016-09-07 NOTE — Telephone Encounter (Signed)
Called patient and LVM that his platelets are continuing to improve.

## 2016-09-10 DIAGNOSIS — Z5111 Encounter for antineoplastic chemotherapy: Secondary | ICD-10-CM | POA: Insufficient documentation

## 2016-09-10 NOTE — Progress Notes (Signed)
[facility] day:  09/11/2016   Chief Complaint: Philip [name] is a 56 y.o. male with extensive stage small cell lung cancer who is seen for assessment prior to cycle #3 carboplatin and etoposide and prior to monthly Xgeva.  HPI:  The patient was last seen in the medical oncology clinic by me on 08/13/2016.  At that time, he was seen for nadir visit after cycle #1.  He had a chronic cough.  Exam was stable.  He had mild hyponatremia (132), hypocalcemia (8.7), and hypomagnesemia (1.5).  ANC was 1400.  Platelet count was 78,000.  He saw Dr. Grayland Ormond in my absence on 08/21/2016.  He began cycle #2 carboplatin and etoposide.  He received OnPro Neulasta on 08/23/2016.  During the interim, he developed a left scleral hemorrhage.  Platelet count nadir was 40,000 on 09/05/2016 (day 16).  Symptomatically, he is feels "alright".  He feels "a little better than in the beginning".  Initially, he stated he couldn't breathe before treatment. He is fatigued. He denies any bruising or bleeding.   Past Medical History:  Diagnosis Date  . Anemia   . CAD (coronary artery disease)    a. 08/2014 Inf STEMI/CABG x 3 (LIMA->LAD, VG->Diag, RIMA->RCA).  . DVT, recurrent, lower extremity, acute, left ([place]) 2016  . Hyperlipidemia   . Hypertension   . Hypertensive heart disease   . Lung cancer ([place])   . PAD (peripheral artery disease) ([place])    a. 03/2015 Periph Angio: short occlusion of L pop w/ evidence of embolization into the DP->5.0x50 mm Inova self-expanding stent;  b. 04/08/2015 ABI: R 1.12, L 0.99.  . Tobacco abuse   . Toe amputation status, left [facility]) 07/09/2016   2017    Past Surgical History:  Procedure Laterality Date  . CARDIAC CATHETERIZATION N/A 08/24/2014   Procedure: Left Heart Cath and Coronary Angiography;  Surgeon: Isaias Cowman, MD;  Location: [place] [facility];  Service: Cardiovascular;  Laterality: N/A;  . CARDIAC CATHETERIZATION N/A  08/24/2014   Procedure: Coronary Stent Intervention;  Surgeon: Isaias Cowman, MD;  Location: [place] [place] [facility];  Service: Cardiovascular;  Laterality: N/A;  . CORONARY ARTERY BYPASS GRAFT N/A 08/25/2014   Procedure: CORONARY ARTERY BYPASS GRAFTING (CABG), ON PUMP, TIMES THREE, USING BILATERAL MAMMARY ARTERIES, RIGHT GREATER SAPHENOUS VEIN HARVESTED ENDOSCOPICALLY;  Surgeon: Melrose Nakayama, MD;  Location: [facility];  Service: Open Heart Surgery;  Laterality: N/A;  . PERIPHERAL VASCULAR CATHETERIZATION N/A 03/30/2015   Procedure: Abdominal Aortogram w/Lower Extremity;  Surgeon: Wellington Hampshire, MD;  Location: [place] [facility];  Service: Cardiovascular;  Laterality: N/A;  . PORTA CATH INSERTION N/A 07/27/2016   Procedure: Glori Luis Cath Insertion;  Surgeon: Algernon Huxley, MD;  Location: [place] [facility];  Service: Cardiovascular;  Laterality: N/A;  . TEE WITHOUT CARDIOVERSION N/A 08/25/2014   Procedure: TRANSESOPHAGEAL ECHOCARDIOGRAM (TEE);  Surgeon: Melrose Nakayama, MD;  Location: [facility];  Service: Open Heart Surgery;  Laterality: N/A;    Family History  Problem Relation Age of Onset  . Hypertension Father   . Lung cancer Father   . Heart attack Mother 25       STENT  . Hyperlipidemia Mother   . Heart attack Brother   . Heart attack Brother   . Breast cancer Sister   . Heart attack Sister   . Colon cancer Paternal Grandfather     Social History:  reports that he has been smoking Cigarettes.  He has been smoking about 0.50 packs per day. He has never used smokeless tobacco. He reports that he drinks alcohol. He reports that he does not use drugs.  The patient smokes 1/2 pack of cigarettes/day (previously 2 1/2 ppd).  He started smoking in his teens.  He denies any exposure to radiation or toxins.  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, [place].  He lives in [place].  His wife is named Publishing copy.  He is accompanied by his wife today.  Allergies:  Allergies  Allergen  Reactions  . Amitriptyline Other (See Comments)    Burning sensation all over  . Contrast Media [Iodinated Diagnostic Agents] Other (See Comments)    Pt states that he got "knots behind his ears".  . Nsaids Other (See Comments)    Other reaction(s): Other (See Comments) Reaction:  Blood in stool  Reaction:  Blood in stool   . Ibuprofen Other (See Comments) and Nausea Only    Blood in stools    Current Medications: Current Outpatient Prescriptions  Medication Sig Dispense Refill  . atorvastatin (LIPITOR) 40 MG tablet TAKE 1 TABLET BY MOUTH DAILY 90 tablet 3  . chlorpheniramine-HYDROcodone (TUSSIONEX PENNKINETIC ER) 10-8 MG/5ML SUER Take 5 mLs by mouth every 12 (twelve) hours as needed for cough. 140 mL 0  . diphenhydrAMINE (BENADRYL) 25 mg capsule Take 25 mg by mouth 2 (two) times daily as needed for allergies.     Marland Kitchen lidocaine-prilocaine (EMLA) cream Apply to affected area once 30 g 3  . lisinopril (PRINIVIL,ZESTRIL) 5 MG tablet TAKE 1 TABLET BY MOUTH DAILY 90 tablet 1  . LORazepam (ATIVAN) 0.5 MG tablet Take 1 tablet (0.5 mg total) by mouth every 6 (six) hours as needed (Nausea or vomiting). 30 tablet 0  . metoprolol tartrate (LOPRESSOR) 25 MG tablet Take 1 tablet (25 mg total) by mouth 2 (two) times daily. 180 tablet 3  . omeprazole (PRILOSEC) 20 MG capsule Take 20 mg by mouth daily.    . ondansetron (ZOFRAN) 8 MG tablet Take 1 tablet (8 mg total) by mouth 2 (two) times daily as needed for refractory nausea / vomiting. Start on day 3 after carboplatin chemo. 30 tablet 1  . oxyCODONE (OXY IR/ROXICODONE) 5 MG immediate release tablet Take 1 tablet (5 mg total) by mouth every 6 (six) hours as needed for severe pain. 30 tablet 0  . oxyCODONE (OXYCONTIN) 10 mg 12 hr tablet Take 1 tablet (10 mg total) by mouth every 8 (eight) hours as needed. 45 tablet 0  . promethazine (PHENERGAN) 25 MG suppository Place 1 suppository (25 mg total) rectally every 6 (six) hours as needed for nausea or vomiting.  12 each 5  . clopidogrel (PLAVIX) 75 MG tablet Take 75 mg by mouth daily.    Marland Kitchen oxyCODONE (OXY IR/ROXICODONE) 5 MG immediate release tablet Take 1 tablet (5 mg total) by mouth every 4 (four) hours as needed for severe pain. (Patient not taking: Reported on 09/11/2016) 30 tablet 0   Current Facility-Administered Medications  Medication Dose Route Frequency Provider Last Rate Last Dose  . ondansetron (ZOFRAN-ODT) disintegrating tablet 8 mg  8 mg Oral Once Lloyd Huger, MD        Review of Systems:  GENERAL:  Fatigue. No fevers or sweats.  Weight down 2 pounds in 3 weeks. PERFORMANCE STATUS (ECOG):  1 HEENT:  No visual changes, runny nose, sore throat, mouth sores or tenderness. Lungs: No shortness of breath or cough.  No  hemoptysis. Cardiac:  No chest pain, palpitations, orthopnea, or PND. GI:  Appetite 75%.  No diarrhea, constipation, melena or hematochezia.  Colonoscopy 30 years ago.   GU:  No urgency, frequency, dysuria, or hematuria. Musculoskeletal:  No back pain.  No joint pain.  No muscle tenderness. Extremities:  No pain or swelling. Left toe removed (old). Skin:  No rashes or skin changes. Neuro:  No headache, numbness or weakness, balance or coordination issues. Endocrine:  No diabetes, thyroid issues, hot flashes or night sweats. Psych:  No mood changes, depression or anxiety. Pain:  No pain. Review of systems:  All other systems reviewed and found to be negative.  Physical Exam: Blood pressure 121/77, pulse 80, temperature 97.6 F (36.4 C), temperature source Tympanic, resp. rate 18, weight 145 lb (65.8 kg). GENERAL:  Well developed, well nourished, gentleman sitting comfortably in the exam room in no acute distress. MENTAL STATUS: Alert and oriented to person, place and time. HEAD:Alopecia.  Normocephalic, atraumatic, face symmetric, no Cushingoid features. EYES:[place]. No scleral hemorrhage.  Pupils equal round and reactive to light and accomodation. No  conjunctivitis or scleral icterus. JQB:HALPFXTKWI clear without lesion. Tonguenormal. Poor dentition. Mucous membranes moist. RESPIRATORY:Clear to auscultationwithout rales, wheezes or rhonchi. CARDIOVASCULAR:Regular rate andrhythmwithout murmur, rub or gallop. ABDOMEN:Soft, non-tender, with active bowel sounds, and no hepatosplenomegaly. No masses. SKIN: No rashes, ulcers or lesions. EXTREMITIES: No lower extremity edema, no skin discoloration or tenderness. No palpable cords. LYMPHNODES: No palpable cervical, supraclavicular, axillary or inguinal adenopathy  NEUROLOGICAL: Unremarkable. PSYCH: Appropriate.   Infusion on 09/11/2016  Component Date Value Ref Range Status  . WBC 09/11/2016 7.2  3.8 - 10.6 K/uL Final  . RBC 09/11/2016 3.04* 4.40 - 5.90 MIL/uL Final  . Hemoglobin 09/11/2016 9.6* 13.0 - 18.0 g/dL Final  . HCT 09/11/2016 26.5* 40.0 - 52.0 % Final  . MCV 09/11/2016 87.2  80.0 - 100.0 fL Final  . MCH 09/11/2016 31.5  26.0 - 34.0 pg Final  . MCHC 09/11/2016 36.1* 32.0 - 36.0 g/dL Final  . RDW 09/11/2016 14.3  11.5 - 14.5 % Final  . Platelets 09/11/2016 222  150 - 440 K/uL Final  . Neutrophils Relative % 09/11/2016 75  % Final  . Neutro Abs 09/11/2016 5.4  1.4 - 6.5 K/uL Final  . Lymphocytes Relative 09/11/2016 16  % Final  . Lymphs Abs 09/11/2016 1.1  1.0 - 3.6 K/uL Final  . Monocytes Relative 09/11/2016 8  % Final  . Monocytes Absolute 09/11/2016 0.6  0.2 - 1.0 K/uL Final  . Eosinophils Relative 09/11/2016 0  % Final  . Eosinophils Absolute 09/11/2016 0.0  0 - 0.7 K/uL Final  . Basophils Relative 09/11/2016 1  % Final  . Basophils Absolute 09/11/2016 0.0  0 - 0.1 K/uL Final  . Sodium 09/11/2016 137  135 - 145 mmol/L Final  . Potassium 09/11/2016 4.1  3.5 - 5.1 mmol/L Final  . Chloride 09/11/2016 109  101 - 111 mmol/L Final  . CO2 09/11/2016 24  22 - 32 mmol/L Final  . Glucose, Bld 09/11/2016 97  65 - 99 mg/dL Final  . BUN 09/11/2016 5* 6 - 20 mg/dL  Final  . Creatinine, Ser 09/11/2016 0.76  0.61 - 1.24 mg/dL Final  . Calcium 09/11/2016 7.7* 8.9 - 10.3 mg/dL Final  . Total Protein 09/11/2016 6.6  6.5 - 8.1 g/dL Final  . Albumin 09/11/2016 3.2* 3.5 - 5.0 g/dL Final  . AST 09/11/2016 21  15 - 41 U/L Final  . ALT 09/11/2016  17  17 - 63 U/L Final  . Alkaline Phosphatase 09/11/2016 124  38 - 126 U/L Final  . Total Bilirubin 09/11/2016 0.4  0.3 - 1.2 mg/dL Final  . GFR calc non Af Amer 09/11/2016 >60  >60 mL/min Final  . GFR calc Af Amer 09/11/2016 >60  >60 mL/min Final   Comment: (NOTE) The eGFR has been calculated using the CKD EPI equation. This calculation has not been validated in all clinical situations. eGFR's persistently <60 mL/min signify possible Chronic Kidney Disease.   . Anion gap 09/11/2016 4* 5 - 15 Final  . Magnesium 09/11/2016 1.7  1.7 - 2.4 mg/dL Final    Assessment:  Philip [name] is a 56 y.o. male with extensive stage small cell lung cancer.  He presented with a 2 year history of night sweats and a 36 pound weight loss in 6 months.  Right supraclavicular biopsy on 07/19/2016 revealed small cell carcinoma of the lung.  CEA was 1.7 on 07/31/2016.  Chest CT on 06/28/2016 revealed extensive adenopathy.  There were multiple lymph nodes descending thoracic aorta, largest measuring 2.1 x 1.7 cm.  There was adenopathy in the aortopulmonary window with the largest confluence of lymph nodes measuring 4 x 2.6 cm. There was hilar adenopathy on the left measuring 2 x 1.9 cm. There were multiple enlarged lymph nodes in the pretracheal region, largest 1.7 x 1.7 cm.  There were lymph nodes to the left of the carina measuring 2.8 x 1.9 cm. There was a subcarinal lymph node measuring 2.4 x 1.6 cm. There was a 4 x 3 mm nodular opacity in the anterior segment of the left upper lobe.    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  There was nodal metastasis within the chest and  supraclavicular/low jugular regions.  There was multifocal osseous metastasis.  There was possibly a pathologic fracture involving the posteromedial right tenth rib.  There was new left lower lobe opacity with mild hypermetabolism, suspicious for infection or postobstructive pneumonitis.  He completed a course of Levaquin for apost-obstructive pneumonia.  Head MRI on 07/26/2016 revealed no evidence of brain metastasis. There was punctate DWI hyperintensity along a high right frontal gyrus, possible recent infarct.  There was a small occipital bone metastasis.  Head MRA on 07/27/2016 revealed normal large and medium sized vessels.  Bilateral carotid duplex on 07/27/2016 revealed <50% stenosis in the right and left internal carotis arteries.  Echo on 07/27/2016 revealed an EF of 45-50% without cardiac source of emboli.  He is s/p 2 cycles of carboplatin and etoposide (07/31/2016 - 08/21/2016) with OnPro Neulasta support.  Platelet nadir was 40,000 on day 16 of cycle #2.  He has a history of left lower extremity DVT a few months after his cardiac surgery in 08/2014.  He has peripheral vascular disease s/p stent placement in 03/2015.  He is on a Plavix.  He has not had a colonoscopy in 30 years.  Symptomatically, he remains fatigued.  Exam is unremarkable.  Corrected calcium is 8.34.  Plan: 1.  Labs today:  CBC with diff, CMP, Mg. 2.  Discuss plan for imaging every 2 cycles.  Discuss postponing chemotherapy x 1 day and obtaining a chest CT to assess disease. 3.  Discuss hypocalcemia and initiation of oral calcium (1200 mg a day with vitamin D 800 units). 4.  Chest CT without contrast today. 5.  No Xgeva today secondary to hypocalcemia. 3.  Day 1 of cycle #3 carboplatin  and etoposide tomorrow. 4.  RTC on 05/31 and 006/01 for etoposide. 5.  RTC on 06/01 for OnPro Neulasta 7.  RTC on 06/05 for BMP + Xgeva. 8.  RTC on 06/13 for labs (CBC with diff). 9.  RTC on 10/02/2016 for MD assessment, labs (CBC  with diff, CMP, Mg), and cycle #4 carboplatin and etoposide.   Lequita Asal, MD  09/11/2016, 9:23 AM

## 2016-09-11 ENCOUNTER — Inpatient Hospital Stay: Payer: Medicaid Other

## 2016-09-11 ENCOUNTER — Inpatient Hospital Stay (HOSPITAL_BASED_OUTPATIENT_CLINIC_OR_DEPARTMENT_OTHER): Payer: Medicaid Other | Admitting: Hematology and Oncology

## 2016-09-11 ENCOUNTER — Ambulatory Visit
Admission: RE | Admit: 2016-09-11 | Discharge: 2016-09-11 | Disposition: A | Discharge status: Home / Self Care | Payer: Medicaid Other | Source: Ambulatory Visit | Attending: Hematology and Oncology | Admitting: Hematology and Oncology

## 2016-09-11 ENCOUNTER — Other Ambulatory Visit: Payer: Self-pay | Admitting: Hematology and Oncology

## 2016-09-11 VITALS — BP 121/77 | HR 80 | Temp 97.6°F | Resp 18 | Wt 145.0 lb

## 2016-09-11 DIAGNOSIS — F17210 Nicotine dependence, cigarettes, uncomplicated: Secondary | ICD-10-CM

## 2016-09-11 DIAGNOSIS — I700 Atherosclerosis of aorta: Secondary | ICD-10-CM | POA: Diagnosis not present

## 2016-09-11 DIAGNOSIS — Z801 Family history of malignant neoplasm of trachea, bronchus and lung: Secondary | ICD-10-CM

## 2016-09-11 DIAGNOSIS — C7951 Secondary malignant neoplasm of bone: Secondary | ICD-10-CM | POA: Diagnosis not present

## 2016-09-11 DIAGNOSIS — Z87891 Personal history of nicotine dependence: Secondary | ICD-10-CM

## 2016-09-11 DIAGNOSIS — Z5111 Encounter for antineoplastic chemotherapy: Secondary | ICD-10-CM

## 2016-09-11 DIAGNOSIS — Z7189 Other specified counseling: Secondary | ICD-10-CM

## 2016-09-11 DIAGNOSIS — G893 Neoplasm related pain (acute) (chronic): Secondary | ICD-10-CM | POA: Diagnosis not present

## 2016-09-11 DIAGNOSIS — R590 Localized enlarged lymph nodes: Secondary | ICD-10-CM | POA: Insufficient documentation

## 2016-09-11 DIAGNOSIS — Z803 Family history of malignant neoplasm of breast: Secondary | ICD-10-CM

## 2016-09-11 DIAGNOSIS — K8020 Calculus of gallbladder without cholecystitis without obstruction: Secondary | ICD-10-CM | POA: Insufficient documentation

## 2016-09-11 DIAGNOSIS — I10 Essential (primary) hypertension: Secondary | ICD-10-CM

## 2016-09-11 DIAGNOSIS — I739 Peripheral vascular disease, unspecified: Secondary | ICD-10-CM

## 2016-09-11 DIAGNOSIS — Z86718 Personal history of other venous thrombosis and embolism: Secondary | ICD-10-CM

## 2016-09-11 DIAGNOSIS — Z800 Family history of malignant neoplasm of digestive organs: Secondary | ICD-10-CM

## 2016-09-11 DIAGNOSIS — C3490 Malignant neoplasm of unspecified part of unspecified bronchus or lung: Secondary | ICD-10-CM | POA: Diagnosis present

## 2016-09-11 DIAGNOSIS — R5383 Other fatigue: Secondary | ICD-10-CM

## 2016-09-11 DIAGNOSIS — E785 Hyperlipidemia, unspecified: Secondary | ICD-10-CM

## 2016-09-11 DIAGNOSIS — C778 Secondary and unspecified malignant neoplasm of lymph nodes of multiple regions: Secondary | ICD-10-CM

## 2016-09-11 DIAGNOSIS — I252 Old myocardial infarction: Secondary | ICD-10-CM

## 2016-09-11 DIAGNOSIS — Z79899 Other long term (current) drug therapy: Secondary | ICD-10-CM

## 2016-09-11 DIAGNOSIS — C3411 Malignant neoplasm of upper lobe, right bronchus or lung: Secondary | ICD-10-CM

## 2016-09-11 DIAGNOSIS — R531 Weakness: Secondary | ICD-10-CM

## 2016-09-11 DIAGNOSIS — I2510 Atherosclerotic heart disease of native coronary artery without angina pectoris: Secondary | ICD-10-CM

## 2016-09-11 DIAGNOSIS — E871 Hypo-osmolality and hyponatremia: Secondary | ICD-10-CM

## 2016-09-11 LAB — CBC WITH DIFFERENTIAL/PLATELET
Basophils Absolute: 0 10*3/uL (ref 0–0.1)
Basophils Relative: 1 %
Eosinophils Absolute: 0 10*3/uL (ref 0–0.7)
Eosinophils Relative: 0 %
HCT: 26.5 % — ABNORMAL LOW (ref 40.0–52.0)
Hemoglobin: 9.6 g/dL — ABNORMAL LOW (ref 13.0–18.0)
Lymphocytes Relative: 16 %
Lymphs Abs: 1.1 10*3/uL (ref 1.0–3.6)
MCH: 31.5 pg (ref 26.0–34.0)
MCHC: 36.1 g/dL — ABNORMAL HIGH (ref 32.0–36.0)
MCV: 87.2 fL (ref 80.0–100.0)
Monocytes Absolute: 0.6 10*3/uL (ref 0.2–1.0)
Monocytes Relative: 8 %
Neutro Abs: 5.4 10*3/uL (ref 1.4–6.5)
Neutrophils Relative %: 75 %
Platelets: 222 10*3/uL (ref 150–440)
RBC: 3.04 MIL/uL — ABNORMAL LOW (ref 4.40–5.90)
RDW: 14.3 % (ref 11.5–14.5)
WBC: 7.2 10*3/uL (ref 3.8–10.6)

## 2016-09-11 LAB — MAGNESIUM: Magnesium: 1.7 mg/dL (ref 1.7–2.4)

## 2016-09-11 LAB — IRON AND TIBC
Iron: 64 ug/dL (ref 45–182)
Saturation Ratios: 28 % (ref 17.9–39.5)
TIBC: 227 ug/dL — ABNORMAL LOW (ref 250–450)
UIBC: 163 ug/dL

## 2016-09-11 LAB — COMPREHENSIVE METABOLIC PANEL
ALT: 17 U/L (ref 17–63)
AST: 21 U/L (ref 15–41)
Albumin: 3.2 g/dL — ABNORMAL LOW (ref 3.5–5.0)
Alkaline Phosphatase: 124 U/L (ref 38–126)
Anion gap: 4 — ABNORMAL LOW (ref 5–15)
BUN: 5 mg/dL — ABNORMAL LOW (ref 6–20)
CO2: 24 mmol/L (ref 22–32)
Calcium: 7.7 mg/dL — ABNORMAL LOW (ref 8.9–10.3)
Chloride: 109 mmol/L (ref 101–111)
Creatinine, Ser: 0.76 mg/dL (ref 0.61–1.24)
GFR calc Af Amer: >60 mL/min (ref >60)
GFR calc non Af Amer: >60 mL/min (ref >60)
Glucose, Bld: 97 mg/dL (ref 65–99)
Potassium: 4.1 mmol/L (ref 3.5–5.1)
Sodium: 137 mmol/L (ref 135–145)
Total Bilirubin: 0.4 mg/dL (ref 0.3–1.2)
Total Protein: 6.6 g/dL (ref 6.5–8.1)

## 2016-09-11 LAB — FOLATE: Folate: 6.8 ng/mL (ref >5.9)

## 2016-09-11 LAB — VITAMIN B12: Vitamin B-12: 3055 pg/mL — ABNORMAL HIGH (ref 180–914)

## 2016-09-11 LAB — FERRITIN: Ferritin: 219 ng/mL (ref 24–336)

## 2016-09-11 MED ORDER — SODIUM CHLORIDE 0.9% FLUSH
10.0000 mL | Freq: Once | INTRAVENOUS | Status: AC
  Administered 2016-09-11: 10 mL via INTRAVENOUS
  Filled 2016-09-11: qty 10

## 2016-09-11 MED ORDER — LORAZEPAM 0.5 MG PO TABS: 0.5000 mg | ORAL_TABLET | Freq: Four times a day (QID) | ORAL | 0 refills | Status: DC | PRN

## 2016-09-11 MED ORDER — OXYCODONE HCL ER 10 MG PO T12A: 10.0000 mg | EXTENDED_RELEASE_TABLET | Freq: Three times a day (TID) | ORAL | 0 refills | Status: DC | PRN

## 2016-09-11 NOTE — Progress Notes (Signed)
Patient states he will be out of two of his medications on Saturday. Asking for refills for Oxycotin 10 mg and Lorazepam. Understands he cannot get them filled until then.

## 2016-09-12 ENCOUNTER — Inpatient Hospital Stay: Payer: Medicaid Other

## 2016-09-12 ENCOUNTER — Other Ambulatory Visit: Payer: Self-pay | Admitting: *Deleted

## 2016-09-12 VITALS — BP 116/78 | HR 76 | Temp 97.4°F | Resp 18

## 2016-09-12 DIAGNOSIS — C7951 Secondary malignant neoplasm of bone: Secondary | ICD-10-CM

## 2016-09-12 DIAGNOSIS — Z5111 Encounter for antineoplastic chemotherapy: Secondary | ICD-10-CM | POA: Diagnosis not present

## 2016-09-12 DIAGNOSIS — C3490 Malignant neoplasm of unspecified part of unspecified bronchus or lung: Secondary | ICD-10-CM

## 2016-09-12 MED ORDER — HEPARIN SOD (PORK) LOCK FLUSH 100 UNIT/ML IV SOLN
500.0000 [IU] | Freq: Once | INTRAVENOUS | Status: AC | PRN
  Administered 2016-09-12: 500 [IU]
  Filled 2016-09-12: qty 5

## 2016-09-12 MED ORDER — SODIUM CHLORIDE 0.9 % IV SOLN
100.0000 mg/m2 | Freq: Once | INTRAVENOUS | Status: AC
  Administered 2016-09-12: 190 mg via INTRAVENOUS
  Filled 2016-09-12: qty 9.5

## 2016-09-12 MED ORDER — DEXAMETHASONE SODIUM PHOSPHATE 10 MG/ML IJ SOLN
10.0000 mg | Freq: Once | INTRAMUSCULAR | Status: AC
  Administered 2016-09-12: 10 mg via INTRAVENOUS
  Filled 2016-09-12: qty 1

## 2016-09-12 MED ORDER — PALONOSETRON HCL INJECTION 0.25 MG/5ML
0.2500 mg | Freq: Once | INTRAVENOUS | Status: AC
  Administered 2016-09-12: 0.25 mg via INTRAVENOUS
  Filled 2016-09-12: qty 5

## 2016-09-12 MED ORDER — SODIUM CHLORIDE 0.9 % IV SOLN: 10.0000 mg | Freq: Once | INTRAVENOUS | Status: DC

## 2016-09-12 MED ORDER — DENOSUMAB 120 MG/1.7ML ~~LOC~~ SOLN: 120.0000 mg | Freq: Once | SUBCUTANEOUS | Status: DC

## 2016-09-12 MED ORDER — SODIUM CHLORIDE 0.9 % IV SOLN
630.0000 mg | Freq: Once | INTRAVENOUS | Status: AC
  Administered 2016-09-12: 630 mg via INTRAVENOUS
  Filled 2016-09-12: qty 63

## 2016-09-13 ENCOUNTER — Inpatient Hospital Stay: Payer: Medicaid Other

## 2016-09-13 DIAGNOSIS — C3490 Malignant neoplasm of unspecified part of unspecified bronchus or lung: Secondary | ICD-10-CM

## 2016-09-13 DIAGNOSIS — Z5111 Encounter for antineoplastic chemotherapy: Secondary | ICD-10-CM | POA: Diagnosis not present

## 2016-09-13 MED ORDER — ETOPOSIDE CHEMO INJECTION 1 GM/50ML
100.0000 mg/m2 | Freq: Once | INTRAVENOUS | Status: AC
  Administered 2016-09-13: 190 mg via INTRAVENOUS
  Filled 2016-09-13: qty 9.5

## 2016-09-13 MED ORDER — DEXAMETHASONE SODIUM PHOSPHATE 10 MG/ML IJ SOLN
10.0000 mg | Freq: Once | INTRAMUSCULAR | Status: AC
  Administered 2016-09-13: 10 mg via INTRAVENOUS
  Filled 2016-09-13: qty 1

## 2016-09-13 MED ORDER — SODIUM CHLORIDE 0.9 % IV SOLN
Freq: Once | INTRAVENOUS | Status: AC
  Administered 2016-09-13: 13:00:00 via INTRAVENOUS
  Filled 2016-09-13: qty 1000

## 2016-09-13 MED ORDER — HEPARIN SOD (PORK) LOCK FLUSH 100 UNIT/ML IV SOLN
500.0000 [IU] | Freq: Once | INTRAVENOUS | Status: AC | PRN
  Administered 2016-09-13: 500 [IU]
  Filled 2016-09-13: qty 5

## 2016-09-14 ENCOUNTER — Inpatient Hospital Stay: Payer: Medicaid Other | Attending: Hematology and Oncology

## 2016-09-14 ENCOUNTER — Other Ambulatory Visit: Payer: Self-pay | Admitting: Hematology and Oncology

## 2016-09-14 DIAGNOSIS — C7951 Secondary malignant neoplasm of bone: Secondary | ICD-10-CM | POA: Insufficient documentation

## 2016-09-14 DIAGNOSIS — G8929 Other chronic pain: Secondary | ICD-10-CM | POA: Diagnosis not present

## 2016-09-14 DIAGNOSIS — Z803 Family history of malignant neoplasm of breast: Secondary | ICD-10-CM | POA: Diagnosis not present

## 2016-09-14 DIAGNOSIS — M549 Dorsalgia, unspecified: Secondary | ICD-10-CM | POA: Insufficient documentation

## 2016-09-14 DIAGNOSIS — I739 Peripheral vascular disease, unspecified: Secondary | ICD-10-CM | POA: Insufficient documentation

## 2016-09-14 DIAGNOSIS — Z7689 Persons encountering health services in other specified circumstances: Secondary | ICD-10-CM | POA: Diagnosis not present

## 2016-09-14 DIAGNOSIS — Z800 Family history of malignant neoplasm of digestive organs: Secondary | ICD-10-CM | POA: Diagnosis not present

## 2016-09-14 DIAGNOSIS — F17210 Nicotine dependence, cigarettes, uncomplicated: Secondary | ICD-10-CM | POA: Diagnosis not present

## 2016-09-14 DIAGNOSIS — R5383 Other fatigue: Secondary | ICD-10-CM | POA: Diagnosis not present

## 2016-09-14 DIAGNOSIS — Z8701 Personal history of pneumonia (recurrent): Secondary | ICD-10-CM | POA: Insufficient documentation

## 2016-09-14 DIAGNOSIS — C778 Secondary and unspecified malignant neoplasm of lymph nodes of multiple regions: Secondary | ICD-10-CM | POA: Insufficient documentation

## 2016-09-14 DIAGNOSIS — I2510 Atherosclerotic heart disease of native coronary artery without angina pectoris: Secondary | ICD-10-CM | POA: Diagnosis not present

## 2016-09-14 DIAGNOSIS — Z86718 Personal history of other venous thrombosis and embolism: Secondary | ICD-10-CM | POA: Insufficient documentation

## 2016-09-14 DIAGNOSIS — Z5111 Encounter for antineoplastic chemotherapy: Secondary | ICD-10-CM | POA: Diagnosis not present

## 2016-09-14 DIAGNOSIS — E785 Hyperlipidemia, unspecified: Secondary | ICD-10-CM | POA: Diagnosis not present

## 2016-09-14 DIAGNOSIS — C3412 Malignant neoplasm of upper lobe, left bronchus or lung: Secondary | ICD-10-CM | POA: Diagnosis not present

## 2016-09-14 DIAGNOSIS — Z801 Family history of malignant neoplasm of trachea, bronchus and lung: Secondary | ICD-10-CM | POA: Diagnosis not present

## 2016-09-14 DIAGNOSIS — C3490 Malignant neoplasm of unspecified part of unspecified bronchus or lung: Secondary | ICD-10-CM

## 2016-09-14 MED ORDER — SODIUM CHLORIDE 0.9 % IV SOLN
100.0000 mg/m2 | Freq: Once | INTRAVENOUS | Status: AC
  Administered 2016-09-14: 190 mg via INTRAVENOUS
  Filled 2016-09-14: qty 9.5

## 2016-09-14 MED ORDER — PEGFILGRASTIM 6 MG/0.6ML ~~LOC~~ PSKT
6.0000 mg | PREFILLED_SYRINGE | Freq: Once | SUBCUTANEOUS | Status: AC
  Administered 2016-09-14: 6 mg via SUBCUTANEOUS
  Filled 2016-09-14: qty 0.6

## 2016-09-14 MED ORDER — HEPARIN SOD (PORK) LOCK FLUSH 100 UNIT/ML IV SOLN
INTRAVENOUS | Status: AC
  Filled 2016-09-14: qty 5

## 2016-09-14 MED ORDER — DEXAMETHASONE SODIUM PHOSPHATE 10 MG/ML IJ SOLN
10.0000 mg | Freq: Once | INTRAMUSCULAR | Status: AC
  Administered 2016-09-14: 10 mg via INTRAVENOUS
  Filled 2016-09-14: qty 1

## 2016-09-14 MED ORDER — SODIUM CHLORIDE 0.9 % IV SOLN: 10.0000 mg | Freq: Once | INTRAVENOUS | Status: DC

## 2016-09-14 MED ORDER — SODIUM CHLORIDE 0.9 % IV SOLN
Freq: Once | INTRAVENOUS | Status: AC
  Administered 2016-09-14: 14:00:00 via INTRAVENOUS
  Filled 2016-09-14: qty 1000

## 2016-09-14 MED ORDER — HEPARIN SOD (PORK) LOCK FLUSH 100 UNIT/ML IV SOLN
500.0000 [IU] | Freq: Once | INTRAVENOUS | Status: AC
  Administered 2016-09-14: 500 [IU] via INTRAVENOUS

## 2016-09-18 ENCOUNTER — Other Ambulatory Visit: Payer: Self-pay | Admitting: *Deleted

## 2016-09-18 ENCOUNTER — Inpatient Hospital Stay: Payer: Medicaid Other

## 2016-09-18 DIAGNOSIS — C7951 Secondary malignant neoplasm of bone: Secondary | ICD-10-CM

## 2016-09-18 DIAGNOSIS — Z5111 Encounter for antineoplastic chemotherapy: Secondary | ICD-10-CM | POA: Diagnosis not present

## 2016-09-18 DIAGNOSIS — C3490 Malignant neoplasm of unspecified part of unspecified bronchus or lung: Secondary | ICD-10-CM

## 2016-09-18 DIAGNOSIS — G893 Neoplasm related pain (acute) (chronic): Secondary | ICD-10-CM

## 2016-09-18 DIAGNOSIS — Z7189 Other specified counseling: Secondary | ICD-10-CM

## 2016-09-18 LAB — BASIC METABOLIC PANEL
Anion gap: 6 (ref 5–15)
BUN: 11 mg/dL (ref 6–20)
CO2: 24 mmol/L (ref 22–32)
Calcium: 8 mg/dL — ABNORMAL LOW (ref 8.9–10.3)
Chloride: 103 mmol/L (ref 101–111)
Creatinine, Ser: 0.67 mg/dL (ref 0.61–1.24)
GFR calc Af Amer: >60 mL/min (ref >60)
GFR calc non Af Amer: >60 mL/min (ref >60)
Glucose, Bld: 125 mg/dL — ABNORMAL HIGH (ref 65–99)
Potassium: 3.8 mmol/L (ref 3.5–5.1)
Sodium: 133 mmol/L — ABNORMAL LOW (ref 135–145)

## 2016-09-18 MED ORDER — OXYCODONE HCL 5 MG PO TABS: 5.0000 mg | ORAL_TABLET | ORAL | 0 refills | Status: DC | PRN

## 2016-09-18 NOTE — Progress Notes (Signed)
Calcium results today are 8.0 so he will not receive Xgeva.  He is taking OTC calcium 400+vit D 800 1 BID.  Dr. Mike Gip advise him to increase Ca+D to 1 TID, patient informed.

## 2016-09-26 ENCOUNTER — Telehealth: Payer: Self-pay | Admitting: *Deleted

## 2016-09-26 ENCOUNTER — Other Ambulatory Visit: Payer: Self-pay | Admitting: Hematology and Oncology

## 2016-09-26 ENCOUNTER — Inpatient Hospital Stay: Payer: Medicaid Other

## 2016-09-26 ENCOUNTER — Other Ambulatory Visit: Payer: Self-pay | Admitting: *Deleted

## 2016-09-26 DIAGNOSIS — D509 Iron deficiency anemia, unspecified: Secondary | ICD-10-CM

## 2016-09-26 DIAGNOSIS — Z7189 Other specified counseling: Secondary | ICD-10-CM

## 2016-09-26 DIAGNOSIS — C3490 Malignant neoplasm of unspecified part of unspecified bronchus or lung: Secondary | ICD-10-CM

## 2016-09-26 DIAGNOSIS — C7951 Secondary malignant neoplasm of bone: Secondary | ICD-10-CM

## 2016-09-26 DIAGNOSIS — Z5111 Encounter for antineoplastic chemotherapy: Secondary | ICD-10-CM

## 2016-09-26 DIAGNOSIS — D696 Thrombocytopenia, unspecified: Secondary | ICD-10-CM

## 2016-09-26 DIAGNOSIS — G893 Neoplasm related pain (acute) (chronic): Secondary | ICD-10-CM

## 2016-09-26 LAB — CBC WITH DIFFERENTIAL/PLATELET
Basophils Absolute: 0 10*3/uL (ref 0–0.1)
Basophils Relative: 1 %
Eosinophils Absolute: 0 10*3/uL (ref 0–0.7)
Eosinophils Relative: 0 %
HCT: 18 % — ABNORMAL LOW (ref 40.0–52.0)
Hemoglobin: 6.5 g/dL — ABNORMAL LOW (ref 13.0–18.0)
Lymphocytes Relative: 20 %
Lymphs Abs: 1.1 10*3/uL (ref 1.0–3.6)
MCH: 31.9 pg (ref 26.0–34.0)
MCHC: 36.2 g/dL — ABNORMAL HIGH (ref 32.0–36.0)
MCV: 88.1 fL (ref 80.0–100.0)
Monocytes Absolute: 0.4 10*3/uL (ref 0.2–1.0)
Monocytes Relative: 8 %
Neutro Abs: 3.8 10*3/uL (ref 1.4–6.5)
Neutrophils Relative %: 71 %
Platelets: 23 10*3/uL — CL (ref 150–400)
RBC: 2.05 MIL/uL — ABNORMAL LOW (ref 4.40–5.90)
RDW: 15.9 % — ABNORMAL HIGH (ref 11.5–14.5)
WBC: 5.3 10*3/uL (ref 3.8–10.6)

## 2016-09-26 LAB — PREPARE RBC (CROSSMATCH)

## 2016-09-26 MED ORDER — HEPARIN SOD (PORK) LOCK FLUSH 100 UNIT/ML IV SOLN
500.0000 [IU] | Freq: Every day | INTRAVENOUS | Status: AC | PRN
  Administered 2016-09-26: 500 [IU]
  Filled 2016-09-26: qty 5

## 2016-09-26 MED ORDER — ACETAMINOPHEN 325 MG PO TABS
650.0000 mg | ORAL_TABLET | Freq: Once | ORAL | Status: AC
  Administered 2016-09-26: 650 mg via ORAL
  Filled 2016-09-26: qty 2

## 2016-09-26 MED ORDER — DIPHENHYDRAMINE HCL 25 MG PO CAPS
25.0000 mg | ORAL_CAPSULE | Freq: Once | ORAL | Status: AC
  Administered 2016-09-26: 25 mg via ORAL
  Filled 2016-09-26: qty 1

## 2016-09-26 MED ORDER — SODIUM CHLORIDE 0.9 % IV SOLN
250.0000 mL | Freq: Once | INTRAVENOUS | Status: AC
  Administered 2016-09-26: 250 mL via INTRAVENOUS
  Filled 2016-09-26: qty 250

## 2016-09-26 NOTE — Telephone Encounter (Signed)
  CBC tomorrow to assess platelet count and possible transfusion.  M

## 2016-09-26 NOTE — Telephone Encounter (Signed)
Patient called and informed that he would need to come back to clinic today for blood transfusion r/t hgb 6.5 and plts of 23. Patient denied bleeding, pt voiced understanding, and is on the way back from home

## 2016-09-26 NOTE — Telephone Encounter (Signed)
Called patient and informed him that his hgb was 6.5 and he needed to come get a blood transfusion, voiced understanding and will return to clinic now, plts 23, patient denied bleeding.

## 2016-09-26 NOTE — Telephone Encounter (Signed)
Blood ordered patient on the way

## 2016-09-26 NOTE — Telephone Encounter (Signed)
Received a call with critical results for patient with plt 23 and hgb 6.5. I called [facility] and spoke with Rodena Piety and gave her the results. Read back performed and she stated she will notify Dr Mike Gip

## 2016-09-26 NOTE — Telephone Encounter (Signed)
Critical Lab - Hgb 6.5  MD notified.

## 2016-09-27 ENCOUNTER — Other Ambulatory Visit: Payer: Self-pay | Admitting: *Deleted

## 2016-09-27 ENCOUNTER — Other Ambulatory Visit: Payer: Self-pay

## 2016-09-27 ENCOUNTER — Inpatient Hospital Stay: Payer: Medicaid Other

## 2016-09-27 DIAGNOSIS — C3490 Malignant neoplasm of unspecified part of unspecified bronchus or lung: Secondary | ICD-10-CM

## 2016-09-27 DIAGNOSIS — G893 Neoplasm related pain (acute) (chronic): Secondary | ICD-10-CM

## 2016-09-27 DIAGNOSIS — D509 Iron deficiency anemia, unspecified: Secondary | ICD-10-CM

## 2016-09-27 DIAGNOSIS — C7951 Secondary malignant neoplasm of bone: Secondary | ICD-10-CM

## 2016-09-27 DIAGNOSIS — Z7189 Other specified counseling: Secondary | ICD-10-CM

## 2016-09-27 DIAGNOSIS — D696 Thrombocytopenia, unspecified: Secondary | ICD-10-CM

## 2016-09-27 DIAGNOSIS — Z5111 Encounter for antineoplastic chemotherapy: Secondary | ICD-10-CM

## 2016-09-27 LAB — CBC WITH DIFFERENTIAL/PLATELET
Basophils Absolute: 0 10*3/uL (ref 0–0.1)
Basophils Relative: 0 %
Eosinophils Absolute: 0 10*3/uL (ref 0–0.7)
Eosinophils Relative: 0 %
HCT: 24.7 % — ABNORMAL LOW (ref 40.0–52.0)
Hemoglobin: 8.9 g/dL — ABNORMAL LOW (ref 13.0–18.0)
Lymphocytes Relative: 14 %
Lymphs Abs: 1 10*3/uL (ref 1.0–3.6)
MCH: 31.2 pg (ref 26.0–34.0)
MCHC: 36.1 g/dL — ABNORMAL HIGH (ref 32.0–36.0)
MCV: 86.4 fL (ref 80.0–100.0)
Monocytes Absolute: 0.5 10*3/uL (ref 0.2–1.0)
Monocytes Relative: 7 %
Neutro Abs: 5.8 10*3/uL (ref 1.4–6.5)
Neutrophils Relative %: 79 %
Platelets: 31 10*3/uL — ABNORMAL LOW (ref 150–440)
RBC: 2.86 MIL/uL — ABNORMAL LOW (ref 4.40–5.90)
RDW: 15.9 % — ABNORMAL HIGH (ref 11.5–14.5)
WBC: 7.4 10*3/uL (ref 3.8–10.6)

## 2016-09-27 MED ORDER — OXYCODONE HCL ER 10 MG PO T12A: 10.0000 mg | EXTENDED_RELEASE_TABLET | Freq: Three times a day (TID) | ORAL | 0 refills | Status: DC | PRN

## 2016-09-27 MED ORDER — LORAZEPAM 0.5 MG PO TABS: 0.5000 mg | ORAL_TABLET | Freq: Four times a day (QID) | ORAL | 0 refills | Status: DC | PRN

## 2016-09-27 NOTE — Telephone Encounter (Signed)
This is ordered already

## 2016-09-29 LAB — BPAM RBC
Blood Product Expiration Date: 201806202359
Blood Product Expiration Date: 201806202359
ISSUE DATE / TIME: 201806131302
ISSUE DATE / TIME: 201806131449
Unit Type and Rh: 6200
Unit Type and Rh: 6200

## 2016-09-29 LAB — TYPE AND SCREEN
ABO/RH(D): A POS
Antibody Screen: NEGATIVE
Unit division: 0
Unit division: 0

## 2016-09-30 ENCOUNTER — Encounter: Payer: Self-pay | Admitting: Hematology and Oncology

## 2016-10-01 NOTE — Progress Notes (Signed)
[facility] day:  10/02/2016   Chief Complaint: Philip [name] is a 56 y.o. male with extensive stage small cell lung cancer who is seen for assessment prior to cycle #4 carboplatin and etoposide and prior to monthly Xgeva.  HPI:  The patient was last seen in the medical oncology clinic on 09/11/2016.  At that time, he felt "alright".  He was fatigued.  Calcium supplimentation was started.  Chest CT on 09/11/2016 revealed a marked response to therapy.  There was near complete resolution of left suprahilar mass and thoracic adenopathy.  There was interval sclerosis indicative of healing site of osseous metastasis.  There was no new or progressive disease.  He received cycle #3 carboplatin and etoposide from 05/30 - 09/14/2016.  He received OnPro Neulasta on 09/14/2016.    CBC on 09/26/2016 revealed a hematocrit of 18, hemoglobin 6.5, platelets 23,000, white count 5300 with an [place] of 3800.  He received 2 units of packed red blood cells on 09/26/2016. CBC on 6/14 revealed a hematocrit 24.7, hemoglobin 8.9 and platelets 31,000.  Symptomatically, he feels "okay". He states that his back always bothers him.   Past Medical History:  Diagnosis Date  . Anemia   . CAD (coronary artery disease)    a. 08/2014 Inf STEMI/CABG x 3 (LIMA->LAD, VG->Diag, RIMA->RCA).  . DVT, recurrent, lower extremity, acute, left ([place]) 2016  . Hyperlipidemia   . Hypertension   . Hypertensive heart disease   . Lung cancer ([place])   . PAD (peripheral artery disease) ([place])    a. 03/2015 Periph Angio: short occlusion of L pop w/ evidence of embolization into the DP->5.0x50 mm Inova self-expanding stent;  b. 04/08/2015 ABI: R 1.12, L 0.99.  . Tobacco abuse   . Toe amputation status, left [facility]) 07/09/2016   2017    Past Surgical History:  Procedure Laterality Date  . CARDIAC CATHETERIZATION N/A 08/24/2014   Procedure: Left Heart Cath and Coronary Angiography;  Surgeon: Isaias Cowman, MD;  Location: [facility];  Service: Cardiovascular;  Laterality: N/A;  . CARDIAC CATHETERIZATION N/A 08/24/2014   Procedure: Coronary Stent Intervention;  Surgeon: Isaias Cowman, MD;  Location: [facility];  Service: Cardiovascular;  Laterality: N/A;  . CORONARY ARTERY BYPASS GRAFT N/A 08/25/2014   Procedure: CORONARY ARTERY BYPASS GRAFTING (CABG), ON PUMP, TIMES THREE, USING BILATERAL MAMMARY ARTERIES, RIGHT GREATER SAPHENOUS VEIN HARVESTED ENDOSCOPICALLY;  Surgeon: Melrose Nakayama, MD;  Location: [facility];  Service: Open Heart Surgery;  Laterality: N/A;  . PERIPHERAL VASCULAR CATHETERIZATION N/A 03/30/2015   Procedure: Abdominal Aortogram w/Lower Extremity;  Surgeon: Wellington Hampshire, MD;  Location: [facility];  Service: Cardiovascular;  Laterality: N/A;  . PORTA CATH INSERTION N/A 07/27/2016   Procedure: Glori Luis Cath Insertion;  Surgeon: Algernon Huxley, MD;  Location: [facility];  Service: Cardiovascular;  Laterality: N/A;  . TEE WITHOUT CARDIOVERSION N/A 08/25/2014   Procedure: TRANSESOPHAGEAL ECHOCARDIOGRAM (TEE);  Surgeon: Melrose Nakayama, MD;  Location: [facility];  Service: Open Heart Surgery;  Laterality: N/A;    Family History  Problem Relation Age of Onset  . Hypertension Father   . Lung cancer Father   . Heart attack Mother 51       STENT  . Hyperlipidemia Mother   . Heart attack Brother   . Heart attack Brother   . Breast cancer Sister   . Heart attack Sister   . Colon cancer Paternal Grandfather  Social History:  reports that he has been smoking Cigarettes.  He has been smoking about 0.50 packs per day. He has never used smokeless tobacco. He reports that he drinks alcohol. He reports that he does not use drugs.  The patient smokes 1/2 pack of cigarettes/day (previously 2 1/2 ppd).  He started smoking in his teens.  He denies any exposure to radiation or toxins.  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  He  lives in [place].  His wife is named Publishing copy.  He is accompanied by his wife today.  Allergies:  Allergies  Allergen Reactions  . Amitriptyline Other (See Comments)    Burning sensation all over  . Contrast Media [Iodinated Diagnostic Agents] Other (See Comments)    Pt states that he got "knots behind his ears".  . Nsaids Other (See Comments)    Other reaction(s): Other (See Comments) Reaction:  Blood in stool  Reaction:  Blood in stool   . Ibuprofen Other (See Comments) and Nausea Only    Blood in stools    Current Medications: Current Outpatient Prescriptions  Medication Sig Dispense Refill  . atorvastatin (LIPITOR) 40 MG tablet TAKE 1 TABLET BY MOUTH DAILY 90 tablet 3  . chlorpheniramine-HYDROcodone (TUSSIONEX PENNKINETIC ER) 10-8 MG/5ML SUER Take 5 mLs by mouth every 12 (twelve) hours as needed for cough. 140 mL 0  . clopidogrel (PLAVIX) 75 MG tablet Take 75 mg by mouth daily.    . diphenhydrAMINE (BENADRYL) 25 mg capsule Take 25 mg by mouth 2 (two) times daily as needed for allergies.     Marland Kitchen lidocaine-prilocaine (EMLA) cream Apply to affected area once 30 g 3  . lisinopril (PRINIVIL,ZESTRIL) 5 MG tablet TAKE 1 TABLET BY MOUTH DAILY 90 tablet 1  . LORazepam (ATIVAN) 0.5 MG tablet Take 1 tablet (0.5 mg total) by mouth every 6 (six) hours as needed (Nausea or vomiting). 30 tablet 0  . metoprolol tartrate (LOPRESSOR) 25 MG tablet Take 1 tablet (25 mg total) by mouth 2 (two) times daily. 180 tablet 3  . omeprazole (PRILOSEC) 20 MG capsule Take 20 mg by mouth daily.    . ondansetron (ZOFRAN) 8 MG tablet Take 1 tablet (8 mg total) by mouth 2 (two) times daily as needed for refractory nausea / vomiting. Start on day 3 after carboplatin chemo. 30 tablet 1  . oxyCODONE (OXY IR/ROXICODONE) 5 MG immediate release tablet Take 1 tablet (5 mg total) by mouth every 4 (four) hours as needed for severe pain. 60 tablet 0  . oxyCODONE (OXYCONTIN) 10 mg 12 hr tablet Take 1 tablet (10 mg total) by  mouth every 8 (eight) hours as needed. 90 tablet 0  . promethazine (PHENERGAN) 25 MG suppository Place 1 suppository (25 mg total) rectally every 6 (six) hours as needed for nausea or vomiting. 12 each 5   Current Facility-Administered Medications  Medication Dose Route Frequency Provider Last Rate Last Dose  . ondansetron (ZOFRAN-ODT) disintegrating tablet 8 mg  8 mg Oral Once Lloyd Huger, MD        Review of Systems:  GENERAL:  Fatigue. No fevers or sweats.  Weight down 2 pounds in 3 weeks. PERFORMANCE STATUS (ECOG):  1 HEENT:  No visual changes, runny nose, sore throat, mouth sores or tenderness. Lungs: No shortness of breath or cough.  No hemoptysis. Cardiac:  No chest pain, palpitations, orthopnea, or PND. GI:  Appetite 75%.  No diarrhea, constipation, melena or hematochezia.  Colonoscopy 30 years  ago.   GU:  No urgency, frequency, dysuria, or hematuria. Musculoskeletal:  No back pain.  No joint pain.  No muscle tenderness. Extremities:  No pain or swelling. Left toe removed (old). Skin:  No rashes or skin changes. Neuro:  No headache, numbness or weakness, balance or coordination issues. Endocrine:  No diabetes, thyroid issues, hot flashes or night sweats. Psych:  No mood changes, depression or anxiety. Pain:  No pain. Review of systems:  All other systems reviewed and found to be negative.  Physical Exam: Pulse 82, temperature 97.6 F (36.4 C), temperature source Tympanic, resp. rate 18, weight 146 lb 3 oz (66.3 kg). GENERAL:  Well developed, well nourished, gentleman sitting comfortably in the exam room in no acute distress. MENTAL STATUS: Alert and oriented to person, place and time. HEAD:Alopecia.  Normocephalic, atraumatic, face symmetric, no Cushingoid features. EYES:[place]. No scleral hemorrhage.  Pupils equal round and reactive to light and accomodation. No conjunctivitis or scleral icterus. XQJ:JHERDEYCXK clear without lesion. Tonguenormal. Poor  dentition. Mucous membranes moist. RESPIRATORY:Clear to auscultationwithout rales, wheezes or rhonchi. CARDIOVASCULAR:Regular rate andrhythmwithout murmur, rub or gallop. ABDOMEN:Soft, non-tender, with active bowel sounds, and no hepatosplenomegaly. No masses. BACK:  Pain on palpation in mid thoracic area. SKIN: No rashes, ulcers or lesions. EXTREMITIES: No lower extremity edema, no skin discoloration or tenderness. No palpable cords. LYMPHNODES: No palpable cervical, supraclavicular, axillary or inguinal adenopathy  NEUROLOGICAL: Unremarkable. PSYCH: Appropriate.   Appointment on 10/02/2016  Component Date Value Ref Range Status  . Sodium 10/02/2016 137  135 - 145 mmol/L Final  . Potassium 10/02/2016 3.8  3.5 - 5.1 mmol/L Final  . Chloride 10/02/2016 106  101 - 111 mmol/L Final  . CO2 10/02/2016 24  22 - 32 mmol/L Final  . Glucose, Bld 10/02/2016 132* 65 - 99 mg/dL Final  . BUN 10/02/2016 6  6 - 20 mg/dL Final  . Creatinine, Ser 10/02/2016 0.81  0.61 - 1.24 mg/dL Final  . Calcium 10/02/2016 8.2* 8.9 - 10.3 mg/dL Final  . Total Protein 10/02/2016 6.7  6.5 - 8.1 g/dL Final  . Albumin 10/02/2016 3.4* 3.5 - 5.0 g/dL Final  . AST 10/02/2016 21  15 - 41 U/L Final  . ALT 10/02/2016 13* 17 - 63 U/L Final  . Alkaline Phosphatase 10/02/2016 101  38 - 126 U/L Final  . Total Bilirubin 10/02/2016 0.5  0.3 - 1.2 mg/dL Final  . GFR calc non Af Amer 10/02/2016 >60  >60 mL/min Final  . GFR calc Af Amer 10/02/2016 >60  >60 mL/min Final   Comment: (NOTE) The eGFR has been calculated using the CKD EPI equation. This calculation has not been validated in all clinical situations. eGFR's persistently <60 mL/min signify possible Chronic Kidney Disease.   . Anion gap 10/02/2016 7  5 - 15 Final  . WBC 10/02/2016 8.1  3.8 - 10.6 K/uL Final  . RBC 10/02/2016 2.97* 4.40 - 5.90 MIL/uL Final  . Hemoglobin 10/02/2016 9.3* 13.0 - 18.0 g/dL Final  . HCT 10/02/2016 26.3* 40.0 - 52.0 % Final  .  MCV 10/02/2016 88.6  80.0 - 100.0 fL Final  . MCH 10/02/2016 31.4  26.0 - 34.0 pg Final  . MCHC 10/02/2016 35.4  32.0 - 36.0 g/dL Final  . RDW 10/02/2016 16.7* 11.5 - 14.5 % Final  . Platelets 10/02/2016 169  150 - 440 K/uL Final  . Neutrophils Relative % 10/02/2016 82  % Final  . Neutro Abs 10/02/2016 6.7* 1.4 - 6.5 K/uL Final  .  Lymphocytes Relative 10/02/2016 12  % Final  . Lymphs Abs 10/02/2016 0.9* 1.0 - 3.6 K/uL Final  . Monocytes Relative 10/02/2016 6  % Final  . Monocytes Absolute 10/02/2016 0.5  0.2 - 1.0 K/uL Final  . Eosinophils Relative 10/02/2016 0  % Final  . Eosinophils Absolute 10/02/2016 0.0  0 - 0.7 K/uL Final  . Basophils Relative 10/02/2016 0  % Final  . Basophils Absolute 10/02/2016 0.0  0 - 0.1 K/uL Final  . Magnesium 10/02/2016 1.4* 1.7 - 2.4 mg/dL Final    Assessment:  Philip [name] is a 56 y.o. male with extensive stage small cell lung cancer.  He presented with a 2 year history of night sweats and a 36 pound weight loss in 6 months.  Right supraclavicular biopsy on 07/19/2016 revealed small cell carcinoma of the lung.  CEA was 1.7 on 07/31/2016.  Chest CT on 06/28/2016 revealed extensive adenopathy.  There were multiple lymph nodes descending thoracic aorta, largest measuring 2.1 x 1.7 cm.  There was adenopathy in the aortopulmonary window with the largest confluence of lymph nodes measuring 4 x 2.6 cm. There was hilar adenopathy on the left measuring 2 x 1.9 cm. There were multiple enlarged lymph nodes in the pretracheal region, largest 1.7 x 1.7 cm.  There were lymph nodes to the left of the carina measuring 2.8 x 1.9 cm. There was a subcarinal lymph node measuring 2.4 x 1.6 cm. There was a 4 x 3 mm nodular opacity in the anterior segment of the left upper lobe.    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  There was nodal metastasis within the chest and supraclavicular/low jugular regions.  There was  multifocal osseous metastasis.  There was possibly a pathologic fracture involving the posteromedial right tenth rib.  There was new left lower lobe opacity with mild hypermetabolism, suspicious for infection or postobstructive pneumonitis.  He completed a course of Levaquin for apost-obstructive pneumonia.  Head MRI on 07/26/2016 revealed no evidence of brain metastasis. There was punctate DWI hyperintensity along a high right frontal gyrus, possible recent infarct.  There was a small occipital bone metastasis.  Head MRA on 07/27/2016 revealed normal large and medium sized vessels.  Bilateral carotid duplex on 07/27/2016 revealed <50% stenosis in the right and left internal carotis arteries.  Echo on 07/27/2016 revealed an EF of 45-50% without cardiac source of emboli.  He is s/p 3 cycles of carboplatin and etoposide (07/31/2016 - 09/12/2016) with OnPro Neulasta support.  Platelet nadir was 40,000 on day 16 of cycle #2 and 23,000 on day 15 of cycle #3.  He received 2 units of PRBC with cycle #3.  He received Xgeva on 08/13/2016.  Chest CT on 09/11/2016 revealed a marked response to therapy.  There was near complete resolution of left suprahilar mass and thoracic adenopathy.  There was interval sclerosis indicative of healing site of osseous metastasis.  He has a history of left lower extremity DVT a few months after his cardiac surgery in 08/2014.  He has peripheral vascular disease s/p stent placement in 03/2015.  He is on a Plavix.  He has not had a colonoscopy in 30 years.  Symptomatically, he has chronic pain in the mid thoracic region.  Exam is unremarkable.  Corrected calcium is 8.68 (low).  Plan: 1.  Labs today:  CBC with diff, CMP, Mg. 2.  Thoracic spine MRI. 3.  Day 1 of cycle #4 carboplatin and etoposide today.  Dose  reduction secondary to myelosuppression. 4.  RTC on 06/20 and 06/21 for etoposide. 5.  RTC on 06/21 for OnPro Neulasta. 6.  Schedule PET scan on 10/22/2016. 7.  Postpone  Xgeva.  Discuss calcium supplementation. 8.  RTC on 10/15/2016 for labs (CBC with diff). 9.  RTC on 10/23/2016 for MD assessment, labs (CBC with diff, CMP, Mg), review of PET scan, and +/- cycle #5 carboplatin and etoposide.   Lequita Asal, MD  10/02/2016, 11:06 AM

## 2016-10-02 ENCOUNTER — Inpatient Hospital Stay: Payer: Medicaid Other

## 2016-10-02 ENCOUNTER — Inpatient Hospital Stay (HOSPITAL_BASED_OUTPATIENT_CLINIC_OR_DEPARTMENT_OTHER): Payer: Medicaid Other | Admitting: Hematology and Oncology

## 2016-10-02 VITALS — HR 82 | Temp 97.6°F | Resp 18 | Wt 146.2 lb

## 2016-10-02 DIAGNOSIS — G8929 Other chronic pain: Secondary | ICD-10-CM | POA: Diagnosis not present

## 2016-10-02 DIAGNOSIS — M549 Dorsalgia, unspecified: Secondary | ICD-10-CM

## 2016-10-02 DIAGNOSIS — Z86718 Personal history of other venous thrombosis and embolism: Secondary | ICD-10-CM

## 2016-10-02 DIAGNOSIS — C3490 Malignant neoplasm of unspecified part of unspecified bronchus or lung: Secondary | ICD-10-CM

## 2016-10-02 DIAGNOSIS — Z8701 Personal history of pneumonia (recurrent): Secondary | ICD-10-CM

## 2016-10-02 DIAGNOSIS — R5383 Other fatigue: Secondary | ICD-10-CM

## 2016-10-02 DIAGNOSIS — I739 Peripheral vascular disease, unspecified: Secondary | ICD-10-CM

## 2016-10-02 DIAGNOSIS — C7951 Secondary malignant neoplasm of bone: Secondary | ICD-10-CM | POA: Diagnosis not present

## 2016-10-02 DIAGNOSIS — Z5111 Encounter for antineoplastic chemotherapy: Secondary | ICD-10-CM | POA: Diagnosis not present

## 2016-10-02 DIAGNOSIS — Z803 Family history of malignant neoplasm of breast: Secondary | ICD-10-CM

## 2016-10-02 DIAGNOSIS — M546 Pain in thoracic spine: Secondary | ICD-10-CM

## 2016-10-02 DIAGNOSIS — C3412 Malignant neoplasm of upper lobe, left bronchus or lung: Secondary | ICD-10-CM

## 2016-10-02 DIAGNOSIS — Z801 Family history of malignant neoplasm of trachea, bronchus and lung: Secondary | ICD-10-CM

## 2016-10-02 DIAGNOSIS — Z7689 Persons encountering health services in other specified circumstances: Secondary | ICD-10-CM

## 2016-10-02 DIAGNOSIS — G893 Neoplasm related pain (acute) (chronic): Secondary | ICD-10-CM

## 2016-10-02 DIAGNOSIS — I2510 Atherosclerotic heart disease of native coronary artery without angina pectoris: Secondary | ICD-10-CM

## 2016-10-02 DIAGNOSIS — C778 Secondary and unspecified malignant neoplasm of lymph nodes of multiple regions: Secondary | ICD-10-CM | POA: Diagnosis not present

## 2016-10-02 DIAGNOSIS — Z800 Family history of malignant neoplasm of digestive organs: Secondary | ICD-10-CM

## 2016-10-02 DIAGNOSIS — E785 Hyperlipidemia, unspecified: Secondary | ICD-10-CM

## 2016-10-02 DIAGNOSIS — Z7189 Other specified counseling: Secondary | ICD-10-CM

## 2016-10-02 DIAGNOSIS — F17210 Nicotine dependence, cigarettes, uncomplicated: Secondary | ICD-10-CM

## 2016-10-02 LAB — COMPREHENSIVE METABOLIC PANEL
ALT: 13 U/L — ABNORMAL LOW (ref 17–63)
AST: 21 U/L (ref 15–41)
Albumin: 3.4 g/dL — ABNORMAL LOW (ref 3.5–5.0)
Alkaline Phosphatase: 101 U/L (ref 38–126)
Anion gap: 7 (ref 5–15)
BUN: 6 mg/dL (ref 6–20)
CO2: 24 mmol/L (ref 22–32)
Calcium: 8.2 mg/dL — ABNORMAL LOW (ref 8.9–10.3)
Chloride: 106 mmol/L (ref 101–111)
Creatinine, Ser: 0.81 mg/dL (ref 0.61–1.24)
GFR calc Af Amer: >60 mL/min (ref >60)
GFR calc non Af Amer: >60 mL/min (ref >60)
Glucose, Bld: 132 mg/dL — ABNORMAL HIGH (ref 65–99)
Potassium: 3.8 mmol/L (ref 3.5–5.1)
Sodium: 137 mmol/L (ref 135–145)
Total Bilirubin: 0.5 mg/dL (ref 0.3–1.2)
Total Protein: 6.7 g/dL (ref 6.5–8.1)

## 2016-10-02 LAB — CBC WITH DIFFERENTIAL/PLATELET
Basophils Absolute: 0 10*3/uL (ref 0–0.1)
Basophils Relative: 0 %
Eosinophils Absolute: 0 10*3/uL (ref 0–0.7)
Eosinophils Relative: 0 %
HCT: 26.3 % — ABNORMAL LOW (ref 40.0–52.0)
Hemoglobin: 9.3 g/dL — ABNORMAL LOW (ref 13.0–18.0)
Lymphocytes Relative: 12 %
Lymphs Abs: 0.9 10*3/uL — ABNORMAL LOW (ref 1.0–3.6)
MCH: 31.4 pg (ref 26.0–34.0)
MCHC: 35.4 g/dL (ref 32.0–36.0)
MCV: 88.6 fL (ref 80.0–100.0)
Monocytes Absolute: 0.5 10*3/uL (ref 0.2–1.0)
Monocytes Relative: 6 %
Neutro Abs: 6.7 10*3/uL — ABNORMAL HIGH (ref 1.4–6.5)
Neutrophils Relative %: 82 %
Platelets: 169 10*3/uL (ref 150–440)
RBC: 2.97 MIL/uL — ABNORMAL LOW (ref 4.40–5.90)
RDW: 16.7 % — ABNORMAL HIGH (ref 11.5–14.5)
WBC: 8.1 10*3/uL (ref 3.8–10.6)

## 2016-10-02 LAB — MAGNESIUM: Magnesium: 1.4 mg/dL — ABNORMAL LOW (ref 1.7–2.4)

## 2016-10-02 MED ORDER — SODIUM CHLORIDE 0.9 % IV SOLN
80.0000 mg/m2 | Freq: Once | INTRAVENOUS | Status: AC
  Administered 2016-10-02: 150 mg via INTRAVENOUS
  Filled 2016-10-02: qty 7.5

## 2016-10-02 MED ORDER — DEXAMETHASONE SODIUM PHOSPHATE 10 MG/ML IJ SOLN
10.0000 mg | Freq: Once | INTRAMUSCULAR | Status: AC
  Administered 2016-10-02: 10 mg via INTRAVENOUS
  Filled 2016-10-02: qty 1

## 2016-10-02 MED ORDER — PALONOSETRON HCL INJECTION 0.25 MG/5ML
0.2500 mg | Freq: Once | INTRAVENOUS | Status: AC
  Administered 2016-10-02: 0.25 mg via INTRAVENOUS
  Filled 2016-10-02: qty 5

## 2016-10-02 MED ORDER — SODIUM CHLORIDE 0.9 % IV SOLN
630.0000 mg | Freq: Once | INTRAVENOUS | Status: AC
  Administered 2016-10-02: 630 mg via INTRAVENOUS
  Filled 2016-10-02: qty 63

## 2016-10-02 MED ORDER — OXYCODONE HCL 5 MG PO TABS: 5.0000 mg | ORAL_TABLET | ORAL | 0 refills | Status: DC | PRN

## 2016-10-02 MED ORDER — SODIUM CHLORIDE 0.9 % IV SOLN
Freq: Once | INTRAVENOUS | Status: AC
  Administered 2016-10-02: 12:00:00 via INTRAVENOUS
  Filled 2016-10-02: qty 1000

## 2016-10-02 MED ORDER — HEPARIN SOD (PORK) LOCK FLUSH 100 UNIT/ML IV SOLN
500.0000 [IU] | Freq: Once | INTRAVENOUS | Status: AC | PRN
  Administered 2016-10-02: 500 [IU]
  Filled 2016-10-02: qty 5

## 2016-10-02 MED ORDER — PROMETHAZINE HCL 25 MG RE SUPP: 25.0000 mg | Freq: Four times a day (QID) | RECTAL | 5 refills | Status: DC | PRN

## 2016-10-02 NOTE — Progress Notes (Signed)
Patient requesting refills for Oxycodone 5 mg and Phenergan suppositories.  Offers no complaints today.

## 2016-10-03 ENCOUNTER — Inpatient Hospital Stay: Payer: Medicaid Other

## 2016-10-03 VITALS — BP 149/77 | HR 85 | Temp 97.8°F | Resp 20

## 2016-10-03 DIAGNOSIS — Z5111 Encounter for antineoplastic chemotherapy: Secondary | ICD-10-CM | POA: Diagnosis not present

## 2016-10-03 DIAGNOSIS — C3490 Malignant neoplasm of unspecified part of unspecified bronchus or lung: Secondary | ICD-10-CM

## 2016-10-03 MED ORDER — ETOPOSIDE CHEMO INJECTION 1 GM/50ML
80.0000 mg/m2 | Freq: Once | INTRAVENOUS | Status: AC
  Administered 2016-10-03: 150 mg via INTRAVENOUS
  Filled 2016-10-03: qty 7.5

## 2016-10-03 MED ORDER — HEPARIN SOD (PORK) LOCK FLUSH 100 UNIT/ML IV SOLN
500.0000 [IU] | Freq: Once | INTRAVENOUS | Status: AC | PRN
  Administered 2016-10-03: 500 [IU]

## 2016-10-03 MED ORDER — SODIUM CHLORIDE 0.9 % IV SOLN
Freq: Once | INTRAVENOUS | Status: AC
  Administered 2016-10-03: 13:00:00 via INTRAVENOUS
  Filled 2016-10-03: qty 1000

## 2016-10-03 MED ORDER — HEPARIN SOD (PORK) LOCK FLUSH 100 UNIT/ML IV SOLN
INTRAVENOUS | Status: AC
  Filled 2016-10-03: qty 5

## 2016-10-03 MED ORDER — DEXAMETHASONE SODIUM PHOSPHATE 10 MG/ML IJ SOLN
10.0000 mg | Freq: Once | INTRAMUSCULAR | Status: AC
  Administered 2016-10-03: 10 mg via INTRAVENOUS
  Filled 2016-10-03: qty 1

## 2016-10-04 ENCOUNTER — Inpatient Hospital Stay: Payer: Medicaid Other

## 2016-10-04 VITALS — BP 133/75 | HR 80 | Temp 97.0°F | Resp 18

## 2016-10-04 DIAGNOSIS — C3490 Malignant neoplasm of unspecified part of unspecified bronchus or lung: Secondary | ICD-10-CM

## 2016-10-04 DIAGNOSIS — Z5111 Encounter for antineoplastic chemotherapy: Secondary | ICD-10-CM | POA: Diagnosis not present

## 2016-10-04 MED ORDER — SODIUM CHLORIDE 0.9 % IV SOLN
80.0000 mg/m2 | Freq: Once | INTRAVENOUS | Status: AC
  Administered 2016-10-04: 150 mg via INTRAVENOUS
  Filled 2016-10-04: qty 7.5

## 2016-10-04 MED ORDER — HEPARIN SOD (PORK) LOCK FLUSH 100 UNIT/ML IV SOLN
500.0000 [IU] | Freq: Once | INTRAVENOUS | Status: AC | PRN
  Administered 2016-10-04: 500 [IU]

## 2016-10-04 MED ORDER — SODIUM CHLORIDE 0.9 % IV SOLN
Freq: Once | INTRAVENOUS | Status: AC
  Administered 2016-10-04: 14:00:00 via INTRAVENOUS
  Filled 2016-10-04: qty 1000

## 2016-10-04 MED ORDER — DEXAMETHASONE SODIUM PHOSPHATE 10 MG/ML IJ SOLN
10.0000 mg | Freq: Once | INTRAMUSCULAR | Status: AC
  Administered 2016-10-04: 10 mg via INTRAVENOUS
  Filled 2016-10-04: qty 1

## 2016-10-04 MED ORDER — PEGFILGRASTIM 6 MG/0.6ML ~~LOC~~ PSKT
6.0000 mg | PREFILLED_SYRINGE | Freq: Once | SUBCUTANEOUS | Status: AC
  Administered 2016-10-04: 6 mg via SUBCUTANEOUS
  Filled 2016-10-04: qty 0.6

## 2016-10-04 MED ORDER — HEPARIN SOD (PORK) LOCK FLUSH 100 UNIT/ML IV SOLN
INTRAVENOUS | Status: AC
Start: 2016-10-04 — End: 2016-10-04
  Filled 2016-10-04: qty 5

## 2016-10-15 ENCOUNTER — Inpatient Hospital Stay: Payer: Medicaid Other | Attending: Oncology

## 2016-10-15 ENCOUNTER — Other Ambulatory Visit: Payer: Self-pay | Admitting: Oncology

## 2016-10-15 ENCOUNTER — Telehealth: Payer: Self-pay | Admitting: *Deleted

## 2016-10-15 ENCOUNTER — Inpatient Hospital Stay: Payer: Medicaid Other

## 2016-10-15 DIAGNOSIS — D6481 Anemia due to antineoplastic chemotherapy: Secondary | ICD-10-CM | POA: Diagnosis not present

## 2016-10-15 DIAGNOSIS — M5134 Other intervertebral disc degeneration, thoracic region: Secondary | ICD-10-CM | POA: Insufficient documentation

## 2016-10-15 DIAGNOSIS — E785 Hyperlipidemia, unspecified: Secondary | ICD-10-CM | POA: Diagnosis not present

## 2016-10-15 DIAGNOSIS — F17210 Nicotine dependence, cigarettes, uncomplicated: Secondary | ICD-10-CM | POA: Diagnosis not present

## 2016-10-15 DIAGNOSIS — Z9221 Personal history of antineoplastic chemotherapy: Secondary | ICD-10-CM | POA: Insufficient documentation

## 2016-10-15 DIAGNOSIS — D649 Anemia, unspecified: Secondary | ICD-10-CM

## 2016-10-15 DIAGNOSIS — G8929 Other chronic pain: Secondary | ICD-10-CM | POA: Diagnosis not present

## 2016-10-15 DIAGNOSIS — R590 Localized enlarged lymph nodes: Secondary | ICD-10-CM | POA: Diagnosis not present

## 2016-10-15 DIAGNOSIS — Z86718 Personal history of other venous thrombosis and embolism: Secondary | ICD-10-CM | POA: Insufficient documentation

## 2016-10-15 DIAGNOSIS — C3490 Malignant neoplasm of unspecified part of unspecified bronchus or lung: Secondary | ICD-10-CM

## 2016-10-15 DIAGNOSIS — R5383 Other fatigue: Secondary | ICD-10-CM | POA: Insufficient documentation

## 2016-10-15 DIAGNOSIS — C7951 Secondary malignant neoplasm of bone: Secondary | ICD-10-CM | POA: Diagnosis not present

## 2016-10-15 DIAGNOSIS — D509 Iron deficiency anemia, unspecified: Secondary | ICD-10-CM

## 2016-10-15 DIAGNOSIS — Z801 Family history of malignant neoplasm of trachea, bronchus and lung: Secondary | ICD-10-CM | POA: Diagnosis not present

## 2016-10-15 DIAGNOSIS — I10 Essential (primary) hypertension: Secondary | ICD-10-CM | POA: Insufficient documentation

## 2016-10-15 DIAGNOSIS — M549 Dorsalgia, unspecified: Secondary | ICD-10-CM | POA: Insufficient documentation

## 2016-10-15 DIAGNOSIS — Z8781 Personal history of (healed) traumatic fracture: Secondary | ICD-10-CM | POA: Diagnosis not present

## 2016-10-15 DIAGNOSIS — I739 Peripheral vascular disease, unspecified: Secondary | ICD-10-CM | POA: Insufficient documentation

## 2016-10-15 DIAGNOSIS — I2510 Atherosclerotic heart disease of native coronary artery without angina pectoris: Secondary | ICD-10-CM | POA: Insufficient documentation

## 2016-10-15 DIAGNOSIS — Z803 Family history of malignant neoplasm of breast: Secondary | ICD-10-CM | POA: Diagnosis not present

## 2016-10-15 DIAGNOSIS — C3412 Malignant neoplasm of upper lobe, left bronchus or lung: Secondary | ICD-10-CM | POA: Insufficient documentation

## 2016-10-15 DIAGNOSIS — Z800 Family history of malignant neoplasm of digestive organs: Secondary | ICD-10-CM | POA: Insufficient documentation

## 2016-10-15 DIAGNOSIS — Z79899 Other long term (current) drug therapy: Secondary | ICD-10-CM | POA: Diagnosis not present

## 2016-10-15 LAB — CBC WITH DIFFERENTIAL/PLATELET
Basophils Absolute: 0 10*3/uL (ref 0–0.1)
Basophils Relative: 1 %
Eosinophils Absolute: 0 10*3/uL (ref 0–0.7)
Eosinophils Relative: 0 %
HCT: 20.5 % — ABNORMAL LOW (ref 40.0–52.0)
Hemoglobin: 7.3 g/dL — ABNORMAL LOW (ref 13.0–18.0)
Lymphocytes Relative: 27 %
Lymphs Abs: 1 10*3/uL (ref 1.0–3.6)
MCH: 32.1 pg (ref 26.0–34.0)
MCHC: 35.6 g/dL (ref 32.0–36.0)
MCV: 90.2 fL (ref 80.0–100.0)
Monocytes Absolute: 0.3 10*3/uL (ref 0.2–1.0)
Monocytes Relative: 9 %
Neutro Abs: 2.3 10*3/uL (ref 1.4–6.5)
Neutrophils Relative %: 63 %
Platelets: 47 10*3/uL — ABNORMAL LOW (ref 150–440)
RBC: 2.27 MIL/uL — ABNORMAL LOW (ref 4.40–5.90)
RDW: 20.7 % — ABNORMAL HIGH (ref 11.5–14.5)
WBC: 3.6 10*3/uL — ABNORMAL LOW (ref 3.8–10.6)

## 2016-10-15 LAB — PREPARE RBC (CROSSMATCH)

## 2016-10-15 MED ORDER — SODIUM CHLORIDE 0.9% FLUSH
10.0000 mL | INTRAVENOUS | Status: AC | PRN
  Administered 2016-10-15: 10 mL
  Filled 2016-10-15: qty 10

## 2016-10-15 MED ORDER — SODIUM CHLORIDE 0.9 % IV SOLN
250.0000 mL | Freq: Once | INTRAVENOUS | Status: AC
  Administered 2016-10-15: 250 mL via INTRAVENOUS
  Filled 2016-10-15: qty 250

## 2016-10-15 MED ORDER — DIPHENHYDRAMINE HCL 50 MG/ML IJ SOLN
25.0000 mg | Freq: Once | INTRAMUSCULAR | Status: AC
  Administered 2016-10-15: 25 mg via INTRAVENOUS

## 2016-10-15 MED ORDER — ACETAMINOPHEN 325 MG PO TABS
650.0000 mg | ORAL_TABLET | Freq: Once | ORAL | Status: AC
  Administered 2016-10-15: 650 mg via ORAL

## 2016-10-15 MED ORDER — HEPARIN SOD (PORK) LOCK FLUSH 100 UNIT/ML IV SOLN
500.0000 [IU] | Freq: Every day | INTRAVENOUS | Status: AC | PRN
  Administered 2016-10-15: 500 [IU]

## 2016-10-15 NOTE — Telephone Encounter (Signed)
Called patient and informed him that his hgb was low and that we needed to give him a unit of prbc's voiced understanding, patient will return today for transfusion.  Patient instructed related to bleeding precautions voiced understanding.

## 2016-10-16 LAB — TYPE AND SCREEN
ABO/RH(D): A POS
Antibody Screen: NEGATIVE
Unit division: 0

## 2016-10-16 LAB — BPAM RBC
BLOOD PRODUCT EXPIRATION DATE: 201807082359
ISSUE DATE / TIME: 201807021459
Unit Type and Rh: 6200

## 2016-10-22 ENCOUNTER — Ambulatory Visit: Payer: Medicaid Other

## 2016-10-22 ENCOUNTER — Ambulatory Visit
Admission: RE | Admit: 2016-10-22 | Discharge: 2016-10-22 | Disposition: A | Discharge status: Home / Self Care | Payer: Medicaid Other | Source: Ambulatory Visit | Attending: Hematology and Oncology | Admitting: Hematology and Oncology

## 2016-10-22 DIAGNOSIS — C3490 Malignant neoplasm of unspecified part of unspecified bronchus or lung: Secondary | ICD-10-CM | POA: Diagnosis not present

## 2016-10-22 DIAGNOSIS — M5134 Other intervertebral disc degeneration, thoracic region: Secondary | ICD-10-CM | POA: Diagnosis not present

## 2016-10-22 DIAGNOSIS — C7951 Secondary malignant neoplasm of bone: Secondary | ICD-10-CM | POA: Insufficient documentation

## 2016-10-22 MED ORDER — GADOBENATE DIMEGLUMINE 529 MG/ML IV SOLN
15.0000 mL | Freq: Once | INTRAVENOUS | Status: AC | PRN
  Administered 2016-10-22: 13 mL via INTRAVENOUS

## 2016-10-23 ENCOUNTER — Inpatient Hospital Stay: Payer: Medicaid Other

## 2016-10-23 ENCOUNTER — Inpatient Hospital Stay (HOSPITAL_BASED_OUTPATIENT_CLINIC_OR_DEPARTMENT_OTHER): Payer: Medicaid Other | Admitting: Hematology and Oncology

## 2016-10-23 ENCOUNTER — Telehealth: Payer: Self-pay | Admitting: *Deleted

## 2016-10-23 VITALS — BP 134/81 | HR 78 | Temp 97.9°F | Resp 18 | Wt 152.0 lb

## 2016-10-23 DIAGNOSIS — R5383 Other fatigue: Secondary | ICD-10-CM

## 2016-10-23 DIAGNOSIS — M549 Dorsalgia, unspecified: Secondary | ICD-10-CM

## 2016-10-23 DIAGNOSIS — I10 Essential (primary) hypertension: Secondary | ICD-10-CM

## 2016-10-23 DIAGNOSIS — R590 Localized enlarged lymph nodes: Secondary | ICD-10-CM | POA: Diagnosis not present

## 2016-10-23 DIAGNOSIS — I2510 Atherosclerotic heart disease of native coronary artery without angina pectoris: Secondary | ICD-10-CM

## 2016-10-23 DIAGNOSIS — T451X5A Adverse effect of antineoplastic and immunosuppressive drugs, initial encounter: Secondary | ICD-10-CM

## 2016-10-23 DIAGNOSIS — Z7189 Other specified counseling: Secondary | ICD-10-CM

## 2016-10-23 DIAGNOSIS — Z79899 Other long term (current) drug therapy: Secondary | ICD-10-CM

## 2016-10-23 DIAGNOSIS — C3490 Malignant neoplasm of unspecified part of unspecified bronchus or lung: Secondary | ICD-10-CM

## 2016-10-23 DIAGNOSIS — C7951 Secondary malignant neoplasm of bone: Secondary | ICD-10-CM | POA: Diagnosis not present

## 2016-10-23 DIAGNOSIS — Z803 Family history of malignant neoplasm of breast: Secondary | ICD-10-CM

## 2016-10-23 DIAGNOSIS — E785 Hyperlipidemia, unspecified: Secondary | ICD-10-CM

## 2016-10-23 DIAGNOSIS — Z8781 Personal history of (healed) traumatic fracture: Secondary | ICD-10-CM

## 2016-10-23 DIAGNOSIS — Z86718 Personal history of other venous thrombosis and embolism: Secondary | ICD-10-CM | POA: Diagnosis not present

## 2016-10-23 DIAGNOSIS — F17210 Nicotine dependence, cigarettes, uncomplicated: Secondary | ICD-10-CM

## 2016-10-23 DIAGNOSIS — G8929 Other chronic pain: Secondary | ICD-10-CM | POA: Diagnosis not present

## 2016-10-23 DIAGNOSIS — Z9221 Personal history of antineoplastic chemotherapy: Secondary | ICD-10-CM

## 2016-10-23 DIAGNOSIS — G893 Neoplasm related pain (acute) (chronic): Secondary | ICD-10-CM

## 2016-10-23 DIAGNOSIS — C3412 Malignant neoplasm of upper lobe, left bronchus or lung: Secondary | ICD-10-CM

## 2016-10-23 DIAGNOSIS — Z801 Family history of malignant neoplasm of trachea, bronchus and lung: Secondary | ICD-10-CM

## 2016-10-23 DIAGNOSIS — Z800 Family history of malignant neoplasm of digestive organs: Secondary | ICD-10-CM

## 2016-10-23 DIAGNOSIS — D6481 Anemia due to antineoplastic chemotherapy: Secondary | ICD-10-CM | POA: Diagnosis not present

## 2016-10-23 DIAGNOSIS — M5134 Other intervertebral disc degeneration, thoracic region: Secondary | ICD-10-CM

## 2016-10-23 DIAGNOSIS — I739 Peripheral vascular disease, unspecified: Secondary | ICD-10-CM

## 2016-10-23 LAB — COMPREHENSIVE METABOLIC PANEL
ALT: 13 U/L — ABNORMAL LOW (ref 17–63)
AST: 17 U/L (ref 15–41)
Albumin: 3.4 g/dL — ABNORMAL LOW (ref 3.5–5.0)
Alkaline Phosphatase: 81 U/L (ref 38–126)
Anion gap: 6 (ref 5–15)
BUN: 6 mg/dL (ref 6–20)
CO2: 25 mmol/L (ref 22–32)
Calcium: 8.1 mg/dL — ABNORMAL LOW (ref 8.9–10.3)
Chloride: 104 mmol/L (ref 101–111)
Creatinine, Ser: 0.73 mg/dL (ref 0.61–1.24)
GFR calc Af Amer: >60 mL/min (ref >60)
GFR calc non Af Amer: >60 mL/min (ref >60)
Glucose, Bld: 108 mg/dL — ABNORMAL HIGH (ref 65–99)
Potassium: 3.7 mmol/L (ref 3.5–5.1)
Sodium: 135 mmol/L (ref 135–145)
Total Bilirubin: 0.5 mg/dL (ref 0.3–1.2)
Total Protein: 6.5 g/dL (ref 6.5–8.1)

## 2016-10-23 LAB — CBC WITH DIFFERENTIAL/PLATELET
Basophils Absolute: 0 10*3/uL (ref 0–0.1)
Basophils Relative: 1 %
Eosinophils Absolute: 0 10*3/uL (ref 0–0.7)
Eosinophils Relative: 0 %
HCT: 23.4 % — ABNORMAL LOW (ref 40.0–52.0)
Hemoglobin: 8.6 g/dL — ABNORMAL LOW (ref 13.0–18.0)
Lymphocytes Relative: 15 %
Lymphs Abs: 1.1 10*3/uL (ref 1.0–3.6)
MCH: 33.6 pg (ref 26.0–34.0)
MCHC: 36.6 g/dL — ABNORMAL HIGH (ref 32.0–36.0)
MCV: 91.8 fL (ref 80.0–100.0)
Monocytes Absolute: 0.7 10*3/uL (ref 0.2–1.0)
Monocytes Relative: 10 %
Neutro Abs: 5.7 10*3/uL (ref 1.4–6.5)
Neutrophils Relative %: 74 %
Platelets: 185 10*3/uL (ref 150–440)
RBC: 2.55 MIL/uL — ABNORMAL LOW (ref 4.40–5.90)
RDW: 22.2 % — ABNORMAL HIGH (ref 11.5–14.5)
WBC: 7.6 10*3/uL (ref 3.8–10.6)

## 2016-10-23 LAB — MAGNESIUM: Magnesium: 1.5 mg/dL — ABNORMAL LOW (ref 1.7–2.4)

## 2016-10-23 LAB — TSH: TSH: 0.946 u[IU]/mL (ref 0.350–4.500)

## 2016-10-23 MED ORDER — HEPARIN SOD (PORK) LOCK FLUSH 100 UNIT/ML IV SOLN
500.0000 [IU] | Freq: Once | INTRAVENOUS | Status: AC
  Administered 2016-10-23: 500 [IU] via INTRAVENOUS

## 2016-10-23 MED ORDER — OXYCODONE HCL ER 10 MG PO T12A: 10.0000 mg | EXTENDED_RELEASE_TABLET | Freq: Three times a day (TID) | ORAL | 0 refills | Status: DC | PRN

## 2016-10-23 MED ORDER — OXYCODONE HCL 5 MG PO TABS: 5.0000 mg | ORAL_TABLET | ORAL | 0 refills | Status: DC | PRN

## 2016-10-23 MED ORDER — LORAZEPAM 0.5 MG PO TABS
0.5000 mg | ORAL_TABLET | Freq: Four times a day (QID) | ORAL | 0 refills | Status: DC | PRN
Start: 2016-10-23 — End: 2017-01-14

## 2016-10-23 MED ORDER — SODIUM CHLORIDE 0.9% FLUSH
10.0000 mL | Freq: Once | INTRAVENOUS | Status: AC
  Administered 2016-10-23: 10 mL via INTRAVENOUS
  Filled 2016-10-23: qty 10

## 2016-10-23 NOTE — Telephone Encounter (Signed)
-----   Message from Lequita Asal, MD sent at 10/23/2016 12:37 PM EDT ----- Regarding: Please call patient  Magnesium slightly low.  Try magnesium oxide 400 mg a day. Could cause diarrhea.  Tell patient to call if has any diarrhea.  M  ----- Message ----- From: Interface, Lab In [place] Sent: 10/23/2016   9:14 AM To: Lequita Asal, MD

## 2016-10-23 NOTE — Progress Notes (Signed)
[facility] day:  10/23/2016   Chief Complaint: Philip [name] is a 56 y.o. male with extensive stage small cell lung cancer who is seen for review of interval PET scan and assessment prior to cycle #5 carboplatin and etoposide and monthly Xgeva.  HPI:  The patient was last seen in the medical oncology clinic on 10/02/2016.  At that time, he noted chronic back pain.  He received cycle #4 carboplatin and etoposide with Neulasta support.  Chemotherapy dose was reduced secondary to prior myelosuppression.   CBC on 10/15/2016 revealed a hematocrit 20.5, hemoglobin 7.3, platelets 47,000, white count 3600 with an ANC of 2300. He received 1 unit of packed red blood cells on 10/15/2016.  Thoracic spine MRI on 10/22/2016 reveals sclerotic enhancing lesion at T7 vertebral body on the left compatible with metastasis. There was a healing fracture the right medial 10th rib.  There was mild thoracic disc degeneration.  Symptomatically, he notes chronic back pain. He is taking calcium 600 mg 3 times a day. He denies any respiratory symptoms.   Past Medical History:  Diagnosis Date  . Anemia   . CAD (coronary artery disease)    a. 08/2014 Inf STEMI/CABG x 3 (LIMA->LAD, VG->Diag, RIMA->RCA).  . DVT, recurrent, lower extremity, acute, left ([place]) 2016  . Hyperlipidemia   . Hypertension   . Hypertensive heart disease   . Lung cancer ([place])   . PAD (peripheral artery disease) ([place])    a. 03/2015 Periph Angio: short occlusion of L pop w/ evidence of embolization into the DP->5.0x50 mm Inova self-expanding stent;  b. 04/08/2015 ABI: R 1.12, L 0.99.  . Tobacco abuse   . Toe amputation status, left [facility]) 07/09/2016   2017    Past Surgical History:  Procedure Laterality Date  . CARDIAC CATHETERIZATION N/A 08/24/2014   Procedure: Left Heart Cath and Coronary Angiography;  Surgeon: Isaias Cowman, MD;  Location: [place] [facility];  Service: Cardiovascular;   Laterality: N/A;  . CARDIAC CATHETERIZATION N/A 08/24/2014   Procedure: Coronary Stent Intervention;  Surgeon: Isaias Cowman, MD;  Location: [facility] [facility];  Service: Cardiovascular;  Laterality: N/A;  . CORONARY ARTERY BYPASS GRAFT N/A 08/25/2014   Procedure: CORONARY ARTERY BYPASS GRAFTING (CABG), ON PUMP, TIMES THREE, USING BILATERAL MAMMARY ARTERIES, RIGHT GREATER SAPHENOUS VEIN HARVESTED ENDOSCOPICALLY;  Surgeon: Melrose Nakayama, MD;  Location: [facility];  Service: Open Heart Surgery;  Laterality: N/A;  . PERIPHERAL VASCULAR CATHETERIZATION N/A 03/30/2015   Procedure: Abdominal Aortogram w/Lower Extremity;  Surgeon: Wellington Hampshire, MD;  Location: [place] [facility];  Service: Cardiovascular;  Laterality: N/A;  . PORTA CATH INSERTION N/A 07/27/2016   Procedure: Glori Luis Cath Insertion;  Surgeon: Algernon Huxley, MD;  Location: [place] [facility];  Service: Cardiovascular;  Laterality: N/A;  . TEE WITHOUT CARDIOVERSION N/A 08/25/2014   Procedure: TRANSESOPHAGEAL ECHOCARDIOGRAM (TEE);  Surgeon: Melrose Nakayama, MD;  Location: [facility];  Service: Open Heart Surgery;  Laterality: N/A;    Family History  Problem Relation Age of Onset  . Hypertension Father   . Lung cancer Father   . Heart attack Mother 42       STENT  . Hyperlipidemia Mother   . Heart attack Brother   . Heart attack Brother   . Breast cancer Sister   . Heart attack Sister   . Colon cancer Paternal Grandfather     Social History:  reports that he has been smoking Cigarettes.  He has been smoking about 0.50 packs per day. He has never used smokeless tobacco. He reports that he drinks alcohol. He reports that he does not use drugs.  The patient smokes 1/2 pack of cigarettes/day (previously 2 1/2 ppd).  He started smoking in his teens.  He denies any exposure to radiation or toxins.  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  He lives in [place].  His wife is named Publishing copy.  He is accompanied by his wife  today.  Allergies:  Allergies  Allergen Reactions  . Amitriptyline Other (See Comments)    Burning sensation all over  . Contrast Media [Iodinated Diagnostic Agents] Other (See Comments)    Pt states that he got "knots behind his ears".  . Nsaids Other (See Comments)    Other reaction(s): Other (See Comments) Reaction:  Blood in stool  Reaction:  Blood in stool   . Ibuprofen Other (See Comments) and Nausea Only    Blood in stools    Current Medications: Current Outpatient Prescriptions  Medication Sig Dispense Refill  . atorvastatin (LIPITOR) 40 MG tablet TAKE 1 TABLET BY MOUTH DAILY 90 tablet 3  . chlorpheniramine-HYDROcodone (TUSSIONEX PENNKINETIC ER) 10-8 MG/5ML SUER Take 5 mLs by mouth every 12 (twelve) hours as needed for cough. 140 mL 0  . clopidogrel (PLAVIX) 75 MG tablet Take 75 mg by mouth daily.    . diphenhydrAMINE (BENADRYL) 25 mg capsule Take 25 mg by mouth 2 (two) times daily as needed for allergies.     Marland Kitchen lidocaine-prilocaine (EMLA) cream Apply to affected area once 30 g 3  . lisinopril (PRINIVIL,ZESTRIL) 5 MG tablet TAKE 1 TABLET BY MOUTH DAILY 90 tablet 1  . LORazepam (ATIVAN) 0.5 MG tablet Take 1 tablet (0.5 mg total) by mouth every 6 (six) hours as needed (Nausea or vomiting). 30 tablet 0  . metoprolol tartrate (LOPRESSOR) 25 MG tablet Take 1 tablet (25 mg total) by mouth 2 (two) times daily. 180 tablet 3  . omeprazole (PRILOSEC) 20 MG capsule Take 20 mg by mouth daily.    . ondansetron (ZOFRAN) 8 MG tablet Take 1 tablet (8 mg total) by mouth 2 (two) times daily as needed for refractory nausea / vomiting. Start on day 3 after carboplatin chemo. 30 tablet 1  . oxyCODONE (OXY IR/ROXICODONE) 5 MG immediate release tablet Take 1 tablet (5 mg total) by mouth every 4 (four) hours as needed for severe pain. 60 tablet 0  . oxyCODONE (OXYCONTIN) 10 mg 12 hr tablet Take 1 tablet (10 mg total) by mouth every 8 (eight) hours as needed. 90 tablet 0  . promethazine (PHENERGAN) 25  MG suppository Place 1 suppository (25 mg total) rectally every 6 (six) hours as needed for nausea or vomiting. 12 each 5   Current Facility-Administered Medications  Medication Dose Route Frequency Provider Last Rate Last Dose  . ondansetron (ZOFRAN-ODT) disintegrating tablet 8 mg  8 mg Oral Once Lloyd Huger, MD       Facility-Administered Medications Ordered in Other Visits  Medication Dose Route Frequency Provider Last Rate Last Dose  . heparin lock flush 100 unit/mL  500 Units Intravenous Once Lequita Asal, MD        Review of Systems:  GENERAL:  Feels "ok". No fevers or sweats.  Weight up 6 pounds. PERFORMANCE STATUS (ECOG):  1 HEENT:  No visual changes, runny nose, sore throat, mouth sores or tenderness. Lungs: No shortness of breath or cough.  No  hemoptysis. Cardiac:  No chest pain, palpitations, orthopnea, or PND. GI:  No nausea, vomiting, diarrhea, constipation, melena or hematochezia.  Colonoscopy 30 years ago.   GU:  No urgency, frequency, dysuria, or hematuria. Musculoskeletal:  Back pain (chronic).  No joint pain.  No muscle tenderness. Extremities:  No pain or swelling. Left toe removed (old). Skin:  No rashes or skin changes. Neuro:  No headache, numbness or weakness, balance or coordination issues. Endocrine:  No diabetes, thyroid issues, hot flashes or night sweats. Psych:  No mood changes, depression or anxiety. Pain:  No pain. Review of systems:  All other systems reviewed and found to be negative.  Physical Exam: Blood pressure 134/81, pulse 78, temperature 97.9 F (36.6 C), temperature source Tympanic, resp. rate 18, weight 152 lb (68.9 kg). GENERAL:  Well developed, well nourished, gentleman sitting comfortably in the exam room in no acute distress. MENTAL STATUS: Alert and oriented to person, place and time. HEAD:Alopecia.  Normocephalic, atraumatic, face symmetric, no Cushingoid features. EYES:[place]neyes. No scleral hemorrhage.  Pupils  equal round and reactive to light and accomodation. No conjunctivitis or scleral icterus. XBD:ZHGDJMEQAS clear without lesion. Tonguenormal. Poor dentition. Mucous membranes moist. RESPIRATORY:Clear to auscultationwithout rales, wheezes or rhonchi. CARDIOVASCULAR:Regular rate andrhythmwithout murmur, rub or gallop. ABDOMEN:Soft, non-tender, with active bowel sounds, and no hepatosplenomegaly. No masses. BACK:  Mild pain on palpation of T7. SKIN: No rashes, ulcers or lesions. EXTREMITIES: No lower extremity edema, no skin discoloration or tenderness. No palpable cords. LYMPHNODES: No palpable cervical, supraclavicular, axillary or inguinal adenopathy  NEUROLOGICAL: Unremarkable. PSYCH: Appropriate.   Infusion on 10/23/2016  Component Date Value Ref Range Status  . WBC 10/23/2016 7.6  3.8 - 10.6 K/uL Final  . RBC 10/23/2016 2.55* 4.40 - 5.90 MIL/uL Final  . Hemoglobin 10/23/2016 8.6* 13.0 - 18.0 g/dL Final  . HCT 10/23/2016 23.4* 40.0 - 52.0 % Final  . MCV 10/23/2016 91.8  80.0 - 100.0 fL Final  . MCH 10/23/2016 33.6  26.0 - 34.0 pg Final  . MCHC 10/23/2016 36.6* 32.0 - 36.0 g/dL Final  . RDW 10/23/2016 22.2* 11.5 - 14.5 % Final  . Platelets 10/23/2016 185  150 - 440 K/uL Final  . Neutrophils Relative % 10/23/2016 74  % Final  . Neutro Abs 10/23/2016 5.7  1.4 - 6.5 K/uL Final  . Lymphocytes Relative 10/23/2016 15  % Final  . Lymphs Abs 10/23/2016 1.1  1.0 - 3.6 K/uL Final  . Monocytes Relative 10/23/2016 10  % Final  . Monocytes Absolute 10/23/2016 0.7  0.2 - 1.0 K/uL Final  . Eosinophils Relative 10/23/2016 0  % Final  . Eosinophils Absolute 10/23/2016 0.0  0 - 0.7 K/uL Final  . Basophils Relative 10/23/2016 1  % Final  . Basophils Absolute 10/23/2016 0.0  0 - 0.1 K/uL Final  . Sodium 10/23/2016 135  135 - 145 mmol/L Final  . Potassium 10/23/2016 3.7  3.5 - 5.1 mmol/L Final  . Chloride 10/23/2016 104  101 - 111 mmol/L Final  . CO2 10/23/2016 25  22 - 32  mmol/L Final  . Glucose, Bld 10/23/2016 108* 65 - 99 mg/dL Final  . BUN 10/23/2016 6  6 - 20 mg/dL Final  . Creatinine, Ser 10/23/2016 0.73  0.61 - 1.24 mg/dL Final  . Calcium 10/23/2016 8.1* 8.9 - 10.3 mg/dL Final  . Total Protein 10/23/2016 6.5  6.5 - 8.1 g/dL Final  . Albumin 10/23/2016 3.4* 3.5 - 5.0 g/dL Final  . AST 10/23/2016 17  15 - 41  U/L Final  . ALT 10/23/2016 13* 17 - 63 U/L Final  . Alkaline Phosphatase 10/23/2016 81  38 - 126 U/L Final  . Total Bilirubin 10/23/2016 0.5  0.3 - 1.2 mg/dL Final  . GFR calc non Af Amer 10/23/2016 >60  >60 mL/min Final  . GFR calc Af Amer 10/23/2016 >60  >60 mL/min Final   Comment: (NOTE) The eGFR has been calculated using the CKD EPI equation. This calculation has not been validated in all clinical situations. eGFR's persistently <60 mL/min signify possible Chronic Kidney Disease.   . Anion gap 10/23/2016 6  5 - 15 Final  . Magnesium 10/23/2016 1.5* 1.7 - 2.4 mg/dL Final    Assessment:  Philip [name] is a 56 y.o. male with extensive stage small cell lung cancer.  He presented with a 2 year history of night sweats and a 36 pound weight loss in 6 months.  Right supraclavicular biopsy on 07/19/2016 revealed small cell carcinoma of the lung.  CEA was 1.7 on 07/31/2016.  Chest CT on 06/28/2016 revealed extensive adenopathy.  There were multiple lymph nodes descending thoracic aorta, largest measuring 2.1 x 1.7 cm.  There was adenopathy in the aortopulmonary window with the largest confluence of lymph nodes measuring 4 x 2.6 cm. There was hilar adenopathy on the left measuring 2 x 1.9 cm. There were multiple enlarged lymph nodes in the pretracheal region, largest 1.7 x 1.7 cm.  There were lymph nodes to the left of the carina measuring 2.8 x 1.9 cm. There was a subcarinal lymph node measuring 2.4 x 1.6 cm. There was a 4 x 3 mm nodular opacity in the anterior segment of the left upper lobe.    PET scan on 07/09/2016 revealed central left upper  lobe/suprahilar primary bronchogenic carcinoma.  There was nodal metastasis within the chest and supraclavicular/low jugular regions.  There was multifocal osseous metastasis.  There was possibly a pathologic fracture involving the posteromedial right tenth rib.  There was new left lower lobe opacity with mild hypermetabolism, suspicious for infection or postobstructive pneumonitis.  He completed a course of Levaquin for apost-obstructive pneumonia.  Head MRI on 07/26/2016 revealed no evidence of brain metastasis. There was punctate DWI hyperintensity along a high right frontal gyrus, possible recent infarct.  There was a small occipital bone metastasis.  Head MRA on 07/27/2016 revealed normal large and medium sized vessels.  Bilateral carotid duplex on 07/27/2016 revealed <50% stenosis in the right and left internal carotis arteries.  Echo on 07/27/2016 revealed an EF of 45-50% without cardiac source of emboli.  He is s/p 4 cycles of carboplatin and etoposide (07/31/2016 - 10/02/2016) with OnPro Neulasta support.  Platelet nadir was 40,000 on day 16 of cycle #2 and 23,000 on day 15 of cycle #3.  He received 2 units of PRBC with cycle #3 and 1 unit with cycle #4.  He received Xgeva on 08/13/2016.  Chest CT on 09/11/2016 revealed a marked response to therapy.  There was near complete resolution of left suprahilar mass and thoracic adenopathy.  There was interval sclerosis indicative of healing site of osseous metastasis.  Thoracic spine MRI on 10/22/2016 reveals sclerotic enhancing lesion at T7 vertebral body on the left compatible with metastasis. There was a healing fracture the right medial 10th rib.  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  Anemia work-up on 05/29/.2018 revealed the following normal labs:  Ferritin (219), iron sat (28%), TIBC (227), B12 (3055), and folate (6.8).  He has a history of  left lower extremity DVT a few months after his cardiac surgery in 08/2014.  He has peripheral  vascular disease s/p stent placement in 03/2015.  He is on a Plavix.  He has not had a colonoscopy in 30 years.  Symptomatically, he remains fatigued.  He has chronic mid back discomfort.  Exam is unremarkable.  Corrected calcium is 8.58 (low).  Plan: 1.  Labs today:  CBC with diff, CMP, Mg. 2.  Discuss results of thoracic spine MRI.  Metastasis at T7.  Lesion likely old.  Consider radiation after completion of chemotherapy. 3.  Discuss no PET scan.  Reschedule.  Patient allergic to contrast dye- causes neck swelling.  Disease is predominantly in chest and bone. 4.  Discuss anemia and need for transfusions.  Discuss chemotherapy induced anemia and use of Procrit. 5.  Reschedule PET scan ASAP. 6.  No chemotherapy today. 7.  Add TSH to today's labs. 8.  Pre-auth Procrit. 9.  Hold Xgeva.  Increase calcium from 3 pills/day to 4 pills/day. 10.  Rx:  Ativan, oxycodone, Oxycontin. 11.  RTC after PET scan for MD assessment, labs (CBC with diff, CMP, Mg), review of PET scan, +/- carboplatin + etoposide, and Xgeva.   Lequita Asal, MD  10/23/2016, 11:00 AM

## 2016-10-23 NOTE — Progress Notes (Signed)
Patient continues to have nausea.  Appetite is improving but still not 100%.  Patient states he has a problem with his right eye.  Patient asking for refills for Oxycodone 10 mg, Oxycodone 5 mg and Ativan 0.5 mg.  All meds should now be sent to [facility].  Patient now has Medicaid.  Patient states he got his CT scan yesterday but did not get the PET due to order error.

## 2016-10-23 NOTE — Telephone Encounter (Signed)
Called patient and LVM that his magnesium is a little low.  Instructed to take magnesium oxide 400 mg daily.  Advised that he can develop diarrhea and if so he is to call.

## 2016-10-24 ENCOUNTER — Inpatient Hospital Stay: Payer: Medicaid Other

## 2016-10-25 ENCOUNTER — Ambulatory Visit: Payer: Medicaid Other

## 2016-10-28 ENCOUNTER — Encounter: Payer: Self-pay | Admitting: Hematology and Oncology

## 2016-10-28 DIAGNOSIS — G8929 Other chronic pain: Secondary | ICD-10-CM | POA: Insufficient documentation

## 2016-10-28 DIAGNOSIS — D6481 Anemia due to antineoplastic chemotherapy: Secondary | ICD-10-CM | POA: Insufficient documentation

## 2016-10-28 DIAGNOSIS — M546 Pain in thoracic spine: Secondary | ICD-10-CM

## 2016-10-28 DIAGNOSIS — T451X5A Adverse effect of antineoplastic and immunosuppressive drugs, initial encounter: Secondary | ICD-10-CM

## 2016-10-30 ENCOUNTER — Inpatient Hospital Stay: Payer: Medicaid Other

## 2016-10-30 ENCOUNTER — Telehealth: Payer: Self-pay | Admitting: *Deleted

## 2016-10-30 ENCOUNTER — Other Ambulatory Visit: Payer: Self-pay | Admitting: Hematology and Oncology

## 2016-10-30 ENCOUNTER — Ambulatory Visit: Payer: Medicaid Other

## 2016-10-30 ENCOUNTER — Inpatient Hospital Stay: Payer: Medicaid Other | Admitting: Hematology and Oncology

## 2016-10-30 DIAGNOSIS — C3412 Malignant neoplasm of upper lobe, left bronchus or lung: Secondary | ICD-10-CM | POA: Diagnosis not present

## 2016-10-30 DIAGNOSIS — G893 Neoplasm related pain (acute) (chronic): Secondary | ICD-10-CM

## 2016-10-30 DIAGNOSIS — C3490 Malignant neoplasm of unspecified part of unspecified bronchus or lung: Secondary | ICD-10-CM

## 2016-10-30 DIAGNOSIS — C7951 Secondary malignant neoplasm of bone: Secondary | ICD-10-CM

## 2016-10-30 DIAGNOSIS — Z7189 Other specified counseling: Secondary | ICD-10-CM

## 2016-10-30 LAB — COMPREHENSIVE METABOLIC PANEL
ALT: 13 U/L — ABNORMAL LOW (ref 17–63)
AST: 20 U/L (ref 15–41)
Albumin: 3.3 g/dL — ABNORMAL LOW (ref 3.5–5.0)
Alkaline Phosphatase: 70 U/L (ref 38–126)
Anion gap: 7 (ref 5–15)
BUN: 6 mg/dL (ref 6–20)
CO2: 27 mmol/L (ref 22–32)
Calcium: 8.9 mg/dL (ref 8.9–10.3)
Chloride: 98 mmol/L — ABNORMAL LOW (ref 101–111)
Creatinine, Ser: 0.85 mg/dL (ref 0.61–1.24)
GFR calc Af Amer: >60 mL/min (ref >60)
GFR calc non Af Amer: >60 mL/min (ref >60)
Glucose, Bld: 84 mg/dL (ref 65–99)
Potassium: 3.5 mmol/L (ref 3.5–5.1)
Sodium: 132 mmol/L — ABNORMAL LOW (ref 135–145)
Total Bilirubin: 0.7 mg/dL (ref 0.3–1.2)
Total Protein: 6.4 g/dL — ABNORMAL LOW (ref 6.5–8.1)

## 2016-10-30 LAB — CBC WITH DIFFERENTIAL/PLATELET
Basophils Absolute: 0.1 10*3/uL (ref 0–0.1)
Basophils Relative: 1 %
Eosinophils Absolute: 0 10*3/uL (ref 0–0.7)
Eosinophils Relative: 1 %
HCT: 26.5 % — ABNORMAL LOW (ref 40.0–52.0)
Hemoglobin: 9.5 g/dL — ABNORMAL LOW (ref 13.0–18.0)
Lymphocytes Relative: 20 %
Lymphs Abs: 1 10*3/uL (ref 1.0–3.6)
MCH: 33.9 pg (ref 26.0–34.0)
MCHC: 35.7 g/dL (ref 32.0–36.0)
MCV: 94.8 fL (ref 80.0–100.0)
Monocytes Absolute: 0.5 10*3/uL (ref 0.2–1.0)
Monocytes Relative: 11 %
Neutro Abs: 3.2 10*3/uL (ref 1.4–6.5)
Neutrophils Relative %: 67 %
Platelets: 289 10*3/uL (ref 150–440)
RBC: 2.8 MIL/uL — ABNORMAL LOW (ref 4.40–5.90)
RDW: 24.1 % — ABNORMAL HIGH (ref 11.5–14.5)
WBC: 4.8 10*3/uL (ref 3.8–10.6)

## 2016-10-30 LAB — MAGNESIUM: Magnesium: 0.9 mg/dL — CL (ref 1.7–2.4)

## 2016-10-30 MED ORDER — MAGNESIUM SULFATE 4 GM/50ML IV SOLN
4.0000 g | Freq: Once | INTRAVENOUS | Status: AC
  Administered 2016-10-30: 4 g via INTRAVENOUS
  Filled 2016-10-30: qty 50

## 2016-10-30 MED ORDER — MAGNESIUM SULFATE 4 GM/100ML IV SOLN: 4.0000 g | Freq: Once | INTRAVENOUS | Status: DC

## 2016-10-30 MED ORDER — SODIUM CHLORIDE 0.9 % IV SOLN
Freq: Once | INTRAVENOUS | Status: AC
  Administered 2016-10-30: 14:00:00 via INTRAVENOUS
  Filled 2016-10-30: qty 1000

## 2016-10-30 MED ORDER — HEPARIN SOD (PORK) LOCK FLUSH 100 UNIT/ML IV SOLN
INTRAVENOUS | Status: AC
  Filled 2016-10-30: qty 5

## 2016-10-30 MED ORDER — DENOSUMAB 120 MG/1.7ML ~~LOC~~ SOLN
120.0000 mg | Freq: Once | SUBCUTANEOUS | Status: AC
  Administered 2016-10-30: 120 mg via SUBCUTANEOUS
  Filled 2016-10-30: qty 1.7

## 2016-10-30 MED ORDER — EPOETIN ALFA 10000 UNIT/ML IJ SOLN
10000.0000 [IU] | Freq: Once | INTRAMUSCULAR | Status: AC
  Administered 2016-10-30: 10000 [IU] via SUBCUTANEOUS
  Filled 2016-10-30: qty 2

## 2016-10-30 NOTE — Telephone Encounter (Signed)
Patient informed of magnesium level and need for IV mag, ok to come back this afternoon

## 2016-10-30 NOTE — Telephone Encounter (Signed)
Critical Lab - Mag. 0.9  MD notified.

## 2016-10-31 ENCOUNTER — Other Ambulatory Visit: Payer: Medicaid Other

## 2016-10-31 ENCOUNTER — Ambulatory Visit: Payer: Medicaid Other | Admitting: Hematology and Oncology

## 2016-10-31 ENCOUNTER — Ambulatory Visit: Payer: Medicaid Other

## 2016-11-01 ENCOUNTER — Inpatient Hospital Stay: Payer: Medicaid Other

## 2016-11-01 ENCOUNTER — Ambulatory Visit: Payer: Medicaid Other

## 2016-11-01 NOTE — Progress Notes (Signed)
Nutrition  Patient identified on Malnutrition Screening Report for weight loss and poor appetite.  Planning on meeting with patient during infusion today but infusion cancelled.    Edmund Rick B. Zenia Resides, [place], [place] Registered Dietitian [phone] (pager)

## 2016-11-02 ENCOUNTER — Inpatient Hospital Stay: Payer: Medicaid Other

## 2016-11-11 ENCOUNTER — Emergency Department
Admission: EM | Admit: 2016-11-11 | Discharge: 2016-11-11 | Disposition: A | Discharge status: Home / Self Care | Payer: Medicaid Other | Attending: Emergency Medicine | Admitting: Emergency Medicine

## 2016-11-11 ENCOUNTER — Encounter: Payer: Self-pay | Admitting: Emergency Medicine

## 2016-11-11 DIAGNOSIS — Z7902 Long term (current) use of antithrombotics/antiplatelets: Secondary | ICD-10-CM | POA: Insufficient documentation

## 2016-11-11 DIAGNOSIS — L02512 Cutaneous abscess of left hand: Secondary | ICD-10-CM | POA: Insufficient documentation

## 2016-11-11 DIAGNOSIS — Z8673 Personal history of transient ischemic attack (TIA), and cerebral infarction without residual deficits: Secondary | ICD-10-CM | POA: Diagnosis not present

## 2016-11-11 DIAGNOSIS — L0291 Cutaneous abscess, unspecified: Secondary | ICD-10-CM

## 2016-11-11 DIAGNOSIS — Z79899 Other long term (current) drug therapy: Secondary | ICD-10-CM | POA: Insufficient documentation

## 2016-11-11 DIAGNOSIS — G8929 Other chronic pain: Secondary | ICD-10-CM | POA: Diagnosis not present

## 2016-11-11 DIAGNOSIS — I2510 Atherosclerotic heart disease of native coronary artery without angina pectoris: Secondary | ICD-10-CM | POA: Diagnosis not present

## 2016-11-11 DIAGNOSIS — I10 Essential (primary) hypertension: Secondary | ICD-10-CM | POA: Insufficient documentation

## 2016-11-11 DIAGNOSIS — I119 Hypertensive heart disease without heart failure: Secondary | ICD-10-CM | POA: Diagnosis not present

## 2016-11-11 DIAGNOSIS — F17210 Nicotine dependence, cigarettes, uncomplicated: Secondary | ICD-10-CM | POA: Insufficient documentation

## 2016-11-11 DIAGNOSIS — M79642 Pain in left hand: Secondary | ICD-10-CM | POA: Diagnosis present

## 2016-11-11 LAB — BASIC METABOLIC PANEL
Anion gap: 6 (ref 5–15)
BUN: 7 mg/dL (ref 6–20)
CHLORIDE: 103 mmol/L (ref 101–111)
CO2: 26 mmol/L (ref 22–32)
Calcium: 8.3 mg/dL — ABNORMAL LOW (ref 8.9–10.3)
Creatinine, Ser: 0.71 mg/dL (ref 0.61–1.24)
GFR calc Af Amer: >60 mL/min (ref >60)
GFR calc non Af Amer: >60 mL/min (ref >60)
GLUCOSE: 104 mg/dL — AB (ref 65–99)
Potassium: 3.4 mmol/L — ABNORMAL LOW (ref 3.5–5.1)
Sodium: 135 mmol/L (ref 135–145)

## 2016-11-11 LAB — CBC
HCT: 30.4 % — ABNORMAL LOW (ref 40.0–52.0)
HEMOGLOBIN: 10.6 g/dL — AB (ref 13.0–18.0)
MCH: 36.6 pg — AB (ref 26.0–34.0)
MCHC: 35.1 g/dL (ref 32.0–36.0)
MCV: 104.5 fL — AB (ref 80.0–100.0)
Platelets: 161 10*3/uL (ref 150–440)
RBC: 2.91 MIL/uL — AB (ref 4.40–5.90)
RDW: 20 % — ABNORMAL HIGH (ref 11.5–14.5)
WBC: 8 10*3/uL (ref 3.8–10.6)

## 2016-11-11 MED ORDER — DOXYCYCLINE HYCLATE 100 MG PO CAPS: 100.0000 mg | ORAL_CAPSULE | Freq: Two times a day (BID) | ORAL | 0 refills | Status: DC

## 2016-11-11 MED ORDER — DOXYCYCLINE HYCLATE 100 MG PO TABS
100.0000 mg | ORAL_TABLET | Freq: Once | ORAL | Status: AC
  Administered 2016-11-11: 100 mg via ORAL
  Filled 2016-11-11: qty 1

## 2016-11-11 MED ORDER — HEPARIN SOD (PORK) LOCK FLUSH 100 UNIT/ML IV SOLN
INTRAVENOUS | Status: AC
  Administered 2016-11-11: 500 [IU]
  Filled 2016-11-11: qty 5

## 2016-11-11 MED ORDER — LIDOCAINE HCL (PF) 1 % IJ SOLN
5.0000 mL | Freq: Once | INTRAMUSCULAR | Status: DC
Start: 2016-11-11 — End: 2016-11-11
  Filled 2016-11-11: qty 5

## 2016-11-11 NOTE — ED Notes (Signed)
Patient presents with abscess type wound to the left hand/wrist with tracking along the arm to the elbow. Warm to the touch and red.

## 2016-11-11 NOTE — ED Triage Notes (Signed)
Pt to ED with c/o of possible bug bite to left hand. Pt states the pain started last Sunday and has increased. Pt is chemo pt and his last treatment approx 2 weeks ago.

## 2016-11-11 NOTE — ED Notes (Signed)
Pt c/o "bug bite" to L hand. Pustule with surrounding swelling/redness/heat/pain noted to L hand with red streaks running up L arm. Pt states streaks were not present yesterday. Pt receiving chemotherapy for stage IV lung CA, last treatment 2 weeks ago.

## 2016-11-11 NOTE — Discharge Instructions (Signed)
Please seek medical attention for any high fevers, chest pain, shortness of breath, change in behavior, persistent vomiting, bloody stool or any other new or concerning symptoms.  

## 2016-11-11 NOTE — ED Provider Notes (Signed)
[facility] Emergency Department Provider Note   ____________________________________________   I have reviewed the triage vital signs and the nursing notes.   HISTORY  Chief Complaint Left hand pain  History limited by: Not Limited   HPI Philip [name] is a 56 y.o. male who presents to the emergency department today because of concerns for left hand pain and redness and swelling. The symptoms started roughly 1 week ago. The patient initially thought that he had a small splinter under his skin. He then noticed increasing redness and swelling. The redness did start extending more proximally. He states that he did try squeezing her without any drainage. He denies any fevers. Has had some nausea but thinks that it could be related to chemotherapy.  Past Medical History:  Diagnosis Date  . Anemia   . CAD (coronary artery disease)    a. 08/2014 Inf STEMI/CABG x 3 (LIMA->LAD, VG->Diag, RIMA->RCA).  . DVT, recurrent, lower extremity, acute, left ([place]) 2016  . Hyperlipidemia   . Hypertension   . Hypertensive heart disease   . Lung cancer ([place])   . PAD (peripheral artery disease) ([place])    a. 03/2015 Periph Angio: short occlusion of L pop w/ evidence of embolization into the DP->5.0x50 mm Inova self-expanding stent;  b. 04/08/2015 ABI: R 1.12, L 0.99.  . Tobacco abuse   . Toe amputation status, left [facility]) 07/09/2016   2017    Patient Active Problem List   Diagnosis Date Noted  . Thoracic spine pain 10/28/2016  . Antineoplastic chemotherapy induced anemia 10/28/2016  . Encounter for antineoplastic chemotherapy 09/10/2016  . Charcot's joint of foot, right 08/21/2016  . GERD (gastroesophageal reflux disease) 08/21/2016  . Myocardial infarction ([place]) 08/21/2016  . Hyponatremia 08/19/2016  . Hypocalcemia 08/19/2016  . Hypomagnesemia 08/19/2016  . Routine history and physical examination of adult 08/09/2016  . Cancer of upper lobe of left lung ([place])  07/31/2016  . Cancer related pain 07/31/2016  . Cough 07/31/2016  . Goals of care, counseling/discussion 07/26/2016  . Acute CVA (cerebrovascular accident) ([place]) 07/26/2016  . Small cell lung cancer ([place]) 07/19/2016  . Postobstructive pneumonia 07/17/2016  . Bone metastasis ([place]) 07/12/2016  . Lymphadenopathy, mediastinal 07/02/2016  . Weight loss 07/02/2016  . Night sweats 07/02/2016  . Chronic pain in left foot 03/21/2016  . Chronic pain syndrome 03/21/2016  . Neuropathic pain 03/21/2016  . Iron deficiency anemia 01/08/2016  . Dry gangrene ([place]) 01/02/2016  . Osteopenia of both feet 12/10/2015  . Peripheral neuropathy 06/12/2015  . Hyperlipidemia, unspecified   . PAD (peripheral artery disease) ([place]) 03/29/2015  . Hypertension 10/05/2014  . Pure hypercholesterolemia 10/05/2014  . Acute sinusitis, unspecified 09/14/2014  . Rash and other nonspecific skin eruption 09/14/2014  . S/P CABG x 3 08/25/2014  . Status post aorto-coronary artery bypass graft 08/25/2014  . History of MI (myocardial infarction) 08/24/2014  . CAD (coronary artery disease), native coronary artery 08/24/2014  . Acute myocardial infarction of inferior wall ([place]) 08/24/2014  . Right ankle pain 07/20/2014  . Pain in joint involving ankle and foot 07/20/2014  . Bilateral foot pain 07/06/2014  . Tobacco use disorder 07/06/2014  . Convulsions ([place]) 07/06/2014  . Preventative health care 07/06/2014  . Elevated blood-pressure reading without diagnosis of hypertension 07/06/2014  . Pain in soft tissues of limb 07/06/2014    Past Surgical History:  Procedure Laterality Date  . CARDIAC CATHETERIZATION N/A 08/24/2014   Procedure: Left Heart Cath and Coronary Angiography;  Surgeon: Isaias Cowman,  MD;  Location: [place] [facility];  Service: Cardiovascular;  Laterality: N/A;  . CARDIAC CATHETERIZATION N/A 08/24/2014   Procedure: Coronary Stent Intervention;  Surgeon: Isaias Cowman, MD;  Location: [place] [facility];  Service: Cardiovascular;  Laterality: N/A;  . CORONARY ARTERY BYPASS GRAFT N/A 08/25/2014   Procedure: CORONARY ARTERY BYPASS GRAFTING (CABG), ON PUMP, TIMES THREE, USING BILATERAL MAMMARY ARTERIES, RIGHT GREATER SAPHENOUS VEIN HARVESTED ENDOSCOPICALLY;  Surgeon: Melrose Nakayama, MD;  Location: [place];  Service: Open Heart Surgery;  Laterality: N/A;  . PERIPHERAL VASCULAR CATHETERIZATION N/A 03/30/2015   Procedure: Abdominal Aortogram w/Lower Extremity;  Surgeon: Wellington Hampshire, MD;  Location: [place] [facility];  Service: Cardiovascular;  Laterality: N/A;  . PORTA CATH INSERTION N/A 07/27/2016   Procedure: Glori Luis Cath Insertion;  Surgeon: Algernon Huxley, MD;  Location: [place] [facility];  Service: Cardiovascular;  Laterality: N/A;  . TEE WITHOUT CARDIOVERSION N/A 08/25/2014   Procedure: TRANSESOPHAGEAL ECHOCARDIOGRAM (TEE);  Surgeon: Melrose Nakayama, MD;  Location: [place];  Service: Open Heart Surgery;  Laterality: N/A;    Prior to Admission medications   Medication Sig Start Date End Date Taking? Authorizing Provider  atorvastatin (LIPITOR) 40 MG tablet TAKE 1 TABLET BY MOUTH DAILY 08/02/16   Wellington Hampshire, MD  chlorpheniramine-HYDROcodone (TUSSIONEX PENNKINETIC ER) 10-8 MG/5ML SUER Take 5 mLs by mouth every 12 (twelve) hours as needed for cough. 08/21/16   Lloyd Huger, MD  clopidogrel (PLAVIX) 75 MG tablet Take 75 mg by mouth daily.    [provider]  diphenhydrAMINE (BENADRYL) 25 mg capsule Take 25 mg by mouth 2 (two) times daily as needed for allergies.     [provider]  lidocaine-prilocaine (EMLA) cream Apply to affected area once 07/31/16   Lequita Asal, MD  lisinopril (PRINIVIL,ZESTRIL) 5 MG tablet TAKE 1 TABLET BY MOUTH DAILY 06/11/16   Rogelia Mire, NP  LORazepam (ATIVAN) 0.5 MG tablet Take 1 tablet (0.5 mg total) by mouth every 6 (six) hours as needed (Nausea or vomiting). 10/23/16   Lequita Asal, MD  metoprolol  tartrate (LOPRESSOR) 25 MG tablet Take 1 tablet (25 mg total) by mouth 2 (two) times daily. 07/22/15   Minna Merritts, MD  omeprazole (PRILOSEC) 20 MG capsule Take 20 mg by mouth daily.    [provider]  ondansetron (ZOFRAN) 8 MG tablet Take 1 tablet (8 mg total) by mouth 2 (two) times daily as needed for refractory nausea / vomiting. Start on day 3 after carboplatin chemo. 07/31/16   Lequita Asal, MD  oxyCODONE (OXY IR/ROXICODONE) 5 MG immediate release tablet Take 1 tablet (5 mg total) by mouth every 4 (four) hours as needed for severe pain. 10/23/16   Lequita Asal, MD  oxyCODONE (OXYCONTIN) 10 mg 12 hr tablet Take 1 tablet (10 mg total) by mouth every 8 (eight) hours as needed. 10/23/16   Lequita Asal, MD  promethazine (PHENERGAN) 25 MG suppository Place 1 suppository (25 mg total) rectally every 6 (six) hours as needed for nausea or vomiting. 10/02/16   Lequita Asal, MD    Allergies Amitriptyline; Contrast media [iodinated diagnostic agents]; Nsaids; and Ibuprofen  Family History  Problem Relation Age of Onset  . Hypertension Father   . Lung cancer Father   . Heart attack Mother 29       STENT  . Hyperlipidemia Mother   . Heart attack Brother   . Heart attack Brother   .  Breast cancer Sister   . Heart attack Sister   . Colon cancer Paternal Grandfather     Social History Social History  Substance Use Topics  . Smoking status: Current Every Day Smoker    Packs/day: 0.50    Types: Cigarettes  . Smokeless tobacco: Never Used     Comment: 4cigs day down from 2ppd  . Alcohol use 0.0 oz/week     Comment: Sometimes on w/e.    Review of Systems Constitutional: No fever. Eyes: No visual changes. ENT: No sore throat. Cardiovascular: Denies chest pain. Respiratory: Denies shortness of breath. Gastrointestinal: Positive for nausea.  Genitourinary: Negative for dysuria. Musculoskeletal: Positive for left hand pain. Skin: Positive for redness to  left hand. Neurological: Negative for headaches, focal weakness or numbness.  ____________________________________________   PHYSICAL EXAM:  VITAL SIGNS: ED Triage Vitals [11/11/16 1507]  Enc Vitals Group     BP (!) 167/96     Pulse Rate 87     Resp 18     Temp 98.3 F (36.8 C)     Temp Source Oral     SpO2 98 %     Weight 152 lb (68.9 kg)     Height 5\' 11"  (1.803 m)   Constitutional: Alert and oriented. Well appearing and in no distress. Eyes: Conjunctivae are normal.  ENT   Head: Normocephalic and atraumatic.   Nose: No congestion/rhinnorhea.   Mouth/Throat: Mucous membranes are moist.   Neck: No stridor. Hematological/Lymphatic/Immunilogical: No cervical lymphadenopathy. Cardiovascular: Normal rate, regular rhythm.  No murmurs, rubs, or gallops.  Respiratory: Normal respiratory effort without tachypnea nor retractions. Breath sounds are clear and equal bilaterally. No wheezes/rales/rhonchi. Gastrointestinal: Soft and non tender. No rebound. No guarding.  Genitourinary: Deferred Musculoskeletal: Normal range of motion in all extremities. No lower extremity edema. Neurologic:  Normal speech and language. No gross focal neurologic deficits are appreciated.  Skin:  Abscess noted to base of left thumb Psychiatric: Mood and affect are normal. Speech and behavior are normal. Patient exhibits appropriate insight and judgment.  ____________________________________________    LABS (pertinent positives/negatives)  Labs Reviewed  CBC - Abnormal; Notable for the following:       Result Value   RBC 2.91 (*)    Hemoglobin 10.6 (*)    HCT 30.4 (*)    MCV 104.5 (*)    MCH 36.6 (*)    RDW 20.0 (*)    All other components within normal limits  BASIC METABOLIC PANEL - Abnormal; Notable for the following:    Potassium 3.4 (*)    Glucose, Bld 104 (*)    Calcium 8.3 (*)    All other components within normal limits      ____________________________________________   EKG  None  ____________________________________________    RADIOLOGY  None   ____________________________________________   PROCEDURES  Procedures  Incision and Drainage of Abcess Location: left hand Anesthesia Local: 1% Lidocaine  Prep/Procedure: Skin Prep: Chlorahex Incised abscess with #11 blade Purulent discharge: small Probed to break up loculations Packed with 1/4" gauze Estimated blood loss: 1 ml  ____________________________________________   INITIAL IMPRESSION / ASSESSMENT AND PLAN / ED COURSE  Pertinent labs & imaging results that were available during my care of the patient were reviewed by me and considered in my medical decision making (see chart for details).  Patient presented to the emergency department today because of concerns for left hand swelling pain and redness. On exam he did have small abscess base slump. This was I&D. Given  the surround cellulitis he will be started on antibiotics.  ____________________________________________   FINAL CLINICAL IMPRESSION(S) / ED DIAGNOSES  Final diagnoses:  Abscess     Note: This dictation was prepared with Dragon dictation. Any transcriptional errors that result from this process are unintentional     Nance Pear, MD 11/11/16 1905

## 2016-11-11 NOTE — ED Triage Notes (Signed)
FIRST NURSE NOTE-redness noted to hand. ambulatory no distress

## 2016-11-11 NOTE — ED Notes (Signed)
Heparin lock prior to deaccessing port a cath

## 2016-11-12 ENCOUNTER — Encounter
Admission: RE | Admit: 2016-11-12 | Discharge: 2016-11-12 | Disposition: A | Discharge status: Home / Self Care | Payer: Medicaid Other | Source: Ambulatory Visit | Attending: Hematology and Oncology | Admitting: Hematology and Oncology

## 2016-11-12 DIAGNOSIS — C3490 Malignant neoplasm of unspecified part of unspecified bronchus or lung: Secondary | ICD-10-CM | POA: Diagnosis present

## 2016-11-12 DIAGNOSIS — C7951 Secondary malignant neoplasm of bone: Secondary | ICD-10-CM | POA: Diagnosis present

## 2016-11-12 LAB — GLUCOSE, CAPILLARY: Glucose-Capillary: 99 mg/dL (ref 65–99)

## 2016-11-12 MED ORDER — FLUDEOXYGLUCOSE F - 18 (FDG) INJECTION
12.6000 | Freq: Once | INTRAVENOUS | Status: AC | PRN
  Administered 2016-11-12: 12.6 via INTRAVENOUS

## 2016-11-13 ENCOUNTER — Emergency Department (HOSPITAL_COMMUNITY)
Admission: EM | Admit: 2016-11-13 | Discharge: 2016-11-13 | Disposition: A | Discharge status: Home / Self Care | Payer: Medicaid Other | Attending: Emergency Medicine | Admitting: Emergency Medicine

## 2016-11-13 ENCOUNTER — Encounter: Payer: Self-pay | Admitting: Hematology and Oncology

## 2016-11-13 ENCOUNTER — Inpatient Hospital Stay (HOSPITAL_BASED_OUTPATIENT_CLINIC_OR_DEPARTMENT_OTHER): Payer: Medicaid Other | Admitting: Hematology and Oncology

## 2016-11-13 VITALS — BP 135/88 | HR 83 | Temp 97.3°F | Resp 18 | Wt 146.4 lb

## 2016-11-13 DIAGNOSIS — R5383 Other fatigue: Secondary | ICD-10-CM

## 2016-11-13 DIAGNOSIS — Z951 Presence of aortocoronary bypass graft: Secondary | ICD-10-CM | POA: Insufficient documentation

## 2016-11-13 DIAGNOSIS — I2510 Atherosclerotic heart disease of native coronary artery without angina pectoris: Secondary | ICD-10-CM | POA: Diagnosis not present

## 2016-11-13 DIAGNOSIS — C7951 Secondary malignant neoplasm of bone: Secondary | ICD-10-CM

## 2016-11-13 DIAGNOSIS — G8929 Other chronic pain: Secondary | ICD-10-CM

## 2016-11-13 DIAGNOSIS — Z801 Family history of malignant neoplasm of trachea, bronchus and lung: Secondary | ICD-10-CM

## 2016-11-13 DIAGNOSIS — E785 Hyperlipidemia, unspecified: Secondary | ICD-10-CM

## 2016-11-13 DIAGNOSIS — M549 Dorsalgia, unspecified: Secondary | ICD-10-CM | POA: Diagnosis not present

## 2016-11-13 DIAGNOSIS — Z9221 Personal history of antineoplastic chemotherapy: Secondary | ICD-10-CM

## 2016-11-13 DIAGNOSIS — D6481 Anemia due to antineoplastic chemotherapy: Secondary | ICD-10-CM

## 2016-11-13 DIAGNOSIS — R590 Localized enlarged lymph nodes: Secondary | ICD-10-CM | POA: Diagnosis not present

## 2016-11-13 DIAGNOSIS — Z803 Family history of malignant neoplasm of breast: Secondary | ICD-10-CM

## 2016-11-13 DIAGNOSIS — Z7902 Long term (current) use of antithrombotics/antiplatelets: Secondary | ICD-10-CM | POA: Diagnosis not present

## 2016-11-13 DIAGNOSIS — M79642 Pain in left hand: Secondary | ICD-10-CM | POA: Diagnosis present

## 2016-11-13 DIAGNOSIS — C3490 Malignant neoplasm of unspecified part of unspecified bronchus or lung: Secondary | ICD-10-CM

## 2016-11-13 DIAGNOSIS — Z79899 Other long term (current) drug therapy: Secondary | ICD-10-CM | POA: Diagnosis not present

## 2016-11-13 DIAGNOSIS — L02512 Cutaneous abscess of left hand: Secondary | ICD-10-CM | POA: Diagnosis not present

## 2016-11-13 DIAGNOSIS — I739 Peripheral vascular disease, unspecified: Secondary | ICD-10-CM

## 2016-11-13 DIAGNOSIS — M5134 Other intervertebral disc degeneration, thoracic region: Secondary | ICD-10-CM

## 2016-11-13 DIAGNOSIS — F17210 Nicotine dependence, cigarettes, uncomplicated: Secondary | ICD-10-CM | POA: Insufficient documentation

## 2016-11-13 DIAGNOSIS — G893 Neoplasm related pain (acute) (chronic): Secondary | ICD-10-CM

## 2016-11-13 DIAGNOSIS — Z800 Family history of malignant neoplasm of digestive organs: Secondary | ICD-10-CM

## 2016-11-13 DIAGNOSIS — I119 Hypertensive heart disease without heart failure: Secondary | ICD-10-CM | POA: Insufficient documentation

## 2016-11-13 DIAGNOSIS — Z8781 Personal history of (healed) traumatic fracture: Secondary | ICD-10-CM

## 2016-11-13 DIAGNOSIS — Z7189 Other specified counseling: Secondary | ICD-10-CM

## 2016-11-13 DIAGNOSIS — Z86718 Personal history of other venous thrombosis and embolism: Secondary | ICD-10-CM

## 2016-11-13 DIAGNOSIS — C3412 Malignant neoplasm of upper lobe, left bronchus or lung: Secondary | ICD-10-CM

## 2016-11-13 DIAGNOSIS — I10 Essential (primary) hypertension: Secondary | ICD-10-CM

## 2016-11-13 MED ORDER — OXYCODONE HCL 5 MG PO TABS: 5.0000 mg | ORAL_TABLET | ORAL | 0 refills | Status: DC | PRN

## 2016-11-13 MED ORDER — AMOXICILLIN-POT CLAVULANATE 875-125 MG PO TABS
1.0000 | ORAL_TABLET | Freq: Once | ORAL | Status: AC
  Administered 2016-11-13: 1 via ORAL
  Filled 2016-11-13: qty 1

## 2016-11-13 MED ORDER — LIDOCAINE HCL (PF) 1 % IJ SOLN
INTRAMUSCULAR | Status: AC
  Administered 2016-11-13: 2.1 mL
  Filled 2016-11-13: qty 5

## 2016-11-13 MED ORDER — LIDOCAINE HCL (PF) 1 % IJ SOLN
5.0000 mL | Freq: Once | INTRAMUSCULAR | Status: AC
  Administered 2016-11-13: 5 mL
  Filled 2016-11-13: qty 5

## 2016-11-13 MED ORDER — CEFTRIAXONE SODIUM 1 G IJ SOLR
1.0000 g | Freq: Once | INTRAMUSCULAR | Status: AC
  Administered 2016-11-13: 1 g via INTRAMUSCULAR
  Filled 2016-11-13: qty 10

## 2016-11-13 MED ORDER — AMOXICILLIN-POT CLAVULANATE 875-125 MG PO TABS: 1.0000 | ORAL_TABLET | Freq: Two times a day (BID) | ORAL | 0 refills | Status: DC

## 2016-11-13 NOTE — ED Provider Notes (Signed)
  Face-to-face evaluation   History: She complains of worsening swelling in the palm of the left hand despite a drainage procedure done 2 days ago.  He has started taking doxycycline without relief.  He denies nausea vomiting weakness or dizziness.  Physical exam: Left hand with hypo-thenar eminence swelling, and blister above an erythematous area.  There is evident lymphangitis in the left volar forearm.  Normal range of motion left wrist and hand.  Medical screening examination/treatment/procedure(s) were conducted as a shared visit with non-physician practitioner(s) and myself.  I personally evaluated the patient during the encounter   Daleen Bo, MD 11/15/16 [phone]

## 2016-11-13 NOTE — Discharge Instructions (Signed)
Apply warm compresses or soak the area in warm water 4 times a day for 10 to 15 minutes. Take the antibiotics as directed. Follow up for wound check tomorrow. Return for any problems.

## 2016-11-13 NOTE — Progress Notes (Signed)
Patient states he went to ED on Sunday.  He has an area come up on his left hand that was infected with red streaks down his arm.  States they lanced it and put him on doxycycline.  Today it is larger and painful.  States he is unable to sleep very well.  Patient also requesting refill for lorazepam and oxycodone.

## 2016-11-13 NOTE — ED Provider Notes (Signed)
[facility] DEPT Provider Note   CSN: [id number] Arrival date & time: 11/13/16  1635  By signing my name below, I, Marcello Moores, attest that this documentation has been prepared under the direction and in the presence of Debroah Baller, NP Electronically Signed: Marcello Moores, ED Scribe. 11/13/16. 2:10 AM.  History   Chief Complaint Chief Complaint  Patient presents with  . Abscess   The history is provided by the patient. No language interpreter was used.  Abscess  Location:  Hand Hand abscess location:  R hand Abscess quality: not draining   Associated symptoms: no fever     HPI Comments: Philip [name] is a 56 y.o. male with lung cancer (stage 4) who presents to the Emergency Department complaining of right hand pain due to a small spot (possible abcess) that began 6 days ago. He states that he originally thought it was a splinter and tiied to "pop it" with inadequate relief. He also complains of stiffness on his hight ring and little fingers, and swelling to his right forearm that he believes his due to the possible abcess. The pt was seen 2 days ago at [facility] where the area was drained and he was prescribed doxycycline. The pt reports that he went to his oncologists yesterday where he was told to get the area treated within the next few weeks in order to participate in the research study. The pt denies fever. The oncologist sent the patient to an orthopedic office that sent the patient here to the ED.  Past Medical History:  Diagnosis Date  . Anemia   . CAD (coronary artery disease)    a. 08/2014 Inf STEMI/CABG x 3 (LIMA->LAD, VG->Diag, RIMA->RCA).  . DVT, recurrent, lower extremity, acute, left ([place]) 2016  . Hyperlipidemia   . Hypertension   . Hypertensive heart disease   . Lung cancer ([place])   . PAD (peripheral artery disease) ([place])    a. 03/2015 Periph Angio: short occlusion of L pop w/ evidence of embolization into the DP->5.0x50 mm Inova self-expanding stent;  b.  04/08/2015 ABI: R 1.12, L 0.99.  . Tobacco abuse   . Toe amputation status, left [facility]) 07/09/2016   2017    Patient Active Problem List   Diagnosis Date Noted  . Abscess of left hand 11/13/2016  . Thoracic spine pain 10/28/2016  . Antineoplastic chemotherapy induced anemia 10/28/2016  . Encounter for antineoplastic chemotherapy 09/10/2016  . Charcot's joint of foot, right 08/21/2016  . GERD (gastroesophageal reflux disease) 08/21/2016  . Myocardial infarction ([place]) 08/21/2016  . Hyponatremia 08/19/2016  . Hypocalcemia 08/19/2016  . Hypomagnesemia 08/19/2016  . Routine history and physical examination of adult 08/09/2016  . Cancer of upper lobe of left lung ([place]) 07/31/2016  . Cancer related pain 07/31/2016  . Cough 07/31/2016  . Goals of care, counseling/discussion 07/26/2016  . Acute CVA (cerebrovascular accident) ([place]) 07/26/2016  . 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 Postobstructive pneumonia 07/17/2016  . Bone metastasis ([place]) 07/12/2016  . Lymphadenopathy, mediastinal 07/02/2016  . Weight loss 07/02/2016  . Night sweats 07/02/2016  . Chronic pain in left foot 03/21/2016  . Chronic pain syndrome 03/21/2016  . Neuropathic pain 03/21/2016  . Iron deficiency anemia 01/08/2016  . Dry gangrene ([place]) 01/02/2016  . Osteopenia of both feet 12/10/2015  . Peripheral neuropathy 06/12/2015  . Hyperlipidemia, unspecified   . PAD (peripheral artery disease) ([place]) 03/29/2015  . Hypertension 10/05/2014  . Pure hypercholesterolemia 10/05/2014  . Acute sinusitis, unspecified 09/14/2014  .  Rash and other nonspecific skin eruption 09/14/2014  . S/P CABG x 3 08/25/2014  . Status post aorto-coronary artery bypass graft 08/25/2014  . History of MI (myocardial infarction) 08/24/2014  . CAD (coronary artery disease), native coronary artery 08/24/2014  . Acute myocardial infarction of inferior wall ([place]) 08/24/2014  . Right ankle pain 07/20/2014  . Pain in joint involving ankle and  foot 07/20/2014  . Bilateral foot pain 07/06/2014  . Tobacco use disorder 07/06/2014  . Convulsions ([place]) 07/06/2014  . Preventative health care 07/06/2014  . Elevated blood-pressure reading without diagnosis of hypertension 07/06/2014  . Pain in soft tissues of limb 07/06/2014    Past Surgical History:  Procedure Laterality Date  . CARDIAC CATHETERIZATION N/A 08/24/2014   Procedure: Left Heart Cath and Coronary Angiography;  Surgeon: Isaias Cowman, MD;  Location: [facility];  Service: Cardiovascular;  Laterality: N/A;  . CARDIAC CATHETERIZATION N/A 08/24/2014   Procedure: Coronary Stent Intervention;  Surgeon: Isaias Cowman, MD;  Location: [place] [facility];  Service: Cardiovascular;  Laterality: N/A;  . CORONARY ARTERY BYPASS GRAFT N/A 08/25/2014   Procedure: CORONARY ARTERY BYPASS GRAFTING (CABG), ON PUMP, TIMES THREE, USING BILATERAL MAMMARY ARTERIES, RIGHT GREATER SAPHENOUS VEIN HARVESTED ENDOSCOPICALLY;  Surgeon: Melrose Nakayama, MD;  Location: [facility];  Service: Open Heart Surgery;  Laterality: N/A;  . PERIPHERAL VASCULAR CATHETERIZATION N/A 03/30/2015   Procedure: Abdominal Aortogram w/Lower Extremity;  Surgeon: Wellington Hampshire, MD;  Location: [place] [facility];  Service: Cardiovascular;  Laterality: N/A;  . PORTA CATH INSERTION N/A 07/27/2016   Procedure: Glori Luis Cath Insertion;  Surgeon: Algernon Huxley, MD;  Location: [place];  Service: Cardiovascular;  Laterality: N/A;  . TEE WITHOUT CARDIOVERSION N/A 08/25/2014   Procedure: TRANSESOPHAGEAL ECHOCARDIOGRAM (TEE);  Surgeon: Melrose Nakayama, MD;  Location: [facility];  Service: Open Heart Surgery;  Laterality: N/A;       Home Medications    Prior to Admission medications   Medication Sig Start Date End Date Taking? Authorizing Provider  amoxicillin-clavulanate (AUGMENTIN) 875-125 MG tablet Take 1 tablet by mouth 2 (two) times daily. 11/13/16   Ashley Murrain, NP  atorvastatin (LIPITOR) 40 MG  tablet TAKE 1 TABLET BY MOUTH DAILY 08/02/16   Wellington Hampshire, MD  chlorpheniramine-HYDROcodone [facility] PENNKINETIC ER) 10-8 MG/5ML SUER Take 5 mLs by mouth every 12 (twelve) hours as needed for cough. 08/21/16   [place] Huger, MD  clopidogrel (PLAVIX) 75 MG tablet Take 75 mg by mouth daily.    [provider]  diphenhydrAMINE (BENADRYL) 25 mg capsule Take 25 mg by mouth 2 (two) times daily as needed for allergies.     [provider]  doxycycline (VIBRAMYCIN) 100 MG capsule Take 1 capsule (100 mg total) by mouth 2 (two) times daily. 11/11/16   Nance Pear, MD  lidocaine-prilocaine (EMLA) cream Apply to affected area once 07/31/16   Lequita Asal, MD  lisinopril (PRINIVIL,ZESTRIL) 5 MG tablet TAKE 1 TABLET BY MOUTH DAILY 06/11/16   Rogelia Mire, NP  LORazepam (ATIVAN) 0.5 MG tablet Take 1 tablet (0.5 mg total) by mouth every 6 (six) hours as needed (Nausea or vomiting). 10/23/16   Lequita Asal, MD  metoprolol tartrate (LOPRESSOR) 25 MG tablet Take 1 tablet (25 mg total) by mouth 2 (two) times daily. 07/22/15   Minna Merritts, MD  omeprazole (PRILOSEC) 20 MG capsule Take 20 mg by mouth daily.    [provider]  ondansetron [facility]) 8  MG tablet Take 1 tablet (8 mg total) by mouth 2 (two) times daily as needed for refractory nausea / vomiting. Start on day 3 after carboplatin chemo. 07/31/16   Lequita Asal, MD  oxyCODONE (OXY IR/ROXICODONE) 5 MG immediate release tablet Take 1 tablet (5 mg total) by mouth every 4 (four) hours as needed for severe pain. 11/13/16   Lequita Asal, MD  oxyCODONE (OXYCONTIN) 10 mg 12 hr tablet Take 1 tablet (10 mg total) by mouth every 8 (eight) hours as needed. 10/23/16   Lequita Asal, MD  promethazine (PHENERGAN) 25 MG suppository Place 1 suppository (25 mg total) rectally every 6 (six) hours as needed for nausea or vomiting. 10/02/16   Lequita Asal, MD    Family History Family History    Problem Relation Age of Onset  . Hypertension Father   . Lung cancer Father   . Heart attack Mother 36       STENT  . Hyperlipidemia Mother   . Heart attack Brother   . Heart attack Brother   . Breast cancer Sister   . Heart attack Sister   . Colon cancer Paternal Grandfather     Social History Social History  Substance Use Topics  . Smoking status: Current Every Day Smoker    Packs/day: 0.50    Types: Cigarettes  . Smokeless tobacco: Never Used     Comment: 4cigs day down from 2ppd  . Alcohol use 0.0 oz/week     Comment: Sometimes on w/e.     Allergies   Amitriptyline; Contrast media [iodinated diagnostic agents]; Nsaids; and Ibuprofen   Review of Systems Review of Systems  Constitutional: Negative for fever.  Skin: Positive for wound.  Allergic/Immunologic: Positive for immunocompromised state.  Hematological: Positive for adenopathy.     Physical Exam Updated Vital Signs BP 130/88 (BP Location: Right Arm)   Pulse 95   Temp 98.3 F (36.8 C) (Oral)   Resp 16   Ht 5\' 11"  (1.803 m)   Wt 66.4 kg (146 lb 7 oz)   SpO2 100%   BMI 20.42 kg/m   Physical Exam  Constitutional: He is oriented to person, place, and time. He appears well-developed and well-nourished.  HENT:  Head: Normocephalic.  Eyes: EOM are normal.  Neck: Normal range of motion.  Cardiovascular: Tachycardia present.   Pulmonary/Chest: Effort normal.  Abdominal: He exhibits no distension.  Musculoskeletal: Normal range of motion.  Neurological: He is alert and oriented to person, place, and time.  Skin: Skin is warm and dry.  Psychiatric: He has a normal mood and affect.  Nursing note and vitals reviewed.    ED Treatments / Results   DIAGNOSTIC STUDIES: Oxygen Saturation is 99% on RA, normal by my interpretation.   COORDINATION OF CARE: 6:50 PM-Discussed next steps with pt. Pt verbalized understanding and is agreeable with the plan.   Labs (all labs ordered are listed, but only  abnormal results are displayed) Labs Reviewed - No data to display  Radiology Nm Pet Image Restag (ps) Skull Base To Thigh  Result Date: 11/12/2016 CLINICAL DATA:  Subsequent treatment strategy for left lung small cell carcinoma with bone metastases. EXAM: NUCLEAR MEDICINE PET SKULL BASE TO THIGH TECHNIQUE: 12.6 mCi F-18 FDG was injected intravenously. Full-ring PET imaging was performed from the skull base to thigh after the radiotracer. CT data was obtained and used for attenuation correction and anatomic localization. FASTING BLOOD GLUCOSE:  Value: 99 mg/dl COMPARISON:  CT on 09/11/2016 and  PET-CT on 07/09/2016 FINDINGS: NECK:  No hypermetabolic lymph nodes or masses. CHEST: A new 10 mm lymph node is seen in the left axilla with FDG uptake, and SUV max measuring 3.6. No other hypermetabolic lymph nodes or masses identified within the thorax. 3 mm pulmonary nodule in the anterior left upper lobe remains stable. Mild emphysema again noted. No evidence of pleural effusion. ABDOMEN/PELVIS: No abnormal hypermetabolic activity within the liver, pancreas, adrenal glands, or spleen. No hypermetabolic lymph nodes in the abdomen or pelvis. Small gallstones again seen, without evidence of cholecystitis. Colonic diverticulosis, without radiographic evidence of diverticulitis. Aortic atherosclerosis. SKELETON: Multiple sclerotic bone metastases are again seen on CT images, however the show no FDG uptake on today's study, consistent with treated bone metastasis. IMPRESSION: New 10 mm hypermetabolic left axillary lymph node. No other sites of metabolically active disease identified. Cholelithiasis.  No radiographic evidence of cholecystitis. Colonic diverticulosis, without radiographic evidence of diverticulitis. Aortic Atherosclerosis (ICD10-I70.0) and Emphysema (ICD10-J43.9). Electronically Signed   By: Earle Gell M.D.   On: 11/12/2016 11:31    Procedures .Mar[place] KitchenIncision and Drainage Date/Time: 11/14/2016 2:12  AM Performed by: Ashley Murrain Authorized by: Ashley Murrain   Consent:    Consent obtained:  Verbal   Consent given by:  Patient   Risks discussed:  Bleeding and pain   Alternatives discussed:  No treatment and alternative treatment Location:    Type:  Abscess   Size:  3 cm   Location:  Upper extremity   Upper extremity location:  Hand   Hand location:  L hand Pre-procedure details:    Skin preparation:  Betadine Anesthesia (see MAR for exact dosages):    Anesthesia method:  Local infiltration   Local anesthetic:  Lidocaine 1% w/o epi Procedure type:    Complexity:  Complex Procedure details:    Needle aspiration: no     Incision types:  Single straight   Incision depth:  Dermal   Scalpel blade:  11   Drainage:  Purulent   Drainage amount:  Moderate   Wound treatment:  Wound left open Post-procedure details:    Patient tolerance of procedure:  Tolerated well, no immediate complications   (including critical care time)  Medications Ordered in ED Medications  lidocaine (PF) (XYLOCAINE) 1 % injection 5 mL (5 mLs Infiltration Given 11/13/16 1944)  amoxicillin-clavulanate (AUGMENTIN) 875-125 MG per tablet 1 tablet (1 tablet Oral Given 11/13/16 2017)  cefTRIAXone (ROCEPHIN) injection 1 g (1 g Intramuscular Given 11/13/16 2019)  lidocaine (PF) (XYLOCAINE) 1 % injection (2.1 mLs  Given 11/13/16 2018)   Dr. Eulis Foster in to examine the patient.   Initial Impression / Assessment and Plan / ED Course  I have reviewed the triage vital signs and the nursing notes. Patient with skin abscess. Incision and drainage performed in the ED today.  Abscess was not large enough to warrant packing or drain placement. Wound recheck in 2 days. Supportive care and return precautions discussed.  Pt sent home with Augmentin in addition to the Doxycycline he is currently taking. The patient appears reasonably screened and/or stabilized for discharge and I doubt any other emergent medical condition requiring  further screening, evaluation, or treatment in the ED prior to discharge.  Final Clinical Impressions(s) / ED Diagnoses   Final diagnoses:  Abscess of left hand    New Prescriptions Discharge Medication List as of 11/13/2016  8:08 PM    START taking these medications   Details  amoxicillin-clavulanate (AUGMENTIN) 875-125 MG tablet Take 1  tablet by mouth 2 (two) times daily., Starting Tue 11/13/2016, Print      I personally performed the services described in this documentation, which was scribed in my presence. The recorded information has been reviewed and is accurate.    Debroah Baller [place], [place] 11/14/16 [id number]    Daleen Bo, MD 11/15/16 [phone]

## 2016-11-13 NOTE — ED Notes (Signed)
PT states understanding of care given, follow up care, and medication prescribed. PT ambulated from ED to car with a steady gait. 

## 2016-11-13 NOTE — ED Provider Notes (Signed)
  Face-to-face evaluation   History:   Physical exam:  Medical screening examination/treatment/procedure(s) were conducted as a shared visit with non-physician practitioner(s) and myself.  I personally evaluated the patient during the encounter   Daleen Bo, MD 11/13/16 [phone]

## 2016-11-13 NOTE — Progress Notes (Signed)
[facility] day:  11/13/2016   Chief Complaint: BLAND RUDZINSKI is a 56 y.o. male with extensive stage small cell lung cancer who is seen for review of interval PET scan and discussion regarding direction of therapy.  HPI:  The patient was last seen in the medical oncology clinic on 10/23/2016.  At that time, he noted chronic back pain. Thoracic spine MRI revealed sclerotic enhancing lesion at T7 vertebral body on the left compatible with metastasis.  We discussed consideration of radiation after completion of chemotherapy.  Delton See was held secondary to hypocalcemia.  PET scan was reordered.  Patient's calcium supplementation was increased.  He received Xgeva on 10/30/2016.  PET scan on 11/12/2016 revealed a new 10 mm hypermetabolic left axillary lymph node.  There were no other sites of metabolically active disease.  The patient was seen in the [facility] ER on 11/11/2016 with left hand pain, swelling and erythema.  He denied any fever.  He had an abscess that was I&D.  He was started on doxycycline BID for surrounding cellulitis.  His wife noted red streaks up the arm.  Symptomatically, the "little spot" has become bigger.  The area is painful.  The red streaks have improved.  He has no fever.   Past Medical History:  Diagnosis Date  . Anemia   . CAD (coronary artery disease)    a. 08/2014 Inf STEMI/CABG x 3 (LIMA->LAD, VG->Diag, RIMA->RCA).  . DVT, recurrent, lower extremity, acute, left ([place]) 2016  . Hyperlipidemia   . Hypertension   . Hypertensive heart disease   . Lung cancer ([place])   . PAD (peripheral artery disease) ([place])    a. 03/2015 Periph Angio: short occlusion of L pop w/ evidence of embolization into the DP->5.0x50 mm Inova self-expanding stent;  b. 04/08/2015 ABI: R 1.12, L 0.99.  . Tobacco abuse   . Toe amputation status, left [facility]) 07/09/2016   2017    Past Surgical History:  Procedure Laterality Date  . CARDIAC  CATHETERIZATION N/A 08/24/2014   Procedure: Left Heart Cath and Coronary Angiography;  Surgeon: Isaias Cowman, MD;  Location: [place] [facility];  Service: Cardiovascular;  Laterality: N/A;  . CARDIAC CATHETERIZATION N/A 08/24/2014   Procedure: Coronary Stent Intervention;  Surgeon: Isaias Cowman, MD;  Location: [place] [facility];  Service: Cardiovascular;  Laterality: N/A;  . CORONARY ARTERY BYPASS GRAFT N/A 08/25/2014   Procedure: CORONARY ARTERY BYPASS GRAFTING (CABG), ON PUMP, TIMES THREE, USING BILATERAL MAMMARY ARTERIES, RIGHT GREATER SAPHENOUS VEIN HARVESTED ENDOSCOPICALLY;  Surgeon: Melrose Nakayama, MD;  Location: [place];  Service: Open Heart Surgery;  Laterality: N/A;  . PERIPHERAL VASCULAR CATHETERIZATION N/A 03/30/2015   Procedure: Abdominal Aortogram w/Lower Extremity;  Surgeon: Wellington Hampshire, MD;  Location: [place] [facility];  Service: Cardiovascular;  Laterality: N/A;  . PORTA CATH INSERTION N/A 07/27/2016   Procedure: Glori Luis Cath Insertion;  Surgeon: Algernon Huxley, MD;  Location: [place] [facility];  Service: Cardiovascular;  Laterality: N/A;  . TEE WITHOUT CARDIOVERSION N/A 08/25/2014   Procedure: TRANSESOPHAGEAL ECHOCARDIOGRAM (TEE);  Surgeon: Melrose Nakayama, MD;  Location: [place];  Service: Open Heart Surgery;  Laterality: N/A;    Family History  Problem Relation Age of Onset  . Hypertension Father   . Lung cancer Father   . Heart attack Mother 73       STENT  . Hyperlipidemia Mother   . Heart attack Brother   . Heart attack Brother   .  Breast cancer Sister   . Heart attack Sister   . Colon cancer Paternal Grandfather     Social History:  reports that he has been smoking Cigarettes.  He has been smoking about 0.50 packs per day. He has never used smokeless tobacco. He reports that he drinks alcohol. He reports that he does not use drugs.  The patient smokes 1/2 pack of cigarettes/day (previously 2 1/2 ppd).  He started smoking in his teens.  He  denies any exposure to radiation or toxins.  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  He lives in [place].  His wife is named Publishing copy.  He is accompanied by his wife today.  Allergies:  Allergies  Allergen Reactions  . Amitriptyline Other (See Comments)    Burning sensation all over  . Contrast Media [Iodinated Diagnostic Agents] Other (See Comments)    Pt states that he got "knots behind his ears".  . Nsaids Other (See Comments)    Other reaction(s): Other (See Comments) Reaction:  Blood in stool  Reaction:  Blood in stool   . Ibuprofen Other (See Comments) and Nausea Only    Blood in stools    Current Medications: Current Outpatient Prescriptions  Medication Sig Dispense Refill  . atorvastatin (LIPITOR) 40 MG tablet TAKE 1 TABLET BY MOUTH DAILY 90 tablet 3  . chlorpheniramine-HYDROcodone (TUSSIONEX PENNKINETIC ER) 10-8 MG/5ML SUER Take 5 mLs by mouth every 12 (twelve) hours as needed for cough. 140 mL 0  . clopidogrel (PLAVIX) 75 MG tablet Take 75 mg by mouth daily.    . diphenhydrAMINE (BENADRYL) 25 mg capsule Take 25 mg by mouth 2 (two) times daily as needed for allergies.     Marland Kitchen doxycycline (VIBRAMYCIN) 100 MG capsule Take 1 capsule (100 mg total) by mouth 2 (two) times daily. 20 capsule 0  . lidocaine-prilocaine (EMLA) cream Apply to affected area once 30 g 3  . lisinopril (PRINIVIL,ZESTRIL) 5 MG tablet TAKE 1 TABLET BY MOUTH DAILY 90 tablet 1  . LORazepam (ATIVAN) 0.5 MG tablet Take 1 tablet (0.5 mg total) by mouth every 6 (six) hours as needed (Nausea or vomiting). 30 tablet 0  . metoprolol tartrate (LOPRESSOR) 25 MG tablet Take 1 tablet (25 mg total) by mouth 2 (two) times daily. 180 tablet 3  . omeprazole (PRILOSEC) 20 MG capsule Take 20 mg by mouth daily.    . ondansetron (ZOFRAN) 8 MG tablet Take 1 tablet (8 mg total) by mouth 2 (two) times daily as needed for refractory nausea / vomiting. Start on day 3 after carboplatin chemo. 30 tablet 1  . oxyCODONE (OXY  IR/ROXICODONE) 5 MG immediate release tablet Take 1 tablet (5 mg total) by mouth every 4 (four) hours as needed for severe pain. 60 tablet 0  . oxyCODONE (OXYCONTIN) 10 mg 12 hr tablet Take 1 tablet (10 mg total) by mouth every 8 (eight) hours as needed. 90 tablet 0  . promethazine (PHENERGAN) 25 MG suppository Place 1 suppository (25 mg total) rectally every 6 (six) hours as needed for nausea or vomiting. 12 each 5   Current Facility-Administered Medications  Medication Dose Route Frequency Provider Last Rate Last Dose  . ondansetron (ZOFRAN-ODT) disintegrating tablet 8 mg  8 mg Oral Once Lloyd Huger, MD        Review of Systems:  GENERAL:  Feels "ok".  No fevers or sweats.  Weight up and down 6 pounds. PERFORMANCE STATUS (ECOG):  1 HEENT:  No visual  changes, runny nose, sore throat, mouth sores or tenderness. Lungs: No shortness of breath or cough.  No hemoptysis. Cardiac:  No chest pain, palpitations, orthopnea, or PND. GI:  No nausea, vomiting, diarrhea, constipation, melena or hematochezia.  Colonoscopy 30 years ago.   GU:  No urgency, frequency, dysuria, or hematuria. Musculoskeletal:  Back pain (chronic).  No joint pain.  No muscle tenderness. Extremities:  No pain or swelling. Left toe removed (old). Skin:  Left hand abscess.  No rashes or skin changes. Neuro:  No headache, numbness or weakness, balance or coordination issues. Endocrine:  No diabetes, thyroid issues, hot flashes or night sweats. Psych:  No mood changes, depression or anxiety. Pain:  No pain. Review of systems:  All other systems reviewed and found to be negative.  Physical Exam: Blood pressure 135/88, pulse 83, temperature (!) 97.3 F (36.3 C), temperature source Tympanic, resp. rate 18, weight 146 lb 7 oz (66.4 kg). GENERAL:  Well developed, well nourished, gentleman sitting comfortably in the exam room in no acute distress. MENTAL STATUS: Alert and oriented to person, place and time. HEAD:Alopecia.   Normocephalic, atraumatic, face symmetric, no Cushingoid features. EYES:[place]. No scleral hemorrhage.  Pupils equal round and reactive to light and accomodation. No conjunctivitis or scleral icterus. PIR:JJOACZYSAY clear without lesion. Tonguenormal. Poor dentition. Mucous membranes moist. RESPIRATORY:Clear to auscultationwithout rales, wheezes or rhonchi. CARDIOVASCULAR:Regular rate andrhythmwithout murmur, rub or gallop. ABDOMEN:Soft, non-tender, with active bowel sounds, and no hepatosplenomegaly. No masses. BACK:  Mild pain on palpation of T7. SKIN: Left hand with 2 x 1.5 cm fluctuant area with purulence underneath at the hypothenar eminence. 5th digit flexed.  Pain on pressure to tip of digit. EXTREMITIES: No lower extremity edema, no skin discoloration or tenderness. No palpable cords. LYMPHNODES: No palpable cervical, supraclavicular, axillary or inguinal adenopathy  NEUROLOGICAL: Unremarkable. PSYCH: Appropriate.   Hospital Outpatient Visit on 11/12/2016  Component Date Value Ref Range Status  . Glucose-Capillary 11/12/2016 99  65 - 99 mg/dL Final  Admission on 11/11/2016, Discharged on 11/11/2016  Component Date Value Ref Range Status  . WBC 11/11/2016 8.0  3.8 - 10.6 K/uL Final  . RBC 11/11/2016 2.91* 4.40 - 5.90 MIL/uL Final  . Hemoglobin 11/11/2016 10.6* 13.0 - 18.0 g/dL Final  . HCT 11/11/2016 30.4* 40.0 - 52.0 % Final  . MCV 11/11/2016 104.5* 80.0 - 100.0 fL Final  . MCH 11/11/2016 36.6* 26.0 - 34.0 pg Final  . MCHC 11/11/2016 35.1  32.0 - 36.0 g/dL Final  . RDW 11/11/2016 20.0* 11.5 - 14.5 % Final  . Platelets 11/11/2016 161  150 - 440 K/uL Final  . Sodium 11/11/2016 135  135 - 145 mmol/L Final  . Potassium 11/11/2016 3.4* 3.5 - 5.1 mmol/L Final  . Chloride 11/11/2016 103  101 - 111 mmol/L Final  . CO2 11/11/2016 26  22 - 32 mmol/L Final  . Glucose, Bld 11/11/2016 104* 65 - 99 mg/dL Final  . BUN 11/11/2016 7  6 - 20 mg/dL Final  .  Creatinine, Ser 11/11/2016 0.71  0.61 - 1.24 mg/dL Final  . Calcium 11/11/2016 8.3* 8.9 - 10.3 mg/dL Final  . GFR calc non Af Amer 11/11/2016 >60  >60 mL/min Final  . GFR calc Af Amer 11/11/2016 >60  >60 mL/min Final   Comment: (NOTE) The eGFR has been calculated using the CKD EPI equation. This calculation has not been validated in all clinical situations. eGFR's persistently <60 mL/min signify possible Chronic Kidney Disease.   . Anion gap 11/11/2016 6  5 - 15 Final    Assessment:  EZANA HUBBERT is a 56 y.o. male with extensive stage small cell lung cancer.  He presented with a 2 year history of night sweats and a 36 pound weight loss in 6 months.  Right supraclavicular biopsy on 07/19/2016 revealed small cell carcinoma of the lung.  CEA was 1.7 on 07/31/2016.  Chest CT on 06/28/2016 revealed extensive adenopathy.  There were multiple lymph nodes descending thoracic aorta, largest measuring 2.1 x 1.7 cm.  There was adenopathy in the aortopulmonary window with the largest confluence of lymph nodes measuring 4 x 2.6 cm. There was hilar adenopathy on the left measuring 2 x 1.9 cm. There were multiple enlarged lymph nodes in the pretracheal region, largest 1.7 x 1.7 cm.  There were lymph nodes to the left of the carina measuring 2.8 x 1.9 cm. There was a subcarinal lymph node measuring 2.4 x 1.6 cm. There was a 4 x 3 mm nodular opacity in the anterior segment of the left upper lobe.    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  There was nodal metastasis within the chest and supraclavicular/low jugular regions.  There was multifocal osseous metastasis.  There was possibly a pathologic fracture involving the posteromedial right tenth rib.  There was new left lower lobe opacity with mild hypermetabolism, suspicious for infection or postobstructive pneumonitis.  He completed a course of Levaquin for apost-obstructive pneumonia.  He  experienced transient positional left facial burning.  Head MRI on 07/26/2016 revealed no evidence of brain metastasis. There was punctate DWI hyperintensity along a high right frontal gyrus, possible recent infarct.  There was a small occipital bone metastasis.  Head MRA on 07/27/2016 revealed normal large and medium sized vessels.  Bilateral carotid duplex on 07/27/2016 revealed <50% stenosis in the right and left internal carotis arteries.  Echo on 07/27/2016 revealed an EF of 45-50% without cardiac source of emboli.  He is s/p 4 cycles of carboplatin and etoposide (07/31/2016 - 10/02/2016) with OnPro Neulasta support.  Platelet nadir was 40,000 on day 16 of cycle #2 and 23,000 on day 15 of cycle #3.  He received 2 units of PRBC with cycle #3 and 1 unit with cycle #4.  He received Xgeva on 08/13/2016 (last 10/30/2016).  Chest CT on 09/11/2016 revealed a marked response to therapy.  There was near complete resolution of left suprahilar mass and thoracic adenopathy.  There was interval sclerosis indicative of healing site of osseous metastasis.  Thoracic spine MRI on 10/22/2016 reveals sclerotic enhancing lesion at T7 vertebral body on the left compatible with metastasis. There was a healing fracture the right medial 10th rib.  PET scan on 11/12/2016 revealed a new 10 mm hypermetabolic left axillary lymph node (SUV 3.6).  There were no other sites of metabolically active disease.  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  Anemia work-up on 05/29/.2018 revealed the following normal labs:  Ferritin (219), iron sat (28%), TIBC (227), B12 (3055), and folate (6.8).  He has a history of left lower extremity DVT a few months after his cardiac surgery in 08/2014.  He has peripheral vascular disease s/p stent placement in 03/2015.  He is on a Plavix.  He has not had a colonoscopy in 30 years.  Symptomatically, he has an abscess involving the left thenar eminence.  He has chronic mid back discomfort.    Plan: 1.  Discuss interval PET scan.  Dramatic resolution of metabolically active disease (  complete response).  Left axillary node secondary to abscess. 2.  Discuss referral to [employer] clinical trial ([id number]):  Maintenance after first-line treatment; phase 3, randomized placebo-controlled, Rova-T/placebo and dexamethasone/placebo every 6 weeks; 2 cycles on and 1 off.  Patient interested. 3.  Surgical referral for left thenar eminence abscess with possible tendon sheath involvement. 4.  Discuss referral to radiation oncology.  Consider PCI. 5.  RTC after surgery.     Lequita Asal, MD  11/13/2016, 12:35 PM

## 2016-11-13 NOTE — ED Triage Notes (Signed)
Pt c/o left hand/wrist abscess for the last 6 days.  2 days ago was seen and drained at [facility].  Is currently taking doxycycline.

## 2016-11-14 ENCOUNTER — Telehealth: Payer: Self-pay | Admitting: Cardiovascular Disease

## 2016-11-14 ENCOUNTER — Telehealth: Payer: Self-pay | Admitting: *Deleted

## 2016-11-14 ENCOUNTER — Encounter: Payer: Self-pay | Admitting: *Deleted

## 2016-11-14 MED ORDER — CLOPIDOGREL BISULFATE 75 MG PO TABS: 75.0000 mg | ORAL_TABLET | Freq: Every day | ORAL | 3 refills | Status: DC

## 2016-11-14 NOTE — Telephone Encounter (Signed)
Patient returned call to the cancer center; placed on the phone with Honor Loh, FNP. Patient reports that he is "about the same" today; denies increased erythema, drainage, and presence of fever. Patient explained course our care yesterday; [facility] sent him directly to [facility] ED citing the fact that "[facility] doesn't do hands". Patient reports that abscess was drained, he was given IM ABX, and sent home on a 10 day course of Augmentin; patient to follow up PCP. Per patient report, he has not been seen by PCP in over a year, however he has called the office asking to been seen; awaiting return call. Gaspar Bidding advised patient that he had spoken directly with the hand provider on call today. Hand specialist agrees that patient needs to be seen for further evaluation; wants patient to come to the ED and have hand surgery consulted. Patient reports that he is unable to go to the ED today citing issues with reliable transportation. NP stressed the importance of prompt follow up, and asked patient to consider alternate means of transportation in order to be seen today. Mr. Harvill continues to advise that he cannot go today, however he could borrow a vehicle and go early tomorrow morning. Patient to call cancer center and let staff know when he is en route to the ED; staff to call [facility] ED to let them know that patient is coming, and that he will need to be seen by a hand specialist per request of Dr. Mike Gip.

## 2016-11-14 NOTE — Telephone Encounter (Signed)
°*  STAT* If patient is at the pharmacy, call can be transferred to refill team.   1. Which medications need to be refilled? (please list name of each medication and dose if known)  Plavix 75 mg po daily   2. Which pharmacy/location (including street and city if local pharmacy) is medication to be sent to?  [place]   3. Do they need a 30 day or 90 day supply? [place]

## 2016-11-14 NOTE — Telephone Encounter (Signed)
Refill Request Plavix 75 mg tablet. Please advise if ok to refill.

## 2016-11-14 NOTE — Telephone Encounter (Signed)
Dr. Mike Gip spoke with [facility] on-call [facility]) who advised that he agrees that patient needs to be seen by a hand specialist. Dr. Marry Guan recommended that we call the hand specialist on call for [facility]. Call placed to Silvestre Gunner, PA who advised that options for getting this patient were limited at this point. PA advised that patient be sent back to [facility], via the ED, where hand surgery would need to be consulted. Attempted to call patient and update him on plan of care; no answer. Called patient's wife; no answer. Finally able to speak with patient's mother [name] Derry) who was unaware where patient was at this time; she was asked to have patient return call to the [facility] ASAP for further instructions related to his care.

## 2016-11-14 NOTE — Telephone Encounter (Signed)
Refill appropriate and sent into requested pharmacy. Left message, ok per DPR, that Rx sent to [facility] [address].

## 2016-11-14 NOTE — Telephone Encounter (Signed)
Honor Loh NP called to follow up on the hand abscess that the patient was sent to [facility] yesterday, patient did not answer the phone. Message left for patient to call the clinic back either today or tomorrow. Dr. Mike Gip to speak to Earnestine Leys MD today, paged.

## 2016-11-14 NOTE — Telephone Encounter (Signed)
error 

## 2016-11-15 ENCOUNTER — Inpatient Hospital Stay (HOSPITAL_COMMUNITY)
Admission: EM | Admit: 2016-11-15 | Discharge: 2016-11-18 | DRG: 857 | Disposition: A | Discharge status: Home / Self Care | Payer: Medicaid Other | Attending: Internal Medicine | Admitting: Internal Medicine

## 2016-11-15 ENCOUNTER — Emergency Department (HOSPITAL_COMMUNITY): Payer: Medicaid Other

## 2016-11-15 ENCOUNTER — Emergency Department (HOSPITAL_COMMUNITY): Payer: Medicaid Other | Admitting: Certified Registered Nurse Anesthetist

## 2016-11-15 ENCOUNTER — Telehealth: Payer: Self-pay | Admitting: *Deleted

## 2016-11-15 ENCOUNTER — Encounter (HOSPITAL_COMMUNITY): Payer: Self-pay

## 2016-11-15 ENCOUNTER — Encounter (HOSPITAL_COMMUNITY)
Admission: EM | Disposition: A | Discharge status: Home / Self Care | Payer: Self-pay | Source: Home / Self Care | Attending: Internal Medicine

## 2016-11-15 DIAGNOSIS — Z9221 Personal history of antineoplastic chemotherapy: Secondary | ICD-10-CM

## 2016-11-15 DIAGNOSIS — C3490 Malignant neoplasm of unspecified part of unspecified bronchus or lung: Secondary | ICD-10-CM

## 2016-11-15 DIAGNOSIS — Z888 Allergy status to other drugs, medicaments and biological substances status: Secondary | ICD-10-CM

## 2016-11-15 DIAGNOSIS — Z8349 Family history of other endocrine, nutritional and metabolic diseases: Secondary | ICD-10-CM

## 2016-11-15 DIAGNOSIS — Z801 Family history of malignant neoplasm of trachea, bronchus and lung: Secondary | ICD-10-CM

## 2016-11-15 DIAGNOSIS — Z8249 Family history of ischemic heart disease and other diseases of the circulatory system: Secondary | ICD-10-CM | POA: Diagnosis not present

## 2016-11-15 DIAGNOSIS — I739 Peripheral vascular disease, unspecified: Secondary | ICD-10-CM | POA: Diagnosis present

## 2016-11-15 DIAGNOSIS — L02512 Cutaneous abscess of left hand: Secondary | ICD-10-CM | POA: Diagnosis not present

## 2016-11-15 DIAGNOSIS — T451X5A Adverse effect of antineoplastic and immunosuppressive drugs, initial encounter: Secondary | ICD-10-CM | POA: Diagnosis present

## 2016-11-15 DIAGNOSIS — Z886 Allergy status to analgesic agent status: Secondary | ICD-10-CM

## 2016-11-15 DIAGNOSIS — M25539 Pain in unspecified wrist: Secondary | ICD-10-CM | POA: Diagnosis present

## 2016-11-15 DIAGNOSIS — I252 Old myocardial infarction: Secondary | ICD-10-CM

## 2016-11-15 DIAGNOSIS — Z91041 Radiographic dye allergy status: Secondary | ICD-10-CM

## 2016-11-15 DIAGNOSIS — D6481 Anemia due to antineoplastic chemotherapy: Secondary | ICD-10-CM | POA: Diagnosis present

## 2016-11-15 DIAGNOSIS — Z79899 Other long term (current) drug therapy: Secondary | ICD-10-CM

## 2016-11-15 DIAGNOSIS — L089 Local infection of the skin and subcutaneous tissue, unspecified: Secondary | ICD-10-CM

## 2016-11-15 DIAGNOSIS — T148XXA Other injury of unspecified body region, initial encounter: Secondary | ICD-10-CM

## 2016-11-15 DIAGNOSIS — Y838 Other surgical procedures as the cause of abnormal reaction of the patient, or of later complication, without mention of misadventure at the time of the procedure: Secondary | ICD-10-CM | POA: Diagnosis present

## 2016-11-15 DIAGNOSIS — Z86718 Personal history of other venous thrombosis and embolism: Secondary | ICD-10-CM

## 2016-11-15 DIAGNOSIS — I10 Essential (primary) hypertension: Secondary | ICD-10-CM | POA: Diagnosis present

## 2016-11-15 DIAGNOSIS — Z89422 Acquired absence of other left toe(s): Secondary | ICD-10-CM | POA: Diagnosis not present

## 2016-11-15 DIAGNOSIS — Z951 Presence of aortocoronary bypass graft: Secondary | ICD-10-CM

## 2016-11-15 DIAGNOSIS — Z7902 Long term (current) use of antithrombotics/antiplatelets: Secondary | ICD-10-CM

## 2016-11-15 DIAGNOSIS — F17210 Nicotine dependence, cigarettes, uncomplicated: Secondary | ICD-10-CM | POA: Diagnosis present

## 2016-11-15 DIAGNOSIS — I2510 Atherosclerotic heart disease of native coronary artery without angina pectoris: Secondary | ICD-10-CM | POA: Diagnosis present

## 2016-11-15 DIAGNOSIS — K219 Gastro-esophageal reflux disease without esophagitis: Secondary | ICD-10-CM | POA: Diagnosis present

## 2016-11-15 DIAGNOSIS — T814XXA Infection following a procedure, initial encounter: Secondary | ICD-10-CM | POA: Diagnosis present

## 2016-11-15 DIAGNOSIS — G894 Chronic pain syndrome: Secondary | ICD-10-CM | POA: Diagnosis present

## 2016-11-15 DIAGNOSIS — L02414 Cutaneous abscess of left upper limb: Secondary | ICD-10-CM | POA: Diagnosis present

## 2016-11-15 DIAGNOSIS — G629 Polyneuropathy, unspecified: Secondary | ICD-10-CM

## 2016-11-15 DIAGNOSIS — G893 Neoplasm related pain (acute) (chronic): Secondary | ICD-10-CM | POA: Diagnosis present

## 2016-11-15 DIAGNOSIS — E785 Hyperlipidemia, unspecified: Secondary | ICD-10-CM | POA: Diagnosis present

## 2016-11-15 DIAGNOSIS — C7951 Secondary malignant neoplasm of bone: Secondary | ICD-10-CM | POA: Diagnosis present

## 2016-11-15 DIAGNOSIS — R110 Nausea: Secondary | ICD-10-CM

## 2016-11-15 HISTORY — PX: INCISION AND DRAINAGE ABSCESS: SHX5864

## 2016-11-15 LAB — CBC WITH DIFFERENTIAL/PLATELET
Basophils Absolute: 0.1 10*3/uL (ref 0.0–0.1)
Basophils Relative: 1 %
EOS PCT: 2 %
Eosinophils Absolute: 0.1 10*3/uL (ref 0.0–0.7)
HCT: 31.4 % — ABNORMAL LOW (ref 39.0–52.0)
HEMOGLOBIN: 10.8 g/dL — AB (ref 13.0–17.0)
LYMPHS ABS: 1.2 10*3/uL (ref 0.7–4.0)
LYMPHS PCT: 25 %
MCH: 34.1 pg — AB (ref 26.0–34.0)
MCHC: 34.4 g/dL (ref 30.0–36.0)
MCV: 99.1 fL (ref 78.0–100.0)
Monocytes Absolute: 0.3 10*3/uL (ref 0.1–1.0)
Monocytes Relative: 6 %
NEUTROS ABS: 3.1 10*3/uL (ref 1.7–7.7)
NEUTROS PCT: 66 %
Platelets: 173 10*3/uL (ref 150–400)
RBC: 3.17 MIL/uL — AB (ref 4.22–5.81)
RDW: 16.7 % — ABNORMAL HIGH (ref 11.5–15.5)
WBC: 4.7 10*3/uL (ref 4.0–10.5)

## 2016-11-15 LAB — BASIC METABOLIC PANEL
Anion gap: 7 (ref 5–15)
BUN: 5 mg/dL — AB (ref 6–20)
CHLORIDE: 102 mmol/L (ref 101–111)
CO2: 23 mmol/L (ref 22–32)
Calcium: 8.4 mg/dL — ABNORMAL LOW (ref 8.9–10.3)
Creatinine, Ser: 0.78 mg/dL (ref 0.61–1.24)
GFR calc Af Amer: >60 mL/min (ref >60)
GFR calc non Af Amer: >60 mL/min (ref >60)
GLUCOSE: 120 mg/dL — AB (ref 65–99)
POTASSIUM: 4.1 mmol/L (ref 3.5–5.1)
Sodium: 132 mmol/L — ABNORMAL LOW (ref 135–145)

## 2016-11-15 LAB — C-REACTIVE PROTEIN: CRP: 1.3 mg/dL — ABNORMAL HIGH (ref <1.0)

## 2016-11-15 LAB — SEDIMENTATION RATE: Sed Rate: 92 mm/hr — ABNORMAL HIGH (ref 0–16)

## 2016-11-15 SURGERY — INCISION AND DRAINAGE, ABSCESS
Anesthesia: General | Site: Wrist | Laterality: Left

## 2016-11-15 MED ORDER — LACTATED RINGERS IV SOLN
INTRAVENOUS | Status: DC
  Administered 2016-11-15: 19:00:00 via INTRAVENOUS

## 2016-11-15 MED ORDER — FENTANYL CITRATE (PF) 250 MCG/5ML IJ SOLN
INTRAMUSCULAR | Status: DC | PRN
  Administered 2016-11-15 (×3): 25 ug via INTRAVENOUS

## 2016-11-15 MED ORDER — ONDANSETRON HCL 4 MG/2ML IJ SOLN: 4.0000 mg | Freq: Four times a day (QID) | INTRAMUSCULAR | Status: DC | PRN

## 2016-11-15 MED ORDER — PROMETHAZINE HCL 25 MG RE SUPP: 25.0000 mg | Freq: Four times a day (QID) | RECTAL | Status: DC | PRN

## 2016-11-15 MED ORDER — DOCUSATE SODIUM 100 MG PO CAPS
100.0000 mg | ORAL_CAPSULE | Freq: Two times a day (BID) | ORAL | Status: DC
  Administered 2016-11-15 – 2016-11-17 (×5): 100 mg via ORAL
  Filled 2016-11-15 (×6): qty 1

## 2016-11-15 MED ORDER — CALCIUM + D3 600-200 MG-UNIT PO TABS: ORAL_TABLET | Freq: Three times a day (TID) | ORAL | Status: DC

## 2016-11-15 MED ORDER — PIPERACILLIN-TAZOBACTAM 3.375 G IVPB
3.3750 g | Freq: Three times a day (TID) | INTRAVENOUS | Status: DC
  Administered 2016-11-15 – 2016-11-16 (×2): 3.375 g via INTRAVENOUS
  Filled 2016-11-15 (×3): qty 50

## 2016-11-15 MED ORDER — MIDAZOLAM HCL 2 MG/2ML IJ SOLN
INTRAMUSCULAR | Status: AC
  Filled 2016-11-15: qty 2

## 2016-11-15 MED ORDER — OXYCODONE HCL 5 MG PO TABS
ORAL_TABLET | ORAL | Status: AC
  Administered 2016-11-15: 10 mg via ORAL
  Filled 2016-11-15: qty 2

## 2016-11-15 MED ORDER — LIDOCAINE HCL (CARDIAC) 20 MG/ML IV SOLN
INTRAVENOUS | Status: DC | PRN
  Administered 2016-11-15: 60 mg via INTRATRACHEAL

## 2016-11-15 MED ORDER — VANCOMYCIN HCL IN DEXTROSE 1-5 GM/200ML-% IV SOLN
1000.0000 mg | Freq: Two times a day (BID) | INTRAVENOUS | Status: DC
  Administered 2016-11-15 – 2016-11-18 (×6): 1000 mg via INTRAVENOUS
  Filled 2016-11-15 (×7): qty 200

## 2016-11-15 MED ORDER — METOPROLOL TARTRATE 25 MG PO TABS
25.0000 mg | ORAL_TABLET | Freq: Two times a day (BID) | ORAL | Status: DC
  Administered 2016-11-15 – 2016-11-18 (×6): 25 mg via ORAL
  Filled 2016-11-15 (×6): qty 1

## 2016-11-15 MED ORDER — DIPHENHYDRAMINE HCL 25 MG PO CAPS: 25.0000 mg | ORAL_CAPSULE | Freq: Two times a day (BID) | ORAL | Status: DC | PRN

## 2016-11-15 MED ORDER — FENTANYL CITRATE (PF) 100 MCG/2ML IJ SOLN
25.0000 ug | INTRAMUSCULAR | Status: DC | PRN
  Administered 2016-11-15 (×3): 50 ug via INTRAVENOUS

## 2016-11-15 MED ORDER — OXYCODONE HCL 5 MG PO TABS
5.0000 mg | ORAL_TABLET | ORAL | Status: DC | PRN
Start: 2016-11-15 — End: 2016-11-16

## 2016-11-15 MED ORDER — DEXTROSE 5 % IV SOLN
500.0000 mg | Freq: Four times a day (QID) | INTRAVENOUS | Status: DC | PRN
  Filled 2016-11-15: qty 5

## 2016-11-15 MED ORDER — 0.9 % SODIUM CHLORIDE (POUR BTL) OPTIME
TOPICAL | Status: DC | PRN
  Administered 2016-11-15: 1000 mL

## 2016-11-15 MED ORDER — GADOBENATE DIMEGLUMINE 529 MG/ML IV SOLN
10.0000 mL | Freq: Once | INTRAVENOUS | Status: AC
  Administered 2016-11-15: 10 mL via INTRAVENOUS

## 2016-11-15 MED ORDER — OXYCODONE HCL ER 10 MG PO T12A: 10.0000 mg | EXTENDED_RELEASE_TABLET | Freq: Three times a day (TID) | ORAL | Status: DC | PRN

## 2016-11-15 MED ORDER — FENTANYL CITRATE (PF) 100 MCG/2ML IJ SOLN
INTRAMUSCULAR | Status: AC
  Administered 2016-11-15: 50 ug via INTRAVENOUS
  Filled 2016-11-15: qty 2

## 2016-11-15 MED ORDER — CEFAZOLIN SODIUM-DEXTROSE 2-4 GM/100ML-% IV SOLN
2.0000 g | INTRAVENOUS | Status: AC
  Administered 2016-11-15: 2 g via INTRAVENOUS
  Filled 2016-11-15: qty 100

## 2016-11-15 MED ORDER — VITAMIN C 500 MG PO TABS
1000.0000 mg | ORAL_TABLET | Freq: Every day | ORAL | Status: DC
  Administered 2016-11-16 – 2016-11-18 (×3): 1000 mg via ORAL
  Filled 2016-11-15 (×3): qty 2

## 2016-11-15 MED ORDER — PROPOFOL 10 MG/ML IV BOLUS
INTRAVENOUS | Status: DC | PRN
  Administered 2016-11-15: 200 mg via INTRAVENOUS

## 2016-11-15 MED ORDER — ALPRAZOLAM 0.5 MG PO TABS
0.5000 mg | ORAL_TABLET | Freq: Four times a day (QID) | ORAL | Status: DC | PRN
  Administered 2016-11-18: 0.5 mg via ORAL
  Filled 2016-11-15: qty 1

## 2016-11-15 MED ORDER — CHLORHEXIDINE GLUCONATE 4 % EX LIQD: 60.0000 mL | Freq: Once | CUTANEOUS | Status: DC

## 2016-11-15 MED ORDER — PANTOPRAZOLE SODIUM 40 MG PO TBEC
40.0000 mg | DELAYED_RELEASE_TABLET | Freq: Every day | ORAL | Status: DC
  Administered 2016-11-16 – 2016-11-18 (×3): 40 mg via ORAL
  Filled 2016-11-15 (×3): qty 1

## 2016-11-15 MED ORDER — POVIDONE-IODINE 10 % EX SWAB: 2.0000 "application " | Freq: Once | CUTANEOUS | Status: DC

## 2016-11-15 MED ORDER — METHOCARBAMOL 500 MG PO TABS
500.0000 mg | ORAL_TABLET | Freq: Four times a day (QID) | ORAL | Status: DC | PRN
  Administered 2016-11-15 – 2016-11-18 (×6): 500 mg via ORAL
  Filled 2016-11-15 (×5): qty 1

## 2016-11-15 MED ORDER — OXYCODONE HCL 5 MG PO TABS
5.0000 mg | ORAL_TABLET | ORAL | Status: DC | PRN
  Administered 2016-11-15 – 2016-11-18 (×11): 10 mg via ORAL
  Filled 2016-11-15 (×10): qty 2

## 2016-11-15 MED ORDER — ATORVASTATIN CALCIUM 40 MG PO TABS
40.0000 mg | ORAL_TABLET | Freq: Every day | ORAL | Status: DC
  Administered 2016-11-16 – 2016-11-18 (×3): 40 mg via ORAL
  Filled 2016-11-15 (×3): qty 1

## 2016-11-15 MED ORDER — ONDANSETRON HCL 4 MG PO TABS: 4.0000 mg | ORAL_TABLET | Freq: Four times a day (QID) | ORAL | Status: DC | PRN

## 2016-11-15 MED ORDER — OXYCODONE HCL ER 10 MG PO T12A
10.0000 mg | EXTENDED_RELEASE_TABLET | Freq: Three times a day (TID) | ORAL | Status: DC
  Administered 2016-11-15 – 2016-11-18 (×8): 10 mg via ORAL
  Filled 2016-11-15 (×8): qty 1

## 2016-11-15 MED ORDER — LACTATED RINGERS IV SOLN
INTRAVENOUS | Status: DC | PRN
  Administered 2016-11-15: 19:00:00 via INTRAVENOUS

## 2016-11-15 MED ORDER — BUPIVACAINE HCL (PF) 0.25 % IJ SOLN
INTRAMUSCULAR | Status: AC
  Filled 2016-11-15: qty 10

## 2016-11-15 MED ORDER — METHOCARBAMOL 500 MG PO TABS
ORAL_TABLET | ORAL | Status: AC
  Administered 2016-11-15: 500 mg via ORAL
  Filled 2016-11-15: qty 1

## 2016-11-15 MED ORDER — FENTANYL CITRATE (PF) 250 MCG/5ML IJ SOLN
INTRAMUSCULAR | Status: AC
  Filled 2016-11-15: qty 5

## 2016-11-15 MED ORDER — SODIUM CHLORIDE 0.9 % IR SOLN
Status: DC | PRN
  Administered 2016-11-15: 3000 mL

## 2016-11-15 MED ORDER — LISINOPRIL 5 MG PO TABS
5.0000 mg | ORAL_TABLET | Freq: Every day | ORAL | Status: DC
  Administered 2016-11-17: 5 mg via ORAL
  Filled 2016-11-15 (×2): qty 1

## 2016-11-15 MED ORDER — MORPHINE SULFATE (PF) 4 MG/ML IV SOLN
1.0000 mg | INTRAVENOUS | Status: DC | PRN
  Administered 2016-11-15 – 2016-11-16 (×3): 1 mg via INTRAVENOUS
  Filled 2016-11-15 (×3): qty 1

## 2016-11-15 MED ORDER — LACTATED RINGERS IV SOLN
INTRAVENOUS | Status: DC
Start: 2016-11-15 — End: 2016-11-18

## 2016-11-15 SURGICAL SUPPLY — 37 items
BANDAGE ACE 3X5.8 VEL STRL LF (GAUZE/BANDAGES/DRESSINGS) ×3 IMPLANT
BANDAGE ACE 4X5 VEL STRL LF (GAUZE/BANDAGES/DRESSINGS) IMPLANT
BNDG GAUZE ELAST 4 BULKY (GAUZE/BANDAGES/DRESSINGS) ×3 IMPLANT
CORDS BIPOLAR (ELECTRODE) ×3 IMPLANT
CUFF TOURNIQUET SINGLE 18IN (TOURNIQUET CUFF) ×3 IMPLANT
DRSG ADAPTIC 3X8 NADH LF (GAUZE/BANDAGES/DRESSINGS) IMPLANT
GAUZE PACKING IODOFORM 1/4X15 (GAUZE/BANDAGES/DRESSINGS) ×3 IMPLANT
GAUZE SPONGE 4X4 12PLY STRL (GAUZE/BANDAGES/DRESSINGS) ×3 IMPLANT
GAUZE XEROFORM 1X8 LF (GAUZE/BANDAGES/DRESSINGS) IMPLANT
GLOVE BIOGEL M 8.0 STRL (GLOVE) ×3 IMPLANT
GLOVE SS BIOGEL STRL SZ 8 (GLOVE) ×1 IMPLANT
GLOVE SUPERSENSE BIOGEL SZ 8 (GLOVE) ×2
GLOVE SURG SS PI 6.5 STRL IVOR (GLOVE) ×3 IMPLANT
GOWN STRL REUS W/ TWL LRG LVL3 (GOWN DISPOSABLE) ×1 IMPLANT
GOWN STRL REUS W/ TWL XL LVL3 (GOWN DISPOSABLE) ×2 IMPLANT
GOWN STRL REUS W/TWL LRG LVL3 (GOWN DISPOSABLE) ×2
GOWN STRL REUS W/TWL XL LVL3 (GOWN DISPOSABLE) ×4
KIT BASIN OR (CUSTOM PROCEDURE TRAY) ×3 IMPLANT
KIT ROOM TURNOVER OR (KITS) ×3 IMPLANT
MANIFOLD NEPTUNE II (INSTRUMENTS) ×3 IMPLANT
NEEDLE HYPO 25GX1X1/2 BEV (NEEDLE) IMPLANT
NS IRRIG 1000ML POUR BTL (IV SOLUTION) ×3 IMPLANT
PACK ORTHO EXTREMITY (CUSTOM PROCEDURE TRAY) ×3 IMPLANT
PAD ARMBOARD 7.5X6 YLW CONV (MISCELLANEOUS) ×3 IMPLANT
PAD CAST 4YDX4 CTTN HI CHSV (CAST SUPPLIES) ×1 IMPLANT
PADDING CAST COTTON 4X4 STRL (CAST SUPPLIES) ×2
SCRUB BETADINE 4OZ XXX (MISCELLANEOUS) ×3 IMPLANT
SPONGE LAP 4X18 X RAY DECT (DISPOSABLE) ×3 IMPLANT
SWAB CULTURE ESWAB REG 1ML (MISCELLANEOUS) IMPLANT
SWAB CULTURE LIQ STUART DBL (MISCELLANEOUS) ×3 IMPLANT
SYR CONTROL 10ML LL (SYRINGE) IMPLANT
TOWEL OR 17X24 6PK STRL BLUE (TOWEL DISPOSABLE) IMPLANT
TOWEL OR 17X26 10 PK STRL BLUE (TOWEL DISPOSABLE) ×3 IMPLANT
TUBE CONNECTING 12'X1/4 (SUCTIONS) ×1
TUBE CONNECTING 12X1/4 (SUCTIONS) ×2 IMPLANT
TUBING CYSTO DISP (UROLOGICAL SUPPLIES) ×3 IMPLANT
YANKAUER SUCT BULB TIP NO VENT (SUCTIONS) ×3 IMPLANT

## 2016-11-15 NOTE — Telephone Encounter (Addendum)
Patient came to clinic today. NP assessed wound.  Patient continues to c/o pain; symptomatically he is no better from when he was previously assessed by Dr. Mike Gip. Patient with dressing in place; removed by NP; purulent drainage noted. Patient reports that pain is actually worse. Discussed plan of care; patient to [facility] ED and will need to have hand specialist consulted (as directed by Jacqulynn Cadet, [place] yesterday). Gaspar Bidding, NP called and spoke with [facility] ED charge nurse and advised that patient was coming and provided report. NP stressed that patient has already been seen in the [facility] ED, in the cancer center and by general surgery. He was sent to hand specialist at [facility] who sent patient back to the the [facility] ED; patient Rx'd ABX and sent home. General surgeon concerned with the extent of the infection and advises that patient will likely need to be taken to the OR for probable tendon sheath involvement. ED charge nurse aware that patient is en route at this time.

## 2016-11-15 NOTE — Op Note (Signed)
NAMEERICSON, NAFZIGER            ACCOUNT NO.:  [id number]  MEDICAL RECORD NO.:  [id number]  LOCATION:                               FACILITY:  [facility]  PHYSICIAN:  Satira Anis. Lean Jaeger, M.D.DATE OF BIRTH:  1960/11/01  DATE OF PROCEDURE: DATE OF DISCHARGE:  11/18/2016                              OPERATIVE REPORT   PREOPERATIVE DIAGNOSIS:  Left wrist abscess.  POSTOPERATIVE DIAGNOSIS:  Left wrist abscess.  PROCEDURE:  Irrigation and debridement of left hypothenar space abscess.  SURGEON:  Satira Anis. Amedeo Plenty, M.D.  ASSISTANT:  None.  COMPLICATIONS:  None.  ANESTHESIA:  General.  TOURNIQUET TIME:  Zero.  INDICATIONS:  A 56 year old male with the above-mentioned diagnosis.  He has been to multiple locations, had cursory I and D's, but it is not going onto quiescence.  I would recommend a thorough I and D, admission, IV antibiotics, and PT hydrotherapy for wet-to-dry dressing changes following the excisional debridement.  He understands this and desires to proceed.  OPERATION IN DETAIL:  The patient was seen by myself and Anesthesia, taken to the operative theater, underwent smooth induction of general anesthetic.  Hibiclens pre-scrub followed by Betadine scrub and paint was accomplished.  Following this, the patient then underwent elevation of the arm and evaluation of the wound.  I then performed irrigation and debridement.  This was an I and D of a deep abscess with curette, knife, blade, and scissor, 1 cm depth, 3 to 4 cm in width.  I excised 1 to 1.5 cm area of necrotic tissue, cultured the patient for aerobic and anaerobic cultures.  There were no complicating features.  I then irrigated with 3 L of saline.  I then packed the wound with iodoform gauze.  He tolerated this well. Sterile dressing was applied.  There were no complicating features.  Going forward, we will plan for PT hydrotherapy, wet-to-dry dressing changes, and I would expect 2 to 3 weeks for the wound  healing by secondary intention.  We will await cultures and move forward accordingly.  We appreciate the consultant's help.     Satira Anis. Amedeo Plenty, M.D.     [facility]  D:  11/15/2016  T:  11/15/2016  Job:  [id number]

## 2016-11-15 NOTE — ED Provider Notes (Signed)
[place] DEPT Provider Note   CSN: [id number] Arrival date & time: 11/15/16  1124     History   Chief Complaint Chief Complaint  Patient presents with  . Wound Infection    HPI [name] is a 56 y.o. male.  Patient presents to the emergency department with ongoing left hand wound and lymphangitis. He is here for orthopedic consultation today. Patient has been seen in emergency department prior. He has had an incision and drainage. He has been on doxycycline and Augmentin and continues to take these. Overall he states that the swelling and streaking has improved however today there was more redness directly around the wound. Last chemotherapy treatment was approximately 3 weeks ago. No fevers, vomiting. Onset of symptoms acute. Course is waxing and waning.      Past Medical History:  Diagnosis Date  . Anemia   . CAD (coronary artery disease)    a. 08/2014 Inf STEMI/CABG x 3 (LIMA->LAD, VG->Diag, RIMA->RCA).  . DVT, recurrent, lower extremity, acute, left ([place]) 2016  . Hyperlipidemia   . Hypertension   . Hypertensive heart disease   . Lung cancer ([place])   . PAD (peripheral artery disease) ([place])    a. 03/2015 Periph Angio: short occlusion of L pop w/ evidence of embolization into the DP->5.0x50 mm Inova self-expanding stent;  b. 04/08/2015 ABI: R 1.12, L 0.99.  . Tobacco abuse   . Toe amputation status, left [facility]) 07/09/2016   2017    Patient Active Problem List   Diagnosis Date Noted  . Abscess of left hand 11/13/2016  . Thoracic spine pain 10/28/2016  . Antineoplastic chemotherapy induced anemia 10/28/2016  . Encounter for antineoplastic chemotherapy 09/10/2016  . Charcot's joint of foot, right 08/21/2016  . GERD (gastroesophageal reflux disease) 08/21/2016  . Myocardial infarction ([place]) 08/21/2016  . Hyponatremia 08/19/2016  . Hypocalcemia 08/19/2016  . Hypomagnesemia 08/19/2016  . Routine history and physical examination of adult 08/09/2016  . Cancer  of upper lobe of left lung ([place]) 07/31/2016  . Cancer related pain 07/31/2016  . Cough 07/31/2016  . Goals of care, counseling/discussion 07/26/2016  . Acute CVA (cerebrovascular accident) ([place]) 07/26/2016  . Small cell lung cancer ([place]) 07/19/2016  . Postobstructive pneumonia 07/17/2016  . Bone metastasis ([place]) 07/12/2016  . Lymphadenopathy, mediastinal 07/02/2016  . Weight loss 07/02/2016  . Night sweats 07/02/2016  . Chronic pain in left foot 03/21/2016  . Chronic pain syndrome 03/21/2016  . Neuropathic pain 03/21/2016  . Iron deficiency anemia 01/08/2016  . Dry gangrene ([place]) 01/02/2016  . Osteopenia of both feet 12/10/2015  . Peripheral neuropathy 06/12/2015  . Hyperlipidemia, unspecified   . PAD (peripheral artery disease) ([place]) 03/29/2015  . Hypertension 10/05/2014  . Pure hypercholesterolemia 10/05/2014  . Acute sinusitis, unspecified 09/14/2014  . Rash and other nonspecific skin eruption 09/14/2014  . S/P CABG x 3 08/25/2014  . Status post aorto-coronary artery bypass graft 08/25/2014  . History of MI (myocardial infarction) 08/24/2014  . CAD (coronary artery disease), native coronary artery 08/24/2014  . Acute myocardial infarction of inferior wall ([place]) 08/24/2014  . Right ankle pain 07/20/2014  . Pain in joint involving ankle and foot 07/20/2014  . Bilateral foot pain 07/06/2014  . Tobacco use disorder 07/06/2014  . Convulsions ([place]) 07/06/2014  . Preventative health care 07/06/2014  . Elevated blood-pressure reading without diagnosis of hypertension 07/06/2014  . Pain in soft tissues of limb 07/06/2014    Past Surgical History:  Procedure Laterality Date  . CARDIAC CATHETERIZATION  N/A 08/24/2014   Procedure: Left Heart Cath and Coronary Angiography;  Surgeon: Isaias Cowman, MD;  Location: [place] CV LAB;  Service: Cardiovascular;  Laterality: N/A;  . CARDIAC CATHETERIZATION N/A 08/24/2014   Procedure: Coronary Stent Intervention;  Surgeon: Isaias Cowman, MD;  Location: [place] CV LAB;  Service: Cardiovascular;  Laterality: N/A;  . CORONARY ARTERY BYPASS GRAFT N/A 08/25/2014   Procedure: CORONARY ARTERY BYPASS GRAFTING (CABG), ON PUMP, TIMES THREE, USING BILATERAL MAMMARY ARTERIES, RIGHT GREATER SAPHENOUS VEIN HARVESTED ENDOSCOPICALLY;  Surgeon: Melrose Nakayama, MD;  Location: [place];  Service: Open Heart Surgery;  Laterality: N/A;  . PERIPHERAL VASCULAR CATHETERIZATION N/A 03/30/2015   Procedure: Abdominal Aortogram w/Lower Extremity;  Surgeon: Wellington Hampshire, MD;  Location: [place] CV LAB;  Service: Cardiovascular;  Laterality: N/A;  . PORTA CATH INSERTION N/A 07/27/2016   Procedure: Glori Luis Cath Insertion;  Surgeon: Algernon Huxley, MD;  Location: [place] CV LAB;  Service: Cardiovascular;  Laterality: N/A;  . TEE WITHOUT CARDIOVERSION N/A 08/25/2014   Procedure: TRANSESOPHAGEAL ECHOCARDIOGRAM (TEE);  Surgeon: Melrose Nakayama, MD;  Location: [place];  Service: Open Heart Surgery;  Laterality: N/A;       Home Medications    Prior to Admission medications   Medication Sig Start Date End Date Taking? Authorizing Provider  amoxicillin-clavulanate (AUGMENTIN) 875-125 MG tablet Take 1 tablet by mouth 2 (two) times daily. 11/13/16   Ashley Murrain, NP  atorvastatin (LIPITOR) 40 MG tablet TAKE 1 TABLET BY MOUTH DAILY 08/02/16   Wellington Hampshire, MD  chlorpheniramine-HYDROcodone [facility] PENNKINETIC ER) 10-8 MG/5ML SUER Take 5 mLs by mouth every 12 (twelve) hours as needed for cough. 08/21/16   Lloyd Huger, MD  clopidogrel (PLAVIX) 75 MG tablet Take 1 tablet (75 mg total) by mouth daily. 11/14/16   Wellington Hampshire, MD  diphenhydrAMINE (BENADRYL) 25 mg capsule Take 25 mg by mouth 2 (two) times daily as needed for allergies.     [provider]  doxycycline (VIBRAMYCIN) 100 MG capsule Take 1 capsule (100 mg total) by mouth 2 (two) times daily. 11/11/16   Nance Pear, MD  lidocaine-prilocaine (EMLA) cream  Apply to affected area once 07/31/16   Lequita Asal, MD  lisinopril (PRINIVIL,ZESTRIL) 5 MG tablet TAKE 1 TABLET BY MOUTH DAILY 06/11/16   Rogelia Mire, NP  LORazepam (ATIVAN) 0.5 MG tablet Take 1 tablet (0.5 mg total) by mouth every 6 (six) hours as needed (Nausea or vomiting). 10/23/16   Lequita Asal, MD  metoprolol tartrate (LOPRESSOR) 25 MG tablet Take 1 tablet (25 mg total) by mouth 2 (two) times daily. 07/22/15   Minna Merritts, MD  omeprazole (PRILOSEC) 20 MG capsule Take 20 mg by mouth daily.    [provider]  ondansetron (ZOFRAN) 8 MG tablet Take 1 tablet (8 mg total) by mouth 2 (two) times daily as needed for refractory nausea / vomiting. Start on day 3 after carboplatin chemo. 07/31/16   Lequita Asal, MD  oxyCODONE (OXY IR/ROXICODONE) 5 MG immediate release tablet Take 1 tablet (5 mg total) by mouth every 4 (four) hours as needed for severe pain. 11/13/16   Lequita Asal, MD  oxyCODONE (OXYCONTIN) 10 mg 12 hr tablet Take 1 tablet (10 mg total) by mouth every 8 (eight) hours as needed. 10/23/16   Lequita Asal, MD  promethazine (PHENERGAN) 25 MG suppository Place 1 suppository (25 mg total) rectally every 6 (six) hours as needed for nausea or  vomiting. 10/02/16   Lequita Asal, MD    Family History Family History  Problem Relation Age of Onset  . Hypertension Father   . Lung cancer Father   . Heart attack Mother 62       STENT  . Hyperlipidemia Mother   . Heart attack Brother   . Heart attack Brother   . Breast cancer Sister   . Heart attack Sister   . Colon cancer Paternal Grandfather     Social History Social History  Substance Use Topics  . Smoking status: Current Every Day Smoker    Packs/day: 0.50    Types: Cigarettes  . Smokeless tobacco: Never Used     Comment: 4cigs day down from 2ppd  . Alcohol use 0.0 oz/week     Comment: Sometimes on w/e.     Allergies   Amitriptyline; Contrast media [iodinated diagnostic  agents]; Nsaids; and Ibuprofen   Review of Systems Review of Systems  Constitutional: Negative for fever.  Gastrointestinal: Negative for nausea and vomiting.  Musculoskeletal: Negative for myalgias.  Skin: Positive for color change and wound.     Physical Exam Updated Vital Signs BP (!) 145/94 (BP Location: Right Arm)   Pulse (!) 108   Temp 98 F (36.7 C) (Oral)   Resp 14   Ht 5\' 11"  (1.803 m)   Wt 66.2 kg (146 lb)   SpO2 95%   BMI 20.36 kg/m   Physical Exam  Constitutional: He appears well-developed and well-nourished.  HENT:  Head: Normocephalic and atraumatic.  Eyes: Conjunctivae are normal.  Neck: Normal range of motion. Neck supple.  Pulmonary/Chest: No respiratory distress.  Musculoskeletal:  Open wound noted at the ulnar aspect of the proximal hand distal forearm. There is erythema around an open wound. No active drainage. There is very light streaking noted on the volar forearm. Mild associated swelling.  Neurological: He is alert.  Skin: Skin is warm and dry.  Psychiatric: He has a normal mood and affect.  Nursing note and vitals reviewed.    ED Treatments / Results  Labs (all labs ordered are listed, but only abnormal results are displayed) Labs Reviewed  CBC WITH DIFFERENTIAL/PLATELET - Abnormal; Notable for the following:       Result Value   RBC 3.17 (*)    Hemoglobin 10.8 (*)    HCT 31.4 (*)    MCH 34.1 (*)    RDW 16.7 (*)    All other components within normal limits  BASIC METABOLIC PANEL - Abnormal; Notable for the following:    Sodium 132 (*)    Glucose, Bld 120 (*)    BUN 5 (*)    Calcium 8.4 (*)    All other components within normal limits  SEDIMENTATION RATE - Abnormal; Notable for the following:    Sed Rate 92 (*)    All other components within normal limits  C-REACTIVE PROTEIN - Abnormal; Notable for the following:    CRP 1.3 (*)    All other components within normal limits    Procedures Procedures (including critical care  time)  Medications Ordered in ED Medications - No data to display   Initial Impression / Assessment and Plan / ED Course  I have reviewed the triage vital signs and the nursing notes.  Pertinent labs & imaging results that were available during my care of the patient were reviewed by me and considered in my medical decision making (see chart for details).     Patient seen and  examined. Will await ortho consult.   Vital signs reviewed and are as follows: BP (!) 145/94 (BP Location: Right Arm)   Pulse (!) 108   Temp 98 F (36.7 C) (Oral)   Resp 14   Ht 5\' 11"  (1.803 m)   Wt 66.2 kg (146 lb)   SpO2 95%   BMI 20.36 kg/m   Ortho has ordered MRI, plans for I&D in OR later tonight.   Labs reviewed. IMTS to admit.   Final Clinical Impressions(s) / ED Diagnoses   Final diagnoses:  Wound infection   Admit to teaching service.  New Prescriptions New Prescriptions   No medications on file     Carlisle Cater, Hershal Coria 11/15/16 1514    Veryl Speak, MD 11/15/16 [phone]

## 2016-11-15 NOTE — ED Triage Notes (Signed)
Per Pt, Pt was sent over here to see PA Orion Crook after about ten days of an infection to his left hand along with swelling and streaking to the left arm. Pt denies fevers. Reports being compliant with medications.

## 2016-11-15 NOTE — ED Notes (Signed)
Dellis Filbert PA at bedside.

## 2016-11-15 NOTE — Anesthesia Procedure Notes (Signed)
Procedure Name: LMA Insertion Date/Time: 11/15/2016 7:06 PM Performed by: Valetta Fuller Pre-anesthesia Checklist: Patient identified, Emergency Drugs available, Suction available and Patient being monitored Patient Re-evaluated:Patient Re-evaluated prior to induction Oxygen Delivery Method: Circle system utilized Preoxygenation: Pre-oxygenation with 100% oxygen Induction Type: IV induction Ventilation: Mask ventilation without difficulty LMA: LMA inserted LMA Size: 4.0 Number of attempts: 1 Placement Confirmation: positive ETCO2 Tube secured with: Tape Dental Injury: Teeth and Oropharynx as per pre-operative assessment

## 2016-11-15 NOTE — ED Notes (Signed)
Dinner tray ordered.

## 2016-11-15 NOTE — Op Note (Deleted)
  The note originally documented on this encounter has been moved the the encounter in which it belongs.  

## 2016-11-15 NOTE — Progress Notes (Signed)
Patient ID: [name], male   DOB: 12-23-1960, 56 y.o.   MRN: [id number] Patient has been seen and examined. This is quite a history.  His had multiple evaluations by multiple providers and multiple cursory I and D's. Unfortunately none of this is lent itself towards a solution to his infectious process. He came today from [place] and is now certainly a candidate for surgical debridement. I would recommend surgical debridement, admission, IV antibiotics and aggressive treatment. I discussed with him all issues do's and don'ts. We'll proceed accordingly.  [name] M.D.

## 2016-11-15 NOTE — Transfer of Care (Signed)
Immediate Anesthesia Transfer of Care Note  Patient: [name]  Procedure(s) Performed: Procedure(s): INCISION AND DRAINAGE ABSCESS OF LEFT WRIST (Left)  Patient Location: PACU  Anesthesia Type:General  Level of Consciousness: awake, alert  and oriented  Airway & Oxygen Therapy: Patient Spontanous Breathing  Post-op Assessment: Report given to RN and Post -op Vital signs reviewed and stable  Post vital signs: Reviewed and stable  Last Vitals:  Vitals:   11/15/16 1630 11/15/16 1943  BP: 115/86   Pulse: 90   Resp: 16   Temp:  (!) (P) 36.4 C    Last Pain:  Vitals:   11/15/16 1943  TempSrc:   PainSc: (P) 8          Complications: No apparent anesthesia complications

## 2016-11-15 NOTE — ED Notes (Signed)
Pt to xray

## 2016-11-15 NOTE — Anesthesia Preprocedure Evaluation (Signed)
Anesthesia Evaluation  Patient identified by MRN, date of birth, ID band Patient awake    Reviewed: Allergy & Precautions, NPO status , Patient's Chart, lab work & pertinent test results, reviewed documented beta blocker date and time   Airway Mallampati: I  TM Distance: >3 FB Neck ROM: Full    Dental  (+) Missing, Poor Dentition, Dental Advisory Given, Chipped   Pulmonary shortness of breath, Current Smoker,  Stage 4 lung Ca   + rhonchi        Cardiovascular hypertension, Pt. 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  Neuromuscular disease CVA, No Residual Symptoms    GI/Hepatic Neg liver ROS, GERD  Medicated and Controlled,  Endo/Other  negative endocrine ROS  Renal/GU negative Renal ROS     Musculoskeletal  (+) Arthritis ,   Abdominal   Peds  Hematology  (+) anemia ,   Anesthesia Other Findings EF 50%, denies CP, sees cards 01/2017  Reproductive/Obstetrics                             Anesthesia Physical Anesthesia Plan  ASA: III  Anesthesia Plan: General   Post-op Pain Management:    Induction: Intravenous  PONV Risk Score and Plan: 1 and Ondansetron and Dexamethasone  Airway Management Planned: LMA  Additional Equipment: None  Intra-op Plan:   Post-operative Plan: Extubation in OR  Informed Consent: I have reviewed the patients History and Physical, chart, labs and discussed the procedure including the risks, benefits and alternatives for the proposed anesthesia with the patient or authorized representative who has indicated his/her understanding and acceptance.   Dental advisory given  Plan Discussed with: CRNA and Surgeon  Anesthesia Plan Comments:         Anesthesia Quick Evaluation

## 2016-11-15 NOTE — Progress Notes (Signed)
Pharmacy Antibiotic Note  Philip [name] is a 56 y.o. male admitted on 11/15/2016 with recurrent L hand abscess previously being treated at [facility] with doxy/augmentin, s/p repeat I&D today. Pharmacy has been consulted for vancomycin and zosyn dosing. Scr 0.78, est. crcl ~ 96 ml/min.  Plan: - Vancomycin 1g IV Q 12 hrs - zosyn 3.375g IV Q 8 hrs - monitor renal function, f/u cultures - vancomycin trough at steady state if indicated.  Height: 5\' 11"  (180.3 cm) Weight: 146 lb (66.2 kg) IBW/kg (Calculated) : 75.3  Temp (24hrs), Avg:97.8 F (36.6 C), Min:97.5 F (36.4 C), Max:98 F (36.7 C)   Recent Labs Lab 11/11/16 1737 11/15/16 1340  WBC 8.0 4.7  CREATININE 0.71 0.78    Estimated Creatinine Clearance: 96.5 mL/min (by C-G formula based on SCr of 0.78 mg/dL).    Allergies  Allergen Reactions  . Amitriptyline Other (See Comments)    Burning sensation all over  . Contrast Media [Iodinated Diagnostic Agents] Other (See Comments)    Pt states that he got "knots behind his ears"  . Nsaids Other (See Comments)    Blood in stools  . Ibuprofen Other (See Comments) and Nausea Only    Blood in stools  . Tape Rash and Other (See Comments)    PLEASE USE COBAN WRAP; THE PATIENT'S SKIN TEARS WHEN BANDAGES ARE REMOVED!!    Antimicrobials this admission: Vancomycin 8/2 >>  Zosyn 8/2 >>   Dose adjustments this admission:   Microbiology results: 8/2 abscess cx  Thank you for allowing pharmacy to be a part of this patient's care.  Maryanna Shape, PharmD, BCPS  Clinical Pharmacist  Pager: [phone]   11/15/2016 9:02 PM

## 2016-11-15 NOTE — Consult Note (Signed)
Reason for Consult:Wrist infection Referring Physician: D Delo  Philip [name] is an 56 y.o. male.  HPI: [name] has been suffering with a wrist infection for about 12d. He started having some pain and redness Sunday, July 22. It quickly worsened and he sought care at the ED at [name]. He underwent I&D on 7/29 and was started on Doxy. It worsened again and he saw his oncologist who sent him to orthopedics in [place] (I believe) who sent him to ED. He underwent repeat I&D and Augmentin was added to his abx regimen. Ortho was contacted by phone by [name] who was told they didn't do hands and suggested he see a hand surgeon in [name]. Pt advised to come to ED and was able to a day later 2/2 transportation issues. Today he notes that the forearm streaking erythema and hand redness and swelling are improved but the abscess area is worse again. He denies constitutional symptoms such as N/V, fevers, chills, or sweats. Had a prior history of a similar episode on same wrist about 4-6 months ago that responded to abx and did not need surgical intervention. His medical history is complicated by active chemotherapy for lung cancer (recent PET scan reassuring) and heart disease on Plavix. He is RHD.  Past Medical History:  Diagnosis Date  . Anemia   . CAD (coronary artery disease)    a. 08/2014 Inf STEMI/CABG x 3 (LIMA->LAD, VG->Diag, RIMA->RCA).  . DVT, recurrent, lower extremity, acute, left ([place]) 2016  . Hyperlipidemia   . Hypertension   . Hypertensive heart disease   . Lung cancer ([name])   . PAD (peripheral artery disease) ([place])    a. 03/2015 Periph Angio: short occlusion of L pop w/ evidence of embolization into the DP->5.0x50 mm Inova self-expanding stent;  b. 04/08/2015 ABI: R 1.12, L 0.99.  . Tobacco abuse   . Toe amputation status, left [place] [name]) 07/09/2016   2017    Past Surgical History:  Procedure Laterality Date  . CARDIAC CATHETERIZATION N/A 08/24/2014   Procedure: Left Heart Cath and  Coronary Angiography;  Surgeon: Isaias Cowman, MD;  Location: [name];  Service: Cardiovascular;  Laterality: N/A;  . CARDIAC CATHETERIZATION N/A 08/24/2014   Procedure: Coronary Stent Intervention;  Surgeon: Isaias Cowman, MD;  Location: [place] [name];  Service: Cardiovascular;  Laterality: N/A;  . CORONARY ARTERY BYPASS GRAFT N/A 08/25/2014   Procedure: CORONARY ARTERY BYPASS GRAFTING (CABG), ON PUMP, TIMES THREE, USING BILATERAL MAMMARY ARTERIES, RIGHT GREATER SAPHENOUS VEIN HARVESTED ENDOSCOPICALLY;  Surgeon: Melrose Nakayama, MD;  Location: [name];  Service: Open Heart Surgery;  Laterality: N/A;  . PERIPHERAL VASCULAR CATHETERIZATION N/A 03/30/2015   Procedure: Abdominal Aortogram w/Lower Extremity;  Surgeon: Wellington Hampshire, MD;  Location: [place] [name];  Service: Cardiovascular;  Laterality: N/A;  . PORTA CATH INSERTION N/A 07/27/2016   Procedure: Glori Luis Cath Insertion;  Surgeon: Algernon Huxley, MD;  Location: [name];  Service: Cardiovascular;  Laterality: N/A;  . TEE WITHOUT CARDIOVERSION N/A 08/25/2014   Procedure: TRANSESOPHAGEAL ECHOCARDIOGRAM (TEE);  Surgeon: Melrose Nakayama, MD;  Location: [place];  Service: Open Heart Surgery;  Laterality: N/A;    Family History  Problem Relation Age of Onset  . Hypertension Father   . Lung cancer Father   . Heart attack Mother 24       STENT  . Hyperlipidemia Mother   . Heart attack Brother   . Heart attack Brother   . Breast cancer Sister   .  Heart attack Sister   . Colon cancer Paternal Grandfather     Social History:  reports that he has been smoking Cigarettes.  He has been smoking about 0.50 packs per day. He has never used smokeless tobacco. He reports that he drinks alcohol. He reports that he does not use drugs.  Allergies:  Allergies  Allergen Reactions  . Amitriptyline Other (See Comments)    Burning sensation all over  . Contrast Media [Iodinated Diagnostic Agents] Other  (See Comments)    Pt states that he got "knots behind his ears".  . Nsaids Other (See Comments)    Other reaction(s): Other (See Comments) Reaction:  Blood in stool  Reaction:  Blood in stool   . Ibuprofen Other (See Comments) and Nausea Only    Blood in stools    Medications: I have reviewed the patient's current medications.  No results found for this or any previous visit (from the past 48 hour(s)).  No results found.  Review of Systems  Constitutional: Negative for weight loss.  HENT: Negative for ear discharge, ear pain, hearing loss and tinnitus.   Eyes: Negative for blurred vision, double vision, photophobia and pain.  Respiratory: Negative for cough, sputum production and shortness of breath.   Cardiovascular: Negative for chest pain.  Gastrointestinal: Negative for abdominal pain, nausea and vomiting.  Genitourinary: Negative for dysuria, flank pain, frequency and urgency.  Musculoskeletal: Positive for joint pain (Left wrist). Negative for back pain, falls, myalgias and neck pain.  Neurological: Negative for dizziness, tingling, sensory change, focal weakness, loss of consciousness and headaches.  Endo/Heme/Allergies: Does not bruise/bleed easily.  Psychiatric/Behavioral: Negative for depression, memory loss and substance abuse. The patient is not nervous/anxious.    Blood pressure 128/81, pulse 82, temperature 98 F (36.7 C), temperature source Oral, resp. rate 16, height 5\' 11"  (1.803 m), weight 66.2 kg (146 lb), SpO2 99 %. Physical Exam  Constitutional: He appears well-developed and well-nourished. No distress.  HENT:  Head: Normocephalic.  Eyes: Conjunctivae are normal. Right eye exhibits no discharge. Left eye exhibits no discharge. No scleral icterus.  Cardiovascular: Normal rate and regular rhythm.   Respiratory: Effort normal. No respiratory distress.  Musculoskeletal:  UEx shoulder, elbow, wrist, digits- Fluctuant mass ulnar wrist, able to express purulence,  moderate TTP, no instability, no blocks to motion, no epitrochlear or axillary nodes palpable  Sens  Ax/R/M/U intact  Mot   Ax/ R/ PIN/ M/ AIN/ U intact  Rad 2+   Neurological: He is alert.  Skin: Skin is warm and dry. He is not diaphoretic.  Psychiatric: He has a normal mood and affect. His behavior is normal.    Assessment/Plan: Left wrist abscess -- Labs and stat MRI ordered. Pt will need I&D in OR with Dr. Amedeo Plenty this evening. Medicine to admit given history.    Lisette Abu, PA-C Orthopedic Surgery [phone] 11/15/2016, 1:36 PM

## 2016-11-15 NOTE — Op Note (Signed)
  See dictation#[id number]  Status post irrigation debridement deep abscess.  I would recommend PT for hydrotherapy and daily wet-to-dry dressing changes with WaterPik whirlpool. I would expect 2-3 weeks for the area to healing by secondary intention healing  We'll wait cultures. All questions have encouraged and answered.  Wilmore Holsomback M.D.

## 2016-11-15 NOTE — Anesthesia Postprocedure Evaluation (Signed)
Anesthesia Post Note  Patient: Philip [name]  Procedure(s) Performed: Procedure(s) (LRB): INCISION AND DRAINAGE ABSCESS OF LEFT WRIST (Left)     Patient location during evaluation: PACU Anesthesia Type: General Level of consciousness: awake and alert Pain management: pain level controlled Vital Signs Assessment: post-procedure vital signs reviewed and stable Respiratory status: spontaneous breathing, nonlabored ventilation, respiratory function stable and patient connected to nasal cannula oxygen Cardiovascular status: blood pressure returned to baseline and stable Postop Assessment: no signs of nausea or vomiting Anesthetic complications: no    Last Vitals:  Vitals:   11/15/16 2015 11/15/16 2118  BP:  (!) 147/78  Pulse:  73  Resp:  18  Temp: 36.7 C 36.6 C    Last Pain:  Vitals:   11/15/16 2118  TempSrc: Oral  PainSc:                  Traevon Sahan Pen

## 2016-11-15 NOTE — H&P (Signed)
Date: 11/15/2016               Patient Name:  Philip [name] MRN: [id number]  DOB: 12-12-1960 Age / Sex: 56 y.o., male   PCP: [name], MD         Medical Service: Internal Medicine Teaching Service         Attending Physician: Dr. Roseanne Kaufman, MD    First Contact: Dr.  Pager: [phone]  Second Contact: Dr. 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  Pt describes it feeling like a splinter was in his hand 12 days ago, he said it began to get more swollen and painful the next few days.  He noticed pus developing on Thursday and two days later noticed red streaks going up his arm, was concerned and went to [facility] for evaluation on 7/29 .  There he had an I and D procedure and was sent home on doxycycline.  Pt said the abscess reformed and increased in size from before so he went to [place] on 7/31 for re evaluation on the advice of his orthopedic doctor where he had another I and D procedure and was sent home on augmentin.  Pt returns this evening with improvement in red streaking up arm but recurrence of abscess and recommendation from his oncologist to optimize him so he will be able to enroll in a clinical trial in two weeks.  In the ED the patient was evaluated by the hand surgeon, he received an MRI of the hand that showed superficial cellulitis was taken to the OR where I and D was performed and cultures were taken.  Pt denies chest pain, fever, sob, n/v, diarrhea, abdominal pain and feels fine other  than the pain in his hand. Pt has not received chemotherapy in 3 weeks and is on   Meds:  Current Facility-Administered Medications for the 11/15/16 encounter [facility] Encounter)  Medication  . ondansetron (ZOFRAN-ODT) disintegrating tablet 8 mg   Current Meds  Medication Sig  . amoxicillin-clavulanate (AUGMENTIN) 875-125 MG tablet Take 1 tablet by mouth 2 (two) times daily. (Patient taking differently: Take 1 tablet by mouth 2 (two) times daily. For 10 days)  . atorvastatin (LIPITOR) 40 MG tablet TAKE 1 TABLET BY MOUTH DAILY (Patient taking differently: Take 40 mg by mouth once a day)  . Calcium Carb-Cholecalciferol (CALCIUM + D3 PO) Take 1 tablet by mouth 3 (three) times daily.  . clopidogrel (PLAVIX) 75 MG tablet Take 1 tablet (75 mg total) by mouth daily.  . diphenhydrAMINE (BENADRYL) 25 mg capsule Take 25 mg by mouth 2 (two) times daily as needed for allergies.   Marland Kitchen doxycycline (VIBRAMYCIN) 100 MG capsule Take 1 capsule (100 mg total) by mouth 2 (two) times daily. (Patient taking differently: Take 100 mg by mouth 2 (two) times daily. For 10 days)  . lidocaine-prilocaine (EMLA) cream Apply to affected area once (Patient taking differently: Apply 1 application topically See admin instructions. TO AFFECTED AREA  AS DIRECTED)  . LORazepam (ATIVAN) 0.5 MG tablet Take 1 tablet (0.5 mg total) by mouth every 6 (six) hours as needed (Nausea or vomiting).  . metoprolol tartrate (LOPRESSOR) 25 MG tablet Take 1 tablet (25 mg total) by mouth 2 (two) times daily.  Marland Kitchen omeprazole (PRILOSEC) 20 MG capsule Take 20 mg by mouth daily.  Marland Kitchen oxyCODONE (OXY IR/ROXICODONE) 5 MG immediate release tablet Take 1 tablet (5 mg total) by mouth every 4 (four) hours as needed for severe pain.  Marland Kitchen oxyCODONE (OXYCONTIN) 10 mg 12 hr tablet Take 1 tablet (10 mg total) by mouth every 8 (eight) hours as needed. (Patient taking differently: Take 10 mg by mouth every 8 (eight) hours as needed (for pain). )  . promethazine (PHENERGAN) 25  MG suppository Place 1 suppository (25 mg total) rectally every 6 (six) hours as needed for nausea or vomiting.     Allergies: Allergies as of 11/15/2016 - Review Complete 11/15/2016  Allergen Reaction Noted  . Amitriptyline Other (See Comments) 03/21/2016  . Contrast media [iodinated diagnostic agents] Other (See Comments) 07/01/2014  . Nsaids Other (See Comments) 08/24/2014  . Ibuprofen Other (See Comments) and Nausea Only 07/06/2014  . Tape Rash and Other (See Comments) 11/15/2016   Past Medical History:  Diagnosis Date  . Anemia   . CAD (coronary artery disease)    a. 08/2014 Inf STEMI/CABG x 3 (LIMA->LAD, VG->Diag, RIMA->RCA).  . DVT, recurrent, lower extremity, acute, left ([place]) 2016  . Hyperlipidemia   . Hypertension   . Hypertensive heart disease   . Lung cancer ([place])   . PAD (peripheral artery disease) ([place])    a. 03/2015 Periph Angio: short occlusion of L pop w/ evidence of embolization into the DP->5.0x50 mm Inova self-expanding stent;  b. 04/08/2015 ABI: R 1.12, L 0.99.  . Tobacco abuse   . Toe amputation status, left [facility]) 07/09/2016   2017    Family History:  Family History  Problem Relation Age of Onset  . Hypertension Father   . Lung cancer Father   . Heart attack Mother 53       STENT  . Hyperlipidemia Mother   . Heart attack Brother   . Heart attack Brother   . Breast cancer Sister   . Heart attack Sister   . Colon cancer Paternal Grandfather     Social History:  Social History   Social History  . Marital status: Married    Spouse name: N/A  . Number of children: N/A  . Years of education: N/A   Occupational History  . Not on file.   Social History Main Topics  . Smoking status: Current Every Day Smoker    Packs/day: 0.50    Types: Cigarettes  . Smokeless tobacco: Never Used     Comment: 4cigs day down from 2ppd  . Alcohol use 0.0 oz/week     Comment: Sometimes on w/e.  . Drug use: No  . Sexual activity: Not on file   Other Topics  Concern  . Not on file   Social History Narrative  . No narrative on file    Review of Systems: Review of Systems  Constitutional: Positive for weight loss. Negative for chills, diaphoresis and fever.  HENT: Negative for ear pain, hearing loss and tinnitus.   Eyes: Negative for blurred vision, double vision, photophobia and pain.  Respiratory: Positive for cough. Negative for hemoptysis, sputum production and shortness of breath.   Cardiovascular: Negative for chest pain, palpitations, orthopnea and leg  swelling.  Gastrointestinal: Negative for abdominal pain, blood in stool, constipation, diarrhea, heartburn, nausea and vomiting.  Genitourinary: Negative for dysuria and urgency.  Musculoskeletal: Positive for back pain and joint pain. Negative for myalgias.  Skin: Negative for itching and rash.  Neurological: Negative for dizziness, tingling, tremors, seizures, loss of consciousness and headaches.  Endo/Heme/Allergies: Bruises/bleeds easily.  Psychiatric/Behavioral: Negative for depression, substance abuse and suicidal ideas.    Physical Exam: Blood pressure (!) 144/69, pulse 78, temperature (!) 97.5 F (36.4 C), resp. rate 15, height 5\' 11"  (1.803 m), weight 146 lb (66.2 kg), SpO2 98 %.  Physical Exam  Constitutional: He is oriented to person, place, and time. He appears well-developed and well-nourished.  HENT:  Head: Normocephalic and atraumatic.  Neck: Normal range of motion. Neck supple. No JVD present.  Cardiovascular: Normal rate, regular rhythm and normal heart sounds.  Exam reveals no gallop and no friction rub.   No murmur heard. Pulmonary/Chest: Effort normal. He has wheezes (mild expiratory wheezes ). He has no rales. He exhibits no tenderness.  Abdominal: Soft. Bowel sounds are normal. He exhibits no distension. There is no tenderness. There is no guarding.  Neurological: He is alert and oriented to person, place, and time.  Skin: Skin is warm and dry.    Psychiatric: He has a normal mood and affect. His behavior is normal.    EKG: n/a  CXR: n/a  Assessment & Plan by Problem: Principal Problem:   Abscess of left hand Active Problems:   CAD (coronary artery disease), native coronary artery   Hypertension   Small cell lung cancer (HCC)   Abscess of wrist  56 yo male pmh small cell lung cancer stage 4, recurrent DVT's, CAD, HTN, HLD, presents with recurrent L hand abscess.    Recurrent L hand abscess: likely due to incomplete source control combined with pt being immunosuppressed. pt received I and D in OR under general anesthesia, for more complete drainage and better source control.  Hand surgeon following.  -Vancomycin/Zosyn orders placed by surgeon -Cultures taken during surgery to help narrow antibiotic coverage -Pain control with oxycodone 10mg  ever 8 hours with PRN's for breakthrough pain  Small cell lung cancer: stage 4 diagnosed in April, last chemotherapy treatment 4 rounds so far with carboplatin, etoposide 3 weeks ago and xgeva  On 10/30/16, with good reduction in tumor burden, however repeat PET showed new 17EY hypermetabolic axillary lymph node.    -optimize pt medically for chance at new clinical trial -BMP, CBC    HTN: pt well controlled on home meds recent Blood pressure (!) 147/78 -continue lisinopril 5mg  and lopressor 25mg   CAD: inf MI 2016 s/p CABGx3 -pt asymptomatic -continue lisinopril, lopressor, atorvastatin    Dispo: Admit patient to Inpatient with expected length of stay greater than 2 midnights.  Signed: Katherine Roan, MD 11/15/2016, 8:07 PM  Vickki Muff MD PGY-1 Internal Medicine Pager # [phone]

## 2016-11-16 ENCOUNTER — Encounter (HOSPITAL_COMMUNITY): Payer: Self-pay

## 2016-11-16 NOTE — Progress Notes (Signed)
Subjective: 1 Day Post-Op Procedure(s) (LRB): INCISION AND DRAINAGE ABSCESS OF LEFT WRIST (Left) Patient reports pain as controlled at this juncture. He denies nausea, vomiting, fever or chills. He has been nothing by mouth today and does states that of course is quite hungry and thirsty. He tolerated hydrotherapy today without difficulties.   Objective: Vital signs in last 24 hours: Temp:  [97.5 F (36.4 C)-98 F (36.7 C)] 98 F (36.7 C) (08/03 1154) Pulse Rate:  [66-78] 66 (08/03 1154) Resp:  [14-20] 18 (08/03 1154) BP: (108-172)/(67-81) 108/67 (08/03 1154) SpO2:  [97 %-100 %] 97 % (08/03 1154)  Intake/Output from previous day: 08/02 0701 - 08/03 0700 In: 500 [I.V.:500] Out: 50 [Blood:50] Intake/Output this shift: No intake/output data recorded.   Recent Labs  11/15/16 1340  HGB 10.8*    Recent Labs  11/15/16 1340  WBC 4.7  RBC 3.17*  HCT 31.4*  PLT 173    Recent Labs  11/15/16 1340  NA 132*  K 4.1  CL 102  CO2 23  BUN 5*  CREATININE 0.78  GLUCOSE 120*  CALCIUM 8.4*   No results for input(s): LABPT, INR in the last 72 hours. Results for orders placed or performed during the hospital encounter of 11/15/16  Aerobic/Anaerobic Culture (surgical/deep wound)     Status: None (Preliminary result)   Collection Time: 11/15/16  7:17 PM  Result Value Ref Range Status   Specimen Description ABSCESS LEFT WRIST  Final   Special Requests NONE  Final   Gram Stain   Final    RARE WBC PRESENT,BOTH PMN AND MONONUCLEAR RARE GRAM POSITIVE COCCI IN PAIRS    Culture CULTURE REINCUBATED FOR BETTER GROWTH  Final   Report Status PENDING  Incomplete   Focused examination of the left upper extremity shows that the dressings are removed his wound is packed he has surrounding erythema no advanced ascending cellulitis is present his swelling is improved overall digital range of motion tag Norvasc is intact he has no obvious purulence present.  Assessment/Plan: 1 Day Post-Op  Procedure(s) (LRB): INCISION AND DRAINAGE ABSCESS OF LEFT WRIST (Left) We will continue IV antibiotics and close observation of his cultures. Once cultures are finalized we will look towards by mouth antibiotics for home basis. During the interim we will continue daily hydrotherapy and teach the patient and his wife wet-to-dry dressing changes be performed once discharged home. We will advance his diet as he tolerates at this point in time.  Dawnetta Copenhaver L 11/16/2016, 4:35 PM

## 2016-11-16 NOTE — Progress Notes (Signed)
Hydrotherapy Cancellation Note  Patient Details Name: Philip [name] MRN: [id number] DOB: 06-16-1960   Cancelled Treatment:    Reason Eval/Treat Not Completed: Other (comment). Pt reports he thinks he may be having more hand surgery today. Will clarify and return later.   [place] 11/16/2016, 10:56 AM Tat Momoli

## 2016-11-16 NOTE — Progress Notes (Signed)
Physical Therapy Wound Treatment Patient Details  Name: Philip [name] MRN: [id number] Date of Birth: 10/25/1960  Today's Date: 11/16/2016 Time: 7017-7939 Time Calculation (min): 23 min  Subjective  Subjective: Pt reports he is hungry Patient and Family Stated Goals: To heal wrist Date of Onset: 11/04/16 Prior Treatments: Bedside I&D's, surgical I&D, antibiotics  Pain Score: Pain Score:  Pt premedicated with IV meds. Pain 5.  Wound Assessment  Wound / Incision (Open or Dehisced) 11/16/16 Incision - Open Wrist Left;Medial (Active)  Dressing Type Compression wrap;Gauze (Comment);Moist to dry 11/16/2016  3:32 PM  Dressing Changed Changed 11/16/2016  3:32 PM  Dressing Status Clean;Dry;Intact 11/16/2016  3:32 PM  Dressing Change Frequency Daily 11/16/2016  3:32 PM  Site / Wound Assessment Pink;Yellow 11/16/2016  3:32 PM  % Wound base Red or Granulating 85% 11/16/2016  3:32 PM  % Wound base Yellow/Fibrinous Exudate 15% 11/16/2016  3:32 PM  % Wound base Black/Eschar 0% 11/16/2016  3:32 PM  % Wound base Other/Granulation Tissue (Comment) 0% 11/16/2016  3:32 PM  Peri-wound Assessment Erythema (non-blanchable);Edema 11/16/2016  3:32 PM  Wound Length (cm) 1 cm 11/16/2016  3:32 PM  Wound Width (cm) 1 cm 11/16/2016  3:32 PM  Wound Depth (cm) 0.2 cm 11/16/2016  3:32 PM  Margins Unattached edges (unapproximated) 11/16/2016  3:32 PM  Closure None 11/16/2016  3:32 PM  Drainage Amount Minimal 11/16/2016  3:32 PM  Drainage Description Serosanguineous 11/16/2016  3:32 PM  Non-staged Wound Description Partial thickness 11/16/2016  3:32 PM  Treatment Hydrotherapy (Pulse lavage);Packing (Saline gauze) 11/16/2016  3:32 PM   Hydrotherapy Pulsed lavage therapy - wound location: lt medial wrist Pulsed Lavage with Suction (psi): 8 psi Pulsed Lavage with Suction - Normal Saline Used: 1000 mL Pulsed Lavage Tip: Tip with splash shield   Wound Assessment and Plan  Wound Therapy - Assess/Plan/Recommendations Wound Therapy - Clinical  Statement: Pt presents to hydrotherapy with open wrist wound s/p surgical debridement. Can benefit from hydrotherapy to promote healing and decr bioburden. Instructed wife how to perform dressing changes at home and she was able to verbalize good understanding. Wound Therapy - Functional Problem List: Decr use of lt hand Factors Delaying/Impairing Wound Healing: Infection - systemic/local;Multiple medical problems;Tobacco use Hydrotherapy Plan: Debridement;Dressing change;Patient/family education;Pulsatile lavage with suction Wound Therapy - Frequency: 6X / week Wound Therapy - Follow Up Recommendations: Other (comment) (Per hand surgeon) Wound Plan: See above  Wound Therapy Goals- Improve the function of patient's integumentary system by progressing the wound(s) through the phases of wound healing (inflammation - proliferation - remodeling) by: Decrease Necrotic Tissue to: 0 Decrease Necrotic Tissue - Progress: Goal set today Increase Granulation Tissue to: 100 Increase Granulation Tissue - Progress: Goal set today Goals/treatment plan/discharge plan were made with and agreed upon by patient/family: Yes Time For Goal Achievement: 7 days Wound Therapy - Potential for Goals: Good  Goals will be updated until maximal potential achieved or discharge criteria met.  Discharge criteria: when goals achieved, discharge from hospital, MD decision/surgical intervention, no progress towards goals, refusal/missing three consecutive treatments without notification or medical reason.  GP     Shary Decamp [place] 11/16/2016, 3:39 PM [employer] PT [phone]

## 2016-11-16 NOTE — Progress Notes (Signed)
Patient asked if he could go off floor to cafeteria. He said that the Doctor said he could walk. Advised patient against it. Educated that patients are to remain on the floor especially right after surgery. Educated patient on the dangers of leaving the floor and that if something were to happen, medical help may not be near. Explained to patient that he could ambulate on the unit as much as he liked. Patient ignored medical advice and left floor.

## 2016-11-16 NOTE — Progress Notes (Addendum)
Subjective: Today, patient was seen and examined at bedside. He is POD #1 from surgical debridement of an abscess on his left hand. He reports significant pain in his left hand but decreased erythema and swelling of that forearm. He denies any chest pain, difficulty breathing, fevers, or new onset weakness.  Objective: Vital signs in last 24 hours: Vitals:   11/15/16 2118 11/16/16 0455 11/16/16 0819 11/16/16 1154  BP: (!) 147/78 129/68 112/76 108/67  Pulse: 73 73 70 66  Resp: 18 20  18   Temp: 97.9 F (36.6 C) 97.7 F (36.5 C)  98 F (36.7 C)  TempSrc: Oral Oral  Oral  SpO2: 99% 100% 100% 97%  Weight:      Height:      BP 108/67 (BP Location: Right Arm)   Pulse 66   Temp 98 F (36.7 C) (Oral)   Resp 18   Ht 5\' 11"  (1.803 m)   Wt 66.2 kg (146 lb)   SpO2 97%   BMI 20.36 kg/m   General appearance: alert, cooperative and no distress Head: Normocephalic, without obvious abnormality, atraumatic Lungs: Inspiratory wheezes heard bilaterally Heart: regular rate and rhythm, S1, S2 normal, no murmur, click, rub or gallop Extremities: bandage on left hand extending to mid-forarm  Skin: Skin color, texture, turgor normal. No rashes or lesions  Assessment/Plan:  Status Post Surgical Debridement of Left Hand Abscess  Patient is currently POD #1 from debridement of a recurrent left hand abscess. Peri-operative Wound gram stain revealed rare G+ cocci therefore broad spectrum coverage with zosyn is no longer necessary and it was discontinued. Given his immunocompromised state on chemotherapy will continue on Vancomycin.  Will deescalate based on culture and sensitivity results. Pain control with 10mg  Oxycodone Q8hr and 5-10mg  Oxycodone every 3hrs for breakthrough pain. Patient reports the surgeon told him me may take him back for further debridement today. Will continue to hold NPO until able to clarify.   Small Cell Lung Cancer  Last round of chemotherapy was 10/30/16. Will monitor CBC and  BMP closely for signs of neutropenia and electrolyte abnormalities    HTN  Stable and well controlled on home medications -Continue home Lisinopril   CAD  -continue atorvastatin and metoprolol  Disposition: Will discharge once patient per hand surgery's recommendations and once culture and sensitivity results are available. Expected discharge in 2-3 days.   This is a Careers information officer Note.  The care of the patient was discussed with PhilipBoswell  and the assessment and plan formulated with their assistance.  Please see their attached note for official documentation of the daily encounter.   LOS: 1 day   [name], Juanda Chance, Medical Student 11/16/2016, 2:51 PM   Attestation for Student Documentation: I edited the above note as I felt appropriate.  I personally was present and performed or re-performed the history, physical exam and medical decision-making activities of this service and have verified that the service and findings are accurately documented in the student's note.  Maryellen Pile, MD 11/16/2016, 4:34 PM  Internal Medicine Attending  Date: 11/16/2016  Patient name: Philip [name] Medical record number: [id number] Date of birth: [birth year] Age: 56 y.o. Gender: male  I saw and evaluated the patient. I reviewed the sub-intern/resident's note by Philip [name]/Philip [name] and I agree with the subintern/resident's findings and plans as documented in their progress note.  Please see my H&P dated 11/16/2016 and attached to Dr. Maudie Flakes H&P dated 11/15/2016 for the specifics my evaluation, assessment, and plan from earlier in  the day.

## 2016-11-16 NOTE — Plan of Care (Signed)
Problem: Pain Managment: Goal: General experience of comfort will improve Patient voices understanding of pain scale and calls for pain when needed

## 2016-11-17 LAB — VANCOMYCIN, TROUGH: VANCOMYCIN TR: 14 ug/mL — AB (ref 15–20)

## 2016-11-17 NOTE — Progress Notes (Signed)
Physical Therapy Wound Treatment Patient Details  Name: Philip [name] MRN: [id number] Date of Birth: [birth year]  Today's Date: 11/17/2016 Time:  -     Subjective  Subjective: No new complaints.  Patient and Family Stated Goals: To heal wrist Date of Onset: 11/04/16 Prior Treatments: Bedside I&D's, surgical I&D, antibiotics  Pain Score: Pain Score: 4   Wound Assessment  Wound / Incision (Open or Dehisced) 11/16/16 Incision - Open Wrist Left;Medial (Active)  Dressing Type Compression wrap;Gauze (Comment);Moist to dry 11/17/2016  2:17 PM  Dressing Changed Changed 11/17/2016  2:17 PM  Dressing Status Clean;Dry;Intact 11/17/2016  2:17 PM  Dressing Change Frequency Daily 11/17/2016  2:17 PM  Site / Wound Assessment Pink;Yellow 11/17/2016  2:17 PM  % Wound base Red or Granulating 85% 11/17/2016  2:17 PM  % Wound base Yellow/Fibrinous Exudate 15% 11/17/2016  2:17 PM  % Wound base Black/Eschar 0% 11/17/2016  2:17 PM  % Wound base Other/Granulation Tissue (Comment) 0% 11/16/2016  3:32 PM  Peri-wound Assessment Erythema (non-blanchable);Edema 11/17/2016  2:17 PM  Wound Length (cm) 1 cm 11/16/2016  3:32 PM  Wound Width (cm) 1 cm 11/16/2016  3:32 PM  Wound Depth (cm) 0.2 cm 11/16/2016  3:32 PM  Margins Unattached edges (unapproximated) 11/17/2016  2:17 PM  Closure None 11/17/2016  2:17 PM  Drainage Amount Scant 11/17/2016  2:17 PM  Drainage Description Serosanguineous 11/17/2016  2:17 PM  Non-staged Wound Description Partial thickness 11/17/2016  2:17 PM  Treatment Hydrotherapy (Pulse lavage);Packing (Saline gauze) 11/17/2016  2:17 PM   Hydrotherapy Pulsed lavage therapy - wound location: lt medial wrist Pulsed Lavage with Suction (psi): 8 psi Pulsed Lavage with Suction - Normal Saline Used: 1000 mL Pulsed Lavage Tip: Tip with splash shield   Wound Assessment and Plan  Wound Therapy - Assess/Plan/Recommendations Wound Therapy - Clinical Statement: Pt presents to hydrotherapy with open wrist wound s/p surgical  debridement. Can benefit from hydrotherapy to promote healing and decr bioburden. Instructed wife how to perform dressing changes at home and she was able to verbalize good understanding. Wound Therapy - Functional Problem List: Decr use of lt hand Factors Delaying/Impairing Wound Healing: Infection - systemic/local;Multiple medical problems;Tobacco use Hydrotherapy Plan: Debridement;Dressing change;Patient/family education;Pulsatile lavage with suction Wound Therapy - Frequency: 6X / week Wound Therapy - Follow Up Recommendations: Other (comment) (per hand surgeon) Wound Plan: See above  Wound Therapy Goals- Improve the function of patient's integumentary system by progressing the wound(s) through the phases of wound healing (inflammation - proliferation - remodeling) by: Decrease Necrotic Tissue to: 0 Decrease Necrotic Tissue - Progress: Progressing toward goal Increase Granulation Tissue to: 100 Increase Granulation Tissue - Progress: Progressing toward goal Goals/treatment plan/discharge plan were made with and agreed upon by patient/family: Yes Time For Goal Achievement: 7 days Wound Therapy - Potential for Goals: Good  Goals will be updated until maximal potential achieved or discharge criteria met.  Discharge criteria: when goals achieved, discharge from hospital, MD decision/surgical intervention, no progress towards goals, refusal/missing three consecutive treatments without notification or medical reason.   Willow Ora 11/17/2016, 2:20 PM   Willow Ora, PTA, CLT Acute Rehab Services [phone] 11/17/16, 2:20 PM

## 2016-11-17 NOTE — Plan of Care (Signed)
Problem: Pain Managment: Goal: General experience of comfort will improve Patient voices understanding of pain scale and calls for medication when needed.

## 2016-11-17 NOTE — Progress Notes (Signed)
   Subjective:  Mr. Denherder is doing well this morning. Only complains of his IV site bothering him. Reports some soreness in his left hand but pain is well controlled. Denies any fevers, chills. Denies any erythema or warmth around his surgical site. Requesting to leave the floor and walk around the hospital.  Objective:  Vital signs in last 24 hours: Vitals:   11/16/16 1154 11/16/16 1958 11/17/16 0456 11/17/16 1005  BP: 108/67 117/61 114/66 128/68  Pulse: 66 70 65 64  Resp: 18 17 18    Temp: 98 F (36.7 C) 98.6 F (37 C) 98 F (36.7 C)   TempSrc: Oral Oral Oral   SpO2: 97% 99% 100%   Weight:      Height:       GENERAL- alert, co-operative, appears as stated age, not in any distress. CARDIAC- RRR, no murmurs, rubs or gallops. RESP- diffuse wheezes. ABDOMEN- Soft, nontender, bowel sounds present. SKIN- left hand with surgical bandage in place. No obvious erythema. No warmth. Did not remove bandaging.   Assessment/Plan:  Status Post Surgical Debridement of Left Hand Abscess  POD #2. Waiting on culture results to guide antibiotic therapy. Continue vanc and pain control.   Small Cell Lung Cancer  Last round of chemotherapy was 10/30/16.   HTN  Continue home medications  Dispo: Anticipated discharge in approximately 1 day(s).   Maryellen Pile, MD 11/17/2016, 12:15 PM Pager: [phone]

## 2016-11-17 NOTE — Progress Notes (Signed)
Patient ID: Philip [name], male   DOB: [birth year], 56 y.o.   MRN: [id number] Patient stable Await culture report and continue hydrotherapy MRI showed soft issue involvement only-NO osteo Madesyn Ast MD

## 2016-11-17 NOTE — Progress Notes (Signed)
Pharmacy Antibiotic Note  Philip [name] is a 56 y.o. male with small cell lung cancer stage 4 admitted on 11/15/2016 with recurrent L hand abscess s/p I&D on 8/2.  Pharmacy has been consulted for Vancomycin dosing.  Vancomycin trough this am is 14 on 1g IV every 12 hours - drawn appropriately 12 hours since last dose.  SCr is stable - last on 8/2. WBC is within normal limits.   Plan: Continue Vancomycin 1g IV every 12 hours.  Monitor renal function and adjust therapy as needed. Monitor clinical status.   Height: 5\' 11"  (180.3 cm) Weight: 146 lb (66.2 kg) IBW/kg (Calculated) : 75.3  Temp (24hrs), Avg:98.2 F (36.8 C), Min:98 F (36.7 C), Max:98.6 F (37 C)   Recent Labs Lab 11/11/16 1737 11/15/16 1340 11/17/16 0932  WBC 8.0 4.7  --   CREATININE 0.71 0.78  --   VANCOTROUGH  --   --  14*    Estimated Creatinine Clearance: 96.5 mL/min (by C-G formula based on SCr of 0.78 mg/dL).    Allergies  Allergen Reactions  . Amitriptyline Other (See Comments)    Burning sensation all over  . Contrast Media [Iodinated Diagnostic Agents] Other (See Comments)    Pt states that he got "knots behind his ears"  . Nsaids Other (See Comments)    Blood in stools  . Ibuprofen Other (See Comments) and Nausea Only    Blood in stools  . Tape Rash and Other (See Comments)    PLEASE USE COBAN WRAP; THE PATIENT'S SKIN TEARS WHEN BANDAGES ARE REMOVED!!    Antimicrobials this admission:  Zosyn 8/2 >>8/3 Vancomycin 8/2 >>  Dose adjustments this admission:    Microbiology results:  8/2 L-wrist abscess >> (gm stain: rare GPC pairs) re-incubated  Thank you for allowing pharmacy to be a part of this patient's care.  Sloan Leiter, PharmD, BCPS Clinical Pharmacist Clinical phone 11/17/2016 until 3:30PM [phone] After hours, please call #[phone] 11/17/2016 10:38 AM

## 2016-11-18 LAB — BASIC METABOLIC PANEL
Anion gap: 6 (ref 5–15)
BUN: <5 mg/dL — ABNORMAL LOW (ref 6–20)
CO2: 27 mmol/L (ref 22–32)
Calcium: 8.4 mg/dL — ABNORMAL LOW (ref 8.9–10.3)
Chloride: 103 mmol/L (ref 101–111)
Creatinine, Ser: 0.77 mg/dL (ref 0.61–1.24)
GFR calc Af Amer: >60 mL/min (ref >60)
GFR calc non Af Amer: >60 mL/min (ref >60)
Glucose, Bld: 98 mg/dL (ref 65–99)
Potassium: 3.6 mmol/L (ref 3.5–5.1)
Sodium: 136 mmol/L (ref 135–145)

## 2016-11-18 MED ORDER — SULFAMETHOXAZOLE-TRIMETHOPRIM 800-160 MG PO TABS: 1.0000 | ORAL_TABLET | Freq: Two times a day (BID) | ORAL | 0 refills | Status: AC

## 2016-11-18 NOTE — Discharge Instructions (Signed)

## 2016-11-18 NOTE — Progress Notes (Signed)
Pt ready for d/c home today per MD. He received dose of IV vanc this morning. Hydrotherapy was not done today because the hydrotherapy PTs are not here on Sundays, however a wet to dry dressing change was done per order. Pt's wife was educated on dressing changes (and she had seen previous hydrotherapy sessions), questions were answered, and she stated she felt comfortable doing dressing changes. Extra supplies were given. Discharge instructions and bactrim prescription were reviewed with pt and his wife, they denied questions.   [place], Jerry Caras

## 2016-11-18 NOTE — Progress Notes (Addendum)
  Subjective: Patient was seen and examined with wife present at bedside. No acute events overnight. He is POD 3 from surgical debridement of left hand abscess.  He said is pain is 6/10 in intensity. But reports decreased ereythema and swelling in this left arm.  He denies any chest pain, difficulty breathing, new-onset weakness or diarrhea.   Objective: Vital signs in last 24 hours: Vitals:   11/17/16 1005 11/17/16 1515 11/17/16 2042 11/18/16 0446  BP: 128/68 (!) 104/59 122/77 (!) 104/54  Pulse: 64 79 75 65  Resp:  17 18 17   Temp:  98.2 F (36.8 C) 98.2 F (36.8 C) (!) 97.4 F (36.3 C)  TempSrc:  Oral Oral Oral  SpO2:  98% 100% 96%  Weight:      Height:       Physical Exam General appearance: alert, cooperative and no distress Head: Normocephalic, without obvious abnormality, atraumatic Lungs: diminished breath sounds bilaterally Heart: regular rate and rhythm, S1, S2 normal, no murmur, click, rub or gallop Extremities: bandage present over left hand Skin: Skin color, texture, turgor normal. No rashes or lesions Neurologic: Grossly normal   Assessment/Plan:  Status post Surgical Debridement of Left Hand Abscess  -POD 3 -Culture and sensitivity results revealed MRSA with sensitivities resulted. Since patient is clinically stable, will transition to oral Bactrim bid. Will discharge to complete treatment for 10 days of ABX total.    Small Cell Lung Cancer  Last round of chemotherapy was 10/30/16. Monitoring for signs of decompensation.   Hypertension    Continue home medications   Disposition: Discharge today  This is a Careers information officer Note.  The care of the patient was discussed with Dr.Boswell and the assessment and plan formulated with their assistance.  Please see their attached note for official documentation of the daily encounter.   LOS: 3 days   Avva, Juanda Chance, Medical Student 11/18/2016, 11:37 AM   Attestation for Student Documentation: I have edited the note  above as appropriate.  I personally was present and performed or re-performed the history, physical exam and medical decision-making activities of this service and have verified that the service and findings are accurately documented in the student's note.  Maryellen Pile, MD 11/18/2016, 3:00 PM  Internal Medicine Attending  Date: 11/18/2016  Patient name: Philip [name] Medical record number: [id number] Date of birth: 08-24-1960 Age: 56 y.o. Gender: male  I saw and evaluated the patient. I reviewed the sub-intern/resident's note by Ms. Avva/Dr. Charlynn Grimes and I agree with the resident's findings and plans as documented in their progress note.  When seen on rounds this morning Philip [name] was without complaints and excited to be going home. The culture grew out methicillin resistant staph aureus and he will complete a 10 day course of oral Bactrim.

## 2016-11-18 NOTE — Discharge Summary (Signed)
Name: Philip [name] MRN: [id number] DOB: 12-10-1960 56 y.o. PCP: Philip Peach, MD  Date of Admission: 11/15/2016 12:36 PM Date of Discharge: 11/18/2016 Attending Physician: Oval Linsey, MD  Discharge Diagnosis: 1. Status post Surgical Debridement of Left Hand Abscess  2. Small Cell Lung Cancer  3.Hypertension   Discharge Medications: All home medications were continued. Prescribed Bactrim 160mg  bid for 6 days.    Disposition and follow-up:   Philip [name] was discharged from [facility] in good condition.  At the hospital follow up visit please address:  1.  Post-operative monitoring of left hand debridement due to recurrent abscess.   2.  Labs / imaging needed at time of follow-up: None  3.  Pending labs/ test needing follow-up: None  Follow-up Appointments: Follow-up Information    Philip Peach, MD Follow up.   Specialty:  Family Medicine Contact information: [place] [address] [phone]        Roseanne Kaufman, MD Follow up.   Specialty:  Orthopedic Surgery Contact information: [address] [place] [address] [phone]          Allergies as of 11/18/2016      Reactions   Amitriptyline Other (See Comments)   Burning sensation all over   Contrast Media [iodinated Diagnostic Agents] Other (See Comments)   Pt states that he got "knots behind his ears"   Nsaids Other (See Comments)   Blood in stools   Ibuprofen Other (See Comments), Nausea Only   Blood in stools   Tape Rash, Other (See Comments)   PLEASE USE COBAN WRAP; THE PATIENT'S SKIN TEARS WHEN BANDAGES ARE REMOVED!!      Medication List    TAKE these medications   atorvastatin 40 MG tablet Commonly known as:  LIPITOR TAKE 1 TABLET BY MOUTH DAILY What changed:  See the new instructions.   CALCIUM + D3 PO Take 1 tablet by mouth 3 (three) times daily.   chlorpheniramine-HYDROcodone 10-8 MG/5ML Suer Commonly  known as:  TUSSIONEX PENNKINETIC ER Take 5 mLs by mouth every 12 (twelve) hours as needed for cough.   clopidogrel 75 MG tablet Commonly known as:  PLAVIX Take 1 tablet (75 mg total) by mouth daily.   diphenhydrAMINE 25 mg capsule Commonly known as:  BENADRYL Take 25 mg by mouth 2 (two) times daily as needed for allergies.   lidocaine-prilocaine cream Commonly known as:  EMLA Apply to affected area once What changed:  how much to take  how to take this  when to take this  additional instructions   lisinopril 5 MG tablet Commonly known as:  PRINIVIL,ZESTRIL TAKE 1 TABLET BY MOUTH DAILY   LORazepam 0.5 MG tablet Commonly known as:  ATIVAN Take 1 tablet (0.5 mg total) by mouth every 6 (six) hours as needed (Nausea or vomiting).   metoprolol tartrate 25 MG tablet Commonly known as:  LOPRESSOR Take 1 tablet (25 mg total) by mouth 2 (two) times daily.   omeprazole 20 MG capsule Commonly known as:  PRILOSEC Take 20 mg by mouth daily.   ondansetron 8 MG tablet Commonly known as:  ZOFRAN Take 1 tablet (8 mg total) by mouth 2 (two) times daily as needed for refractory nausea / vomiting. Start on day 3 after carboplatin chemo.   oxyCODONE 10 mg 12 hr tablet Commonly known as:  OXYCONTIN Take 1 tablet (10 mg total) by mouth every 8 (eight) hours as needed. What changed:  reasons  to take this   oxyCODONE 5 MG immediate release tablet Commonly known as:  Oxy IR/ROXICODONE Take 1 tablet (5 mg total) by mouth every 4 (four) hours as needed for severe pain. What changed:  Another medication with the same name was changed. Make sure you understand how and when to take each.   promethazine 25 MG suppository Commonly known as:  PHENERGAN Place 1 suppository (25 mg total) rectally every 6 (six) hours as needed for nausea or vomiting.   sulfamethoxazole-trimethoprim 800-160 MG tablet Commonly known as:  BACTRIM DS,SEPTRA DS Take 1 tablet by mouth 2 (two) times daily.         Hospital Course by problem list: 1. Status post Surgical Debridement of Left Hand Abscess   -The patient presented to [facility] on 11/15/2016 for a recurrent left hand abscess which was initially, unsuccessfully, treated with two I/D procedures plus oral doxycyline and Augmentin. Upon admission, he was taken for a surgical debridement of his left hand abscess, wound cultures were taken, and the patient was placed on emperic IV Vancomycin. On POD 3 wound culture grew MRSA and the patient was transitioned to oral Bactrim, due to symptomatic and clinical improvement.   2. Small Cell Lung Cancer  -The patient's last round of chemotherapy was on 10/30/2016. Encouraged patietn to follow-up with oncologist upon discharge.    3.Hypertension  -Continue home medications   Discharge Vitals:   BP (!) 104/54 (BP Location: Right Arm)   Pulse 65   Temp (!) 97.4 F (36.3 C) (Oral)   Resp 17   Ht 5\' 11"  (1.803 m)   Wt 146 lb (66.2 kg)   SpO2 96%   BMI 20.36 kg/m   Pertinent Labs, Studies, and Procedures:  MRI of hand and wrist on 11/15/16 revealed superficial cellulitis and possible abscess of the ulnar aspect of the wrist. No evidence of osteomyelitis.    Discharge Instructions: Patient instructed to take entire course of antibiotics and follow up with Dr.Gramic's office, his oncologist and his PCP Discharge Instructions    Call MD for:  difficulty breathing, headache or visual disturbances    Complete by:  As directed    Call MD for:  redness, tenderness, or signs of infection (pain, swelling, redness, odor or green/yellow discharge around incision site)    Complete by:  As directed    Call MD for:  temperature >100.4    Complete by:  As directed    Diet - low sodium heart healthy    Complete by:  As directed    Discharge instructions    Complete by:  As directed    Please ensure that you complete your course of antibiotics. Please follow-up with Dr.Gramic and your PCP.   Increase activity  slowly    Complete by:  As directed       Signed: Maryellen Pile, MD 11/21/2016, 6:31 AM   Pager: [phone]

## 2016-11-20 ENCOUNTER — Telehealth: Payer: Self-pay | Admitting: *Deleted

## 2016-11-20 LAB — AEROBIC/ANAEROBIC CULTURE W GRAM STAIN (SURGICAL/DEEP WOUND)

## 2016-11-20 LAB — AEROBIC/ANAEROBIC CULTURE (SURGICAL/DEEP WOUND)

## 2016-11-20 NOTE — Telephone Encounter (Signed)
Patient called earlier to request refills for his 5 mg and 10 mg oxycodone. States he has enough to get him to Friday.  Per MD, patient is in remission with his cancer.  She will refill the pain medication only once more.  After this time patient will need to contact orthopedist if his pain is related to his infected hand.  Discussed with patient and he verbalized understanding.  I also informed him that MD has left the office for today and her team will be in [facility] tomorrow.  We will refill the prescriptions on Thursday and give him a call when they are ready.

## 2016-11-22 ENCOUNTER — Other Ambulatory Visit: Payer: Self-pay | Admitting: *Deleted

## 2016-11-22 ENCOUNTER — Telehealth: Payer: Self-pay | Admitting: *Deleted

## 2016-11-22 DIAGNOSIS — C7951 Secondary malignant neoplasm of bone: Secondary | ICD-10-CM

## 2016-11-22 DIAGNOSIS — C3490 Malignant neoplasm of unspecified part of unspecified bronchus or lung: Secondary | ICD-10-CM

## 2016-11-22 DIAGNOSIS — Z7189 Other specified counseling: Secondary | ICD-10-CM

## 2016-11-22 DIAGNOSIS — G893 Neoplasm related pain (acute) (chronic): Secondary | ICD-10-CM

## 2016-11-22 MED ORDER — OXYCODONE HCL ER 10 MG PO T12A: 10.0000 mg | EXTENDED_RELEASE_TABLET | Freq: Two times a day (BID) | ORAL | 0 refills | Status: DC

## 2016-11-22 MED ORDER — OXYCODONE HCL 5 MG PO TABS: 5.0000 mg | ORAL_TABLET | ORAL | 0 refills | Status: DC | PRN

## 2016-11-22 NOTE — Telephone Encounter (Signed)
Called patient to let him know his prescriptions are available.  Patient instructed to ask for them at the registration desk.

## 2016-12-05 ENCOUNTER — Inpatient Hospital Stay: Payer: Medicaid Other | Attending: Hematology and Oncology

## 2016-12-05 ENCOUNTER — Other Ambulatory Visit: Payer: Self-pay | Admitting: Cardiovascular Disease

## 2016-12-05 DIAGNOSIS — Z95828 Presence of other vascular implants and grafts: Secondary | ICD-10-CM

## 2016-12-05 DIAGNOSIS — C3411 Malignant neoplasm of upper lobe, right bronchus or lung: Secondary | ICD-10-CM | POA: Diagnosis not present

## 2016-12-05 DIAGNOSIS — Z452 Encounter for adjustment and management of vascular access device: Secondary | ICD-10-CM | POA: Diagnosis not present

## 2016-12-05 MED ORDER — SODIUM CHLORIDE 0.9% FLUSH
10.0000 mL | INTRAVENOUS | Status: DC | PRN
  Administered 2016-12-05: 10 mL via INTRAVENOUS
  Filled 2016-12-05: qty 10

## 2016-12-05 MED ORDER — HEPARIN SOD (PORK) LOCK FLUSH 100 UNIT/ML IV SOLN
500.0000 [IU] | Freq: Once | INTRAVENOUS | Status: AC
  Administered 2016-12-05: 500 [IU] via INTRAVENOUS

## 2016-12-05 MED ORDER — HEPARIN SOD (PORK) LOCK FLUSH 100 UNIT/ML IV SOLN
INTRAVENOUS | Status: AC
  Filled 2016-12-05: qty 5

## 2016-12-07 ENCOUNTER — Telehealth: Payer: Self-pay | Admitting: *Deleted

## 2016-12-07 NOTE — Telephone Encounter (Signed)
Left message with patient regarding appt scheduled with Dr. Baruch Gouty on Wed 8/29 @ 11am for PCI. Instructed pt to call back to confirm appt.

## 2016-12-10 NOTE — Telephone Encounter (Signed)
Pt has confirmed appt for wed 8/29 at 11am. No further questions or needs at this time.

## 2016-12-11 ENCOUNTER — Telehealth (INDEPENDENT_AMBULATORY_CARE_PROVIDER_SITE_OTHER): Payer: Self-pay | Admitting: Vascular Surgery

## 2016-12-11 NOTE — Telephone Encounter (Signed)
Nicoles CALLED, SAID WHERE DR DEW PUT HIS PORT IN FOR CHEMO HAS A STITCH HANGING OUT, HIS SHIRT KEEPS GETTING HUNG ON IT.  [facility] COULDN'T GET IT FOR HIM. HE WANTS TO COME IN TO HAVE IT CHECKED OUT AND POSSIBLY HAVE THE STITCH REMOVED

## 2016-12-12 ENCOUNTER — Other Ambulatory Visit: Payer: Self-pay | Admitting: *Deleted

## 2016-12-12 ENCOUNTER — Encounter (INDEPENDENT_AMBULATORY_CARE_PROVIDER_SITE_OTHER): Payer: Self-pay | Admitting: Vascular Surgery

## 2016-12-12 ENCOUNTER — Other Ambulatory Visit: Payer: Self-pay | Admitting: Oncology

## 2016-12-12 ENCOUNTER — Ambulatory Visit (INDEPENDENT_AMBULATORY_CARE_PROVIDER_SITE_OTHER): Payer: Medicaid Other | Admitting: Vascular Surgery

## 2016-12-12 ENCOUNTER — Ambulatory Visit
Admission: RE | Admit: 2016-12-12 | Discharge: 2016-12-12 | Disposition: A | Discharge status: Home / Self Care | Payer: Medicaid Other | Source: Ambulatory Visit | Attending: Radiation Oncology | Admitting: Radiation Oncology

## 2016-12-12 ENCOUNTER — Encounter: Payer: Self-pay | Admitting: Radiation Oncology

## 2016-12-12 VITALS — BP 159/100 | HR 76 | Temp 96.4°F | Resp 20 | Wt 144.7 lb

## 2016-12-12 VITALS — Wt 144.0 lb

## 2016-12-12 DIAGNOSIS — C7951 Secondary malignant neoplasm of bone: Secondary | ICD-10-CM

## 2016-12-12 DIAGNOSIS — F1120 Opioid dependence, uncomplicated: Secondary | ICD-10-CM | POA: Diagnosis not present

## 2016-12-12 DIAGNOSIS — I2510 Atherosclerotic heart disease of native coronary artery without angina pectoris: Secondary | ICD-10-CM | POA: Diagnosis not present

## 2016-12-12 DIAGNOSIS — C3490 Malignant neoplasm of unspecified part of unspecified bronchus or lung: Secondary | ICD-10-CM

## 2016-12-12 DIAGNOSIS — Z79899 Other long term (current) drug therapy: Secondary | ICD-10-CM | POA: Insufficient documentation

## 2016-12-12 DIAGNOSIS — F17210 Nicotine dependence, cigarettes, uncomplicated: Secondary | ICD-10-CM | POA: Diagnosis not present

## 2016-12-12 DIAGNOSIS — C3412 Malignant neoplasm of upper lobe, left bronchus or lung: Secondary | ICD-10-CM

## 2016-12-12 DIAGNOSIS — D649 Anemia, unspecified: Secondary | ICD-10-CM | POA: Insufficient documentation

## 2016-12-12 DIAGNOSIS — C3411 Malignant neoplasm of upper lobe, right bronchus or lung: Secondary | ICD-10-CM | POA: Diagnosis not present

## 2016-12-12 DIAGNOSIS — Z86718 Personal history of other venous thrombosis and embolism: Secondary | ICD-10-CM | POA: Diagnosis not present

## 2016-12-12 DIAGNOSIS — Z7189 Other specified counseling: Secondary | ICD-10-CM

## 2016-12-12 DIAGNOSIS — Z510 Encounter for antineoplastic radiation therapy: Secondary | ICD-10-CM | POA: Insufficient documentation

## 2016-12-12 DIAGNOSIS — I739 Peripheral vascular disease, unspecified: Secondary | ICD-10-CM | POA: Insufficient documentation

## 2016-12-12 DIAGNOSIS — Z801 Family history of malignant neoplasm of trachea, bronchus and lung: Secondary | ICD-10-CM | POA: Insufficient documentation

## 2016-12-12 DIAGNOSIS — G893 Neoplasm related pain (acute) (chronic): Secondary | ICD-10-CM

## 2016-12-12 DIAGNOSIS — I10 Essential (primary) hypertension: Secondary | ICD-10-CM | POA: Diagnosis not present

## 2016-12-12 DIAGNOSIS — E785 Hyperlipidemia, unspecified: Secondary | ICD-10-CM | POA: Insufficient documentation

## 2016-12-12 MED ORDER — OXYCODONE HCL ER 10 MG PO T12A
10.0000 mg | EXTENDED_RELEASE_TABLET | Freq: Two times a day (BID) | ORAL | 0 refills | Status: DC
Start: 2016-12-12 — End: 2016-12-12

## 2016-12-12 MED ORDER — OXYCODONE HCL 5 MG PO TABS: 5.0000 mg | ORAL_TABLET | ORAL | 0 refills | Status: DC | PRN

## 2016-12-12 MED ORDER — OXYCODONE HCL ER 10 MG PO T12A: 10.0000 mg | EXTENDED_RELEASE_TABLET | Freq: Two times a day (BID) | ORAL | 0 refills | Status: DC

## 2016-12-12 NOTE — Progress Notes (Signed)
Stitch removed by nursing staff

## 2016-12-12 NOTE — Consult Note (Signed)
NEW PATIENT EVALUATION  Name: Philip [name]  MRN: [id number]  Date:   12/12/2016     DOB: 28-Mar-1961   This 56 y.o. male patient presents to the clinic for initial evaluation of palliative options for patient with known extensive stage small cell lung cancer.  REFERRING PHYSICIAN: [name], MD  CHIEF COMPLAINT:  Chief Complaint  Patient presents with  . Lung Cancer    Pt is here for initial consultation for PCI.      DIAGNOSIS: The encounter diagnosis was Small cell lung cancer (Philip [name]).   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 He is recently had a PET CT scan showing marked response in the chest although there is hypermetabolic activity in the left axilla. Patient also has known metastatic disease by MRI scan of his T7 vertebral body. He is still in significant back pain which is narcotic dependent. I been asked to evaluate the patient for PCI after completion of his chemotherapy and he is also being discussed for clinical trial M-16. Patient is ambulating with assistance of a cane at this time. He specifically denies cough hemoptysis or chest tightness.  PLANNED TREATMENT REGIMEN: Palliative radiation therapy to the thoracic spine as well as PCI  PAST MEDICAL HISTORY:  has a past medical history of Anemia; CAD (coronary artery disease); DVT, recurrent, lower extremity, acute, left ([place]) (2016); Hyperlipidemia; Hypertension; Hypertensive heart disease; Lung cancer ([place]); PAD (peripheral artery disease) ([place]); Tobacco abuse; and Toe amputation status, left ([place]) (07/09/2016).    PAST SURGICAL HISTORY:  Past Surgical History:  Procedure Laterality Date  . CARDIAC CATHETERIZATION N/A 08/24/2014   Procedure: Left Heart Cath and  Coronary Angiography;  Surgeon: Isaias Cowman, MD;  Location: [place] [facility];  Service: Cardiovascular;  Laterality: N/A;  . CARDIAC CATHETERIZATION N/A 08/24/2014   Procedure: Coronary Stent Intervention;  Surgeon: Isaias Cowman, MD;  Location: [place] [facility];  Service: Cardiovascular;  Laterality: N/A;  . CORONARY ARTERY BYPASS GRAFT N/A 08/25/2014   Procedure: CORONARY ARTERY BYPASS GRAFTING (CABG), ON PUMP, TIMES THREE, USING BILATERAL MAMMARY ARTERIES, RIGHT GREATER SAPHENOUS VEIN HARVESTED ENDOSCOPICALLY;  Surgeon: Melrose Nakayama, MD;  Location: [place];  Service: Open Heart Surgery;  Laterality: N/A;  . INCISION AND DRAINAGE ABSCESS Left 11/15/2016   Procedure: INCISION AND DRAINAGE ABSCESS OF LEFT WRIST;  Surgeon: Roseanne Kaufman, MD;  Location: [place];  Service: Orthopedics;  Laterality: Left;  . PERIPHERAL VASCULAR CATHETERIZATION N/A 03/30/2015   Procedure: Abdominal Aortogram w/Lower Extremity;  Surgeon: Wellington Hampshire, MD;  Location: [place] [facility];  Service: Cardiovascular;  Laterality: N/A;  . PORTA CATH INSERTION N/A 07/27/2016   Procedure: Glori Luis Cath Insertion;  Surgeon: Algernon Huxley, MD;  Location: [place] [facility];  Service: Cardiovascular;  Laterality: N/A;  . TEE WITHOUT CARDIOVERSION N/A 08/25/2014   Procedure: TRANSESOPHAGEAL ECHOCARDIOGRAM (TEE);  Surgeon: Melrose Nakayama, MD;  Location: [place];  Service: Open Heart Surgery;  Laterality: N/A;    FAMILY HISTORY: family history includes Breast cancer in his sister; Colon cancer in his paternal grandfather; Heart attack in his brother, brother, and sister; Heart attack (age of onset: 35) in his mother; Hyperlipidemia in his mother; Hypertension in his father; Lung cancer in his father.  SOCIAL HISTORY:  reports that he has been  smoking Cigarettes.  He has been smoking about 0.50 packs per day. He has never used smokeless tobacco. He reports that he drinks alcohol. He reports that he does not use  drugs.  ALLERGIES: Amitriptyline; Contrast media [iodinated diagnostic agents]; Nsaids; Ibuprofen; and Tape  MEDICATIONS:  Current Outpatient Prescriptions  Medication Sig Dispense Refill  . atorvastatin (LIPITOR) 40 MG tablet TAKE 1 TABLET BY MOUTH DAILY (Patient taking differently: Take 40 mg by mouth once a day) 90 tablet 3  . Calcium Carb-Cholecalciferol (CALCIUM + D3 PO) Take 1 tablet by mouth 3 (three) times daily.    . clopidogrel (PLAVIX) 75 MG tablet Take 1 tablet (75 mg total) by mouth daily. 90 tablet 3  . diphenhydrAMINE (BENADRYL) 25 mg capsule Take 25 mg by mouth 2 (two) times daily as needed for allergies.     Marland Kitchen lidocaine-prilocaine (EMLA) cream Apply to affected area once (Patient taking differently: Apply 1 application topically See admin instructions. TO AFFECTED AREA AS DIRECTED) 30 g 3  . lisinopril (PRINIVIL,ZESTRIL) 5 MG tablet TAKE 1 TABLET BY MOUTH DAILY 90 tablet 1  . LORazepam (ATIVAN) 0.5 MG tablet Take 1 tablet (0.5 mg total) by mouth every 6 (six) hours as needed (Nausea or vomiting). 30 tablet 0  . metoprolol tartrate (LOPRESSOR) 25 MG tablet TAKE 1 TABLET BY MOUTH TWICE DAILY 180 tablet 3  . omeprazole (PRILOSEC) 20 MG capsule Take 20 mg by mouth daily.    . promethazine (PHENERGAN) 25 MG suppository Place 1 suppository (25 mg total) rectally every 6 (six) hours as needed for nausea or vomiting. 12 each 5  . chlorpheniramine-HYDROcodone (TUSSIONEX PENNKINETIC ER) 10-8 MG/5ML SUER Take 5 mLs by mouth every 12 (twelve) hours as needed for cough. (Patient not taking: Reported on 11/15/2016) 140 mL 0  . ondansetron (ZOFRAN) 8 MG tablet Take 1 tablet (8 mg total) by mouth 2 (two) times daily as needed for refractory nausea / vomiting. Start on day 3 after carboplatin chemo. (Patient not taking: Reported on 11/15/2016) 30 tablet 1  . oxyCODONE (OXY IR/ROXICODONE) 5 MG immediate release tablet Take 1 tablet (5 mg total) by mouth every 4 (four) hours as needed for severe pain.  40 tablet 0  . oxyCODONE (OXYCONTIN) 10 mg 12 hr tablet Take 1 tablet (10 mg total) by mouth every 12 (twelve) hours. 40 tablet 0   No current facility-administered medications for this encounter.     ECOG PERFORMANCE STATUS:  1 - Symptomatic but completely ambulatory  REVIEW OF SYSTEMS:  Patient denies any weight loss, fatigue, weakness, fever, chills or night sweats. Patient denies any loss of vision, blurred vision. Patient denies any ringing  of the ears or hearing loss. No irregular heartbeat. Patient denies heart murmur or history of fainting. Patient denies any chest pain or pain radiating to her upper extremities. Patient denies any shortness of breath, difficulty breathing at night, cough or hemoptysis. Patient denies any swelling in the lower legs. Patient denies any nausea vomiting, vomiting of blood, or coffee ground material in the vomitus. Patient denies any stomach pain. Patient states has had normal bowel movements no significant constipation or diarrhea. Patient denies any dysuria, hematuria or significant nocturia. Patient denies any problems walking, swelling in the joints or loss of balance. Patient denies any skin changes, loss of hair or loss of weight. Patient denies any excessive worrying or anxiety or significant depression. Patient denies any problems with insomnia. Patient denies excessive thirst, polyuria, polydipsia. Patient denies any swollen glands, patient denies  easy bruising or easy bleeding. Patient denies any recent infections, allergies or URI. Patient "s visual fields have not changed significantly in recent time.    PHYSICAL EXAM: BP (!) 159/100   Pulse 76   Temp (!) 96.4 F (35.8 C)   Resp 20   Wt 144 lb 11.7 oz (65.6 kg)   BMI 20.19 kg/m  Palpation of his mid back does elicit significant pain. Motor and sensory levels are equal and symmetric in the lower extremities. Well-developed well-nourished patient in NAD. HEENT reveals PERLA, EOMI, discs not  visualized.  Oral cavity is clear. No oral mucosal lesions are identified. Neck is clear without evidence of cervical or supraclavicular adenopathy. Lungs are clear to A&P. Cardiac examination is essentially unremarkable with regular rate and rhythm without murmur rub or thrill. Abdomen is benign with no organomegaly or masses noted. Motor sensory and DTR levels are equal and symmetric in the upper and lower extremities. Cranial nerves II through XII are grossly intact. Proprioception is intact. No peripheral adenopathy or edema is identified. No motor or sensory levels are noted. Crude visual fields are within normal range.  LABORATORY DATA: Pathology reports reviewed    RADIOLOGY RESULTS: MRI scans and CT scans as well as PET/CT scan all reviewed and compatible above-stated findings   IMPRESSION: 56 year old male with extensive stage small cell lung cancer with metastatic disease to his thoracic spine causing narcotic dependent pain.  PLAN: At this time I to go ahead with the palliative course of radiation therapy to his thoracic spine. I would plan on delivering 3000 cGy in 10 fractions. Risks and benefits of that treatment including possible diarrhea skin reaction fatigue alteration of blood counts all were discussed in detail. I also after completion of that course of treatment would offer PCI and treat to 3000 cGy in 15 fractions. Risks and benefits of treatment for brain including possible cognitive decline loss of hair alteration of blood counts skin reaction all were discussed in detail. I first set up and ordered CT simulation of his spine for early next week. Patient and his wife both comprehend my treatment plan well.  I would like to take this opportunity to thank you for allowing me to participate in the care of your patient.Armstead Peaks., MD

## 2016-12-13 ENCOUNTER — Encounter (INDEPENDENT_AMBULATORY_CARE_PROVIDER_SITE_OTHER): Payer: Medicaid Other

## 2016-12-19 ENCOUNTER — Ambulatory Visit
Admission: RE | Admit: 2016-12-19 | Discharge: 2016-12-19 | Disposition: A | Discharge status: Home / Self Care | Payer: Medicaid Other | Source: Ambulatory Visit | Attending: Radiation Oncology | Admitting: Radiation Oncology

## 2016-12-19 DIAGNOSIS — Z510 Encounter for antineoplastic radiation therapy: Secondary | ICD-10-CM | POA: Diagnosis not present

## 2016-12-24 ENCOUNTER — Other Ambulatory Visit: Payer: Self-pay | Admitting: *Deleted

## 2016-12-24 DIAGNOSIS — C7951 Secondary malignant neoplasm of bone: Secondary | ICD-10-CM

## 2016-12-24 DIAGNOSIS — Z510 Encounter for antineoplastic radiation therapy: Secondary | ICD-10-CM | POA: Diagnosis not present

## 2016-12-26 ENCOUNTER — Other Ambulatory Visit: Payer: Self-pay | Admitting: *Deleted

## 2016-12-26 ENCOUNTER — Ambulatory Visit
Admission: RE | Admit: 2016-12-26 | Discharge: 2016-12-26 | Disposition: A | Discharge status: Home / Self Care | Payer: Medicaid Other | Source: Ambulatory Visit | Attending: Radiation Oncology | Admitting: Radiation Oncology

## 2016-12-26 DIAGNOSIS — Z7189 Other specified counseling: Secondary | ICD-10-CM

## 2016-12-26 DIAGNOSIS — G893 Neoplasm related pain (acute) (chronic): Secondary | ICD-10-CM

## 2016-12-26 DIAGNOSIS — C7951 Secondary malignant neoplasm of bone: Secondary | ICD-10-CM

## 2016-12-26 DIAGNOSIS — C3490 Malignant neoplasm of unspecified part of unspecified bronchus or lung: Secondary | ICD-10-CM

## 2016-12-26 DIAGNOSIS — Z510 Encounter for antineoplastic radiation therapy: Secondary | ICD-10-CM | POA: Diagnosis not present

## 2016-12-26 NOTE — Telephone Encounter (Signed)
Received a message from answer service requesting a refill of medicine, but name not left of what he needs. Returned call and got voice mail left message to return my call with name of what he needs refilled

## 2016-12-27 ENCOUNTER — Ambulatory Visit
Admission: RE | Admit: 2016-12-27 | Discharge: 2016-12-27 | Disposition: A | Discharge status: Home / Self Care | Payer: Medicaid Other | Source: Ambulatory Visit | Attending: Radiation Oncology | Admitting: Radiation Oncology

## 2016-12-27 ENCOUNTER — Telehealth: Payer: Self-pay | Admitting: Cardiovascular Disease

## 2016-12-27 ENCOUNTER — Other Ambulatory Visit: Payer: Self-pay

## 2016-12-27 DIAGNOSIS — Z510 Encounter for antineoplastic radiation therapy: Secondary | ICD-10-CM | POA: Diagnosis not present

## 2016-12-27 MED ORDER — OXYCODONE HCL ER 10 MG PO T12A: 10.0000 mg | EXTENDED_RELEASE_TABLET | Freq: Two times a day (BID) | ORAL | 0 refills | Status: DC

## 2016-12-27 MED ORDER — OXYCODONE HCL 5 MG PO TABS
5.0000 mg | ORAL_TABLET | ORAL | 0 refills | Status: DC | PRN
Start: 2016-12-27 — End: 2017-01-03

## 2016-12-27 MED ORDER — LISINOPRIL 5 MG PO TABS: 5.0000 mg | ORAL_TABLET | Freq: Every day | ORAL | 1 refills | Status: DC

## 2016-12-27 NOTE — Telephone Encounter (Signed)
Requested Prescriptions   Signed Prescriptions Disp Refills  . lisinopril (PRINIVIL,ZESTRIL) 5 MG tablet 90 tablet 1    Sig: Take 1 tablet (5 mg total) by mouth daily.    Authorizing Provider: Rogelia Mire    Ordering User: Janan Ridge

## 2016-12-27 NOTE — Telephone Encounter (Signed)
°*  STAT* If patient is at the pharmacy, call can be transferred to refill team.   1. Which medications need to be refilled? (please list name of each medication and dose if known)  Lisinopril   2. Which pharmacy/location (including street and city if local pharmacy) is medication to be sent to? [facility]   3. Do they need a 30 day or 90 day supply? 90 day

## 2016-12-28 ENCOUNTER — Ambulatory Visit: Payer: Medicaid Other

## 2016-12-31 ENCOUNTER — Ambulatory Visit: Admission: RE | Admit: 2016-12-31 | Payer: Medicaid Other | Source: Ambulatory Visit

## 2017-01-01 ENCOUNTER — Ambulatory Visit
Admission: RE | Admit: 2017-01-01 | Discharge: 2017-01-01 | Disposition: A | Discharge status: Home / Self Care | Payer: Medicaid Other | Source: Ambulatory Visit | Attending: Radiation Oncology | Admitting: Radiation Oncology

## 2017-01-01 DIAGNOSIS — Z510 Encounter for antineoplastic radiation therapy: Secondary | ICD-10-CM | POA: Diagnosis not present

## 2017-01-02 ENCOUNTER — Ambulatory Visit
Admission: RE | Admit: 2017-01-02 | Discharge: 2017-01-02 | Disposition: A | Discharge status: Home / Self Care | Payer: Medicaid Other | Source: Ambulatory Visit | Attending: Radiation Oncology | Admitting: Radiation Oncology

## 2017-01-02 ENCOUNTER — Inpatient Hospital Stay: Payer: Medicaid Other | Attending: Radiation Oncology

## 2017-01-02 DIAGNOSIS — C7951 Secondary malignant neoplasm of bone: Secondary | ICD-10-CM | POA: Insufficient documentation

## 2017-01-02 DIAGNOSIS — Z510 Encounter for antineoplastic radiation therapy: Secondary | ICD-10-CM | POA: Diagnosis not present

## 2017-01-02 LAB — CBC
HEMATOCRIT: 34.8 % — AB (ref 40.0–52.0)
Hemoglobin: 12.5 g/dL — ABNORMAL LOW (ref 13.0–18.0)
MCH: 36.1 pg — AB (ref 26.0–34.0)
MCHC: 35.9 g/dL (ref 32.0–36.0)
MCV: 100.6 fL — AB (ref 80.0–100.0)
Platelets: 165 10*3/uL (ref 150–440)
RBC: 3.46 MIL/uL — ABNORMAL LOW (ref 4.40–5.90)
RDW: 15.2 % — ABNORMAL HIGH (ref 11.5–14.5)
WBC: 3.5 10*3/uL — ABNORMAL LOW (ref 3.8–10.6)

## 2017-01-03 ENCOUNTER — Other Ambulatory Visit: Payer: Self-pay | Admitting: *Deleted

## 2017-01-03 ENCOUNTER — Ambulatory Visit
Admission: RE | Admit: 2017-01-03 | Discharge: 2017-01-03 | Disposition: A | Discharge status: Home / Self Care | Payer: Medicaid Other | Source: Ambulatory Visit | Attending: Radiation Oncology | Admitting: Radiation Oncology

## 2017-01-03 DIAGNOSIS — Z7189 Other specified counseling: Secondary | ICD-10-CM

## 2017-01-03 DIAGNOSIS — Z510 Encounter for antineoplastic radiation therapy: Secondary | ICD-10-CM | POA: Diagnosis not present

## 2017-01-03 DIAGNOSIS — C3490 Malignant neoplasm of unspecified part of unspecified bronchus or lung: Secondary | ICD-10-CM

## 2017-01-03 DIAGNOSIS — G893 Neoplasm related pain (acute) (chronic): Secondary | ICD-10-CM

## 2017-01-03 DIAGNOSIS — C7951 Secondary malignant neoplasm of bone: Secondary | ICD-10-CM

## 2017-01-03 MED ORDER — OXYCODONE HCL 5 MG PO TABS: 5.0000 mg | ORAL_TABLET | ORAL | 0 refills | Status: DC | PRN

## 2017-01-04 ENCOUNTER — Ambulatory Visit
Admission: RE | Admit: 2017-01-04 | Discharge: 2017-01-04 | Disposition: A | Discharge status: Home / Self Care | Payer: Medicaid Other | Source: Ambulatory Visit | Attending: Radiation Oncology | Admitting: Radiation Oncology

## 2017-01-04 DIAGNOSIS — Z510 Encounter for antineoplastic radiation therapy: Secondary | ICD-10-CM | POA: Diagnosis not present

## 2017-01-07 ENCOUNTER — Ambulatory Visit
Admission: RE | Admit: 2017-01-07 | Discharge: 2017-01-07 | Disposition: A | Discharge status: Home / Self Care | Payer: Medicaid Other | Source: Ambulatory Visit | Attending: Radiation Oncology | Admitting: Radiation Oncology

## 2017-01-07 DIAGNOSIS — Z510 Encounter for antineoplastic radiation therapy: Secondary | ICD-10-CM | POA: Diagnosis not present

## 2017-01-08 ENCOUNTER — Ambulatory Visit
Admission: RE | Admit: 2017-01-08 | Discharge: 2017-01-08 | Disposition: A | Discharge status: Home / Self Care | Payer: Medicaid Other | Source: Ambulatory Visit | Attending: Radiation Oncology | Admitting: Radiation Oncology

## 2017-01-08 DIAGNOSIS — Z510 Encounter for antineoplastic radiation therapy: Secondary | ICD-10-CM | POA: Diagnosis not present

## 2017-01-09 ENCOUNTER — Ambulatory Visit: Payer: Medicaid Other

## 2017-01-09 ENCOUNTER — Ambulatory Visit
Admission: RE | Admit: 2017-01-09 | Discharge: 2017-01-09 | Disposition: A | Discharge status: Home / Self Care | Payer: Medicaid Other | Source: Ambulatory Visit | Attending: Radiation Oncology | Admitting: Radiation Oncology

## 2017-01-09 DIAGNOSIS — Z510 Encounter for antineoplastic radiation therapy: Secondary | ICD-10-CM | POA: Diagnosis not present

## 2017-01-10 ENCOUNTER — Ambulatory Visit: Payer: Medicaid Other

## 2017-01-10 ENCOUNTER — Ambulatory Visit
Admission: RE | Admit: 2017-01-10 | Discharge: 2017-01-10 | Disposition: A | Discharge status: Home / Self Care | Payer: Medicaid Other | Source: Ambulatory Visit | Attending: Radiation Oncology | Admitting: Radiation Oncology

## 2017-01-10 ENCOUNTER — Telehealth: Payer: Self-pay | Admitting: *Deleted

## 2017-01-10 ENCOUNTER — Other Ambulatory Visit: Payer: Self-pay | Admitting: *Deleted

## 2017-01-10 DIAGNOSIS — Z510 Encounter for antineoplastic radiation therapy: Secondary | ICD-10-CM | POA: Diagnosis not present

## 2017-01-10 MED ORDER — SUCRALFATE 1 G PO TABS
1.0000 g | ORAL_TABLET | Freq: Three times a day (TID) | ORAL | 3 refills | Status: DC
Start: 2017-01-10 — End: 2017-05-28

## 2017-01-10 NOTE — Telephone Encounter (Signed)
Please schedule patient for a follow up appointment. He has not been seen by Dr. Mike Gip in awhile. We will discuss refills at RTC visit.

## 2017-01-10 NOTE — Telephone Encounter (Signed)
Patient called requesting medication oxycodone 5 mg. Please advise.

## 2017-01-10 NOTE — Telephone Encounter (Signed)
  Patient needs to be seen in clinic for follow-up.  We have not seen him in awhile.  M

## 2017-01-11 ENCOUNTER — Ambulatory Visit
Admission: RE | Admit: 2017-01-11 | Discharge: 2017-01-11 | Disposition: A | Discharge status: Home / Self Care | Payer: Medicaid Other | Source: Ambulatory Visit | Attending: Radiation Oncology | Admitting: Radiation Oncology

## 2017-01-11 DIAGNOSIS — Z510 Encounter for antineoplastic radiation therapy: Secondary | ICD-10-CM | POA: Diagnosis not present

## 2017-01-14 ENCOUNTER — Ambulatory Visit (INDEPENDENT_AMBULATORY_CARE_PROVIDER_SITE_OTHER): Payer: Medicaid Other | Admitting: Cardiovascular Disease

## 2017-01-14 ENCOUNTER — Encounter: Payer: Self-pay | Admitting: Cardiovascular Disease

## 2017-01-14 VITALS — BP 130/100 | HR 91 | Ht 71.0 in | Wt 139.5 lb

## 2017-01-14 DIAGNOSIS — I10 Essential (primary) hypertension: Secondary | ICD-10-CM

## 2017-01-14 DIAGNOSIS — E785 Hyperlipidemia, unspecified: Secondary | ICD-10-CM | POA: Diagnosis not present

## 2017-01-14 DIAGNOSIS — I739 Peripheral vascular disease, unspecified: Secondary | ICD-10-CM | POA: Diagnosis not present

## 2017-01-14 DIAGNOSIS — Z23 Encounter for immunization: Secondary | ICD-10-CM | POA: Diagnosis not present

## 2017-01-14 DIAGNOSIS — I2510 Atherosclerotic heart disease of native coronary artery without angina pectoris: Secondary | ICD-10-CM | POA: Diagnosis not present

## 2017-01-14 NOTE — Patient Instructions (Signed)
Medication Instructions:  Your physician recommends that you continue on your current medications as directed. Please refer to the Current Medication list given to you today.   Labwork: none  Testing/Procedures: Your physician has requested that you have a lower extremity arterialduplex. During this test, an ultrasound is used to evaluate arterial blood flow in the legs. Allow one hour for this exam. There are no restrictions or special instructions.   Follow-Up: Your physician wants you to follow-up in: 6 months with Dr. Fletcher Anon.  You will receive a reminder letter in the mail two months in advance. If you don't receive a letter, please call our office to schedule the follow-up appointment.   Any Other Special Instructions Will Be Listed Below (If Applicable).     If you need a refill on your cardiac medications before your next appointment, please call your pharmacy.

## 2017-01-14 NOTE — Progress Notes (Signed)
Cardiology Office Note   Date:  01/14/2017   ID:  Philip [name], DOB 06/29/1960, MRN [id number]  PCP:  Dr. Iona Beard  Cardiologist:   Kathlyn Sacramento, MD   Chief Complaint  Patient presents with  . other    3 month follow up. Pt. c/o chest pain at times that radiates to his back as well as shortness of breath. Pt. had chemotherapy for several months and now is going through radiation. Meds reviewed by the pt. verbally.       History of Present Illness: Philip [name] is a 56 y.o. male who presents for a follow-up visit regarding coronary artery disease and peripheral arterial disease.  The patient has known history of coronary artery disease. He presented with inferior STEMI 08/24/2014. Cardiac catheterization revealed a complex 90% stenosis proximal LAD, 95% stenosis mid LAD, 70% stenosis D1, and 75% stenosis mid RCA. The patient was transferred [facility] where he underwent CABG x3, with LIMA to LAD, SVG 2D 1, and RIMA to RCA. He has other medical problems that include tobacco use, hypertension and hyperlipidemia. He has known history of peripheral arterial disease with previous left popliteal artery self-expanding stent placement due to rest pain and embolization into the dorsalis pedis. He had GI bleed on dual antiplatelet therapy and currently on clopidogrel without aspirin. Unfortunately, he was diagnosed with metastatic lung cancer this year and he has been undergoing chemotherapy and radiation. He has recurrent pleuritic chest pain and back pain. He continues to complain of numbness in the left foot but no significant claudication. There continues to be dark discoloration at the base of the toes.  Past Medical History:  Diagnosis Date  . Anemia   . CAD (coronary artery disease)    a. 08/2014 Inf STEMI/CABG x 3 (LIMA->LAD, VG->Diag, RIMA->RCA).  . DVT, recurrent, lower extremity, acute, left ([place]) 2016  . Hyperlipidemia   . Hypertension   . Hypertensive heart  disease   . Lung cancer ([place])   . PAD (peripheral artery disease) ([place])    a. 03/2015 Periph Angio: short occlusion of L pop w/ evidence of embolization into the DP->5.0x50 mm Inova self-expanding stent;  b. 04/08/2015 ABI: R 1.12, L 0.99.  . Tobacco abuse   . Toe amputation status, left [facility]) 07/09/2016   2017    Past Surgical History:  Procedure Laterality Date  . CARDIAC CATHETERIZATION N/A 08/24/2014   Procedure: Left Heart Cath and Coronary Angiography;  Surgeon: Isaias Cowman, MD;  Location: [place] [facility];  Service: Cardiovascular;  Laterality: N/A;  . CARDIAC CATHETERIZATION N/A 08/24/2014   Procedure: Coronary Stent Intervention;  Surgeon: Isaias Cowman, MD;  Location: [place] [facility];  Service: Cardiovascular;  Laterality: N/A;  . CORONARY ARTERY BYPASS GRAFT N/A 08/25/2014   Procedure: CORONARY ARTERY BYPASS GRAFTING (CABG), ON PUMP, TIMES THREE, USING BILATERAL MAMMARY ARTERIES, RIGHT GREATER SAPHENOUS VEIN HARVESTED ENDOSCOPICALLY;  Surgeon: Melrose Nakayama, MD;  Location: [place];  Service: Open Heart Surgery;  Laterality: N/A;  . INCISION AND DRAINAGE ABSCESS Left 11/15/2016   Procedure: INCISION AND DRAINAGE ABSCESS OF LEFT WRIST;  Surgeon: Roseanne Kaufman, MD;  Location: [place];  Service: Orthopedics;  Laterality: Left;  . PERIPHERAL VASCULAR CATHETERIZATION N/A 03/30/2015   Procedure: Abdominal Aortogram w/Lower Extremity;  Surgeon: Wellington Hampshire, MD;  Location: [place] [facility];  Service: Cardiovascular;  Laterality: N/A;  . PORTA CATH INSERTION N/A 07/27/2016   Procedure: Glori Luis Cath Insertion;  Surgeon: Algernon Huxley,  MD;  Location: [place] [facility];  Service: Cardiovascular;  Laterality: N/A;  . TEE WITHOUT CARDIOVERSION N/A 08/25/2014   Procedure: TRANSESOPHAGEAL ECHOCARDIOGRAM (TEE);  Surgeon: Melrose Nakayama, MD;  Location: [place];  Service: Open Heart Surgery;  Laterality: N/A;     Current Outpatient Prescriptions  Medication Sig  Dispense Refill  . atorvastatin (LIPITOR) 40 MG tablet TAKE 1 TABLET BY MOUTH DAILY (Patient taking differently: Take 40 mg by mouth once a day) 90 tablet 3  . Calcium Carb-Cholecalciferol (CALCIUM + D3 PO) Take 1 tablet by mouth 3 (three) times daily.    . clopidogrel (PLAVIX) 75 MG tablet Take 1 tablet (75 mg total) by mouth daily. 90 tablet 3  . diphenhydrAMINE (BENADRYL) 25 mg capsule Take 25 mg by mouth 2 (two) times daily as needed for allergies.     Marland Kitchen lidocaine-prilocaine (EMLA) cream Apply to affected area once (Patient taking differently: Apply 1 application topically See admin instructions. TO AFFECTED AREA AS DIRECTED) 30 g 3  . lisinopril (PRINIVIL,ZESTRIL) 5 MG tablet Take 1 tablet (5 mg total) by mouth daily. 90 tablet 1  . metoprolol tartrate (LOPRESSOR) 25 MG tablet TAKE 1 TABLET BY MOUTH TWICE DAILY 180 tablet 3  . omeprazole (PRILOSEC) 20 MG capsule Take 20 mg by mouth daily.    . ondansetron (ZOFRAN) 8 MG tablet Take 1 tablet (8 mg total) by mouth 2 (two) times daily as needed for refractory nausea / vomiting. Start on day 3 after carboplatin chemo. 30 tablet 1  . oxyCODONE (OXY IR/ROXICODONE) 5 MG immediate release tablet Take 1 tablet (5 mg total) by mouth every 4 (four) hours as needed for severe pain. 40 tablet 0  . oxyCODONE (OXYCONTIN) 10 mg 12 hr tablet Take 1 tablet (10 mg total) by mouth every 12 (twelve) hours. 40 tablet 0  . promethazine (PHENERGAN) 25 MG suppository Place 1 suppository (25 mg total) rectally every 6 (six) hours as needed for nausea or vomiting. 12 each 5  . sucralfate (CARAFATE) 1 g tablet Take 1 tablet (1 g total) by mouth 3 (three) times daily. Dissolve in 2-3 tbsp warm water, swish and swallow 90 tablet 3   No current facility-administered medications for this visit.     Allergies:   Amitriptyline; Contrast media [iodinated diagnostic agents]; Nsaids; Ibuprofen; and Tape    Social History:  The patient  reports that he has been smoking  Cigarettes.  He has been smoking about 0.50 packs per day. He has never used smokeless tobacco. He reports that he drinks alcohol. He reports that he does not use drugs.   Family History:  The patient's family history includes Breast cancer in his sister; Colon cancer in his paternal grandfather; Heart attack in his brother, brother, and sister; Heart attack (age of onset: 11) in his mother; Hyperlipidemia in his mother; Hypertension in his father; Lung cancer in his father.    ROS:  Please see the history of present illness.   Otherwise, review of systems are positive for none.   All other systems are reviewed and negative.    PHYSICAL EXAM: VS:  BP (!) 130/100 (BP Location: Left Arm, Patient Position: Sitting, Cuff Size: Normal)   Pulse 91   Ht 5\' 11"  (1.803 m)   Wt 139 lb 8 oz (63.3 kg)   BMI 19.46 kg/m  , BMI Body mass index is 19.46 kg/m. GEN: Well nourished, well developed, in no acute distress  HEENT: normal  Neck: no JVD, carotid bruits,  or masses Cardiac: RRR; no murmurs, rubs, or gallops,no edema  Respiratory:  clear to auscultation bilaterally, normal work of breathing GI: soft, nontender, nondistended, + BS MS: no deformity or atrophy  Skin: warm and dry, no rash Neuro:  Strength and sensation are intact Psych: euthymic mood, full affect Vascular: Posterior tibial is +2 bilaterally. Dorsalis pedis is +2 on the right and 0 on the left  EKG:  EKG is ordered today. The ekg ordered today demonstrates normal sinus rhythm with no significant ST or T wave changes.   Recent Labs: 10/23/2016: TSH 0.946 10/30/2016: ALT 13; Magnesium 0.9 11/18/2016: BUN <5; Creatinine, Ser 0.77; Potassium 3.6; Sodium 136 01/02/2017: Hemoglobin 12.5; Platelets 165    Lipid Panel    Component Value Date/Time   CHOL 182 07/27/2016 0602   TRIG 115 07/27/2016 0602   HDL 47 07/27/2016 0602   CHOLHDL 3.9 07/27/2016 0602   VLDL 23 07/27/2016 0602   LDLCALC 112 (H) 07/27/2016 0602      Wt  Readings from Last 3 Encounters:  01/14/17 139 lb 8 oz (63.3 kg)  12/12/16 144 lb 11.7 oz (65.6 kg)  12/12/16 144 lb (65.3 kg)       No flowsheet data found.    ASSESSMENT AND PLAN:  1.  Peripheral arterial disease: Status post left popliteal artery self-expanding stent placement in December 2016.  He continues to have left foot numbness. I requested a follow-up lower extremity arterial Doppler and duplex of the left lower extremity.  2. Coronary artery disease involving native coronary arteries without angina: Continue medical therapy. Chest pain is pleuritic in likely due to lung cancer.  3. Hyperlipidemia: Continue treatment with atorvastatin.  4. Severe pain in his back likely related to metastatic lung cancer. Consider referral to the pain clinic for management.  Disposition:   FU with me in 6 months  Signed,  Kathlyn Sacramento, MD  01/14/2017 1:24 PM    [facility]

## 2017-01-15 ENCOUNTER — Other Ambulatory Visit: Payer: Self-pay | Admitting: Cardiovascular Disease

## 2017-01-15 DIAGNOSIS — I779 Disorder of arteries and arterioles, unspecified: Secondary | ICD-10-CM

## 2017-01-16 ENCOUNTER — Inpatient Hospital Stay: Payer: Medicaid Other

## 2017-01-17 ENCOUNTER — Other Ambulatory Visit: Payer: Self-pay | Admitting: *Deleted

## 2017-01-17 ENCOUNTER — Inpatient Hospital Stay: Payer: Medicaid Other

## 2017-01-17 ENCOUNTER — Inpatient Hospital Stay: Payer: Medicaid Other | Attending: Hematology and Oncology

## 2017-01-17 ENCOUNTER — Ambulatory Visit
Admission: RE | Admit: 2017-01-17 | Discharge: 2017-01-17 | Disposition: A | Discharge status: Home / Self Care | Payer: Medicaid Other | Source: Ambulatory Visit | Attending: Radiation Oncology | Admitting: Radiation Oncology

## 2017-01-17 ENCOUNTER — Inpatient Hospital Stay (HOSPITAL_BASED_OUTPATIENT_CLINIC_OR_DEPARTMENT_OTHER): Payer: Medicaid Other | Admitting: Hematology and Oncology

## 2017-01-17 VITALS — BP 139/94 | HR 84 | Temp 95.9°F | Resp 18 | Wt 140.4 lb

## 2017-01-17 DIAGNOSIS — R112 Nausea with vomiting, unspecified: Secondary | ICD-10-CM | POA: Insufficient documentation

## 2017-01-17 DIAGNOSIS — Z8614 Personal history of Methicillin resistant Staphylococcus aureus infection: Secondary | ICD-10-CM | POA: Insufficient documentation

## 2017-01-17 DIAGNOSIS — C3412 Malignant neoplasm of upper lobe, left bronchus or lung: Secondary | ICD-10-CM | POA: Insufficient documentation

## 2017-01-17 DIAGNOSIS — G893 Neoplasm related pain (acute) (chronic): Secondary | ICD-10-CM

## 2017-01-17 DIAGNOSIS — C7951 Secondary malignant neoplasm of bone: Secondary | ICD-10-CM | POA: Diagnosis not present

## 2017-01-17 DIAGNOSIS — D649 Anemia, unspecified: Secondary | ICD-10-CM | POA: Diagnosis not present

## 2017-01-17 DIAGNOSIS — K219 Gastro-esophageal reflux disease without esophagitis: Secondary | ICD-10-CM

## 2017-01-17 DIAGNOSIS — Z8701 Personal history of pneumonia (recurrent): Secondary | ICD-10-CM

## 2017-01-17 DIAGNOSIS — I739 Peripheral vascular disease, unspecified: Secondary | ICD-10-CM | POA: Insufficient documentation

## 2017-01-17 DIAGNOSIS — I252 Old myocardial infarction: Secondary | ICD-10-CM

## 2017-01-17 DIAGNOSIS — Y842 Radiological procedure and radiotherapy as the cause of abnormal reaction of the patient, or of later complication, without mention of misadventure at the time of the procedure: Secondary | ICD-10-CM | POA: Diagnosis not present

## 2017-01-17 DIAGNOSIS — F17210 Nicotine dependence, cigarettes, uncomplicated: Secondary | ICD-10-CM | POA: Insufficient documentation

## 2017-01-17 DIAGNOSIS — Z86718 Personal history of other venous thrombosis and embolism: Secondary | ICD-10-CM | POA: Insufficient documentation

## 2017-01-17 DIAGNOSIS — Z79899 Other long term (current) drug therapy: Secondary | ICD-10-CM | POA: Insufficient documentation

## 2017-01-17 DIAGNOSIS — I119 Hypertensive heart disease without heart failure: Secondary | ICD-10-CM

## 2017-01-17 DIAGNOSIS — Z923 Personal history of irradiation: Secondary | ICD-10-CM | POA: Diagnosis not present

## 2017-01-17 DIAGNOSIS — I2510 Atherosclerotic heart disease of native coronary artery without angina pectoris: Secondary | ICD-10-CM | POA: Insufficient documentation

## 2017-01-17 DIAGNOSIS — C3490 Malignant neoplasm of unspecified part of unspecified bronchus or lung: Secondary | ICD-10-CM

## 2017-01-17 DIAGNOSIS — G8929 Other chronic pain: Secondary | ICD-10-CM | POA: Diagnosis not present

## 2017-01-17 DIAGNOSIS — Z803 Family history of malignant neoplasm of breast: Secondary | ICD-10-CM | POA: Insufficient documentation

## 2017-01-17 DIAGNOSIS — Z951 Presence of aortocoronary bypass graft: Secondary | ICD-10-CM | POA: Diagnosis not present

## 2017-01-17 DIAGNOSIS — L02512 Cutaneous abscess of left hand: Secondary | ICD-10-CM

## 2017-01-17 DIAGNOSIS — M549 Dorsalgia, unspecified: Secondary | ICD-10-CM | POA: Insufficient documentation

## 2017-01-17 DIAGNOSIS — Z510 Encounter for antineoplastic radiation therapy: Secondary | ICD-10-CM | POA: Diagnosis not present

## 2017-01-17 DIAGNOSIS — Z801 Family history of malignant neoplasm of trachea, bronchus and lung: Secondary | ICD-10-CM | POA: Insufficient documentation

## 2017-01-17 DIAGNOSIS — Z85118 Personal history of other malignant neoplasm of bronchus and lung: Secondary | ICD-10-CM

## 2017-01-17 DIAGNOSIS — E785 Hyperlipidemia, unspecified: Secondary | ICD-10-CM

## 2017-01-17 DIAGNOSIS — R110 Nausea: Secondary | ICD-10-CM

## 2017-01-17 DIAGNOSIS — Z7902 Long term (current) use of antithrombotics/antiplatelets: Secondary | ICD-10-CM | POA: Diagnosis not present

## 2017-01-17 DIAGNOSIS — Z800 Family history of malignant neoplasm of digestive organs: Secondary | ICD-10-CM

## 2017-01-17 DIAGNOSIS — E876 Hypokalemia: Secondary | ICD-10-CM

## 2017-01-17 DIAGNOSIS — Z955 Presence of coronary angioplasty implant and graft: Secondary | ICD-10-CM | POA: Insufficient documentation

## 2017-01-17 DIAGNOSIS — K228 Other specified diseases of esophagus: Secondary | ICD-10-CM | POA: Diagnosis not present

## 2017-01-17 DIAGNOSIS — E860 Dehydration: Secondary | ICD-10-CM

## 2017-01-17 DIAGNOSIS — Z7189 Other specified counseling: Secondary | ICD-10-CM

## 2017-01-17 LAB — CBC WITH DIFFERENTIAL/PLATELET
BASOS ABS: 0 10*3/uL (ref 0–0.1)
BASOS PCT: 1 %
EOS ABS: 0.1 10*3/uL (ref 0–0.7)
Eosinophils Relative: 3 %
HCT: 35.4 % — ABNORMAL LOW (ref 40.0–52.0)
HEMOGLOBIN: 13 g/dL (ref 13.0–18.0)
Lymphocytes Relative: 26 %
Lymphs Abs: 0.8 10*3/uL — ABNORMAL LOW (ref 1.0–3.6)
MCH: 35.3 pg — ABNORMAL HIGH (ref 26.0–34.0)
MCHC: 36.7 g/dL — AB (ref 32.0–36.0)
MCV: 96.2 fL (ref 80.0–100.0)
MONOS PCT: 12 %
Monocytes Absolute: 0.3 10*3/uL (ref 0.2–1.0)
NEUTROS PCT: 58 %
Neutro Abs: 1.7 10*3/uL (ref 1.4–6.5)
Platelets: 191 10*3/uL (ref 150–440)
RBC: 3.67 MIL/uL — ABNORMAL LOW (ref 4.40–5.90)
RDW: 14.8 % — ABNORMAL HIGH (ref 11.5–14.5)
WBC: 3 10*3/uL — AB (ref 3.8–10.6)

## 2017-01-17 LAB — COMPREHENSIVE METABOLIC PANEL
ALBUMIN: 3.1 g/dL — AB (ref 3.5–5.0)
ALT: 18 U/L (ref 17–63)
AST: 25 U/L (ref 15–41)
Alkaline Phosphatase: 75 U/L (ref 38–126)
Anion gap: 8 (ref 5–15)
BUN: 6 mg/dL (ref 6–20)
CHLORIDE: 104 mmol/L (ref 101–111)
CO2: 24 mmol/L (ref 22–32)
CREATININE: 0.64 mg/dL (ref 0.61–1.24)
Calcium: 8.4 mg/dL — ABNORMAL LOW (ref 8.9–10.3)
GFR calc Af Amer: >60 mL/min (ref >60)
GFR calc non Af Amer: >60 mL/min (ref >60)
GLUCOSE: 89 mg/dL (ref 65–99)
POTASSIUM: 3.3 mmol/L — AB (ref 3.5–5.1)
SODIUM: 136 mmol/L (ref 135–145)
Total Bilirubin: 0.6 mg/dL (ref 0.3–1.2)
Total Protein: 6.3 g/dL — ABNORMAL LOW (ref 6.5–8.1)

## 2017-01-17 MED ORDER — SODIUM CHLORIDE 0.9% FLUSH
10.0000 mL | Freq: Once | INTRAVENOUS | Status: AC
  Administered 2017-01-17: 10 mL via INTRAVENOUS
  Filled 2017-01-17: qty 10

## 2017-01-17 MED ORDER — OXYCODONE HCL 5 MG PO TABS: 5.0000 mg | ORAL_TABLET | ORAL | 0 refills | Status: DC | PRN

## 2017-01-17 MED ORDER — SODIUM CHLORIDE 0.9 % IV SOLN
Freq: Once | INTRAVENOUS | Status: AC
  Administered 2017-01-17: 11:00:00 via INTRAVENOUS
  Filled 2017-01-17: qty 1000

## 2017-01-17 MED ORDER — SODIUM CHLORIDE 0.9 % IV SOLN
Freq: Once | INTRAVENOUS | Status: AC
  Administered 2017-01-17: 12:00:00 via INTRAVENOUS
  Filled 2017-01-17: qty 4

## 2017-01-17 MED ORDER — HEPARIN SOD (PORK) LOCK FLUSH 100 UNIT/ML IV SOLN: 500.0000 [IU] | Freq: Once | INTRAVENOUS | Status: AC

## 2017-01-17 MED ORDER — POTASSIUM CHLORIDE 20 MEQ/100ML IV SOLN
20.0000 meq | Freq: Once | INTRAVENOUS | Status: AC
  Administered 2017-01-17: 20 meq via INTRAVENOUS
  Filled 2017-01-17: qty 100

## 2017-01-17 MED ORDER — OXYCODONE HCL ER 10 MG PO T12A: 10.0000 mg | EXTENDED_RELEASE_TABLET | Freq: Two times a day (BID) | ORAL | 0 refills | Status: DC

## 2017-01-17 MED ORDER — DEXAMETHASONE 4 MG PO TABS: 4.0000 mg | ORAL_TABLET | Freq: Every day | ORAL | 0 refills | Status: DC

## 2017-01-17 MED ORDER — ONDANSETRON HCL 8 MG PO TABS
8.0000 mg | ORAL_TABLET | Freq: Three times a day (TID) | ORAL | 0 refills | Status: DC | PRN
Start: 2017-01-17 — End: 2017-05-17

## 2017-01-17 NOTE — Progress Notes (Signed)
Patient states he has been nauseated and vomiting since last Friday. States he is having pain in his back and chest from all the vomiting.  States he cannot eat or drink water because of his radiation.  Was prescribed carafate.  Also Dr. Donella Stade prescribed 4 mg decadron today.

## 2017-01-17 NOTE — Progress Notes (Signed)
[facility] day:  01/17/2017   Chief Complaint: Philip [name] is a 56 y.o. male with extensive stage small cell lung cancer who is seen for reassessment after interval thoracic spine radiation.  HPI:  The patient was last seen in the medical oncology clinic on 11/13/2016.  At that time, he had an abscess involving the left thenar eminence.  He had chronic back discomfort.  End of therapy PET scan revealed a new 10 mm hypermetabolic left axillary lymph node (tender on exam) on the same side as his abscess.  He was referred to surgery.  We discussed enrollment on Abb[employer]ie clinical trial (M16-298). He was referred to radiation oncology for PCI.  We discussed referral to the AbbVie clinical trial (M16-298).  He was to return to clinic after surgery.  He was admitted to [facility] rom 11/15/2016 - 11/18/2016.  He underwent surgical debridement of his left hand abscess on 11/15/2016 by Dr. Roseanne Kaufman.  Wound cultures grew MRSA.  He was treated with emperic IV Vancomycin and transitioned to Bactrim.   He was seen by Dr. Baruch Gouty on 12/12/2016.  Discussions were held regarding a palliative course of radiation to his thoracic spine (3000 cGy in 10 fractions) and PCI (3000 cGy in 15 fractions).  Radiation completed on 01/11/2017.  Symptomatically, he complains of "really bad" esophageal burning. Patient has nausea and vomiting when he eats. Patient using Phenergan (suppositories and pills) and Zofran. Patient started on Carafate TID a few days ago. He is on PPI therapy for reflux. Patient notes that steroids were prescribed by Dr. Baruch Gouty.  He states, "I feel like I have a big ball of phlegm in my throat all of the time". Patient is scheduled to begin PCI on 01/28/2017, with plans for 17 fractions.   Patient has discoloration of the toes on his LEFT foot. He is seeing Dr. Fletcher Anon in consult for this issue. He has an ultrasound planned per his report.     Past Medical History:  Diagnosis Date  . Anemia   . CAD (coronary artery disease)    a. 08/2014 Inf STEMI/CABG x 3 (LIMA->LAD, VG->Diag, RIMA->RCA).  . DVT, recurrent, lower extremity, acute, left ([place]) 2016  . Hyperlipidemia   . Hypertension   . Hypertensive heart disease   . Lung cancer ([place])   . PAD (peripheral artery disease) ([place])    a. 03/2015 Periph Angio: short occlusion of L pop w/ evidence of embolization into the DP->5.0x50 mm Inova self-expanding stent;  b. 04/08/2015 ABI: R 1.12, L 0.99.  . Tobacco abuse   . Toe amputation status, left [facility]) 07/09/2016   2017    Past Surgical History:  Procedure Laterality Date  . CARDIAC CATHETERIZATION N/A 08/24/2014   Procedure: Left Heart Cath and Coronary Angiography;  Surgeon: Isaias Cowman, MD;  Location: [place] [facility];  Service: Cardiovascular;  Laterality: N/A;  . CARDIAC CATHETERIZATION N/A 08/24/2014   Procedure: Coronary Stent Intervention;  Surgeon: Isaias Cowman, MD;  Location: [place] [facility];  Service: Cardiovascular;  Laterality: N/A;  . CORONARY ARTERY BYPASS GRAFT N/A 08/25/2014   Procedure: CORONARY ARTERY BYPASS GRAFTING (CABG), ON PUMP, TIMES THREE, USING BILATERAL MAMMARY ARTERIES, RIGHT GREATER SAPHENOUS VEIN HARVESTED ENDOSCOPICALLY;  Surgeon: Melrose Nakayama, MD;  Location: [place];  Service: Open Heart Surgery;  Laterality: N/A;  . INCISION AND DRAINAGE ABSCESS Left 11/15/2016   Procedure: INCISION AND DRAINAGE ABSCESS OF LEFT WRIST;  Surgeon: Roseanne Kaufman, MD;  Location: [place];  Service: Orthopedics;  Laterality: Left;  . PERIPHERAL VASCULAR CATHETERIZATION N/A 03/30/2015   Procedure: Abdominal Aortogram w/Lower Extremity;  Surgeon: Wellington Hampshire, MD;  Location: [place] [facility];  Service: Cardiovascular;  Laterality: N/A;  . PORTA CATH INSERTION N/A 07/27/2016   Procedure: Glori Luis Cath Insertion;  Surgeon: Algernon Huxley, MD;  Location: [place] [facility];  Service: Cardiovascular;   Laterality: N/A;  . TEE WITHOUT CARDIOVERSION N/A 08/25/2014   Procedure: TRANSESOPHAGEAL ECHOCARDIOGRAM (TEE);  Surgeon: Melrose Nakayama, MD;  Location: [place];  Service: Open Heart Surgery;  Laterality: N/A;    Family History  Problem Relation Age of Onset  . Hypertension Father   . Lung cancer Father   . Heart attack Mother 40       STENT  . Hyperlipidemia Mother   . Heart attack Brother   . Heart attack Brother   . Breast cancer Sister   . Heart attack Sister   . Colon cancer Paternal Grandfather     Social History:  reports that he has been smoking Cigarettes.  He has been smoking about 0.50 packs per day. He has never used smokeless tobacco. He reports that he drinks alcohol. He reports that he does not use drugs.  The patient smokes 1/2 pack of cigarettes/day (previously 2 1/2 ppd).  He started smoking in his teens.  He denies any exposure to radiation or toxins.  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  He lives in [place].  His wife is named Publishing copy.  He is accompanied by his wife today.  Allergies:  Allergies  Allergen Reactions  . Amitriptyline Other (See Comments)    Burning sensation all over  . Contrast Media [Iodinated Diagnostic Agents] Other (See Comments)    Pt states that he got "knots behind his ears"  . Nsaids Other (See Comments)    Blood in stools  . Ibuprofen Other (See Comments) and Nausea Only    Blood in stools  . Tape Rash and Other (See Comments)    PLEASE USE COBAN WRAP; THE PATIENT'S SKIN TEARS WHEN BANDAGES ARE REMOVED!!    Current Medications: Current Outpatient Prescriptions  Medication Sig Dispense Refill  . atorvastatin (LIPITOR) 40 MG tablet TAKE 1 TABLET BY MOUTH DAILY (Patient taking differently: Take 40 mg by mouth once a day) 90 tablet 3  . Calcium Carb-Cholecalciferol (CALCIUM + D3 PO) Take 1 tablet by mouth 3 (three) times daily.    . clopidogrel (PLAVIX) 75 MG tablet Take 1 tablet (75 mg total) by mouth daily. 90  tablet 3  . dexamethasone (DECADRON) 4 MG tablet Take 1 tablet (4 mg total) by mouth daily. 35 tablet 0  . diphenhydrAMINE (BENADRYL) 25 mg capsule Take 25 mg by mouth 2 (two) times daily as needed for allergies.     Marland Kitchen lidocaine-prilocaine (EMLA) cream Apply to affected area once (Patient taking differently: Apply 1 application topically See admin instructions. TO AFFECTED AREA AS DIRECTED) 30 g 3  . lisinopril (PRINIVIL,ZESTRIL) 5 MG tablet Take 1 tablet (5 mg total) by mouth daily. 90 tablet 1  . metoprolol tartrate (LOPRESSOR) 25 MG tablet TAKE 1 TABLET BY MOUTH TWICE DAILY 180 tablet 3  . omeprazole (PRILOSEC) 20 MG capsule Take 20 mg by mouth daily.    . ondansetron (ZOFRAN) 8 MG tablet Take 1 tablet (8 mg total) by mouth 2 (two) times daily as needed for refractory nausea / vomiting. Start on day  3 after carboplatin chemo. 30 tablet 1  . oxyCODONE (OXY IR/ROXICODONE) 5 MG immediate release tablet Take 1 tablet (5 mg total) by mouth every 4 (four) hours as needed for severe pain. 40 tablet 0  . oxyCODONE (OXYCONTIN) 10 mg 12 hr tablet Take 1 tablet (10 mg total) by mouth every 12 (twelve) hours. 40 tablet 0  . promethazine (PHENERGAN) 25 MG suppository Place 1 suppository (25 mg total) rectally every 6 (six) hours as needed for nausea or vomiting. 12 each 5  . sucralfate (CARAFATE) 1 g tablet Take 1 tablet (1 g total) by mouth 3 (three) times daily. Dissolve in 2-3 tbsp warm water, swish and swallow 90 tablet 3   No current facility-administered medications for this visit.    Facility-Administered Medications Ordered in Other Visits  Medication Dose Route Frequency Provider Last Rate Last Dose  . heparin lock flush 100 unit/mL  500 Units Intravenous Once Lequita Asal, MD        Review of Systems:  GENERAL:  Feels "ok".  No fevers or sweats.  Weight down 6 pounds since 11/13/2016. PERFORMANCE STATUS (ECOG):  1 HEENT:  No visual changes, runny nose, sore throat, mouth sores or  tenderness. Lungs: No shortness of breath or cough.  No hemoptysis. Cardiac:  No chest pain, palpitations, orthopnea, or PND. GI:  Esophageal burning (see HPI).  Nausea managed with Phenergan and Zofran.  No vomiting, diarrhea, constipation, melena or hematochezia.  Colonoscopy 30 years ago.   GU:  No urgency, frequency, dysuria, or hematuria. Musculoskeletal:  Back pain (see HPI).  No joint pain.  No muscle tenderness. Extremities:  No pain or swelling. Left toe removed (old). Discoloration of toes (see HPI). Skin:  Left hand abscess.  No rashes or skin changes. Neuro:  No headache, numbness or weakness, balance or coordination issues. Endocrine:  No diabetes, thyroid issues, hot flashes or night sweats. Psych:  No mood changes, depression or anxiety. Pain:  Back and throat pain 7 out of 10. Review of systems:  All other systems reviewed and found to be negative.  Physical Exam: Blood pressure (!) 139/94, pulse 84, temperature (!) 95.9 F (35.5 C), temperature source Tympanic, resp. rate 18, weight 140 lb 7 oz (63.7 kg). GENERAL:  Well developed, well nourished, gentleman sitting comfortably in the exam room in no acute distress. MENTAL STATUS: Alert and oriented to person, place and time. HEAD:Alopecia.  Normocephalic, atraumatic, face symmetric, no Cushingoid features. EYES:[place]ney[place]. No scleral hemorrhage.  Pupils equal round and reactive to light and accomodation. No conjunctivitis or scleral icterus. FXT:[id number] clear without lesion. Tonguenormal. Poor dentition. Mucous membranes moist. RESPIRATORY:Clear to auscultationwithout rales, wheezes or rhonchi. CARDIOVASCULAR:Regular rate andrhythmwithout murmur, rub or gallop. ABDOMEN:Soft, non-tender, with active bowel sounds, and no hepatosplenomegaly. No masses. SKIN: Left hand well healed.  No evidence of infection. EXTREMITIES: No lower extremity edema, no skin discoloration or tenderness. No palpable  cords. LYMPHNODES: No palpable cervical, supraclavicular, axillary or inguinal adenopathy  NEUROLOGICAL: Unremarkable. PSYCH: Appropriate.   Infusion on 01/17/2017  Component Date Value Ref Range Status  . WBC 01/17/2017 3.0* 3.8 - 10.6 K/uL Final  . RBC 01/17/2017 3.67* 4.40 - 5.90 MIL/uL Final  . Hemoglobin 01/17/2017 13.0  13.0 - 18.0 g/dL Final   RESULT REPEATED AND VERIFIED  . HCT 01/17/2017 35.4* 40.0 - 52.0 % Final   RESULT REPEATED AND VERIFIED  . MCV 01/17/2017 96.2  80.0 - 100.0 fL Final  . MCH 01/17/2017 35.3* 26.0 - 34.0 pg Final  .  MCHC 01/17/2017 36.7* 32.0 - 36.0 g/dL Final  . RDW 01/17/2017 14.8* 11.5 - 14.5 % Final  . Platelets 01/17/2017 191  150 - 440 K/uL Final  . Neutrophils Relative % 01/17/2017 58  % Final  . Neutro Abs 01/17/2017 1.7  1.4 - 6.5 K/uL Final  . Lymphocytes Relative 01/17/2017 26  % Final  . Lymphs Abs 01/17/2017 0.8* 1.0 - 3.6 K/uL Final  . Monocytes Relative 01/17/2017 12  % Final  . Monocytes Absolute 01/17/2017 0.3  0.2 - 1.0 K/uL Final  . Eosinophils Relative 01/17/2017 3  % Final  . Eosinophils Absolute 01/17/2017 0.1  0 - 0.7 K/uL Final  . Basophils Relative 01/17/2017 1  % Final  . Basophils Absolute 01/17/2017 0.0  0 - 0.1 K/uL Final  . Sodium 01/17/2017 136  135 - 145 mmol/L Final  . Potassium 01/17/2017 3.3* 3.5 - 5.1 mmol/L Final  . Chloride 01/17/2017 104  101 - 111 mmol/L Final  . CO2 01/17/2017 24  22 - 32 mmol/L Final  . Glucose, Bld 01/17/2017 89  65 - 99 mg/dL Final  . BUN 01/17/2017 6  6 - 20 mg/dL Final  . Creatinine, Ser 01/17/2017 0.64  0.61 - 1.24 mg/dL Final  . Calcium 01/17/2017 8.4* 8.9 - 10.3 mg/dL Final  . Total Protein 01/17/2017 6.3* 6.5 - 8.1 g/dL Final  . Albumin 01/17/2017 3.1* 3.5 - 5.0 g/dL Final  . AST 01/17/2017 25  15 - 41 U/L Final  . ALT 01/17/2017 18  17 - 63 U/L Final  . Alkaline Phosphatase 01/17/2017 75  38 - 126 U/L Final  . Total Bilirubin 01/17/2017 0.6  0.3 - 1.2 mg/dL Final  . GFR calc  non Af Amer 01/17/2017 >60  >60 mL/min Final  . GFR calc Af Amer 01/17/2017 >60  >60 mL/min Final   Comment: (NOTE) The eGFR has been calculated using the CKD EPI equation. This calculation has not been validated in all clinical situations. eGFR's persistently <60 mL/min signify possible Chronic Kidney Disease.   . Anion gap 01/17/2017 8  5 - 15 Final    Assessment:  Philip [name] is a 56 y.o. male with extensive stage small cell lung cancer.  He presented with a 2 year history of night sweats and a 36 pound weight loss in 6 months.  Right supraclavicular biopsy on 07/19/2016 revealed small cell carcinoma of the lung.  CEA was 1.7 on 07/31/2016.  Chest CT on 06/28/2016 revealed extensive adenopathy.  There were multiple lymph nodes descending thoracic aorta, largest measuring 2.1 x 1.7 cm.  There was adenopathy in the aortopulmonary window with the largest confluence of lymph nodes measuring 4 x 2.6 cm. There was hilar adenopathy on the left measuring 2 x 1.9 cm. There were multiple enlarged lymph nodes in the pretracheal region, largest 1.7 x 1.7 cm.  There were lymph nodes to the left of the carina measuring 2.8 x 1.9 cm. There was a subcarinal lymph node measuring 2.4 x 1.6 cm. There was a 4 x 3 mm nodular opacity in the anterior segment of the left upper lobe.    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  There was nodal metastasis within the chest and supraclavicular/low jugular regions.  There was multifocal osseous metastasis.  There was possibly a pathologic fracture involving the posteromedial right tenth rib.  There was new left lower lobe opacity with mild hypermetabolism, suspicious for infection or postobstructive pneumonitis.  He completed  a course of Levaquin for apost-obstructive pneumonia.  He experienced transient positional left facial burning.  Head MRI on 07/26/2016 revealed no evidence of brain metastasis. There was  punctate DWI hyperintensity along a high right frontal gyrus, possible recent infarct.  There was a small occipital bone metastasis.  Head MRA on 07/27/2016 revealed normal large and medium sized vessels.  Bilateral carotid duplex on 07/27/2016 revealed <50% stenosis in the right and left internal carotis arteries.  Echo on 07/27/2016 revealed an EF of 45-50% without cardiac source of emboli.  He received 4 cycles of carboplatin and etoposide (07/31/2016 - 10/02/2016) with OnPro Neulasta support.  Platelet nadir was 40,000 on day 16 of cycle #2 and 23,000 on day 15 of cycle #3.  He received 2 units of PRBC with cycle #3 and 1 unit with cycle #4.  He received Xgeva on 08/13/2016 (last 10/30/2016).  Chest CT on 09/11/2016 revealed a marked response to therapy.  There was near complete resolution of left suprahilar mass and thoracic adenopathy.  There was interval sclerosis indicative of healing site of osseous metastasis.  Thoracic spine MRI on 10/22/2016 reveals sclerotic enhancing lesion at T7 vertebral body on the left compatible with metastasis. There was a healing fracture the right medial 10th rib.  PET scan on 11/12/2016 revealed a new 10 mm hypermetabolic left axillary lymph node (SUV 3.6).  There were no other sites of metabolically active disease.  He completed a course of thoracic spine radiation (3000 cGy in 10 fractions) on 01/11/2017.  He is scheduled to begin PCI (3000 cGy in 15 fractions) on 01/28/2017.  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  Anemia work-up on 05/29/.2018 revealed the following normal labs:  Ferritin (219), iron sat (28%), TIBC (227), B12 (3055), and folate (6.8).  He has a history of left lower extremity DVT a few months after his cardiac surgery in 08/2014.  He has peripheral vascular disease s/p stent placement in 03/2015.  He is on a Plavix.  He has not had a colonoscopy in 30 years.  He was ineligible for the AbbVie clinical trial (M16-298) secondary to his  left hand abscess.  He underwent surgical debridement of a left hand abscess on 11/15/2016.  Wound cultures grew MRSA.  He was treated with emperic IV Vancomycin then Bactrim.   Symptomatically, he has " esophageal burning", nausea, and some vomiting.  He has chronic mid back discomfort. WBC is 3,000 with an [place] of 1700. Hemoglobin 13.0. Potassium is 3.3.  Plan: 1.  Labs today:  CBC with diff, CMP. 2.  Discuss interval hand surgery- well healed.  Infection resolved. 3.  Discuss interval thoracic spine radiation.  Obtain radiation summary. 4.  Discuss plan for follow-up: every 2 months for year , every 3-4 months for years 2-3, every 6 months for years 4-5 then annually. 5.  NS 1000cc today. Will add in 20 mEq Potassium and 8 mg IV Zofran 6.  RTC on 01/18/2017 for NS 1000cc and Zofran 73m IV.  7.  Rx: Zofran 8 mg PO q 8 PRN (disp #: 30) 8.  Continue Carafate, PPI, and Decadron for esophageal irritation post radiation. 9.  Rx:  Oxycontin 10 mg po q 12 hours (dis: #40), oxycodone 5 mg po q 4 hours prn pain (dis: #40). 10.  RTC on 01/21/2017 for MD assessment, labs (CBC with , BMP, Mg), +/- IVF and Zofran   BHonor Loh NP  01/17/2017, 10:40 AM   I saw and evaluated the patient, participating in  the key portions of the service and reviewing pertinent diagnostic studies and records.  I reviewed the nurse practitioner's note and agree with the findings and the plan.  The assessment and plan were discussed with the patient.  Multiple questions were asked by the patient and answered.   Nolon Stalls, MD 01/17/2017

## 2017-01-18 ENCOUNTER — Ambulatory Visit (INDEPENDENT_AMBULATORY_CARE_PROVIDER_SITE_OTHER): Payer: Medicaid Other

## 2017-01-18 ENCOUNTER — Inpatient Hospital Stay: Payer: Medicaid Other

## 2017-01-18 ENCOUNTER — Other Ambulatory Visit: Payer: Self-pay | Admitting: Hematology and Oncology

## 2017-01-18 DIAGNOSIS — I739 Peripheral vascular disease, unspecified: Secondary | ICD-10-CM | POA: Diagnosis not present

## 2017-01-18 DIAGNOSIS — I779 Disorder of arteries and arterioles, unspecified: Secondary | ICD-10-CM

## 2017-01-18 DIAGNOSIS — C7951 Secondary malignant neoplasm of bone: Secondary | ICD-10-CM

## 2017-01-18 DIAGNOSIS — C3412 Malignant neoplasm of upper lobe, left bronchus or lung: Secondary | ICD-10-CM | POA: Diagnosis not present

## 2017-01-18 LAB — VAS US LOWER EXTREMITY ARTERIAL DUPLEX
LATIBMIDSYS: 29 cm/s
LPEROMIDSYS: 19 cm/s
LPOPDPSV: -52 cm/s
LPOPPPSV: 50 cm/s
LPTIBMIDSYS: 31 cm/s
LSFPPSV: -160 cm/s
Left super femoral mid sys PSV: -165 cm/s
RIGHT ANT MID TIBIAL SYS PSV: 62 cm/s
RIGHT PERO MID SYS: -55 cm/s
RIGHT POPLITEAL DIST EDV: 0 cm/s
RIGHT POST TIB MID SYS: 58 cm/s
RSFDPSV: -218 cm/s
RSFMPSV: -89 cm/s
RSFPPSV: -279 cm/s
RTSFADISTDIA: -32 cm/s
Right popliteal dist sys PSV: -97 cm/s
Right popliteal prox sys PSV: 113 cm/s

## 2017-01-18 MED ORDER — HEPARIN SOD (PORK) LOCK FLUSH 100 UNIT/ML IV SOLN
INTRAVENOUS | Status: AC
  Filled 2017-01-18: qty 5

## 2017-01-18 MED ORDER — HEPARIN SOD (PORK) LOCK FLUSH 100 UNIT/ML IV SOLN
500.0000 [IU] | Freq: Once | INTRAVENOUS | Status: AC | PRN
  Administered 2017-01-18: 500 [IU]

## 2017-01-18 MED ORDER — SODIUM CHLORIDE 0.9 % IV SOLN
Freq: Once | INTRAVENOUS | Status: AC
  Administered 2017-01-18: 08:00:00 via INTRAVENOUS
  Filled 2017-01-18: qty 4

## 2017-01-18 MED ORDER — SODIUM CHLORIDE 0.9 % IV SOLN
Freq: Once | INTRAVENOUS | Status: AC
  Administered 2017-01-18: 08:00:00 via INTRAVENOUS
  Filled 2017-01-18: qty 1000

## 2017-01-20 ENCOUNTER — Encounter: Payer: Self-pay | Admitting: Hematology and Oncology

## 2017-01-20 DIAGNOSIS — E876 Hypokalemia: Secondary | ICD-10-CM | POA: Insufficient documentation

## 2017-01-20 DIAGNOSIS — R112 Nausea with vomiting, unspecified: Secondary | ICD-10-CM | POA: Insufficient documentation

## 2017-01-20 DIAGNOSIS — E860 Dehydration: Secondary | ICD-10-CM | POA: Insufficient documentation

## 2017-01-21 ENCOUNTER — Other Ambulatory Visit: Payer: Self-pay | Admitting: *Deleted

## 2017-01-21 ENCOUNTER — Inpatient Hospital Stay: Payer: Medicaid Other

## 2017-01-21 ENCOUNTER — Inpatient Hospital Stay (HOSPITAL_BASED_OUTPATIENT_CLINIC_OR_DEPARTMENT_OTHER): Payer: Medicaid Other | Admitting: Hematology and Oncology

## 2017-01-21 VITALS — BP 148/90 | HR 73 | Temp 96.4°F | Resp 20 | Wt 144.9 lb

## 2017-01-21 DIAGNOSIS — I119 Hypertensive heart disease without heart failure: Secondary | ICD-10-CM | POA: Diagnosis not present

## 2017-01-21 DIAGNOSIS — R112 Nausea with vomiting, unspecified: Secondary | ICD-10-CM | POA: Diagnosis not present

## 2017-01-21 DIAGNOSIS — E785 Hyperlipidemia, unspecified: Secondary | ICD-10-CM

## 2017-01-21 DIAGNOSIS — E876 Hypokalemia: Secondary | ICD-10-CM

## 2017-01-21 DIAGNOSIS — Z955 Presence of coronary angioplasty implant and graft: Secondary | ICD-10-CM

## 2017-01-21 DIAGNOSIS — L02512 Cutaneous abscess of left hand: Secondary | ICD-10-CM | POA: Diagnosis not present

## 2017-01-21 DIAGNOSIS — Z86718 Personal history of other venous thrombosis and embolism: Secondary | ICD-10-CM

## 2017-01-21 DIAGNOSIS — Z7902 Long term (current) use of antithrombotics/antiplatelets: Secondary | ICD-10-CM

## 2017-01-21 DIAGNOSIS — Z803 Family history of malignant neoplasm of breast: Secondary | ICD-10-CM

## 2017-01-21 DIAGNOSIS — C7951 Secondary malignant neoplasm of bone: Secondary | ICD-10-CM

## 2017-01-21 DIAGNOSIS — I739 Peripheral vascular disease, unspecified: Secondary | ICD-10-CM | POA: Diagnosis not present

## 2017-01-21 DIAGNOSIS — Z85118 Personal history of other malignant neoplasm of bronchus and lung: Secondary | ICD-10-CM

## 2017-01-21 DIAGNOSIS — C3412 Malignant neoplasm of upper lobe, left bronchus or lung: Secondary | ICD-10-CM

## 2017-01-21 DIAGNOSIS — F17210 Nicotine dependence, cigarettes, uncomplicated: Secondary | ICD-10-CM

## 2017-01-21 DIAGNOSIS — K219 Gastro-esophageal reflux disease without esophagitis: Secondary | ICD-10-CM

## 2017-01-21 DIAGNOSIS — I2510 Atherosclerotic heart disease of native coronary artery without angina pectoris: Secondary | ICD-10-CM

## 2017-01-21 DIAGNOSIS — Z801 Family history of malignant neoplasm of trachea, bronchus and lung: Secondary | ICD-10-CM

## 2017-01-21 DIAGNOSIS — Z8614 Personal history of Methicillin resistant Staphylococcus aureus infection: Secondary | ICD-10-CM

## 2017-01-21 DIAGNOSIS — Z923 Personal history of irradiation: Secondary | ICD-10-CM | POA: Diagnosis not present

## 2017-01-21 DIAGNOSIS — Z510 Encounter for antineoplastic radiation therapy: Secondary | ICD-10-CM | POA: Diagnosis not present

## 2017-01-21 DIAGNOSIS — Z8701 Personal history of pneumonia (recurrent): Secondary | ICD-10-CM

## 2017-01-21 DIAGNOSIS — Z79899 Other long term (current) drug therapy: Secondary | ICD-10-CM

## 2017-01-21 DIAGNOSIS — Z951 Presence of aortocoronary bypass graft: Secondary | ICD-10-CM

## 2017-01-21 DIAGNOSIS — R110 Nausea: Secondary | ICD-10-CM

## 2017-01-21 DIAGNOSIS — Z7189 Other specified counseling: Secondary | ICD-10-CM

## 2017-01-21 DIAGNOSIS — Z800 Family history of malignant neoplasm of digestive organs: Secondary | ICD-10-CM

## 2017-01-21 LAB — CBC WITH DIFFERENTIAL/PLATELET
Basophils Absolute: 0 10*3/uL (ref 0–0.1)
Basophils Relative: 0 %
Eosinophils Absolute: 0 10*3/uL (ref 0–0.7)
Eosinophils Relative: 0 %
HCT: 34.4 % — ABNORMAL LOW (ref 40.0–52.0)
Hemoglobin: 11.9 g/dL — ABNORMAL LOW (ref 13.0–18.0)
Lymphocytes Relative: 12 %
Lymphs Abs: 0.5 10*3/uL — ABNORMAL LOW (ref 1.0–3.6)
MCH: 34.7 pg — ABNORMAL HIGH (ref 26.0–34.0)
MCHC: 34.7 g/dL (ref 32.0–36.0)
MCV: 100 fL (ref 80.0–100.0)
Monocytes Absolute: 0.3 10*3/uL (ref 0.2–1.0)
Monocytes Relative: 8 %
Neutro Abs: 3.6 10*3/uL (ref 1.4–6.5)
Neutrophils Relative %: 80 %
Platelets: 167 10*3/uL (ref 150–440)
RBC: 3.45 MIL/uL — ABNORMAL LOW (ref 4.40–5.90)
RDW: 15.3 % — ABNORMAL HIGH (ref 11.5–14.5)
WBC: 4.4 10*3/uL (ref 3.8–10.6)

## 2017-01-21 LAB — COMPREHENSIVE METABOLIC PANEL
ALT: 23 U/L (ref 17–63)
AST: 32 U/L (ref 15–41)
Albumin: 3.2 g/dL — ABNORMAL LOW (ref 3.5–5.0)
Alkaline Phosphatase: 63 U/L (ref 38–126)
Anion gap: 8 (ref 5–15)
BUN: 11 mg/dL (ref 6–20)
CO2: 27 mmol/L (ref 22–32)
Calcium: 8.6 mg/dL — ABNORMAL LOW (ref 8.9–10.3)
Chloride: 102 mmol/L (ref 101–111)
Creatinine, Ser: 0.92 mg/dL (ref 0.61–1.24)
GFR calc Af Amer: >60 mL/min (ref >60)
GFR calc non Af Amer: >60 mL/min (ref >60)
Glucose, Bld: 96 mg/dL (ref 65–99)
Potassium: 3.8 mmol/L (ref 3.5–5.1)
Sodium: 137 mmol/L (ref 135–145)
Total Bilirubin: 0.6 mg/dL (ref 0.3–1.2)
Total Protein: 6.2 g/dL — ABNORMAL LOW (ref 6.5–8.1)

## 2017-01-21 LAB — MAGNESIUM: Magnesium: 1.5 mg/dL — ABNORMAL LOW (ref 1.7–2.4)

## 2017-01-21 MED ORDER — SODIUM CHLORIDE 0.9 % IV SOLN
Freq: Once | INTRAVENOUS | Status: DC
  Filled 2017-01-21: qty 1000

## 2017-01-21 MED ORDER — SODIUM CHLORIDE 0.9 % IV SOLN
1.0000 g | Freq: Once | INTRAVENOUS | Status: AC
  Administered 2017-01-21: 1 g via INTRAVENOUS
  Filled 2017-01-21: qty 2

## 2017-01-21 MED ORDER — HEPARIN SOD (PORK) LOCK FLUSH 100 UNIT/ML IV SOLN
500.0000 [IU] | Freq: Once | INTRAVENOUS | Status: AC | PRN
  Administered 2017-01-21: 500 [IU]
  Filled 2017-01-21: qty 5

## 2017-01-21 MED ORDER — SODIUM CHLORIDE 0.9% FLUSH
10.0000 mL | INTRAVENOUS | Status: DC | PRN
  Administered 2017-01-21: 10 mL
  Filled 2017-01-21: qty 10

## 2017-01-21 MED ORDER — SODIUM CHLORIDE 0.9 % IV SOLN
Freq: Once | INTRAVENOUS | Status: AC
  Administered 2017-01-21: 15:00:00 via INTRAVENOUS
  Filled 2017-01-21: qty 1000

## 2017-01-21 NOTE — Progress Notes (Signed)
Here for follow up. See added comments under follow up.

## 2017-01-21 NOTE — Progress Notes (Signed)
[facility] day:  01/21/2017   Chief Complaint: Philip [name] is a 56 y.o. male with extensive stage small cell lung cancer who is seen for 1 week assessment.  HPI:  The patient was last seen in the medical oncology clinic on 01/17/2017.  At that time, he described " esophageal burning", nausea, and some vomiting.  He has chronic mid back discomfort s/p completion of palliative thoracic spine radiation.  WBC was 3,000 with an [place] of 1700. Hemoglobin was 13.0. Potassium was 3.3.  He received 1 liter NS + potassium + ondansetron on 01/17/2017.  He received 1 liter NS + ondansetron on 01/18/2017.  He is scheduled to begin PCI on 01/28/2017.  During the interim, patient doing "better". Patient continues to have some nausea and vomiting, however it has improved. Patient is using Zofran and Phenergan. He is able to eat better. Patient has gained 4 pounds. Patient continues to experience esophageal burning. He notes some improvement with the liquid Carafate that he is using. Patient denies any shortness of breath. He denies B symptoms. No interval infections. Patient states, "I feel good today. After the second time ya'll gave me fluids, I felt better".    Past Medical History:  Diagnosis Date  . Anemia   . CAD (coronary artery disease)    a. 08/2014 Inf STEMI/CABG x 3 (LIMA->LAD, VG->Diag, RIMA->RCA).  . DVT, recurrent, lower extremity, acute, left ([place]) 2016  . Hyperlipidemia   . Hypertension   . Hypertensive heart disease   . Lung cancer ([place])   . PAD (peripheral artery disease) ([place])    a. 03/2015 Periph Angio: short occlusion of L pop w/ evidence of embolization into the DP->5.0x50 mm Inova self-expanding stent;  b. 04/08/2015 ABI: R 1.12, L 0.99.  . Tobacco abuse   . Toe amputation status, left [facility]) 07/09/2016   2017    Past Surgical History:  Procedure Laterality Date  . CARDIAC CATHETERIZATION N/A 08/24/2014   Procedure: Left Heart  Cath and Coronary Angiography;  Surgeon: Isaias Cowman, MD;  Location: [place] [facility];  Service: Cardiovascular;  Laterality: N/A;  . CARDIAC CATHETERIZATION N/A 08/24/2014   Procedure: Coronary Stent Intervention;  Surgeon: Isaias Cowman, MD;  Location: [place] [facility];  Service: Cardiovascular;  Laterality: N/A;  . CORONARY ARTERY BYPASS GRAFT N/A 08/25/2014   Procedure: CORONARY ARTERY BYPASS GRAFTING (CABG), ON PUMP, TIMES THREE, USING BILATERAL MAMMARY ARTERIES, RIGHT GREATER SAPHENOUS VEIN HARVESTED ENDOSCOPICALLY;  Surgeon: Melrose Nakayama, MD;  Location: [place];  Service: Open Heart Surgery;  Laterality: N/A;  . INCISION AND DRAINAGE ABSCESS Left 11/15/2016   Procedure: INCISION AND DRAINAGE ABSCESS OF LEFT WRIST;  Surgeon: Roseanne Kaufman, MD;  Location: [place];  Service: Orthopedics;  Laterality: Left;  . PERIPHERAL VASCULAR CATHETERIZATION N/A 03/30/2015   Procedure: Abdominal Aortogram w/Lower Extremity;  Surgeon: Wellington Hampshire, MD;  Location: [place] [facility];  Service: Cardiovascular;  Laterality: N/A;  . PORTA CATH INSERTION N/A 07/27/2016   Procedure: Glori Luis Cath Insertion;  Surgeon: Algernon Huxley, MD;  Location: [place] [facility];  Service: Cardiovascular;  Laterality: N/A;  . TEE WITHOUT CARDIOVERSION N/A 08/25/2014   Procedure: TRANSESOPHAGEAL ECHOCARDIOGRAM (TEE);  Surgeon: Melrose Nakayama, MD;  Location: [place];  Service: Open Heart Surgery;  Laterality: N/A;    Family History  Problem Relation Age of Onset  . Hypertension Father   . Lung cancer Father   . Heart attack Mother 69  STENT  . Hyperlipidemia Mother   . Heart attack Brother   . Heart attack Brother   . Breast cancer Sister   . Heart attack Sister   . Colon cancer Paternal Grandfather     Social History:  reports that he has been smoking Cigarettes.  He has been smoking about 0.50 packs per day. He has never used smokeless tobacco. He reports that he drinks alcohol. He  reports that he does not use drugs.  The patient smokes 1/2 pack of cigarettes/day (previously 2 1/2 ppd).  He started smoking in his teens.  He denies any exposure to radiation or toxins.  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  He lives in [place].  His wife is named Publishing copy.  He is accompanied by his wife today.  Allergies:  Allergies  Allergen Reactions  . Amitriptyline Other (See Comments)    Burning sensation all over  . Contrast Media [Iodinated Diagnostic Agents] Other (See Comments)    Pt states that he got "knots behind his ears"  . Nsaids Other (See Comments)    Blood in stools  . Ibuprofen Other (See Comments) and Nausea Only    Blood in stools  . Tape Rash and Other (See Comments)    PLEASE USE COBAN WRAP; THE PATIENT'S SKIN TEARS WHEN BANDAGES ARE REMOVED!!    Current Medications: Current Outpatient Prescriptions  Medication Sig Dispense Refill  . atorvastatin (LIPITOR) 40 MG tablet TAKE 1 TABLET BY MOUTH DAILY (Patient taking differently: Take 40 mg by mouth once a day) 90 tablet 3  . buPROPion (WELLBUTRIN XL) 150 MG 24 hr tablet TK 1 T PO QD  5  . Calcium Carb-Cholecalciferol (CALCIUM + D3 PO) Take 1 tablet by mouth 3 (three) times daily.    . clopidogrel (PLAVIX) 75 MG tablet Take 1 tablet (75 mg total) by mouth daily. 90 tablet 3  . dexamethasone (DECADRON) 4 MG tablet Take 1 tablet (4 mg total) by mouth daily. 35 tablet 0  . diphenhydrAMINE (BENADRYL) 25 mg capsule Take 25 mg by mouth 2 (two) times daily as needed for allergies.     Marland Kitchen lidocaine-prilocaine (EMLA) cream Apply to affected area once (Patient taking differently: Apply 1 application topically See admin instructions. TO AFFECTED AREA AS DIRECTED) 30 g 3  . lisinopril (PRINIVIL,ZESTRIL) 5 MG tablet Take 1 tablet (5 mg total) by mouth daily. 90 tablet 1  . metoprolol tartrate (LOPRESSOR) 25 MG tablet TAKE 1 TABLET BY MOUTH TWICE DAILY 180 tablet 3  . ondansetron (ZOFRAN) 8 MG tablet Take 1  tablet (8 mg total) by mouth every 8 (eight) hours as needed for nausea or vomiting. 30 tablet 0  . oxyCODONE (OXY IR/ROXICODONE) 5 MG immediate release tablet Take 1 tablet (5 mg total) by mouth every 4 (four) hours as needed for severe pain. 40 tablet 0  . oxyCODONE (OXYCONTIN) 10 mg 12 hr tablet Take 1 tablet (10 mg total) by mouth every 12 (twelve) hours. 40 tablet 0  . pantoprazole (PROTONIX) 40 MG tablet TK 1 T PO QD  5  . promethazine (PHENERGAN) 25 MG suppository Place 1 suppository (25 mg total) rectally every 6 (six) hours as needed for nausea or vomiting. 12 each 5  . sucralfate (CARAFATE) 1 g tablet Take 1 tablet (1 g total) by mouth 3 (three) times daily. Dissolve in 2-3 tbsp warm water, swish and swallow 90 tablet 3   No current facility-administered medications for this visit.  Facility-Administered Medications Ordered in Other Visits  Medication Dose Route Frequency Provider Last Rate Last Dose  . heparin lock flush 100 unit/mL  500 Units Intravenous Once Lequita Asal, MD        Review of Systems:  GENERAL:  Feels "better".  No fevers or sweats.  Weight up 4 pounds.  PERFORMANCE STATUS (ECOG):  1-2 HEENT:  No visual changes, runny nose, sore throat, mouth sores or tenderness. Lungs: No shortness of breath or cough.  No hemoptysis. Cardiac:  No chest pain, palpitations, orthopnea, or PND. GI:  Esophageal burning, improved.  Nausea managed with Phenergan and Zofran.  No vomiting, diarrhea, constipation, melena or hematochezia.    GU:  No urgency, frequency, dysuria, or hematuria. Musculoskeletal:  Back pain.  No joint pain.  No muscle tenderness. Extremities:  No pain or swelling. Left toe removed (old). Discoloration of toes (see HPI). Skin:  Left hand abscess.  No rashes or skin changes. Neuro:  No headache, numbness or weakness, balance or coordination issues. Endocrine:  No diabetes, thyroid issues, hot flashes or night sweats. Psych:  No mood changes, depression  or anxiety. Pain:  Back pain 6 out of 10. Review of systems:  All other systems reviewed and found to be negative.  Physical Exam: Blood pressure (!) 148/90, pulse 73, temperature (!) 96.4 F (35.8 C), temperature source Tympanic, resp. rate 20, weight 144 lb 14.4 oz (65.7 kg). GENERAL:  Well developed, well nourished, gentleman sitting comfortably in the exam room in no acute distress. MENTAL STATUS: Alert and oriented to person, place and time. HEAD:Alopecia.  Normocephalic, atraumatic, face symmetric, no Cushingoid features. EYES:[place]. No scleral hemorrhage.  Pupils equal round and reactive to light and accomodation. No conjunctivitis or scleral icterus. FVC:BSWHQPRFFM clear without lesion. Tonguenormal. Poor dentition. Mucous membranes moist. RESPIRATORY:Clear to auscultationwithout rales, wheezes or rhonchi. CARDIOVASCULAR:Regular rate andrhythmwithout murmur, rub or gallop. ABDOMEN:Soft, non-tender, with active bowel sounds, and no hepatosplenomegaly. No masses. SKIN: Left hand well healed.  No evidence of infection. EXTREMITIES: No lower extremity edema, no skin discoloration or tenderness. No palpable cords. LYMPHNODES: No palpable cervical, supraclavicular, axillary or inguinal adenopathy  NEUROLOGICAL: Unremarkable. PSYCH: Appropriate.   Appointment on 01/21/2017  Component Date Value Ref Range Status  . WBC 01/21/2017 4.4  3.8 - 10.6 K/uL Final  . RBC 01/21/2017 3.45* 4.40 - 5.90 MIL/uL Final  . Hemoglobin 01/21/2017 11.9* 13.0 - 18.0 g/dL Final  . HCT 01/21/2017 34.4* 40.0 - 52.0 % Final  . MCV 01/21/2017 100.0  80.0 - 100.0 fL Final  . MCH 01/21/2017 34.7* 26.0 - 34.0 pg Final  . MCHC 01/21/2017 34.7  32.0 - 36.0 g/dL Final  . RDW 01/21/2017 15.3* 11.5 - 14.5 % Final  . Platelets 01/21/2017 167  150 - 440 K/uL Final  . Neutrophils Relative % 01/21/2017 80  % Final  . Neutro Abs 01/21/2017 3.6  1.4 - 6.5 K/uL Final  . Lymphocytes  Relative 01/21/2017 12  % Final  . Lymphs Abs 01/21/2017 0.5* 1.0 - 3.6 K/uL Final  . Monocytes Relative 01/21/2017 8  % Final  . Monocytes Absolute 01/21/2017 0.3  0.2 - 1.0 K/uL Final  . Eosinophils Relative 01/21/2017 0  % Final  . Eosinophils Absolute 01/21/2017 0.0  0 - 0.7 K/uL Final  . Basophils Relative 01/21/2017 0  % Final  . Basophils Absolute 01/21/2017 0.0  0 - 0.1 K/uL Final  . Sodium 01/21/2017 137  135 - 145 mmol/L Final  . Potassium 01/21/2017 3.8  3.5 - 5.1 mmol/L Final  . Chloride 01/21/2017 102  101 - 111 mmol/L Final  . CO2 01/21/2017 27  22 - 32 mmol/L Final  . Glucose, Bld 01/21/2017 96  65 - 99 mg/dL Final  . BUN 01/21/2017 11  6 - 20 mg/dL Final  . Creatinine, Ser 01/21/2017 0.92  0.61 - 1.24 mg/dL Final  . Calcium 01/21/2017 8.6* 8.9 - 10.3 mg/dL Final  . Total Protein 01/21/2017 6.2* 6.5 - 8.1 g/dL Final  . Albumin 01/21/2017 3.2* 3.5 - 5.0 g/dL Final  . AST 01/21/2017 32  15 - 41 U/L Final  . ALT 01/21/2017 23  17 - 63 U/L Final  . Alkaline Phosphatase 01/21/2017 63  38 - 126 U/L Final  . Total Bilirubin 01/21/2017 0.6  0.3 - 1.2 mg/dL Final  . GFR calc non Af Amer 01/21/2017 >60  >60 mL/min Final  . GFR calc Af Amer 01/21/2017 >60  >60 mL/min Final   Comment: (NOTE) The eGFR has been calculated using the CKD EPI equation. This calculation has not been validated in all clinical situations. eGFR's persistently <60 mL/min signify possible Chronic Kidney Disease.   . Anion gap 01/21/2017 8  5 - 15 Final  . Magnesium 01/21/2017 1.5* 1.7 - 2.4 mg/dL Final    Assessment:  Philip [name] is a 56 y.o. male with extensive stage small cell lung cancer.  He presented with a 2 year history of night sweats and a 36 pound weight loss in 6 months.  Right supraclavicular biopsy on 07/19/2016 revealed small cell carcinoma of the lung.  CEA was 1.7 on 07/31/2016.  Chest CT on 06/28/2016 revealed extensive adenopathy.  There were multiple lymph nodes descending  thoracic aorta, largest measuring 2.1 x 1.7 cm.  There was adenopathy in the aortopulmonary window with the largest confluence of lymph nodes measuring 4 x 2.6 cm. There was hilar adenopathy on the left measuring 2 x 1.9 cm. There were multiple enlarged lymph nodes in the pretracheal region, largest 1.7 x 1.7 cm.  There were lymph nodes to the left of the carina measuring 2.8 x 1.9 cm. There was a subcarinal lymph node measuring 2.4 x 1.6 cm. There was a 4 x 3 mm nodular opacity in the anterior segment of the left upper lobe.    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  There was nodal metastasis within the chest and supraclavicular/low jugular regions.  There was multifocal osseous metastasis.  There was possibly a pathologic fracture involving the posteromedial right tenth rib.  There was new left lower lobe opacity with mild hypermetabolism, suspicious for infection or postobstructive pneumonitis.  He completed a course of Levaquin for apost-obstructive pneumonia.  He experienced transient positional left facial burning.  Head MRI on 07/26/2016 revealed no evidence of brain metastasis. There was punctate DWI hyperintensity along a high right frontal gyrus, possible recent infarct.  There was a small occipital bone metastasis.  Head MRA on 07/27/2016 revealed normal large and medium sized vessels.  Bilateral carotid duplex on 07/27/2016 revealed <50% stenosis in the right and left internal carotis arteries.  Echo on 07/27/2016 revealed an EF of 45-50% without cardiac source of emboli.  He received 4 cycles of carboplatin and etoposide (07/31/2016 - 10/02/2016) with OnPro Neulasta support.  Platelet nadir was 40,000 on day 16 of cycle #2 and 23,000 on day 15 of cycle #3.  He received 2 units of PRBC with cycle #3 and 1 unit with  cycle #4.  He received Xgeva on 08/13/2016 (last 10/30/2016).  Chest CT on 09/11/2016 revealed a marked response to  therapy.  There was near complete resolution of left suprahilar mass and thoracic adenopathy.  There was interval sclerosis indicative of healing site of osseous metastasis.  Thoracic spine MRI on 10/22/2016 reveals sclerotic enhancing lesion at T7 vertebral body on the left compatible with metastasis. There was a healing fracture the right medial 10th rib.  PET scan on 11/12/2016 revealed a new 10 mm hypermetabolic left axillary lymph node (SUV 3.6).  There were no other sites of metabolically active disease.  He completed a course of thoracic spine radiation (3000 cGy in 10 fractions) on 01/11/2017.  He is scheduled to begin PCI (3000 cGy in 15 fractions) on 01/28/2017.  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  Anemia work-up on 05/29/.2018 revealed the following normal labs:  Ferritin (219), iron sat (28%), TIBC (227), B12 (3055), and folate (6.8).  He has a history of left lower extremity DVT a few months after his cardiac surgery in 08/2014.  He has peripheral vascular disease s/p stent placement in 03/2015.  He is on a Plavix.  He has not had a colonoscopy in 30 years.  He was ineligible for the AbbVie clinical trial ([id number]) secondary to his left hand abscess.  He underwent surgical debridement of a left hand abscess on 11/15/2016.  Wound cultures grew MRSA.  He was treated with emperic IV Vancomycin then Bactrim.   Symptomatically, he has less "esophageal burning", nausea, and some vomiting. WBC is 4,4000 with an [place] of 3600. Hemoglobin 11.9.  Potassium is 3.8. Magnesium 1.5.  Plan: 1.  Labs today:  CBC with diff, BMP, Mg. 2.  Magnesium low today at 1.5. Will replace with 1gm IV today.  3.  Patient feeling good today. Nausea and vomiting controlled with prescribed interventions. Does not want IVFs today.    4.  Continue Carafate, PPI, and Decadron for esophageal irritation post radiation. 5.  RTC in 2 weeks for MD assessment, labs (CBC with , BMP, Mg), +/- IVF and Zofran.   Honor Loh, NP  01/21/2017, 2:25 PM   I saw and evaluated the patient, participating in the key portions of the service and reviewing pertinent diagnostic studies and records.  I reviewed the nurse practitioner's note and agree with the findings and the plan.  The assessment and plan were discussed with the patient.  Multiple questions were asked by the patient and answered.   Nolon Stalls, MD 01/21/2017, 2:25 PM

## 2017-01-23 ENCOUNTER — Other Ambulatory Visit: Payer: Self-pay | Admitting: *Deleted

## 2017-01-23 DIAGNOSIS — C7951 Secondary malignant neoplasm of bone: Secondary | ICD-10-CM

## 2017-01-23 DIAGNOSIS — C3490 Malignant neoplasm of unspecified part of unspecified bronchus or lung: Secondary | ICD-10-CM

## 2017-01-23 DIAGNOSIS — G893 Neoplasm related pain (acute) (chronic): Secondary | ICD-10-CM

## 2017-01-23 DIAGNOSIS — Z7189 Other specified counseling: Secondary | ICD-10-CM

## 2017-01-24 ENCOUNTER — Ambulatory Visit
Admission: RE | Admit: 2017-01-24 | Discharge: 2017-01-24 | Disposition: A | Discharge status: Home / Self Care | Payer: Medicaid Other | Source: Ambulatory Visit | Attending: Radiation Oncology | Admitting: Radiation Oncology

## 2017-01-24 DIAGNOSIS — Z510 Encounter for antineoplastic radiation therapy: Secondary | ICD-10-CM | POA: Diagnosis not present

## 2017-01-24 MED ORDER — OXYCODONE HCL 5 MG PO TABS: 5.0000 mg | ORAL_TABLET | ORAL | 0 refills | Status: DC | PRN

## 2017-01-25 ENCOUNTER — Telehealth: Payer: Self-pay | Admitting: *Deleted

## 2017-01-25 ENCOUNTER — Other Ambulatory Visit: Payer: Self-pay | Admitting: Urgent Care

## 2017-01-25 MED ORDER — NYSTATIN 100000 UNIT/ML MT SUSP: 5.0000 mL | Freq: Four times a day (QID) | OROMUCOSAL | 0 refills | Status: DC

## 2017-01-25 NOTE — Telephone Encounter (Signed)
He is on steroids, so I would anticipate him developing thrush. I sent in Nystatin 5cc swish and spit QID.

## 2017-01-25 NOTE — Telephone Encounter (Signed)
  Nystatin swish and spit 4 times a day.  M

## 2017-01-25 NOTE — Telephone Encounter (Signed)
Complaining of oral thrush and asking that something be called in for it

## 2017-01-25 NOTE — Telephone Encounter (Signed)
Patient notified of prescription sent in

## 2017-01-28 ENCOUNTER — Ambulatory Visit
Admission: RE | Admit: 2017-01-28 | Discharge: 2017-01-28 | Disposition: A | Discharge status: Home / Self Care | Payer: Medicaid Other | Source: Ambulatory Visit | Attending: Radiation Oncology | Admitting: Radiation Oncology

## 2017-01-28 DIAGNOSIS — Z510 Encounter for antineoplastic radiation therapy: Secondary | ICD-10-CM | POA: Diagnosis not present

## 2017-01-29 ENCOUNTER — Ambulatory Visit
Admission: RE | Admit: 2017-01-29 | Discharge: 2017-01-29 | Disposition: A | Discharge status: Home / Self Care | Payer: Medicaid Other | Source: Ambulatory Visit | Attending: Radiation Oncology | Admitting: Radiation Oncology

## 2017-01-29 ENCOUNTER — Other Ambulatory Visit: Payer: Self-pay | Admitting: *Deleted

## 2017-01-29 DIAGNOSIS — Z510 Encounter for antineoplastic radiation therapy: Secondary | ICD-10-CM | POA: Diagnosis not present

## 2017-01-29 MED ORDER — FLUCONAZOLE 200 MG PO TABS: 200.0000 mg | ORAL_TABLET | Freq: Every day | ORAL | 0 refills | Status: DC

## 2017-01-30 ENCOUNTER — Ambulatory Visit
Admission: RE | Admit: 2017-01-30 | Discharge: 2017-01-30 | Disposition: A | Discharge status: Home / Self Care | Payer: Medicaid Other | Source: Ambulatory Visit | Attending: Radiation Oncology | Admitting: Radiation Oncology

## 2017-01-30 DIAGNOSIS — Z510 Encounter for antineoplastic radiation therapy: Secondary | ICD-10-CM | POA: Diagnosis not present

## 2017-01-31 ENCOUNTER — Ambulatory Visit
Admission: RE | Admit: 2017-01-31 | Discharge: 2017-01-31 | Disposition: A | Discharge status: Home / Self Care | Payer: Medicaid Other | Source: Ambulatory Visit | Attending: Radiation Oncology | Admitting: Radiation Oncology

## 2017-01-31 ENCOUNTER — Other Ambulatory Visit: Payer: Self-pay | Admitting: *Deleted

## 2017-01-31 DIAGNOSIS — Z7189 Other specified counseling: Secondary | ICD-10-CM

## 2017-01-31 DIAGNOSIS — G893 Neoplasm related pain (acute) (chronic): Secondary | ICD-10-CM

## 2017-01-31 DIAGNOSIS — C3490 Malignant neoplasm of unspecified part of unspecified bronchus or lung: Secondary | ICD-10-CM

## 2017-01-31 DIAGNOSIS — Z510 Encounter for antineoplastic radiation therapy: Secondary | ICD-10-CM | POA: Diagnosis not present

## 2017-01-31 DIAGNOSIS — C7951 Secondary malignant neoplasm of bone: Secondary | ICD-10-CM

## 2017-01-31 MED ORDER — OXYCODONE HCL 5 MG PO TABS: 5.0000 mg | ORAL_TABLET | ORAL | 0 refills | Status: DC | PRN

## 2017-02-01 ENCOUNTER — Ambulatory Visit
Admission: RE | Admit: 2017-02-01 | Discharge: 2017-02-01 | Disposition: A | Discharge status: Home / Self Care | Payer: Medicaid Other | Source: Ambulatory Visit | Attending: Radiation Oncology | Admitting: Radiation Oncology

## 2017-02-01 DIAGNOSIS — Z510 Encounter for antineoplastic radiation therapy: Secondary | ICD-10-CM | POA: Diagnosis not present

## 2017-02-04 ENCOUNTER — Inpatient Hospital Stay: Payer: Medicaid Other

## 2017-02-04 ENCOUNTER — Ambulatory Visit
Admission: RE | Admit: 2017-02-04 | Discharge: 2017-02-04 | Disposition: A | Discharge status: Home / Self Care | Payer: Medicaid Other | Source: Ambulatory Visit | Attending: Radiation Oncology | Admitting: Radiation Oncology

## 2017-02-04 ENCOUNTER — Inpatient Hospital Stay (HOSPITAL_BASED_OUTPATIENT_CLINIC_OR_DEPARTMENT_OTHER): Payer: Medicaid Other | Admitting: Hematology and Oncology

## 2017-02-04 VITALS — BP 139/96 | HR 83 | Temp 97.2°F | Wt 142.5 lb

## 2017-02-04 DIAGNOSIS — D649 Anemia, unspecified: Secondary | ICD-10-CM

## 2017-02-04 DIAGNOSIS — E871 Hypo-osmolality and hyponatremia: Secondary | ICD-10-CM

## 2017-02-04 DIAGNOSIS — C7951 Secondary malignant neoplasm of bone: Secondary | ICD-10-CM

## 2017-02-04 DIAGNOSIS — Z7902 Long term (current) use of antithrombotics/antiplatelets: Secondary | ICD-10-CM

## 2017-02-04 DIAGNOSIS — M549 Dorsalgia, unspecified: Secondary | ICD-10-CM

## 2017-02-04 DIAGNOSIS — I119 Hypertensive heart disease without heart failure: Secondary | ICD-10-CM

## 2017-02-04 DIAGNOSIS — Z955 Presence of coronary angioplasty implant and graft: Secondary | ICD-10-CM

## 2017-02-04 DIAGNOSIS — Z7189 Other specified counseling: Secondary | ICD-10-CM

## 2017-02-04 DIAGNOSIS — Z951 Presence of aortocoronary bypass graft: Secondary | ICD-10-CM

## 2017-02-04 DIAGNOSIS — R112 Nausea with vomiting, unspecified: Secondary | ICD-10-CM | POA: Diagnosis not present

## 2017-02-04 DIAGNOSIS — Z923 Personal history of irradiation: Secondary | ICD-10-CM | POA: Diagnosis not present

## 2017-02-04 DIAGNOSIS — Z803 Family history of malignant neoplasm of breast: Secondary | ICD-10-CM

## 2017-02-04 DIAGNOSIS — Z8614 Personal history of Methicillin resistant Staphylococcus aureus infection: Secondary | ICD-10-CM

## 2017-02-04 DIAGNOSIS — E785 Hyperlipidemia, unspecified: Secondary | ICD-10-CM

## 2017-02-04 DIAGNOSIS — I2510 Atherosclerotic heart disease of native coronary artery without angina pectoris: Secondary | ICD-10-CM | POA: Diagnosis not present

## 2017-02-04 DIAGNOSIS — Z510 Encounter for antineoplastic radiation therapy: Secondary | ICD-10-CM | POA: Diagnosis not present

## 2017-02-04 DIAGNOSIS — C3412 Malignant neoplasm of upper lobe, left bronchus or lung: Secondary | ICD-10-CM

## 2017-02-04 DIAGNOSIS — Z86718 Personal history of other venous thrombosis and embolism: Secondary | ICD-10-CM

## 2017-02-04 DIAGNOSIS — Z800 Family history of malignant neoplasm of digestive organs: Secondary | ICD-10-CM

## 2017-02-04 DIAGNOSIS — Z8701 Personal history of pneumonia (recurrent): Secondary | ICD-10-CM

## 2017-02-04 DIAGNOSIS — Z85118 Personal history of other malignant neoplasm of bronchus and lung: Secondary | ICD-10-CM

## 2017-02-04 DIAGNOSIS — C3490 Malignant neoplasm of unspecified part of unspecified bronchus or lung: Secondary | ICD-10-CM

## 2017-02-04 DIAGNOSIS — K219 Gastro-esophageal reflux disease without esophagitis: Secondary | ICD-10-CM | POA: Diagnosis not present

## 2017-02-04 DIAGNOSIS — F17210 Nicotine dependence, cigarettes, uncomplicated: Secondary | ICD-10-CM

## 2017-02-04 DIAGNOSIS — R110 Nausea: Secondary | ICD-10-CM

## 2017-02-04 DIAGNOSIS — E876 Hypokalemia: Secondary | ICD-10-CM

## 2017-02-04 DIAGNOSIS — Z79899 Other long term (current) drug therapy: Secondary | ICD-10-CM

## 2017-02-04 DIAGNOSIS — Z801 Family history of malignant neoplasm of trachea, bronchus and lung: Secondary | ICD-10-CM

## 2017-02-04 DIAGNOSIS — I739 Peripheral vascular disease, unspecified: Secondary | ICD-10-CM

## 2017-02-04 LAB — CBC WITH DIFFERENTIAL/PLATELET
Basophils Absolute: 0.1 10*3/uL (ref 0–0.1)
Basophils Relative: 1 %
Eosinophils Absolute: 0 10*3/uL (ref 0–0.7)
Eosinophils Relative: 0 %
HCT: 37.7 % — ABNORMAL LOW (ref 40.0–52.0)
Hemoglobin: 13.3 g/dL (ref 13.0–18.0)
Lymphocytes Relative: 4 %
Lymphs Abs: 0.4 10*3/uL — ABNORMAL LOW (ref 1.0–3.6)
MCH: 35.5 pg — ABNORMAL HIGH (ref 26.0–34.0)
MCHC: 35.4 g/dL (ref 32.0–36.0)
MCV: 100.2 fL — ABNORMAL HIGH (ref 80.0–100.0)
Monocytes Absolute: 0.6 10*3/uL (ref 0.2–1.0)
Monocytes Relative: 6 %
Neutro Abs: 9.4 10*3/uL — ABNORMAL HIGH (ref 1.4–6.5)
Neutrophils Relative %: 89 %
Platelets: 170 10*3/uL (ref 150–440)
RBC: 3.76 MIL/uL — ABNORMAL LOW (ref 4.40–5.90)
RDW: 15.9 % — ABNORMAL HIGH (ref 11.5–14.5)
WBC: 10.5 10*3/uL (ref 3.8–10.6)

## 2017-02-04 LAB — BASIC METABOLIC PANEL
Anion gap: 7 (ref 5–15)
BUN: 21 mg/dL — ABNORMAL HIGH (ref 6–20)
CO2: 26 mmol/L (ref 22–32)
Calcium: 8.8 mg/dL — ABNORMAL LOW (ref 8.9–10.3)
Chloride: 94 mmol/L — ABNORMAL LOW (ref 101–111)
Creatinine, Ser: 0.72 mg/dL (ref 0.61–1.24)
GFR calc Af Amer: >60 mL/min (ref >60)
GFR calc non Af Amer: >60 mL/min (ref >60)
Glucose, Bld: 100 mg/dL — ABNORMAL HIGH (ref 65–99)
Potassium: 5.1 mmol/L (ref 3.5–5.1)
Sodium: 127 mmol/L — ABNORMAL LOW (ref 135–145)

## 2017-02-04 LAB — MAGNESIUM: Magnesium: 1.8 mg/dL (ref 1.7–2.4)

## 2017-02-04 MED ORDER — SODIUM CHLORIDE 0.9 % IV SOLN
Freq: Once | INTRAVENOUS | Status: AC
  Administered 2017-02-04: 15:00:00 via INTRAVENOUS
  Filled 2017-02-04: qty 1000

## 2017-02-04 MED ORDER — HEPARIN SOD (PORK) LOCK FLUSH 100 UNIT/ML IV SOLN
500.0000 [IU] | Freq: Once | INTRAVENOUS | Status: AC
  Administered 2017-02-04: 500 [IU] via INTRAVENOUS
  Filled 2017-02-04: qty 5

## 2017-02-04 NOTE — Progress Notes (Signed)
[facility] day:  02/04/2017   Chief Complaint: Philip [name] is a 56 y.o. male with extensive stage small cell lung cancer who is seen for 2 week assessment.  HPI:  The patient was last seen in the medical oncology clinic on 01/21/2017.  At that time, he was doing "better".  Nausea and vomiting had improved.  He was able to eat better. Patient had gained 4 pounds.  He had esophageal burning with some improvement with the liquid Carafate.  He did not feel that he needed IVF.  During the interim, is doing "ok". He notes that his esophageal burning has improved. Patient eating better and gaining weight appropriately. He is using supplement shakes. Patient reports daily nausea that is controlled with the prescribed antiemetics. Patient denies any increased shortness of breath. He has experienced no B symptoms or interval infections.   Patient started PCI on 01/28/2017; today is day 6 of 15 radiation treatments.    Past Medical History:  Diagnosis Date  . Anemia   . CAD (coronary artery disease)    a. 08/2014 Inf STEMI/CABG x 3 (LIMA->LAD, VG->Diag, RIMA->RCA).  . DVT, recurrent, lower extremity, acute, left ([place]) 2016  . Hyperlipidemia   . Hypertension   . Hypertensive heart disease   . Lung cancer ([place])   . PAD (peripheral artery disease) ([place])    a. 03/2015 Periph Angio: short occlusion of L pop w/ evidence of embolization into the DP->5.0x50 mm Inova self-expanding stent;  b. 04/08/2015 ABI: R 1.12, L 0.99.  . Tobacco abuse   . Toe amputation status, left [facility]) 07/09/2016   2017    Past Surgical History:  Procedure Laterality Date  . CARDIAC CATHETERIZATION N/A 08/24/2014   Procedure: Left Heart Cath and Coronary Angiography;  Surgeon: Isaias Cowman, MD;  Location: [facility];  Service: Cardiovascular;  Laterality: N/A;  . CARDIAC CATHETERIZATION N/A 08/24/2014   Procedure: Coronary Stent Intervention;  Surgeon:  Isaias Cowman, MD;  Location: [facility];  Service: Cardiovascular;  Laterality: N/A;  . CORONARY ARTERY BYPASS GRAFT N/A 08/25/2014   Procedure: CORONARY ARTERY BYPASS GRAFTING (CABG), ON PUMP, TIMES THREE, USING BILATERAL MAMMARY ARTERIES, RIGHT GREATER SAPHENOUS VEIN HARVESTED ENDOSCOPICALLY;  Surgeon: Melrose Nakayama, MD;  Location: [facility];  Service: Open Heart Surgery;  Laterality: N/A;  . INCISION AND DRAINAGE ABSCESS Left 11/15/2016   Procedure: INCISION AND DRAINAGE ABSCESS OF LEFT WRIST;  Surgeon: Roseanne Kaufman, MD;  Location: [facility];  Service: Orthopedics;  Laterality: Left;  . PERIPHERAL VASCULAR CATHETERIZATION N/A 03/30/2015   Procedure: Abdominal Aortogram w/Lower Extremity;  Surgeon: Wellington Hampshire, MD;  Location: [facility];  Service: Cardiovascular;  Laterality: N/A;  . PORTA CATH INSERTION N/A 07/27/2016   Procedure: Glori Luis Cath Insertion;  Surgeon: Algernon Huxley, MD;  Location: [facility];  Service: Cardiovascular;  Laterality: N/A;  . TEE WITHOUT CARDIOVERSION N/A 08/25/2014   Procedure: TRANSESOPHAGEAL ECHOCARDIOGRAM (TEE);  Surgeon: Melrose Nakayama, MD;  Location: [facility];  Service: Open Heart Surgery;  Laterality: N/A;    Family History  Problem Relation Age of Onset  . Hypertension Father   . Lung cancer Father   . Heart attack Mother 77       STENT  . Hyperlipidemia Mother   . Heart attack Brother   . Heart attack Brother   . Breast cancer Sister   . Heart attack Sister   . Colon cancer Paternal  Grandfather     Social History:  reports that he has been smoking Cigarettes.  He has been smoking about 0.50 packs per day. He has never used smokeless tobacco. He reports that he drinks alcohol. He reports that he does not use drugs.  The patient smokes 1/2 pack of cigarettes/day (previously 2 1/2 ppd).  He started smoking in his teens.  He denies any exposure to radiation or toxins.  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  He lives in [place].  His wife is named Publishing copy.  He is accompanied by his wife today.  Allergies:  Allergies  Allergen Reactions  . Amitriptyline Other (See Comments)    Burning sensation all over  . Contrast Media [Iodinated Diagnostic Agents] Other (See Comments)    Pt states that he got "knots behind his ears"  . Nsaids Other (See Comments)    Blood in stools  . Ibuprofen Other (See Comments) and Nausea Only    Blood in stools  . Tape Rash and Other (See Comments)    PLEASE USE COBAN WRAP; THE PATIENT'S SKIN TEARS WHEN BANDAGES ARE REMOVED!!    Current Medications: Current Outpatient Prescriptions  Medication Sig Dispense Refill  . atorvastatin (LIPITOR) 40 MG tablet TAKE 1 TABLET BY MOUTH DAILY (Patient taking differently: Take 40 mg by mouth once a day) 90 tablet 3  . buPROPion (WELLBUTRIN XL) 150 MG 24 hr tablet TK 1 T PO QD  5  . Calcium Carb-Cholecalciferol (CALCIUM + D3 PO) Take 1 tablet by mouth 3 (three) times daily.    . clopidogrel (PLAVIX) 75 MG tablet Take 1 tablet (75 mg total) by mouth daily. 90 tablet 3  . dexamethasone (DECADRON) 4 MG tablet Take 1 tablet (4 mg total) by mouth daily. 35 tablet 0  . diphenhydrAMINE (BENADRYL) 25 mg capsule Take 25 mg by mouth 2 (two) times daily as needed for allergies.     Marland Kitchen FLOVENT HFA 220 MCG/ACT inhaler INL 2 PFS PO BID  5  . lidocaine-prilocaine (EMLA) cream Apply to affected area once (Patient taking differently: Apply 1 application topically See admin instructions. TO AFFECTED AREA AS DIRECTED) 30 g 3  . lisinopril (PRINIVIL,ZESTRIL) 5 MG tablet Take 1 tablet (5 mg total) by mouth daily. 90 tablet 1  . metoprolol tartrate (LOPRESSOR) 25 MG tablet TAKE 1 TABLET BY MOUTH TWICE DAILY 180 tablet 3  . nystatin (MYCOSTATIN) 100000 UNIT/ML suspension Take 5 mLs (500,000 Units total) by mouth 4 (four) times daily. Swish and spit. 60 mL 0  . ondansetron (ZOFRAN) 8 MG tablet Take 1 tablet (8 mg total) by mouth every 8 (eight)  hours as needed for nausea or vomiting. 30 tablet 0  . oxyCODONE (OXY IR/ROXICODONE) 5 MG immediate release tablet Take 1 tablet (5 mg total) by mouth every 4 (four) hours as needed for severe pain. 40 tablet 0  . oxyCODONE (OXYCONTIN) 10 mg 12 hr tablet Take 1 tablet (10 mg total) by mouth every 12 (twelve) hours. 40 tablet 0  . pantoprazole (PROTONIX) 40 MG tablet TK 1 T PO QD  5  . PROAIR HFA 108 (90 Base) MCG/ACT inhaler INL 2 PFS PO Q 4 TO 6 H PRN  5  . promethazine (PHENERGAN) 25 MG suppository Place 1 suppository (25 mg total) rectally every 6 (six) hours as needed for nausea or vomiting. 12 each 5  . sucralfate (CARAFATE) 1 g tablet Take 1 tablet (1 g total) by mouth 3 (  three) times daily. Dissolve in 2-3 tbsp warm water, swish and swallow 90 tablet 3   No current facility-administered medications for this visit.    Facility-Administered Medications Ordered in Other Visits  Medication Dose Route Frequency Provider Last Rate Last Dose  . heparin lock flush 100 unit/mL  500 Units Intravenous Once Lequita Asal, MD        Review of Systems:  GENERAL:  Feels "ok".  No fevers or sweats.  Weight up 2 pounds.  PERFORMANCE STATUS (ECOG):  1 HEENT:  No visual changes, runny nose, sore throat, mouth sores or tenderness. Lungs: No shortness of breath or cough.  No hemoptysis. Cardiac:  No chest pain, palpitations, orthopnea, or PND. GI:  Eating.  Esophageal burning, improved.  Nausea managed with Phenergan and Zofran.  No vomiting, diarrhea, constipation, melena or hematochezia.   GU:  No urgency, frequency, dysuria, or hematuria. Musculoskeletal:  Back pain (see HPI).  No joint pain.  No muscle tenderness. Extremities:  No pain or swelling. Left toe removed (old). Discoloration of toes (see HPI). Skin:  Left hand abscess, resolved.  No rashes or skin changes. Neuro:  No headache, numbness or weakness, balance or coordination issues. Endocrine:  No diabetes, thyroid issues, hot flashes  or night sweats. Psych:  No mood changes, depression or anxiety. Pain:  Back pain 7 out of 10. Review of systems:  All other systems reviewed and found to be negative.  Physical Exam: Blood pressure (!) 153/88, pulse 73, temperature (!) 97.2 F (36.2 C), temperature source Tympanic, weight 142 lb 8 oz (64.6 kg). GENERAL:  Well developed, well nourished, gentleman sitting comfortably in the exam room in no acute distress. MENTAL STATUS: Alert and oriented to person, place and time. HEAD:Alopecia.  Normocephalic, atraumatic, face symmetric, no Cushingoid features. EYES:[place]. No scleral hemorrhage.  Pupils equal round and reactive to light and accomodation. No conjunctivitis or scleral icterus. XMI:WOEHOZYYQM clear without lesion. Tonguenormal. Poor dentition. Mucous membranes moist. RESPIRATORY:Clear to auscultationwithout rales, wheezes or rhonchi. CARDIOVASCULAR:Regular rate andrhythmwithout murmur, rub or gallop. ABDOMEN:Soft, non-tender, with active bowel sounds, and no hepatosplenomegaly. No masses. SKIN: Left hand well healed.  No evidence of infection. EXTREMITIES: No lower extremity edema, no skin discoloration or tenderness. No palpable cords. LYMPHNODES: No palpable cervical, supraclavicular, axillary or inguinal adenopathy  NEUROLOGICAL: Unremarkable. PSYCH: Appropriate.   Appointment on 02/04/2017  Component Date Value Ref Range Status  . WBC 02/04/2017 10.5  3.8 - 10.6 K/uL Final  . RBC 02/04/2017 3.76* 4.40 - 5.90 MIL/uL Final  . Hemoglobin 02/04/2017 13.3  13.0 - 18.0 g/dL Final  . HCT 02/04/2017 37.7* 40.0 - 52.0 % Final  . MCV 02/04/2017 100.2* 80.0 - 100.0 fL Final  . MCH 02/04/2017 35.5* 26.0 - 34.0 pg Final  . MCHC 02/04/2017 35.4  32.0 - 36.0 g/dL Final  . RDW 02/04/2017 15.9* 11.5 - 14.5 % Final  . Platelets 02/04/2017 170  150 - 440 K/uL Final  . Neutrophils Relative % 02/04/2017 89  % Final  . Neutro Abs 02/04/2017 9.4* 1.4 -  6.5 K/uL Final  . Lymphocytes Relative 02/04/2017 4  % Final  . Lymphs Abs 02/04/2017 0.4* 1.0 - 3.6 K/uL Final  . Monocytes Relative 02/04/2017 6  % Final  . Monocytes Absolute 02/04/2017 0.6  0.2 - 1.0 K/uL Final  . Eosinophils Relative 02/04/2017 0  % Final  . Eosinophils Absolute 02/04/2017 0.0  0 - 0.7 K/uL Final  . Basophils Relative 02/04/2017 1  % Final  .  Basophils Absolute 02/04/2017 0.1  0 - 0.1 K/uL Final  . Sodium 02/04/2017 127* 135 - 145 mmol/L Final  . Potassium 02/04/2017 5.1  3.5 - 5.1 mmol/L Final  . Chloride 02/04/2017 94* 101 - 111 mmol/L Final  . CO2 02/04/2017 26  22 - 32 mmol/L Final  . Glucose, Bld 02/04/2017 100* 65 - 99 mg/dL Final  . BUN 02/04/2017 21* 6 - 20 mg/dL Final  . Creatinine, Ser 02/04/2017 0.72  0.61 - 1.24 mg/dL Final  . Calcium 02/04/2017 8.8* 8.9 - 10.3 mg/dL Final  . GFR calc non Af Amer 02/04/2017 >60  >60 mL/min Final  . GFR calc Af Amer 02/04/2017 >60  >60 mL/min Final   Comment: (NOTE) The eGFR has been calculated using the CKD EPI equation. This calculation has not been validated in all clinical situations. eGFR's persistently <60 mL/min signify possible Chronic Kidney Disease.   . Anion gap 02/04/2017 7  5 - 15 Final  . Magnesium 02/04/2017 1.8  1.7 - 2.4 mg/dL Final    Assessment:  Philip [name] is a 56 y.o. male with extensive stage small cell lung cancer.  He presented with a 2 year history of night sweats and a 36 pound weight loss in 6 months.  Right supraclavicular biopsy on 07/19/2016 revealed small cell carcinoma of the lung.  CEA was 1.7 on 07/31/2016.  Chest CT on 06/28/2016 revealed extensive adenopathy.  There were multiple lymph nodes descending thoracic aorta, largest measuring 2.1 x 1.7 cm.  There was adenopathy in the aortopulmonary window with the largest confluence of lymph nodes measuring 4 x 2.6 cm. There was hilar adenopathy on the left measuring 2 x 1.9 cm. There were multiple enlarged lymph nodes in the  pretracheal region, largest 1.7 x 1.7 cm.  There were lymph nodes to the left of the carina measuring 2.8 x 1.9 cm. There was a subcarinal lymph node measuring 2.4 x 1.6 cm. There was a 4 x 3 mm nodular opacity in the anterior segment of the left upper lobe.    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  There was nodal metastasis within the chest and supraclavicular/low jugular regions.  There was multifocal osseous metastasis.  There was possibly a pathologic fracture involving the posteromedial right tenth rib.  There was new left lower lobe opacity with mild hypermetabolism, suspicious for infection or postobstructive pneumonitis.  He completed a course of Levaquin for apost-obstructive pneumonia.  He experienced transient positional left facial burning.  Head MRI on 07/26/2016 revealed no evidence of brain metastasis. There was punctate DWI hyperintensity along a high right frontal gyrus, possible recent infarct.  There was a small occipital bone metastasis.  Head MRA on 07/27/2016 revealed normal large and medium sized vessels.  Bilateral carotid duplex on 07/27/2016 revealed <50% stenosis in the right and left internal carotis arteries.  Echo on 07/27/2016 revealed an EF of 45-50% without cardiac source of emboli.  He received 4 cycles of carboplatin and etoposide (07/31/2016 - 10/02/2016) with OnPro Neulasta support.  Platelet nadir was 40,000 on day 16 of cycle #2 and 23,000 on day 15 of cycle #3.  He received 2 units of PRBC with cycle #3 and 1 unit with cycle #4.  He received Xgeva on 08/13/2016 (last 10/30/2016).  Chest CT on 09/11/2016 revealed a marked response to therapy.  There was near complete resolution of left suprahilar mass and thoracic adenopathy.  There was interval sclerosis indicative of healing site  of osseous metastasis.  Thoracic spine MRI on 10/22/2016 reveals sclerotic enhancing lesion at T7 vertebral body on the left  compatible with metastasis. There was a healing fracture the right medial 10th rib.  PET scan on 11/12/2016 revealed a new 10 mm hypermetabolic left axillary lymph node (SUV 3.6).  There were no other sites of metabolically active disease.  He completed a course of thoracic spine radiation (3000 cGy in 10 fractions) on 01/11/2017.  He is scheduled to begin PCI (3000 cGy in 15 fractions) on 01/28/2017.  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  Anemia work-up on 05/29/.2018 revealed the following normal labs:  Ferritin (219), iron sat (28%), TIBC (227), B12 (3055), and folate (6.8).  He has a history of left lower extremity DVT a few months after his cardiac surgery in 08/2014.  He has peripheral vascular disease s/p stent placement in 03/2015.  He is on a Plavix.  He has not had a colonoscopy in 30 years.  He was ineligible for the AbbVie clinical trial ([id number]) secondary to his left hand abscess.  He underwent surgical debridement of a left hand abscess on 11/15/2016.  Wound cultures grew MRSA.  He was treated with emperic IV Vancomycin then Bactrim.   Symptomatically, he is doing well. Previously reported esophageal burning has improved. Patient has continued nausea that is controlled with the prescribed antiemetics.  WBC is 10,500 with an [place] of 9400. Hemoglobin is 13.3.  Potassium is 5.1. Sodium is low at 127.  Plan: 1.  Labs today:  CBC with diff, BMP, Mg. 2.  NS 1000cc today, then daily x 2 days.  3.  Continue to follow up with radiation oncology for scheduled PCI treatments.  4.  Continue Carafate, PPI, and Decadron for esophageal irritation post radiation. 5.  RTC on 02/06/2017 for labs (BMP). 6.  RTC in 2 weeks for MD assessment, labs (CBC with , BMP, Mg), +/- IVF and Zofran.   Honor Loh, NP  02/04/2017, 2:18 PM   I saw and evaluated the patient, participating in the key portions of the service and reviewing pertinent diagnostic studies and records.  I reviewed the nurse  practitioner's note and agree with the findings and the plan.  The assessment and plan were discussed with the patient.  A few questions were asked by the patient and answered.   Nolon Stalls, MD 02/04/2017, 2:18 PM

## 2017-02-04 NOTE — Progress Notes (Unsigned)
Patient to receive one hour of fluid today per Aaron Edelman, NP

## 2017-02-04 NOTE — Progress Notes (Signed)
Patient here today for follow up.  Patient states no new concerns today.  Orthostantic reading on right arm:  Laying 148/90 pulse 75; Sitting 147/95 pulse 80; Standing 139/96 pulse 83

## 2017-02-05 ENCOUNTER — Other Ambulatory Visit: Payer: Self-pay | Admitting: Hematology and Oncology

## 2017-02-05 ENCOUNTER — Inpatient Hospital Stay: Payer: Medicaid Other

## 2017-02-05 ENCOUNTER — Other Ambulatory Visit: Payer: Self-pay | Admitting: *Deleted

## 2017-02-05 ENCOUNTER — Ambulatory Visit
Admission: RE | Admit: 2017-02-05 | Discharge: 2017-02-05 | Disposition: A | Discharge status: Home / Self Care | Payer: Medicaid Other | Source: Ambulatory Visit | Attending: Radiation Oncology | Admitting: Radiation Oncology

## 2017-02-05 VITALS — BP 158/95 | HR 91 | Temp 98.0°F | Resp 16

## 2017-02-05 DIAGNOSIS — C7951 Secondary malignant neoplasm of bone: Secondary | ICD-10-CM

## 2017-02-05 DIAGNOSIS — Z510 Encounter for antineoplastic radiation therapy: Secondary | ICD-10-CM | POA: Diagnosis not present

## 2017-02-05 DIAGNOSIS — G893 Neoplasm related pain (acute) (chronic): Secondary | ICD-10-CM

## 2017-02-05 DIAGNOSIS — C3412 Malignant neoplasm of upper lobe, left bronchus or lung: Secondary | ICD-10-CM | POA: Diagnosis not present

## 2017-02-05 DIAGNOSIS — C3490 Malignant neoplasm of unspecified part of unspecified bronchus or lung: Secondary | ICD-10-CM

## 2017-02-05 DIAGNOSIS — Z7189 Other specified counseling: Secondary | ICD-10-CM

## 2017-02-05 MED ORDER — OXYCODONE HCL ER 10 MG PO T12A: 10.0000 mg | EXTENDED_RELEASE_TABLET | Freq: Two times a day (BID) | ORAL | 0 refills | Status: DC

## 2017-02-05 MED ORDER — SODIUM CHLORIDE 0.9% FLUSH
10.0000 mL | INTRAVENOUS | Status: DC | PRN
  Administered 2017-02-05: 10 mL
  Filled 2017-02-05: qty 10

## 2017-02-05 MED ORDER — HEPARIN SOD (PORK) LOCK FLUSH 100 UNIT/ML IV SOLN
500.0000 [IU] | Freq: Once | INTRAVENOUS | Status: AC | PRN
Start: 2017-02-05 — End: 2017-02-05
  Administered 2017-02-05: 500 [IU]

## 2017-02-05 MED ORDER — SODIUM CHLORIDE 0.9 % IV SOLN
Freq: Once | INTRAVENOUS | Status: AC
  Administered 2017-02-05: 14:00:00 via INTRAVENOUS
  Filled 2017-02-05: qty 1000

## 2017-02-05 NOTE — Progress Notes (Signed)
OK to infuse IVF over 1 hr per Doran Durand., NP.

## 2017-02-06 ENCOUNTER — Inpatient Hospital Stay: Payer: Medicaid Other

## 2017-02-06 ENCOUNTER — Ambulatory Visit
Admission: RE | Admit: 2017-02-06 | Discharge: 2017-02-06 | Disposition: A | Discharge status: Home / Self Care | Payer: Medicaid Other | Source: Ambulatory Visit | Attending: Radiation Oncology | Admitting: Radiation Oncology

## 2017-02-06 VITALS — BP 146/95 | HR 77 | Temp 97.6°F | Resp 16

## 2017-02-06 DIAGNOSIS — Z7189 Other specified counseling: Secondary | ICD-10-CM

## 2017-02-06 DIAGNOSIS — C3490 Malignant neoplasm of unspecified part of unspecified bronchus or lung: Secondary | ICD-10-CM

## 2017-02-06 DIAGNOSIS — C3412 Malignant neoplasm of upper lobe, left bronchus or lung: Secondary | ICD-10-CM | POA: Diagnosis not present

## 2017-02-06 DIAGNOSIS — C7951 Secondary malignant neoplasm of bone: Secondary | ICD-10-CM

## 2017-02-06 DIAGNOSIS — Z510 Encounter for antineoplastic radiation therapy: Secondary | ICD-10-CM | POA: Diagnosis not present

## 2017-02-06 LAB — BASIC METABOLIC PANEL
Anion gap: 8 (ref 5–15)
BUN: 19 mg/dL (ref 6–20)
CO2: 22 mmol/L (ref 22–32)
Calcium: 8.3 mg/dL — ABNORMAL LOW (ref 8.9–10.3)
Chloride: 99 mmol/L — ABNORMAL LOW (ref 101–111)
Creatinine, Ser: 0.7 mg/dL (ref 0.61–1.24)
GFR calc Af Amer: >60 mL/min (ref >60)
GFR calc non Af Amer: >60 mL/min (ref >60)
Glucose, Bld: 115 mg/dL — ABNORMAL HIGH (ref 65–99)
Potassium: 4.7 mmol/L (ref 3.5–5.1)
Sodium: 129 mmol/L — ABNORMAL LOW (ref 135–145)

## 2017-02-06 MED ORDER — SODIUM CHLORIDE 0.9% FLUSH
10.0000 mL | INTRAVENOUS | Status: DC | PRN
  Administered 2017-02-06: 10 mL
  Filled 2017-02-06: qty 10

## 2017-02-06 MED ORDER — HEPARIN SOD (PORK) LOCK FLUSH 100 UNIT/ML IV SOLN
500.0000 [IU] | Freq: Once | INTRAVENOUS | Status: AC | PRN
  Administered 2017-02-06: 500 [IU]
  Filled 2017-02-06: qty 5

## 2017-02-06 MED ORDER — SODIUM CHLORIDE 0.9 % IV SOLN
Freq: Once | INTRAVENOUS | Status: AC
  Administered 2017-02-06: 14:00:00 via INTRAVENOUS
  Filled 2017-02-06: qty 1000

## 2017-02-07 ENCOUNTER — Other Ambulatory Visit: Payer: Self-pay | Admitting: *Deleted

## 2017-02-07 ENCOUNTER — Ambulatory Visit
Admission: RE | Admit: 2017-02-07 | Discharge: 2017-02-07 | Disposition: A | Discharge status: Home / Self Care | Payer: Medicaid Other | Source: Ambulatory Visit | Attending: Radiation Oncology | Admitting: Radiation Oncology

## 2017-02-07 DIAGNOSIS — Z7189 Other specified counseling: Secondary | ICD-10-CM

## 2017-02-07 DIAGNOSIS — C3490 Malignant neoplasm of unspecified part of unspecified bronchus or lung: Secondary | ICD-10-CM

## 2017-02-07 DIAGNOSIS — C7951 Secondary malignant neoplasm of bone: Secondary | ICD-10-CM

## 2017-02-07 DIAGNOSIS — Z510 Encounter for antineoplastic radiation therapy: Secondary | ICD-10-CM | POA: Diagnosis not present

## 2017-02-07 DIAGNOSIS — G893 Neoplasm related pain (acute) (chronic): Secondary | ICD-10-CM

## 2017-02-07 MED ORDER — OXYCODONE HCL 5 MG PO TABS: 5.0000 mg | ORAL_TABLET | ORAL | 0 refills | Status: DC | PRN

## 2017-02-08 ENCOUNTER — Other Ambulatory Visit: Payer: Self-pay | Admitting: *Deleted

## 2017-02-08 ENCOUNTER — Ambulatory Visit
Admission: RE | Admit: 2017-02-08 | Discharge: 2017-02-08 | Disposition: A | Discharge status: Home / Self Care | Payer: Medicaid Other | Source: Ambulatory Visit | Attending: Radiation Oncology | Admitting: Radiation Oncology

## 2017-02-08 DIAGNOSIS — Z510 Encounter for antineoplastic radiation therapy: Secondary | ICD-10-CM | POA: Diagnosis not present

## 2017-02-08 MED ORDER — DEXAMETHASONE 4 MG PO TABS: 4.0000 mg | ORAL_TABLET | Freq: Two times a day (BID) | ORAL | 0 refills | Status: DC

## 2017-02-11 ENCOUNTER — Ambulatory Visit
Admission: RE | Admit: 2017-02-11 | Discharge: 2017-02-11 | Disposition: A | Discharge status: Home / Self Care | Payer: Medicaid Other | Source: Ambulatory Visit | Attending: Radiation Oncology | Admitting: Radiation Oncology

## 2017-02-11 DIAGNOSIS — Z510 Encounter for antineoplastic radiation therapy: Secondary | ICD-10-CM | POA: Diagnosis not present

## 2017-02-12 ENCOUNTER — Ambulatory Visit
Admission: RE | Admit: 2017-02-12 | Discharge: 2017-02-12 | Disposition: A | Discharge status: Home / Self Care | Payer: Medicaid Other | Source: Ambulatory Visit | Attending: Radiation Oncology | Admitting: Radiation Oncology

## 2017-02-12 DIAGNOSIS — Z510 Encounter for antineoplastic radiation therapy: Secondary | ICD-10-CM | POA: Diagnosis not present

## 2017-02-13 ENCOUNTER — Ambulatory Visit
Admission: RE | Admit: 2017-02-13 | Discharge: 2017-02-13 | Disposition: A | Discharge status: Home / Self Care | Payer: Medicaid Other | Source: Ambulatory Visit | Attending: Radiation Oncology | Admitting: Radiation Oncology

## 2017-02-13 ENCOUNTER — Other Ambulatory Visit: Payer: Self-pay | Admitting: *Deleted

## 2017-02-13 DIAGNOSIS — G893 Neoplasm related pain (acute) (chronic): Secondary | ICD-10-CM

## 2017-02-13 DIAGNOSIS — C3490 Malignant neoplasm of unspecified part of unspecified bronchus or lung: Secondary | ICD-10-CM

## 2017-02-13 DIAGNOSIS — C7951 Secondary malignant neoplasm of bone: Secondary | ICD-10-CM

## 2017-02-13 DIAGNOSIS — Z510 Encounter for antineoplastic radiation therapy: Secondary | ICD-10-CM | POA: Diagnosis not present

## 2017-02-13 DIAGNOSIS — Z7189 Other specified counseling: Secondary | ICD-10-CM

## 2017-02-14 ENCOUNTER — Ambulatory Visit: Payer: Medicaid Other | Admitting: Radiation Oncology

## 2017-02-14 ENCOUNTER — Ambulatory Visit
Admission: RE | Admit: 2017-02-14 | Discharge: 2017-02-14 | Disposition: A | Discharge status: Home / Self Care | Payer: Medicaid Other | Source: Ambulatory Visit | Attending: Radiation Oncology | Admitting: Radiation Oncology

## 2017-02-14 DIAGNOSIS — Z510 Encounter for antineoplastic radiation therapy: Secondary | ICD-10-CM | POA: Diagnosis not present

## 2017-02-14 MED ORDER — OXYCODONE HCL 5 MG PO TABS: 5.0000 mg | ORAL_TABLET | ORAL | 0 refills | Status: DC | PRN

## 2017-02-15 ENCOUNTER — Ambulatory Visit
Admission: RE | Admit: 2017-02-15 | Discharge: 2017-02-15 | Disposition: A | Discharge status: Home / Self Care | Payer: Medicaid Other | Source: Ambulatory Visit | Attending: Radiation Oncology | Admitting: Radiation Oncology

## 2017-02-15 DIAGNOSIS — Z510 Encounter for antineoplastic radiation therapy: Secondary | ICD-10-CM | POA: Diagnosis not present

## 2017-02-16 ENCOUNTER — Encounter: Payer: Self-pay | Admitting: Hematology and Oncology

## 2017-02-17 ENCOUNTER — Encounter: Payer: Self-pay | Admitting: Hematology and Oncology

## 2017-02-18 ENCOUNTER — Inpatient Hospital Stay: Payer: Medicaid Other | Attending: Hematology and Oncology

## 2017-02-18 ENCOUNTER — Inpatient Hospital Stay (HOSPITAL_BASED_OUTPATIENT_CLINIC_OR_DEPARTMENT_OTHER): Payer: Medicaid Other | Admitting: Hematology and Oncology

## 2017-02-18 ENCOUNTER — Other Ambulatory Visit: Payer: Self-pay | Admitting: *Deleted

## 2017-02-18 ENCOUNTER — Inpatient Hospital Stay: Payer: Medicaid Other

## 2017-02-18 VITALS — BP 158/101 | HR 91 | Temp 96.6°F | Resp 18 | Wt 144.1 lb

## 2017-02-18 DIAGNOSIS — I739 Peripheral vascular disease, unspecified: Secondary | ICD-10-CM | POA: Insufficient documentation

## 2017-02-18 DIAGNOSIS — F17210 Nicotine dependence, cigarettes, uncomplicated: Secondary | ICD-10-CM | POA: Insufficient documentation

## 2017-02-18 DIAGNOSIS — C3412 Malignant neoplasm of upper lobe, left bronchus or lung: Secondary | ICD-10-CM | POA: Diagnosis not present

## 2017-02-18 DIAGNOSIS — Z801 Family history of malignant neoplasm of trachea, bronchus and lung: Secondary | ICD-10-CM

## 2017-02-18 DIAGNOSIS — K0889 Other specified disorders of teeth and supporting structures: Secondary | ICD-10-CM | POA: Diagnosis not present

## 2017-02-18 DIAGNOSIS — G8929 Other chronic pain: Secondary | ICD-10-CM | POA: Insufficient documentation

## 2017-02-18 DIAGNOSIS — R531 Weakness: Secondary | ICD-10-CM

## 2017-02-18 DIAGNOSIS — D649 Anemia, unspecified: Secondary | ICD-10-CM

## 2017-02-18 DIAGNOSIS — Z800 Family history of malignant neoplasm of digestive organs: Secondary | ICD-10-CM | POA: Insufficient documentation

## 2017-02-18 DIAGNOSIS — Z7902 Long term (current) use of antithrombotics/antiplatelets: Secondary | ICD-10-CM | POA: Insufficient documentation

## 2017-02-18 DIAGNOSIS — Z8614 Personal history of Methicillin resistant Staphylococcus aureus infection: Secondary | ICD-10-CM

## 2017-02-18 DIAGNOSIS — I119 Hypertensive heart disease without heart failure: Secondary | ICD-10-CM | POA: Insufficient documentation

## 2017-02-18 DIAGNOSIS — C7951 Secondary malignant neoplasm of bone: Secondary | ICD-10-CM

## 2017-02-18 DIAGNOSIS — Z7952 Long term (current) use of systemic steroids: Secondary | ICD-10-CM

## 2017-02-18 DIAGNOSIS — E785 Hyperlipidemia, unspecified: Secondary | ICD-10-CM | POA: Diagnosis not present

## 2017-02-18 DIAGNOSIS — R110 Nausea: Secondary | ICD-10-CM | POA: Insufficient documentation

## 2017-02-18 DIAGNOSIS — Z85118 Personal history of other malignant neoplasm of bronchus and lung: Secondary | ICD-10-CM

## 2017-02-18 DIAGNOSIS — R630 Anorexia: Secondary | ICD-10-CM

## 2017-02-18 DIAGNOSIS — R432 Parageusia: Secondary | ICD-10-CM | POA: Diagnosis not present

## 2017-02-18 DIAGNOSIS — M545 Low back pain: Secondary | ICD-10-CM | POA: Diagnosis not present

## 2017-02-18 DIAGNOSIS — R5383 Other fatigue: Secondary | ICD-10-CM

## 2017-02-18 DIAGNOSIS — E871 Hypo-osmolality and hyponatremia: Secondary | ICD-10-CM | POA: Diagnosis not present

## 2017-02-18 DIAGNOSIS — Z8701 Personal history of pneumonia (recurrent): Secondary | ICD-10-CM

## 2017-02-18 DIAGNOSIS — Z79899 Other long term (current) drug therapy: Secondary | ICD-10-CM

## 2017-02-18 DIAGNOSIS — Z86718 Personal history of other venous thrombosis and embolism: Secondary | ICD-10-CM | POA: Insufficient documentation

## 2017-02-18 DIAGNOSIS — Z812 Family history of tobacco abuse and dependence: Secondary | ICD-10-CM | POA: Diagnosis not present

## 2017-02-18 DIAGNOSIS — I2510 Atherosclerotic heart disease of native coronary artery without angina pectoris: Secondary | ICD-10-CM | POA: Insufficient documentation

## 2017-02-18 DIAGNOSIS — Z803 Family history of malignant neoplasm of breast: Secondary | ICD-10-CM | POA: Insufficient documentation

## 2017-02-18 DIAGNOSIS — Z923 Personal history of irradiation: Secondary | ICD-10-CM | POA: Diagnosis not present

## 2017-02-18 DIAGNOSIS — Z7189 Other specified counseling: Secondary | ICD-10-CM

## 2017-02-18 DIAGNOSIS — C3490 Malignant neoplasm of unspecified part of unspecified bronchus or lung: Secondary | ICD-10-CM

## 2017-02-18 DIAGNOSIS — I252 Old myocardial infarction: Secondary | ICD-10-CM | POA: Diagnosis not present

## 2017-02-18 DIAGNOSIS — Z951 Presence of aortocoronary bypass graft: Secondary | ICD-10-CM | POA: Insufficient documentation

## 2017-02-18 LAB — CBC WITH DIFFERENTIAL/PLATELET
Basophils Absolute: 0 10*3/uL (ref 0–0.1)
Basophils Relative: 0 %
Eosinophils Absolute: 0 10*3/uL (ref 0–0.7)
Eosinophils Relative: 0 %
HCT: 36.6 % — ABNORMAL LOW (ref 40.0–52.0)
Hemoglobin: 13.1 g/dL (ref 13.0–18.0)
Lymphocytes Relative: 6 %
Lymphs Abs: 0.4 10*3/uL — ABNORMAL LOW (ref 1.0–3.6)
MCH: 35.8 pg — ABNORMAL HIGH (ref 26.0–34.0)
MCHC: 35.6 g/dL (ref 32.0–36.0)
MCV: 100.5 fL — ABNORMAL HIGH (ref 80.0–100.0)
Monocytes Absolute: 0.3 10*3/uL (ref 0.2–1.0)
Monocytes Relative: 4 %
Neutro Abs: 6.4 10*3/uL (ref 1.4–6.5)
Neutrophils Relative %: 90 %
Platelets: 134 10*3/uL — ABNORMAL LOW (ref 150–440)
RBC: 3.65 MIL/uL — ABNORMAL LOW (ref 4.40–5.90)
RDW: 15.3 % — ABNORMAL HIGH (ref 11.5–14.5)
WBC: 7.1 10*3/uL (ref 3.8–10.6)

## 2017-02-18 LAB — BASIC METABOLIC PANEL
Anion gap: 10 (ref 5–15)
BUN: 13 mg/dL (ref 6–20)
CO2: 22 mmol/L (ref 22–32)
Calcium: 8.4 mg/dL — ABNORMAL LOW (ref 8.9–10.3)
Chloride: 93 mmol/L — ABNORMAL LOW (ref 101–111)
Creatinine, Ser: 0.75 mg/dL (ref 0.61–1.24)
GFR calc Af Amer: >60 mL/min (ref >60)
GFR calc non Af Amer: >60 mL/min (ref >60)
Glucose, Bld: 156 mg/dL — ABNORMAL HIGH (ref 65–99)
Potassium: 3.7 mmol/L (ref 3.5–5.1)
Sodium: 125 mmol/L — ABNORMAL LOW (ref 135–145)

## 2017-02-18 LAB — MAGNESIUM: Magnesium: 1.6 mg/dL — ABNORMAL LOW (ref 1.7–2.4)

## 2017-02-18 MED ORDER — MAGNESIUM SULFATE 50 % IJ SOLN
1.0000 g | Freq: Once | INTRAMUSCULAR | Status: AC
  Administered 2017-02-18: 1 g via INTRAVENOUS
  Filled 2017-02-18: qty 2

## 2017-02-18 MED ORDER — HEPARIN SOD (PORK) LOCK FLUSH 100 UNIT/ML IV SOLN
500.0000 [IU] | Freq: Once | INTRAVENOUS | Status: AC
  Administered 2017-02-18: 500 [IU] via INTRAVENOUS
  Filled 2017-02-18: qty 5

## 2017-02-18 MED ORDER — SODIUM CHLORIDE 0.9% FLUSH
10.0000 mL | Freq: Once | INTRAVENOUS | Status: AC
  Administered 2017-02-18: 10 mL via INTRAVENOUS
  Filled 2017-02-18: qty 10

## 2017-02-18 MED ORDER — SODIUM CHLORIDE 0.9 % IV SOLN
Freq: Once | INTRAVENOUS | Status: AC
Start: 2017-02-18 — End: 2017-02-18
  Administered 2017-02-18: 15:00:00 via INTRAVENOUS
  Filled 2017-02-18: qty 1000

## 2017-02-18 NOTE — Progress Notes (Signed)
[facility] day:  02/18/2017   Chief Complaint: Philip [name] is a 56 y.o. male with extensive stage small cell lung cancer who is seen for 2 week assessment.  HPI:  The patient was last seen in the medical oncology clinic on 02/04/2017.  At that time, he was doing better.  Esophageal burning had improved.  He had continued nausea.  Sodium was  low at 127.  He received IVF x 2 days.  He continued PCI.  Radiation completed on 02/15/2017.  During the interim, patient states, "I am ok, I guess". Patient is "weak and tired". Patient completed radiation therapy on 02/15/2017. Patient has continued nausea. Patient notes that he is drinking Gatorade after being advised of the hyponatremia. Patient states, "nothing tastes good anymore". Patient is gaining weight appropriately. Patient continues on Decadron 59m daily at this time. Esophageal burning has resolved.   Patient complains of continued back pain. He rates his pain 7/10 in the clinic today.    Past Medical History:  Diagnosis Date  . Anemia   . CAD (coronary artery disease)    a. 08/2014 Inf STEMI/CABG x 3 (LIMA->LAD, VG->Diag, RIMA->RCA).  . DVT, recurrent, [place], [place]  . Hyperlipidemia   . Hypertension   . Hypertensive heart disease   . Lung cancer [place]   . [place]    a. 03/2015 Periph Angio: short occlusion of L pop w/ evidence of embolization into the DP->5.0x50 mm Inova self-expanding stent;  b. 04/08/2015 ABI: R 1.12, L 0.99.  . Tobacco abuse   . Toe amputation status, [place] 07[place]09/2016   2017    No past surgical history on file.  Family History  Problem Relation Age of Onset  . Hypertension Father   . Lung cancer Father   . Heart attack Mother [phone]      STENT  . Hyperlipidemia Mother   . Heart attack Brother   . Heart attack Brother   . Breast cancer Sister   . Heart attack Sister   . Colon cancer Paternal  Grandfather     Social History:  reports that he has been smoking cigarettes.  He has been smoking about 0.50 packs per day. he has never used smokeless tobacco. He reports that he drinks alcohol. He reports that he does not use drugs.  The patient smokes 1/2 pack of cigarettes/day (previously 2 1/2 ppd).  He started smoking in his teens.  He denies any exposure to radiation or toxins.  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  Allergies:  Allergies  Allergen Reactions  . Amitriptyline Other (See Comments)    Burning sensation all over  . Contrast Media [Iodinated Diagnostic Agents] Other (See Comments)    Pt states that he got "knots behind his ears"  . Nsaids Other (See Comments)    Blood in stools  . Ibuprofen Other (See Comments) and Nausea Only    Blood in stools  . Tape Rash and Other (See Comments)    PLEASE USE COBAN WRAP; THE PATIENT'S SKIN TEARS WHEN BANDAGES ARE REMOVED!!    Current Medications: Current Outpatient Medications  Medication Sig Dispense Refill  . atorvastatin (LIPITOR) 40 MG tablet TAKE 1 TABLET BY MOUTH DAILY (Patient taking differently: Take 40 mg by mouth once a day)  90 tablet 3  . buPROPion (WELLBUTRIN XL) 150 MG 24 hr tablet TK 1 T PO QD  5  . Calcium Carb-Cholecalciferol (CALCIUM + D3 PO) Take 1 tablet by mouth 3 (three) times daily.    . clopidogrel (PLAVIX) 75 MG tablet Take 1 tablet (75 mg total) by mouth daily. 90 tablet 3  . dexamethasone (DECADRON) 4 MG tablet Take 1 tablet (4 mg total) by mouth 2 (two) times daily. 30 tablet 0  . diphenhydrAMINE (BENADRYL) 25 mg capsule Take 25 mg by mouth 2 (two) times daily as needed for allergies.     Marland Kitchen FLOVENT HFA 220 MCG/ACT inhaler INL 2 PFS PO BID  5  . lidocaine-prilocaine (EMLA) cream Apply to affected area once (Patient taking differently: Apply 1 application topically See admin instructions. TO AFFECTED  AREA AS DIRECTED) 30 g 3  . lisinopril (PRINIVIL,ZESTRIL) 5 MG tablet Take 1 tablet (5 mg total) by mouth daily. 90 tablet 1  . metoprolol tartrate (LOPRESSOR) 25 MG tablet TAKE 1 TABLET BY MOUTH TWICE DAILY 180 tablet 3  . ondansetron (ZOFRAN) 8 MG tablet Take 1 tablet (8 mg total) by mouth every 8 (eight) hours as needed for nausea or vomiting. 30 tablet 0  . oxyCODONE (OXY IR/ROXICODONE) 5 MG immediate release tablet Take 1 tablet (5 mg total) by mouth every 4 (four) hours as needed for severe pain. 40 tablet 0  . oxyCODONE (OXYCONTIN) 10 mg 12 hr tablet Take 1 tablet (10 mg total) by mouth every 12 (twelve) hours. 40 tablet 0  . pantoprazole (PROTONIX) 40 MG tablet TK 1 T PO QD  5  . PROAIR HFA 108 (90 Base) MCG/ACT inhaler INL 2 PFS PO Q 4 TO 6 H PRN  5  . sucralfate (CARAFATE) 1 g tablet Take 1 tablet (1 g total) by mouth 3 (three) times daily. Dissolve in 2-3 tbsp warm water, swish and swallow 90 tablet 3  . nystatin (MYCOSTATIN) 100000 UNIT/ML suspension Take 5 mLs (500,000 Units total) by mouth 4 (four) times daily. Swish and spit. (Patient not taking: Reported on 02/18/2017) 60 mL 0  . promethazine (PHENERGAN) 25 MG suppository Place 1 suppository (25 mg total) rectally every 6 (six) hours as needed for nausea or vomiting. (Patient not taking: Reported on 02/18/2017) 12 each 5  . STIOLTO RESPIMAT 2.5-2.5 MCG/ACT AERS INL 2 PFS PO QD  5   No current facility-administered medications for this visit.    Facility-Administered Medications Ordered in Other Visits  Medication Dose Route Frequency Provider Last Rate Last Dose  . heparin lock flush 100 unit/mL  500 Units Intravenous Once Corcoran, Melissa C, MD      . heparin lock flush 100 unit/mL  500 Units Intravenous Once Cammie Sickle, MD        Review of Systems:  GENERAL:  Feels "ok".  No fevers or sweats.  Weight up 2 pounds.  PERFORMANCE STATUS (ECOG):  1 HEENT:  No visual changes, runny nose, sore throat, mouth sores or  tenderness. Lungs: No shortness of breath or cough.  No hemoptysis. Cardiac:  No chest pain, palpitations, orthopnea, or PND. GI:  Eating.  Esophageal burning, resolved.  Nausea managed with Phenergan and Zofran.  No vomiting, diarrhea, constipation, melena or hematochezia.   GU:  No urgency, frequency, dysuria, or hematuria. Musculoskeletal:  Back pain (see HPI).  No joint pain.  No muscle tenderness. Extremities:  No pain or swelling. Left toe removed (old). Discoloration of toes (see HPI). Skin:  No rashes or skin changes. Neuro:  No headache, numbness or weakness, balance or coordination issues. Endocrine:  No diabetes, thyroid issues, hot flashes or night sweats. Psych:  No mood changes, depression or anxiety. Pain:  Chronic back pain 7 out of 10. Review of systems:  All other systems reviewed and found to be negative.  Physical Exam: Blood pressure (!) 158/101, pulse 91, temperature (!) 96.6 F (35.9 C), temperature source Tympanic, resp. rate 18, weight 144 lb 1.6 oz (65.4 kg). GENERAL:  Well developed, well nourished, gentleman sitting comfortably in the exam room in no acute distress. MENTAL STATUS: Alert and oriented to person, place and time. HEAD:Alopecia.  Normocephalic, atraumatic, face symmetric, no Cushingoid features. EYES:[place]. Pupils equal round and reactive to light and accomodation. No conjunctivitis or scleral icterus. YFV:CBSWHQPRFF clear without lesion. Tonguenormal. Poor dentition. Mucous membranes moist. RESPIRATORY:Clear to auscultationwithout rales, wheezes or rhonchi. CARDIOVASCULAR:Regular rate andrhythmwithout murmur, rub or gallop. ABDOMEN:Soft, non-tender, with active bowel sounds, and no hepatosplenomegaly. No masses. SKIN: Left hand well healed.  No evidence of infection. EXTREMITIES: No lower extremity edema, no skin discoloration or tenderness. No palpable cords. LYMPHNODES: No palpable cervical, supraclavicular,  axillary or inguinal adenopathy  NEUROLOGICAL: Unremarkable. PSYCH: Appropriate.   Infusion on 02/18/2017  Component Date Value Ref Range Status  . Magnesium 02/18/2017 1.6* 1.7 - 2.4 mg/dL Final  . WBC 02/18/2017 7.1  3.8 - 10.6 K/uL Final  . RBC 02/18/2017 3.65* 4.40 - 5.90 MIL/uL Final  . Hemoglobin 02/18/2017 13.1  13.0 - 18.0 g/dL Final  . HCT 02/18/2017 36.6* 40.0 - 52.0 % Final  . MCV 02/18/2017 100.5* 80.0 - 100.0 fL Final  . MCH 02/18/2017 35.8* 26.0 - 34.0 pg Final  . MCHC 02/18/2017 35.6  32.0 - 36.0 g/dL Final  . RDW 02/18/2017 15.3* 11.5 - 14.5 % Final  . Platelets 02/18/2017 134* 150 - 440 K/uL Final  . Neutrophils Relative % 02/18/2017 90  % Final  . Neutro Abs 02/18/2017 6.4  1.4 - 6.5 K/uL Final  . Lymphocytes Relative 02/18/2017 6  % Final  . Lymphs Abs 02/18/2017 0.4* 1.0 - 3.6 K/uL Final  . Monocytes Relative 02/18/2017 4  % Final  . Monocytes Absolute 02/18/2017 0.3  0.2 - 1.0 K/uL Final  . Eosinophils Relative 02/18/2017 0  % Final  . Eosinophils Absolute 02/18/2017 0.0  0 - 0.7 K/uL Final  . Basophils Relative 02/18/2017 0  % Final  . Basophils Absolute 02/18/2017 0.0  0 - 0.1 K/uL Final  . Sodium 02/18/2017 125* 135 - 145 mmol/L Final  . Potassium 02/18/2017 3.7  3.5 - 5.1 mmol/L Final  . Chloride 02/18/2017 93* 101 - 111 mmol/L Final  . CO2 02/18/2017 22  22 - 32 mmol/L Final  . Glucose, Bld 02/18/2017 156* 65 - 99 mg/dL Final  . BUN 02/18/2017 13  6 - 20 mg/dL Final  . Creatinine, Ser 02/18/2017 0.75  0.61 - 1.24 mg/dL Final  . Calcium 02/18/2017 8.4* 8.9 - 10.3 mg/dL Final  . GFR calc non Af Amer 02/18/2017 >60  >60 mL/min Final  . GFR calc Af Amer 02/18/2017 >60  >60 mL/min Final   Comment: (NOTE) The eGFR has been calculated using the CKD EPI equation. This calculation has not been validated in all clinical situations. eGFR's persistently <60 mL/min signify possible Chronic Kidney Disease.   . Anion gap 02/18/2017 10  5 - 15 Final     Assessment:  FODAY CONE is a 56 y.o. male with  extensive stage small cell lung cancer.  He presented with a 2 year history of night sweats and a 36 pound weight loss in 6 months.  Right supraclavicular biopsy on 07/19/2016 revealed small cell carcinoma of the lung.  CEA was 1.7 on 07/31/2016.  Chest CT on 06/28/2016 revealed extensive adenopathy.  There were multiple lymph nodes descending thoracic aorta, largest measuring 2.1 x 1.7 cm.  There was adenopathy in the aortopulmonary window with the largest confluence of lymph nodes measuring 4 x 2.6 cm. There was hilar adenopathy on the left measuring 2 x 1.9 cm. There were multiple enlarged lymph nodes in the pretracheal region, largest 1.7 x 1.7 cm.  There were lymph nodes to the left of the carina measuring 2.8 x 1.9 cm. There was a subcarinal lymph node measuring 2.4 x 1.6 cm. There was a 4 x 3 mm nodular opacity in the anterior segment of the left upper lobe.    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  There was nodal metastasis within the chest and supraclavicular/low jugular regions.  There was multifocal osseous metastasis.  There was possibly a pathologic fracture involving the posteromedial right tenth rib.  There was new left lower lobe opacity with mild hypermetabolism, suspicious for infection or postobstructive pneumonitis.  He completed a course of Levaquin for apost-obstructive pneumonia.  He experienced transient positional left facial burning.  Head MRI on 07/26/2016 revealed no evidence of brain metastasis. There was punctate DWI hyperintensity along a high right frontal gyrus, possible recent infarct.  There was a small occipital bone metastasis.  Head MRA on 07/27/2016 revealed normal large and medium sized vessels.  Bilateral carotid duplex on 07/27/2016 revealed <50% stenosis in the right and left internal carotis arteries.  Echo on 07/27/2016 revealed an EF of 45-50%  without cardiac source of emboli.  He received 4 cycles of carboplatin and etoposide (07/31/2016 - 10/02/2016) with OnPro Neulasta support.  Platelet nadir was 40,000 on day 16 of cycle #2 and 23,000 on day 15 of cycle #3.  He received 2 units of PRBC with cycle #3 and 1 unit with cycle #4.  He received Xgeva on 08/13/2016 (last 10/30/2016).  Chest CT on 09/11/2016 revealed a marked response to therapy.  There was near complete resolution of left suprahilar mass and thoracic adenopathy.  There was interval sclerosis indicative of healing site of osseous metastasis.  Thoracic spine MRI on 10/22/2016 reveals sclerotic enhancing lesion at T7 vertebral body on the left compatible with metastasis. There was a healing fracture the right medial 10th rib.  PET scan on 11/12/2016 revealed a new 10 mm hypermetabolic left axillary lymph node (SUV 3.6) due to a left hand abscess.  There were no other sites of metabolically active disease.  He completed a course of thoracic spine radiation (3000 cGy in 10 fractions) on 01/11/2017.  He received PCI (3000 cGy in 15 fractions) from 01/28/2017 - 02/15/2017.  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  Anemia work-up on 05/29/.2018 revealed the following normal labs:  Ferritin (219), iron sat (28%), TIBC (227), B12 (3055), and folate (6.8).  He has a history of left lower extremity DVT a few months after his cardiac surgery in 08/2014.  He has peripheral vascular disease s/p stent placement in 03/2015.  He is on a Plavix.  He has not had a colonoscopy in 30 years.  He was ineligible for the AbbVie clinical trial (M16-298) secondary to his left hand abscess.  He underwent  surgical debridement of a left hand abscess on 11/15/2016.  Wound cultures grew MRSA.  He was treated with emperic IV Vancomycin then Bactrim.   Symptomatically, he is doing "ok". Esophageal burning has resolved. Patient has continued nausea that is controlled with the prescribed antiemetics.  WBC  is 7,100 with an [place] of 6400. Hemoglobin is 13.1.  Potassium is 3.7. Sodium is low at 125.  Plan: 1.  Labs today:  CBC with diff, BMP, Mg. 2.  Discuss persistent hyponatremia. Sodium has decreased from 127 to 125 since last visit. Patient is consuming electrolyte replacement solutions (Gatorate). Will refer patient to nephrology for further evaluation. Patient scheduled to see Dr. Candiss Norse on 02/21/2017 at 11:00 am. 3.  Discuss nutrition. Patient with poor appetite. He notes dysgeusia. Discussed etiology likely related to oral corticosteroids. We discussed that this is likely to improve as the steroids are further tapered. Patient is not losing weight. We will readdress at next visit.  4.  Magnesium continues to be low. Result 1.6 today. Will replace with 1 gram of intravenous magnesium today. 5.  Patient feels "weak and tired". Sodium is low. Will give patient NS 500cc today. 6.  Continue to taper oral Decadron as directed by radiation oncology.  7.  RTC in 2 weeks for MD assessment, labs (CBC with , BMP, Mg), +/- IVF and Zofran.  Honor Loh, NP  02/18/2017, 2:07 PM   I saw and evaluated the patient, participating in the key portions of the service and reviewing pertinent diagnostic studies and records.  I reviewed the nurse practitioner's note and agree with the findings and the plan.  The assessment and plan were discussed with the patient.  A few questions were asked by the patient and answered.   Nolon Stalls, MD 02/18/2017, 2:07 PM

## 2017-02-18 NOTE — Progress Notes (Signed)
Here for follow up. Stated raised area on R side has increased in size. Per wife Dr Donella Stade gave pt cream for dry skin -didn't help.  Asking if Dr Mike Gip can look at it  Repeat BP @ 222 pm was 138/99  Dr Mike Gip informed

## 2017-02-20 ENCOUNTER — Other Ambulatory Visit: Payer: Self-pay | Admitting: *Deleted

## 2017-02-20 DIAGNOSIS — C7951 Secondary malignant neoplasm of bone: Secondary | ICD-10-CM

## 2017-02-20 DIAGNOSIS — Z7189 Other specified counseling: Secondary | ICD-10-CM

## 2017-02-20 DIAGNOSIS — G893 Neoplasm related pain (acute) (chronic): Secondary | ICD-10-CM

## 2017-02-20 DIAGNOSIS — C3490 Malignant neoplasm of unspecified part of unspecified bronchus or lung: Secondary | ICD-10-CM

## 2017-02-21 MED ORDER — OXYCODONE HCL 5 MG PO TABS: 5.0000 mg | ORAL_TABLET | ORAL | 0 refills | Status: DC | PRN

## 2017-02-24 ENCOUNTER — Encounter: Payer: Self-pay | Admitting: Hematology and Oncology

## 2017-02-25 ENCOUNTER — Other Ambulatory Visit: Payer: Self-pay | Admitting: *Deleted

## 2017-02-25 DIAGNOSIS — G893 Neoplasm related pain (acute) (chronic): Secondary | ICD-10-CM

## 2017-02-25 DIAGNOSIS — C3490 Malignant neoplasm of unspecified part of unspecified bronchus or lung: Secondary | ICD-10-CM

## 2017-02-25 DIAGNOSIS — Z7189 Other specified counseling: Secondary | ICD-10-CM

## 2017-02-25 DIAGNOSIS — C7951 Secondary malignant neoplasm of bone: Secondary | ICD-10-CM

## 2017-02-25 MED ORDER — DEXAMETHASONE 4 MG PO TABS: 4.0000 mg | ORAL_TABLET | Freq: Every day | ORAL | 0 refills | Status: DC

## 2017-02-25 MED ORDER — OXYCODONE HCL ER 10 MG PO T12A: 10.0000 mg | EXTENDED_RELEASE_TABLET | Freq: Two times a day (BID) | ORAL | 0 refills | Status: DC

## 2017-02-27 ENCOUNTER — Other Ambulatory Visit: Payer: Self-pay | Admitting: *Deleted

## 2017-02-27 ENCOUNTER — Inpatient Hospital Stay: Payer: Medicaid Other

## 2017-02-27 MED ORDER — FLUCONAZOLE 100 MG PO TABS: 100.0000 mg | ORAL_TABLET | Freq: Every day | ORAL | 3 refills | Status: DC

## 2017-03-01 ENCOUNTER — Inpatient Hospital Stay (HOSPITAL_BASED_OUTPATIENT_CLINIC_OR_DEPARTMENT_OTHER): Payer: Medicaid Other | Admitting: Hematology and Oncology

## 2017-03-01 ENCOUNTER — Other Ambulatory Visit: Payer: Self-pay | Admitting: *Deleted

## 2017-03-01 ENCOUNTER — Other Ambulatory Visit: Payer: Self-pay | Admitting: Hematology and Oncology

## 2017-03-01 ENCOUNTER — Encounter: Payer: Self-pay | Admitting: Hematology and Oncology

## 2017-03-01 ENCOUNTER — Inpatient Hospital Stay: Payer: Medicaid Other

## 2017-03-01 VITALS — BP 137/99 | HR 84 | Temp 97.6°F | Resp 20 | Wt 145.0 lb

## 2017-03-01 DIAGNOSIS — C3490 Malignant neoplasm of unspecified part of unspecified bronchus or lung: Secondary | ICD-10-CM

## 2017-03-01 DIAGNOSIS — Z8701 Personal history of pneumonia (recurrent): Secondary | ICD-10-CM

## 2017-03-01 DIAGNOSIS — I119 Hypertensive heart disease without heart failure: Secondary | ICD-10-CM

## 2017-03-01 DIAGNOSIS — Z85118 Personal history of other malignant neoplasm of bronchus and lung: Secondary | ICD-10-CM

## 2017-03-01 DIAGNOSIS — C3412 Malignant neoplasm of upper lobe, left bronchus or lung: Secondary | ICD-10-CM

## 2017-03-01 DIAGNOSIS — G8929 Other chronic pain: Secondary | ICD-10-CM

## 2017-03-01 DIAGNOSIS — G893 Neoplasm related pain (acute) (chronic): Secondary | ICD-10-CM

## 2017-03-01 DIAGNOSIS — Z8614 Personal history of Methicillin resistant Staphylococcus aureus infection: Secondary | ICD-10-CM

## 2017-03-01 DIAGNOSIS — C7951 Secondary malignant neoplasm of bone: Secondary | ICD-10-CM | POA: Diagnosis not present

## 2017-03-01 DIAGNOSIS — Z79899 Other long term (current) drug therapy: Secondary | ICD-10-CM

## 2017-03-01 DIAGNOSIS — Z7189 Other specified counseling: Secondary | ICD-10-CM

## 2017-03-01 DIAGNOSIS — E871 Hypo-osmolality and hyponatremia: Secondary | ICD-10-CM

## 2017-03-01 DIAGNOSIS — Z7902 Long term (current) use of antithrombotics/antiplatelets: Secondary | ICD-10-CM

## 2017-03-01 DIAGNOSIS — E785 Hyperlipidemia, unspecified: Secondary | ICD-10-CM | POA: Diagnosis not present

## 2017-03-01 DIAGNOSIS — Z923 Personal history of irradiation: Secondary | ICD-10-CM | POA: Diagnosis not present

## 2017-03-01 DIAGNOSIS — Z86718 Personal history of other venous thrombosis and embolism: Secondary | ICD-10-CM

## 2017-03-01 DIAGNOSIS — M545 Low back pain: Secondary | ICD-10-CM

## 2017-03-01 DIAGNOSIS — Z800 Family history of malignant neoplasm of digestive organs: Secondary | ICD-10-CM

## 2017-03-01 DIAGNOSIS — F17210 Nicotine dependence, cigarettes, uncomplicated: Secondary | ICD-10-CM

## 2017-03-01 DIAGNOSIS — I2510 Atherosclerotic heart disease of native coronary artery without angina pectoris: Secondary | ICD-10-CM | POA: Diagnosis not present

## 2017-03-01 DIAGNOSIS — Z803 Family history of malignant neoplasm of breast: Secondary | ICD-10-CM

## 2017-03-01 DIAGNOSIS — R110 Nausea: Secondary | ICD-10-CM

## 2017-03-01 DIAGNOSIS — I252 Old myocardial infarction: Secondary | ICD-10-CM

## 2017-03-01 DIAGNOSIS — Z951 Presence of aortocoronary bypass graft: Secondary | ICD-10-CM

## 2017-03-01 DIAGNOSIS — K0889 Other specified disorders of teeth and supporting structures: Secondary | ICD-10-CM

## 2017-03-01 DIAGNOSIS — I739 Peripheral vascular disease, unspecified: Secondary | ICD-10-CM

## 2017-03-01 DIAGNOSIS — D649 Anemia, unspecified: Secondary | ICD-10-CM

## 2017-03-01 DIAGNOSIS — Z801 Family history of malignant neoplasm of trachea, bronchus and lung: Secondary | ICD-10-CM

## 2017-03-01 DIAGNOSIS — Z7952 Long term (current) use of systemic steroids: Secondary | ICD-10-CM

## 2017-03-01 LAB — CBC WITH DIFFERENTIAL/PLATELET
Basophils Absolute: 0.1 10*3/uL (ref 0–0.1)
Basophils Relative: 2 %
Eosinophils Absolute: 0 10*3/uL (ref 0–0.7)
Eosinophils Relative: 0 %
HCT: 30 % — ABNORMAL LOW (ref 40.0–52.0)
Hemoglobin: 10.8 g/dL — ABNORMAL LOW (ref 13.0–18.0)
Lymphocytes Relative: 18 %
Lymphs Abs: 0.9 10*3/uL — ABNORMAL LOW (ref 1.0–3.6)
MCH: 36.4 pg — ABNORMAL HIGH (ref 26.0–34.0)
MCHC: 36 g/dL (ref 32.0–36.0)
MCV: 101 fL — ABNORMAL HIGH (ref 80.0–100.0)
Monocytes Absolute: 0.4 10*3/uL (ref 0.2–1.0)
Monocytes Relative: 8 %
Neutro Abs: 3.6 10*3/uL (ref 1.4–6.5)
Neutrophils Relative %: 72 %
Platelets: 126 10*3/uL — ABNORMAL LOW (ref 150–440)
RBC: 2.96 MIL/uL — ABNORMAL LOW (ref 4.40–5.90)
RDW: 14.9 % — ABNORMAL HIGH (ref 11.5–14.5)
WBC: 5 10*3/uL (ref 3.8–10.6)

## 2017-03-01 LAB — IRON AND TIBC
IRON: 178 ug/dL (ref 45–182)
Saturation Ratios: 69 % — ABNORMAL HIGH (ref 17.9–39.5)
TIBC: 260 ug/dL (ref 250–450)
UIBC: 82 ug/dL

## 2017-03-01 LAB — COMPREHENSIVE METABOLIC PANEL
ALT: 27 U/L (ref 17–63)
AST: 25 U/L (ref 15–41)
Albumin: 3.5 g/dL (ref 3.5–5.0)
Alkaline Phosphatase: 62 U/L (ref 38–126)
Anion gap: 8 (ref 5–15)
BUN: 13 mg/dL (ref 6–20)
CO2: 26 mmol/L (ref 22–32)
Calcium: 8.6 mg/dL — ABNORMAL LOW (ref 8.9–10.3)
Chloride: 92 mmol/L — ABNORMAL LOW (ref 101–111)
Creatinine, Ser: 0.79 mg/dL (ref 0.61–1.24)
GFR calc Af Amer: >60 mL/min (ref >60)
GFR calc non Af Amer: >60 mL/min (ref >60)
Glucose, Bld: 92 mg/dL (ref 65–99)
Potassium: 3.8 mmol/L (ref 3.5–5.1)
Sodium: 126 mmol/L — ABNORMAL LOW (ref 135–145)
Total Bilirubin: 0.9 mg/dL (ref 0.3–1.2)
Total Protein: 6.4 g/dL — ABNORMAL LOW (ref 6.5–8.1)

## 2017-03-01 LAB — MAGNESIUM: Magnesium: 1.4 mg/dL — ABNORMAL LOW (ref 1.7–2.4)

## 2017-03-01 LAB — FERRITIN: FERRITIN: 394 ng/mL — AB (ref 24–336)

## 2017-03-01 LAB — FOLATE: Folate: 6.4 ng/mL (ref >5.9)

## 2017-03-01 LAB — VITAMIN B12: Vitamin B-12: 158 pg/mL — ABNORMAL LOW (ref 180–914)

## 2017-03-01 MED ORDER — MAGNESIUM SULFATE 2 GM/50ML IV SOLN
2.0000 g | Freq: Once | INTRAVENOUS | Status: AC
  Administered 2017-03-01: 2 g via INTRAVENOUS
  Filled 2017-03-01: qty 50

## 2017-03-01 MED ORDER — OXYCODONE HCL 5 MG PO TABS: 5.0000 mg | ORAL_TABLET | ORAL | 0 refills | Status: DC | PRN

## 2017-03-01 MED ORDER — SODIUM CHLORIDE 0.9 % IV SOLN
Freq: Once | INTRAVENOUS | Status: AC
Start: 2017-03-01 — End: 2017-03-01
  Administered 2017-03-01: 15:00:00 via INTRAVENOUS
  Filled 2017-03-01: qty 1000

## 2017-03-01 MED ORDER — HEPARIN SOD (PORK) LOCK FLUSH 100 UNIT/ML IV SOLN
500.0000 [IU] | Freq: Once | INTRAVENOUS | Status: AC | PRN
  Administered 2017-03-01: 500 [IU]

## 2017-03-01 NOTE — Progress Notes (Signed)
[facility] day:  03/01/2017   Chief Complaint: Philip [name] is a 56 y.o. male with extensive stage small cell lung cancer who is seen for 2 week assessment.  HPI:  The patient was last seen in the medical oncology clinic on 02/18/2017.  At that time, he was doing "ok". Esophageal burning had resolved. Patient had continued nausea that was controlled with antiemetics.  He had chronic back pain.  WBC was 7,100 with an [place] of 6400. Hemoglobin was 13.1.  Potassium was 3.7.  Sodium was low at 125.  Magnesium was low.  He received IV magnesium.  He was referred to nephrology for ongoing hyponatremia.  He saw Dr. Candiss Norse on 02/21/2017.  High salt intake in the form of boost and protein shakes was recommended. Oral salt tablets were to be avoided as he was taking Decadron.  Low dose Lasix and a normal salt diet were also recommended. Patient's Lisinopril was discontinued.  He was to follow-up in clinic in 1 week with consideration of demeclocycline and Tolvaptan if no improvement.  During the interim, patient states, "I am kind of iffy today". Patient on 1/2 tablet Decadron every other day. He is scheduled to complete therapy next week (Saturday, 03/09/2017). Patient has had nausea and thrush. He was treated for oral candidiasis by Dr. Baruch Gouty. Patient's appetite has been decreased, however he is not losing weight. Patient continues to experience significant mid to lower back pain.  Pain is rated 7/10. He notes that the pain is in the same area where he received radiation. Patient denies any fevers or sweats.    Past Medical History:  Diagnosis Date  . Anemia   . CAD (coronary artery disease)    a. 08/2014 Inf STEMI/CABG x 3 (LIMA->LAD, VG->Diag, RIMA->RCA).  . DVT, recurrent, lower extremity, acute, left ([place]) 2016  . Hyperlipidemia   . Hypertension   . Hypertensive heart disease   . Lung cancer ([place])   . PAD (peripheral artery disease) ([place])    a.  03/2015 Periph Angio: short occlusion of L pop w/ evidence of embolization into the DP->5.0x50 mm Inova self-expanding stent;  b. 04/08/2015 ABI: R 1.12, L 0.99.  . Tobacco abuse   . Toe amputation status, left [facility]) 07/09/2016   2017    Past Surgical History:  Procedure Laterality Date  . Abdominal Aortogram w/Lower Extremity N/A 03/30/2015   Performed by Wellington Hampshire, MD at [facility]  . CORONARY ARTERY BYPASS GRAFTING (CABG), ON PUMP, TIMES THREE, USING BILATERAL MAMMARY ARTERIES, RIGHT GREATER SAPHENOUS VEIN HARVESTED ENDOSCOPICALLY N/A 08/25/2014   Performed by Melrose Nakayama, MD at [facility]  . Coronary Stent Intervention N/A 08/24/2014   Performed by Isaias Cowman, MD at [facility]  . INCISION AND DRAINAGE ABSCESS OF LEFT WRIST Left 11/15/2016   Performed by Roseanne Kaufman, MD at [facility]  . Left Heart Cath and Coronary Angiography N/A 08/24/2014   Performed by Isaias Cowman, MD at [facility]  . Porta Cath Insertion N/A 07/27/2016   Performed by Algernon Huxley, MD at [facility]  . TRANSESOPHAGEAL ECHOCARDIOGRAM (TEE) N/A 08/25/2014   Performed by Melrose Nakayama, MD at [facility] OR    Family History  Problem Relation Age of Onset  . Hypertension Father   . Lung cancer Father   . Heart attack Mother 8       STENT  . Hyperlipidemia Mother   .  Heart attack Brother   . Heart attack Brother   . Breast cancer Sister   . Heart attack Sister   . Colon cancer Paternal Grandfather     Social History:  reports that he has been smoking cigarettes.  He has been smoking about 0.50 packs per day. he has never used smokeless tobacco. He reports that he drinks alcohol. He reports that he does not use drugs.  The patient smokes 1/2 pack of cigarettes/day (previously 2 1/2 ppd).  He started smoking in his teens.  He denies any exposure to radiation or toxins.  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  He lives in [place].   His wife is named Publishing copy.  He is accompanied by his wife today.  Allergies:  Allergies  Allergen Reactions  . Amitriptyline Other (See Comments)    Burning sensation all over  . Contrast Media [Iodinated Diagnostic Agents] Other (See Comments)    Pt states that he got "knots behind his ears"  . Nsaids Other (See Comments)    Blood in stools  . Ibuprofen Other (See Comments) and Nausea Only    Blood in stools  . Tape Rash and Other (See Comments)    PLEASE USE COBAN WRAP; THE PATIENT'S SKIN TEARS WHEN BANDAGES ARE REMOVED!!    Current Medications: Current Outpatient Medications  Medication Sig Dispense Refill  . atorvastatin (LIPITOR) 40 MG tablet TAKE 1 TABLET BY MOUTH DAILY (Patient taking differently: Take 40 mg by mouth once a day) 90 tablet 3  . buPROPion (WELLBUTRIN XL) 150 MG 24 hr tablet TK 1 T PO QD  5  . Calcium Carb-Cholecalciferol (CALCIUM + D3 PO) Take 1 tablet by mouth 3 (three) times daily.    . clopidogrel (PLAVIX) 75 MG tablet Take 1 tablet (75 mg total) by mouth daily. 90 tablet 3  . dexamethasone (DECADRON) 4 MG tablet Take 1 tablet (4 mg total) daily by mouth. Take 1/2 tablet daily 10 tablet 0  . diphenhydrAMINE (BENADRYL) 25 mg capsule Take 25 mg by mouth 2 (two) times daily as needed for allergies.     Marland Kitchen FLOVENT HFA 220 MCG/ACT inhaler INL 2 PFS PO BID  5  . fluconazole (DIFLUCAN) 100 MG tablet Take 1 tablet (100 mg total) daily by mouth. 7 tablet 3  . lidocaine-prilocaine (EMLA) cream Apply to affected area once (Patient taking differently: Apply 1 application topically See admin instructions. TO AFFECTED AREA AS DIRECTED) 30 g 3  . metoprolol tartrate (LOPRESSOR) 25 MG tablet TAKE 1 TABLET BY MOUTH TWICE DAILY 180 tablet 3  . nystatin (MYCOSTATIN) 100000 UNIT/ML suspension Take 5 mLs (500,000 Units total) by mouth 4 (four) times daily. Swish and spit. 60 mL 0  . ondansetron (ZOFRAN) 8 MG tablet Take 1 tablet (8 mg total) by mouth every 8 (eight) hours as needed  for nausea or vomiting. 30 tablet 0  . oxyCODONE (OXY IR/ROXICODONE) 5 MG immediate release tablet Take 1 tablet (5 mg total) every 4 (four) hours as needed by mouth for severe pain. 40 tablet 0  . oxyCODONE (OXYCONTIN) 10 mg 12 hr tablet Take 1 tablet (10 mg total) every 12 (twelve) hours by mouth. 30 tablet 0  . pantoprazole (PROTONIX) 40 MG tablet TK 1 T PO QD  5  . PROAIR HFA 108 (90 Base) MCG/ACT inhaler INL 2 PFS PO Q 4 TO 6 H PRN  5  . STIOLTO RESPIMAT 2.5-2.5 MCG/ACT AERS INL 2 PFS PO QD  5  . sucralfate (CARAFATE) 1 g tablet Take 1 tablet (1 g total) by mouth 3 (three) times daily. Dissolve in 2-3 tbsp warm water, swish and swallow 90 tablet 3  . dexamethasone (DECADRON) 4 MG tablet Take 1 tablet (4 mg total) by mouth 2 (two) times daily. (Patient not taking: Reported on 03/01/2017) 30 tablet 0  . furosemide (LASIX) 20 MG tablet Take 20 mg daily by mouth.  1  . lisinopril (PRINIVIL,ZESTRIL) 5 MG tablet Take 1 tablet (5 mg total) by mouth daily. (Patient not taking: Reported on 03/01/2017) 90 tablet 1  . promethazine (PHENERGAN) 25 MG suppository Place 1 suppository (25 mg total) rectally every 6 (six) hours as needed for nausea or vomiting. (Patient not taking: Reported on 02/18/2017) 12 each 5   No current facility-administered medications for this visit.    Facility-Administered Medications Ordered in Other Visits  Medication Dose Route Frequency Provider Last Rate Last Dose  . heparin lock flush 100 unit/mL  500 Units Intravenous Once Lequita Asal, MD        Review of Systems:  GENERAL:  Feels "iffy".  No fevers or sweats.  Weight up 1 pound.  PERFORMANCE STATUS (ECOG):  1 HEENT:  No visual changes, runny nose, sore throat, mouth sores or tenderness. Lungs: No shortness of breath or cough.  No hemoptysis. Cardiac:  No chest pain, palpitations, orthopnea, or PND. GI:  Eating.  Nausea managed with Phenergan and Zofran.  No vomiting, diarrhea, constipation, melena or  hematochezia.   GU:  No urgency, frequency, dysuria, or hematuria. Musculoskeletal:  Back pain (ongoing).  No joint pain.  No muscle tenderness. Extremities:  No pain or swelling. Left toe removed (old). Discoloration of toes (see HPI). Skin:  No rashes or skin changes. Neuro:  No headache, numbness or weakness, balance or coordination issues. Endocrine:  No diabetes, thyroid issues, hot flashes or night sweats. Psych:  No mood changes, depression or anxiety. Pain:  Chronic back pain 7 out of 10. Review of systems:  All other systems reviewed and found to be negative.  Physical Exam: Blood pressure (!) 137/99, pulse 84, temperature 97.6 F (36.4 C), temperature source Tympanic, resp. rate 20, weight 145 lb (65.8 kg), SpO2 100 %. GENERAL:  Well developed, well nourished, gentleman sitting comfortably in the exam room in no acute distress. MENTAL STATUS: Alert and oriented to person, place and time. HEAD:Alopecia.  Normocephalic, atraumatic, face symmetric.  Cushingoid features. EYES:[place]. Pupils equal round and reactive to light and accomodation. No conjunctivitis or scleral icterus. ENT:No thrush.  Oropharynx clear without lesion. Tonguenormal. Poor dentition. Mucous membranes moist. RESPIRATORY:Clear to auscultationwithout rales, wheezes or rhonchi. CARDIOVASCULAR:Regular rate andrhythmwithout murmur, rub or gallop. ABDOMEN:Soft, non-tender, with active bowel sounds, and no hepatosplenomegaly. No masses. BACK:  Pain on palpation mid thoracic spine. SKIN: No rashes or ulcers. EXTREMITIES: No lower extremity edema, no skin discoloration or tenderness. No palpable cords. LYMPHNODES: No palpable cervical, supraclavicular, axillary or inguinal adenopathy  NEUROLOGICAL: Unremarkable. PSYCH: Appropriate.   Appointment on 03/01/2017  Component Date Value Ref Range Status  . Sodium 03/01/2017 126* 135 - 145 mmol/L Final  . Potassium 03/01/2017 3.8  3.5 - 5.1  mmol/L Final  . Chloride 03/01/2017 92* 101 - 111 mmol/L Final  . CO2 03/01/2017 26  22 - 32 mmol/L Final  . Glucose, Bld 03/01/2017 92  65 - 99 mg/dL Final  . BUN 03/01/2017 13  6 - 20 mg/dL Final  . Creatinine, Ser 03/01/2017 0.79  0.61 -  1.24 mg/dL Final  . Calcium 03/01/2017 8.6* 8.9 - 10.3 mg/dL Final  . Total Protein 03/01/2017 6.4* 6.5 - 8.1 g/dL Final  . Albumin 03/01/2017 3.5  3.5 - 5.0 g/dL Final  . AST 03/01/2017 25  15 - 41 U/L Final  . ALT 03/01/2017 27  17 - 63 U/L Final  . Alkaline Phosphatase 03/01/2017 62  38 - 126 U/L Final  . Total Bilirubin 03/01/2017 0.9  0.3 - 1.2 mg/dL Final  . GFR calc non Af Amer 03/01/2017 >60  >60 mL/min Final  . GFR calc Af Amer 03/01/2017 >60  >60 mL/min Final   Comment: (NOTE) The eGFR has been calculated using the CKD EPI equation. This calculation has not been validated in all clinical situations. eGFR's persistently <60 mL/min signify possible Chronic Kidney Disease.   . Anion gap 03/01/2017 8  5 - 15 Final  . Magnesium 03/01/2017 1.4* 1.7 - 2.4 mg/dL Final  . WBC 03/01/2017 5.0  3.8 - 10.6 K/uL Final  . RBC 03/01/2017 2.96* 4.40 - 5.90 MIL/uL Final  . Hemoglobin 03/01/2017 10.8* 13.0 - 18.0 g/dL Final  . HCT 03/01/2017 30.0* 40.0 - 52.0 % Final  . MCV 03/01/2017 101.0* 80.0 - 100.0 fL Final  . MCH 03/01/2017 36.4* 26.0 - 34.0 pg Final  . MCHC 03/01/2017 36.0  32.0 - 36.0 g/dL Final  . RDW 03/01/2017 14.9* 11.5 - 14.5 % Final  . Platelets 03/01/2017 126* 150 - 440 K/uL Final  . Neutrophils Relative % 03/01/2017 72  % Final  . Neutro Abs 03/01/2017 3.6  1.4 - 6.5 K/uL Final  . Lymphocytes Relative 03/01/2017 18  % Final  . Lymphs Abs 03/01/2017 0.9* 1.0 - 3.6 K/uL Final  . Monocytes Relative 03/01/2017 8  % Final  . Monocytes Absolute 03/01/2017 0.4  0.2 - 1.0 K/uL Final  . Eosinophils Relative 03/01/2017 0  % Final  . Eosinophils Absolute 03/01/2017 0.0  0 - 0.7 K/uL Final  . Basophils Relative 03/01/2017 2  % Final  .  Basophils Absolute 03/01/2017 0.1  0 - 0.1 K/uL Final    Assessment:  Philip [name] is a 56 y.o. male with extensive stage small cell lung cancer.  He presented with a 2 year history of night sweats and a 36 pound weight loss in 6 months.  Right supraclavicular biopsy on 07/19/2016 revealed small cell carcinoma of the lung.  CEA was 1.7 on 07/31/2016.  Chest CT on 06/28/2016 revealed extensive adenopathy.  There were multiple lymph nodes descending thoracic aorta, largest measuring 2.1 x 1.7 cm.  There was adenopathy in the aortopulmonary window with the largest confluence of lymph nodes measuring 4 x 2.6 cm. There was hilar adenopathy on the left measuring 2 x 1.9 cm. There were multiple enlarged lymph nodes in the pretracheal region, largest 1.7 x 1.7 cm.  There were lymph nodes to the left of the carina measuring 2.8 x 1.9 cm. There was a subcarinal lymph node measuring 2.4 x 1.6 cm. There was a 4 x 3 mm nodular opacity in the anterior segment of the left upper lobe.    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  There was nodal metastasis within the chest and supraclavicular/low jugular regions.  There was multifocal osseous metastasis.  There was possibly a pathologic fracture involving the posteromedial right tenth rib.  There was new left lower lobe opacity with mild hypermetabolism, suspicious for infection or postobstructive pneumonitis.  He completed a  course of Levaquin for apost-obstructive pneumonia.  He experienced transient positional left facial burning.  Head MRI on 07/26/2016 revealed no evidence of brain metastasis. There was punctate DWI hyperintensity along a high right frontal gyrus, possible recent infarct.  There was a small occipital bone metastasis.  Head MRA on 07/27/2016 revealed normal large and medium sized vessels.  Bilateral carotid duplex on 07/27/2016 revealed <50% stenosis in the right and left internal carotis  arteries.  Echo on 07/27/2016 revealed an EF of 45-50% without cardiac source of emboli.  He received 4 cycles of carboplatin and etoposide (07/31/2016 - 10/02/2016) with OnPro Neulasta support.  Platelet nadir was 40,000 on day 16 of cycle #2 and 23,000 on day 15 of cycle #3.  He received 2 units of PRBC with cycle #3 and 1 unit with cycle #4.  He received Xgeva on 08/13/2016 (last 10/30/2016).  Chest CT on 09/11/2016 revealed a marked response to therapy.  There was near complete resolution of left suprahilar mass and thoracic adenopathy.  There was interval sclerosis indicative of healing site of osseous metastasis.  Thoracic spine MRI on 10/22/2016 reveals sclerotic enhancing lesion at T7 vertebral body on the left compatible with metastasis. There was a healing fracture the right medial 10th rib.  PET scan on 11/12/2016 revealed a new 10 mm hypermetabolic left axillary lymph node (SUV 3.6) due to a left hand abscess.  There were no other sites of metabolically active disease.  He completed a course of thoracic spine radiation (3000 cGy in 10 fractions) on 01/11/2017.  He received PCI (3000 cGy in 15 fractions) from 01/28/2017 - 02/15/2017.  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  Anemia work-up on 05/29/.2018 revealed the following normal labs:  Ferritin (219), iron sat (28%), TIBC (227), B12 (3055), and folate (6.8).  He has a history of left lower extremity DVT a few months after his cardiac surgery in 08/2014.  He has peripheral vascular disease s/p stent placement in 03/2015.  He is on a Plavix.  He has not had a colonoscopy in 30 years.  He was ineligible for the AbbVie clinical trial (M16-298) secondary to his left hand abscess.  He underwent surgical debridement of a left hand abscess on 11/15/2016.  Wound cultures grew MRSA.  He was treated with emperic IV Vancomycin then Bactrim.   Symptomatically, he is doing "ok".  Esophageal burning has resolved.  Patient has continued nausea  that is controlled with the prescribed antiemetics.  He has significant back pain despite radiation. WBC is 5,000 with an [place] of 3600. Hemoglobin is 10.8.  Potassium is 3.8. Sodium is low at 126. Magnesium 1.4.  Plan: 1.  Labs today:  CBC with diff, CMP, Mg, B12, folate, iron studies, ferritin. 2.  Discuss persistent hyponatremia. Sodium 126. Patient is consuming electrolyte replacement solutions (Gatorate). Patient sees nephrology.  3.  Discuss nutrition. Patient with poor appetite. He notes dysgeusia. Discussed etiology likely related to oral corticosteroids. We discussed that this is likely to improve as the steroids are further tapered. Patient is not losing weight. Patient using nutritional shakes at this time.  4.  Magnesium continues to be low (1.4 today).  Will replace with 2 gram of intravenous magnesium today. 5.  Discuss pain control. Patient having significant pain in his back. Refill Rx: Oxycodone 52m q4h PRN (Disp # 40). 6.  Discuss pain in the back. Will schedule PET scan to further evaluate pain and ongoing hyponatremia worrisome for persistent disease. 7.  Continue to taper oral  Decadron as directed by radiation oncology.  8.  RTC after PET scan for MD assessment, labs (CBC with , BMP, Mg), +/- IVF and Zofran.   Honor Loh, NP  03/01/2017, 2:06 PM   I saw and evaluated the patient, participating in the key portions of the service and reviewing pertinent diagnostic studies and records.  I reviewed the nurse practitioner's note and agree with the findings and the plan.  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  The assessment and plan were discussed with the patient.  Several questions were asked by the patient and answered.   Nolon Stalls, MD 03/01/2017, 2:06 PM

## 2017-03-01 NOTE — Progress Notes (Signed)
Patient is on Diflucan for stomatitis.  States he is not eating well because of his mouth.

## 2017-03-01 NOTE — Progress Notes (Signed)
Per Honor Loh NP, pt to get Mag 2gm IV today.  No additional IVF.

## 2017-03-04 ENCOUNTER — Other Ambulatory Visit: Payer: Self-pay | Admitting: *Deleted

## 2017-03-04 DIAGNOSIS — G893 Neoplasm related pain (acute) (chronic): Secondary | ICD-10-CM

## 2017-03-04 DIAGNOSIS — Z7189 Other specified counseling: Secondary | ICD-10-CM

## 2017-03-04 DIAGNOSIS — C7951 Secondary malignant neoplasm of bone: Secondary | ICD-10-CM

## 2017-03-04 DIAGNOSIS — C3490 Malignant neoplasm of unspecified part of unspecified bronchus or lung: Secondary | ICD-10-CM

## 2017-03-04 NOTE — Telephone Encounter (Addendum)
Will need refill Friday and we will be closed, would like to pick up prescription Wednesday

## 2017-03-05 MED ORDER — OXYCODONE HCL 5 MG PO TABS: 5.0000 mg | ORAL_TABLET | ORAL | 0 refills | Status: DC | PRN

## 2017-03-11 ENCOUNTER — Other Ambulatory Visit: Payer: Self-pay | Admitting: *Deleted

## 2017-03-11 DIAGNOSIS — C7951 Secondary malignant neoplasm of bone: Secondary | ICD-10-CM

## 2017-03-11 DIAGNOSIS — C3490 Malignant neoplasm of unspecified part of unspecified bronchus or lung: Secondary | ICD-10-CM

## 2017-03-11 DIAGNOSIS — Z7189 Other specified counseling: Secondary | ICD-10-CM

## 2017-03-11 DIAGNOSIS — G893 Neoplasm related pain (acute) (chronic): Secondary | ICD-10-CM

## 2017-03-11 MED ORDER — OXYCODONE HCL ER 10 MG PO T12A: 10.0000 mg | EXTENDED_RELEASE_TABLET | Freq: Two times a day (BID) | ORAL | 0 refills | Status: DC

## 2017-03-12 ENCOUNTER — Telehealth: Payer: Self-pay | Admitting: *Deleted

## 2017-03-12 ENCOUNTER — Other Ambulatory Visit: Payer: Self-pay | Admitting: Urgent Care

## 2017-03-12 DIAGNOSIS — C3490 Malignant neoplasm of unspecified part of unspecified bronchus or lung: Secondary | ICD-10-CM

## 2017-03-12 MED ORDER — DIPHENHYDRAMINE HCL 50 MG PO TABS: ORAL_TABLET | ORAL | 0 refills | Status: DC

## 2017-03-12 MED ORDER — PREDNISONE 50 MG PO TABS: ORAL_TABLET | ORAL | 0 refills | Status: AC

## 2017-03-12 NOTE — Telephone Encounter (Signed)
Called patient to inform him that his PET scan was denied.  CT with contrast has been scheduled. Patient has mild allergy to contrast, therefore, premeds have been called to pharmacy.  Patient will need to pick up and take as follows. Sunday evening @ 9 pm - 50 mg Prednisone (1 pill), Monday morning 3 am - 50 mg Prednisone (1 pill), 9 am 50 mg Prednisone (one pill)  and 50 mg Benadryl.  Patient should arrive @ [place] @ 9:45 am for 10:00 am scan.  Between now and then patient should pick up contrast from radiology desk in medical mall.  Patient repeated back to me.

## 2017-03-13 ENCOUNTER — Ambulatory Visit: Payer: Medicaid Other

## 2017-03-15 ENCOUNTER — Inpatient Hospital Stay: Payer: Medicaid Other | Admitting: Hematology and Oncology

## 2017-03-15 ENCOUNTER — Other Ambulatory Visit: Payer: Self-pay | Admitting: *Deleted

## 2017-03-15 ENCOUNTER — Inpatient Hospital Stay: Payer: Medicaid Other

## 2017-03-15 ENCOUNTER — Other Ambulatory Visit: Payer: Self-pay | Admitting: Urgent Care

## 2017-03-15 DIAGNOSIS — C3490 Malignant neoplasm of unspecified part of unspecified bronchus or lung: Secondary | ICD-10-CM

## 2017-03-15 DIAGNOSIS — C7951 Secondary malignant neoplasm of bone: Secondary | ICD-10-CM

## 2017-03-15 DIAGNOSIS — G893 Neoplasm related pain (acute) (chronic): Secondary | ICD-10-CM

## 2017-03-15 DIAGNOSIS — Z7189 Other specified counseling: Secondary | ICD-10-CM

## 2017-03-15 MED ORDER — OXYCODONE HCL ER 10 MG PO T12A: 20.0000 mg | EXTENDED_RELEASE_TABLET | Freq: Two times a day (BID) | ORAL | 0 refills | Status: DC

## 2017-03-15 MED ORDER — OXYCODONE HCL 5 MG PO TABS: 5.0000 mg | ORAL_TABLET | ORAL | 0 refills | Status: DC | PRN

## 2017-03-15 NOTE — Telephone Encounter (Signed)
Patient requests refill of Oxycodone IR , he is using 40 tabs every week, He is also on Oxycontin 10 mg every 12 hours. Discussed with Dr Mike Gip and she ordered his Oxycontin to be increased to 20 mg every 12 hours and ok to refill Oxycodone.  I spoke with patient and advised of dose change in long acting Oxycodone. He repeated back to me and I also advised that prescription is ready for pick up

## 2017-03-18 ENCOUNTER — Ambulatory Visit
Admission: RE | Admit: 2017-03-18 | Discharge: 2017-03-18 | Disposition: A | Discharge status: Home / Self Care | Payer: Medicaid Other | Source: Ambulatory Visit | Attending: Urgent Care | Admitting: Urgent Care

## 2017-03-18 DIAGNOSIS — J439 Emphysema, unspecified: Secondary | ICD-10-CM | POA: Diagnosis not present

## 2017-03-18 DIAGNOSIS — C3490 Malignant neoplasm of unspecified part of unspecified bronchus or lung: Secondary | ICD-10-CM | POA: Insufficient documentation

## 2017-03-18 DIAGNOSIS — C7951 Secondary malignant neoplasm of bone: Secondary | ICD-10-CM | POA: Diagnosis not present

## 2017-03-18 DIAGNOSIS — K8020 Calculus of gallbladder without cholecystitis without obstruction: Secondary | ICD-10-CM | POA: Diagnosis not present

## 2017-03-18 DIAGNOSIS — I700 Atherosclerosis of aorta: Secondary | ICD-10-CM | POA: Diagnosis not present

## 2017-03-18 MED ORDER — IOPAMIDOL (ISOVUE-300) INJECTION 61%
100.0000 mL | Freq: Once | INTRAVENOUS | Status: AC | PRN
  Administered 2017-03-18: 100 mL via INTRAVENOUS

## 2017-03-21 ENCOUNTER — Inpatient Hospital Stay (HOSPITAL_BASED_OUTPATIENT_CLINIC_OR_DEPARTMENT_OTHER): Payer: Medicaid Other | Admitting: Hematology and Oncology

## 2017-03-21 ENCOUNTER — Encounter: Payer: Self-pay | Admitting: Hematology and Oncology

## 2017-03-21 ENCOUNTER — Inpatient Hospital Stay: Payer: Medicaid Other

## 2017-03-21 ENCOUNTER — Inpatient Hospital Stay: Payer: Medicaid Other | Attending: Hematology and Oncology

## 2017-03-21 ENCOUNTER — Other Ambulatory Visit: Payer: Self-pay

## 2017-03-21 VITALS — BP 114/77 | HR 105 | Temp 98.2°F | Wt 140.9 lb

## 2017-03-21 DIAGNOSIS — C7951 Secondary malignant neoplasm of bone: Secondary | ICD-10-CM

## 2017-03-21 DIAGNOSIS — E785 Hyperlipidemia, unspecified: Secondary | ICD-10-CM | POA: Insufficient documentation

## 2017-03-21 DIAGNOSIS — Z8701 Personal history of pneumonia (recurrent): Secondary | ICD-10-CM

## 2017-03-21 DIAGNOSIS — E876 Hypokalemia: Secondary | ICD-10-CM | POA: Diagnosis not present

## 2017-03-21 DIAGNOSIS — Z923 Personal history of irradiation: Secondary | ICD-10-CM | POA: Diagnosis not present

## 2017-03-21 DIAGNOSIS — R531 Weakness: Secondary | ICD-10-CM | POA: Insufficient documentation

## 2017-03-21 DIAGNOSIS — Z79899 Other long term (current) drug therapy: Secondary | ICD-10-CM

## 2017-03-21 DIAGNOSIS — D6481 Anemia due to antineoplastic chemotherapy: Secondary | ICD-10-CM | POA: Diagnosis not present

## 2017-03-21 DIAGNOSIS — F17210 Nicotine dependence, cigarettes, uncomplicated: Secondary | ICD-10-CM | POA: Insufficient documentation

## 2017-03-21 DIAGNOSIS — R634 Abnormal weight loss: Secondary | ICD-10-CM | POA: Diagnosis not present

## 2017-03-21 DIAGNOSIS — Z7902 Long term (current) use of antithrombotics/antiplatelets: Secondary | ICD-10-CM | POA: Insufficient documentation

## 2017-03-21 DIAGNOSIS — I2510 Atherosclerotic heart disease of native coronary artery without angina pectoris: Secondary | ICD-10-CM | POA: Diagnosis not present

## 2017-03-21 DIAGNOSIS — R112 Nausea with vomiting, unspecified: Secondary | ICD-10-CM | POA: Diagnosis not present

## 2017-03-21 DIAGNOSIS — I739 Peripheral vascular disease, unspecified: Secondary | ICD-10-CM

## 2017-03-21 DIAGNOSIS — C3412 Malignant neoplasm of upper lobe, left bronchus or lung: Secondary | ICD-10-CM | POA: Insufficient documentation

## 2017-03-21 DIAGNOSIS — T451X5S Adverse effect of antineoplastic and immunosuppressive drugs, sequela: Secondary | ICD-10-CM | POA: Insufficient documentation

## 2017-03-21 DIAGNOSIS — I119 Hypertensive heart disease without heart failure: Secondary | ICD-10-CM

## 2017-03-21 DIAGNOSIS — R630 Anorexia: Secondary | ICD-10-CM | POA: Insufficient documentation

## 2017-03-21 DIAGNOSIS — M549 Dorsalgia, unspecified: Secondary | ICD-10-CM | POA: Diagnosis not present

## 2017-03-21 DIAGNOSIS — C3490 Malignant neoplasm of unspecified part of unspecified bronchus or lung: Secondary | ICD-10-CM

## 2017-03-21 DIAGNOSIS — R5383 Other fatigue: Secondary | ICD-10-CM

## 2017-03-21 DIAGNOSIS — E871 Hypo-osmolality and hyponatremia: Secondary | ICD-10-CM

## 2017-03-21 DIAGNOSIS — Z951 Presence of aortocoronary bypass graft: Secondary | ICD-10-CM | POA: Insufficient documentation

## 2017-03-21 DIAGNOSIS — G893 Neoplasm related pain (acute) (chronic): Secondary | ICD-10-CM

## 2017-03-21 DIAGNOSIS — M545 Low back pain: Secondary | ICD-10-CM | POA: Diagnosis not present

## 2017-03-21 DIAGNOSIS — R110 Nausea: Secondary | ICD-10-CM | POA: Diagnosis not present

## 2017-03-21 DIAGNOSIS — G8929 Other chronic pain: Secondary | ICD-10-CM | POA: Insufficient documentation

## 2017-03-21 DIAGNOSIS — Z8614 Personal history of Methicillin resistant Staphylococcus aureus infection: Secondary | ICD-10-CM | POA: Insufficient documentation

## 2017-03-21 DIAGNOSIS — Z801 Family history of malignant neoplasm of trachea, bronchus and lung: Secondary | ICD-10-CM | POA: Insufficient documentation

## 2017-03-21 DIAGNOSIS — Z7189 Other specified counseling: Secondary | ICD-10-CM

## 2017-03-21 DIAGNOSIS — Z86718 Personal history of other venous thrombosis and embolism: Secondary | ICD-10-CM

## 2017-03-21 LAB — CBC WITH DIFFERENTIAL/PLATELET
Basophils Absolute: 0 10*3/uL (ref 0–0.1)
Basophils Relative: 1 %
Eosinophils Absolute: 0 10*3/uL (ref 0–0.7)
Eosinophils Relative: 1 %
HCT: 27.5 % — ABNORMAL LOW (ref 40.0–52.0)
Hemoglobin: 9.9 g/dL — ABNORMAL LOW (ref 13.0–18.0)
Lymphocytes Relative: 25 %
Lymphs Abs: 1 10*3/uL (ref 1.0–3.6)
MCH: 36.8 pg — ABNORMAL HIGH (ref 26.0–34.0)
MCHC: 36.1 g/dL — ABNORMAL HIGH (ref 32.0–36.0)
MCV: 101.9 fL — ABNORMAL HIGH (ref 80.0–100.0)
Monocytes Absolute: 0.3 10*3/uL (ref 0.2–1.0)
Monocytes Relative: 9 %
Neutro Abs: 2.5 10*3/uL (ref 1.4–6.5)
Neutrophils Relative %: 64 %
Platelets: 221 10*3/uL (ref 150–440)
RBC: 2.7 MIL/uL — ABNORMAL LOW (ref 4.40–5.90)
RDW: 15.2 % — ABNORMAL HIGH (ref 11.5–14.5)
WBC: 3.9 10*3/uL (ref 3.8–10.6)

## 2017-03-21 LAB — BASIC METABOLIC PANEL
Anion gap: 8 (ref 5–15)
BUN: 6 mg/dL (ref 6–20)
CO2: 25 mmol/L (ref 22–32)
Calcium: 7.9 mg/dL — ABNORMAL LOW (ref 8.9–10.3)
Chloride: 101 mmol/L (ref 101–111)
Creatinine, Ser: 0.7 mg/dL (ref 0.61–1.24)
GFR calc Af Amer: >60 mL/min (ref >60)
GFR calc non Af Amer: >60 mL/min (ref >60)
Glucose, Bld: 102 mg/dL — ABNORMAL HIGH (ref 65–99)
Potassium: 3.3 mmol/L — ABNORMAL LOW (ref 3.5–5.1)
Sodium: 134 mmol/L — ABNORMAL LOW (ref 135–145)

## 2017-03-21 LAB — ALBUMIN: Albumin: 3 g/dL — ABNORMAL LOW (ref 3.5–5.0)

## 2017-03-21 LAB — MAGNESIUM: Magnesium: 1.1 mg/dL — ABNORMAL LOW (ref 1.7–2.4)

## 2017-03-21 MED ORDER — SODIUM CHLORIDE 0.9% FLUSH
10.0000 mL | Freq: Once | INTRAVENOUS | Status: AC
  Administered 2017-03-21: 10 mL via INTRAVENOUS
  Filled 2017-03-21: qty 10

## 2017-03-21 MED ORDER — OXYCODONE HCL ER 20 MG PO T12A: 20.0000 mg | EXTENDED_RELEASE_TABLET | Freq: Two times a day (BID) | ORAL | 0 refills | Status: DC

## 2017-03-21 MED ORDER — SODIUM CHLORIDE 0.9 % IV SOLN
Freq: Once | INTRAVENOUS | Status: AC
  Administered 2017-03-21: 11:00:00 via INTRAVENOUS
  Filled 2017-03-21: qty 10

## 2017-03-21 MED ORDER — POTASSIUM CHLORIDE ER 10 MEQ PO TBCR
10.0000 meq | EXTENDED_RELEASE_TABLET | Freq: Every day | ORAL | 1 refills | Status: DC
Start: 2017-03-21 — End: 2017-05-16

## 2017-03-21 MED ORDER — HEPARIN SOD (PORK) LOCK FLUSH 100 UNIT/ML IV SOLN
500.0000 [IU] | Freq: Once | INTRAVENOUS | Status: AC
  Administered 2017-03-21: 500 [IU] via INTRAVENOUS
  Filled 2017-03-21: qty 5

## 2017-03-21 MED ORDER — POTASSIUM CHLORIDE 2 MEQ/ML IV SOLN: Freq: Once | INTRAVENOUS | Status: DC

## 2017-03-21 MED ORDER — MAGNESIUM OXIDE 400 (241.3 MG) MG PO TABS: 400.0000 mg | ORAL_TABLET | Freq: Every day | ORAL | 1 refills | Status: DC

## 2017-03-21 MED ORDER — SODIUM CHLORIDE 0.9 % IV SOLN
Freq: Once | INTRAVENOUS | Status: AC
  Administered 2017-03-21: 11:00:00 via INTRAVENOUS
  Filled 2017-03-21: qty 1000

## 2017-03-21 NOTE — Progress Notes (Signed)
Patient states that someone from our office called him to take his pain medications differently.  He was instructed to take OxyContin 4 every q 8 hours and take Oxy IR 1 tab every 6 hours.  He will need a refill on both pain medications today.  Reports not having much of an appetite and he has lost 5 lbs since last visit.  Pain level is 6/10 today located in mid back.

## 2017-03-21 NOTE — Progress Notes (Signed)
[facility] day:  03/21/2017   Chief Complaint: Philip [name] is a 56 y.o. male with extensive stage small cell lung cancer who is seen for 2 week assessment.  HPI:  The patient was last seen in the medical oncology clinic on 03/01/2017.  At that time,  he was doing "ok".  Esophageal burning had resolved.  He had continued nausea that was controlled with the prescribed antiemetics.  He had significant back pain despite radiation. WBC was 5,000 with an [place] of 3600. Hemoglobin was 10.8.  Potassium was 3.8. Sodium was low at 126. Magnesium was 1.4.  PET scan was scheduled to further evaluate pain and ongoing hyponatremia worrisome for persistent disease.  PET scan was not approved.  Chest, abdomen, and pelvic CT scan revealed new areas of irregular ground-glass in the medial aspects of the right upper and right lower lobes as well as somewhat amorphous ground-glass in the lingula and left lower lobe. Time interval favored infectious or inflammatory lesions. Continued attention on followup exams was warranted.  Osseous metastatic disease was stable.  There was resolved or resected left axillary lymph node.  Patient is out of his pain medications. He has been taking MS-Contin 12m tabs incorrectly. He is taking MS-Contin 123mx 4 tabs every 12 hours. There was some miscommunication regarding his dosing schedule. He states, "Somebody called me and told me to increase the MS-Contin and back off of the Oxycodone".  Symptomatically, he was "sick as a dog" last week. Patient notes that he "couldn't even keep water down". Patient was "out of it and lethargic". Patient did not call the office for advice. Patient was seen in consult by Dr. SiCandiss NorseHe was started on "salt pills". Patient planned to follow up with Dr. SiCandiss Norsehis afternoon.   He continues to feel weak and "wiped out". Patient's appetite has been decreased. He has lost 5 pounds since his last visit.   Patient continues to experience significant mid to lower back pain.  Pain is rated 6/10.  He notes that the pain is in the same area where he received radiation. Patient denies any fevers or sweats.    Past Medical History:  Diagnosis Date  . Anemia   . CAD (coronary artery disease)    a. 08/2014 Inf STEMI/CABG x 3 (LIMA->LAD, VG->Diag, RIMA->RCA).  . DVT, recurrent, lower extremity, acute, left ([facility]2016  . Hyperlipidemia   . Hypertension   . Hypertensive heart disease   . Lung cancer ([facility]  . PAD (peripheral artery disease) ([facility]   a. 03/2015 Periph Angio: short occlusion of L pop w/ evidence of embolization into the DP->5.0x50 mm Inova self-expanding stent;  b. 04/08/2015 ABI: R 1.12, L 0.99.  . Tobacco abuse   . [facility], [facility]   2017    Past Surgical History:  Procedure Laterality Date  . CARDIAC CATHETERIZATION N/A 08/24/2014   Procedure: Left Heart Cath and Coronary Angiography;  Surgeon: AlIsaias CowmanMD;  [facility];  Service: Cardiovascular;  Laterality: N/A;  . CARDIAC CATHETERIZATION N/A 08/24/2014   Procedure: Coronary Stent Intervention;  Surgeon: AlIsaias CowmanMD;  [facility];  Service: Cardiovascular;  Laterality: N/A;  . CORONARY ARTERY BYPASS GRAFT N/A 08/25/2014   Procedure: CORONARY ARTERY BYPASS GRAFTING (CABG), ON PUMP, TIMES THREE, USING BILATERAL MAMMARY ARTERIES, RIGHT GREATER SAPHENOUS VEIN HARVESTED ENDOSCOPICALLY;  Surgeon: StMelrose NakayamaMD;  [facility]  Service: Open Heart Surgery;  Laterality: N/A;  . INCISION AND DRAINAGE ABSCESS Left 11/15/2016   Procedure: INCISION AND DRAINAGE ABSCESS OF LEFT WRIST;  Surgeon: Roseanne Kaufman, MD;  Location: [facility];  Service: Orthopedics;  Laterality: Left;  . PERIPHERAL VASCULAR CATHETERIZATION N/A 03/30/2015   Procedure: Abdominal Aortogram w/Lower Extremity;  Surgeon: Wellington Hampshire, MD;  Location: [facility] CV LAB;   Service: Cardiovascular;  Laterality: N/A;  . PORTA CATH INSERTION N/A 07/27/2016   Procedure: Glori Luis Cath Insertion;  Surgeon: Algernon Huxley, MD;  Location: [facility] CV LAB;  Service: Cardiovascular;  Laterality: N/A;  . TEE WITHOUT CARDIOVERSION N/A 08/25/2014   Procedure: TRANSESOPHAGEAL ECHOCARDIOGRAM (TEE);  Surgeon: Melrose Nakayama, MD;  Location: [facility];  Service: Open Heart Surgery;  Laterality: N/A;    Family History  Problem Relation Age of Onset  . Hypertension Father   . Lung cancer Father   . Heart attack Mother 65       STENT  . Hyperlipidemia Mother   . Heart attack Brother   . Heart attack Brother   . Breast cancer Sister   . Heart attack Sister   . Colon cancer Paternal Grandfather     Social History:  reports that he has been smoking cigarettes.  He has been smoking about 0.50 packs per day. he has never used smokeless tobacco. He reports that he drinks alcohol. He reports that he does not use drugs.  The patient smokes 1/2 pack of cigarettes/day (previously 2 1/2 ppd).  He started smoking in his teens.  He denies any exposure to radiation or toxins.  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  He lives in [place].  His wife is named Publishing copy.  He is accompanied by his wife today.  Allergies:  Allergies  Allergen Reactions  . Amitriptyline Other (See Comments)    Burning sensation all over  . Contrast Media [Iodinated Diagnostic Agents] Other (See Comments)    Pt states that he got "knots behind his ears"  . Nsaids Other (See Comments)    Blood in stools  . Ibuprofen Other (See Comments) and Nausea Only    Blood in stools  . Tape Rash and Other (See Comments)    PLEASE USE COBAN WRAP; THE PATIENT'S SKIN TEARS WHEN BANDAGES ARE REMOVED!!    Current Medications: Current Outpatient Medications  Medication Sig Dispense Refill  . atorvastatin (LIPITOR) 40 MG tablet TAKE 1 TABLET BY MOUTH DAILY (Patient taking differently: Take 40 mg by mouth once  a day) 90 tablet 3  . buPROPion (WELLBUTRIN XL) 150 MG 24 hr tablet TK 1 T PO QD  5  . Calcium Carb-Cholecalciferol (CALCIUM + D3 PO) Take 1 tablet by mouth 3 (three) times daily.    . clopidogrel (PLAVIX) 75 MG tablet Take 1 tablet (75 mg total) by mouth daily. 90 tablet 3  . diphenhydrAMINE (BENADRYL) 25 mg capsule Take 25 mg by mouth 2 (two) times daily as needed for allergies.     . fluconazole (DIFLUCAN) 100 MG tablet Take 1 tablet (100 mg total) daily by mouth. 7 tablet 3  . furosemide (LASIX) 20 MG tablet Take 20 mg daily by mouth.  1  . lidocaine-prilocaine (EMLA) cream Apply to affected area once (Patient taking differently: Apply 1 application topically See admin instructions. TO AFFECTED AREA AS DIRECTED) 30 g 3  . metoprolol tartrate (LOPRESSOR) 25 MG tablet TAKE 1 TABLET BY MOUTH TWICE DAILY 180 tablet 3  .  nystatin (MYCOSTATIN) 100000 UNIT/ML suspension Take 5 mLs (500,000 Units total) by mouth 4 (four) times daily. Swish and spit. 60 mL 0  . ondansetron (ZOFRAN) 8 MG tablet Take 1 tablet (8 mg total) by mouth every 8 (eight) hours as needed for nausea or vomiting. 30 tablet 0  . oxyCODONE (OXY IR/ROXICODONE) 5 MG immediate release tablet Take 1 tablet (5 mg total) by mouth every 4 (four) hours as needed for severe pain. (Patient taking differently: Take 5 mg by mouth every 6 (six) hours as needed for severe pain. ) 40 tablet 0  . oxyCODONE (OXYCONTIN) 10 mg 12 hr tablet Take 2 tablets (20 mg total) by mouth every 12 (twelve) hours. (Patient taking differently: Take 40 mg by mouth every 8 (eight) hours. ) 30 tablet 0  . pantoprazole (PROTONIX) 40 MG tablet TK 1 T PO QD  5  . PROAIR HFA 108 (90 Base) MCG/ACT inhaler INL 2 PFS PO Q 4 TO 6 H PRN  5  . promethazine (PHENERGAN) 25 MG suppository Place 1 suppository (25 mg total) rectally every 6 (six) hours as needed for nausea or vomiting. 12 each 5  . STIOLTO RESPIMAT 2.5-2.5 MCG/ACT AERS INL 2 PFS PO QD  5  . Vitamin D, Ergocalciferol,  (DRISDOL) 50000 units CAPS capsule TK ONE C PO Q WEEK  5  . dexamethasone (DECADRON) 4 MG tablet Take 1 tablet (4 mg total) by mouth 2 (two) times daily. (Patient not taking: Reported on 03/01/2017) 30 tablet 0  . dexamethasone (DECADRON) 4 MG tablet Take 1 tablet (4 mg total) daily by mouth. Take 1/2 tablet daily (Patient not taking: Reported on 03/21/2017) 10 tablet 0  . diphenhydrAMINE (BENADRYL) 50 MG tablet Take 1 tab (38m) at 0900 on 12/3 prior to CT scan. 1 tablet 0  . FLOVENT HFA 220 MCG/ACT inhaler INL 2 PFS PO BID  5  . lisinopril (PRINIVIL,ZESTRIL) 5 MG tablet Take 1 tablet (5 mg total) by mouth daily. (Patient not taking: Reported on 03/21/2017) 90 tablet 1  . sucralfate (CARAFATE) 1 g tablet Take 1 tablet (1 g total) by mouth 3 (three) times daily. Dissolve in 2-3 tbsp warm water, swish and swallow (Patient not taking: Reported on 03/21/2017) 90 tablet 3   No current facility-administered medications for this visit.    Facility-Administered Medications Ordered in Other Visits  Medication Dose Route Frequency Provider Last Rate Last Dose  . heparin lock flush 100 unit/mL  500 Units Intravenous Once Corcoran, Melissa C, MD      . heparin lock flush 100 unit/mL  500 Units Intravenous Once CLequita Asal MD        Review of Systems:  GENERAL:  Feels "weak and wiped out".  No fevers or sweats.  Weight down 5 pounds.  PERFORMANCE STATUS (ECOG):  1 HEENT:  No visual changes, runny nose, sore throat, mouth sores or tenderness. Lungs: No shortness of breath or cough.  No hemoptysis. Cardiac:  No chest pain, palpitations, orthopnea, or PND. GI:  Appetite decreased.  Nausea and vomiting.  No diarrhea, constipation, melena or hematochezia.   GU:  No urgency, frequency, dysuria, or hematuria. Musculoskeletal:  Low back pain (ongoing).  No joint pain.  No muscle tenderness. Extremities:  No pain or swelling. Left toe removed (old). Discoloration of toes (see HPI). Skin:  No rashes or  skin changes. Neuro:  No headache, numbness or weakness, balance or coordination issues. Endocrine:  No diabetes, thyroid issues, hot flashes or night sweats.  Psych:  No mood changes, depression or anxiety. Pain:  Chronic back pain 7 out of 10. Review of systems:  All other systems reviewed and found to be negative.  Physical Exam: Blood pressure 114/77, pulse (!) 105, temperature 98.2 F (36.8 C), temperature source Tympanic, weight 140 lb 14.4 oz (63.9 kg). GENERAL:  Well developed, well nourished, gentleman sitting comfortably in the exam room in no acute distress. MENTAL STATUS: Alert and oriented to person, place and time. HEAD:Alopecia.  Normocephalic, atraumatic, face symmetric.  Cushingoid features. EYES:[place]. Pupils equal round and reactive to light and accomodation. No conjunctivitis or scleral icterus. ENT:No thrush.  Oropharynx clear without lesion. Tonguenormal. Poor dentition. Mucous membranes moist. RESPIRATORY:Clear to auscultationwithout rales, wheezes or rhonchi. CARDIOVASCULAR:Regular rate andrhythmwithout murmur, rub or gallop. ABDOMEN:Soft, non-tender, with active bowel sounds, and no hepatosplenomegaly. No masses. BACK:  Pain on palpation mid thoracic spine. SKIN: No rashes or ulcers. EXTREMITIES: No lower extremity edema, no skin discoloration or tenderness. No palpable cords. LYMPHNODES: No palpable cervical, supraclavicular, axillary or inguinal adenopathy  NEUROLOGICAL: Unremarkable. PSYCH: Appropriate.   Infusion on 03/21/2017  Component Date Value Ref Range Status  . Sodium 03/21/2017 134* 135 - 145 mmol/L Final  . Potassium 03/21/2017 3.3* 3.5 - 5.1 mmol/L Final  . Chloride 03/21/2017 101  101 - 111 mmol/L Final  . CO2 03/21/2017 25  22 - 32 mmol/L Final  . Glucose, Bld 03/21/2017 102* 65 - 99 mg/dL Final  . BUN 03/21/2017 6  6 - 20 mg/dL Final  . Creatinine, Ser 03/21/2017 0.70  0.61 - 1.24 mg/dL Final  . Calcium  03/21/2017 7.9* 8.9 - 10.3 mg/dL Final  . GFR calc non Af Amer 03/21/2017 >60  >60 mL/min Final  . GFR calc Af Amer 03/21/2017 >60  >60 mL/min Final   Comment: (NOTE) The eGFR has been calculated using the CKD EPI equation. This calculation has not been validated in all clinical situations. eGFR's persistently <60 mL/min signify possible Chronic Kidney Disease.   . Anion gap 03/21/2017 8  5 - 15 Final  . WBC 03/21/2017 3.9  3.8 - 10.6 K/uL Final  . RBC 03/21/2017 2.70* 4.40 - 5.90 MIL/uL Final  . Hemoglobin 03/21/2017 9.9* 13.0 - 18.0 g/dL Final  . HCT 03/21/2017 27.5* 40.0 - 52.0 % Final  . MCV 03/21/2017 101.9* 80.0 - 100.0 fL Final  . MCH 03/21/2017 36.8* 26.0 - 34.0 pg Final  . MCHC 03/21/2017 36.1* 32.0 - 36.0 g/dL Final  . RDW 03/21/2017 15.2* 11.5 - 14.5 % Final  . Platelets 03/21/2017 221  150 - 440 K/uL Final  . Neutrophils Relative % 03/21/2017 64  % Final  . Neutro Abs 03/21/2017 2.5  1.4 - 6.5 K/uL Final  . Lymphocytes Relative 03/21/2017 25  % Final  . Lymphs Abs 03/21/2017 1.0  1.0 - 3.6 K/uL Final  . Monocytes Relative 03/21/2017 9  % Final  . Monocytes Absolute 03/21/2017 0.3  0.2 - 1.0 K/uL Final  . Eosinophils Relative 03/21/2017 1  % Final  . Eosinophils Absolute 03/21/2017 0.0  0 - 0.7 K/uL Final  . Basophils Relative 03/21/2017 1  % Final  . Basophils Absolute 03/21/2017 0.0  0 - 0.1 K/uL Final  . Magnesium 03/21/2017 1.1* 1.7 - 2.4 mg/dL Final    Assessment:  Philip [name] is a 56 y.o. male with extensive stage small cell lung cancer.  He presented with a 2 year history of night sweats and a 36 pound weight loss in 6 months.  Right supraclavicular biopsy on 07/19/2016 revealed small cell carcinoma of the lung.  CEA was 1.7 on 07/31/2016.  Chest CT on 06/28/2016 revealed extensive adenopathy.  There were multiple lymph nodes descending thoracic aorta, largest measuring 2.1 x 1.7 cm.  There was adenopathy in the aortopulmonary window with the largest  confluence of lymph nodes measuring 4 x 2.6 cm. There was hilar adenopathy on the left measuring 2 x 1.9 cm. There were multiple enlarged lymph nodes in the pretracheal region, largest 1.7 x 1.7 cm.  There were lymph nodes to the left of the carina measuring 2.8 x 1.9 cm. There was a subcarinal lymph node measuring 2.4 x 1.6 cm. There was a 4 x 3 mm nodular opacity in the anterior segment of the left upper lobe.    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  There was nodal metastasis within the chest and supraclavicular/low jugular regions.  There was multifocal osseous metastasis.  There was possibly a pathologic fracture involving the posteromedial right tenth rib.  There was new left lower lobe opacity with mild hypermetabolism, suspicious for infection or postobstructive pneumonitis.  He completed a course of Levaquin for apost-obstructive pneumonia.  He experienced transient positional left facial burning.  Head MRI on 07/26/2016 revealed no evidence of brain metastasis. There was punctate DWI hyperintensity along a high right frontal gyrus, possible recent infarct.  There was a small occipital bone metastasis.  Head MRA on 07/27/2016 revealed normal large and medium sized vessels.  Bilateral carotid duplex on 07/27/2016 revealed <50% stenosis in the right and left internal carotis arteries.  Echo on 07/27/2016 revealed an EF of 45-50% without cardiac source of emboli.  He received 4 cycles of carboplatin and etoposide (07/31/2016 - 10/02/2016) with OnPro Neulasta support.  Platelet nadir was 40,000 on day 16 of cycle #2 and 23,000 on day 15 of cycle #3.  He received 2 units of PRBC with cycle #3 and 1 unit with cycle #4.  He received Xgeva on 08/13/2016 (last 10/30/2016).  Chest CT on 09/11/2016 revealed a marked response to therapy.  There was near complete resolution of left suprahilar mass and thoracic adenopathy.  There was interval  sclerosis indicative of healing site of osseous metastasis.  Thoracic spine MRI on 10/22/2016 reveals sclerotic enhancing lesion at T7 vertebral body on the left compatible with metastasis. There was a healing fracture the right medial 10th rib.  PET scan on 11/12/2016 revealed a new 10 mm hypermetabolic left axillary lymph node (SUV 3.6) due to a left hand abscess.  There were no other sites of metabolically active disease.  He completed a course of thoracic spine radiation (3000 cGy in 10 fractions) on 01/11/2017.  He received PCI (3000 cGy in 15 fractions) from 01/28/2017 - 02/15/2017.  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  Anemia work-up on 09/11/2016 revealed the following normal labs:  Ferritin (219), iron sat (28%), TIBC (227), B12 (3055), and folate (6.8).  He has a history of left lower extremity DVT a few months after his cardiac surgery in 08/2014.  He has peripheral vascular disease s/p stent placement in 03/2015.  He is on a Plavix.  He has not had a colonoscopy in 30 years.  He was ineligible for the AbbVie clinical trial (M16-298) secondary to his left hand abscess.  He underwent surgical debridement of a left hand abscess on 11/15/2016.  Wound cultures grew MRSA.  He was treated with emperic IV Vancomycin then Bactrim.  Symptomatically, he is "weak".  Esophageal burning has resolved.  Patient has continued nausea that is controlled with the prescribed antiemetics.  He has significant back pain despite radiation. WBC is 3900 with an ANC of 2500. Hemoglobin is 9.9.  Potassium is 3.3. Sodium is low at 134. Magnesium  Is 1.1.  Plan: 1.  Labs today:  CBC with diff, BMP, Mg, albumen. 2.  Discuss interval CT scan. No respiratory symptoms. 3.  Discuss persistent hyponatremia. Sodium 134 on sodium replacement tablets.  Patient is consuming electrolyte replacement solutions (Gatorate). Patient sees nephrology today.  4.  Discuss nutrition. Patient with poor appetite. He notes  dysgeusia. Discussed etiology likely related to oral corticosteroids. We discussed that will likely to improve as steroids are further tapered. Patient is losing weight. Patient using nutritional shakes at this time.  5.  Magnesium continues to be low (1.1 today).  Will replace with 4 grams of intravenous magnesium today. Will also start on oral replacement. Rx: Magox 437m PO daily.  6.  Discuss HYPOkalemia (3.3). Will replace with 219m potassium IV today. Will need oral replacement. Rx: Potassium chloride 1059mPO daily. 7.  Discuss pain control. Patient not taking pain medications correctly. Discussed plan at length. Patient to maintain pain diary and bring with him to his next RTC visit.  Refill Rx: MS-Contin 72m14mD PRN (Disp # 60). 8.   Continue to taper oral Decadron as directed by radiation oncology.  9.   Schedule bone scan on 03/25/2017.   10. RTC in 1 week (after bone scan) for MD assessment, labs (CBC with , BMP, Mg), +/- IVF and Zofran.   BryaHonor Loh  03/21/2017, 9:58 AM   I saw and evaluated the patient, participating in the key portions of the service and reviewing pertinent diagnostic studies and records.  I reviewed the nurse practitioner's note and agree with the findings and the plan.  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  The assessment and plan were discussed with the patient.  Several questions were asked by the patient and answered.   MeliNolon Stalls 03/21/2017, 9:58 AM

## 2017-03-25 ENCOUNTER — Ambulatory Visit: Payer: Medicaid Other | Admitting: Radiation Oncology

## 2017-03-26 ENCOUNTER — Other Ambulatory Visit: Payer: Self-pay | Admitting: *Deleted

## 2017-03-26 ENCOUNTER — Telehealth: Payer: Self-pay | Admitting: *Deleted

## 2017-03-26 ENCOUNTER — Other Ambulatory Visit: Payer: Self-pay | Admitting: Urgent Care

## 2017-03-26 DIAGNOSIS — C3490 Malignant neoplasm of unspecified part of unspecified bronchus or lung: Secondary | ICD-10-CM

## 2017-03-26 MED ORDER — OXYCODONE HCL 5 MG PO TABS: 5.0000 mg | ORAL_TABLET | ORAL | 0 refills | Status: DC | PRN

## 2017-03-26 NOTE — Telephone Encounter (Signed)
Informed that prescription is ready to pick up Left message on voice mail

## 2017-03-26 NOTE — Telephone Encounter (Signed)
Inadvertently discontinued. It read as a duplicate. The goal is for his to require less of the Oxycodone IR if he is taking the long acting as scheduled. I put the order back in and printed the Rx for Oxycodone IR 5mg  q4h PRN (Disp #40).

## 2017-03-26 NOTE — Telephone Encounter (Signed)
His Oxy IR was discontinued on 03/21/17, Does not say why. He is requesting a refill of it. Please advise

## 2017-03-31 ENCOUNTER — Encounter: Payer: Self-pay | Admitting: Hematology and Oncology

## 2017-04-04 ENCOUNTER — Other Ambulatory Visit: Payer: Self-pay | Admitting: *Deleted

## 2017-04-04 ENCOUNTER — Encounter: Admission: RE | Admit: 2017-04-04 | Payer: Medicaid Other | Source: Ambulatory Visit

## 2017-04-04 MED ORDER — OXYCODONE HCL 5 MG PO TABS: 5.0000 mg | ORAL_TABLET | ORAL | 0 refills | Status: DC | PRN

## 2017-04-05 ENCOUNTER — Telehealth: Payer: Self-pay | Admitting: *Deleted

## 2017-04-05 NOTE — Telephone Encounter (Signed)
Called patient and was made him aware of his appt. For his Bone Scan. Appt. was scheduled for 12:00 for the 1st part and 3:00 for the 2nd part  He is aware of his scheduled appt Date and time. I was also mail out a reminder letter.

## 2017-04-10 ENCOUNTER — Inpatient Hospital Stay: Payer: Medicaid Other

## 2017-04-10 ENCOUNTER — Encounter
Admission: RE | Admit: 2017-04-10 | Discharge: 2017-04-10 | Disposition: A | Discharge status: Home / Self Care | Payer: Medicaid Other | Source: Ambulatory Visit | Attending: Urgent Care | Admitting: Urgent Care

## 2017-04-10 DIAGNOSIS — E871 Hypo-osmolality and hyponatremia: Secondary | ICD-10-CM

## 2017-04-10 DIAGNOSIS — C7951 Secondary malignant neoplasm of bone: Secondary | ICD-10-CM

## 2017-04-10 DIAGNOSIS — C3490 Malignant neoplasm of unspecified part of unspecified bronchus or lung: Secondary | ICD-10-CM

## 2017-04-10 MED ORDER — TECHNETIUM TC 99M MEDRONATE IV KIT
25.0000 | PACK | Freq: Once | INTRAVENOUS | Status: AC | PRN
  Administered 2017-04-10: 22.02 via INTRAVENOUS

## 2017-04-11 ENCOUNTER — Inpatient Hospital Stay (HOSPITAL_BASED_OUTPATIENT_CLINIC_OR_DEPARTMENT_OTHER): Payer: Medicaid Other | Admitting: Hematology and Oncology

## 2017-04-11 ENCOUNTER — Other Ambulatory Visit: Payer: Self-pay | Admitting: Urgent Care

## 2017-04-11 ENCOUNTER — Other Ambulatory Visit: Payer: Self-pay

## 2017-04-11 ENCOUNTER — Inpatient Hospital Stay: Payer: Medicaid Other

## 2017-04-11 ENCOUNTER — Telehealth: Payer: Self-pay | Admitting: *Deleted

## 2017-04-11 ENCOUNTER — Encounter: Payer: Self-pay | Admitting: Radiation Oncology

## 2017-04-11 ENCOUNTER — Ambulatory Visit
Admission: RE | Admit: 2017-04-11 | Discharge: 2017-04-11 | Disposition: A | Discharge status: Home / Self Care | Payer: Medicaid Other | Source: Ambulatory Visit | Attending: Radiation Oncology | Admitting: Radiation Oncology

## 2017-04-11 VITALS — BP 117/84 | HR 78 | Temp 96.0°F | Resp 20 | Wt 132.7 lb

## 2017-04-11 VITALS — BP 108/73 | HR 81 | Temp 96.4°F | Resp 18 | Wt 132.9 lb

## 2017-04-11 DIAGNOSIS — R531 Weakness: Secondary | ICD-10-CM | POA: Diagnosis not present

## 2017-04-11 DIAGNOSIS — C7951 Secondary malignant neoplasm of bone: Secondary | ICD-10-CM | POA: Diagnosis not present

## 2017-04-11 DIAGNOSIS — D6481 Anemia due to antineoplastic chemotherapy: Secondary | ICD-10-CM

## 2017-04-11 DIAGNOSIS — E871 Hypo-osmolality and hyponatremia: Secondary | ICD-10-CM | POA: Insufficient documentation

## 2017-04-11 DIAGNOSIS — C3490 Malignant neoplasm of unspecified part of unspecified bronchus or lung: Secondary | ICD-10-CM

## 2017-04-11 DIAGNOSIS — R892 Abnormal level of other drugs, medicaments and biological substances in specimens from other organs, systems and tissues: Secondary | ICD-10-CM

## 2017-04-11 DIAGNOSIS — Z923 Personal history of irradiation: Secondary | ICD-10-CM | POA: Diagnosis not present

## 2017-04-11 DIAGNOSIS — G8929 Other chronic pain: Secondary | ICD-10-CM

## 2017-04-11 DIAGNOSIS — I2510 Atherosclerotic heart disease of native coronary artery without angina pectoris: Secondary | ICD-10-CM

## 2017-04-11 DIAGNOSIS — Z801 Family history of malignant neoplasm of trachea, bronchus and lung: Secondary | ICD-10-CM

## 2017-04-11 DIAGNOSIS — E876 Hypokalemia: Secondary | ICD-10-CM | POA: Insufficient documentation

## 2017-04-11 DIAGNOSIS — R634 Abnormal weight loss: Secondary | ICD-10-CM

## 2017-04-11 DIAGNOSIS — Z951 Presence of aortocoronary bypass graft: Secondary | ICD-10-CM

## 2017-04-11 DIAGNOSIS — M549 Dorsalgia, unspecified: Secondary | ICD-10-CM | POA: Diagnosis not present

## 2017-04-11 DIAGNOSIS — T451X5S Adverse effect of antineoplastic and immunosuppressive drugs, sequela: Secondary | ICD-10-CM

## 2017-04-11 DIAGNOSIS — R110 Nausea: Secondary | ICD-10-CM

## 2017-04-11 DIAGNOSIS — F17210 Nicotine dependence, cigarettes, uncomplicated: Secondary | ICD-10-CM | POA: Insufficient documentation

## 2017-04-11 DIAGNOSIS — R5383 Other fatigue: Secondary | ICD-10-CM | POA: Diagnosis not present

## 2017-04-11 DIAGNOSIS — I119 Hypertensive heart disease without heart failure: Secondary | ICD-10-CM

## 2017-04-11 DIAGNOSIS — E785 Hyperlipidemia, unspecified: Secondary | ICD-10-CM

## 2017-04-11 DIAGNOSIS — Z7902 Long term (current) use of antithrombotics/antiplatelets: Secondary | ICD-10-CM

## 2017-04-11 DIAGNOSIS — Z8614 Personal history of Methicillin resistant Staphylococcus aureus infection: Secondary | ICD-10-CM

## 2017-04-11 DIAGNOSIS — C3412 Malignant neoplasm of upper lobe, left bronchus or lung: Secondary | ICD-10-CM | POA: Insufficient documentation

## 2017-04-11 DIAGNOSIS — Z86718 Personal history of other venous thrombosis and embolism: Secondary | ICD-10-CM

## 2017-04-11 DIAGNOSIS — G893 Neoplasm related pain (acute) (chronic): Secondary | ICD-10-CM

## 2017-04-11 DIAGNOSIS — Z8701 Personal history of pneumonia (recurrent): Secondary | ICD-10-CM

## 2017-04-11 DIAGNOSIS — R630 Anorexia: Secondary | ICD-10-CM

## 2017-04-11 DIAGNOSIS — Z79899 Other long term (current) drug therapy: Secondary | ICD-10-CM

## 2017-04-11 DIAGNOSIS — Z7189 Other specified counseling: Secondary | ICD-10-CM

## 2017-04-11 DIAGNOSIS — M545 Low back pain: Secondary | ICD-10-CM

## 2017-04-11 DIAGNOSIS — I739 Peripheral vascular disease, unspecified: Secondary | ICD-10-CM

## 2017-04-11 LAB — BASIC METABOLIC PANEL
Anion gap: 6 (ref 5–15)
BUN: 6 mg/dL (ref 6–20)
CO2: 24 mmol/L (ref 22–32)
Calcium: 7.8 mg/dL — ABNORMAL LOW (ref 8.9–10.3)
Chloride: 103 mmol/L (ref 101–111)
Creatinine, Ser: 0.67 mg/dL (ref 0.61–1.24)
GFR calc Af Amer: >60 mL/min (ref >60)
GFR calc non Af Amer: >60 mL/min (ref >60)
Glucose, Bld: 88 mg/dL (ref 65–99)
Potassium: 3.5 mmol/L (ref 3.5–5.1)
Sodium: 133 mmol/L — ABNORMAL LOW (ref 135–145)

## 2017-04-11 LAB — CBC WITH DIFFERENTIAL/PLATELET
Basophils Absolute: 0.1 10*3/uL (ref 0–0.1)
Basophils Relative: 1 %
Eosinophils Absolute: 0.1 10*3/uL (ref 0–0.7)
Eosinophils Relative: 3 %
HCT: 29.5 % — ABNORMAL LOW (ref 40.0–52.0)
Hemoglobin: 10.2 g/dL — ABNORMAL LOW (ref 13.0–18.0)
Lymphocytes Relative: 17 %
Lymphs Abs: 0.7 10*3/uL — ABNORMAL LOW (ref 1.0–3.6)
MCH: 35 pg — ABNORMAL HIGH (ref 26.0–34.0)
MCHC: 34.7 g/dL (ref 32.0–36.0)
MCV: 100.7 fL — ABNORMAL HIGH (ref 80.0–100.0)
Monocytes Absolute: 0.5 10*3/uL (ref 0.2–1.0)
Monocytes Relative: 10 %
Neutro Abs: 3 10*3/uL (ref 1.4–6.5)
Neutrophils Relative %: 69 %
Platelets: 257 10*3/uL (ref 150–440)
RBC: 2.93 MIL/uL — ABNORMAL LOW (ref 4.40–5.90)
RDW: 14.3 % (ref 11.5–14.5)
WBC: 4.3 10*3/uL (ref 3.8–10.6)

## 2017-04-11 LAB — HEPATIC FUNCTION PANEL
ALT: 10 U/L — ABNORMAL LOW (ref 17–63)
AST: 21 U/L (ref 15–41)
Albumin: 2.6 g/dL — ABNORMAL LOW (ref 3.5–5.0)
Alkaline Phosphatase: 94 U/L (ref 38–126)
Bilirubin, Direct: <0.1 mg/dL — ABNORMAL LOW (ref 0.1–0.5)
Total Bilirubin: 0.8 mg/dL (ref 0.3–1.2)
Total Protein: 5.5 g/dL — ABNORMAL LOW (ref 6.5–8.1)

## 2017-04-11 LAB — URINE DRUG SCREEN, QUALITATIVE (ARMC ONLY)
Amphetamines, Ur Screen: NOT DETECTED
Barbiturates, Ur Screen: NOT DETECTED
Benzodiazepine, Ur Scrn: NOT DETECTED
Cannabinoid 50 Ng, Ur ~~LOC~~: POSITIVE — AB
Cocaine Metabolite,Ur ~~LOC~~: NOT DETECTED
MDMA (Ecstasy)Ur Screen: NOT DETECTED
Methadone Scn, Ur: NOT DETECTED
Opiate, Ur Screen: NOT DETECTED
Phencyclidine (PCP) Ur S: NOT DETECTED
Tricyclic, Ur Screen: NOT DETECTED

## 2017-04-11 LAB — MAGNESIUM: Magnesium: 1.4 mg/dL — ABNORMAL LOW (ref 1.7–2.4)

## 2017-04-11 MED ORDER — PROMETHAZINE HCL 25 MG RE SUPP: 25.0000 mg | Freq: Four times a day (QID) | RECTAL | 5 refills | Status: DC | PRN

## 2017-04-11 MED ORDER — OXYCODONE HCL 5 MG PO TABS: 5.0000 mg | ORAL_TABLET | ORAL | 0 refills | Status: DC | PRN

## 2017-04-11 MED ORDER — HEPARIN SOD (PORK) LOCK FLUSH 100 UNIT/ML IV SOLN
500.0000 [IU] | Freq: Once | INTRAVENOUS | Status: AC | PRN
  Administered 2017-04-11: 500 [IU]
  Filled 2017-04-11: qty 5

## 2017-04-11 MED ORDER — SODIUM CHLORIDE 0.9 % IV SOLN
Freq: Once | INTRAVENOUS | Status: AC
Start: 2017-04-11 — End: 2017-04-11
  Administered 2017-04-11: 13:00:00 via INTRAVENOUS
  Filled 2017-04-11: qty 1000

## 2017-04-11 MED ORDER — MAGNESIUM SULFATE 2 GM/50ML IV SOLN
2.0000 g | Freq: Once | INTRAVENOUS | Status: AC
  Administered 2017-04-11: 2 g via INTRAVENOUS
  Filled 2017-04-11: qty 50

## 2017-04-11 NOTE — Telephone Encounter (Signed)
Called patient to make him aware of his PET scan. PET scan was scheduled for  04/17/17 @ 8:30 and RTC on 04/25/17 for lab/MD and +/- IV Mg per 04/11/17 los. Left mess on patient vmail, also letter will be mailed out.

## 2017-04-11 NOTE — Progress Notes (Signed)
[facility] day:  04/11/2017   Chief Complaint: Philip [name] is a 56 y.o. male with extensive stage small cell lung cancer who is seen for review of interval bone scan and 3 week assessment.  HPI:  The patient was last seen in the medical oncology clinic on 112/09/2016.  At that time, he was "weak".  Esophageal burning had resolved.  He had continued nausea that was controlled with the prescribed antiemetics.  He had significant back pain despite radiation. WBC was 3900 with an ANC of 2500. Hemoglobin was 9.9.  Potassium was 3.3. Sodium was 134. Magnesium was 1.1.  Bone scan on 04/10/2017 revealed multiple foci of skeletal metastasis within the ribs, pelvis, and RIGHT clavicle.  During the interm, he states that he "can't eat".  He has an appointment with nutrition today.  He is sick every day.  Legs are weaker.  He notes shortness of breath.  He has pain in his upper back.  He takes oxycodone 4/day.  He initially states that he can not stand very long to do the dishes secondary to pain.  He then has to sit down.  Later he states that he lays in bed and crys because of pain.   Past Medical History:  Diagnosis Date  . Anemia   . CAD (coronary artery disease)    a. 08/2014 Inf STEMI/CABG x 3 (LIMA->LAD, VG->Diag, RIMA->RCA).  . DVT, recurrent, lower extremity, acute, left ([place]) 2016  . Hyperlipidemia   . Hypertension   . Hypertensive heart disease   . Lung cancer ([place])   . PAD (peripheral artery disease) ([place])    a. 03/2015 Periph Angio: short occlusion of L pop w/ evidence of embolization into the DP->5.0x50 mm Inova self-expanding stent;  b. 04/08/2015 ABI: R 1.12, L 0.99.  . Tobacco abuse   . Toe amputation status, left [facility]) 07/09/2016   2017    Past Surgical History:  Procedure Laterality Date  . CARDIAC CATHETERIZATION N/A 08/24/2014   Procedure: Left Heart Cath and Coronary Angiography;  Surgeon: Isaias Cowman, MD;   Location: [facility];  Service: Cardiovascular;  Laterality: N/A;  . CARDIAC CATHETERIZATION N/A 08/24/2014   Procedure: Coronary Stent Intervention;  Surgeon: Isaias Cowman, MD;  Location: [facility];  Service: Cardiovascular;  Laterality: N/A;  . CORONARY ARTERY BYPASS GRAFT N/A 08/25/2014   Procedure: CORONARY ARTERY BYPASS GRAFTING (CABG), ON PUMP, TIMES THREE, USING BILATERAL MAMMARY ARTERIES, RIGHT GREATER SAPHENOUS VEIN HARVESTED ENDOSCOPICALLY;  Surgeon: Melrose Nakayama, MD;  Location: [facility];  Service: Open Heart Surgery;  Laterality: N/A;  . INCISION AND DRAINAGE ABSCESS Left 11/15/2016   Procedure: INCISION AND DRAINAGE ABSCESS OF LEFT WRIST;  Surgeon: Roseanne Kaufman, MD;  Location: [facility];  Service: Orthopedics;  Laterality: Left;  . PERIPHERAL VASCULAR CATHETERIZATION N/A 03/30/2015   Procedure: Abdominal Aortogram w/Lower Extremity;  Surgeon: Wellington Hampshire, MD;  Location: [facility];  Service: Cardiovascular;  Laterality: N/A;  . PORTA CATH INSERTION N/A 07/27/2016   Procedure: Glori Luis Cath Insertion;  Surgeon: Algernon Huxley, MD;  Location: [facility];  Service: Cardiovascular;  Laterality: N/A;  . TEE WITHOUT CARDIOVERSION N/A 08/25/2014   Procedure: TRANSESOPHAGEAL ECHOCARDIOGRAM (TEE);  Surgeon: Melrose Nakayama, MD;  Location: [facility];  Service: Open Heart Surgery;  Laterality: N/A;    Family History  Problem Relation Age of Onset  . Hypertension Father   . Lung cancer Father   .  Heart attack Mother 8       STENT  . Hyperlipidemia Mother   . Heart attack Brother   . Heart attack Brother   . Breast cancer Sister   . Heart attack Sister   . Colon cancer Paternal Grandfather     Social History:  reports that he has been smoking cigarettes.  He has been smoking about 0.50 packs per day. he has never used smokeless tobacco. He reports that he drinks alcohol. He reports that he does not use drugs.  The patient smokes 1/2 pack of  cigarettes/day (previously 2 1/2 ppd).  He started smoking in his teens.  He denies any exposure to radiation or toxins.  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  He lives in [place].  His wife is named Publishing copy.  He is accompanied by his wife today.  Allergies:  Allergies  Allergen Reactions  . Amitriptyline Other (See Comments)    Burning sensation all over  . Contrast Media [Iodinated Diagnostic Agents] Other (See Comments)    Pt states that he got "knots behind his ears"  . Nsaids Other (See Comments)    Blood in stools  . Ibuprofen Other (See Comments) and Nausea Only    Blood in stools  . Tape Rash and Other (See Comments)    PLEASE USE COBAN WRAP; THE PATIENT'S SKIN TEARS WHEN BANDAGES ARE REMOVED!!    Current Medications: Current Outpatient Medications  Medication Sig Dispense Refill  . atorvastatin (LIPITOR) 40 MG tablet TAKE 1 TABLET BY MOUTH DAILY 90 tablet 3  . benzonatate (TESSALON) 100 MG capsule TK 1 C PO TID PRN  2  . buPROPion (WELLBUTRIN XL) 150 MG 24 hr tablet TK 1 T PO QD  5  . Calcium Carb-Cholecalciferol (CALCIUM + D3 PO) Take 1 tablet by mouth 3 (three) times daily.    . clopidogrel (PLAVIX) 75 MG tablet Take 1 tablet (75 mg total) by mouth daily. 90 tablet 3  . FLOVENT HFA 220 MCG/ACT inhaler INL 2 PFS PO BID  5  . furosemide (LASIX) 20 MG tablet Take 20 mg daily by mouth.  1  . lidocaine-prilocaine (EMLA) cream Apply to affected area once (Patient taking differently: Apply 1 application topically See admin instructions. TO AFFECTED AREA AS DIRECTED) 30 g 3  . magnesium oxide (MAG-OX) 400 (241.3 Mg) MG tablet Take 1 tablet (400 mg total) by mouth daily. 60 tablet 1  . metoprolol tartrate (LOPRESSOR) 25 MG tablet TAKE 1 TABLET BY MOUTH TWICE DAILY 180 tablet 3  . nystatin (MYCOSTATIN) 100000 UNIT/ML suspension Take 5 mLs (500,000 Units total) by mouth 4 (four) times daily. Swish and spit. 60 mL 0  . ondansetron (ZOFRAN) 8 MG tablet Take 1 tablet (8  mg total) by mouth every 8 (eight) hours as needed for nausea or vomiting. 30 tablet 0  . oxyCODONE (OXYCONTIN) 20 mg 12 hr tablet Take 1 tablet (20 mg total) by mouth every 12 (twelve) hours. 60 tablet 0  . pantoprazole (PROTONIX) 40 MG tablet TK 1 T PO QD  5  . potassium chloride (K-DUR) 10 MEQ tablet Take 1 tablet (10 mEq total) by mouth daily. 60 tablet 1  . PROAIR HFA 108 (90 Base) MCG/ACT inhaler INL 2 PFS PO Q 4 TO 6 H PRN  5  . STIOLTO RESPIMAT 2.5-2.5 MCG/ACT AERS INL 2 PFS PO QD  5  . sucralfate (CARAFATE) 1 g tablet Take 1 tablet (1 g total) by  mouth 3 (three) times daily. Dissolve in 2-3 tbsp warm water, swish and swallow 90 tablet 3  . Vitamin D, Ergocalciferol, (DRISDOL) 50000 units CAPS capsule TK ONE C PO Q WEEK  5  . diphenhydrAMINE (BENADRYL) 25 mg capsule Take 25 mg by mouth 2 (two) times daily as needed for allergies.     . diphenhydrAMINE (BENADRYL) 50 MG tablet Take 1 tab (75m) at 0900 on 12/3 prior to CT scan. 1 tablet 0  . fluconazole (DIFLUCAN) 100 MG tablet Take 1 tablet (100 mg total) daily by mouth. (Patient not taking: Reported on 04/11/2017) 7 tablet 3  . lisinopril (PRINIVIL,ZESTRIL) 5 MG tablet Take 1 tablet (5 mg total) by mouth daily. (Patient not taking: Reported on 03/21/2017) 90 tablet 1  . oxyCODONE (OXY IR/ROXICODONE) 5 MG immediate release tablet Take 1 tablet (5 mg total) by mouth every 4 (four) hours as needed for severe pain. 30 tablet 0  . promethazine (PHENERGAN) 25 MG suppository Place 1 suppository (25 mg total) rectally every 6 (six) hours as needed for nausea or vomiting. 12 each 5   No current facility-administered medications for this visit.    Facility-Administered Medications Ordered in Other Visits  Medication Dose Route Frequency Provider Last Rate Last Dose  . heparin lock flush 100 unit/mL  500 Units Intravenous Once CLequita Asal MD        Review of Systems:  GENERAL:  Feels "weak".  Fatigued.  No fevers or sweats.  Weight down 8  pounds.  PERFORMANCE STATUS (ECOG):  1 HEENT:  No visual changes, runny nose, sore throat, mouth sores or tenderness. Lungs: No shortness of breath or cough.  No hemoptysis. Cardiac:  No chest pain, palpitations, orthopnea, or PND. GI:  Appetite decreased.  Sick every day.  No diarrhea, constipation, melena or hematochezia.   GU:  No urgency, frequency, dysuria, or hematuria. Musculoskeletal:  Low back pain (chronic).  Upper back pain.  No joint pain.  No muscle tenderness. Extremities:  Legs weak.  No pain or swelling. Skin:  No rashes or skin changes. Neuro:  No headache, numbness or weakness, balance or coordination issues. Endocrine:  No diabetes, thyroid issues, hot flashes or night sweats. Psych:  No mood changes, depression or anxiety. Pain:  Chronic back pain 6 out of 10. Review of systems:  All other systems reviewed and found to be negative.  Physical Exam: Blood pressure 108/73, pulse 81, temperature (!) 96.4 F (35.8 C), temperature source Tympanic, resp. rate 18, weight 132 lb 14.4 oz (60.3 kg). GENERAL:  Thin gentleman sitting comfortably in the exam room in no acute distress.  He lloks to his wife when answering questions about his pain. MENTAL STATUS: Alert and oriented to person, place and time. HEAD:Alopecia.  Normocephalic, atraumatic, face symmetric.  Cushingoid features. EYES:[place]. Pupils equal round and reactive to light and accomodation. No conjunctivitis or scleral icterus. ENT:No thrush.  Oropharynx clear without lesion. Tonguenormal. Poor dentition. Mucous membranes moist. RESPIRATORY:Clear to auscultationwithout rales, wheezes or rhonchi. CARDIOVASCULAR:Regular rate andrhythmwithout murmur, rub or gallop. ABDOMEN:Soft, non-tender, with active bowel sounds, and no hepatosplenomegaly. No masses. BACK:  Pain on palpation cervical and mid thoracic spine. SKIN: No rashes or ulcers. EXTREMITIES: No lower extremity edema, no skin  discoloration or tenderness. No palpable cords. LYMPHNODES: No palpable cervical, supraclavicular, axillary or inguinal adenopathy  NEUROLOGICAL: Unremarkable. PSYCH: Appropriate.   Infusion on 04/11/2017  Component Date Value Ref Range Status  . Tricyclic, Ur Screen 167/12/4580NONE DETECTED  NONE DETECTED Final  .  Amphetamines, Ur Screen 04/11/2017 NONE DETECTED  NONE DETECTED Final  . MDMA (Ecstasy)Ur Screen 04/11/2017 NONE DETECTED  NONE DETECTED Final  . Cocaine Metabolite,Ur [place] 04/11/2017 NONE DETECTED  NONE DETECTED Final  . Opiate, Ur Screen 04/11/2017 NONE DETECTED  NONE DETECTED Final  . Phencyclidine (PCP) Ur S 04/11/2017 NONE DETECTED  NONE DETECTED Final  . Cannabinoid 50 Ng, Ur [place] 04/11/2017 POSITIVE* NONE DETECTED Final  . Barbiturates, Ur Screen 04/11/2017 NONE DETECTED  NONE DETECTED Final  . Benzodiazepine, Ur Scrn 04/11/2017 NONE DETECTED  NONE DETECTED Final  . Methadone Scn, Ur 04/11/2017 NONE DETECTED  NONE DETECTED Final   Comment: (NOTE) Tricyclics + metabolites, urine    Cutoff 1000 ng/mL Amphetamines + metabolites, urine  Cutoff 1000 ng/mL MDMA (Ecstasy), urine              Cutoff 500 ng/mL Cocaine Metabolite, urine          Cutoff 300 ng/mL Opiate + metabolites, urine        Cutoff 300 ng/mL Phencyclidine (PCP), urine         Cutoff 25 ng/mL Cannabinoid, urine                 Cutoff 50 ng/mL Barbiturates + metabolites, urine  Cutoff 200 ng/mL Benzodiazepine, urine              Cutoff 200 ng/mL Methadone, urine                   Cutoff 300 ng/mL The urine drug screen provides only a preliminary, unconfirmed analytical test result and should not be used for non-medical purposes. Clinical consideration and professional judgment should be applied to any positive drug screen result due to possible interfering substances. A more specific alternate chemical method must be used in order to obtain a confirmed analytical result. Gas chromatography / mass  spectrometry (GC/MS) is the preferred confirmat                          ory method. Performed at [facility], [address] [place], [place] [address]   Appointment on 04/11/2017  Component Date Value Ref Range Status  . Magnesium 04/11/2017 1.4* 1.7 - 2.4 mg/dL Final   Performed at [facility], [address] [place], [place] [address]  . Sodium 04/11/2017 133* 135 - 145 mmol/L Final  . Potassium 04/11/2017 3.5  3.5 - 5.1 mmol/L Final  . Chloride 04/11/2017 103  101 - 111 mmol/L Final  . CO2 04/11/2017 24  22 - 32 mmol/L Final  . Glucose, Bld 04/11/2017 88  65 - 99 mg/dL Final  . BUN 04/11/2017 6  6 - 20 mg/dL Final  . Creatinine, Ser 04/11/2017 0.67  0.61 - 1.24 mg/dL Final  . Calcium 04/11/2017 7.8* 8.9 - 10.3 mg/dL Final  . GFR calc non Af Amer 04/11/2017 >60  >60 mL/min Final  . GFR calc Af Amer 04/11/2017 >60  >60 mL/min Final   Comment: (NOTE) The eGFR has been calculated using the CKD EPI equation. This calculation has not been validated in all clinical situations. eGFR's persistently <60 mL/min signify possible Chronic Kidney Disease.   Georgiann Hahn gap 04/11/2017 6  5 - 15 Final   Performed at [facility], [address] [place], [place] [address]  . WBC 04/11/2017 4.3  3.8 - 10.6 K/uL Final  . RBC 04/11/2017 2.93* 4.40 - 5.90 MIL/uL Final  . Hemoglobin 04/11/2017  10.2* 13.0 - 18.0 g/dL Final  . HCT 04/11/2017 29.5* 40.0 - 52.0 % Final  . MCV 04/11/2017 100.7* 80.0 - 100.0 fL Final  . MCH 04/11/2017 35.0* 26.0 - 34.0 pg Final  . MCHC 04/11/2017 34.7  32.0 - 36.0 g/dL Final  . RDW 04/11/2017 14.3  11.5 - 14.5 % Final  . Platelets 04/11/2017 257  150 - 440 K/uL Final  . Neutrophils Relative % 04/11/2017 69  % Final  . Neutro Abs 04/11/2017 3.0  1.4 - 6.5 K/uL Final  . Lymphocytes Relative 04/11/2017 17  % Final  . Lymphs Abs 04/11/2017 0.7* 1.0 - 3.6 K/uL Final  . Monocytes Relative 04/11/2017 10  % Final  . Monocytes Absolute 04/11/2017  0.5  0.2 - 1.0 K/uL Final  . Eosinophils Relative 04/11/2017 3  % Final  . Eosinophils Absolute 04/11/2017 0.1  0 - 0.7 K/uL Final  . Basophils Relative 04/11/2017 1  % Final  . Basophils Absolute 04/11/2017 0.1  0 - 0.1 K/uL Final   Performed at [facility], [address] [place], [place] [address]  . Total Protein 04/11/2017 5.5* 6.5 - 8.1 g/dL Final  . Albumin 04/11/2017 2.6* 3.5 - 5.0 g/dL Final  . AST 04/11/2017 21  15 - 41 U/L Final  . ALT 04/11/2017 10* 17 - 63 U/L Final  . Alkaline Phosphatase 04/11/2017 94  38 - 126 U/L Final  . Total Bilirubin 04/11/2017 0.8  0.3 - 1.2 mg/dL Final  . Bilirubin, Direct 04/11/2017 <0.1* 0.1 - 0.5 mg/dL Final  . Indirect Bilirubin 04/11/2017 NOT CALCULATED  0.3 - 0.9 mg/dL Final   Performed at [facility], [address], [place], [place] [address]    Assessment:  SHAN VALDES is a 56 y.o. male with extensive stage small cell lung cancer.  He presented with a 2 year history of night sweats and a 36 pound weight loss in 6 months.  Right supraclavicular biopsy on 07/19/2016 revealed small cell carcinoma of the lung.  CEA was 1.7 on 07/31/2016.  Chest CT on 06/28/2016 revealed extensive adenopathy.  There were multiple lymph nodes descending thoracic aorta, largest measuring 2.1 x 1.7 cm.  There was adenopathy in the aortopulmonary window with the largest confluence of lymph nodes measuring 4 x 2.6 cm. There was hilar adenopathy on the left measuring 2 x 1.9 cm. There were multiple enlarged lymph nodes in the pretracheal region, largest 1.7 x 1.7 cm.  There were lymph nodes to the left of the carina measuring 2.8 x 1.9 cm. There was a subcarinal lymph node measuring 2.4 x 1.6 cm. There was a 4 x 3 mm nodular opacity in the anterior segment of the left upper lobe.    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  There was nodal metastasis within the chest and supraclavicular/low  jugular regions.  There was multifocal osseous metastasis.  There was possibly a pathologic fracture involving the posteromedial right tenth rib.  There was new left lower lobe opacity with mild hypermetabolism, suspicious for infection or postobstructive pneumonitis.  He completed a course of Levaquin for apost-obstructive pneumonia.  He experienced transient positional left facial burning.  Head MRI on 07/26/2016 revealed no evidence of brain metastasis. There was punctate DWI hyperintensity along a high right frontal gyrus, possible recent infarct.  There was a small occipital bone metastasis.  Head MRA on 07/27/2016 revealed normal large and medium sized vessels.  Bilateral carotid duplex on 07/27/2016  revealed <50% stenosis in the right and left internal carotis arteries.  Echo on 07/27/2016 revealed an EF of 45-50% without cardiac source of emboli.  He received 4 cycles of carboplatin and etoposide (07/31/2016 - 10/02/2016) with OnPro Neulasta support.  Platelet nadir was 40,000 on day 16 of cycle #2 and 23,000 on day 15 of cycle #3.  He received 2 units of PRBC with cycle #3 and 1 unit with cycle #4.  He received Xgeva on 08/13/2016 (last 10/30/2016).  Chest CT on 09/11/2016 revealed a marked response to therapy.  There was near complete resolution of left suprahilar mass and thoracic adenopathy.  There was interval sclerosis indicative of healing site of osseous metastasis.  Thoracic spine MRI on 10/22/2016 reveals sclerotic enhancing lesion at T7 vertebral body on the left compatible with metastasis. There was a healing fracture the right medial 10th rib.  PET scan on 11/12/2016 revealed a new 10 mm hypermetabolic left axillary lymph node (SUV 3.6) due to a left hand abscess.  There were no other sites of metabolically active disease.  He completed a course of thoracic spine radiation (3000 cGy in 10 fractions) on 01/11/2017.  He received PCI (3000 cGy in 15 fractions) from 01/28/2017 -  02/15/2017.  Chest, abdomen, and pelvic CT on 03/18/2017 revealed new areas of irregular ground-glass in the medial aspects of the right upper and right lower lobes as well as somewhat amorphous ground-glass in the lingula and left lower lobe. Time interval favored infectious or inflammatory lesions.  Osseous metastatic disease was stable.  There was resolved or resected left axillary lymph node.  Bone scan on 04/10/2017 revealed multiple foci of skeletal metastasis within the ribs, pelvis, and RIGHT clavicle.  Multiple areas of activity corresponding to sclerotic lesions on CT and non-hypermetabolic lesions on PET scan.  He has chemotherapy induced anemia.  Anemia work-up on 09/11/2016 revealed the following normal labs:  Ferritin (219), iron sat (28%), TIBC (227), B12 (3055), and folate (6.8).  He has a history of left lower extremity DVT a few months after his cardiac surgery in 08/2014.  He has peripheral vascular disease s/p stent placement in 03/2015.  He is on a Plavix.  He has not had a colonoscopy in 30 years.  He was ineligible for the AbbVie clinical trial ([id number]) secondary to his left hand abscess.  He underwent surgical debridement of a left hand abscess on 11/15/2016.  Wound cultures grew MRSA.  He was treated with emperic IV Vancomycin then Bactrim.   Symptomatically, he feels "weak".  He has continued nausea.  He has lost weight.  He has significant back pain despite radiation.  He is taking Oxycontin 20 mg BID and 4 oxycodone/day.  WBC is 4300 with an [place] of 3000. Hemoglobin is 10.2.  Potassium is 3.5. Sodium is low at 133. Magnesium  Is 1.4.  Plan: 1.  Labs today:  CBC with diff, CMP, Mg. 2.  Review interval bone scan- multiple areas of activity corresponding to sclerotic lesions on CT and non-hypermetabolic lesions on PET scan. 3.  Discuss bone scan with radiology.  Etiology of pain unclear.  Discuss PET scan. 4.  Follow-up with nutrition today. 5.  Magnesium sulfate 2 gm  IV. 6.  Increase oral magnesium to 1 tablet BID. 7.  Urine drug screen secondary to concerns. 8.  Rx: oxycodone 5 mg po q 6 hours prn pain (dis: #30), Phenergan 25 mg PR q 6 hours prn nausea (dis: #12; 5 refills). 9.  Schedule PET  scan. 10.  RTC after PET scan for MD assessment, labs (BMP, Mg), and +/- IV Mg.   Lequita Asal, MD  04/11/2017, 3:45 PM

## 2017-04-11 NOTE — Progress Notes (Signed)
Nutrition Assessment   Reason for Assessment:   Patient identified on Malnutrition Screening report for poor appetite and weight loss  ASSESSMENT:  56 year old male with small cell lung cancer.  Past medical history of CAD, HTN, HLD, PAD.    Met with patient and wife during infusion of Mag and IV fluids today. Patient reports poor appetite for the pat 2 months. Reports lack of taste and nausea, sometimes vomiting.  Reports can only take bite or two.  Reports ate 1/3rd of egg mcmuffin this am.  Has been drinking boost and some ensure.  Reports that he is taking nausea medication.    Noted recent bone scan  Nutrition Focused Physical Exam: deferred as patient fully clothed and wrapped up in blanket in infusion room  Medications: lasix, zofran, phenergan, decadron (tapering off), protonix  Labs: Mag 1.4 (replacing)  Anthropometrics:   Height: 71 inches Weight: 132 lb 11.5 oz  UBW: 150 lb per pt about 2 months ago but per chart noted 7/29 152 lb BMI: 19  13% weight loss in 5 months, significant   Estimated Energy Needs  Kcals: 1800-2100 calories/d Protein: 90-105 g/d Fluid: 2.1 L/d  NUTRITION DIAGNOSIS: Malnutrition related to cancer and cancer related side effects as evidenced by 13% weight loss in 5 months and eating < 75% of energy needs for > or equal to 1 month   MALNUTRITION DIAGNOSIS: Patient meets criteria for severe malnutrition in chronic illness as evidenced by 13% weight loss in 5 months and eating < 75% of energy needs for > or equal to 1 month   INTERVENTION:   Discussed with patient and wife ways to increase calories and protein.  Handout provided. Also discussed ways to handle taste changes.  Handout provided. Discussed oral nutrition supplements and encouraged higher calorie shakes.  Coupons given Encouraged patient to take nausea medication.    MONITORING, EVALUATION, GOAL: weight trends, intake   NEXT VISIT: Jan 10 during infusion   Tyse Auriemma B.  Zenia Resides, [place], [place] Registered Dietitian [phone] (pager)

## 2017-04-11 NOTE — Progress Notes (Signed)
Radiation Oncology Follow up Note  Name: Philip [name]   Date:   04/11/2017 MRN:  [id number] DOB: [birth year]    This 56 y.o. male presents to the clinic today for one-month follow-up status post PCI for extensive stage small cell lung cancer.  REFERRING PROVIDER: [name], MD  HPI: Patient is a 56 year old male now out 1 month having completed PCI for extensive stage small cell lung cancer with known bone metastasis.. Patient continues to be quite weak and nauseated. He continues to have significant back pain despite palliative radiation therapy. Bone scan shows excellent response to prior lesions in his spine. Today's labs he does have hyponatremia as well as low magnesium. As well as hypokalemia. He is seen medical oncology today for follow-up.  COMPLICATIONS OF TREATMENT: none  FOLLOW UP COMPLIANCE: keeps appointments   PHYSICAL EXAM:  BP 117/84   Pulse 78   Temp (!) 96 F (35.6 C)   Resp 20   Wt 132 lb 11.5 oz (60.2 kg)   BMI 18.51 kg/m  Thin frail male in NAD. Crude visual fields within normal range cranial nerves II through XII are grossly intact. No motor or sensory levels are identified. Well-developed well-nourished patient in NAD. HEENT reveals PERLA, EOMI, discs not visualized.  Oral cavity is clear. No oral mucosal lesions are identified. Neck is clear without evidence of cervical or supraclavicular adenopathy. Lungs are clear to A&P. Cardiac examination is essentially unremarkable with regular rate and rhythm without murmur rub or thrill. Abdomen is benign with no organomegaly or masses noted. Motor sensory and DTR levels are equal and symmetric in the upper and lower extremities. Cranial nerves II through XII are grossly intact. Proprioception is intact. No peripheral adenopathy or edema is identified. No motor or sensory levels are noted. Crude visual fields are within normal range.  RADIOLOGY RESULTS: Bone scan is reviewed compatible with the above-stated  findings  PLAN: At this time patient will be followed up by medical oncology. We will coordinate identified his abnormal electrolytes as well as altering and titrating his pain medication. I'm turning follow-up care over to medical oncology. I be happy to reevaluate the patient any time should further palliative treatment be indicated.  I would like to take this opportunity to thank you for allowing me to participate in the care of your patient.Noreene Filbert, MD

## 2017-04-11 NOTE — Progress Notes (Signed)
Here for follow up

## 2017-04-14 ENCOUNTER — Encounter: Payer: Self-pay | Admitting: Hematology and Oncology

## 2017-04-15 ENCOUNTER — Encounter: Payer: Medicaid Other | Admitting: Nurse Practitioner

## 2017-04-15 ENCOUNTER — Ambulatory Visit: Payer: Medicaid Other

## 2017-04-17 ENCOUNTER — Other Ambulatory Visit: Payer: Self-pay | Admitting: *Deleted

## 2017-04-17 ENCOUNTER — Telehealth: Payer: Self-pay | Admitting: *Deleted

## 2017-04-17 ENCOUNTER — Encounter: Payer: Self-pay | Admitting: Nurse Practitioner

## 2017-04-17 ENCOUNTER — Ambulatory Visit: Admission: RE | Admit: 2017-04-17 | Payer: Medicaid Other | Source: Ambulatory Visit

## 2017-04-17 ENCOUNTER — Inpatient Hospital Stay: Payer: Medicaid Other | Attending: Nurse Practitioner | Admitting: Nurse Practitioner

## 2017-04-17 ENCOUNTER — Inpatient Hospital Stay: Payer: Medicaid Other

## 2017-04-17 VITALS — BP 128/88 | HR 87 | Temp 98.2°F | Resp 18 | Wt 131.8 lb

## 2017-04-17 DIAGNOSIS — G893 Neoplasm related pain (acute) (chronic): Secondary | ICD-10-CM

## 2017-04-17 DIAGNOSIS — L02414 Cutaneous abscess of left upper limb: Secondary | ICD-10-CM | POA: Diagnosis not present

## 2017-04-17 DIAGNOSIS — Z79899 Other long term (current) drug therapy: Secondary | ICD-10-CM | POA: Insufficient documentation

## 2017-04-17 DIAGNOSIS — M546 Pain in thoracic spine: Secondary | ICD-10-CM | POA: Diagnosis not present

## 2017-04-17 DIAGNOSIS — R634 Abnormal weight loss: Secondary | ICD-10-CM | POA: Diagnosis not present

## 2017-04-17 DIAGNOSIS — T451X5S Adverse effect of antineoplastic and immunosuppressive drugs, sequela: Secondary | ICD-10-CM | POA: Insufficient documentation

## 2017-04-17 DIAGNOSIS — I2510 Atherosclerotic heart disease of native coronary artery without angina pectoris: Secondary | ICD-10-CM | POA: Insufficient documentation

## 2017-04-17 DIAGNOSIS — C3412 Malignant neoplasm of upper lobe, left bronchus or lung: Secondary | ICD-10-CM

## 2017-04-17 DIAGNOSIS — Z923 Personal history of irradiation: Secondary | ICD-10-CM | POA: Insufficient documentation

## 2017-04-17 DIAGNOSIS — Z9221 Personal history of antineoplastic chemotherapy: Secondary | ICD-10-CM | POA: Insufficient documentation

## 2017-04-17 DIAGNOSIS — M545 Low back pain: Secondary | ICD-10-CM | POA: Diagnosis not present

## 2017-04-17 DIAGNOSIS — Z8614 Personal history of Methicillin resistant Staphylococcus aureus infection: Secondary | ICD-10-CM | POA: Insufficient documentation

## 2017-04-17 DIAGNOSIS — Z86718 Personal history of other venous thrombosis and embolism: Secondary | ICD-10-CM | POA: Insufficient documentation

## 2017-04-17 DIAGNOSIS — E785 Hyperlipidemia, unspecified: Secondary | ICD-10-CM

## 2017-04-17 DIAGNOSIS — F17210 Nicotine dependence, cigarettes, uncomplicated: Secondary | ICD-10-CM | POA: Diagnosis not present

## 2017-04-17 DIAGNOSIS — Z886 Allergy status to analgesic agent status: Secondary | ICD-10-CM | POA: Insufficient documentation

## 2017-04-17 DIAGNOSIS — I119 Hypertensive heart disease without heart failure: Secondary | ICD-10-CM | POA: Insufficient documentation

## 2017-04-17 DIAGNOSIS — D6481 Anemia due to antineoplastic chemotherapy: Secondary | ICD-10-CM | POA: Diagnosis not present

## 2017-04-17 DIAGNOSIS — Z792 Long term (current) use of antibiotics: Secondary | ICD-10-CM | POA: Diagnosis not present

## 2017-04-17 DIAGNOSIS — G8929 Other chronic pain: Secondary | ICD-10-CM | POA: Diagnosis not present

## 2017-04-17 DIAGNOSIS — I739 Peripheral vascular disease, unspecified: Secondary | ICD-10-CM | POA: Insufficient documentation

## 2017-04-17 DIAGNOSIS — R110 Nausea: Secondary | ICD-10-CM | POA: Diagnosis not present

## 2017-04-17 DIAGNOSIS — G894 Chronic pain syndrome: Secondary | ICD-10-CM | POA: Diagnosis not present

## 2017-04-17 DIAGNOSIS — Z801 Family history of malignant neoplasm of trachea, bronchus and lung: Secondary | ICD-10-CM | POA: Insufficient documentation

## 2017-04-17 DIAGNOSIS — F1210 Cannabis abuse, uncomplicated: Secondary | ICD-10-CM | POA: Insufficient documentation

## 2017-04-17 DIAGNOSIS — R892 Abnormal level of other drugs, medicaments and biological substances in specimens from other organs, systems and tissues: Secondary | ICD-10-CM

## 2017-04-17 DIAGNOSIS — Z800 Family history of malignant neoplasm of digestive organs: Secondary | ICD-10-CM | POA: Insufficient documentation

## 2017-04-17 DIAGNOSIS — C7951 Secondary malignant neoplasm of bone: Secondary | ICD-10-CM | POA: Diagnosis not present

## 2017-04-17 DIAGNOSIS — C3490 Malignant neoplasm of unspecified part of unspecified bronchus or lung: Secondary | ICD-10-CM

## 2017-04-17 DIAGNOSIS — Z951 Presence of aortocoronary bypass graft: Secondary | ICD-10-CM

## 2017-04-17 DIAGNOSIS — Z803 Family history of malignant neoplasm of breast: Secondary | ICD-10-CM | POA: Insufficient documentation

## 2017-04-17 DIAGNOSIS — L0291 Cutaneous abscess, unspecified: Secondary | ICD-10-CM | POA: Insufficient documentation

## 2017-04-17 DIAGNOSIS — R630 Anorexia: Secondary | ICD-10-CM

## 2017-04-17 DIAGNOSIS — Z955 Presence of coronary angioplasty implant and graft: Secondary | ICD-10-CM | POA: Insufficient documentation

## 2017-04-17 LAB — URINE DRUG SCREEN, QUALITATIVE (ARMC ONLY)
AMPHETAMINES, UR SCREEN: NOT DETECTED
BENZODIAZEPINE, UR SCRN: NOT DETECTED
Barbiturates, Ur Screen: NOT DETECTED
COCAINE METABOLITE, UR ~~LOC~~: NOT DETECTED
Cannabinoid 50 Ng, Ur ~~LOC~~: NOT DETECTED
MDMA (ECSTASY) UR SCREEN: NOT DETECTED
METHADONE SCREEN, URINE: NOT DETECTED
OPIATE, UR SCREEN: NOT DETECTED
Phencyclidine (PCP) Ur S: NOT DETECTED
Tricyclic, Ur Screen: NOT DETECTED

## 2017-04-17 LAB — BASIC METABOLIC PANEL
ANION GAP: 7 (ref 5–15)
BUN: 6 mg/dL (ref 6–20)
CO2: 25 mmol/L (ref 22–32)
Calcium: 8.3 mg/dL — ABNORMAL LOW (ref 8.9–10.3)
Chloride: 102 mmol/L (ref 101–111)
Creatinine, Ser: 0.72 mg/dL (ref 0.61–1.24)
GFR calc Af Amer: >60 mL/min (ref >60)
GLUCOSE: 109 mg/dL — AB (ref 65–99)
POTASSIUM: 4.1 mmol/L (ref 3.5–5.1)
Sodium: 134 mmol/L — ABNORMAL LOW (ref 135–145)

## 2017-04-17 LAB — MAGNESIUM: MAGNESIUM: 1.4 mg/dL — AB (ref 1.7–2.4)

## 2017-04-17 MED ORDER — BACITRACIN 500 UNIT/GM EX OINT: 1.0000 "application " | TOPICAL_OINTMENT | Freq: Two times a day (BID) | CUTANEOUS | 0 refills | Status: DC

## 2017-04-17 NOTE — Telephone Encounter (Signed)
Patient called requesting that Dr Mike Gip return his call regarding his being denied narcotic refill today and being told he is not taking his medicine. He used some explicative's and said that if Dr C does not call him back he is going to come over here and Raise the biggest stink we have ever seen in our life.

## 2017-04-17 NOTE — Progress Notes (Signed)
Symptom Management Consult note [facility]  Telephone:([phone] [phone]  Patient Care Team: Lorelee Market, MD as PCP - General (Family Medicine)   Name of the patient: Philip [name]  [id number]  12-01-1960   Date of visit: 04/17/17  Diagnosis-small cell lung cancer  Chief complaint/ Reason for visit- pain and soft tissue infection  Heme/Onc history: Most recently seen by primary oncologist, Dr. Mike Gip, on 04/11/17.  Interval history-patient presents to symptom management today for complaints of continued pain.  Localizes pain to upper back s/p radiation. Bone scan from 04/10/17 revealed multiple foci of skeletal metastasis w/i ribs, pelvis, and right clavicle. He is currently taking oxycodone 5mg  q4h, and oxycontin 20mg  q12h. He reports taking his medications routinely. States last dose was this morning. Reports pain 6/10. Describes as 'aching'. States pain improves to 4/10 with medication. Denies constipation. Reports he is out of pain medications.   Patient also questioning soft tissue infection of left elbow. Was seen by PCP last week for same. Reports taking cipro. Med list says cephalexin. Patient unsure. States similar to previous infection on left wrist  No fever or chills. Using warm compresses. Associated symptoms: pain, warm, swelling, tenderness.   Additionally, complains that he has continued weight loss with nausea. Also endorses sob w/ exertion.  He was evaluated by dietitian on 04/11/17. Continues to smoke and drink alcohol. Denies drug use. Reports frustration with frequent appointments as he does not have a car and would like several weeks of medications at a time.   ECOG FS:1 - Symptomatic but completely ambulatory  Review of systems-  HEENT:  No visual changes, runny nose, sore throat, mouth sores or tenderness. Lungs: No shortness of breath or cough.  No hemoptysis. Cardiac:  No chest pain, palpitations, orthopnea,  or PND.  GI:  Appetite decreased. Chronic nausea. Denies vomiting. No diarrhea, constipation.   GU:  No urgency, frequency, dysuria, or hematuria. Musculoskeletal:  mid-low back pain. No muscle tenderness. Extremities:  No pain or swelling. Left toe amputation.  Skin:  No rash or pruritis Neuro:  No headache, loss of bowel or bladder function.  Endocrine:  No diabetes, thyroid issues, hot flashes or night sweats. Psych:  No mood changes, depression or anxiety. Review of systems:  All other systems reviewed and found to be negative.   Current treatment- surveillance  Allergies  Allergen Reactions  . Amitriptyline Other (See Comments)    Burning sensation all over  . Contrast Media [Iodinated Diagnostic Agents] Other (See Comments)    Pt states that he got "knots behind his ears"  . Nsaids Other (See Comments)    Blood in stools  . Ibuprofen Other (See Comments) and Nausea Only    Blood in stools  . Tape Rash and Other (See Comments)    PLEASE USE COBAN WRAP; THE PATIENT'S SKIN TEARS WHEN BANDAGES ARE REMOVED!!     Past Medical History:  Diagnosis Date  . Anemia   . CAD (coronary artery disease)    a. 08/2014 Inf STEMI/CABG x 3 (LIMA->LAD, VG->Diag, RIMA->RCA).  . DVT, recurrent, lower extremity, acute, left ([place]) 2016  . Hyperlipidemia   . Hypertension   . Hypertensive heart disease   . Lung cancer ([place])   . PAD (peripheral artery disease) ([place])    a. 03/2015 Periph Angio: short occlusion of L pop w/ evidence of embolization into the DP->5.0x50 mm Inova self-expanding stent;  b. 04/08/2015 ABI: R 1.12, L 0.99.  . Tobacco abuse   .  Toe amputation status, left [facility]) 07/09/2016   2017     Past Surgical History:  Procedure Laterality Date  . CARDIAC CATHETERIZATION N/A 08/24/2014   Procedure: Left Heart Cath and Coronary Angiography;  Surgeon: Isaias Cowman, MD;  Location: [facility];  Service: Cardiovascular;  Laterality: N/A;  . CARDIAC CATHETERIZATION  N/A 08/24/2014   Procedure: Coronary Stent Intervention;  Surgeon: Isaias Cowman, MD;  Location: [facility];  Service: Cardiovascular;  Laterality: N/A;  . CORONARY ARTERY BYPASS GRAFT N/A 08/25/2014   Procedure: CORONARY ARTERY BYPASS GRAFTING (CABG), ON PUMP, TIMES THREE, USING BILATERAL MAMMARY ARTERIES, RIGHT GREATER SAPHENOUS VEIN HARVESTED ENDOSCOPICALLY;  Surgeon: Melrose Nakayama, MD;  Location: [facility];  Service: Open Heart Surgery;  Laterality: N/A;  . INCISION AND DRAINAGE ABSCESS Left 11/15/2016   Procedure: INCISION AND DRAINAGE ABSCESS OF LEFT WRIST;  Surgeon: Roseanne Kaufman, MD;  Location: [facility];  Service: Orthopedics;  Laterality: Left;  . PERIPHERAL VASCULAR CATHETERIZATION N/A 03/30/2015   Procedure: Abdominal Aortogram w/Lower Extremity;  Surgeon: Wellington Hampshire, MD;  Location: [facility];  Service: Cardiovascular;  Laterality: N/A;  . PORTA CATH INSERTION N/A 07/27/2016   Procedure: Glori Luis Cath Insertion;  Surgeon: Algernon Huxley, MD;  Location: [facility];  Service: Cardiovascular;  Laterality: N/A;  . TEE WITHOUT CARDIOVERSION N/A 08/25/2014   Procedure: TRANSESOPHAGEAL ECHOCARDIOGRAM (TEE);  Surgeon: Melrose Nakayama, MD;  Location: [facility];  Service: Open Heart Surgery;  Laterality: N/A;    Social History   Socioeconomic History  . Marital status: Married    Spouse name: Not on file  . Number of children: Not on file  . Years of education: Not on file  . Highest education level: Not on file  Social Needs  . Financial resource strain: Not on file  . Food insecurity - worry: Not on file  . Food insecurity - inability: Not on file  . Transportation needs - medical: Not on file  . Transportation needs - non-medical: Not on file  Occupational History  . Not on file  Tobacco Use  . Smoking status: Current Every Day Smoker    Packs/day: 0.50    Types: Cigarettes  . Smokeless tobacco: Never Used  . Tobacco comment: 4cigs day down from  2ppd  Substance and Sexual Activity  . Alcohol use: Yes    Alcohol/week: 0.0 oz    Comment: Sometimes on w/e.  . Drug use: No  . Sexual activity: Not on file  Other Topics Concern  . Not on file  Social History Narrative  . Not on file    Family History  Problem Relation Age of Onset  . Hypertension Father   . Lung cancer Father   . Heart attack Mother 67       STENT  . Hyperlipidemia Mother   . Heart attack Brother   . Heart attack Brother   . Breast cancer Sister   . Heart attack Sister   . Colon cancer Paternal Grandfather      Current Outpatient Medications:  .  atorvastatin (LIPITOR) 40 MG tablet, TAKE 1 TABLET BY MOUTH DAILY, Disp: 90 tablet, Rfl: 3 .  bacitracin 500 UNIT/GM ointment, Apply 1 application topically 2 (two) times daily., Disp: 15 g, Rfl: 0 .  benzonatate (TESSALON) 100 MG capsule, TK 1 C PO TID PRN, Disp: , Rfl: 2 .  buPROPion (WELLBUTRIN XL) 150 MG 24 hr tablet, TK 1 T PO QD, Disp: , Rfl: 5 .  Calcium Carb-Cholecalciferol (CALCIUM + D3 PO), Take 1 tablet by mouth 3 (three) times daily., Disp: , Rfl:  .  cephALEXin (KEFLEX) 500 MG capsule, TK ONE C PO TID FOR 10 DAYS, Disp: , Rfl: 0 .  clopidogrel (PLAVIX) 75 MG tablet, Take 1 tablet (75 mg total) by mouth daily., Disp: 90 tablet, Rfl: 3 .  diphenhydrAMINE (BENADRYL) 25 mg capsule, Take 25 mg by mouth 2 (two) times daily as needed for allergies. , Disp: , Rfl:  .  FLOVENT HFA 220 MCG/ACT inhaler, INL 2 PFS PO BID, Disp: , Rfl: 5 .  fluconazole (DIFLUCAN) 100 MG tablet, Take 1 tablet (100 mg total) daily by mouth., Disp: 7 tablet, Rfl: 3 .  furosemide (LASIX) 20 MG tablet, Take 20 mg daily by mouth., Disp: , Rfl: 1 .  lidocaine-prilocaine (EMLA) cream, Apply to affected area once (Patient taking differently: Apply 1 application topically See admin instructions. TO AFFECTED AREA AS DIRECTED), Disp: 30 g, Rfl: 3 .  lisinopril (PRINIVIL,ZESTRIL) 5 MG tablet, Take 1 tablet (5 mg total) by mouth daily., Disp:  90 tablet, Rfl: 1 .  magnesium oxide (MAG-OX) 400 (241.3 Mg) MG tablet, Take 1 tablet (400 mg total) by mouth daily., Disp: 60 tablet, Rfl: 1 .  metoprolol tartrate (LOPRESSOR) 25 MG tablet, TAKE 1 TABLET BY MOUTH TWICE DAILY, Disp: 180 tablet, Rfl: 3 .  nystatin (MYCOSTATIN) 100000 UNIT/ML suspension, Take 5 mLs (500,000 Units total) by mouth 4 (four) times daily. Swish and spit., Disp: 60 mL, Rfl: 0 .  ondansetron (ZOFRAN) 8 MG tablet, Take 1 tablet (8 mg total) by mouth every 8 (eight) hours as needed for nausea or vomiting., Disp: 30 tablet, Rfl: 0 .  oxyCODONE (OXY IR/ROXICODONE) 5 MG immediate release tablet, Take 1 tablet (5 mg total) by mouth every 4 (four) hours as needed for severe pain., Disp: 30 tablet, Rfl: 0 .  oxyCODONE (OXYCONTIN) 20 mg 12 hr tablet, Take 1 tablet (20 mg total) by mouth every 12 (twelve) hours., Disp: 60 tablet, Rfl: 0 .  pantoprazole (PROTONIX) 40 MG tablet, TK 1 T PO QD, Disp: , Rfl: 5 .  potassium chloride (K-DUR) 10 MEQ tablet, Take 1 tablet (10 mEq total) by mouth daily., Disp: 60 tablet, Rfl: 1 .  PROAIR HFA 108 (90 Base) MCG/ACT inhaler, INL 2 PFS PO Q 4 TO 6 H PRN, Disp: , Rfl: 5 .  promethazine (PHENERGAN) 25 MG suppository, Place 1 suppository (25 mg total) rectally every 6 (six) hours as needed for nausea or vomiting., Disp: 12 each, Rfl: 5 .  STIOLTO RESPIMAT 2.5-2.5 MCG/ACT AERS, INL 2 PFS PO QD, Disp: , Rfl: 5 .  sucralfate (CARAFATE) 1 g tablet, Take 1 tablet (1 g total) by mouth 3 (three) times daily. Dissolve in 2-3 tbsp warm water, swish and swallow, Disp: 90 tablet, Rfl: 3 .  Vitamin D, Ergocalciferol, (DRISDOL) 50000 units CAPS capsule, TK ONE C PO Q WEEK, Disp: , Rfl: 5 No current facility-administered medications for this visit.   Facility-Administered Medications Ordered in Other Visits:  .  heparin lock flush 100 unit/mL, 500 Units, Intravenous, Once, Lequita Asal, MD  Physical exam:  Vitals:   04/17/17 1414  BP: 128/88  Pulse: 87   Resp: 18  Temp: 98.2 F (36.8 C)  TempSrc: Tympanic  Weight: 131 lb 12.8 oz (59.8 kg)    GENERAL:  Thin, frail gentleman sitting comfortably in exam room, NAD. Accompanied by wife, Mariann Laster.  MENTAL STATUS: Alert and oriented to  person, place and time. HEAD:Alopecia. Atraumatic. Poor dentition. PERRL. No conjunctivitis or scleral icterus.  RESPIRATORY:Clear to auscultationwithout rales, wheezes or rhonchi. CARDIOVASCULAR:Regular rate andrhythmwithout murmur, rub or gallop. ABDOMEN:Soft, non-tender, with active bowel sounds SKIN: No rashes. Reddened area left elbow approx 2-3 cm, fluctuant, tender, white pustule centrally MSK: TTP mid-thoracic spine, ambulatory w/ cane. EXTREMITIES: No edema, no skin discoloration or tenderness.  LYMPHNODES: No palpable cervical, supraclavicular adenopathy  NEUROLOGICAL: Unremarkable. PSYCH: Appropriate   CMP Latest Ref Rng & Units 04/17/2017  Glucose 65 - 99 mg/dL 109(H)  BUN 6 - 20 mg/dL 6  Creatinine 0.61 - 1.24 mg/dL 0.72  Sodium 135 - 145 mmol/L 134(L)  Potassium 3.5 - 5.1 mmol/L 4.1  Chloride 101 - 111 mmol/L 102  CO2 22 - 32 mmol/L 25  Calcium 8.9 - 10.3 mg/dL 8.3(L)  Total Protein 6.5 - 8.1 g/dL -  Total Bilirubin 0.3 - 1.2 mg/dL -  Alkaline Phos 38 - 126 U/L -  AST 15 - 41 U/L -  ALT 17 - 63 U/L -   CBC Latest Ref Rng & Units 04/11/2017  WBC 3.8 - 10.6 K/uL 4.3  Hemoglobin 13.0 - 18.0 g/dL 10.2(L)  Hematocrit 40.0 - 52.0 % 29.5(L)  Platelets 150 - 440 K/uL 257      Nm Bone Scan Whole Body  Result Date: 04/10/2017 CLINICAL DATA:  Small cell lung cancer. EXAM: NUCLEAR MEDICINE WHOLE BODY BONE SCAN TECHNIQUE: Whole body anterior and posterior images were obtained approximately 3 hours after intravenous injection of radiopharmaceutical. RADIOPHARMACEUTICALS:  22.0 mCi Technetium-22m MDP IV COMPARISON:  CT 03/18/2017, PET-CT 11/12/2016 FINDINGS: Multiple foci of uptake within the posterior and anterior LEFT RIGHT ribs  consists with bone metastasis. Broad lesion in the LEFT iliac bone medially. Lesion in the distal aspect of the RIGHT clavicle. These lesions are have corresponding sclerotic lesions on comparison CT 03/18/2017. Of note lesions are not hypermetabolic on PET-CT scan of 11/12/2016. IMPRESSION: 1. Multiple foci of skeletal metastasis within the ribs, pelvis, and RIGHT clavicle. Electronically Signed   By: Suzy Bouchard M.D.   On: 04/10/2017 16:57     Assessment and plan- Patient is a 58 y.o. male with extensive stage small cell lung cancer s/p carbo etoposide with neulasta (10/02/16) with marked response to therapy. He received XRT to thoracic spine (01/11/17) and PCI (02/15/17). He presents to Symptom Management Clinic requesting evaluation of pain and refills of pain medications.   1. Chronic pain- complaints of back pain at site of sclerotic lesion s/p XRT. Previously evaluated and treated by Dr. Meriel Pica at [facility]. Previous hx of increasing doses w/o advisement. Honor Loh, NP expressed concerns of 2 prescriptions that patient had received and had not yet filled based on his review of Controlled Substance Registry. Previous UDS negative for opiates, positive for cannabinoids. UDS repeated today- negative for all substances. Serum confirmation testing pending at this time.   Consulted with primary med-onc team who requested no additional prescriptions be given at this time secondary to concerns for diversion. Med-Onc team confirmed appropriate testing and confirmations had been ordered. Personally reviewed [place].   Last urine toxicology: 04/17/16- negative for all substances Last Opioid Increase: 03/11/17- oxycontin er 10mg  tab BID to oxycontin ER 20mg  tab BID Previous compliance concerns: yes- see above Naloxone ordered: n/a Current MME/day: 60 Sedatives: lorazepam 0.5 mg tab; last filled 10/23/16  Discussed risks of chronic opiate therapy. Results discussed with patient who then became angry and yelling.  Discussed role of confirmation testing and timeline  for results. Discussed that med-onc team was consulted and requested no additional prescriptions to be given to patient at this time and recommendation was made to send referral to pain management. Wife shoved chair, cursed me, and walked out of room, continuing to yell and curse in hall.  Discussed with Med-Onc team who confirmed plan of care and will follow-up. Referral to pain management sent. According to chart, patient has not seen Dr. Meriel Pica at [facility] since 2017. Patient had expressed concerns regarding transportation. Will send local referral.    2. Lung Cancer- most recent bone scan shows multiple areas of sclerotic lesions within ribs, pelvis, and right clavicle.  Currently on surveillance. S/p carbo-etoposide. CT 03/18/17 showed stable osseous metastatic disease with new irregular ground-glass areas in RUL, RLL, lingula, and LLL with suspicion for infection or inflammation. PET scan scheduled for 04/19/16 and patient to follow up with primary med-onc team at that time for discussion of results.    3. Skin infection of left arm-2-3 cm reddened lesion of left elbow with fluctuant head. On antibiotics. Prior hx of MRSA w/ I&D. Based on hx, suspect colonization. Continue antibiotics per PCP. Will write for topical bacitracin ointment which he can apply to area BID. Advised patient to follow-up with PCP if symptoms have not begun to improve in next 2-3 days.     Visit Diagnosis 1. Chronic pain syndrome   2. Small cell lung cancer ([place])   3. Cutaneous abscess of left upper extremity    A total of (30) minutes of face-to-face time was spent with this patient with greater than 50% of that time in counseling and care-coordination.    Beckey Rutter, DNP, AGNP-C [facility] [phone] (office) 04/17/17 6:21 PM

## 2017-04-17 NOTE — Telephone Encounter (Signed)
Patient called requesting refill of his Oxy IR today and Oxy ER for Friday. He did not show up for his appointment Monday to see NP. Per Lauren NP, he must be seen prior to refill of his medications and needs labs drawn first. Returned call to patient to set up appointment, but got his voice mail and I left a message to return my call to set up appointment before we can refill his meds

## 2017-04-17 NOTE — Telephone Encounter (Signed)
Patient called back and accepted an appointment for 130 today

## 2017-04-17 NOTE — Progress Notes (Signed)
Patient here today as acute add on to the symptom management clinic. He is requesting refills of his pain medication. He states that his back hurts all the time, and is complaining of 6 out of 10 pain right now. He reports being started on an antibiotic by his PMD on Monday, for his right elbow, which is red and swollen with a pustule.

## 2017-04-19 ENCOUNTER — Other Ambulatory Visit: Payer: Self-pay | Admitting: *Deleted

## 2017-04-19 ENCOUNTER — Ambulatory Visit: Payer: Medicaid Other

## 2017-04-22 LAB — THC,MS,WB/SP RFX
CANNABINOID CONFIRMATION: POSITIVE
CANNABINOL: NEGATIVE ng/mL
Cannabidiol: NEGATIVE ng/mL
Carboxy-THC: 27.1 ng/mL
Hydroxy-THC: 1 ng/mL
TETRAHYDROCANNABINOL(THC): NEGATIVE ng/mL

## 2017-04-22 NOTE — Progress Notes (Deleted)
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 Nausea without vomiting 01/20/2017  . Hypokalemia 01/20/2017  . Dehydration 01/20/2017  . Skin abscess 04/17/2017  . Lung cancer metastatic to bone ([place]) 04/24/2017  . 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 Marijuana user 04/24/2017  . Abnormal drug screen 04/24/2017   Resolved Ambulatory Problems    Diagnosis Date Noted  . Elevated blood pressure 07/06/2014  . Coronary artery disease involving native coronary artery of native heart with unstable angina pectoris ([place] ) 08/24/2014  . Sinusitis, acute 09/14/2014  . Maculopapular rash 09/14/2014  . Acute MI, inferior wall, initial episode of care ([place]) 10/08/2014  . Seizure ([place]) 07/06/2014  . CAD in native artery 08/24/2014  . Atherosclerosis of coronary artery 08/24/2014  . Elevated  blood-pressure reading without diagnosis of hypertension 07/06/2014  . Encounter for general adult medical examination without abnormal findings 07/06/2014  . Compulsive tobacco user syndrome 07/06/2014  . Hypertensive  heart disease    Past Medical History:  Diagnosis Date  . Anemia   . CAD (coronary artery disease)   . DVT, recurrent, lower extremity, acute, left ([place]) 2016  . Hyperlipidemia   . Hypertension   . Hypertensive heart disease   . Lung cancer ([place])   . PAD (peripheral artery disease) ([place])   . Tobacco abuse   . 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 In no apparent acute distress There were no vitals filed for this visit. BMI Assessment: Estimated body mass index is 18.38 kg/m as calculated from the following:   Height as of 01/14/17: '5\' 11"'  (1.803 m).   Weight as of 04/17/17: 131 lb 12.8 oz (59.8 kg).  BMI interpretation table: BMI level Category Range association with higher incidence of chronic pain  <18 kg/m2 Underweight   18.5-24.9 kg/m2 Ideal body weight   25-29.9 kg/m2 Overweight Increased incidence by 20%  30-34.9 kg/m2 Obese (Class I) Increased incidence by 68%  35-39.9 kg/m2 Severe obesity (Class II) Increased incidence by 136%  >40 kg/m2 Extreme obesity (Class III) Increased incidence by 254%   BMI Readings from Last 4 Encounters:  04/17/17 18.38 kg/m  04/11/17 18.51 kg/m  04/11/17 18.54 kg/m  03/21/17 19.65 kg/m   Wt Readings from Last 4 Encounters:  04/17/17 131 lb 12.8 oz (59.8 kg)  04/11/17 132 lb 11.5 oz (60.2 kg)  04/11/17 132 lb 14.4 oz (60.3 kg)  03/21/17 140 lb 14.4 oz (63.9 kg)  Psych/Mental status: Alert, oriented x 3 (person, place, & time)       Eyes: PERLA Respiratory: No evidence of acute respiratory distress  Cervical Spine Area Exam  Skin & Axial Inspection: No masses, redness, edema, swelling, or associated skin lesions Alignment: Symmetrical Functional ROM: Unrestricted ROM      Stability: No instability detected Muscle Tone/Strength: Functionally intact. No obvious neuro-muscular anomalies detected. Sensory (Neurological):  Unimpaired Palpation: No palpable anomalies              Upper Extremity (UE) Exam    Side: Right upper extremity  Side: Left upper extremity  Skin & Extremity Inspection: Skin color, temperature, and hair growth are WNL. No peripheral edema or cyanosis. No masses, redness, swelling, asymmetry, or associated skin lesions. No contractures.  Skin & Extremity Inspection: Skin color, temperature, and hair growth are WNL. No peripheral edema or cyanosis. No masses, redness, swelling, asymmetry, or associated skin lesions. No contractures.  Functional ROM: Unrestricted ROM          Functional ROM: Unrestricted ROM          Muscle Tone/Strength: Functionally intact. No obvious neuro-muscular anomalies detected.  Muscle Tone/Strength: Functionally intact. No obvious neuro-muscular anomalies detected.  Sensory (Neurological): Unimpaired          Sensory (Neurological): Unimpaired          Palpation: No palpable anomalies              Palpation: No palpable anomalies              Specialized Test(s): Deferred         Specialized Test(s): Deferred          Thoracic Spine Area Exam  Skin & Axial Inspection: No masses,  redness, or swelling Alignment: Symmetrical Functional ROM: Unrestricted ROM Stability: No instability detected Muscle Tone/Strength: Functionally intact. No obvious neuro-muscular anomalies detected. Sensory (Neurological): Unimpaired Muscle strength & Tone: No palpable anomalies  Lumbar Spine Area Exam  Skin & Axial Inspection: No masses, redness, or swelling Alignment: Symmetrical Functional ROM: Unrestricted ROM      Stability: No instability detected Muscle Tone/Strength: Functionally intact. No obvious neuro-muscular anomalies detected. Sensory (Neurological): Unimpaired Palpation: No palpable anomalies       Provocative Tests: Lumbar Hyperextension and rotation test: evaluation deferred today       Lumbar Lateral bending test: evaluation deferred today       Patrick's  Maneuver: evaluation deferred today                    Gait & Posture Assessment  Ambulation: Unassisted Gait: Relatively normal for age and body habitus Posture: WNL   Lower Extremity Exam    Side: Right lower extremity  Side: Left lower extremity  Skin & Extremity Inspection: Skin color, temperature, and hair growth are WNL. No peripheral edema or cyanosis. No masses, redness, swelling, asymmetry, or associated skin lesions. No contractures.  Skin & Extremity Inspection: Skin color, temperature, and hair growth are WNL. No peripheral edema or cyanosis. No masses, redness, swelling, asymmetry, or associated skin lesions. No contractures.  Functional ROM: Unrestricted ROM          Functional ROM: Unrestricted ROM          Muscle Tone/Strength: Functionally intact. No obvious neuro-muscular anomalies detected.  Muscle Tone/Strength: Functionally intact. No obvious neuro-muscular anomalies detected.  Sensory (Neurological): Unimpaired  Sensory (Neurological): Unimpaired  Palpation: No palpable anomalies  Palpation: No palpable anomalies   Assessment  Primary Diagnosis & Pertinent Problem List: The primary encounter diagnosis was Chronic pain syndrome. Diagnoses of Cancer related pain, Lung cancer metastatic to bone [facility]), Cancer of upper lobe of left lung ([place]), Small cell lung cancer ([place]), Bone metastasis ([place]), Chronic foot pain (Left), Charcot's joint of foot (Right), Thoracic spine tumor, Chronic thoracic spine pain, Marijuana user, and Abnormal drug screen were also pertinent to this visit.  Visit Diagnosis (New problems to examiner): 1. Chronic pain syndrome   2. Cancer related pain   3. Lung cancer metastatic to bone ([place])   4. Cancer of upper lobe of left lung ([place])   5. Small cell lung cancer ([place])   6. Bone metastasis ([place])   7. Chronic foot pain (Left)   8. Charcot's joint of foot (Right)   9. Thoracic spine tumor   10. Chronic thoracic spine pain   11. Marijuana user   12.  Abnormal drug screen    Plan of Care (Initial workup plan)  Note: Mr. Finigan was reminded that as per protocol, today's visit has been an evaluation only. We have not taken over the patient's controlled substance management.  Problem-specific plan: No problem-specific Assessment & Plan notes found for this encounter.  Lab Orders  No laboratory test(s) ordered today   Imaging Orders  No imaging studies ordered today   Referral Orders  No referral(s) requested today   Procedure Orders    No procedure(s) ordered today   Pharmacotherapy (current): Medications ordered:  No orders of the defined types were placed in this encounter.  Medications administered during this visit: Mathan Darroch. Larsh had no medications administered during this visit.   Pharmacological management options:  Opioid Analgesics: The patient was informed that there is no guarantee that he would  be a candidate for opioid analgesics. The decision will be made following CDC guidelines. This decision will be based on the results of diagnostic studies, as well as Mr. Wilczynski risk profile.   Membrane stabilizer: To be determined at a later time  Muscle relaxant: To be determined at a later time  NSAID: To be determined at a later time  Other analgesic(s): To be determined at a later time   Interventional management options: Mr. Stencil was informed that there is no guarantee that he would be a candidate for interventional therapies. The decision will be based on the results of diagnostic studies, as well as Mr. Gram risk profile.  Procedure(s) under consideration:  ***   Provider-requested follow-up: No Follow-up on file.  Future Appointments  Date Time Provider [place]  04/24/2017  9:45 AM Milinda Pointer, MD [facility] None  04/25/2017  9:15 AM [facility] None  04/25/2017  9:45 AM Lequita Asal, MD [facility] None  04/25/2017 11:15 AM Jennet Maduro, RD [facility] None  04/26/2017  1:30  PM [facility]  04/30/2017  9:30 AM Theora Gianotti, NP [id number]  05/22/2017  2:00 PM [facility] None    Primary Care Physician: Lorelee Market, MD Location: [facility] Note by: Gaspar Cola, MD Date: 04/24/2017; Time: 7:31 AM

## 2017-04-23 ENCOUNTER — Other Ambulatory Visit: Payer: Self-pay | Admitting: *Deleted

## 2017-04-23 LAB — DRUG SCREEN 10 W/CONF, SERUM
Amphetamines, IA: NEGATIVE ng/mL
Barbiturates, IA: NEGATIVE ug/mL
Benzodiazepines, IA: NEGATIVE ng/mL
COCAINE & METABOLITE, IA: NEGATIVE ng/mL
Methadone, IA: NEGATIVE ng/mL
OXYCODONES, IA: POSITIVE ng/mL
Opiates, IA: NEGATIVE ng/mL
Phencyclidine, IA: NEGATIVE ng/mL
Propoxyphene, IA: NEGATIVE ng/mL
THC(Marijuana) Metabolite, IA: POSITIVE ng/mL

## 2017-04-23 LAB — OXYCODONES,MS,WB/SP RFX
OXYCOCONE: 25.7 ng/mL
OXYCODONES CONFIRMATION: POSITIVE
Oxymorphone: NEGATIVE ng/mL

## 2017-04-24 ENCOUNTER — Encounter: Payer: Medicaid Other | Admitting: Pain Medicine

## 2017-04-24 ENCOUNTER — Other Ambulatory Visit: Payer: Self-pay | Admitting: *Deleted

## 2017-04-24 DIAGNOSIS — C3490 Malignant neoplasm of unspecified part of unspecified bronchus or lung: Secondary | ICD-10-CM

## 2017-04-24 DIAGNOSIS — R892 Abnormal level of other drugs, medicaments and biological substances in specimens from other organs, systems and tissues: Secondary | ICD-10-CM | POA: Insufficient documentation

## 2017-04-24 DIAGNOSIS — D492 Neoplasm of unspecified behavior of bone, soft tissue, and skin: Secondary | ICD-10-CM | POA: Insufficient documentation

## 2017-04-24 DIAGNOSIS — C7951 Secondary malignant neoplasm of bone: Secondary | ICD-10-CM

## 2017-04-24 DIAGNOSIS — F1290 Cannabis use, unspecified, uncomplicated: Secondary | ICD-10-CM | POA: Insufficient documentation

## 2017-04-25 ENCOUNTER — Ambulatory Visit: Payer: Medicaid Other

## 2017-04-25 ENCOUNTER — Inpatient Hospital Stay: Payer: Medicaid Other | Admitting: Hematology and Oncology

## 2017-04-25 ENCOUNTER — Inpatient Hospital Stay: Payer: Medicaid Other

## 2017-04-25 NOTE — Progress Notes (Deleted)
[facility] day:  04/25/2017   Chief Complaint: Philip [name] is a 57 y.o. male with extensive stage small cell lung cancer who is seen for review of interval PET scan.  HPI:  The patient was last seen in the medical oncology clinic on 04/11/2017.  At that time, he felt "weak".  He had continued nausea.  He had lost weight.  He had significant back pain despite radiation.  He is taking Oxycontin 20 mg BID and 4 oxycodone/day.  WBC was 4300 with an ANC of 3000. Hemoglobin was 10.2.  Potassium was 3.5. Sodium waslow at 133. Magnesium was 1.4.  He was scheduled for PET scan as prior imaging (CT and bone scan) revealed no obvious progressive disease.  He received IV magnesium.  He saw nutrition.  Urine drug screen was negative.  He saw Beckey Rutter on 04/17/2017.  He complained of back pain 6 out of 10.  He had an abscess on his right elbow for which he was receiving cephalexin through his PCP.  Urine drug screen was negative despite patient's report of taking oxycodone and Oxycontin.  Blood testing for narcotics was positive for oxycodone (25.7 ng/ml).  PET scan is scheduled for 04/25/2017.   Past Medical History:  Diagnosis Date  . Anemia   . CAD (coronary artery disease)    a. 08/2014 Inf STEMI/CABG x 3 (LIMA->LAD, VG->Diag, RIMA->RCA).  . DVT, recurrent, lower extremity, acute, left ([place]) 2016  . Hyperlipidemia   . Hypertension   . Hypertensive heart disease   . Lung cancer ([place])   . PAD (peripheral artery disease) ([place])    a. 03/2015 Periph Angio: short occlusion of L pop w/ evidence of embolization into the DP->5.0x50 mm Inova self-expanding stent;  b. 04/08/2015 ABI: R 1.12, L 0.99.  . Tobacco abuse   . Toe amputation status, left [facility]) 07/09/2016   2017    Past Surgical History:  Procedure Laterality Date  . CARDIAC CATHETERIZATION N/A 08/24/2014   Procedure: Left Heart Cath and Coronary Angiography;  Surgeon: Isaias Cowman,  MD;  Location: [place] [facility];  Service: Cardiovascular;  Laterality: N/A;  . CARDIAC CATHETERIZATION N/A 08/24/2014   Procedure: Coronary Stent Intervention;  Surgeon: Isaias Cowman, MD;  Location: [place] [facility];  Service: Cardiovascular;  Laterality: N/A;  . CORONARY ARTERY BYPASS GRAFT N/A 08/25/2014   Procedure: CORONARY ARTERY BYPASS GRAFTING (CABG), ON PUMP, TIMES THREE, USING BILATERAL MAMMARY ARTERIES, RIGHT GREATER SAPHENOUS VEIN HARVESTED ENDOSCOPICALLY;  Surgeon: Melrose Nakayama, MD;  Location: [place];  Service: Open Heart Surgery;  Laterality: N/A;  . INCISION AND DRAINAGE ABSCESS Left 11/15/2016   Procedure: INCISION AND DRAINAGE ABSCESS OF LEFT WRIST;  Surgeon: Roseanne Kaufman, MD;  Location: [place];  Service: Orthopedics;  Laterality: Left;  . PERIPHERAL VASCULAR CATHETERIZATION N/A 03/30/2015   Procedure: Abdominal Aortogram w/Lower Extremity;  Surgeon: Wellington Hampshire, MD;  Location: [place] [facility];  Service: Cardiovascular;  Laterality: N/A;  . PORTA CATH INSERTION N/A 07/27/2016   Procedure: Glori Luis Cath Insertion;  Surgeon: Algernon Huxley, MD;  Location: [place] [facility];  Service: Cardiovascular;  Laterality: N/A;  . TEE WITHOUT CARDIOVERSION N/A 08/25/2014   Procedure: TRANSESOPHAGEAL ECHOCARDIOGRAM (TEE);  Surgeon: Melrose Nakayama, MD;  Location: [place];  Service: Open Heart Surgery;  Laterality: N/A;    Family History  Problem Relation Age of Onset  . Hypertension Father   . Lung cancer Father   .  Heart attack Mother 19       STENT  . Hyperlipidemia Mother   . Heart attack Brother   . Heart attack Brother   . Breast cancer Sister   . Heart attack Sister   . Colon cancer Paternal Grandfather     Social History:  reports that he has been smoking cigarettes.  He has been smoking about 0.50 packs per day. he has never used smokeless tobacco. He reports that he drinks alcohol. He reports that he does not use drugs.  The patient smokes 1/2 pack  of cigarettes/day (previously 2 1/2 ppd).  He started smoking in his teens.  He denies any exposure to radiation or toxins.  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  He lives in [place].  His wife is named Publishing copy.  He is accompanied by his wife today.  Allergies:  Allergies  Allergen Reactions  . Amitriptyline Other (See Comments)    Burning sensation all over  . Contrast Media [Iodinated Diagnostic Agents] Other (See Comments)    Pt states that he got "knots behind his ears"  . Nsaids Other (See Comments)    Blood in stools  . Ibuprofen Other (See Comments) and Nausea Only    Blood in stools  . Tape Rash and Other (See Comments)    PLEASE USE COBAN WRAP; THE PATIENT'S SKIN TEARS WHEN BANDAGES ARE REMOVED!!    Current Medications: Current Outpatient Medications  Medication Sig Dispense Refill  . atorvastatin (LIPITOR) 40 MG tablet TAKE 1 TABLET BY MOUTH DAILY 90 tablet 3  . bacitracin 500 UNIT/GM ointment Apply 1 application topically 2 (two) times daily. 15 g 0  . benzonatate (TESSALON) 100 MG capsule TK 1 C PO TID PRN  2  . buPROPion (WELLBUTRIN XL) 150 MG 24 hr tablet TK 1 T PO QD  5  . Calcium Carb-Cholecalciferol (CALCIUM + D3 PO) Take 1 tablet by mouth 3 (three) times daily.    . cephALEXin (KEFLEX) 500 MG capsule TK ONE C PO TID FOR 10 DAYS  0  . clopidogrel (PLAVIX) 75 MG tablet Take 1 tablet (75 mg total) by mouth daily. 90 tablet 3  . diphenhydrAMINE (BENADRYL) 25 mg capsule Take 25 mg by mouth 2 (two) times daily as needed for allergies.     Marland Kitchen FLOVENT HFA 220 MCG/ACT inhaler INL 2 PFS PO BID  5  . fluconazole (DIFLUCAN) 100 MG tablet Take 1 tablet (100 mg total) daily by mouth. 7 tablet 3  . furosemide (LASIX) 20 MG tablet Take 20 mg daily by mouth.  1  . lidocaine-prilocaine (EMLA) cream Apply to affected area once (Patient taking differently: Apply 1 application topically See admin instructions. TO AFFECTED AREA AS DIRECTED) 30 g 3  . lisinopril  (PRINIVIL,ZESTRIL) 5 MG tablet Take 1 tablet (5 mg total) by mouth daily. 90 tablet 1  . magnesium oxide (MAG-OX) 400 (241.3 Mg) MG tablet Take 1 tablet (400 mg total) by mouth daily. 60 tablet 1  . metoprolol tartrate (LOPRESSOR) 25 MG tablet TAKE 1 TABLET BY MOUTH TWICE DAILY 180 tablet 3  . nystatin (MYCOSTATIN) 100000 UNIT/ML suspension Take 5 mLs (500,000 Units total) by mouth 4 (four) times daily. Swish and spit. 60 mL 0  . ondansetron (ZOFRAN) 8 MG tablet Take 1 tablet (8 mg total) by mouth every 8 (eight) hours as needed for nausea or vomiting. 30 tablet 0  . oxyCODONE (OXY IR/ROXICODONE) 5 MG immediate release tablet Take 1  tablet (5 mg total) by mouth every 4 (four) hours as needed for severe pain. 30 tablet 0  . oxyCODONE (OXYCONTIN) 20 mg 12 hr tablet Take 1 tablet (20 mg total) by mouth every 12 (twelve) hours. 60 tablet 0  . pantoprazole (PROTONIX) 40 MG tablet TK 1 T PO QD  5  . potassium chloride (K-DUR) 10 MEQ tablet Take 1 tablet (10 mEq total) by mouth daily. 60 tablet 1  . PROAIR HFA 108 (90 Base) MCG/ACT inhaler INL 2 PFS PO Q 4 TO 6 H PRN  5  . promethazine (PHENERGAN) 25 MG suppository Place 1 suppository (25 mg total) rectally every 6 (six) hours as needed for nausea or vomiting. 12 each 5  . STIOLTO RESPIMAT 2.5-2.5 MCG/ACT AERS INL 2 PFS PO QD  5  . sucralfate (CARAFATE) 1 g tablet Take 1 tablet (1 g total) by mouth 3 (three) times daily. Dissolve in 2-3 tbsp warm water, swish and swallow 90 tablet 3  . Vitamin D, Ergocalciferol, (DRISDOL) 50000 units CAPS capsule TK ONE C PO Q WEEK  5   No current facility-administered medications for this visit.    Facility-Administered Medications Ordered in Other Visits  Medication Dose Route Frequency Provider Last Rate Last Dose  . heparin lock flush 100 unit/mL  500 Units Intravenous Once Lequita Asal, MD        Review of Systems:  GENERAL:  Feels "weak".  Fatigued.  No fevers or sweats.  Weight down 8 pounds.   PERFORMANCE STATUS (ECOG):  1 HEENT:  No visual changes, runny nose, sore throat, mouth sores or tenderness. Lungs: No shortness of breath or cough.  No hemoptysis. Cardiac:  No chest pain, palpitations, orthopnea, or PND. GI:  Appetite decreased.  Sick every day.  No diarrhea, constipation, melena or hematochezia.   GU:  No urgency, frequency, dysuria, or hematuria. Musculoskeletal:  Low back pain (chronic).  Upper back pain.  No joint pain.  No muscle tenderness. Extremities:  Legs weak.  No pain or swelling. Skin:  No rashes or skin changes. Neuro:  No headache, numbness or weakness, balance or coordination issues. Endocrine:  No diabetes, thyroid issues, hot flashes or night sweats. Psych:  No mood changes, depression or anxiety. Pain:  Chronic back pain 6 out of 10. Review of systems:  All other systems reviewed and found to be negative.  Physical Exam: There were no vitals taken for this visit. GENERAL:  Thin gentleman sitting comfortably in the exam room in no acute distress.  He lloks to his wife when answering questions about his pain. MENTAL STATUS: Alert and oriented to person, place and time. HEAD:Alopecia.  Normocephalic, atraumatic, face symmetric.  Cushingoid features. EYES:B[place]neyes. Pupils equal round and reactive to light and accomodation. No conjunctivitis or scleral icterus. ENT:No thrush.  Oropharynx clear without lesion. Tonguenormal. Poor dentition. Mucous membranes moist. RESPIRATORY:Clear to auscultationwithout rales, wheezes or rhonchi. CARDIOVASCULAR:Regular rate andrhythmwithout murmur, rub or gallop. ABDOMEN:Soft, non-tender, with active bowel sounds, and no hepatosplenomegaly. No masses. BACK:  Pain on palpation cervical and mid thoracic spine. SKIN: No rashes or ulcers. EXTREMITIES: No lower extremity edema, no skin discoloration or tenderness. No palpable cords. LYMPHNODES: No palpable cervical, supraclavicular, axillary or  inguinal adenopathy  NEUROLOGICAL: Unremarkable. PSYCH: Appropriate.   No visits with results within 3 Day(s) from this visit.  Latest known visit with results is:  Clinical Support on 04/17/2017  Component Date Value Ref Range Status  . Magnesium 04/17/2017 1.4* 1.7 - 2.4 mg/dL  Final   Performed at [facility], [address], [place], [place] [address]  . Amphetamines, IA 04/17/2017 Negative  Cutoff:50 ng/mL Final  . Barbiturates, IA 04/17/2017 Negative  Cutoff:0.1 ug/mL Final  . Benzodiazepines, IA 04/17/2017 Negative  Cutoff:20 ng/mL Final  . Cocaine & Metabolite, IA 04/17/2017 Negative  Cutoff:25 ng/mL Final  . Phencyclidine, IA 04/17/2017 Negative  Cutoff:8 ng/mL Final  . THC(Marijuana) Metabolite, IA 04/17/2017 ++POSITIVE++  Cutoff:5 ng/mL Final  . Opiates, IA 04/17/2017 Negative  Cutoff:5 ng/mL Final  . Oxycodones, IA 04/17/2017 ++POSITIVE++  Cutoff:5 ng/mL Final  . Methadone, IA 04/17/2017 Negative  Cutoff:25 ng/mL Final  . Propoxyphene, IA 04/17/2017 Negative  Cutoff:50 ng/mL Final   Comment: (NOTE) This test was developed and its performance characteristics determined by [facility].  It has not been cleared or approved by the Food and Drug Administration. Performed At: [facility] [place], [place] [id number] Collier Salina HC:[id number] Performed at [facility], [address], [place], [place] [address]   . Tricyclic, Ur Screen 07/37/1062 NONE DETECTED  NONE DETECTED Final  . Amphetamines, Ur Screen 04/17/2017 NONE DETECTED  NONE DETECTED Final  . MDMA (Ecstasy)Ur Screen 04/17/2017 NONE DETECTED  NONE DETECTED Final  . Cocaine Metabolite,Ur [place] 04/17/2017 NONE DETECTED  NONE DETECTED Final  . Opiate, Ur Screen 04/17/2017 NONE DETECTED  NONE DETECTED Final  . Phencyclidine (PCP) Ur S 04/17/2017 NONE DETECTED  NONE DETECTED Final  . Cannabinoid 50 Ng, Ur  04/17/2017 NONE DETECTED  NONE DETECTED Final  . Barbiturates, Ur  Screen 04/17/2017 NONE DETECTED  NONE DETECTED Final  . Benzodiazepine, Ur Scrn 04/17/2017 NONE DETECTED  NONE DETECTED Final  . Methadone Scn, Ur 04/17/2017 NONE DETECTED  NONE DETECTED Final   Comment: (NOTE) Tricyclics + metabolites, urine    Cutoff 1000 ng/mL Amphetamines + metabolites, urine  Cutoff 1000 ng/mL MDMA (Ecstasy), urine              Cutoff 500 ng/mL Cocaine Metabolite, urine          Cutoff 300 ng/mL Opiate + metabolites, urine        Cutoff 300 ng/mL Phencyclidine (PCP), urine         Cutoff 25 ng/mL Cannabinoid, urine                 Cutoff 50 ng/mL Barbiturates + metabolites, urine  Cutoff 200 ng/mL Benzodiazepine, urine              Cutoff 200 ng/mL Methadone, urine                   Cutoff 300 ng/mL The urine drug screen provides only a preliminary, unconfirmed analytical test result and should not be used for non-medical purposes. Clinical consideration and professional judgment should be applied to any positive drug screen result due to possible interfering substances. A more specific alternate chemical method must be used in order to obtain a confirmed analytical result. Gas chromatography / mass spectrometry (GC/MS) is the preferred confirmat                          ory method. Performed at [facility], [address], [place], [place] [address]   . Sodium 04/17/2017 134* 135 - 145 mmol/L Final  . Potassium 04/17/2017 4.1  3.5 - 5.1 mmol/L Final  . Chloride 04/17/2017 102  101 - 111 mmol/L Final  . CO2 04/17/2017  25  22 - 32 mmol/L Final  . Glucose, Bld 04/17/2017 109* 65 - 99 mg/dL Final  . BUN 04/17/2017 6  6 - 20 mg/dL Final  . Creatinine, Ser 04/17/2017 0.72  0.61 - 1.24 mg/dL Final  . Calcium 04/17/2017 8.3* 8.9 - 10.3 mg/dL Final  . GFR calc non Af Amer 04/17/2017 >60  >60 mL/min Final  . GFR calc Af Amer 04/17/2017 >60  >60 mL/min Final   Comment: (NOTE) The eGFR has been calculated using the CKD EPI equation. This calculation has not  been validated in all clinical situations. eGFR's persistently <60 mL/min signify possible Chronic Kidney Disease.   Georgiann Hahn gap 04/17/2017 7  5 - 15 Final   Performed at [facility], [address] [place], [place] [address]  . Cannabinoid Confirmation 04/17/2017 Positive   Final  . Tetrahydrocannabinol(THC) 04/17/2017 Negative  ng/mL Final  . Carboxy-THC 04/17/2017 27.1  ng/mL Final  . Hydroxy-THC 04/17/2017 1.0  ng/mL Final  . Cannabinol 04/17/2017 Negative  ng/mL Final  . Cannabidiol 04/17/2017 Negative  ng/mL Final   Comment: (NOTE) Confirmation threshold: 1.0 ng/mL Performed At: [facility] [place], [place] [id number] Collier [place] BS:[id number] Performed at [facility], [address], [place], [place] [address]   . Oxycodones Confirmation 04/17/2017 Positive   Final  . Oxycocone 04/17/2017 25.7  ng/mL Final  . Oxymorphone 04/17/2017 Negative  ng/mL Final   Comment: (NOTE) Confirmation threshold: 1.0 ng/mL Performed At: [facility] [place], [place] [id number] Collier Salina TJ:[id number] Performed at [facility], [address] [place],  [address]     Assessment:  Philip [name] is a 57 y.o. male with extensive stage small cell lung cancer.  He presented with a 2 year history of night sweats and a 36 pound weight loss in 6 months.  Right supraclavicular biopsy on 07/19/2016 revealed small cell carcinoma of the lung.  CEA was 1.7 on 07/31/2016.  Chest CT on 06/28/2016 revealed extensive adenopathy.  There were multiple lymph nodes descending thoracic aorta, largest measuring 2.1 x 1.7 cm.  There was adenopathy in the aortopulmonary window with the largest confluence of lymph nodes measuring 4 x 2.6 cm. There was hilar adenopathy on the left measuring 2 x 1.9 cm. There were multiple enlarged lymph nodes in the pretracheal region, largest 1.7 x 1.7 cm.  There were lymph  nodes to the left of the carina measuring 2.8 x 1.9 cm. There was a subcarinal lymph node measuring 2.4 x 1.6 cm. There was a 4 x 3 mm nodular opacity in the anterior segment of the left upper lobe.    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  There was nodal metastasis within the chest and supraclavicular/low jugular regions.  There was multifocal osseous metastasis.  There was possibly a pathologic fracture involving the posteromedial right tenth rib.  There was new left lower lobe opacity with mild hypermetabolism, suspicious for infection or postobstructive pneumonitis.  He completed a course of Levaquin for apost-obstructive pneumonia.  He experienced transient positional left facial burning.  Head MRI on 07/26/2016 revealed no evidence of brain metastasis. There was punctate DWI hyperintensity along a high right frontal gyrus, possible recent infarct.  There was a small occipital bone metastasis.  Head MRA on 07/27/2016 revealed normal large and medium sized vessels.  Bilateral carotid duplex on 07/27/2016 revealed <  50% stenosis in the right and left internal carotis arteries.  Echo on 07/27/2016 revealed an EF of 45-50% without cardiac source of emboli.  He received 4 cycles of carboplatin and etoposide (07/31/2016 - 10/02/2016) with OnPro Neulasta support.  Platelet nadir was 40,000 on day 16 of cycle #2 and 23,000 on day 15 of cycle #3.  He received 2 units of PRBC with cycle #3 and 1 unit with cycle #4.  He received Xgeva on 08/13/2016 (last 10/30/2016).  Chest CT on 09/11/2016 revealed a marked response to therapy.  There was near complete resolution of left suprahilar mass and thoracic adenopathy.  There was interval sclerosis indicative of healing site of osseous metastasis.  Thoracic spine MRI on 10/22/2016 reveals sclerotic enhancing lesion at T7 vertebral body on the left compatible with metastasis. There was a healing fracture the  right medial 10th rib.  PET scan on 11/12/2016 revealed a new 10 mm hypermetabolic left axillary lymph node (SUV 3.6) due to a left hand abscess.  There were no other sites of metabolically active disease.  He completed a course of thoracic spine radiation (3000 cGy in 10 fractions) on 01/11/2017.  He received PCI (3000 cGy in 15 fractions) from 01/28/2017 - 02/15/2017.  Chest, abdomen, and pelvic CT on 03/18/2017 revealed new areas of irregular ground-glass in the medial aspects of the right upper and right lower lobes as well as somewhat amorphous ground-glass in the lingula and left lower lobe. Time interval favored infectious or inflammatory lesions.  Osseous metastatic disease was stable.  There was resolved or resected left axillary lymph node.  Bone scan on 04/10/2017 revealed multiple foci of skeletal metastasis within the ribs, pelvis, and RIGHT clavicle.  Multiple areas of activity corresponding to sclerotic lesions on CT and non-hypermetabolic lesions on PET scan.  He has chemotherapy induced anemia.  Anemia work-up on 09/11/2016 revealed the following normal labs:  Ferritin (219), iron sat (28%), TIBC (227), B12 (3055), and folate (6.8).  He has a history of left lower extremity DVT a few months after his cardiac surgery in 08/2014.  He has peripheral vascular disease s/p stent placement in 03/2015.  He is on a Plavix.  He has not had a colonoscopy in 30 years.  He was ineligible for the AbbVie clinical trial ([id number]) secondary to his left hand abscess.  He underwent surgical debridement of a left hand abscess on 11/15/2016.  Wound cultures grew MRSA.  He was treated with emperic IV Vancomycin then Bactrim.   Symptomatically, he feels "weak".  He has continued nausea.  He has lost weight.  He has significant back pain despite radiation.  He is taking Oxycontin 20 mg BID and 4 oxycodone/day.  WBC is 4300 with an [place] of 3000. Hemoglobin is 10.2.  Potassium is 3.5. Sodium is low at 133.  Magnesium  Is 1.4.  Plan: 1.  Labs today:  BMP, Mg.   2.  Review interval bone scan- multiple areas of activity corresponding to sclerotic lesions on CT and non-hypermetabolic lesions on PET scan. 3.  Discuss bone scan with radiology.  Etiology of pain unclear.  Discuss PET scan. 4.  Follow-up with nutrition today. 5.  Magnesium sulfate 2 gm IV. 6.  Increase oral magnesium to 1 tablet BID. 7.  Urine drug screen secondary to concerns. 8.  Rx: oxycodone 5 mg po q 6 hours prn pain (dis: #30), Phenergan 25 mg PR q 6 hours prn nausea (dis: #12; 5 refills). 9.  Schedule PET scan. 10.  RTC after PET scan for MD assessment, labs (BMP, Mg), and +/- IV Mg.   Lequita Asal, MD  04/25/2017, 3:38 AM   I saw and evaluated the patient, participating in the key portions of the service and reviewing pertinent diagnostic studies and records.  I reviewed the nurse practitioner's note and agree with the findings and the plan.  The assessment and plan were discussed with the patient.  Additional diagnostic studies of *** are needed to clarify *** and would change the clinical management.  A few ***multiple questions were asked by the patient and answered.   Nolon Stalls, MD 04/25/2017,3:38 AM

## 2017-04-26 ENCOUNTER — Encounter
Admission: RE | Admit: 2017-04-26 | Discharge: 2017-04-26 | Disposition: A | Discharge status: Home / Self Care | Payer: Medicaid Other | Source: Ambulatory Visit | Attending: Hematology and Oncology | Admitting: Hematology and Oncology

## 2017-04-26 DIAGNOSIS — C7951 Secondary malignant neoplasm of bone: Secondary | ICD-10-CM | POA: Insufficient documentation

## 2017-04-26 DIAGNOSIS — C3490 Malignant neoplasm of unspecified part of unspecified bronchus or lung: Secondary | ICD-10-CM | POA: Insufficient documentation

## 2017-04-26 LAB — GLUCOSE, CAPILLARY: Glucose-Capillary: 94 mg/dL (ref 65–99)

## 2017-04-26 MED ORDER — FLUDEOXYGLUCOSE F - 18 (FDG) INJECTION
12.7100 | Freq: Once | INTRAVENOUS | Status: AC | PRN
  Administered 2017-04-26: 12.71 via INTRAVENOUS

## 2017-04-30 ENCOUNTER — Ambulatory Visit: Payer: Medicaid Other | Admitting: Nurse Practitioner

## 2017-05-01 ENCOUNTER — Ambulatory Visit: Payer: Medicaid Other | Admitting: Pain Medicine

## 2017-05-02 ENCOUNTER — Other Ambulatory Visit: Payer: Medicaid Other

## 2017-05-02 ENCOUNTER — Ambulatory Visit: Payer: Medicaid Other | Admitting: Hematology and Oncology

## 2017-05-16 ENCOUNTER — Inpatient Hospital Stay (HOSPITAL_BASED_OUTPATIENT_CLINIC_OR_DEPARTMENT_OTHER): Payer: Medicaid Other | Admitting: Hematology and Oncology

## 2017-05-16 ENCOUNTER — Other Ambulatory Visit: Payer: Self-pay

## 2017-05-16 ENCOUNTER — Inpatient Hospital Stay: Payer: Medicaid Other | Attending: Hematology and Oncology

## 2017-05-16 ENCOUNTER — Encounter: Payer: Self-pay | Admitting: Hematology and Oncology

## 2017-05-16 ENCOUNTER — Other Ambulatory Visit: Payer: Self-pay | Admitting: Hematology and Oncology

## 2017-05-16 VITALS — BP 137/96 | HR 95 | Temp 97.8°F | Resp 18 | Wt 131.3 lb

## 2017-05-16 DIAGNOSIS — R634 Abnormal weight loss: Secondary | ICD-10-CM

## 2017-05-16 DIAGNOSIS — T451X5S Adverse effect of antineoplastic and immunosuppressive drugs, sequela: Secondary | ICD-10-CM

## 2017-05-16 DIAGNOSIS — Z803 Family history of malignant neoplasm of breast: Secondary | ICD-10-CM

## 2017-05-16 DIAGNOSIS — C7951 Secondary malignant neoplasm of bone: Secondary | ICD-10-CM

## 2017-05-16 DIAGNOSIS — Z9221 Personal history of antineoplastic chemotherapy: Secondary | ICD-10-CM | POA: Diagnosis not present

## 2017-05-16 DIAGNOSIS — F17210 Nicotine dependence, cigarettes, uncomplicated: Secondary | ICD-10-CM

## 2017-05-16 DIAGNOSIS — Z801 Family history of malignant neoplasm of trachea, bronchus and lung: Secondary | ICD-10-CM

## 2017-05-16 DIAGNOSIS — I739 Peripheral vascular disease, unspecified: Secondary | ICD-10-CM

## 2017-05-16 DIAGNOSIS — I119 Hypertensive heart disease without heart failure: Secondary | ICD-10-CM

## 2017-05-16 DIAGNOSIS — Z452 Encounter for adjustment and management of vascular access device: Secondary | ICD-10-CM | POA: Insufficient documentation

## 2017-05-16 DIAGNOSIS — L02414 Cutaneous abscess of left upper limb: Secondary | ICD-10-CM

## 2017-05-16 DIAGNOSIS — Z800 Family history of malignant neoplasm of digestive organs: Secondary | ICD-10-CM

## 2017-05-16 DIAGNOSIS — C3412 Malignant neoplasm of upper lobe, left bronchus or lung: Secondary | ICD-10-CM

## 2017-05-16 DIAGNOSIS — C3490 Malignant neoplasm of unspecified part of unspecified bronchus or lung: Secondary | ICD-10-CM

## 2017-05-16 DIAGNOSIS — F1210 Cannabis abuse, uncomplicated: Secondary | ICD-10-CM

## 2017-05-16 DIAGNOSIS — D6481 Anemia due to antineoplastic chemotherapy: Secondary | ICD-10-CM | POA: Diagnosis not present

## 2017-05-16 DIAGNOSIS — E785 Hyperlipidemia, unspecified: Secondary | ICD-10-CM | POA: Diagnosis not present

## 2017-05-16 DIAGNOSIS — G8929 Other chronic pain: Secondary | ICD-10-CM

## 2017-05-16 DIAGNOSIS — E876 Hypokalemia: Secondary | ICD-10-CM

## 2017-05-16 DIAGNOSIS — I2510 Atherosclerotic heart disease of native coronary artery without angina pectoris: Secondary | ICD-10-CM

## 2017-05-16 DIAGNOSIS — Z955 Presence of coronary angioplasty implant and graft: Secondary | ICD-10-CM

## 2017-05-16 DIAGNOSIS — Z95828 Presence of other vascular implants and grafts: Secondary | ICD-10-CM

## 2017-05-16 DIAGNOSIS — Z951 Presence of aortocoronary bypass graft: Secondary | ICD-10-CM

## 2017-05-16 DIAGNOSIS — Z923 Personal history of irradiation: Secondary | ICD-10-CM

## 2017-05-16 DIAGNOSIS — Z7189 Other specified counseling: Secondary | ICD-10-CM

## 2017-05-16 DIAGNOSIS — Z8614 Personal history of Methicillin resistant Staphylococcus aureus infection: Secondary | ICD-10-CM

## 2017-05-16 DIAGNOSIS — M546 Pain in thoracic spine: Secondary | ICD-10-CM

## 2017-05-16 DIAGNOSIS — Z886 Allergy status to analgesic agent status: Secondary | ICD-10-CM

## 2017-05-16 DIAGNOSIS — Z79899 Other long term (current) drug therapy: Secondary | ICD-10-CM

## 2017-05-16 DIAGNOSIS — Z86718 Personal history of other venous thrombosis and embolism: Secondary | ICD-10-CM

## 2017-05-16 LAB — COMPREHENSIVE METABOLIC PANEL
ALT: 18 U/L (ref 17–63)
AST: 28 U/L (ref 15–41)
Albumin: 3.1 g/dL — ABNORMAL LOW (ref 3.5–5.0)
Alkaline Phosphatase: 80 U/L (ref 38–126)
Anion gap: 10 (ref 5–15)
BUN: 6 mg/dL (ref 6–20)
CO2: 26 mmol/L (ref 22–32)
Calcium: 8.5 mg/dL — ABNORMAL LOW (ref 8.9–10.3)
Chloride: 98 mmol/L — ABNORMAL LOW (ref 101–111)
Creatinine, Ser: 0.83 mg/dL (ref 0.61–1.24)
GFR calc Af Amer: >60 mL/min (ref >60)
GFR calc non Af Amer: >60 mL/min (ref >60)
Glucose, Bld: 96 mg/dL (ref 65–99)
Potassium: 3.4 mmol/L — ABNORMAL LOW (ref 3.5–5.1)
Sodium: 134 mmol/L — ABNORMAL LOW (ref 135–145)
Total Bilirubin: 0.7 mg/dL (ref 0.3–1.2)
Total Protein: 6.1 g/dL — ABNORMAL LOW (ref 6.5–8.1)

## 2017-05-16 LAB — CBC WITH DIFFERENTIAL/PLATELET
Basophils Absolute: 0 10*3/uL (ref 0–0.1)
Basophils Relative: 1 %
Eosinophils Absolute: 0.1 10*3/uL (ref 0–0.7)
Eosinophils Relative: 2 %
HCT: 36.9 % — ABNORMAL LOW (ref 40.0–52.0)
Hemoglobin: 12.7 g/dL — ABNORMAL LOW (ref 13.0–18.0)
Lymphocytes Relative: 21 %
Lymphs Abs: 1 10*3/uL (ref 1.0–3.6)
MCH: 34.7 pg — ABNORMAL HIGH (ref 26.0–34.0)
MCHC: 34.4 g/dL (ref 32.0–36.0)
MCV: 100.9 fL — ABNORMAL HIGH (ref 80.0–100.0)
Monocytes Absolute: 0.4 10*3/uL (ref 0.2–1.0)
Monocytes Relative: 8 %
Neutro Abs: 3.3 10*3/uL (ref 1.4–6.5)
Neutrophils Relative %: 68 %
Platelets: 221 10*3/uL (ref 150–440)
RBC: 3.66 MIL/uL — ABNORMAL LOW (ref 4.40–5.90)
RDW: 15.1 % — ABNORMAL HIGH (ref 11.5–14.5)
WBC: 4.9 10*3/uL (ref 3.8–10.6)

## 2017-05-16 LAB — MAGNESIUM: Magnesium: 1.4 mg/dL — ABNORMAL LOW (ref 1.7–2.4)

## 2017-05-16 MED ORDER — POTASSIUM CHLORIDE ER 10 MEQ PO TBCR: 10.0000 meq | EXTENDED_RELEASE_TABLET | Freq: Every day | ORAL | 1 refills | Status: DC

## 2017-05-16 MED ORDER — MAGNESIUM OXIDE 400 (241.3 MG) MG PO TABS: 400.0000 mg | ORAL_TABLET | Freq: Three times a day (TID) | ORAL | 3 refills | Status: DC

## 2017-05-16 MED ORDER — HEPARIN SOD (PORK) LOCK FLUSH 100 UNIT/ML IV SOLN
500.0000 [IU] | INTRAVENOUS | Status: AC | PRN
  Administered 2017-05-16: 500 [IU]

## 2017-05-16 MED ORDER — SODIUM CHLORIDE 0.9% FLUSH
10.0000 mL | INTRAVENOUS | Status: AC | PRN
  Administered 2017-05-16: 10 mL
  Filled 2017-05-16: qty 10
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
## 2017-05-16 NOTE — Progress Notes (Signed)
[facility] day:  05/16/2017   Chief Complaint: Philip [name] is a 57 y.o. male with extensive stage small cell lung cancer who is seen for review of interval PET scan.  HPI:  The patient was last seen in the medical oncology clinic on 04/11/2017.  At that time, he felt "weak".  He had continued nausea.  He had lost weight.  He had significant back pain despite radiation.  He was taking Oxycontin 20 mg BID and 4 oxycodone/day.  WBC was 4300 with an ANC of 3000. Hemoglobin was 10.2.  Potassium was 3.5. Sodium was low at 133. Magnesium was 1.4.  He was scheduled for PET scan as prior imaging (CT and bone scan) revealed no obvious progressive disease.  He received IV magnesium.  Oral magnesium was increased to BID.  He saw nutrition.  Urine drug screen was negative.  He saw Beckey Rutter on 04/17/2017.  He complained of back pain 6 out of 10.  He had an abscess on his right elbow for which he was receiving cephalexin through his PCP.  Urine drug screen was negative despite patient's report of taking oxycodone and Oxycontin.  Blood testing for narcotics was positive for oxycodone (25.7 ng/ml).  PET scan on 04/26/2017 revealed bandlike densities medially in the right upper lobe and medially in the right lower lobe were new from 11/12/2016 but were present on 03/18/2017, and had faint hypermetabolic activity. Radiation pneumonitis could have this appearance.  The morphology would be unusual for malignancy but surveillance is warranted.  There were scattered stable sclerotic bony lesions that were not hypermetabolic.  There was persistent accentuated activity in the vicinity of the anus may be physiologic but was technically nonspecific.  During the interim, patient has pain in his lower legs. He advises that he has follow up appointments with vascular to discuss stent placement.   Patient continues to report chronic upper back pain. Patient states, "I had the  pain way before I ever had the cancer. It hurt for 3 years before I ever even knew that I had cancer". Patient states, "My pain gets go bad that I end up passing out and hitting the floor".  Patient advises that he has had nausea and vomiting.  Patient states, "I have been smoking marijuana because I didn't have any pain medication".  Weight stable at 131 pounds. Patient was scheduled to see pain management on 04/24/2017 at 0945, however patient cancelled the appointment because "they won't do anything for me because I had marijuana in my system".   He denies increased shortness of breath or chest pain. He continues on oral magnesium as prescribed. He denies diarrhea.    Past Medical History:  Diagnosis Date  . Anemia   . CAD (coronary artery disease)    a. 08/2014 Inf STEMI/CABG x 3 (LIMA->LAD, VG->Diag, RIMA->RCA).  . DVT, recurrent, lower extremity, acute, left ([place]) 2016  . Hyperlipidemia   . Hypertension   . Hypertensive heart disease   . Lung cancer ([place])   . PAD (peripheral artery disease) ([place])    a. 03/2015 Periph Angio: short occlusion of L pop w/ evidence of embolization into the DP->5.0x50 mm Inova self-expanding stent;  b. 04/08/2015 ABI: R 1.12, L 0.99.  . Tobacco abuse   . Toe amputation status, left [facility]) 07/09/2016   2017    Past Surgical History:  Procedure Laterality Date  . CARDIAC CATHETERIZATION N/A 08/24/2014   Procedure: Left Heart Cath and  Coronary Angiography;  Surgeon: Isaias Cowman, MD;  Location: [facility];  Service: Cardiovascular;  Laterality: N/A;  . CARDIAC CATHETERIZATION N/A 08/24/2014   Procedure: Coronary Stent Intervention;  Surgeon: Isaias Cowman, MD;  Location: [facility];  Service: Cardiovascular;  Laterality: N/A;  . CORONARY ARTERY BYPASS GRAFT N/A 08/25/2014   Procedure: CORONARY ARTERY BYPASS GRAFTING (CABG), ON PUMP, TIMES THREE, USING BILATERAL MAMMARY ARTERIES, RIGHT GREATER SAPHENOUS VEIN HARVESTED  ENDOSCOPICALLY;  Surgeon: Melrose Nakayama, MD;  Location: [facility];  Service: Open Heart Surgery;  Laterality: N/A;  . INCISION AND DRAINAGE ABSCESS Left 11/15/2016   Procedure: INCISION AND DRAINAGE ABSCESS OF LEFT WRIST;  Surgeon: Roseanne Kaufman, MD;  Location: [facility];  Service: Orthopedics;  Laterality: Left;  . PERIPHERAL VASCULAR CATHETERIZATION N/A 03/30/2015   Procedure: Abdominal Aortogram w/Lower Extremity;  Surgeon: Wellington Hampshire, MD;  Location: [facility];  Service: Cardiovascular;  Laterality: N/A;  . PORTA CATH INSERTION N/A 07/27/2016   Procedure: Glori Luis Cath Insertion;  Surgeon: Algernon Huxley, MD;  Location: [facility];  Service: Cardiovascular;  Laterality: N/A;  . TEE WITHOUT CARDIOVERSION N/A 08/25/2014   Procedure: TRANSESOPHAGEAL ECHOCARDIOGRAM (TEE);  Surgeon: Melrose Nakayama, MD;  Location: [facility];  Service: Open Heart Surgery;  Laterality: N/A;    Family History  Problem Relation Age of Onset  . Hypertension Father   . Lung cancer Father   . Heart attack Mother 55       STENT  . Hyperlipidemia Mother   . Heart attack Brother   . Heart attack Brother   . Breast cancer Sister   . Heart attack Sister   . Colon cancer Paternal Grandfather     Social History:  reports that he has been smoking cigarettes.  He has been smoking about 0.50 packs per day. he has never used smokeless tobacco. He reports that he drinks alcohol. He reports that he does not use drugs.  The patient smokes 1/2 pack of cigarettes/day (previously 2 1/2 ppd).  He started smoking in his teens.  He denies any exposure to radiation or toxins.  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  He lives in [place].  His wife is named Publishing copy.  He is accompanied by his wife today.  Allergies:  Allergies  Allergen Reactions  . Tylenol [Acetaminophen] Palpitations  . Amitriptyline Other (See Comments)    Burning sensation all over  . Contrast Media [Iodinated Diagnostic Agents]  Other (See Comments)    Pt states that he got "knots behind his ears"  . Nsaids Other (See Comments)    Blood in stools  . Ibuprofen Other (See Comments) and Nausea Only    Blood in stools  . Tape Rash and Other (See Comments)    PLEASE USE COBAN WRAP; THE PATIENT'S SKIN TEARS WHEN BANDAGES ARE REMOVED!!    Current Medications: Current Outpatient Medications  Medication Sig Dispense Refill  . atorvastatin (LIPITOR) 40 MG tablet TAKE 1 TABLET BY MOUTH DAILY 90 tablet 3  . bacitracin 500 UNIT/GM ointment Apply 1 application topically 2 (two) times daily. 15 g 0  . benzonatate (TESSALON) 100 MG capsule TK 1 C PO TID PRN  2  . buPROPion (WELLBUTRIN XL) 150 MG 24 hr tablet TK 1 T PO QD  5  . Calcium Carb-Cholecalciferol (CALCIUM + D3 PO) Take 1 tablet by mouth 3 (three) times daily.    . clopidogrel (PLAVIX) 75 MG tablet  Take 1 tablet (75 mg total) by mouth daily. 90 tablet 3  . diphenhydrAMINE (BENADRYL) 25 mg capsule Take 25 mg by mouth 2 (two) times daily as needed for allergies.     Marland Kitchen FLOVENT HFA 220 MCG/ACT inhaler INL 2 PFS PO BID  5  . fluconazole (DIFLUCAN) 100 MG tablet Take 1 tablet (100 mg total) daily by mouth. 7 tablet 3  . furosemide (LASIX) 20 MG tablet Take 20 mg daily by mouth.  1  . lidocaine-prilocaine (EMLA) cream Apply to affected area once (Patient taking differently: Apply 1 application topically See admin instructions. TO AFFECTED AREA AS DIRECTED) 30 g 3  . lisinopril (PRINIVIL,ZESTRIL) 5 MG tablet Take 1 tablet (5 mg total) by mouth daily. 90 tablet 1  . magnesium oxide (MAG-OX) 400 (241.3 Mg) MG tablet Take 1 tablet (400 mg total) by mouth daily. 60 tablet 1  . metoprolol tartrate (LOPRESSOR) 25 MG tablet TAKE 1 TABLET BY MOUTH TWICE DAILY 180 tablet 3  . ondansetron (ZOFRAN) 8 MG tablet Take 1 tablet (8 mg total) by mouth every 8 (eight) hours as needed for nausea or vomiting. 30 tablet 0  . pantoprazole (PROTONIX) 40 MG tablet TK 1 T PO QD  5  . potassium chloride  (K-DUR) 10 MEQ tablet Take 1 tablet (10 mEq total) by mouth daily. 60 tablet 1  . PROAIR HFA 108 (90 Base) MCG/ACT inhaler INL 2 PFS PO Q 4 TO 6 H PRN  5  . STIOLTO RESPIMAT 2.5-2.5 MCG/ACT AERS INL 2 PFS PO QD  5  . sucralfate (CARAFATE) 1 g tablet Take 1 tablet (1 g total) by mouth 3 (three) times daily. Dissolve in 2-3 tbsp warm water, swish and swallow 90 tablet 3  . Vitamin D, Ergocalciferol, (DRISDOL) 50000 units CAPS capsule TK ONE C PO Q WEEK  5  . nystatin (MYCOSTATIN) 100000 UNIT/ML suspension Take 5 mLs (500,000 Units total) by mouth 4 (four) times daily. Swish and spit. (Patient not taking: Reported on 05/16/2017) 60 mL 0  . oxyCODONE (OXY IR/ROXICODONE) 5 MG immediate release tablet Take 1 tablet (5 mg total) by mouth every 4 (four) hours as needed for severe pain. (Patient not taking: Reported on 05/16/2017) 30 tablet 0  . oxyCODONE (OXYCONTIN) 20 mg 12 hr tablet Take 1 tablet (20 mg total) by mouth every 12 (twelve) hours. (Patient not taking: Reported on 05/16/2017) 60 tablet 0  . promethazine (PHENERGAN) 25 MG suppository Place 1 suppository (25 mg total) rectally every 6 (six) hours as needed for nausea or vomiting. (Patient not taking: Reported on 05/16/2017) 12 each 5   No current facility-administered medications for this visit.    Facility-Administered Medications Ordered in Other Visits  Medication Dose Route Frequency Provider Last Rate Last Dose  . heparin lock flush 100 unit/mL  500 Units Intravenous Once Lequita Asal, MD        Review of Systems:  GENERAL:  Feels "ok". No fevers or sweats.  Weight down 1 pound.  PERFORMANCE STATUS (ECOG):  1 HEENT:  No visual changes, runny nose, sore throat, mouth sores or tenderness. Lungs: No shortness of breath or cough.  No hemoptysis. Cardiac:  No chest pain, palpitations, orthopnea, or PND. GI:  Eats small portions 4-5 times/day.  No diarrhea, constipation, melena or hematochezia.   GU:  No urgency, frequency, dysuria, or  hematuria. Musculoskeletal:  Low back pain (chronic).  Upper back pain.  No joint pain.  No muscle tenderness. Extremities:  Lower extremity  pain- plan for follow-up with vascular surgery.  No swelling.  Left elbow, healed. Skin:  No rashes or skin changes. Neuro:  Sinus headache.  No numbness or weakness, balance or coordination issues. Endocrine:  No diabetes, thyroid issues, hot flashes or night sweats. Psych:  No mood changes, depression or anxiety. Pain:  Upper back pain 7 out of 10 (chronic before diagnosis of cancer- see HPI). Review of systems:  All other systems reviewed and found to be negative.  Physical Exam: Blood pressure (!) 137/96, pulse 95, temperature 97.8 F (36.6 C), temperature source Tympanic, resp. rate 18, weight 131 lb 4.8 oz (59.6 kg). GENERAL:  Thin gentleman sitting comfortably in the exam room in no acute distress.  He has a cane at his side. MENTAL STATUS: Alert and oriented to person, place and time. HEAD:Wearing a cap.  Normocephalic, atraumatic, face symmetric.  Cushingoid features. EYES:[place]neyes. Pupils equal round and reactive to light and accomodation. No conjunctivitis or scleral icterus. EXH:BZJIRCVELF clear without lesion. Tonguenormal. Poor dentition. Mucous membranes moist. RESPIRATORY:Clear to auscultationwithout rales, wheezes or rhonchi. CARDIOVASCULAR:Regular rate andrhythmwithout murmur, rub or gallop. ABDOMEN:Soft, non-tender, with active bowel sounds, and no hepatosplenomegaly. No masses. BACK:  Pain on palpation cervical and upper and mid thoracic spine. SKIN: No rashes or ulcers. EXTREMITIES: No lower extremity edema, no skin discoloration or tenderness. No palpable cords. LYMPHNODES: No palpable cervical, supraclavicular, axillary or inguinal adenopathy  NEUROLOGICAL: Unremarkable. PSYCH: Appropriate.   Appointment on 05/16/2017  Component Date Value Ref Range Status  . Sodium 05/16/2017 134* 135 - 145  mmol/L Final  . Potassium 05/16/2017 3.4* 3.5 - 5.1 mmol/L Final  . Chloride 05/16/2017 98* 101 - 111 mmol/L Final  . CO2 05/16/2017 26  22 - 32 mmol/L Final  . Glucose, Bld 05/16/2017 96  65 - 99 mg/dL Final  . BUN 05/16/2017 6  6 - 20 mg/dL Final  . Creatinine, Ser 05/16/2017 0.83  0.61 - 1.24 mg/dL Final  . Calcium 05/16/2017 8.5* 8.9 - 10.3 mg/dL Final  . Total Protein 05/16/2017 6.1* 6.5 - 8.1 g/dL Final  . Albumin 05/16/2017 3.1* 3.5 - 5.0 g/dL Final  . AST 05/16/2017 28  15 - 41 U/L Final  . ALT 05/16/2017 18  17 - 63 U/L Final  . Alkaline Phosphatase 05/16/2017 80  38 - 126 U/L Final  . Total Bilirubin 05/16/2017 0.7  0.3 - 1.2 mg/dL Final  . GFR calc non Af Amer 05/16/2017 >60  >60 mL/min Final  . GFR calc Af Amer 05/16/2017 >60  >60 mL/min Final   Comment: (NOTE) The eGFR has been calculated using the CKD EPI equation. This calculation has not been validated in all clinical situations. eGFR's persistently <60 mL/min signify possible Chronic Kidney Disease.   Georgiann Hahn gap 05/16/2017 10  5 - 15 Final   Performed at [facility], [address] [place], [place] [address]  . Magnesium 05/16/2017 1.4* 1.7 - 2.4 mg/dL Final   Performed at [facility], [address] [place], [place] [address]  . WBC 05/16/2017 4.9  3.8 - 10.6 K/uL Final  . RBC 05/16/2017 3.66* 4.40 - 5.90 MIL/uL Final  . Hemoglobin 05/16/2017 12.7* 13.0 - 18.0 g/dL Final  . HCT 05/16/2017 36.9* 40.0 - 52.0 % Final  . MCV 05/16/2017 100.9* 80.0 - 100.0 fL Final  . MCH 05/16/2017 34.7* 26.0 - 34.0 pg Final  . MCHC 05/16/2017 34.4  32.0 - 36.0 g/dL Final  . RDW 05/16/2017 15.1* 11.5 - 14.5 %  Final  . Platelets 05/16/2017 221  150 - 440 K/uL Final  . Neutrophils Relative % 05/16/2017 68  % Final  . Neutro Abs 05/16/2017 3.3  1.4 - 6.5 K/uL Final  . Lymphocytes Relative 05/16/2017 21  % Final  . Lymphs Abs 05/16/2017 1.0  1.0 - 3.6 K/uL Final  . Monocytes Relative 05/16/2017 8  % Final  .  Monocytes Absolute 05/16/2017 0.4  0.2 - 1.0 K/uL Final  . Eosinophils Relative 05/16/2017 2  % Final  . Eosinophils Absolute 05/16/2017 0.1  0 - 0.7 K/uL Final  . Basophils Relative 05/16/2017 1  % Final  . Basophils Absolute 05/16/2017 0.0  0 - 0.1 K/uL Final   Performed at [facility], [address], [place], [place] [address]    Assessment:  Philip [name] is a 57 y.o. male with extensive stage small cell lung cancer.  He presented with a 2 year history of night sweats and a 36 pound weight loss in 6 months.  Right supraclavicular biopsy on 07/19/2016 revealed small cell carcinoma of the lung.  CEA was 1.7 on 07/31/2016.  Chest CT on 06/28/2016 revealed extensive adenopathy.  There were multiple lymph nodes descending thoracic aorta, largest measuring 2.1 x 1.7 cm.  There was adenopathy in the aortopulmonary window with the largest confluence of lymph nodes measuring 4 x 2.6 cm. There was hilar adenopathy on the left measuring 2 x 1.9 cm. There were multiple enlarged lymph nodes in the pretracheal region, largest 1.7 x 1.7 cm.  There were lymph nodes to the left of the carina measuring 2.8 x 1.9 cm. There was a subcarinal lymph node measuring 2.4 x 1.6 cm. There was a 4 x 3 mm nodular opacity in the anterior segment of the left upper lobe.    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  There was nodal metastasis within the chest and supraclavicular/low jugular regions.  There was multifocal osseous metastasis.  There was possibly a pathologic fracture involving the posteromedial right tenth rib.  There was new left lower lobe opacity with mild hypermetabolism, suspicious for infection or postobstructive pneumonitis.  He completed a course of Levaquin for apost-obstructive pneumonia.  He experienced transient positional left facial burning.  Head MRI on 07/26/2016 revealed no evidence of brain metastasis. There was punctate DWI  hyperintensity along a high right frontal gyrus, possible recent infarct.  There was a small occipital bone metastasis.  Head MRA on 07/27/2016 revealed normal large and medium sized vessels.  Bilateral carotid duplex on 07/27/2016 revealed <50% stenosis in the right and left internal carotis arteries.  Echo on 07/27/2016 revealed an EF of 45-50% without cardiac source of emboli.  He received 4 cycles of carboplatin and etoposide (07/31/2016 - 10/02/2016) with OnPro Neulasta support.  Platelet nadir was 40,000 on day 16 of cycle #2 and 23,000 on day 15 of cycle #3.  He received 2 units of PRBC with cycle #3 and 1 unit with cycle #4.  He received Xgeva on 08/13/2016 (last 10/30/2016).  Chest CT on 09/11/2016 revealed a marked response to therapy.  There was near complete resolution of left suprahilar mass and thoracic adenopathy.  There was interval sclerosis indicative of healing site of osseous metastasis.  Thoracic spine MRI on 10/22/2016 reveals sclerotic enhancing lesion at T7 vertebral body on the left compatible with metastasis. There was a healing fracture the right medial 10th rib.  PET scan on 11/12/2016 revealed a new 10 mm  hypermetabolic left axillary lymph node (SUV 3.6) due to a left hand abscess.  There were no other sites of metabolically active disease.  He completed a course of thoracic spine radiation (3000 cGy in 10 fractions) on 01/11/2017.  He received PCI (3000 cGy in 15 fractions) from 01/28/2017 - 02/15/2017.  Chest, abdomen, and pelvic CT on 03/18/2017 revealed new areas of irregular ground-glass in the medial aspects of the right upper and right lower lobes as well as somewhat amorphous ground-glass in the lingula and left lower lobe. Time interval favored infectious or inflammatory lesions.  Osseous metastatic disease was stable.  There was resolved or resected left axillary lymph node.  Bone scan on 04/10/2017 revealed multiple foci of skeletal metastasis within the ribs,  pelvis, and RIGHT clavicle.  Multiple areas of activity corresponding to sclerotic lesions on CT and non-hypermetabolic lesions on PET scan.  PET scan on 04/26/2017 revealed bandlike densities medially in the right upper lobe and medially in the right lower lobe were new from 11/12/2016 but were present on 03/18/2017, and had faint hypermetabolic activity. Radiation pneumonitis could have this appearance.  The morphology would be unusual for malignancy but surveillance is warranted.  There were scattered stable sclerotic bony lesions that were not hypermetabolic.  There was persistent accentuated activity in the vicinity of the anus may be physiologic but was technically nonspecific.  He has chemotherapy induced anemia.  Anemia work-up on 09/11/2016 revealed the following normal labs:  Ferritin (219), iron sat (28%), TIBC (227), B12 (3055), and folate (6.8).  He has a history of left lower extremity DVT a few months after his cardiac surgery in 08/2014.  He has peripheral vascular disease s/p stent placement in 03/2015.  He is on a Plavix.  He has not had a colonoscopy in 30 years.  He was ineligible for the AbbVie clinical trial ([id number]) secondary to his left hand abscess.  He underwent surgical debridement of a left hand abscess on 11/15/2016.  Wound cultures grew MRSA.  He was treated with emperic IV Vancomycin then Bactrim.   Symptomatically, he has significant chronic back pain (present 3 years ago before his diagnosis of cancer).  He has been using marijuana to manage his pain.  WBC is 4900 with an [place] of 3300. Hemoglobin is 12.7.  Potassium is 3.4.  Magnesium  Is 1.4.  Plan: 1.  Labs today:  CBC with diff, CMP, Mg. 2.  Review interval PET scan- no evidence of active cancer.  Discuss plan for ongoing close surveillance. 3.  Discuss thoracic pain.  Etiology unclear.  Discuss possible referral to orthopedics or neurosurgery.  Consider repeat thoracic MRI. 4.  Discuss pain management referral.  Patient encouraged to follow up for continuing pain management services. Made aware that his urine will need to be NEGATIVE for [facility] when he is seen, as it is not acceptable or safe to concurrently use opioids and THC.  5.  Discuss oral magnesium. Patient continues to be low at 1.4. Increase oral magnesium to TID. New Rx sent. Written information on Mg rich foods provided.  6.  Refill Rx for Potassium. 7.  RTC in 1 month for MD assessment, labs (BMP, Mg), and +/- IV Mg.   Honor Loh, NP  05/16/2017, 2:28 PM   I saw and evaluated the patient, participating in the key portions of the service and reviewing pertinent diagnostic studies and records.  I reviewed the nurse practitioner's note and agree with the findings and the plan.  The assessment and  plan were discussed with the patient. Several questions were asked by the patient and answered.   Nolon Stalls, MD 05/16/2017,2:28 PM

## 2017-05-17 ENCOUNTER — Other Ambulatory Visit: Payer: Self-pay | Admitting: Urgent Care

## 2017-05-22 ENCOUNTER — Inpatient Hospital Stay: Payer: Medicaid Other

## 2017-05-28 ENCOUNTER — Ambulatory Visit (INDEPENDENT_AMBULATORY_CARE_PROVIDER_SITE_OTHER): Payer: Medicaid Other | Admitting: Nurse Practitioner

## 2017-05-28 ENCOUNTER — Encounter: Payer: Self-pay | Admitting: Nurse Practitioner

## 2017-05-28 VITALS — BP 120/80 | HR 84 | Ht 71.0 in | Wt 134.0 lb

## 2017-05-28 DIAGNOSIS — I119 Hypertensive heart disease without heart failure: Secondary | ICD-10-CM

## 2017-05-28 DIAGNOSIS — Z720 Tobacco use: Secondary | ICD-10-CM | POA: Diagnosis not present

## 2017-05-28 DIAGNOSIS — E785 Hyperlipidemia, unspecified: Secondary | ICD-10-CM | POA: Diagnosis not present

## 2017-05-28 DIAGNOSIS — I2510 Atherosclerotic heart disease of native coronary artery without angina pectoris: Secondary | ICD-10-CM

## 2017-05-28 DIAGNOSIS — I739 Peripheral vascular disease, unspecified: Secondary | ICD-10-CM | POA: Diagnosis not present

## 2017-05-28 NOTE — Progress Notes (Signed)
Office Visit    Patient Name: Philip [name] Date of Encounter: 05/28/2017  Primary Care Provider:  Lorelee Market, MD Primary Cardiologist:  Kathlyn Sacramento, MD  Chief Complaint    57 year old male with a history of CAD status post prior CABG in May 2016, hypertension, hyperlipidemia, peripheral arterial disease, ongoing tobacco abuse, and lung cancer status post chemotherapy and radiation, who presents for clinic follow-up.  Past Medical History    Past Medical History:  Diagnosis Date  . Anemia   . CAD (coronary artery disease)    a. 08/2014 Inf STEMI/CABG x 3 (LIMA->LAD, VG->Diag, RIMA->RCA).  . DVT, recurrent, lower extremity, acute, left ([place]) 2016  . GIB (gastrointestinal bleeding)    a. In the setting of DAPT-->tolerating plavix only.  . Hyperlipidemia   . Hypertensive heart disease   . Lung cancer ([place])    a. s/p chemo/radiation.  Marland Kitchen PAD (peripheral artery disease) ([place])    a. 03/2015 Periph Angio: short occlusion of L pop w/ evidence of embolization into the DP->5.0x50 mm Inova self-expanding stent;  b. 04/08/2015 ABI: R 1.12, L 0.99; c. 01/2017 LE Duplex: bilat SFA obstructive dzs, patent L pop.  . Tobacco abuse   . Toe amputation status, left [facility]) 07/09/2016   2017   Past Surgical History:  Procedure Laterality Date  . CARDIAC CATHETERIZATION N/A 08/24/2014   Procedure: Left Heart Cath and Coronary Angiography;  Surgeon: Isaias Cowman, MD;  Location: [place] [facility];  Service: Cardiovascular;  Laterality: N/A;  . CARDIAC CATHETERIZATION N/A 08/24/2014   Procedure: Coronary Stent Intervention;  Surgeon: Isaias Cowman, MD;  Location: [place] [facility];  Service: Cardiovascular;  Laterality: N/A;  . CORONARY ARTERY BYPASS GRAFT N/A 08/25/2014   Procedure: CORONARY ARTERY BYPASS GRAFTING (CABG), ON PUMP, TIMES THREE, USING BILATERAL MAMMARY ARTERIES, RIGHT GREATER SAPHENOUS VEIN HARVESTED ENDOSCOPICALLY;  Surgeon: Melrose Nakayama, MD;   Location: [place];  Service: Open Heart Surgery;  Laterality: N/A;  . INCISION AND DRAINAGE ABSCESS Left 11/15/2016   Procedure: INCISION AND DRAINAGE ABSCESS OF LEFT WRIST;  Surgeon: Roseanne Kaufman, MD;  Location: [place];  Service: Orthopedics;  Laterality: Left;  . PERIPHERAL VASCULAR CATHETERIZATION N/A 03/30/2015   Procedure: Abdominal Aortogram w/Lower Extremity;  Surgeon: Wellington Hampshire, MD;  Location: [place] [facility];  Service: Cardiovascular;  Laterality: N/A;  . PORTA CATH INSERTION N/A 07/27/2016   Procedure: Glori Luis Cath Insertion;  Surgeon: Algernon Huxley, MD;  Location: [place] [facility];  Service: Cardiovascular;  Laterality: N/A;  . TEE WITHOUT CARDIOVERSION N/A 08/25/2014   Procedure: TRANSESOPHAGEAL ECHOCARDIOGRAM (TEE);  Surgeon: Melrose Nakayama, MD;  Location: [place];  Service: Open Heart Surgery;  Laterality: N/A;    Allergies  Allergies  Allergen Reactions  . Tylenol [Acetaminophen] Palpitations  . Amitriptyline Other (See Comments)    Burning sensation all over  . Contrast Media [Iodinated Diagnostic Agents] Other (See Comments)    Pt states that he got "knots behind his ears"  . Nsaids Other (See Comments)    Blood in stools  . Ibuprofen Other (See Comments) and Nausea Only    Blood in stools  . Tape Rash and Other (See Comments)    PLEASE USE COBAN WRAP; THE PATIENT'S SKIN TEARS WHEN BANDAGES ARE REMOVED!!    History of Present Illness    57 year old male with the above complex past medical history including inferior ST segment elevation myocardial infarction in May 2016 with catheterization revealing severe multivessel disease including a  90% proximal LAD stenosis, 95% mid LAD stenosis, 70% stenosis in the first diagonal, and 75% stenosis in the mid right coronary artery.  He was subsequently transferred to [facility] and underwent coronary artery bypass grafting x3 with a LIMA to the LAD, vein graft to the diagonal, and RIMA to the RCA.  Other history includes  peripheral arterial disease status post left popliteal stenting in December 2016, DVT in 2016, and GI bleed in the setting of dual antiplatelet therapy, ongoing tobacco abuse, and lung cancer.  He is status post chemotherapy and also completed radiation in January.  In the setting of chemotherapy and radiation, he has lost significant weight.  He is down about 30 pounds since this time last year, to 134 pounds.  He still experiences some nausea but his appetite has been improving.  He has not been having any chest pain or significant dyspnea.  Unfortunately, he continues to smoke about 5 cigarettes a week.  He has been having bilateral, left greater than right lower extremity claudication and pain, mostly in the thighs.  Sometimes he notes significant weakness in his legs when he is walking.  He has not fallen.  He denies any change to skin color or hair distribution.  He has some degree of chronic numbness in his feet as well as pain in the plantar surfaces of his feet.  He denies palpitations, PND, orthopnea, dizziness, syncope, edema, or early satiety.  Home Medications    Prior to Admission medications   Medication Sig Start Date End Date Taking? Authorizing Provider  atorvastatin (LIPITOR) 40 MG tablet TAKE 1 TABLET BY MOUTH DAILY 08/02/16  Yes Wellington Hampshire, MD  bacitracin 500 UNIT/GM ointment Apply 1 application topically 2 (two) times daily. 04/17/17  Yes Verlon Au, NP  benzonatate (TESSALON) 100 MG capsule TK 1 C PO TID PRN 03/16/17  Yes [provider]  buPROPion (WELLBUTRIN XL) 150 MG 24 hr tablet TK 1 T PO QD 01/18/17  Yes [provider]  Calcium Carb-Cholecalciferol (CALCIUM + D3 PO) Take 1 tablet by mouth 3 (three) times daily.   Yes [provider]  clopidogrel (PLAVIX) 75 MG tablet Take 1 tablet (75 mg total) by mouth daily. 11/14/16  Yes Wellington Hampshire, MD  diphenhydrAMINE (BENADRYL) 25 mg capsule Take 25 mg by mouth 2 (two) times daily as needed for  allergies.    Yes [provider]  FLOVENT HFA 220 MCG/ACT inhaler INL 2 PFS PO BID 02/01/17  Yes [provider]  fluconazole (DIFLUCAN) 100 MG tablet Take 1 tablet (100 mg total) daily by mouth. 02/27/17  Yes Chrystal, Eulas Post, MD  furosemide (LASIX) 20 MG tablet Take 20 mg daily by mouth. 02/21/17  Yes [provider]  lidocaine-prilocaine (EMLA) cream Apply to affected area once Patient taking differently: Apply 1 application topically See admin instructions. TO AFFECTED AREA AS DIRECTED 07/31/16  Yes Corcoran, Melissa C, MD  lisinopril (PRINIVIL,ZESTRIL) 5 MG tablet Take 1 tablet (5 mg total) by mouth daily. 12/27/16  Yes Theora Gianotti, NP  magnesium oxide (MAG-OX) 400 (241.3 Mg) MG tablet Take 1 tablet (400 mg total) by mouth 3 (three) times daily. 05/16/17  Yes Karen Kitchens, NP  metoprolol tartrate (LOPRESSOR) 25 MG tablet TAKE 1 TABLET BY MOUTH TWICE DAILY 12/05/16  Yes Wellington Hampshire, MD  ondansetron (ZOFRAN) 8 MG tablet TAKE 1 TABLET BY MOUTH EVERY 8 HOURS AS NEEDED FOR NAUSEA OR VOMITING 05/17/17  Yes Karen Kitchens, NP  pantoprazole (PROTONIX) 40 MG tablet TK 1 T PO QD 01/18/17  Yes [provider]  potassium chloride (K-DUR) 10 MEQ tablet Take 1 tablet (10 mEq total) by mouth daily. 05/16/17  Yes Karen Kitchens, NP  PROAIR HFA 108 [phone] Base) MCG/ACT inhaler INL 2 PFS PO Q 4 TO 6 H PRN 02/01/17  Yes [provider]  promethazine (PHENERGAN) 25 MG suppository Place 1 suppository (25 mg total) rectally every 6 (six) hours as needed for nausea or vomiting. 04/11/17  Yes Corcoran, Drue Second, MD  STIOLTO RESPIMAT 2.5-2.5 MCG/ACT AERS INL 2 PFS PO QD 02/04/17  Yes [provider]  Vitamin D, Ergocalciferol, (DRISDOL) 50000 units CAPS capsule TK ONE C PO Q WEEK 03/11/17  Yes [provider]    Review of Systems    Bilateral, left greater than right lower extremity claudication with bilateral foot pain and numbness as outlined  above.  He denies chest pain, palpitations, dyspnea, PND, orthopnea, dizziness, syncope, edema, or early satiety.  He has had some nausea which has been steadily improving.  All other systems reviewed and are otherwise negative except as noted above.  Physical Exam    VS:  BP 120/80 (BP Location: Left Arm, Patient Position: Sitting, Cuff Size: Normal)   Pulse 84   Ht 5\' 11"  (1.803 m)   Wt 134 lb (60.8 kg)   BMI 18.69 kg/m  , BMI Body mass index is 18.69 kg/m. GEN: Frail, in no acute distress.  HEENT: normal.  Neck: Supple, no JVD, carotid bruits, or masses. Cardiac: RRR, no murmurs, rubs, or gallops. No clubbing, cyanosis, edema.  Radials/DP/PT 2+ and equal bilaterally.  Respiratory:  Respirations regular and unlabored, clear to auscultation bilaterally. GI: Soft, nontender, nondistended, BS + x 4. MS: no deformity or atrophy. Skin: warm and dry, no rash. Neuro:  Strength and sensation are intact. Psych: Normal affect.  Accessory Clinical Findings    ECG -regular sinus rhythm, 84, no acute ST or T changes.  Assessment & Plan    1.  Peripheral arterial disease/bilateral lower extremity claudication: Patient has been having claudication when ambulating, with pain in both thighs, though more pronounced on the left.  Arterial duplex performed in October suggested obstructive disease in bilateral superficial femoral arteries.  Previously placed left popliteal stent appeared to be patent.  Previous recommendation for PV follow-up once he had completed chemotherapy and radiation, which he has now done.  I will arrange for short-term follow-up with Dr. Fletcher Anon to reconsider angiography.  Patient remains on Plavix therapy alone in the setting of prior history of GI bleed while on dual antiplatelet therapy.  He is also on a statin.  2.  Coronary artery disease: Status post prior inferior STEMI and CABG x3 in May 2016.  He has not been having any chest pain.  He remains on Plavix, statin,  beta-blocker, and ACE inhibitor.  3.  Hypertensive heart disease: Well-controlled on beta-blocker and ACE inhibitor.  4.  Hyperlipidemia: He remains on statin therapy.  He had normal LFTs in late January.  LDL in April was 112.  This will need follow-up when he is fasting.  May require addition of Zetia.  5.  Lung cancer: Followed closely by oncology.  No evidence of active cancer on most recent PET scan.    6.  Tobacco abuse: Still smoking a few cigarettes a week.  Complete cessation advised in the setting of all of the above.  7.  Disposition: I will try and get  him into see Dr. Fletcher Anon sooner rather than later for consideration of peripheral angiography and intervention.   Murray Hodgkins, NP 05/28/2017, 2:03 PM

## 2017-05-28 NOTE — Patient Instructions (Signed)
Medication Instructions:  Your physician recommends that you continue on your current medications as directed. Please refer to the Current Medication list given to you today.   Labwork: NONE  Testing/Procedures: NONE  Follow-Up: Your physician recommends that you schedule a follow-up appointment in: AT [place].    If you need a refill on your cardiac medications before your next appointment, please call your pharmacy.

## 2017-06-12 ENCOUNTER — Other Ambulatory Visit: Payer: Self-pay | Admitting: Urgent Care

## 2017-06-12 DIAGNOSIS — C3412 Malignant neoplasm of upper lobe, left bronchus or lung: Secondary | ICD-10-CM

## 2017-06-12 NOTE — Progress Notes (Signed)
[facility] day:  06/13/2017   Chief Complaint: Philip [name] is a 57 y.o. male with extensive stage small cell lung cancer who is seen for 1 month assessment.  HPI:  The patient was last seen in the medical oncology clinic on 05/16/2017.  At that time, patient complained of chronic pain in his lower legs.  Patient was being followed by vascular to discuss stent placement.  Additionally, he complained of chronic back pain.  Patient noted that the pain "had been going on 3 years before he ever knew he had cancer".  Patient complained of nausea and vomiting.  Weight was stable at 131 pounds.  He had been scheduled for a pain management consult on 04/24/2017, however patient canceled the appointment citing the fact that "they will not do anything for me because I had marijuana in my system".  WBC was 4900 with an [place] of 3300.  Hemoglobin 12.7, hematocrit 36.9, and platelets 221,000.  Magnesium was low at 1.4.  Patient was seen on 05/28/2017 by Murray Hodgkins, NP (cardiology).  At that time patient was complaining of continued claudication pain with ambulation.  Patient continued on anticoagulation using Plavix.  Short-term follow-up was scheduled with Dr. Fletcher Anon for consideration of peripheral angiography and intervention.  In the interim, patient is experiencing nausea and vomiting. He states, "I eat, but it comes right back up". Patient is drinking Ensure that he mixes with ice cream. Patient has lost 5 pounds since his last visit to the clinic. He continues on magnesium and potassium supplements at home.  Patient has pain in his legs. He states, "there are days that I don't get out of bed because I can't walk". Patient states, "I haven't heard anything from the pain clinic either". Patient complains of pain rated 7/10 in the clinic today.   Patient denies denies increased shortness of breath. He denies recent infections.    Past Medical History:   Diagnosis Date  . Anemia   . CAD (coronary artery disease)    a. 08/2014 Inf STEMI/CABG x 3 (LIMA->LAD, VG->Diag, RIMA->RCA).  . DVT, recurrent, lower extremity, acute, left ([place]) 2016  . GIB (gastrointestinal bleeding)    a. In the setting of DAPT-->tolerating plavix only.  . Hyperlipidemia   . Hypertensive heart disease   . Lung cancer ([place])    a. s/p chemo/radiation.  Marland Kitchen PAD (peripheral artery disease) ([place])    a. 03/2015 Periph Angio: short occlusion of L pop w/ evidence of embolization into the DP->5.0x50 mm Inova self-expanding stent;  b. 04/08/2015 ABI: R 1.12, L 0.99; c. 01/2017 LE Duplex: bilat SFA obstructive dzs, patent L pop.  . Tobacco abuse   . Toe amputation status, left [facility]) 07/09/2016   2017    Past Surgical History:  Procedure Laterality Date  . CARDIAC CATHETERIZATION N/A 08/24/2014   Procedure: Left Heart Cath and Coronary Angiography;  Surgeon: Isaias Cowman, MD;  Location: [facility];  Service: Cardiovascular;  Laterality: N/A;  . CARDIAC CATHETERIZATION N/A 08/24/2014   Procedure: Coronary Stent Intervention;  Surgeon: Isaias Cowman, MD;  Location: [facility];  Service: Cardiovascular;  Laterality: N/A;  . CORONARY ARTERY BYPASS GRAFT N/A 08/25/2014   Procedure: CORONARY ARTERY BYPASS GRAFTING (CABG), ON PUMP, TIMES THREE, USING BILATERAL MAMMARY ARTERIES, RIGHT GREATER SAPHENOUS VEIN HARVESTED ENDOSCOPICALLY;  Surgeon: Melrose Nakayama, MD;  Location: [facility];  Service: Open Heart Surgery;  Laterality: N/A;  . INCISION AND DRAINAGE ABSCESS Left  11/15/2016   Procedure: INCISION AND DRAINAGE ABSCESS OF LEFT WRIST;  Surgeon: Roseanne Kaufman, MD;  Location: [facility];  Service: Orthopedics;  Laterality: Left;  . PERIPHERAL VASCULAR CATHETERIZATION N/A 03/30/2015   Procedure: Abdominal Aortogram w/Lower Extremity;  Surgeon: Wellington Hampshire, MD;  Location: [facility];  Service: Cardiovascular;  Laterality: N/A;  . PORTA CATH INSERTION  N/A 07/27/2016   Procedure: Glori Luis Cath Insertion;  Surgeon: Algernon Huxley, MD;  Location: [facility];  Service: Cardiovascular;  Laterality: N/A;  . TEE WITHOUT CARDIOVERSION N/A 08/25/2014   Procedure: TRANSESOPHAGEAL ECHOCARDIOGRAM (TEE);  Surgeon: Melrose Nakayama, MD;  Location: [facility];  Service: Open Heart Surgery;  Laterality: N/A;    Family History  Problem Relation Age of Onset  . Hypertension Father   . Lung cancer Father   . Heart attack Mother 43       STENT  . Hyperlipidemia Mother   . Heart attack Brother   . Heart attack Brother   . Breast cancer Sister   . Heart attack Sister   . Colon cancer Paternal Grandfather     Social History:  reports that he has been smoking cigarettes.  He has been smoking about 0.50 packs per day. he has never used smokeless tobacco. He reports that he drinks alcohol. He reports that he does not use drugs.  The patient smokes 1/2 pack of cigarettes/day (previously 2 1/2 ppd).  He started smoking in his teens.  He denies any exposure to radiation or toxins.  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  He lives in [place].  His wife is named Publishing copy.  He is accompanied by his wife today.  Allergies:  Allergies  Allergen Reactions  . Tylenol [Acetaminophen] Palpitations  . Amitriptyline Other (See Comments)    Burning sensation all over  . Contrast Media [Iodinated Diagnostic Agents] Other (See Comments)    Pt states that he got "knots behind his ears"  . Nsaids Other (See Comments)    Blood in stools  . Ibuprofen Other (See Comments) and Nausea Only    Blood in stools  . Tape Rash and Other (See Comments)    PLEASE USE COBAN WRAP; THE PATIENT'S SKIN TEARS WHEN BANDAGES ARE REMOVED!!    Current Medications: Current Outpatient Medications  Medication Sig Dispense Refill  . atorvastatin (LIPITOR) 40 MG tablet TAKE 1 TABLET BY MOUTH DAILY 90 tablet 3  . benzonatate (TESSALON) 100 MG capsule TK 1 C PO TID PRN  2  .  buPROPion (WELLBUTRIN XL) 150 MG 24 hr tablet TK 1 T PO QD  5  . Calcium Carb-Cholecalciferol (CALCIUM + D3 PO) Take 1 tablet by mouth 3 (three) times daily.    . clopidogrel (PLAVIX) 75 MG tablet Take 1 tablet (75 mg total) by mouth daily. 90 tablet 3  . diphenhydrAMINE (BENADRYL) 25 mg capsule Take 25 mg by mouth 2 (two) times daily as needed for allergies.     Marland Kitchen FLOVENT HFA 220 MCG/ACT inhaler INL 2 PFS PO BID  5  . furosemide (LASIX) 20 MG tablet Take 20 mg daily by mouth.  1  . lidocaine-prilocaine (EMLA) cream Apply to affected area once (Patient taking differently: Apply 1 application topically See admin instructions. TO AFFECTED AREA AS DIRECTED) 30 g 3  . lisinopril (PRINIVIL,ZESTRIL) 5 MG tablet Take 1 tablet (5 mg total) by mouth daily. 90 tablet 1  . magnesium oxide (MAG-OX) 400 (241.3 Mg) MG  tablet Take 1 tablet (400 mg total) by mouth 3 (three) times daily. 90 tablet 3  . metoprolol tartrate (LOPRESSOR) 25 MG tablet TAKE 1 TABLET BY MOUTH TWICE DAILY 180 tablet 3  . ondansetron (ZOFRAN) 8 MG tablet TAKE 1 TABLET BY MOUTH EVERY 8 HOURS AS NEEDED FOR NAUSEA OR VOMITING 30 tablet 0  . pantoprazole (PROTONIX) 40 MG tablet TK 1 T PO QD  5  . potassium chloride (K-DUR) 10 MEQ tablet Take 1 tablet (10 mEq total) by mouth daily. 90 tablet 1  . PROAIR HFA 108 (90 Base) MCG/ACT inhaler INL 2 PFS PO Q 4 TO 6 H PRN  5  . promethazine (PHENERGAN) 25 MG suppository Place 1 suppository (25 mg total) rectally every 6 (six) hours as needed for nausea or vomiting. 12 each 5  . STIOLTO RESPIMAT 2.5-2.5 MCG/ACT AERS INL 2 PFS PO QD  5  . Vitamin D, Ergocalciferol, (DRISDOL) 50000 units CAPS capsule TK ONE C PO Q WEEK  5  . fluconazole (DIFLUCAN) 100 MG tablet Take 1 tablet (100 mg total) daily by mouth. (Patient not taking: Reported on 06/13/2017) 7 tablet 3   No current facility-administered medications for this visit.    Facility-Administered Medications Ordered in Other Visits  Medication Dose  Route Frequency Provider Last Rate Last Dose  . heparin lock flush 100 unit/mL  500 Units Intravenous Once Corcoran, Melissa C, MD      . heparin lock flush 100 unit/mL  500 Units Intravenous Once Lloyd Huger, MD      . sodium chloride flush (NS) 0.9 % injection 10 mL  10 mL Intravenous PRN Lloyd Huger, MD   10 mL at 06/13/17 0920    Review of Systems:  GENERAL:  Feels "weak and tired". No fevers or sweats.  Weight down 5  pounds.  PERFORMANCE STATUS (ECOG):  1 HEENT:  No visual changes, runny nose, sore throat, mouth sores or tenderness. Lungs: No shortness of breath or cough.  No hemoptysis. Cardiac:  No chest pain, palpitations, orthopnea, or PND. GI:  Eats small portions.  Nausea and vomiting (see HPI).  No diarrhea, constipation, melena or hematochezia.   GU:  No urgency, frequency, dysuria, or hematuria. Musculoskeletal:  Low back pain (chronic).  Upper back pain.  No joint pain.  No muscle tenderness. Extremities:  Lower extremity pain secondary to peripheral vascular disease (see HPI).  No swelling. Skin:  No rashes or skin changes. Neuro:  No headache, numbness or weakness, balance or coordination issues. Endocrine:  No diabetes, thyroid issues, hot flashes or night sweats. Psych:  No mood changes, depression or anxiety. Pain:  Upper back pain 7 out of 10 (chronic before diagnosis of cancer). Review of systems:  All other systems reviewed and found to be negative.  Physical Exam: Blood pressure 107/73, pulse 86, temperature 97.9 F (36.6 C), temperature source Tympanic, resp. rate 18, weight 126 lb 2 oz (57.2 kg), SpO2 96 %. GENERAL:  Thin gentleman sitting comfortably in the exam room in no acute distress.  He has a cane at his side. MENTAL STATUS: Alert and oriented to person, place and time. HEAD:Wearing a cap.  Normocephalic, atraumatic, face symmetric.  Cushingoid features. EYES:[place]. Pupils equal round and reactive to light and accomodation. No  conjunctivitis or scleral icterus. WER:[id number] clear without lesion. Tonguenormal. Poor dentition. Mucous membranes moist. RESPIRATORY:Clear to auscultationwithout rales, wheezes or rhonchi. CARDIOVASCULAR:Regular rate andrhythmwithout murmur, rub or gallop. ABDOMEN:Soft, non-tender, with active bowel sounds, and no hepatosplenomegaly.  No masses. SKIN: No rashes or ulcers. EXTREMITIES: No lower extremity edema, no skin discoloration or tenderness. No palpable cords. Left 5th toe amputated.  Capillary refill 1-2 seconds. LYMPHNODES: No palpable cervical, supraclavicular, axillary or inguinal adenopathy  NEUROLOGICAL: Unremarkable. PSYCH: Appropriate.   Infusion on 06/13/2017  Component Date Value Ref Range Status  . WBC 06/13/2017 4.7  3.8 - 10.6 K/uL Final  . RBC 06/13/2017 3.70* 4.40 - 5.90 MIL/uL Final  . Hemoglobin 06/13/2017 13.0  13.0 - 18.0 g/dL Final   RESULT REPEATED AND VERIFIED  . HCT 06/13/2017 35.1* 40.0 - 52.0 % Final   RESULT REPEATED AND VERIFIED  . MCV 06/13/2017 94.9  80.0 - 100.0 fL Final  . MCH 06/13/2017 35.0* 26.0 - 34.0 pg Final  . MCHC 06/13/2017 36.9* 32.0 - 36.0 g/dL Final  . RDW 06/13/2017 14.8* 11.5 - 14.5 % Final  . Platelets 06/13/2017 187  150 - 440 K/uL Final  . Neutrophils Relative % 06/13/2017 69  % Final  . Neutro Abs 06/13/2017 3.2  1.4 - 6.5 K/uL Final  . Lymphocytes Relative 06/13/2017 20  % Final  . Lymphs Abs 06/13/2017 0.9* 1.0 - 3.6 K/uL Final  . Monocytes Relative 06/13/2017 10  % Final  . Monocytes Absolute 06/13/2017 0.5  0.2 - 1.0 K/uL Final  . Eosinophils Relative 06/13/2017 0  % Final  . Eosinophils Absolute 06/13/2017 0.0  0 - 0.7 K/uL Final  . Basophils Relative 06/13/2017 1  % Final  . Basophils Absolute 06/13/2017 0.1  0 - 0.1 K/uL Final   Performed at [facility], [address], [place], [place] [address]  . Magnesium 06/13/2017 1.4* 1.7 - 2.4 mg/dL Final   Performed at [facility], [address] [place], [place] [address]  . Sodium 06/13/2017 130* 135 - 145 mmol/L Final  . Potassium 06/13/2017 2.8* 3.5 - 5.1 mmol/L Final  . Chloride 06/13/2017 94* 101 - 111 mmol/L Final  . CO2 06/13/2017 25  22 - 32 mmol/L Final  . Glucose, Bld 06/13/2017 89  65 - 99 mg/dL Final  . BUN 06/13/2017 8  6 - 20 mg/dL Final  . Creatinine, Ser 06/13/2017 0.79  0.61 - 1.24 mg/dL Final  . Calcium 06/13/2017 8.3* 8.9 - 10.3 mg/dL Final  . GFR calc non Af Amer 06/13/2017 >60  >60 mL/min Final  . GFR calc Af Amer 06/13/2017 >60  >60 mL/min Final   Comment: (NOTE) The eGFR has been calculated using the CKD EPI equation. This calculation has not been validated in all clinical situations. eGFR's persistently <60 mL/min signify possible Chronic Kidney Disease.   Georgiann Hahn gap 06/13/2017 11  5 - 15 Final   Performed at [facility], [address] [place],  [address]    Assessment:  Philip [name] is a 57 y.o. male with extensive stage small cell lung cancer.  He presented with a 2 year history of night sweats and a 36 pound weight loss in 6 months.  Right supraclavicular biopsy on 07/19/2016 revealed small cell carcinoma of the lung.  CEA was 1.7 on 07/31/2016.  Chest CT on 06/28/2016 revealed extensive adenopathy.  There were multiple lymph nodes descending thoracic aorta, largest measuring 2.1 x 1.7 cm.  There was adenopathy in the aortopulmonary window with the largest confluence of lymph nodes measuring 4 x 2.6 cm. There was hilar adenopathy on the left measuring 2 x 1.9 cm. There were multiple enlarged lymph nodes in the pretracheal region, largest  1.7 x 1.7 cm.  There were lymph nodes to the left of the carina measuring 2.8 x 1.9 cm. There was a subcarinal lymph node measuring 2.4 x 1.6 cm. There was a 4 x 3 mm nodular opacity in the anterior segment of the left upper lobe.    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  There  was nodal metastasis within the chest and supraclavicular/low jugular regions.  There was multifocal osseous metastasis.  There was possibly a pathologic fracture involving the posteromedial right tenth rib.  There was new left lower lobe opacity with mild hypermetabolism, suspicious for infection or postobstructive pneumonitis.  He completed a course of Levaquin for apost-obstructive pneumonia.  He experienced transient positional left facial burning.  Head MRI on 07/26/2016 revealed no evidence of brain metastasis. There was punctate DWI hyperintensity along a high right frontal gyrus, possible recent infarct.  There was a small occipital bone metastasis.  Head MRA on 07/27/2016 revealed normal large and medium sized vessels.  Bilateral carotid duplex on 07/27/2016 revealed <50% stenosis in the right and left internal carotis arteries.  Echo on 07/27/2016 revealed an EF of 45-50% without cardiac source of emboli.  He received 4 cycles of carboplatin and etoposide (07/31/2016 - 10/02/2016) with OnPro Neulasta support.  Platelet nadir was 40,000 on day 16 of cycle #2 and 23,000 on day 15 of cycle #3.  He received 2 units of PRBC with cycle #3 and 1 unit with cycle #4.  He received Xgeva on 08/13/2016 (last 10/30/2016).  Chest CT on 09/11/2016 revealed a marked response to therapy.  There was near complete resolution of left suprahilar mass and thoracic adenopathy.  There was interval sclerosis indicative of healing site of osseous metastasis.  Thoracic spine MRI on 10/22/2016 reveals sclerotic enhancing lesion at T7 vertebral body on the left compatible with metastasis. There was a healing fracture the right medial 10th rib.  PET scan on 11/12/2016 revealed a new 10 mm hypermetabolic left axillary lymph node (SUV 3.6) due to a left hand abscess.  There were no other sites of metabolically active disease.  He completed a course of thoracic spine radiation (3000 cGy in 10 fractions) on 01/11/2017.  He  received PCI (3000 cGy in 15 fractions) from 01/28/2017 - 02/15/2017.  Chest, abdomen, and pelvic CT on 03/18/2017 revealed new areas of irregular ground-glass in the medial aspects of the right upper and right lower lobes as well as somewhat amorphous ground-glass in the lingula and left lower lobe. Time interval favored infectious or inflammatory lesions.  Osseous metastatic disease was stable.  There was resolved or resected left axillary lymph node.  Bone scan on 04/10/2017 revealed multiple foci of skeletal metastasis within the ribs, pelvis, and RIGHT clavicle.  Multiple areas of activity corresponding to sclerotic lesions on CT and non-hypermetabolic lesions on PET scan.  PET scan on 04/26/2017 revealed bandlike densities medially in the right upper lobe and medially in the right lower lobe were new from 11/12/2016 but were present on 03/18/2017, and had faint hypermetabolic activity. Radiation pneumonitis could have this appearance.  The morphology would be unusual for malignancy but surveillance was warranted.  There were scattered stable sclerotic bony lesions that were not hypermetabolic.  There was persistent accentuated activity in the vicinity of the anus may be physiologic but was technically nonspecific.  He has chemotherapy induced anemia.  Anemia work-up on 09/11/2016 revealed the following normal labs:  Ferritin (219), iron sat (28%), TIBC (227),  B12 (3055), and folate (6.8).  He has a history of left lower extremity DVT a few months after his cardiac surgery in 08/2014.  He has peripheral vascular disease s/p stent placement in 03/2015.  He is on a Plavix.  He has not had a colonoscopy in 30 years.  He was ineligible for the AbbVie clinical trial (M16-298) secondary to his left hand abscess.  He underwent surgical debridement of a left hand abscess on 11/15/2016.  Wound cultures grew MRSA.  He was treated with emperic IV Vancomycin then Bactrim.   Symptomatically, he has nausea and  vomiting. He is taking his prescribed antiemetics, however he continues to have issues. Patient has lost 5 pounds since 04/2017. He has significant chronic back pain (present 3 years ago before his diagnosis of cancer). WBC is 4700 with an [place] of 3200.  Potassium is 2.8.  Magnesium  Is 1.4.  Plan: 1.  Labs today:  CBC with diff, BMP, Mg. 2.  Discuss low magnesium of 1.4. Will replace with 2 grams of intravenous Mg today. 3.  Discuss decreased oral intake. Will give patient 1L NS today. 4.  Discuss HYPOkalemia. Patient's potassium 2.8 today despite oral supplementation. Will give 20 mEq IV replacement today. Patient to increase oral potassium to 20 mEq daily at home.  5.  Discuss weight loss. PET scan revealed hypermetabolic activity in the area of the anus. Will refer to GI for further evaluation.  6.  Discuss pain management. Patient has not followed up with pain management clinic. Will reschedule consult.  7.  Discuss inability to eat. Will schedule UGI with barium to further assess. Discuss possible gastroparesis. If confirmed, will consider prokinetic medication (metoclopromide) to help.  8.  RTC in 1 week for MD assessment, labs (BMP, Mg), and +/- IV Mg.   Honor Loh, NP  06/13/2017, 10:06 AM  I saw and evaluated the patient, participating in the key portions of the service and reviewing pertinent diagnostic studies and records.  I reviewed the nurse practitioner's note and agree with the findings and the plan.  The assessment and plan were discussed with the patient. Multiple questions were asked by the patient and answered.   Nolon Stalls, MD 06/13/2017,10:06 AM

## 2017-06-13 ENCOUNTER — Inpatient Hospital Stay (HOSPITAL_BASED_OUTPATIENT_CLINIC_OR_DEPARTMENT_OTHER): Payer: Medicaid Other | Admitting: Hematology and Oncology

## 2017-06-13 ENCOUNTER — Inpatient Hospital Stay: Payer: Medicaid Other

## 2017-06-13 ENCOUNTER — Encounter: Payer: Self-pay | Admitting: Hematology and Oncology

## 2017-06-13 ENCOUNTER — Inpatient Hospital Stay: Payer: Medicaid Other | Attending: Hematology and Oncology

## 2017-06-13 VITALS — BP 107/73 | HR 86 | Temp 97.9°F | Resp 18 | Wt 126.1 lb

## 2017-06-13 DIAGNOSIS — Z923 Personal history of irradiation: Secondary | ICD-10-CM

## 2017-06-13 DIAGNOSIS — R5383 Other fatigue: Secondary | ICD-10-CM | POA: Insufficient documentation

## 2017-06-13 DIAGNOSIS — M549 Dorsalgia, unspecified: Secondary | ICD-10-CM | POA: Diagnosis not present

## 2017-06-13 DIAGNOSIS — I252 Old myocardial infarction: Secondary | ICD-10-CM | POA: Insufficient documentation

## 2017-06-13 DIAGNOSIS — R531 Weakness: Secondary | ICD-10-CM | POA: Diagnosis not present

## 2017-06-13 DIAGNOSIS — Z9221 Personal history of antineoplastic chemotherapy: Secondary | ICD-10-CM | POA: Insufficient documentation

## 2017-06-13 DIAGNOSIS — I509 Heart failure, unspecified: Secondary | ICD-10-CM | POA: Diagnosis not present

## 2017-06-13 DIAGNOSIS — C3412 Malignant neoplasm of upper lobe, left bronchus or lung: Secondary | ICD-10-CM | POA: Diagnosis present

## 2017-06-13 DIAGNOSIS — I739 Peripheral vascular disease, unspecified: Secondary | ICD-10-CM

## 2017-06-13 DIAGNOSIS — G8929 Other chronic pain: Secondary | ICD-10-CM

## 2017-06-13 DIAGNOSIS — T451X5S Adverse effect of antineoplastic and immunosuppressive drugs, sequela: Secondary | ICD-10-CM | POA: Diagnosis not present

## 2017-06-13 DIAGNOSIS — I119 Hypertensive heart disease without heart failure: Secondary | ICD-10-CM | POA: Diagnosis not present

## 2017-06-13 DIAGNOSIS — G894 Chronic pain syndrome: Secondary | ICD-10-CM

## 2017-06-13 DIAGNOSIS — D6481 Anemia due to antineoplastic chemotherapy: Secondary | ICD-10-CM

## 2017-06-13 DIAGNOSIS — F17210 Nicotine dependence, cigarettes, uncomplicated: Secondary | ICD-10-CM | POA: Insufficient documentation

## 2017-06-13 DIAGNOSIS — Z86718 Personal history of other venous thrombosis and embolism: Secondary | ICD-10-CM

## 2017-06-13 DIAGNOSIS — C771 Secondary and unspecified malignant neoplasm of intrathoracic lymph nodes: Secondary | ICD-10-CM

## 2017-06-13 DIAGNOSIS — E876 Hypokalemia: Secondary | ICD-10-CM

## 2017-06-13 DIAGNOSIS — Z8701 Personal history of pneumonia (recurrent): Secondary | ICD-10-CM

## 2017-06-13 DIAGNOSIS — C7951 Secondary malignant neoplasm of bone: Secondary | ICD-10-CM | POA: Diagnosis not present

## 2017-06-13 DIAGNOSIS — I2510 Atherosclerotic heart disease of native coronary artery without angina pectoris: Secondary | ICD-10-CM

## 2017-06-13 DIAGNOSIS — Z7189 Other specified counseling: Secondary | ICD-10-CM

## 2017-06-13 DIAGNOSIS — R634 Abnormal weight loss: Secondary | ICD-10-CM | POA: Insufficient documentation

## 2017-06-13 DIAGNOSIS — Z7901 Long term (current) use of anticoagulants: Secondary | ICD-10-CM

## 2017-06-13 DIAGNOSIS — C3490 Malignant neoplasm of unspecified part of unspecified bronchus or lung: Secondary | ICD-10-CM

## 2017-06-13 DIAGNOSIS — E860 Dehydration: Secondary | ICD-10-CM

## 2017-06-13 DIAGNOSIS — E785 Hyperlipidemia, unspecified: Secondary | ICD-10-CM | POA: Diagnosis not present

## 2017-06-13 DIAGNOSIS — Z79899 Other long term (current) drug therapy: Secondary | ICD-10-CM

## 2017-06-13 DIAGNOSIS — R112 Nausea with vomiting, unspecified: Secondary | ICD-10-CM

## 2017-06-13 DIAGNOSIS — Z7902 Long term (current) use of antithrombotics/antiplatelets: Secondary | ICD-10-CM | POA: Insufficient documentation

## 2017-06-13 DIAGNOSIS — Z951 Presence of aortocoronary bypass graft: Secondary | ICD-10-CM | POA: Insufficient documentation

## 2017-06-13 LAB — BASIC METABOLIC PANEL
Anion gap: 11 (ref 5–15)
BUN: 8 mg/dL (ref 6–20)
CO2: 25 mmol/L (ref 22–32)
Calcium: 8.3 mg/dL — ABNORMAL LOW (ref 8.9–10.3)
Chloride: 94 mmol/L — ABNORMAL LOW (ref 101–111)
Creatinine, Ser: 0.79 mg/dL (ref 0.61–1.24)
GFR calc Af Amer: >60 mL/min (ref >60)
GFR calc non Af Amer: >60 mL/min (ref >60)
Glucose, Bld: 89 mg/dL (ref 65–99)
Potassium: 2.8 mmol/L — ABNORMAL LOW (ref 3.5–5.1)
Sodium: 130 mmol/L — ABNORMAL LOW (ref 135–145)

## 2017-06-13 LAB — CBC WITH DIFFERENTIAL/PLATELET
Basophils Absolute: 0.1 10*3/uL (ref 0–0.1)
Basophils Relative: 1 %
Eosinophils Absolute: 0 10*3/uL (ref 0–0.7)
Eosinophils Relative: 0 %
HEMATOCRIT: 35.1 % — AB (ref 40.0–52.0)
Hemoglobin: 13 g/dL (ref 13.0–18.0)
LYMPHS ABS: 0.9 10*3/uL — AB (ref 1.0–3.6)
LYMPHS PCT: 20 %
MCH: 35 pg — ABNORMAL HIGH (ref 26.0–34.0)
MCHC: 36.9 g/dL — AB (ref 32.0–36.0)
MCV: 94.9 fL (ref 80.0–100.0)
MONO ABS: 0.5 10*3/uL (ref 0.2–1.0)
MONOS PCT: 10 %
NEUTROS ABS: 3.2 10*3/uL (ref 1.4–6.5)
Neutrophils Relative %: 69 %
Platelets: 187 10*3/uL (ref 150–440)
RBC: 3.7 MIL/uL — ABNORMAL LOW (ref 4.40–5.90)
RDW: 14.8 % — AB (ref 11.5–14.5)
WBC: 4.7 10*3/uL (ref 3.8–10.6)

## 2017-06-13 LAB — MAGNESIUM: Magnesium: 1.4 mg/dL — ABNORMAL LOW (ref 1.7–2.4)

## 2017-06-13 MED ORDER — SODIUM CHLORIDE 0.9 % IV SOLN
Freq: Once | INTRAVENOUS | Status: AC
  Administered 2017-06-13: 11:00:00 via INTRAVENOUS
  Filled 2017-06-13: qty 1000

## 2017-06-13 MED ORDER — HEPARIN SOD (PORK) LOCK FLUSH 100 UNIT/ML IV SOLN
500.0000 [IU] | Freq: Once | INTRAVENOUS | Status: AC
  Administered 2017-06-13: 500 [IU] via INTRAVENOUS
  Filled 2017-06-13: qty 5

## 2017-06-13 MED ORDER — SODIUM CHLORIDE 0.9% FLUSH
10.0000 mL | INTRAVENOUS | Status: DC | PRN
  Administered 2017-06-13: 10 mL via INTRAVENOUS
  Filled 2017-06-13: qty 10

## 2017-06-13 NOTE — Progress Notes (Signed)
Patient states he has nausea all the time.  Pain in back and legs.  Appetite decreased.  Drinks ensure.  Patient has lost 8# since last visit. States he has been throwing up for past 3 months.  States he is having problems with balance.  Has a cardiology appt the end of March.

## 2017-06-13 NOTE — Progress Notes (Signed)
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    Met with patient and wife today in infusion for IV fluids, K and Mag.  Patient reports that he is hungry and tries to eat but everything comes right back up.  Reports yesterday he was able to keep chicken tender down.  Reports that he is taking nausea medications.  Also reports that he is drinking oral nutrition supplement shakes sometimes mixed with ice cream. Reports that sometimes they stay down and sometimes not.  Reports at times water does not stay down.    Noted referral to GI, PET results and planning Upper GI with barium. Also noted possibility of starting reglan pending study results.   Medications: reviewed   Labs: Na 130, K 2.8, Mag 1.4  Anthropometrics:   Weight has decreased to 126 lb today from 132 lb 11.5 oz   NUTRITION DIAGNOSIS: Malnutrition continues   MALNUTRITION DIAGNOSIS: severe malnutrition continues   INTERVENTION:   Will await outcome of GI evaluation and recommendations.   Encouraged patient to eat small frequent meals.   Encouraged use of oral nutrition supplements as tolerated.  Patient given 1st case of ensure plus today.    MONITORING, EVALUATION, GOAL: weight trends, intake   NEXT VISIT: to be determined  Marshal Schrecengost B. Zenia Resides, [place], [place] Registered Dietitian [phone] (pager)

## 2017-06-14 ENCOUNTER — Other Ambulatory Visit: Payer: Self-pay | Admitting: Urgent Care

## 2017-06-14 ENCOUNTER — Ambulatory Visit
Admission: RE | Admit: 2017-06-14 | Discharge: 2017-06-14 | Disposition: A | Discharge status: Home / Self Care | Payer: Medicaid Other | Source: Ambulatory Visit | Attending: Urgent Care | Admitting: Urgent Care

## 2017-06-14 DIAGNOSIS — R112 Nausea with vomiting, unspecified: Secondary | ICD-10-CM

## 2017-06-17 ENCOUNTER — Ambulatory Visit: Payer: Medicaid Other | Admitting: Gastroenterology

## 2017-06-19 ENCOUNTER — Other Ambulatory Visit: Payer: Self-pay | Admitting: Nurse Practitioner

## 2017-06-21 ENCOUNTER — Inpatient Hospital Stay: Payer: Medicaid Other | Attending: Hematology and Oncology

## 2017-06-21 ENCOUNTER — Other Ambulatory Visit: Payer: Self-pay

## 2017-06-21 ENCOUNTER — Encounter: Payer: Self-pay | Admitting: Hematology and Oncology

## 2017-06-21 ENCOUNTER — Emergency Department: Payer: Medicaid Other

## 2017-06-21 ENCOUNTER — Inpatient Hospital Stay (HOSPITAL_BASED_OUTPATIENT_CLINIC_OR_DEPARTMENT_OTHER): Payer: Medicaid Other | Admitting: Hematology and Oncology

## 2017-06-21 ENCOUNTER — Emergency Department
Admission: EM | Admit: 2017-06-21 | Discharge: 2017-06-21 | Disposition: A | Discharge status: Home / Self Care | Payer: Medicaid Other | Attending: Emergency Medicine | Admitting: Emergency Medicine

## 2017-06-21 ENCOUNTER — Inpatient Hospital Stay: Payer: Medicaid Other

## 2017-06-21 VITALS — BP 124/86 | HR 84 | Temp 97.7°F | Resp 18 | Wt 127.5 lb

## 2017-06-21 DIAGNOSIS — R531 Weakness: Secondary | ICD-10-CM | POA: Insufficient documentation

## 2017-06-21 DIAGNOSIS — W19XXXA Unspecified fall, initial encounter: Secondary | ICD-10-CM

## 2017-06-21 DIAGNOSIS — R112 Nausea with vomiting, unspecified: Secondary | ICD-10-CM

## 2017-06-21 DIAGNOSIS — I119 Hypertensive heart disease without heart failure: Secondary | ICD-10-CM | POA: Insufficient documentation

## 2017-06-21 DIAGNOSIS — E876 Hypokalemia: Secondary | ICD-10-CM

## 2017-06-21 DIAGNOSIS — R42 Dizziness and giddiness: Secondary | ICD-10-CM

## 2017-06-21 DIAGNOSIS — D6481 Anemia due to antineoplastic chemotherapy: Secondary | ICD-10-CM

## 2017-06-21 DIAGNOSIS — Y999 Unspecified external cause status: Secondary | ICD-10-CM | POA: Diagnosis not present

## 2017-06-21 DIAGNOSIS — Z800 Family history of malignant neoplasm of digestive organs: Secondary | ICD-10-CM

## 2017-06-21 DIAGNOSIS — Z8614 Personal history of Methicillin resistant Staphylococcus aureus infection: Secondary | ICD-10-CM | POA: Insufficient documentation

## 2017-06-21 DIAGNOSIS — Z85118 Personal history of other malignant neoplasm of bronchus and lung: Secondary | ICD-10-CM | POA: Diagnosis not present

## 2017-06-21 DIAGNOSIS — R630 Anorexia: Secondary | ICD-10-CM | POA: Diagnosis not present

## 2017-06-21 DIAGNOSIS — Z9221 Personal history of antineoplastic chemotherapy: Secondary | ICD-10-CM

## 2017-06-21 DIAGNOSIS — G8929 Other chronic pain: Secondary | ICD-10-CM | POA: Insufficient documentation

## 2017-06-21 DIAGNOSIS — Z951 Presence of aortocoronary bypass graft: Secondary | ICD-10-CM | POA: Insufficient documentation

## 2017-06-21 DIAGNOSIS — I739 Peripheral vascular disease, unspecified: Secondary | ICD-10-CM

## 2017-06-21 DIAGNOSIS — F17210 Nicotine dependence, cigarettes, uncomplicated: Secondary | ICD-10-CM

## 2017-06-21 DIAGNOSIS — M545 Low back pain: Secondary | ICD-10-CM

## 2017-06-21 DIAGNOSIS — S40022S Contusion of left upper arm, sequela: Secondary | ICD-10-CM | POA: Diagnosis not present

## 2017-06-21 DIAGNOSIS — R519 Headache, unspecified: Secondary | ICD-10-CM

## 2017-06-21 DIAGNOSIS — I2510 Atherosclerotic heart disease of native coronary artery without angina pectoris: Secondary | ICD-10-CM | POA: Insufficient documentation

## 2017-06-21 DIAGNOSIS — T451X5S Adverse effect of antineoplastic and immunosuppressive drugs, sequela: Secondary | ICD-10-CM | POA: Diagnosis not present

## 2017-06-21 DIAGNOSIS — Z7902 Long term (current) use of antithrombotics/antiplatelets: Secondary | ICD-10-CM | POA: Diagnosis not present

## 2017-06-21 DIAGNOSIS — R51 Headache: Secondary | ICD-10-CM

## 2017-06-21 DIAGNOSIS — Z79899 Other long term (current) drug therapy: Secondary | ICD-10-CM | POA: Diagnosis not present

## 2017-06-21 DIAGNOSIS — C3490 Malignant neoplasm of unspecified part of unspecified bronchus or lung: Secondary | ICD-10-CM

## 2017-06-21 DIAGNOSIS — W19XXXS Unspecified fall, sequela: Secondary | ICD-10-CM | POA: Diagnosis not present

## 2017-06-21 DIAGNOSIS — Z86718 Personal history of other venous thrombosis and embolism: Secondary | ICD-10-CM | POA: Insufficient documentation

## 2017-06-21 DIAGNOSIS — Z7189 Other specified counseling: Secondary | ICD-10-CM

## 2017-06-21 DIAGNOSIS — C771 Secondary and unspecified malignant neoplasm of intrathoracic lymph nodes: Secondary | ICD-10-CM | POA: Diagnosis not present

## 2017-06-21 DIAGNOSIS — C3412 Malignant neoplasm of upper lobe, left bronchus or lung: Secondary | ICD-10-CM | POA: Diagnosis not present

## 2017-06-21 DIAGNOSIS — Z923 Personal history of irradiation: Secondary | ICD-10-CM | POA: Insufficient documentation

## 2017-06-21 DIAGNOSIS — E785 Hyperlipidemia, unspecified: Secondary | ICD-10-CM

## 2017-06-21 DIAGNOSIS — M542 Cervicalgia: Secondary | ICD-10-CM | POA: Insufficient documentation

## 2017-06-21 DIAGNOSIS — C7951 Secondary malignant neoplasm of bone: Secondary | ICD-10-CM

## 2017-06-21 DIAGNOSIS — S0990XA Unspecified injury of head, initial encounter: Secondary | ICD-10-CM | POA: Insufficient documentation

## 2017-06-21 DIAGNOSIS — M79602 Pain in left arm: Secondary | ICD-10-CM

## 2017-06-21 DIAGNOSIS — R55 Syncope and collapse: Secondary | ICD-10-CM | POA: Diagnosis not present

## 2017-06-21 DIAGNOSIS — Y9389 Activity, other specified: Secondary | ICD-10-CM | POA: Insufficient documentation

## 2017-06-21 DIAGNOSIS — Y92009 Unspecified place in unspecified non-institutional (private) residence as the place of occurrence of the external cause: Secondary | ICD-10-CM | POA: Diagnosis not present

## 2017-06-21 DIAGNOSIS — I252 Old myocardial infarction: Secondary | ICD-10-CM | POA: Insufficient documentation

## 2017-06-21 DIAGNOSIS — Z801 Family history of malignant neoplasm of trachea, bronchus and lung: Secondary | ICD-10-CM | POA: Insufficient documentation

## 2017-06-21 DIAGNOSIS — R5383 Other fatigue: Secondary | ICD-10-CM | POA: Insufficient documentation

## 2017-06-21 DIAGNOSIS — R634 Abnormal weight loss: Secondary | ICD-10-CM | POA: Diagnosis not present

## 2017-06-21 DIAGNOSIS — Z9181 History of falling: Secondary | ICD-10-CM

## 2017-06-21 LAB — COMPREHENSIVE METABOLIC PANEL WITH GFR
ALT: 18 U/L (ref 17–63)
AST: 33 U/L (ref 15–41)
Albumin: 2.6 g/dL — ABNORMAL LOW (ref 3.5–5.0)
Alkaline Phosphatase: 100 U/L (ref 38–126)
Anion gap: 8 (ref 5–15)
BUN: 7 mg/dL (ref 6–20)
CO2: 25 mmol/L (ref 22–32)
Calcium: 8.4 mg/dL — ABNORMAL LOW (ref 8.9–10.3)
Chloride: 98 mmol/L — ABNORMAL LOW (ref 101–111)
Creatinine, Ser: 0.85 mg/dL (ref 0.61–1.24)
GFR calc Af Amer: >60 mL/min
GFR calc non Af Amer: >60 mL/min
Glucose, Bld: 114 mg/dL — ABNORMAL HIGH (ref 65–99)
Potassium: 3.5 mmol/L (ref 3.5–5.1)
Sodium: 131 mmol/L — ABNORMAL LOW (ref 135–145)
Total Bilirubin: 0.7 mg/dL (ref 0.3–1.2)
Total Protein: 5.6 g/dL — ABNORMAL LOW (ref 6.5–8.1)

## 2017-06-21 LAB — CBC WITH DIFFERENTIAL/PLATELET
Basophils Absolute: 0 10*3/uL (ref 0–0.1)
Basophils Relative: 1 %
Eosinophils Absolute: 0.1 10*3/uL (ref 0–0.7)
Eosinophils Relative: 2 %
HEMATOCRIT: 33.5 % — AB (ref 40.0–52.0)
HEMOGLOBIN: 11.8 g/dL — AB (ref 13.0–18.0)
LYMPHS PCT: 21 %
Lymphs Abs: 0.7 10*3/uL — ABNORMAL LOW (ref 1.0–3.6)
MCH: 34.2 pg — AB (ref 26.0–34.0)
MCHC: 35.2 g/dL (ref 32.0–36.0)
MCV: 97.1 fL (ref 80.0–100.0)
MONO ABS: 0.3 10*3/uL (ref 0.2–1.0)
MONOS PCT: 11 %
NEUTROS ABS: 2.2 10*3/uL (ref 1.4–6.5)
NEUTROS PCT: 65 %
Platelets: 153 10*3/uL (ref 150–440)
RBC: 3.45 MIL/uL — ABNORMAL LOW (ref 4.40–5.90)
RDW: 14.5 % (ref 11.5–14.5)
WBC: 3.3 10*3/uL — ABNORMAL LOW (ref 3.8–10.6)

## 2017-06-21 LAB — MAGNESIUM: Magnesium: 1.4 mg/dL — ABNORMAL LOW (ref 1.7–2.4)

## 2017-06-21 LAB — CBC
HCT: 37.2 % — ABNORMAL LOW (ref 40.0–52.0)
Hemoglobin: 12.8 g/dL — ABNORMAL LOW (ref 13.0–18.0)
MCH: 33.2 pg (ref 26.0–34.0)
MCHC: 34.5 g/dL (ref 32.0–36.0)
MCV: 96.2 fL (ref 80.0–100.0)
PLATELETS: 182 10*3/uL (ref 150–440)
RBC: 3.87 MIL/uL — ABNORMAL LOW (ref 4.40–5.90)
RDW: 14.5 % (ref 11.5–14.5)
WBC: 4.1 10*3/uL (ref 3.8–10.6)

## 2017-06-21 LAB — BASIC METABOLIC PANEL
Anion gap: 5 (ref 5–15)
BUN: 7 mg/dL (ref 6–20)
CO2: 28 mmol/L (ref 22–32)
Calcium: 8.4 mg/dL — ABNORMAL LOW (ref 8.9–10.3)
Chloride: 98 mmol/L — ABNORMAL LOW (ref 101–111)
Creatinine, Ser: 0.81 mg/dL (ref 0.61–1.24)
GFR calc Af Amer: >60 mL/min (ref >60)
GFR calc non Af Amer: >60 mL/min (ref >60)
Glucose, Bld: 110 mg/dL — ABNORMAL HIGH (ref 65–99)
Potassium: 3.4 mmol/L — ABNORMAL LOW (ref 3.5–5.1)
Sodium: 131 mmol/L — ABNORMAL LOW (ref 135–145)

## 2017-06-21 LAB — DIFFERENTIAL
Basophils Absolute: 0 K/uL (ref 0–0.1)
Basophils Relative: 1 %
Eosinophils Absolute: 0 K/uL (ref 0–0.7)
Eosinophils Relative: 1 %
Lymphocytes Relative: 21 %
Lymphs Abs: 0.9 K/uL — ABNORMAL LOW (ref 1.0–3.6)
Monocytes Absolute: 0.4 K/uL (ref 0.2–1.0)
Monocytes Relative: 10 %
Neutro Abs: 2.7 K/uL (ref 1.4–6.5)
Neutrophils Relative %: 67 %

## 2017-06-21 LAB — TROPONIN I

## 2017-06-21 MED ORDER — HEPARIN SOD (PORK) LOCK FLUSH 100 UNIT/ML IV SOLN
500.0000 [IU] | Freq: Once | INTRAVENOUS | Status: AC
  Administered 2017-06-21: 500 [IU] via INTRAVENOUS
  Filled 2017-06-21: qty 5

## 2017-06-21 MED ORDER — OXYCODONE HCL 5 MG PO TABS: 5.0000 mg | ORAL_TABLET | Freq: Three times a day (TID) | ORAL | 0 refills | Status: DC | PRN

## 2017-06-21 NOTE — ED Notes (Signed)
ED Provider at bedside. 

## 2017-06-21 NOTE — ED Notes (Signed)
Pt ambulatory upon discharge; verbalized understanding of discharge instructions, follow-up care and prescription. VSS. Skin warm and dry. A&O x4.

## 2017-06-21 NOTE — ED Provider Notes (Signed)
[facility] Emergency Department Provider Note  Time seen: 6:25 PM  I have reviewed the triage vital signs and the nursing notes.   HISTORY  Chief Complaint Fall    HPI Philip [name] is a 57 y.o. male with a past medical history of CAD, status post MI, hypertension, hyperlipidemia, lung cancer status post chemotherapy and radiation, no longer receiving treatment at this time, presents to the emergency department after a fall.  According to the patient 2 days ago he states he got up to go to the kitchen and believes he passed out falling and hitting the left side of his head on the ground.  Patient states he takes Plavix and since the fall has felt somewhat unsteady and dizzy at times.  Denies any focal weakness or numbness.  No confusion.  Patient does state a past history of seizures approximately 20 years ago but none recently and is no longer taking medication for this.  Denies any chest pain at any point.  Denies shortness of breath.  Denies any fever.  Largely negative review of systems.   Past Medical History:  Diagnosis Date  . Anemia   . CAD (coronary artery disease)    a. 08/2014 Inf STEMI/CABG x 3 (LIMA->LAD, VG->Diag, RIMA->RCA).  . DVT, recurrent, lower extremity, acute, left ([place]) 2016  . GIB (gastrointestinal bleeding)    a. In the setting of DAPT-->tolerating plavix only.  . Hyperlipidemia   . Hypertensive heart disease   . Lung cancer ([place])    a. s/p chemo/radiation.  Marland Kitchen PAD (peripheral artery disease) ([place])    a. 03/2015 Periph Angio: short occlusion of L pop w/ evidence of embolization into the DP->5.0x50 mm Inova self-expanding stent;  b. 04/08/2015 ABI: R 1.12, L 0.99; c. 01/2017 LE Duplex: bilat SFA obstructive dzs, patent L pop.  . Tobacco abuse   . Toe amputation status, left [facility]) 07/09/2016   2017    Patient Active Problem List   Diagnosis Date Noted  . Lung cancer metastatic to bone ([place]) 04/24/2017  . 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 Marijuana user 04/24/2017  . Abnormal drug screen 04/24/2017  . Skin abscess 04/17/2017  . Non-intractable vomiting with nausea 01/20/2017  . Hypokalemia 01/20/2017  . Dehydration 01/20/2017  . Abscess of left hand 11/13/2016  . Chronic thoracic spine pain 10/28/2016  . Antineoplastic chemotherapy induced anemia 10/28/2016  . Encounter for antineoplastic chemotherapy 09/10/2016  . Charcot's joint of foot (Right) 08/21/2016  . GERD (gastroesophageal reflux disease) 08/21/2016  . Myocardial infarction ([place]) 08/21/2016  . Hyponatremia 08/19/2016  . Hypocalcemia 08/19/2016  . Hypomagnesemia 08/19/2016  . Routine history and physical examination of adult 08/09/2016  . Cancer of upper lobe of left lung ([place]) 07/31/2016  . Cancer related pain 07/31/2016  . Cough 07/31/2016  . Goals of care, counseling/discussion 07/26/2016  . Acute CVA (cerebrovascular accident) ([place]) 07/26/2016  . Small cell lung cancer ([place]) 07/19/2016  . Postobstructive pneumonia 07/17/2016  . Bone metastasis ([place]) 07/12/2016  . Lymphadenopathy, mediastinal 07/02/2016  . Weight loss 07/02/2016  . Night sweats 07/02/2016  . Chronic foot pain (Left) 03/21/2016  . Chronic pain syndrome 03/21/2016  . Neuropathic pain 03/21/2016  . Iron deficiency anemia 01/08/2016  . Dry gangrene ([place]) 01/02/2016  . Osteopenia of both feet 12/10/2015  . Peripheral neuropathy 06/12/2015  . Hyperlipidemia, unspecified   . PAD (peripheral artery disease) ([place]) 03/29/2015  . Hypertension 10/05/2014  . Pure hypercholesterolemia 10/05/2014  . Acute sinusitis, unspecified  09/14/2014  . Rash and other nonspecific skin eruption 09/14/2014  . S/P CABG x 3 08/25/2014  . Status post aorto-coronary artery bypass graft 08/25/2014  . History of MI (myocardial infarction) 08/24/2014  . CAD (coronary artery disease), native coronary artery 08/24/2014  . Acute myocardial infarction of inferior wall ([place]) 08/24/2014  . Right  ankle pain 07/20/2014  . Pain in joint involving ankle and foot 07/20/2014  . Bilateral foot pain 07/06/2014  . Tobacco use disorder 07/06/2014  . Convulsions ([place]) 07/06/2014  . Preventative health care 07/06/2014  . Pain in soft tissues of limb 07/06/2014    Past Surgical History:  Procedure Laterality Date  . CARDIAC CATHETERIZATION N/A 08/24/2014   Procedure: Left Heart Cath and Coronary Angiography;  Surgeon: Isaias Cowman, MD;  Location: [place] [facility];  Service: Cardiovascular;  Laterality: N/A;  . CARDIAC CATHETERIZATION N/A 08/24/2014   Procedure: Coronary Stent Intervention;  Surgeon: Isaias Cowman, MD;  Location: [place] [facility];  Service: Cardiovascular;  Laterality: N/A;  . CORONARY ARTERY BYPASS GRAFT N/A 08/25/2014   Procedure: CORONARY ARTERY BYPASS GRAFTING (CABG), ON PUMP, TIMES THREE, USING BILATERAL MAMMARY ARTERIES, RIGHT GREATER SAPHENOUS VEIN HARVESTED ENDOSCOPICALLY;  Surgeon: Melrose Nakayama, MD;  Location: [place];  Service: Open Heart Surgery;  Laterality: N/A;  . INCISION AND DRAINAGE ABSCESS Left 11/15/2016   Procedure: INCISION AND DRAINAGE ABSCESS OF LEFT WRIST;  Surgeon: Roseanne Kaufman, MD;  Location: [place];  Service: Orthopedics;  Laterality: Left;  . PERIPHERAL VASCULAR CATHETERIZATION N/A 03/30/2015   Procedure: Abdominal Aortogram w/Lower Extremity;  Surgeon: Wellington Hampshire, MD;  Location: [place] [facility];  Service: Cardiovascular;  Laterality: N/A;  . PORTA CATH INSERTION N/A 07/27/2016   Procedure: Glori Luis Cath Insertion;  Surgeon: Algernon Huxley, MD;  Location: [place] [facility];  Service: Cardiovascular;  Laterality: N/A;  . TEE WITHOUT CARDIOVERSION N/A 08/25/2014   Procedure: TRANSESOPHAGEAL ECHOCARDIOGRAM (TEE);  Surgeon: Melrose Nakayama, MD;  Location: [place];  Service: Open Heart Surgery;  Laterality: N/A;    Prior to Admission medications   Medication Sig Start Date End Date Taking? Authorizing Provider   atorvastatin (LIPITOR) 40 MG tablet TAKE 1 TABLET BY MOUTH DAILY 08/02/16   Wellington Hampshire, MD  benzonatate (TESSALON) 100 MG capsule TK 1 C PO TID PRN 03/16/17   [provider]  buPROPion (WELLBUTRIN XL) 150 MG 24 hr tablet TK 1 T PO QD 01/18/17   [provider]  Calcium Carb-Cholecalciferol (CALCIUM + D3 PO) Take 1 tablet by mouth 3 (three) times daily.    [provider]  clopidogrel (PLAVIX) 75 MG tablet Take 1 tablet (75 mg total) by mouth daily. 11/14/16   Wellington Hampshire, MD  diphenhydrAMINE (BENADRYL) 25 mg capsule Take 25 mg by mouth 2 (two) times daily as needed for allergies.     [provider]  FLOVENT HFA 220 MCG/ACT inhaler INL 2 PFS PO BID 02/01/17   [provider]  fluconazole (DIFLUCAN) 100 MG tablet Take 1 tablet (100 mg total) daily by mouth. Patient not taking: Reported on 06/13/2017 02/27/17   Noreene Filbert, MD  furosemide (LASIX) 20 MG tablet Take 20 mg daily by mouth. 02/21/17   [provider]  lidocaine-prilocaine (EMLA) cream Apply to affected area once Patient taking differently: Apply 1 application topically See admin instructions. TO AFFECTED AREA AS DIRECTED 07/31/16   Lequita Asal, MD  lisinopril (PRINIVIL,ZESTRIL) 5 MG tablet TAKE 1 TABLET(5 MG) BY MOUTH  DAILY 06/19/17   Theora Gianotti, NP  magnesium oxide (MAG-OX) 400 (241.3 Mg) MG tablet Take 1 tablet (400 mg total) by mouth 3 (three) times daily. 05/16/17   Karen Kitchens, NP  metoprolol tartrate (LOPRESSOR) 25 MG tablet TAKE 1 TABLET BY MOUTH TWICE DAILY 12/05/16   Wellington Hampshire, MD  ondansetron (ZOFRAN) 8 MG tablet TAKE 1 TABLET BY MOUTH EVERY 8 HOURS AS NEEDED FOR NAUSEA OR VOMITING 05/17/17   Karen Kitchens, NP  pantoprazole (PROTONIX) 40 MG tablet TK 1 T PO QD 01/18/17   [provider]  potassium chloride (K-DUR) 10 MEQ tablet Take 1 tablet (10 mEq total) by mouth daily. 05/16/17   Karen Kitchens, NP  PROAIR HFA [phone] Base)  MCG/ACT inhaler INL 2 PFS PO Q 4 TO 6 H PRN 02/01/17   [provider]  promethazine (PHENERGAN) 25 MG suppository Place 1 suppository (25 mg total) rectally every 6 (six) hours as needed for nausea or vomiting. 04/11/17   Lequita Asal, MD  STIOLTO RESPIMAT 2.5-2.5 MCG/ACT AERS INL 2 PFS PO QD 02/04/17   [provider]  Vitamin D, Ergocalciferol, (DRISDOL) 50000 units CAPS capsule TK ONE C PO Q WEEK 03/11/17   [provider]    Allergies  Allergen Reactions  . Tylenol [Acetaminophen] Palpitations  . Amitriptyline Other (See Comments)    Burning sensation all over  . Aspirin   . Contrast Media [Iodinated Diagnostic Agents] Other (See Comments)    Pt states that he got "knots behind his ears"  . Nsaids Other (See Comments)    Blood in stools  . Ibuprofen Other (See Comments) and Nausea Only    Blood in stools  . Tape Rash and Other (See Comments)    PLEASE USE COBAN WRAP; THE PATIENT'S SKIN TEARS WHEN BANDAGES ARE REMOVED!!    Family History  Problem Relation Age of Onset  . Hypertension Father   . Lung cancer Father   . Heart attack Mother 84       STENT  . Hyperlipidemia Mother   . Heart attack Brother   . Heart attack Brother   . Breast cancer Sister   . Heart attack Sister   . Colon cancer Paternal Grandfather     Social History Social History   Tobacco Use  . Smoking status: Current Every Day Smoker    Packs/day: 0.50    Types: Cigarettes  . Smokeless tobacco: Never Used  . Tobacco comment: 4cigs day down from 2ppd  Substance Use Topics  . Alcohol use: Yes    Alcohol/week: 0.0 oz    Comment: Sometimes on w/e.  . Drug use: No    Review of Systems Constitutional: Negative for fever. Eyes: Negative for visual complaints ENT: Negative for recent illness/congestion Cardiovascular: Negative for chest pain. Respiratory: Negative for shortness of breath. Gastrointestinal: Negative for abdominal pain, vomiting and  diarrhea. Genitourinary: Negative for urinary compaints Musculoskeletal: Moderate neck pain Skin: Negative for skin complaints  Neurological: Negative for headache All other ROS negative  ____________________________________________   PHYSICAL EXAM:  VITAL SIGNS: ED Triage Vitals [06/21/17 1201]  Enc Vitals Group     BP (!) 126/93     Pulse Rate 82     Resp 18     Temp 98 F (36.7 C)     Temp Source Oral     SpO2 99 %     Weight 126 lb (57.2 kg)     Height 5\' 11"  (  1.803 m)     Head Circumference      Peak Flow      Pain Score 7     Pain Loc      Pain Edu?      Excl. in [place]?    Constitutional: Alert and oriented. Well appearing and in no distress. Eyes: Normal exam ENT   Head: Normocephalic and atraumatic.   Mouth/Throat: Mucous membranes are moist. Cardiovascular: Normal rate, regular rhythm. No murmur Respiratory: Normal respiratory effort without tachypnea nor retractions. Breath sounds are clear  Gastrointestinal: Soft and nontender. No distention.   Musculoskeletal: Mild mid C-spine tenderness, moderate left paraspinal C-spine tenderness to palpation. Neurologic:  Normal speech and language. No gross focal neurologic deficits  Skin:  Skin is warm, dry and intact.  Psychiatric: Mood and affect are normal.   ____________________________________________    EKG  EKG reviewed and interpreted by myself shows normal sinus rhythm at 79 bpm with a narrow QRS, normal axis, normal intervals, no concerning ST changes.  ____________________________________________    RADIOLOGY  CT scans of the head showed no acute abnormality.  CT scan cervical spine shows no acute abnormality.  ____________________________________________   INITIAL IMPRESSION / ASSESSMENT AND PLAN / ED COURSE  Pertinent labs & imaging results that were available during my care of the patient were reviewed by me and considered in my medical decision making (see chart for  details).  Patient presents to the emergency department after a fall 2 days ago suffering a head injury.  Differential would include ICH, mass, seizure, syncope.  Overall the patient appears very well, no distress, very normal-appearing physical examination.  Reassuring EKG with normal lab work/baseline lab work.  CT scans are negative for acute abnormality.  Given the patient's neck pain we will discharge with a short course of pain medication, patient agreeable to this plan.  I discussed with the patient the need to orally hydrate with Gatorade/Pedialyte and follow-up with his doctor this week.  I discussed return precautions for any further syncopal episodes or falls or any chest pain or shortness of breath.  Patient and wife are agreeable to this plan of care.  Patient's labs do show mild hyponatremia which is chronic for the patient he takes salt tablets at home for this.  ____________________________________________   FINAL CLINICAL IMPRESSION(S) / ED DIAGNOSES  Cheral Marker, MD 06/21/17 [phone]

## 2017-06-21 NOTE — Discharge Instructions (Signed)
As we discussed please drink plenty of fluids.  Please follow-up with your doctor in the next 2 days for recheck/reevaluation.  Return to the emergency department for any further falls, if you pass out, feel lightheaded or develop any chest pain or trouble breathing.

## 2017-06-21 NOTE — ED Triage Notes (Signed)
Pt states Wednesday night he blacked out and fell. Hitting L side of head (abraison noted), L shoulder. C/o posterior neck pain. States blurred vision yesterday but denies today. Takes plavix. Alert, oriented, speaking in complete sentences.

## 2017-06-21 NOTE — ED Notes (Signed)
FN: pt brought over from cancer center with reports of having a fall on Wednesday, hit his head, now having pain in neck, laceration to face. Pt is not recving chemo at this time. NAD.

## 2017-06-21 NOTE — Progress Notes (Signed)
[facility] day:  06/21/2017   Chief Complaint: Philip [name] is a 57 y.o. male with extensive stage small cell lung cancer who is seen for 1 week assessment.  HPI:  The patient was last seen in the medical oncology clinic on 06/13/2017.  At that time, he had nausea and vomiting. He was taking his prescribed antiemetics, however he continued to have issues. Patient had lost 5 pounds since 04/2017. He had significant chronic back pain (present 3 years ago before his diagnosis of cancer). WBC was 4700 with an Goodwin of 3200.  Potassium was 2.8 and magnesium was 1.4.  He received IV potassium and magnesium.  UGI on 06/14/2017 was normal.  There was no evidence of hiatal hernia or reflux. There was no focal gastric abnormality identified. There was no evidence of gastric distention. Duodenum bulb and C-loop were normal. Proximal small bowel loops were normal.  He was referred to GI and the pain clinic.  He was seen by nutrition, Jennet Maduro, on 06/13/2017.  During the interim, he states that he blacked out in the evening on 06/19/2017.  He denied any warning symptoms or seizure activity.  His wife notes that he was out for 30 seconds.  He hit his head, neck, and left arm.  He has bruising on his left ear and left upper arm.  He cut his left temple and has used ointment on the area since.  He describes being light headed after the event and on 06/20/2017.  He has had neck and left sided face pain since that time.  He denies fever.  He did not see GI.  He has a follow-up appointment in 1 week.   Past Medical History:  Diagnosis Date  . Anemia   . CAD (coronary artery disease)    a. 08/2014 Inf STEMI/CABG x 3 (LIMA->LAD, VG->Diag, RIMA->RCA).  . DVT, recurrent, lower extremity, acute, left ([place]) 2016  . GIB (gastrointestinal bleeding)    a. In the setting of DAPT-->tolerating plavix only.  . Hyperlipidemia   . Hypertensive heart disease   . Lung cancer  ([place])    a. s/p chemo/radiation.  Marland Kitchen PAD (peripheral artery disease) ([place])    a. 03/2015 Periph Angio: short occlusion of L pop w/ evidence of embolization into the DP->5.0x50 mm Inova self-expanding stent;  b. 04/08/2015 ABI: R 1.12, L 0.99; c. 01/2017 LE Duplex: bilat SFA obstructive dzs, patent L pop.  . Tobacco abuse   . Toe amputation status, left [facility]) 07/09/2016   2017    Past Surgical History:  Procedure Laterality Date  . CARDIAC CATHETERIZATION N/A 08/24/2014   Procedure: Left Heart Cath and Coronary Angiography;  Surgeon: Isaias Cowman, MD;  Location: [place] [facility];  Service: Cardiovascular;  Laterality: N/A;  . CARDIAC CATHETERIZATION N/A 08/24/2014   Procedure: Coronary Stent Intervention;  Surgeon: Isaias Cowman, MD;  Location: [place] [facility];  Service: Cardiovascular;  Laterality: N/A;  . CORONARY ARTERY BYPASS GRAFT N/A 08/25/2014   Procedure: CORONARY ARTERY BYPASS GRAFTING (CABG), ON PUMP, TIMES THREE, USING BILATERAL MAMMARY ARTERIES, RIGHT GREATER SAPHENOUS VEIN HARVESTED ENDOSCOPICALLY;  Surgeon: Melrose Nakayama, MD;  Location: [place];  Service: Open Heart Surgery;  Laterality: N/A;  . INCISION AND DRAINAGE ABSCESS Left 11/15/2016   Procedure: INCISION AND DRAINAGE ABSCESS OF LEFT WRIST;  Surgeon: Roseanne Kaufman, MD;  Location: [place];  Service: Orthopedics;  Laterality: Left;  . PERIPHERAL VASCULAR CATHETERIZATION N/A 03/30/2015   Procedure:  Abdominal Aortogram w/Lower Extremity;  Surgeon: Wellington Hampshire, MD;  Location: [place] [facility];  Service: Cardiovascular;  Laterality: N/A;  . PORTA CATH INSERTION N/A 07/27/2016   Procedure: Glori Luis Cath Insertion;  Surgeon: Algernon Huxley, MD;  Location: [place] [facility];  Service: Cardiovascular;  Laterality: N/A;  . TEE WITHOUT CARDIOVERSION N/A 08/25/2014   Procedure: TRANSESOPHAGEAL ECHOCARDIOGRAM (TEE);  Surgeon: Melrose Nakayama, MD;  Location: [place];  Service: Open Heart Surgery;  Laterality:  N/A;    Family History  Problem Relation Age of Onset  . Hypertension Father   . Lung cancer Father   . Heart attack Mother 63       STENT  . Hyperlipidemia Mother   . Heart attack Brother   . Heart attack Brother   . Breast cancer Sister   . Heart attack Sister   . Colon cancer Paternal Grandfather     Social History:  reports that he has been smoking cigarettes.  He has been smoking about 0.50 packs per day. he has never used smokeless tobacco. He reports that he drinks alcohol. He reports that he does not use drugs.  The patient smokes 1/2 pack of cigarettes/day (previously 2 1/2 ppd).  He started smoking in his teens.  He denies any exposure to radiation or toxins.  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  He lives in [place].  His wife is named Publishing copy.  He is accompanied by his wife today.  Allergies:  Allergies  Allergen Reactions  . Tylenol [Acetaminophen] Palpitations  . Amitriptyline Other (See Comments)    Burning sensation all over  . Aspirin   . Contrast Media [Iodinated Diagnostic Agents] Other (See Comments)    Pt states that he got "knots behind his ears"  . Nsaids Other (See Comments)    Blood in stools  . Ibuprofen Other (See Comments) and Nausea Only    Blood in stools  . Tape Rash and Other (See Comments)    PLEASE USE COBAN WRAP; THE PATIENT'S SKIN TEARS WHEN BANDAGES ARE REMOVED!!    Current Medications: Current Outpatient Medications  Medication Sig Dispense Refill  . atorvastatin (LIPITOR) 40 MG tablet TAKE 1 TABLET BY MOUTH DAILY 90 tablet 3  . benzonatate (TESSALON) 100 MG capsule TK 1 C PO TID PRN  2  . buPROPion (WELLBUTRIN XL) 150 MG 24 hr tablet TK 1 T PO QD  5  . Calcium Carb-Cholecalciferol (CALCIUM + D3 PO) Take 1 tablet by mouth 3 (three) times daily.    . clopidogrel (PLAVIX) 75 MG tablet Take 1 tablet (75 mg total) by mouth daily. 90 tablet 3  . diphenhydrAMINE (BENADRYL) 25 mg capsule Take 25 mg by mouth 2 (two) times  daily as needed for allergies.     Marland Kitchen FLOVENT HFA 220 MCG/ACT inhaler INL 2 PFS PO BID  5  . furosemide (LASIX) 20 MG tablet Take 20 mg daily by mouth.  1  . lidocaine-prilocaine (EMLA) cream Apply to affected area once (Patient taking differently: Apply 1 application topically See admin instructions. TO AFFECTED AREA AS DIRECTED) 30 g 3  . lisinopril (PRINIVIL,ZESTRIL) 5 MG tablet TAKE 1 TABLET(5 MG) BY MOUTH DAILY 90 tablet 3  . magnesium oxide (MAG-OX) 400 (241.3 Mg) MG tablet Take 1 tablet (400 mg total) by mouth 3 (three) times daily. 90 tablet 3  . metoprolol tartrate (LOPRESSOR) 25 MG tablet TAKE 1 TABLET BY MOUTH TWICE DAILY 180 tablet 3  .  ondansetron (ZOFRAN) 8 MG tablet TAKE 1 TABLET BY MOUTH EVERY 8 HOURS AS NEEDED FOR NAUSEA OR VOMITING 30 tablet 0  . pantoprazole (PROTONIX) 40 MG tablet TK 1 T PO QD  5  . potassium chloride (K-DUR) 10 MEQ tablet Take 1 tablet (10 mEq total) by mouth daily. 90 tablet 1  . PROAIR HFA 108 (90 Base) MCG/ACT inhaler INL 2 PFS PO Q 4 TO 6 H PRN  5  . promethazine (PHENERGAN) 25 MG suppository Place 1 suppository (25 mg total) rectally every 6 (six) hours as needed for nausea or vomiting. 12 each 5  . STIOLTO RESPIMAT 2.5-2.5 MCG/ACT AERS INL 2 PFS PO QD  5  . Vitamin D, Ergocalciferol, (DRISDOL) 50000 units CAPS capsule TK ONE C PO Q WEEK  5  . fluconazole (DIFLUCAN) 100 MG tablet Take 1 tablet (100 mg total) daily by mouth. (Patient not taking: Reported on 06/13/2017) 7 tablet 3  . oxyCODONE (ROXICODONE) 5 MG immediate release tablet Take 1 tablet (5 mg total) by mouth every 8 (eight) hours as needed. 12 tablet 0   No current facility-administered medications for this visit.    Facility-Administered Medications Ordered in Other Visits  Medication Dose Route Frequency Provider Last Rate Last Dose  . heparin lock flush 100 unit/mL  500 Units Intravenous Once Lequita Asal, MD        Review of Systems:  GENERAL:  Feels "weak". No fevers or sweats.   Weight up 1 pound.  PERFORMANCE STATUS (ECOG):  2 HEENT:  No visual changes, runny nose, sore throat, mouth sores or tenderness. Lungs: No shortness of breath or cough.  No hemoptysis. Cardiac:  No chest pain, palpitations, orthopnea, or PND. GI:  Nausea and vomiting continue.  No diarrhea, constipation, melena or hematochezia.   GU:  No urgency, frequency, dysuria, or hematuria. Musculoskeletal:  Low back pain (chronic). Neck and face pain s/p fall.  No joint pain.  No muscle tenderness. Extremities:  Lower extremity pain secondary to peripheral vascular disease.  No swelling. Skin:  Left face laceration.  No rashes or skin changes. Neuro:  No headache, numbness or weakness, balance or coordination issues. Endocrine:  No diabetes, thyroid issues, hot flashes or night sweats. Psych:  No mood changes, depression or anxiety. Pain: Neck pain 7 out of 10. Review of systems:  All other systems reviewed and found to be negative.  Physical Exam: Blood pressure 124/86, pulse 84, temperature 97.7 F (36.5 C), temperature source Tympanic, resp. rate 18, weight 127 lb 8 oz (57.8 kg). GENERAL:  Thin gentleman sitting comfortably in the exam room in no acute distress.  He has a cane at his side. MENTAL STATUS: Alert and oriented to person, place and time. HEAD:Wearing a cap.  Normocephalic, atraumatic, face symmetric.  Cushingoid features. EYES:[place]. Pupils equal round and reactive to light and accomodation. No conjunctivitis or scleral icterus. NKN:LZJQBHALPF clear without lesion. Tonguenormal. Poor dentition. Mucous membranes moist. NECK:  Tender to palpation. RESPIRATORY:Clear to auscultationwithout rales, wheezes or rhonchi. CARDIOVASCULAR:Regular rate andrhythmwithout murmur, rub or gallop. ABDOMEN:Soft, non-tender, with active bowel sounds, and no hepatosplenomegaly. No masses. SKIN: Left maxillary area slightly erythematous/edematous with overlying yellow  material/purulence at site of abrasion/laceration.  Left upper extremity ecchymosis. EXTREMITIES: Left upper extremity tender.  No lower extremity edema, no skin discoloration or tenderness. No palpable cords.  LYMPHNODES: No palpable cervical, supraclavicular, axillary or inguinal adenopathy  NEUROLOGICAL: Alert & oriented, cranial nerves II-XII intact; motor strength 5/5 throughout; sensation intact; finger to  nose and RAM normal. PSYCH: Appropriate.   Admission on 06/21/2017, Discharged on 06/21/2017  Component Date Value Ref Range Status  . WBC 06/21/2017 4.1  3.8 - 10.6 K/uL Final  . RBC 06/21/2017 3.87* 4.40 - 5.90 MIL/uL Final  . Hemoglobin 06/21/2017 12.8* 13.0 - 18.0 g/dL Final  . HCT 06/21/2017 37.2* 40.0 - 52.0 % Final  . MCV 06/21/2017 96.2  80.0 - 100.0 fL Final  . MCH 06/21/2017 33.2  26.0 - 34.0 pg Final  . MCHC 06/21/2017 34.5  32.0 - 36.0 g/dL Final  . RDW 06/21/2017 14.5  11.5 - 14.5 % Final  . Platelets 06/21/2017 182  150 - 440 K/uL Final   Performed at [facility], [address], [place], [place] [address]  . Neutrophils Relative % 06/21/2017 67  % Final  . Neutro Abs 06/21/2017 2.7  1.4 - 6.5 K/uL Final  . Lymphocytes Relative 06/21/2017 21  % Final  . Lymphs Abs 06/21/2017 0.9* 1.0 - 3.6 K/uL Final  . Monocytes Relative 06/21/2017 10  % Final  . Monocytes Absolute 06/21/2017 0.4  0.2 - 1.0 K/uL Final  . Eosinophils Relative 06/21/2017 1  % Final  . Eosinophils Absolute 06/21/2017 0.0  0 - 0.7 K/uL Final  . Basophils Relative 06/21/2017 1  % Final  . Basophils Absolute 06/21/2017 0.0  0 - 0.1 K/uL Final   Performed at [facility], [address], [place], [place] [address]  . Sodium 06/21/2017 131* 135 - 145 mmol/L Final  . Potassium 06/21/2017 3.5  3.5 - 5.1 mmol/L Final  . Chloride 06/21/2017 98* 101 - 111 mmol/L Final  . CO2 06/21/2017 25  22 - 32 mmol/L Final  . Glucose, Bld 06/21/2017 114* 65 - 99 mg/dL Final  . BUN 06/21/2017 7   6 - 20 mg/dL Final  . Creatinine, Ser 06/21/2017 0.85  0.61 - 1.24 mg/dL Final  . Calcium 06/21/2017 8.4* 8.9 - 10.3 mg/dL Final  . Total Protein 06/21/2017 5.6* 6.5 - 8.1 g/dL Final  . Albumin 06/21/2017 2.6* 3.5 - 5.0 g/dL Final  . AST 06/21/2017 33  15 - 41 U/L Final  . ALT 06/21/2017 18  17 - 63 U/L Final  . Alkaline Phosphatase 06/21/2017 100  38 - 126 U/L Final  . Total Bilirubin 06/21/2017 0.7  0.3 - 1.2 mg/dL Final  . GFR calc non Af Amer 06/21/2017 >60  >60 mL/min Final  . GFR calc Af Amer 06/21/2017 >60  >60 mL/min Final   Comment: (NOTE) The eGFR has been calculated using the CKD EPI equation. This calculation has not been validated in all clinical situations. eGFR's persistently <60 mL/min signify possible Chronic Kidney Disease.   Georgiann Hahn gap 06/21/2017 8  5 - 15 Final   Performed at [facility], [address] [place], [place] [address]  . Troponin I 06/21/2017 <0.03  <0.03 ng/mL Final   Performed at [facility], [address] [place], [place] [address]  Infusion on 06/21/2017  Component Date Value Ref Range Status  . Magnesium 06/21/2017 1.4* 1.7 - 2.4 mg/dL Final   Performed at [facility], [address] [place], [place] [address]  . Sodium 06/21/2017 131* 135 - 145 mmol/L Final  . Potassium 06/21/2017 3.4* 3.5 - 5.1 mmol/L Final  . Chloride 06/21/2017 98* 101 - 111 mmol/L Final  . CO2 06/21/2017 28  22 - 32 mmol/L Final  . Glucose, Bld 06/21/2017 110* 65 - 99 mg/dL Final  .  BUN 06/21/2017 7  6 - 20 mg/dL Final  . Creatinine, Ser 06/21/2017 0.81  0.61 - 1.24 mg/dL Final  . Calcium 06/21/2017 8.4* 8.9 - 10.3 mg/dL Final  . GFR calc non Af Amer 06/21/2017 >60  >60 mL/min Final  . GFR calc Af Amer 06/21/2017 >60  >60 mL/min Final   Comment: (NOTE) The eGFR has been calculated using the CKD EPI equation. This calculation has not been validated in all clinical situations. eGFR's persistently <60 mL/min signify possible Chronic  Kidney Disease.   Georgiann Hahn gap 06/21/2017 5  5 - 15 Final   Performed at [facility], [address] [place], [place] [address]  . WBC 06/21/2017 3.3* 3.8 - 10.6 K/uL Final  . RBC 06/21/2017 3.45* 4.40 - 5.90 MIL/uL Final  . Hemoglobin 06/21/2017 11.8* 13.0 - 18.0 g/dL Final  . HCT 06/21/2017 33.5* 40.0 - 52.0 % Final  . MCV 06/21/2017 97.1  80.0 - 100.0 fL Final  . MCH 06/21/2017 34.2* 26.0 - 34.0 pg Final  . MCHC 06/21/2017 35.2  32.0 - 36.0 g/dL Final  . RDW 06/21/2017 14.5  11.5 - 14.5 % Final  . Platelets 06/21/2017 153  150 - 440 K/uL Final  . Neutrophils Relative % 06/21/2017 65  % Final  . Neutro Abs 06/21/2017 2.2  1.4 - 6.5 K/uL Final  . Lymphocytes Relative 06/21/2017 21  % Final  . Lymphs Abs 06/21/2017 0.7* 1.0 - 3.6 K/uL Final  . Monocytes Relative 06/21/2017 11  % Final  . Monocytes Absolute 06/21/2017 0.3  0.2 - 1.0 K/uL Final  . Eosinophils Relative 06/21/2017 2  % Final  . Eosinophils Absolute 06/21/2017 0.1  0 - 0.7 K/uL Final  . Basophils Relative 06/21/2017 1  % Final  . Basophils Absolute 06/21/2017 0.0  0 - 0.1 K/uL Final   Performed at [facility], [address], [place],  [address]    Assessment:  Philip [name] is a 57 y.o. male with extensive stage small cell lung cancer.  He presented with a 2 year history of night sweats and a 36 pound weight loss in 6 months.  Right supraclavicular biopsy on 07/19/2016 revealed small cell carcinoma of the lung.  CEA was 1.7 on 07/31/2016.  Chest CT on 06/28/2016 revealed extensive adenopathy.  There were multiple lymph nodes descending thoracic aorta, largest measuring 2.1 x 1.7 cm.  There was adenopathy in the aortopulmonary window with the largest confluence of lymph nodes measuring 4 x 2.6 cm. There was hilar adenopathy on the left measuring 2 x 1.9 cm. There were multiple enlarged lymph nodes in the pretracheal region, largest 1.7 x 1.7 cm.  There were lymph nodes to the left of the carina  measuring 2.8 x 1.9 cm. There was a subcarinal lymph node measuring 2.4 x 1.6 cm. There was a 4 x 3 mm nodular opacity in the anterior segment of the left upper lobe.    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  There was nodal metastasis within the chest and supraclavicular/low jugular regions.  There was multifocal osseous metastasis.  There was possibly a pathologic fracture involving the posteromedial right tenth rib.  There was new left lower lobe opacity with mild hypermetabolism, suspicious for infection or postobstructive pneumonitis.  He completed a course of Levaquin for apost-obstructive pneumonia.  He experienced transient positional left facial burning.  Head MRI on 07/26/2016 revealed no evidence of brain metastasis. There was punctate DWI  hyperintensity along a high right frontal gyrus, possible recent infarct.  There was a small occipital bone metastasis.  Head MRA on 07/27/2016 revealed normal large and medium sized vessels.  Bilateral carotid duplex on 07/27/2016 revealed <50% stenosis in the right and left internal carotis arteries.  Echo on 07/27/2016 revealed an EF of 45-50% without cardiac source of emboli.  He received 4 cycles of carboplatin and etoposide (07/31/2016 - 10/02/2016) with OnPro Neulasta support.  Platelet nadir was 40,000 on day 16 of cycle #2 and 23,000 on day 15 of cycle #3.  He received 2 units of PRBC with cycle #3 and 1 unit with cycle #4.  He received Xgeva on 08/13/2016 (last 10/30/2016).  Chest CT on 09/11/2016 revealed a marked response to therapy.  There was near complete resolution of left suprahilar mass and thoracic adenopathy.  There was interval sclerosis indicative of healing site of osseous metastasis.  Thoracic spine MRI on 10/22/2016 reveals sclerotic enhancing lesion at T7 vertebral body on the left compatible with metastasis. There was a healing fracture the right medial 10th rib.  PET  scan on 11/12/2016 revealed a new 10 mm hypermetabolic left axillary lymph node (SUV 3.6) due to a left hand abscess.  There were no other sites of metabolically active disease.  He completed a course of thoracic spine radiation (3000 cGy in 10 fractions) on 01/11/2017.  He received PCI (3000 cGy in 15 fractions) from 01/28/2017 - 02/15/2017.  Chest, abdomen, and pelvic CT on 03/18/2017 revealed new areas of irregular ground-glass in the medial aspects of the right upper and right lower lobes as well as somewhat amorphous ground-glass in the lingula and left lower lobe. Time interval favored infectious or inflammatory lesions.  Osseous metastatic disease was stable.  There was resolved or resected left axillary lymph node.  Bone scan on 04/10/2017 revealed multiple foci of skeletal metastasis within the ribs, pelvis, and RIGHT clavicle.  Multiple areas of activity corresponding to sclerotic lesions on CT and non-hypermetabolic lesions on PET scan.  PET scan on 04/26/2017 revealed bandlike densities medially in the right upper lobe and medially in the right lower lobe were new from 11/12/2016 but were present on 03/18/2017, and had faint hypermetabolic activity. Radiation pneumonitis could have this appearance.  The morphology would be unusual for malignancy but surveillance was warranted.  There were scattered stable sclerotic bony lesions that were not hypermetabolic.  There was persistent accentuated activity in the vicinity of the anus may be physiologic but was technically nonspecific.  He has chemotherapy induced anemia.  Anemia work-up on 09/11/2016 revealed the following normal labs:  Ferritin (219), iron sat (28%), TIBC (227), B12 (3055), and folate (6.8).  He has a history of left lower extremity DVT a few months after his cardiac surgery in 08/2014.  He has peripheral vascular disease s/p stent placement in 03/2015.  He is on a Plavix.  He has not had a colonoscopy in 30 years.  He was  ineligible for the AbbVie clinical trial (M16-298) secondary to his left hand abscess.  He underwent surgical debridement of a left hand abscess on 11/15/2016.  Wound cultures grew MRSA.  He was treated with emperic IV Vancomycin then Bactrim.   He has chronic nausea.  UGI on 06/14/2017 was normal.   Symptomatically, he has ongoing nausea and vomiting.  He is s/p a fall on 06/19/2017 following a syncopal event.  He hit the left side of his body and has associated tenderness in the left maxofacial area,  neck, and arm.  He has a facial abrasion with overlying purulence.  Neurologic exam is normal.  WBC is 3300 with an ANC of 2200.  Potassium is 3.4.  Magnesium  Is 1.4.  Plan: 1.  Labs today:  CBC with diff, BMP, Mg. 2.  Discuss low magnesium of 1.4. Anticipate replacement in ER. 3.  Discuss low potassium.  Increase potassium from 10 meq a day to 10 meq BID. 4.  Discuss ongoing nausea and normal UGI.  Follow-up with GI next week as scheduled. 5.  Discuss pain management. Patient has not heard from pain management clinic. Will assist with consult.  6.  Discuss recent syncopal episode and trauma.  Will need ER evaluation for head, neck, and left upper arm imaging. 7.  RTC in 1 week for NP assessment, review of GI consult, labs (BMP, Mg), and +/- IV Mg.   Lequita Asal, MD  06/21/2017, 4:35 PM

## 2017-06-21 NOTE — ED Notes (Signed)
Pt reports falling in his living room on Wednesday. Pt is taking Plavix for hx CABG. Did not want to come to ER but was encouraged by cancer center staff today to come get checked out. Pt has abrasion to left temple area and is complaining of 7/10 neck pain s/p fall.

## 2017-06-21 NOTE — Progress Notes (Unsigned)
Pt reports falling on wed and blacking out, complaining of neck pain.   Atlee Abide NP notified.

## 2017-06-21 NOTE — Progress Notes (Signed)
Patient states he "blacked out" Wednesday evening and hit the left side of his head on a rocker.  Since then he has had pain in his neck 7/10.  Wants to know if MD can send him for X-ray.  Did not seek medical attention at time of incident.  Also asking if MD will give pain medication (enough for a few days).  States he has not heard anything from the pain center regarding an appointment. Continues to be nauseated and unable to keep anything down.  States his appetite is at 25%.  Patient presents today with his significant other and is using a walker.

## 2017-06-28 ENCOUNTER — Inpatient Hospital Stay: Payer: Medicaid Other

## 2017-06-28 ENCOUNTER — Other Ambulatory Visit: Payer: Self-pay | Admitting: *Deleted

## 2017-06-28 ENCOUNTER — Inpatient Hospital Stay (HOSPITAL_BASED_OUTPATIENT_CLINIC_OR_DEPARTMENT_OTHER): Payer: Medicaid Other | Admitting: Urgent Care

## 2017-06-28 VITALS — BP 119/78 | HR 91 | Temp 97.8°F | Resp 18 | Wt 125.1 lb

## 2017-06-28 DIAGNOSIS — C801 Malignant (primary) neoplasm, unspecified: Secondary | ICD-10-CM

## 2017-06-28 DIAGNOSIS — I739 Peripheral vascular disease, unspecified: Secondary | ICD-10-CM

## 2017-06-28 DIAGNOSIS — D6481 Anemia due to antineoplastic chemotherapy: Secondary | ICD-10-CM

## 2017-06-28 DIAGNOSIS — T451X5S Adverse effect of antineoplastic and immunosuppressive drugs, sequela: Secondary | ICD-10-CM

## 2017-06-28 DIAGNOSIS — C771 Secondary and unspecified malignant neoplasm of intrathoracic lymph nodes: Secondary | ICD-10-CM

## 2017-06-28 DIAGNOSIS — M545 Low back pain: Secondary | ICD-10-CM

## 2017-06-28 DIAGNOSIS — E785 Hyperlipidemia, unspecified: Secondary | ICD-10-CM

## 2017-06-28 DIAGNOSIS — E876 Hypokalemia: Secondary | ICD-10-CM

## 2017-06-28 DIAGNOSIS — R42 Dizziness and giddiness: Secondary | ICD-10-CM

## 2017-06-28 DIAGNOSIS — C3412 Malignant neoplasm of upper lobe, left bronchus or lung: Secondary | ICD-10-CM

## 2017-06-28 DIAGNOSIS — I2510 Atherosclerotic heart disease of native coronary artery without angina pectoris: Secondary | ICD-10-CM

## 2017-06-28 DIAGNOSIS — I252 Old myocardial infarction: Secondary | ICD-10-CM

## 2017-06-28 DIAGNOSIS — Z79899 Other long term (current) drug therapy: Secondary | ICD-10-CM

## 2017-06-28 DIAGNOSIS — E878 Other disorders of electrolyte and fluid balance, not elsewhere classified: Secondary | ICD-10-CM

## 2017-06-28 DIAGNOSIS — Z86718 Personal history of other venous thrombosis and embolism: Secondary | ICD-10-CM

## 2017-06-28 DIAGNOSIS — F17210 Nicotine dependence, cigarettes, uncomplicated: Secondary | ICD-10-CM

## 2017-06-28 DIAGNOSIS — C3490 Malignant neoplasm of unspecified part of unspecified bronchus or lung: Secondary | ICD-10-CM

## 2017-06-28 DIAGNOSIS — Z800 Family history of malignant neoplasm of digestive organs: Secondary | ICD-10-CM

## 2017-06-28 DIAGNOSIS — R112 Nausea with vomiting, unspecified: Secondary | ICD-10-CM

## 2017-06-28 DIAGNOSIS — R55 Syncope and collapse: Secondary | ICD-10-CM

## 2017-06-28 DIAGNOSIS — Z951 Presence of aortocoronary bypass graft: Secondary | ICD-10-CM

## 2017-06-28 DIAGNOSIS — C7951 Secondary malignant neoplasm of bone: Secondary | ICD-10-CM

## 2017-06-28 DIAGNOSIS — E871 Hypo-osmolality and hyponatremia: Secondary | ICD-10-CM

## 2017-06-28 DIAGNOSIS — E860 Dehydration: Secondary | ICD-10-CM

## 2017-06-28 DIAGNOSIS — W19XXXS Unspecified fall, sequela: Secondary | ICD-10-CM

## 2017-06-28 DIAGNOSIS — Z923 Personal history of irradiation: Secondary | ICD-10-CM

## 2017-06-28 DIAGNOSIS — I119 Hypertensive heart disease without heart failure: Secondary | ICD-10-CM

## 2017-06-28 DIAGNOSIS — Z9221 Personal history of antineoplastic chemotherapy: Secondary | ICD-10-CM | POA: Diagnosis not present

## 2017-06-28 DIAGNOSIS — R531 Weakness: Secondary | ICD-10-CM

## 2017-06-28 DIAGNOSIS — R634 Abnormal weight loss: Secondary | ICD-10-CM

## 2017-06-28 DIAGNOSIS — M542 Cervicalgia: Secondary | ICD-10-CM

## 2017-06-28 DIAGNOSIS — S40022S Contusion of left upper arm, sequela: Secondary | ICD-10-CM

## 2017-06-28 DIAGNOSIS — Z8614 Personal history of Methicillin resistant Staphylococcus aureus infection: Secondary | ICD-10-CM

## 2017-06-28 DIAGNOSIS — Z801 Family history of malignant neoplasm of trachea, bronchus and lung: Secondary | ICD-10-CM

## 2017-06-28 DIAGNOSIS — R5383 Other fatigue: Secondary | ICD-10-CM

## 2017-06-28 DIAGNOSIS — G8929 Other chronic pain: Secondary | ICD-10-CM

## 2017-06-28 DIAGNOSIS — R630 Anorexia: Secondary | ICD-10-CM

## 2017-06-28 LAB — BASIC METABOLIC PANEL
Anion gap: 8 (ref 5–15)
BUN: 7 mg/dL (ref 6–20)
CO2: 25 mmol/L (ref 22–32)
Calcium: 8.1 mg/dL — ABNORMAL LOW (ref 8.9–10.3)
Chloride: 95 mmol/L — ABNORMAL LOW (ref 101–111)
Creatinine, Ser: 0.93 mg/dL (ref 0.61–1.24)
GFR calc Af Amer: >60 mL/min (ref >60)
GFR calc non Af Amer: >60 mL/min (ref >60)
Glucose, Bld: 96 mg/dL (ref 65–99)
Potassium: 3.3 mmol/L — ABNORMAL LOW (ref 3.5–5.1)
Sodium: 128 mmol/L — ABNORMAL LOW (ref 135–145)

## 2017-06-28 LAB — MAGNESIUM: Magnesium: 1.4 mg/dL — ABNORMAL LOW (ref 1.7–2.4)

## 2017-06-28 LAB — ALBUMIN: Albumin: 2.5 g/dL — ABNORMAL LOW (ref 3.5–5.0)

## 2017-06-28 MED ORDER — HEPARIN SOD (PORK) LOCK FLUSH 100 UNIT/ML IV SOLN
500.0000 [IU] | Freq: Once | INTRAVENOUS | Status: AC
  Administered 2017-06-28: 500 [IU] via INTRAVENOUS

## 2017-06-28 MED ORDER — SODIUM CHLORIDE 0.9 % IV SOLN
Freq: Once | INTRAVENOUS | Status: DC
  Filled 2017-06-28: qty 1000

## 2017-06-28 MED ORDER — SODIUM CHLORIDE 0.9 % IV SOLN
Freq: Once | INTRAVENOUS | Status: AC
  Administered 2017-06-28: 11:00:00 via INTRAVENOUS
  Filled 2017-06-28: qty 1000

## 2017-06-28 NOTE — Progress Notes (Signed)
[facility] day:  06/21/2017   Chief Complaint: Philip [name] is a 57 y.o. male with extensive stage small cell lung cancer who is seen for 1 week assessment.  HPI:  The patient was last seen in the medical oncology clinic on 06/21/2017.  At that time, felt "ok", but noted generalized weakness.  Patient made mention of a recent syncopal episode on 06/19/2017 that lasted approximately 30 seconds.  Patient failed striking his head, neck, and left arm.  Exam revealed bruising to the left upper extremity, and left side of his face. Neurological exam was normal.  WBC 3300 with an ANC of 2200.  Potassium 3.4.  Magnesium 1.4.  Patient was sent to the ER for evaluation.  Patient was seen in the ER by Dr. Harvest Dark on 06/21/2017.  Workup included CT imaging of the patient's head and cervical spine that was negative for acute abnormalities.  Radiology did make note of the sclerotic lesion throughout the C5 vertebral body consistent with metastatic disease is unchanged since the most recent comparison exam. EKG and cardiac labs also negative. Patient's magnesium was NOT replaced in the ED. Patient was subsequently discharged home on a short course of Roxicodone 5 mg every 8 hours PRN (Disp #12).   Patient is scheduled to follow-up with cardiology Fletcher Anon) on 07/05/2017 after most recent ED visit.  Appointment scheduled to follow-up on abnormal ABIs.   Patient continues to note that he has not heard from pain management referral.  Scheduling has rescheduled this appointment for patient on a few different occasions at this point.  Symptomatically, patient feels weak and tired "all the time".  He complains of difficulty walking and chronic lower back pain.  Patient complains of pain in his legs (L>R) related to a known vascular issue.  He is scheduled to follow-up with cardiology next week for further assessment.  Pain in the clinic is rated 6/10.  Patient notes  poor oral intake.  His weight is down another 2 pounds since his last visit to the clinic.  Patient states, "when I eat something it gets to the bottom of my esophagus and gets stuck.  This causes a lot of pain and nausea.  Whenever I eat ends up coming back up".  Of note, patient had a negative upper GI on 06/14/2017. He has been referred to GI for further assessment. His initial appointment was scheduled with Dr. Allen Norris on 06/17/2017, however it was cancelled by the patient. Appointment has been rescheduled for 07/29/2017.  Patient denies any increased shortness of breath.  He has no fevers, sweats, or appreciable adenopathy.   Past Medical History:  Diagnosis Date  . Anemia   . CAD (coronary artery disease)    a. 08/2014 Inf STEMI/CABG x 3 (LIMA->LAD, VG->Diag, RIMA->RCA).  . DVT, recurrent, lower extremity, acute, left ([place]) 2016  . GIB (gastrointestinal bleeding)    a. In the setting of DAPT-->tolerating plavix only.  . Hyperlipidemia   . Hypertensive heart disease   . Lung cancer ([place])    a. s/p chemo/radiation.  Marland Kitchen PAD (peripheral artery disease) ([place])    a. 03/2015 Periph Angio: short occlusion of L pop w/ evidence of embolization into the DP->5.0x50 mm Inova self-expanding stent;  b. 04/08/2015 ABI: R 1.12, L 0.99; c. 01/2017 LE Duplex: bilat SFA obstructive dzs, patent L pop.  . Tobacco abuse   . Toe amputation status, left [facility]) 07/09/2016   2017    Past Surgical History:  Procedure Laterality Date  . CARDIAC CATHETERIZATION N/A 08/24/2014   Procedure: Left Heart Cath and Coronary Angiography;  Surgeon: Isaias Cowman, MD;  Location: [facility];  Service: Cardiovascular;  Laterality: N/A;  . CARDIAC CATHETERIZATION N/A 08/24/2014   Procedure: Coronary Stent Intervention;  Surgeon: Isaias Cowman, MD;  Location: [facility];  Service: Cardiovascular;  Laterality: N/A;  . CORONARY ARTERY BYPASS GRAFT N/A 08/25/2014   Procedure: CORONARY ARTERY BYPASS GRAFTING  (CABG), ON PUMP, TIMES THREE, USING BILATERAL MAMMARY ARTERIES, RIGHT GREATER SAPHENOUS VEIN HARVESTED ENDOSCOPICALLY;  Surgeon: Melrose Nakayama, MD;  Location: [facility];  Service: Open Heart Surgery;  Laterality: N/A;  . INCISION AND DRAINAGE ABSCESS Left 11/15/2016   Procedure: INCISION AND DRAINAGE ABSCESS OF LEFT WRIST;  Surgeon: Roseanne Kaufman, MD;  Location: [facility];  Service: Orthopedics;  Laterality: Left;  . PERIPHERAL VASCULAR CATHETERIZATION N/A 03/30/2015   Procedure: Abdominal Aortogram w/Lower Extremity;  Surgeon: Wellington Hampshire, MD;  Location: [facility];  Service: Cardiovascular;  Laterality: N/A;  . PORTA CATH INSERTION N/A 07/27/2016   Procedure: Glori Luis Cath Insertion;  Surgeon: Algernon Huxley, MD;  Location: [facility];  Service: Cardiovascular;  Laterality: N/A;  . TEE WITHOUT CARDIOVERSION N/A 08/25/2014   Procedure: TRANSESOPHAGEAL ECHOCARDIOGRAM (TEE);  Surgeon: Melrose Nakayama, MD;  Location: [facility];  Service: Open Heart Surgery;  Laterality: N/A;    Family History  Problem Relation Age of Onset  . Hypertension Father   . Lung cancer Father   . Heart attack Mother 74       STENT  . Hyperlipidemia Mother   . Heart attack Brother   . Heart attack Brother   . Breast cancer Sister   . Heart attack Sister   . Colon cancer Paternal Grandfather     Social History:  reports that he has been smoking cigarettes.  He has been smoking about 0.50 packs per day. he has never used smokeless tobacco. He reports that he drinks alcohol. He reports that he does not use drugs.  The patient smokes 1/2 pack of cigarettes/day (previously 2 1/2 ppd).  He started smoking in his teens.  He denies any exposure to radiation or toxins.  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  He lives in [place].  His wife is named Publishing copy.  He is accompanied by his wife today.  Allergies:  Allergies  Allergen Reactions  . Tylenol [Acetaminophen] Palpitations  .  Amitriptyline Other (See Comments)    Burning sensation all over  . Aspirin   . Contrast Media [Iodinated Diagnostic Agents] Other (See Comments)    Pt states that he got "knots behind his ears"  . Nsaids Other (See Comments)    Blood in stools  . Ibuprofen Other (See Comments) and Nausea Only    Blood in stools  . Tape Rash and Other (See Comments)    PLEASE USE COBAN WRAP; THE PATIENT'S SKIN TEARS WHEN BANDAGES ARE REMOVED!!    Current Medications: Current Outpatient Medications  Medication Sig Dispense Refill  . atorvastatin (LIPITOR) 40 MG tablet TAKE 1 TABLET BY MOUTH DAILY 90 tablet 3  . benzonatate (TESSALON) 100 MG capsule TK 1 C PO TID PRN  2  . buPROPion (WELLBUTRIN XL) 150 MG 24 hr tablet TK 1 T PO QD  5  . Calcium Carb-Cholecalciferol (CALCIUM + D3 PO) Take 1 tablet by mouth 3 (three) times daily.    . clopidogrel (PLAVIX)  75 MG tablet Take 1 tablet (75 mg total) by mouth daily. 90 tablet 3  . diphenhydrAMINE (BENADRYL) 25 mg capsule Take 25 mg by mouth 2 (two) times daily as needed for allergies.     Marland Kitchen FLOVENT HFA 220 MCG/ACT inhaler INL 2 PFS PO BID  5  . furosemide (LASIX) 20 MG tablet Take 20 mg daily by mouth.  1  . lidocaine-prilocaine (EMLA) cream Apply to affected area once (Patient taking differently: Apply 1 application topically See admin instructions. TO AFFECTED AREA AS DIRECTED) 30 g 3  . lisinopril (PRINIVIL,ZESTRIL) 5 MG tablet TAKE 1 TABLET(5 MG) BY MOUTH DAILY 90 tablet 3  . magnesium oxide (MAG-OX) 400 (241.3 Mg) MG tablet Take 1 tablet (400 mg total) by mouth 3 (three) times daily. 90 tablet 3  . metoprolol tartrate (LOPRESSOR) 25 MG tablet TAKE 1 TABLET BY MOUTH TWICE DAILY 180 tablet 3  . ondansetron (ZOFRAN) 8 MG tablet TAKE 1 TABLET BY MOUTH EVERY 8 HOURS AS NEEDED FOR NAUSEA OR VOMITING 30 tablet 0  . pantoprazole (PROTONIX) 40 MG tablet TK 1 T PO QD  5  . potassium chloride (K-DUR) 10 MEQ tablet Take 1 tablet (10 mEq total) by mouth daily. 90 tablet  1  . PROAIR HFA 108 (90 Base) MCG/ACT inhaler INL 2 PFS PO Q 4 TO 6 H PRN  5  . promethazine (PHENERGAN) 25 MG suppository Place 1 suppository (25 mg total) rectally every 6 (six) hours as needed for nausea or vomiting. 12 each 5  . STIOLTO RESPIMAT 2.5-2.5 MCG/ACT AERS INL 2 PFS PO QD  5  . Vitamin D, Ergocalciferol, (DRISDOL) 50000 units CAPS capsule TK ONE C PO Q WEEK  5  . fluconazole (DIFLUCAN) 100 MG tablet Take 1 tablet (100 mg total) daily by mouth. (Patient not taking: Reported on 06/13/2017) 7 tablet 3   No current facility-administered medications for this visit.    Facility-Administered Medications Ordered in Other Visits  Medication Dose Route Frequency Provider Last Rate Last Dose  . heparin lock flush 100 unit/mL  500 Units Intravenous Once Lequita Asal, MD        Review of Systems:  GENERAL:  Feels "weak". No fevers or sweats.  Weight down 2 pounds.  PERFORMANCE STATUS (ECOG):  2 HEENT:  No visual changes, runny nose, sore throat, mouth sores or tenderness. Lungs: No shortness of breath or cough.  No hemoptysis. Cardiac:  No chest pain, palpitations, orthopnea, or PND. GI:  Nausea and vomiting continue. Poor appetite as food bolus becomes "stuck" in distal esophagus. No diarrhea, constipation, melena or hematochezia.   GU:  No urgency, frequency, dysuria, or hematuria. Musculoskeletal:  Low back pain (chronic). Neck and face pain s/p fall.  No joint pain.  No muscle tenderness. Extremities:  Lower extremity pain secondary to peripheral vascular disease.  No swelling. Skin:  Left facial abrasion; improved.  No rashes or skin changes. Neuro:  No headache, numbness or weakness, balance or coordination issues. Endocrine:  No diabetes, thyroid issues, hot flashes or night sweats. Psych:  No mood changes, depression or anxiety. Pain: 6/10 - chronic back pain and pain in lower extremities.   Review of systems:  All other systems reviewed and found to be  negative.  Physical Exam: Blood pressure 119/78, pulse 91, temperature 97.8 F (36.6 C), temperature source Tympanic, resp. rate 18, weight 125 lb 1 oz (56.7 kg). GENERAL:  Thin gentleman sitting comfortably in the exam room in no acute distress.  He has a cane at his side. MENTAL STATUS: Alert and oriented to person, place and time. HEAD:Wearing a cap.  Normocephalic, atraumatic, face symmetric.  Cushingoid features. EYES:[place]. Pupils equal round and reactive to light and accomodation. No conjunctivitis or scleral icterus. OXB:DZHGDJMEQA clear without lesion. Tonguenormal. Poor dentition. Mucous membranes moist. NECK:  Tender to palpation. RESPIRATORY:Clear to auscultationwithout rales, wheezes or rhonchi. CARDIOVASCULAR:Regular rate andrhythmwithout murmur, rub or gallop. ABDOMEN:Soft, non-tender, with active bowel sounds, and no hepatosplenomegaly. No masses. SKIN: Left maxillary area slightly erythematous/edematous with overlying yellow material/purulence at site of abrasion/laceration.  Left upper extremity ecchymosis. EXTREMITIES: Left upper extremity tender.  No lower extremity edema, no skin discoloration or tenderness. No palpable cords.  LYMPHNODES: No palpable cervical, supraclavicular, axillary or inguinal adenopathy  NEUROLOGICAL: Alert & oriented, cranial nerves II-XII intact; motor strength 5/5 throughout; sensation intact; finger to nose and RAM normal. PSYCH: Appropriate.   Appointment on 06/28/2017  Component Date Value Ref Range Status  . Magnesium 06/28/2017 1.4* 1.7 - 2.4 mg/dL Final   Performed at [facility], [address] [place], [place] [address]  . Sodium 06/28/2017 128* 135 - 145 mmol/L Final  . Potassium 06/28/2017 3.3* 3.5 - 5.1 mmol/L Final  . Chloride 06/28/2017 95* 101 - 111 mmol/L Final  . CO2 06/28/2017 25  22 - 32 mmol/L Final  . Glucose, Bld 06/28/2017 96  65 - 99 mg/dL Final  . BUN 06/28/2017 7  6 - 20  mg/dL Final  . Creatinine, Ser 06/28/2017 0.93  0.61 - 1.24 mg/dL Final  . Calcium 06/28/2017 8.1* 8.9 - 10.3 mg/dL Final  . GFR calc non Af Amer 06/28/2017 >60  >60 mL/min Final  . GFR calc Af Amer 06/28/2017 >60  >60 mL/min Final   Comment: (NOTE) The eGFR has been calculated using the CKD EPI equation. This calculation has not been validated in all clinical situations. eGFR's persistently <60 mL/min signify possible Chronic Kidney Disease.   Georgiann Hahn gap 06/28/2017 8  5 - 15 Final   Performed at [facility], [address] [place], [place] [address]  . Albumin 06/28/2017 2.5* 3.5 - 5.0 g/dL Final   Performed at [facility], [address] [place], [place] [address]    Assessment:  Philip [name] is a 57 y.o. male with extensive stage small cell lung cancer.  He presented with a 2 year history of night sweats and a 36 pound weight loss in 6 months.  Right supraclavicular biopsy on 07/19/2016 revealed small cell carcinoma of the lung.  CEA was 1.7 on 07/31/2016.  Chest CT on 06/28/2016 revealed extensive adenopathy.  There were multiple lymph nodes descending thoracic aorta, largest measuring 2.1 x 1.7 cm.  There was adenopathy in the aortopulmonary window with the largest confluence of lymph nodes measuring 4 x 2.6 cm. There was hilar adenopathy on the left measuring 2 x 1.9 cm. There were multiple enlarged lymph nodes in the pretracheal region, largest 1.7 x 1.7 cm.  There were lymph nodes to the left of the carina measuring 2.8 x 1.9 cm. There was a subcarinal lymph node measuring 2.4 x 1.6 cm. There was a 4 x 3 mm nodular opacity in the anterior segment of the left upper lobe.    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  There was nodal metastasis within the chest and supraclavicular/low jugular regions.  There was multifocal osseous metastasis.  There was possibly a pathologic fracture involving the  posteromedial right tenth rib.  There was new left lower lobe opacity with mild hypermetabolism, suspicious for infection or postobstructive pneumonitis.  He completed a course of Levaquin for apost-obstructive pneumonia.  He experienced transient positional left facial burning.  Head MRI on 07/26/2016 revealed no evidence of brain metastasis. There was punctate DWI hyperintensity along a high right frontal gyrus, possible recent infarct.  There was a small occipital bone metastasis.  Head MRA on 07/27/2016 revealed normal large and medium sized vessels.  Bilateral carotid duplex on 07/27/2016 revealed <50% stenosis in the right and left internal carotis arteries.  Echo on 07/27/2016 revealed an EF of 45-50% without cardiac source of emboli.  He received 4 cycles of carboplatin and etoposide (07/31/2016 - 10/02/2016) with OnPro Neulasta support.  Platelet nadir was 40,000 on day 16 of cycle #2 and 23,000 on day 15 of cycle #3.  He received 2 units of PRBC with cycle #3 and 1 unit with cycle #4.  He received Xgeva on 08/13/2016 (last 10/30/2016).  Chest CT on 09/11/2016 revealed a marked response to therapy.  There was near complete resolution of left suprahilar mass and thoracic adenopathy.  There was interval sclerosis indicative of healing site of osseous metastasis.  Thoracic spine MRI on 10/22/2016 reveals sclerotic enhancing lesion at T7 vertebral body on the left compatible with metastasis. There was a healing fracture the right medial 10th rib.  PET scan on 11/12/2016 revealed a new 10 mm hypermetabolic left axillary lymph node (SUV 3.6) due to a left hand abscess.  There were no other sites of metabolically active disease.  He completed a course of thoracic spine radiation (3000 cGy in 10 fractions) on 01/11/2017.  He received PCI (3000 cGy in 15 fractions) from 01/28/2017 - 02/15/2017.  Chest, abdomen, and pelvic CT on 03/18/2017 revealed new areas of irregular ground-glass in the medial  aspects of the right upper and right lower lobes as well as somewhat amorphous ground-glass in the lingula and left lower lobe. Time interval favored infectious or inflammatory lesions.  Osseous metastatic disease was stable.  There was resolved or resected left axillary lymph node.  Bone scan on 04/10/2017 revealed multiple foci of skeletal metastasis within the ribs, pelvis, and RIGHT clavicle.  Multiple areas of activity corresponding to sclerotic lesions on CT and non-hypermetabolic lesions on PET scan.  PET scan on 04/26/2017 revealed bandlike densities medially in the right upper lobe and medially in the right lower lobe were new from 11/12/2016 but were present on 03/18/2017, and had faint hypermetabolic activity. Radiation pneumonitis could have this appearance.  The morphology would be unusual for malignancy but surveillance was warranted.  There were scattered stable sclerotic bony lesions that were not hypermetabolic.  There was persistent accentuated activity in the vicinity of the anus may be physiologic but was technically nonspecific.  He has chemotherapy induced anemia.  Anemia work-up on 09/11/2016 revealed the following normal labs:  Ferritin (219), iron sat (28%), TIBC (227), B12 (3055), and folate (6.8).  He has a history of left lower extremity DVT a few months after his cardiac surgery in 08/2014.  He has peripheral vascular disease s/p stent placement in 03/2015.  He is on a Plavix.  He has not had a colonoscopy in 30 years.  He was ineligible for the AbbVie clinical trial ([id number]) secondary to his left hand abscess.  He underwent surgical debridement of a left hand abscess on 11/15/2016.  Wound cultures grew MRSA.  He was treated with emperic IV Vancomycin  then Bactrim.   He has chronic nausea.  UGI on 06/14/2017 was normal.   Symptomatically, patient is "weak and tired". He has ongoing nausea and vomiting. He continues to lose weight secondary to poor intake. Food boluses  become "stuck" in his distal esophagus, which causes him to vomit. Negative UGI earlier this month. Has been referred to GI. Exam is stable. Magnesium low at 1.4 sodium low at 128.Marland Kitchen  Potassium low at 3.3.  Calcium low at 8.1.  Plan: 1.  Labs today:  BMP, Mg, albumin 2.  Discuss HYPOcalcemia. Calcium 8.1. Albumin level added and found to be low at 2.5. Corrected calcium 9.3. Patient to continue calcium supplement at home. 3.  Discuss low magnesium of 1.4.  Will replace with 2 grams IV magnesium today.  Patient to continue Mag-Ox 400 mg 3 times daily at home. 3.  Discuss low potassium 3.3.  Will replace with 83mq of IV potassium today.  Patient to continue K-dur 10 mEq twice daily at home. 4.  Discuss poor oral fluid intake.  Patient feels weak and tired.  Will give 1 L of 0.9% NS today. 5.  Discuss pain and symptom management.  Patient encouraged to contact pain clinic to reschedule his appointment.  He will continue to use the prescribed antiemetics. 6.  Discuss nutrition and weight loss.  Patient encouraged to supplement diet with Ensure/Boost  shakes 2-3 times a day. 7.  Discuss GI follow-up.  Patient likely did continue to have issues with nausea, vomiting, weight loss, and the sensation of lodged esophageal food boluses until he gets into see GI.  At this point, his appointment is not until sometime in April.  Will place a call to Dr. WDorothey Basemanoffice to see if patient's appointment date can be expedited. 8.  RTC on 08/02/2017 for MD assessment, review of GI consult, labs (CBC with diff, BMP, Mg), and +/- IV Mg.   BHonor Loh NP  06/28/17, 5:37 PM

## 2017-06-28 NOTE — Progress Notes (Signed)
Patient continues to complain of pain.  States he has heard nothing from Pain Center.  Informed him that on several occassions appointments were made and he cancelled.  Advised him to call himself to make appointment.  Provided him with phone number.

## 2017-07-05 ENCOUNTER — Encounter: Payer: Self-pay | Admitting: Cardiovascular Disease

## 2017-07-05 ENCOUNTER — Ambulatory Visit (INDEPENDENT_AMBULATORY_CARE_PROVIDER_SITE_OTHER): Payer: Medicaid Other | Admitting: Cardiovascular Disease

## 2017-07-05 VITALS — BP 112/83 | HR 94 | Ht 71.0 in | Wt 122.5 lb

## 2017-07-05 DIAGNOSIS — T451X5A Adverse effect of antineoplastic and immunosuppressive drugs, initial encounter: Secondary | ICD-10-CM | POA: Diagnosis not present

## 2017-07-05 DIAGNOSIS — I10 Essential (primary) hypertension: Secondary | ICD-10-CM | POA: Diagnosis not present

## 2017-07-05 DIAGNOSIS — E785 Hyperlipidemia, unspecified: Secondary | ICD-10-CM

## 2017-07-05 DIAGNOSIS — I739 Peripheral vascular disease, unspecified: Secondary | ICD-10-CM

## 2017-07-05 DIAGNOSIS — I2510 Atherosclerotic heart disease of native coronary artery without angina pectoris: Secondary | ICD-10-CM

## 2017-07-05 DIAGNOSIS — G620 Drug-induced polyneuropathy: Secondary | ICD-10-CM

## 2017-07-05 MED ORDER — GABAPENTIN 300 MG PO CAPS: 300.0000 mg | ORAL_CAPSULE | Freq: Three times a day (TID) | ORAL | 3 refills | Status: DC

## 2017-07-05 NOTE — Progress Notes (Signed)
Cardiology Office Note   Date:  07/05/2017   ID:  Philip [name], DOB 11/19/1960, MRN [id number]  PCP:  Dr. Iona Beard  Cardiologist:   Kathlyn Sacramento, MD   Chief Complaint  Patient presents with  . other    Follow up from syncope/fall and discuss test results of ABI's. Meds reviewed by the pt. verbally. Pt. c/o bilateral leg pain with numbness & pain in toes.       History of Present Illness: Philip [name] is a 57 y.o. male who presents for a follow-up visit regarding coronary artery disease and peripheral arterial disease.  The patient has known history of coronary artery disease. He presented with inferior STEMI 08/24/2014. Cardiac catheterization revealed a complex 90% stenosis proximal LAD, 95% stenosis mid LAD, 70% stenosis D1, and 75% stenosis mid RCA. The patient was transferred to [facility] where he underwent CABG x3, with LIMA to LAD, SVG 2D 1, and RIMA to RCA. He has other medical problems that include tobacco use, hypertension and hyperlipidemia. He has known history of peripheral arterial disease with previous left popliteal artery self-expanding stent placement due to rest pain and embolization into the dorsalis pedis. He had GI bleed on dual antiplatelet therapy and currently on clopidogrel without aspirin. He has metastatic small cell lung cancer treated with radiation and chemotherapy.  Since his diagnosis, he lost 70 pounds with significant generalized weakness.  He reports a lot of GI symptoms and is supposed to follow-up with gastroenterology for evaluation.  He has no appetite.  He was seen recently by [facility] for worsening bilateral leg pain. He describes calf claudication worse on the left side.  However, the majority of his symptoms seem to be foot numbness with burning sensation especially at night.  Sometimes it is hard for him to put socks on. He underwent noninvasive vascular evaluation in October which showed normal ABI on the right side mildly  reduced on the left side with patent left popliteal artery stent and significant stenosis in the mid left SFA.  Past Medical History:  Diagnosis Date  . Anemia   . CAD (coronary artery disease)    a. 08/2014 Inf STEMI/CABG x 3 (LIMA->LAD, VG->Diag, RIMA->RCA).  . DVT, recurrent, lower extremity, acute, left ([place]) 2016  . GIB (gastrointestinal bleeding)    a. In the setting of DAPT-->tolerating plavix only.  . Hyperlipidemia   . Hypertensive heart disease   . Lung cancer ([place])    a. s/p chemo/radiation.  Marland Kitchen PAD (peripheral artery disease) ([place])    a. 03/2015 Periph Angio: short occlusion of L pop w/ evidence of embolization into the DP->5.0x50 mm Inova self-expanding stent;  b. 04/08/2015 ABI: R 1.12, L 0.99; c. 01/2017 LE Duplex: bilat SFA obstructive dzs, patent L pop.  . Tobacco abuse   . Toe amputation status, left [facility]) 07/09/2016   2017    Past Surgical History:  Procedure Laterality Date  . CARDIAC CATHETERIZATION N/A 08/24/2014   Procedure: Left Heart Cath and Coronary Angiography;  Surgeon: Isaias Cowman, MD;  Location: [place] CV LAB;  Service: Cardiovascular;  Laterality: N/A;  . CARDIAC CATHETERIZATION N/A 08/24/2014   Procedure: Coronary Stent Intervention;  Surgeon: Isaias Cowman, MD;  Location: [place] CV LAB;  Service: Cardiovascular;  Laterality: N/A;  . CORONARY ARTERY BYPASS GRAFT N/A 08/25/2014   Procedure: CORONARY ARTERY BYPASS GRAFTING (CABG), ON PUMP, TIMES THREE, USING BILATERAL MAMMARY ARTERIES, RIGHT GREATER SAPHENOUS VEIN HARVESTED ENDOSCOPICALLY;  Surgeon: Melrose Nakayama, MD;  Location: [facility];  Service: Open Heart Surgery;  Laterality: N/A;  . INCISION AND DRAINAGE ABSCESS Left 11/15/2016   Procedure: INCISION AND DRAINAGE ABSCESS OF LEFT WRIST;  Surgeon: Roseanne Kaufman, MD;  Location: [place];  Service: Orthopedics;  Laterality: Left;  . PERIPHERAL VASCULAR CATHETERIZATION N/A 03/30/2015   Procedure: Abdominal Aortogram w/Lower  Extremity;  Surgeon: Wellington Hampshire, MD;  Location: [place] CV LAB;  Service: Cardiovascular;  Laterality: N/A;  . PORTA CATH INSERTION N/A 07/27/2016   Procedure: Glori Luis Cath Insertion;  Surgeon: Algernon Huxley, MD;  Location: [place] CV LAB;  Service: Cardiovascular;  Laterality: N/A;  . TEE WITHOUT CARDIOVERSION N/A 08/25/2014   Procedure: TRANSESOPHAGEAL ECHOCARDIOGRAM (TEE);  Surgeon: Melrose Nakayama, MD;  Location: [place];  Service: Open Heart Surgery;  Laterality: N/A;     Current Outpatient Medications  Medication Sig Dispense Refill  . atorvastatin (LIPITOR) 40 MG tablet TAKE 1 TABLET BY MOUTH DAILY 90 tablet 3  . benzonatate (TESSALON) 100 MG capsule TK 1 C PO TID PRN  2  . buPROPion (WELLBUTRIN XL) 150 MG 24 hr tablet TK 1 T PO QD  5  . Calcium Carb-Cholecalciferol (CALCIUM + D3 PO) Take 1 tablet by mouth 3 (three) times daily.    . clopidogrel (PLAVIX) 75 MG tablet Take 1 tablet (75 mg total) by mouth daily. 90 tablet 3  . diphenhydrAMINE (BENADRYL) 25 mg capsule Take 25 mg by mouth 2 (two) times daily as needed for allergies.     Marland Kitchen FLOVENT HFA 220 MCG/ACT inhaler INL 2 PFS PO BID  5  . fluconazole (DIFLUCAN) 100 MG tablet Take 1 tablet (100 mg total) daily by mouth. 7 tablet 3  . furosemide (LASIX) 20 MG tablet Take 20 mg daily by mouth.  1  . lidocaine-prilocaine (EMLA) cream Apply to affected area once (Patient taking differently: Apply 1 application topically See admin instructions. TO AFFECTED AREA AS DIRECTED) 30 g 3  . lisinopril (PRINIVIL,ZESTRIL) 5 MG tablet TAKE 1 TABLET(5 MG) BY MOUTH DAILY 90 tablet 3  . magnesium oxide (MAG-OX) 400 (241.3 Mg) MG tablet Take 1 tablet (400 mg total) by mouth 3 (three) times daily. 90 tablet 3  . metoprolol tartrate (LOPRESSOR) 25 MG tablet TAKE 1 TABLET BY MOUTH TWICE DAILY 180 tablet 3  . ondansetron (ZOFRAN) 8 MG tablet TAKE 1 TABLET BY MOUTH EVERY 8 HOURS AS NEEDED FOR NAUSEA OR VOMITING 30 tablet 0  . pantoprazole  (PROTONIX) 40 MG tablet TK 1 T PO QD  5  . potassium chloride (K-DUR) 10 MEQ tablet Take 1 tablet (10 mEq total) by mouth daily. 90 tablet 1  . PROAIR HFA 108 (90 Base) MCG/ACT inhaler INL 2 PFS PO Q 4 TO 6 H PRN  5  . promethazine (PHENERGAN) 25 MG suppository Place 1 suppository (25 mg total) rectally every 6 (six) hours as needed for nausea or vomiting. 12 each 5  . STIOLTO RESPIMAT 2.5-2.5 MCG/ACT AERS INL 2 PFS PO QD  5  . Vitamin D, Ergocalciferol, (DRISDOL) 50000 units CAPS capsule TK ONE C PO Q WEEK  5   No current facility-administered medications for this visit.    Facility-Administered Medications Ordered in Other Visits  Medication Dose Route Frequency Provider Last Rate Last Dose  . heparin lock flush 100 unit/mL  500 Units Intravenous Once Lequita Asal, MD        Allergies:   Tylenol [acetaminophen]; Amitriptyline; Aspirin; Contrast media [iodinated diagnostic agents]; Nsaids; Ibuprofen;  and Tape    Social History:  The patient  reports that he has been smoking cigarettes.  He has been smoking about 0.50 packs per day. He has never used smokeless tobacco. He reports that he drinks alcohol. He reports that he does not use drugs.   Family History:  The patient's family history includes Breast cancer in his sister; Colon cancer in his paternal grandfather; Heart attack in his brother, brother, and sister; Heart attack (age of onset: 26) in his mother; Hyperlipidemia in his mother; Hypertension in his father; Lung cancer in his father.    ROS:  Please see the history of present illness.   Otherwise, review of systems are positive for none.   All other systems are reviewed and negative.    PHYSICAL EXAM: VS:  BP 112/83 (BP Location: Right Arm, Patient Position: Sitting, Cuff Size: Normal)   Pulse 94   Ht 5\' 11"  (1.803 m)   Wt 122 lb 8 oz (55.6 kg)   SpO2 96%   BMI 17.09 kg/m  , BMI Body mass index is 17.09 kg/m. GEN: Well nourished, well developed, in no acute  distress  HEENT: normal  Neck: no JVD, carotid bruits, or masses Cardiac: RRR; no murmurs, rubs, or gallops,no edema  Respiratory:  clear to auscultation bilaterally, normal work of breathing GI: soft, nontender, nondistended, + BS MS: no deformity or atrophy  Skin: warm and dry, no rash Neuro:  Strength and sensation are intact Psych: euthymic mood, full affect Vascular:  femoral pulses mildly diminished bilaterally.  Posterior tibial is +1 on the right and not palpable on the left.  Dorsalis pedis is not palpable.  EKG:  EKG is ordered today. The ekg ordered today demonstrates normal sinus rhythm with left atrial enlargement and possible old inferior infarct.   Recent Labs: 10/23/2016: TSH 0.946 06/21/2017: ALT 18; Hemoglobin 12.8; Platelets 182 06/28/2017: BUN 7; Creatinine, Ser 0.93; Magnesium 1.4; Potassium 3.3; Sodium 128    Lipid Panel    Component Value Date/Time   CHOL 182 07/27/2016 0602   TRIG 115 07/27/2016 0602   HDL 47 07/27/2016 0602   CHOLHDL 3.9 07/27/2016 0602   VLDL 23 07/27/2016 0602   LDLCALC 112 (H) 07/27/2016 0602      Wt Readings from Last 3 Encounters:  07/05/17 122 lb 8 oz (55.6 kg)  06/28/17 125 lb 1 oz (56.7 kg)  06/21/17 126 lb (57.2 kg)       No flowsheet data found.    ASSESSMENT AND PLAN:  1.  Severe peripheral neuropathy: Although the patient has peripheral arterial disease with claudication, the majority of his symptoms seem to be related to peripheral neuropathy.  I elected to add gabapentin for symptomatic relief.  2. Peripheral arterial disease: Status post left popliteal artery self-expanding stent placement in December 2016.   He does have bilateral calf claudication worse on the left side but as I said above, the majority of his symptoms seem to be related to peripheral neuropathy.  I also do not think he is a good candidate for angiography and endovascular intervention at the present time given metastatic lung cancer with  continued issues of weight loss and GI symptoms.  I am going to leave this as a last resort. He continues to smoke few cigarettes a day.  3.  Coronary artery disease involving native coronary arteries without angina: Continue medical therapy.   4. Hyperlipidemia: Continue treatment with atorvastatin.  5. Severe generalized pain: I agree that the patient needs  to be seen by the pain management.  I suspect that he should be on narcotic pain medication given his metastatic cancer.  6.  Weight loss and poor appetite: Consider treatment with Marinol.  Disposition:   FU with me in 2 months  Signed,  Kathlyn Sacramento, MD  07/05/2017 2:49 PM    [place]

## 2017-07-05 NOTE — Patient Instructions (Signed)
Medication Instructions:  Your physician has recommended you make the following change in your medication:  START taking gabapentin 300mg  twice daily for two days then INCREASE gabapentin to 300mg  three times a day   Labwork: none  Testing/Procedures: none  Follow-Up: Your physician recommends that you schedule a follow-up appointment in: 2 months with Dr. Fletcher Anon.    Any Other Special Instructions Will Be Listed Below (If Applicable).     If you need a refill on your cardiac medications before your next appointment, please call your pharmacy.

## 2017-07-14 ENCOUNTER — Other Ambulatory Visit: Payer: Self-pay | Admitting: Urgent Care

## 2017-07-29 ENCOUNTER — Ambulatory Visit (INDEPENDENT_AMBULATORY_CARE_PROVIDER_SITE_OTHER): Payer: Medicaid Other | Admitting: Gastroenterology

## 2017-07-29 ENCOUNTER — Encounter: Payer: Self-pay | Admitting: Gastroenterology

## 2017-07-29 ENCOUNTER — Encounter (INDEPENDENT_AMBULATORY_CARE_PROVIDER_SITE_OTHER): Payer: Self-pay

## 2017-07-29 VITALS — BP 84/64 | HR 93 | Ht 71.0 in | Wt 114.5 lb

## 2017-07-29 DIAGNOSIS — R112 Nausea with vomiting, unspecified: Secondary | ICD-10-CM

## 2017-07-29 DIAGNOSIS — R634 Abnormal weight loss: Secondary | ICD-10-CM

## 2017-07-29 NOTE — Progress Notes (Signed)
Gastroenterology Consultation  Referring Provider:     Lorelee Market, MD Primary Care Physician:  Lorelee Market, MD Primary Gastroenterologist:  Dr. Allen Norris     Reason for Consultation:     Nausea and vomiting        HPI:   Philip [name] is a 57 y.o. y/o male referred for consultation & management of nausea and vomiting by Dr. Lorelee Market, MD.  This patient comes here today with a history of lung cancer that has been categorized as extensive.  There appears to be metastases and lymph nodes involved with his disease.  The patient reports that he had symptoms for approximately 3 years before his diagnosis.  The patient is being followed by pain management also.  He has been reporting that he has nausea and vomiting with many things he eats.  There is no food in particular that makes him more nauseous or vomit.  The patient also reports that he can vomit with just drinking water.  He sometimes has supplemented his diet with Ensure and ice cream.  The patient had an upper GI series that did not show any abnormalities.  The patient is presently on Plavix for peripheral vascular disease and cardiac disease.  Past Medical History:  Diagnosis Date  . Anemia   . CAD (coronary artery disease)    a. 08/2014 Inf STEMI/CABG x 3 (LIMA->LAD, VG->Diag, RIMA->RCA).  . DVT, recurrent, lower extremity, acute, left ([place]) 2016  . GIB (gastrointestinal bleeding)    a. In the setting of DAPT-->tolerating plavix only.  . Hyperlipidemia   . Hypertensive heart disease   . Lung cancer ([place])    a. s/p chemo/radiation.  Marland Kitchen PAD (peripheral artery disease) ([place])    a. 03/2015 Periph Angio: short occlusion of L pop w/ evidence of embolization into the DP->5.0x50 mm Inova self-expanding stent;  b. 04/08/2015 ABI: R 1.12, L 0.99; c. 01/2017 LE Duplex: bilat SFA obstructive dzs, patent L pop.  . Tobacco abuse   . Toe amputation status, left [facility]) 07/09/2016   2017    Past Surgical History:    Procedure Laterality Date  . CARDIAC CATHETERIZATION N/A 08/24/2014   Procedure: Left Heart Cath and Coronary Angiography;  Surgeon: Isaias Cowman, MD;  Location: [place] CV LAB;  Service: Cardiovascular;  Laterality: N/A;  . CARDIAC CATHETERIZATION N/A 08/24/2014   Procedure: Coronary Stent Intervention;  Surgeon: Isaias Cowman, MD;  Location: [place] CV LAB;  Service: Cardiovascular;  Laterality: N/A;  . CORONARY ARTERY BYPASS GRAFT N/A 08/25/2014   Procedure: CORONARY ARTERY BYPASS GRAFTING (CABG), ON PUMP, TIMES THREE, USING BILATERAL MAMMARY ARTERIES, RIGHT GREATER SAPHENOUS VEIN HARVESTED ENDOSCOPICALLY;  Surgeon: Melrose Nakayama, MD;  Location: [place];  Service: Open Heart Surgery;  Laterality: N/A;  . INCISION AND DRAINAGE ABSCESS Left 11/15/2016   Procedure: INCISION AND DRAINAGE ABSCESS OF LEFT WRIST;  Surgeon: Roseanne Kaufman, MD;  Location: [place];  Service: Orthopedics;  Laterality: Left;  . PERIPHERAL VASCULAR CATHETERIZATION N/A 03/30/2015   Procedure: Abdominal Aortogram w/Lower Extremity;  Surgeon: Wellington Hampshire, MD;  Location: [place] CV LAB;  Service: Cardiovascular;  Laterality: N/A;  . PORTA CATH INSERTION N/A 07/27/2016   Procedure: Glori Luis Cath Insertion;  Surgeon: Algernon Huxley, MD;  Location: [place] CV LAB;  Service: Cardiovascular;  Laterality: N/A;  . TEE WITHOUT CARDIOVERSION N/A 08/25/2014   Procedure: TRANSESOPHAGEAL ECHOCARDIOGRAM (TEE);  Surgeon: Melrose Nakayama, MD;  Location: [place];  Service: Open Heart Surgery;  Laterality:  N/A;    Prior to Admission medications   Medication Sig Start Date End Date Taking? Authorizing Provider  atorvastatin (LIPITOR) 40 MG tablet TAKE 1 TABLET BY MOUTH DAILY 08/02/16   Wellington Hampshire, MD  benzonatate (TESSALON) 100 MG capsule TK 1 C PO TID PRN 03/16/17   [provider]  buPROPion (WELLBUTRIN XL) 150 MG 24 hr tablet TK 1 T PO QD 01/18/17   [provider]  Calcium  Carb-Cholecalciferol (CALCIUM + D3 PO) Take 1 tablet by mouth 3 (three) times daily.    [provider]  clopidogrel (PLAVIX) 75 MG tablet Take 1 tablet (75 mg total) by mouth daily. 11/14/16   Wellington Hampshire, MD  diphenhydrAMINE (BENADRYL) 25 mg capsule Take 25 mg by mouth 2 (two) times daily as needed for allergies.     [provider]  FLOVENT HFA 220 MCG/ACT inhaler INL 2 PFS PO BID 02/01/17   [provider]  fluconazole (DIFLUCAN) 100 MG tablet Take 1 tablet (100 mg total) daily by mouth. 02/27/17   Noreene Filbert, MD  furosemide (LASIX) 20 MG tablet Take 20 mg daily by mouth. 02/21/17   [provider]  gabapentin (NEURONTIN) 300 MG capsule Take 1 capsule (300 mg total) by mouth 3 (three) times daily. 07/05/17   Wellington Hampshire, MD  lidocaine-prilocaine (EMLA) cream Apply to affected area once Patient taking differently: Apply 1 application topically See admin instructions. TO AFFECTED AREA AS DIRECTED 07/31/16   Lequita Asal, MD  lisinopril (PRINIVIL,ZESTRIL) 5 MG tablet TAKE 1 TABLET(5 MG) BY MOUTH DAILY 06/19/17   Theora Gianotti, NP  magnesium oxide (MAG-OX) 400 (241.3 Mg) MG tablet Take 1 tablet (400 mg total) by mouth 3 (three) times daily. 05/16/17   Karen Kitchens, NP  metoprolol tartrate (LOPRESSOR) 25 MG tablet TAKE 1 TABLET BY MOUTH TWICE DAILY 12/05/16   Wellington Hampshire, MD  ondansetron (ZOFRAN) 8 MG tablet TAKE 1 TABLET BY MOUTH EVERY 8 HOURS AS NEEDED FOR NAUSEA OR VOMITING 07/14/17   Lequita Asal, MD  pantoprazole (PROTONIX) 40 MG tablet TK 1 T PO QD 01/18/17   [provider]  potassium chloride (K-DUR) 10 MEQ tablet Take 1 tablet (10 mEq total) by mouth daily. 05/16/17   Karen Kitchens, NP  PROAIR HFA 108 [phone] Base) MCG/ACT inhaler INL 2 PFS PO Q 4 TO 6 H PRN 02/01/17   [provider]  promethazine (PHENERGAN) 25 MG suppository Place 1 suppository (25 mg total) rectally every 6 (six) hours as needed for  nausea or vomiting. 04/11/17   Lequita Asal, MD  STIOLTO RESPIMAT 2.5-2.5 MCG/ACT AERS INL 2 PFS PO QD 02/04/17   [provider]  Vitamin D, Ergocalciferol, (DRISDOL) 50000 units CAPS capsule TK ONE C PO Q WEEK 03/11/17   [provider]    Family History  Problem Relation Age of Onset  . Hypertension Father   . Lung cancer Father   . Heart attack Mother 73       STENT  . Hyperlipidemia Mother   . Heart attack Brother   . Heart attack Brother   . Breast cancer Sister   . Heart attack Sister   . Colon cancer Paternal Grandfather      Social History   Tobacco Use  . Smoking status: Current Every Day Smoker    Packs/day: 0.50    Types: Cigarettes  . Smokeless tobacco: Never Used  . Tobacco comment: 4cigs day down  from 2ppd  Substance Use Topics  . Alcohol use: Yes    Alcohol/week: 0.0 oz    Comment: Sometimes on w/e.  . Drug use: No    Allergies as of 07/29/2017 - Review Complete 07/05/2017  Allergen Reaction Noted  . Tylenol [acetaminophen] Palpitations 05/16/2017  . Amitriptyline Other (See Comments) 03/21/2016  . Aspirin  06/21/2017  . Contrast media [iodinated diagnostic agents] Other (See Comments) 07/01/2014  . Nsaids Other (See Comments) 08/24/2014  . Ibuprofen Other (See Comments) and Nausea Only 07/06/2014  . Tape Rash and Other (See Comments) 11/15/2016    Review of Systems:    All systems reviewed and negative except where noted in HPI.   Physical Exam:  There were no vitals taken for this visit. No LMP for male patient. Psych:  Alert and cooperative. Normal mood and affect. General:   Alert,  Well-developed, well-nourished, pleasant and cooperative in NAD Head:  Normocephalic and atraumatic. Eyes:  Sclera clear, no icterus.   Conjunctiva pink. Ears:  Normal auditory acuity. Nose:  No deformity, discharge, or lesions. Mouth:  No deformity or lesions,oropharynx pink & moist. Neck:  Supple; no masses or thyromegaly. Lungs:   Respirations even and unlabored.  Clear throughout to auscultation.   No wheezes, crackles, or rhonchi. No acute distress. Heart:  Regular rate and rhythm; no murmurs, clicks, rubs, or gallops. Abdomen:  Normal bowel sounds.  No bruits.  Soft, non-tender and non-distended without masses, hepatosplenomegaly or hernias noted.  No guarding or rebound tenderness.  Negative Carnett sign.   Rectal:  Deferred.  Msk:  Symmetrical without gross deformities.  Good, equal movement & strength bilaterally. Pulses:  Normal pulses noted. Extremities:  No clubbing or edema.  No cyanosis. Neurologic:  Alert and oriented x3;  grossly normal neurologically. Skin:  Intact without significant lesions or rashes.  No jaundice. Lymph Nodes:  No significant cervical adenopathy. Psych:  Alert and cooperative. Normal mood and affect.  Imaging Studies: No results found.  Assessment and Plan:   Philip [name] is a 57 y.o. y/o male who comes in with nausea and vomiting and weight loss.  The patient may have these all related to his primary malignancy although a upper GI source should be ruled out.  The patient has been told that he should be set up for an EGD.  The patient will be set up for an EGD.  The patient has been told to try to keep up his caloric intake so that he does not lose any more weight.  The patient has been explained the plan and agrees with it.  Lucilla Lame, MD. [facility]   Note: This dictation was prepared with Dragon dictation along with smaller phrase technology. Any transcriptional errors that result from this process are unintentional.

## 2017-07-30 ENCOUNTER — Telehealth: Payer: Self-pay | Admitting: Gastroenterology

## 2017-07-30 ENCOUNTER — Other Ambulatory Visit: Payer: Self-pay

## 2017-07-30 NOTE — Telephone Encounter (Signed)
Patient returned your call to set up procedure. Please call

## 2017-07-31 ENCOUNTER — Other Ambulatory Visit: Payer: Self-pay

## 2017-07-31 DIAGNOSIS — R112 Nausea with vomiting, unspecified: Secondary | ICD-10-CM

## 2017-07-31 NOTE — Progress Notes (Signed)
Hold Plavix 7 days before procedure and resume after.

## 2017-07-31 NOTE — Telephone Encounter (Signed)
Pt scheduled for an EGD with Vicente Males on 08/08/17.

## 2017-08-02 ENCOUNTER — Other Ambulatory Visit: Payer: Self-pay | Admitting: Urgent Care

## 2017-08-02 ENCOUNTER — Inpatient Hospital Stay (HOSPITAL_BASED_OUTPATIENT_CLINIC_OR_DEPARTMENT_OTHER): Payer: Medicaid Other | Admitting: Urgent Care

## 2017-08-02 ENCOUNTER — Inpatient Hospital Stay: Payer: Medicaid Other | Attending: Hematology and Oncology

## 2017-08-02 ENCOUNTER — Inpatient Hospital Stay: Payer: Medicaid Other

## 2017-08-02 VITALS — BP 122/86 | HR 96 | Temp 96.9°F | Resp 18 | Wt 118.1 lb

## 2017-08-02 DIAGNOSIS — R531 Weakness: Secondary | ICD-10-CM | POA: Diagnosis not present

## 2017-08-02 DIAGNOSIS — R634 Abnormal weight loss: Secondary | ICD-10-CM

## 2017-08-02 DIAGNOSIS — I2510 Atherosclerotic heart disease of native coronary artery without angina pectoris: Secondary | ICD-10-CM | POA: Diagnosis not present

## 2017-08-02 DIAGNOSIS — C7951 Secondary malignant neoplasm of bone: Secondary | ICD-10-CM | POA: Insufficient documentation

## 2017-08-02 DIAGNOSIS — C3412 Malignant neoplasm of upper lobe, left bronchus or lung: Secondary | ICD-10-CM

## 2017-08-02 DIAGNOSIS — C3490 Malignant neoplasm of unspecified part of unspecified bronchus or lung: Secondary | ICD-10-CM

## 2017-08-02 DIAGNOSIS — Z923 Personal history of irradiation: Secondary | ICD-10-CM | POA: Diagnosis not present

## 2017-08-02 DIAGNOSIS — M545 Low back pain: Secondary | ICD-10-CM | POA: Diagnosis not present

## 2017-08-02 DIAGNOSIS — E785 Hyperlipidemia, unspecified: Secondary | ICD-10-CM | POA: Diagnosis not present

## 2017-08-02 DIAGNOSIS — D6481 Anemia due to antineoplastic chemotherapy: Secondary | ICD-10-CM | POA: Insufficient documentation

## 2017-08-02 DIAGNOSIS — E871 Hypo-osmolality and hyponatremia: Secondary | ICD-10-CM

## 2017-08-02 DIAGNOSIS — R630 Anorexia: Secondary | ICD-10-CM | POA: Diagnosis not present

## 2017-08-02 DIAGNOSIS — F17210 Nicotine dependence, cigarettes, uncomplicated: Secondary | ICD-10-CM | POA: Insufficient documentation

## 2017-08-02 DIAGNOSIS — I739 Peripheral vascular disease, unspecified: Secondary | ICD-10-CM | POA: Diagnosis not present

## 2017-08-02 DIAGNOSIS — R112 Nausea with vomiting, unspecified: Secondary | ICD-10-CM | POA: Insufficient documentation

## 2017-08-02 DIAGNOSIS — G894 Chronic pain syndrome: Secondary | ICD-10-CM

## 2017-08-02 DIAGNOSIS — E876 Hypokalemia: Secondary | ICD-10-CM | POA: Diagnosis not present

## 2017-08-02 DIAGNOSIS — I119 Hypertensive heart disease without heart failure: Secondary | ICD-10-CM | POA: Diagnosis not present

## 2017-08-02 DIAGNOSIS — Z803 Family history of malignant neoplasm of breast: Secondary | ICD-10-CM | POA: Insufficient documentation

## 2017-08-02 DIAGNOSIS — Z801 Family history of malignant neoplasm of trachea, bronchus and lung: Secondary | ICD-10-CM | POA: Insufficient documentation

## 2017-08-02 DIAGNOSIS — G629 Polyneuropathy, unspecified: Secondary | ICD-10-CM | POA: Insufficient documentation

## 2017-08-02 DIAGNOSIS — Z79899 Other long term (current) drug therapy: Secondary | ICD-10-CM | POA: Diagnosis not present

## 2017-08-02 DIAGNOSIS — C771 Secondary and unspecified malignant neoplasm of intrathoracic lymph nodes: Secondary | ICD-10-CM | POA: Insufficient documentation

## 2017-08-02 DIAGNOSIS — R1115 Cyclical vomiting syndrome unrelated to migraine: Secondary | ICD-10-CM

## 2017-08-02 DIAGNOSIS — Z800 Family history of malignant neoplasm of digestive organs: Secondary | ICD-10-CM | POA: Insufficient documentation

## 2017-08-02 DIAGNOSIS — G8929 Other chronic pain: Secondary | ICD-10-CM | POA: Insufficient documentation

## 2017-08-02 DIAGNOSIS — R5383 Other fatigue: Secondary | ICD-10-CM | POA: Diagnosis not present

## 2017-08-02 DIAGNOSIS — Z9221 Personal history of antineoplastic chemotherapy: Secondary | ICD-10-CM | POA: Diagnosis not present

## 2017-08-02 LAB — COMPREHENSIVE METABOLIC PANEL
ALBUMIN: 2.7 g/dL — AB (ref 3.5–5.0)
ALT: 22 U/L (ref 17–63)
ANION GAP: 11 (ref 5–15)
AST: 28 U/L (ref 15–41)
Alkaline Phosphatase: 78 U/L (ref 38–126)
BILIRUBIN TOTAL: 0.6 mg/dL (ref 0.3–1.2)
BUN: 6 mg/dL (ref 6–20)
CO2: 27 mmol/L (ref 22–32)
Calcium: 8.6 mg/dL — ABNORMAL LOW (ref 8.9–10.3)
Chloride: 86 mmol/L — ABNORMAL LOW (ref 101–111)
Creatinine, Ser: 0.87 mg/dL (ref 0.61–1.24)
GFR calc non Af Amer: >60 mL/min (ref >60)
GLUCOSE: 108 mg/dL — AB (ref 65–99)
POTASSIUM: 2.8 mmol/L — AB (ref 3.5–5.1)
Sodium: 124 mmol/L — ABNORMAL LOW (ref 135–145)
TOTAL PROTEIN: 5.7 g/dL — AB (ref 6.5–8.1)

## 2017-08-02 LAB — CBC WITH DIFFERENTIAL/PLATELET
BASOS ABS: 0 10*3/uL (ref 0–0.1)
BASOS PCT: 1 %
Eosinophils Absolute: 0 10*3/uL (ref 0–0.7)
Eosinophils Relative: 1 %
HEMATOCRIT: 33.4 % — AB (ref 40.0–52.0)
HEMOGLOBIN: 11.9 g/dL — AB (ref 13.0–18.0)
Lymphocytes Relative: 29 %
Lymphs Abs: 1 10*3/uL (ref 1.0–3.6)
MCH: 35.2 pg — ABNORMAL HIGH (ref 26.0–34.0)
MCHC: 35.7 g/dL (ref 32.0–36.0)
MCV: 98.4 fL (ref 80.0–100.0)
MONOS PCT: 10 %
Monocytes Absolute: 0.3 10*3/uL (ref 0.2–1.0)
NEUTROS ABS: 2.1 10*3/uL (ref 1.4–6.5)
NEUTROS PCT: 59 %
Platelets: 194 10*3/uL (ref 150–440)
RBC: 3.4 MIL/uL — AB (ref 4.40–5.90)
RDW: 14.9 % — ABNORMAL HIGH (ref 11.5–14.5)
WBC: 3.6 10*3/uL — AB (ref 3.8–10.6)

## 2017-08-02 LAB — MAGNESIUM: MAGNESIUM: 1.2 mg/dL — AB (ref 1.7–2.4)

## 2017-08-02 MED ORDER — HEPARIN SOD (PORK) LOCK FLUSH 100 UNIT/ML IV SOLN
250.0000 [IU] | Freq: Once | INTRAVENOUS | Status: AC | PRN
  Administered 2017-08-02: 500 [IU]

## 2017-08-02 MED ORDER — SODIUM CHLORIDE 0.9 % IV SOLN
Freq: Once | INTRAVENOUS | Status: AC
  Administered 2017-08-02: 11:00:00 via INTRAVENOUS
  Filled 2017-08-02: qty 1000

## 2017-08-02 MED ORDER — TEMAZEPAM 7.5 MG PO CAPS: 7.5000 mg | ORAL_CAPSULE | Freq: Every evening | ORAL | 0 refills | Status: DC | PRN

## 2017-08-02 MED ORDER — TRAMADOL HCL 50 MG PO TABS: 50.0000 mg | ORAL_TABLET | Freq: Four times a day (QID) | ORAL | 0 refills | Status: DC | PRN

## 2017-08-02 MED ORDER — SODIUM CHLORIDE 0.9% FLUSH
10.0000 mL | INTRAVENOUS | Status: DC | PRN
  Filled 2017-08-02: qty 10

## 2017-08-02 MED ORDER — POTASSIUM CHLORIDE CRYS ER 20 MEQ PO TBCR: 20.0000 meq | EXTENDED_RELEASE_TABLET | Freq: Every day | ORAL | 0 refills | Status: DC

## 2017-08-02 MED ORDER — MIRTAZAPINE 7.5 MG PO TABS: 7.5000 mg | ORAL_TABLET | Freq: Every day | ORAL | 0 refills | Status: DC

## 2017-08-02 MED ORDER — ONDANSETRON 8 MG PO TBDP: 8.0000 mg | ORAL_TABLET | Freq: Three times a day (TID) | ORAL | 1 refills | Status: DC | PRN

## 2017-08-02 MED ORDER — SODIUM CHLORIDE 0.9 % IV SOLN
Freq: Once | INTRAVENOUS | Status: AC
  Administered 2017-08-02: 10:00:00 via INTRAVENOUS
  Filled 2017-08-02: qty 1000

## 2017-08-02 NOTE — Progress Notes (Signed)
[facility] day:  08/02/17   Chief Complaint: Philip [name] is a 57 y.o. male with extensive stage small cell lung cancer who is seen for 1 month assessment.  HPI:  The patient was last seen in the medical oncology clinic on 06/28/2017.  At that time, patient felt "weak and tired all the time".  He complained of difficulties with ambulation.  He noted chronic back pain.  He also had chronic pain in his bilateral lower extremities due to a known vascular issue.  Patient was scheduled to follow-up with cardiology for further assessment.  Patient continued to lose weight; weight down 2 pounds.  He complained of a sensation of esophageal impaction when eating, which caused him to be nauseated.  He was scheduled to see GI consult for further evaluation.  He was again referred to pain management.  Exam was stable. Magnesium low at 1.4 sodium low at 128.Marland Kitchen  Potassium low at 3.3. Calcium low at 8.1.  He received IV fluids, magnesium, and potassium.  Patient was seen in consult on 07/05/2017 by Dr. Marlyne Beards (cardiology) for evaluation of lower extremity pain.  Patient with notable peripheral artery disease with claudication.  Dr. Fletcher Anon noted that the patient was not a good candidate for angiography and endovascular intervention at this time.  Patient states, "he told me that he didn't want to do it because my legs were so skinny". Patient also noted to have severe peripheral neuropathy, for which Dr. Fletcher Anon prescribed  gabapentin. Patient states, "I used to take that stuff. It has never worked for me.   Patient was seen in consult on 07/29/2017 by Dr. Lucilla Lame (gastroenterology).  GI felt that the patient's nausea, vomiting, and weight loss may potentially be related to his primary malignancy, however an upper GI source could not be ruled out. Patient was scheduled for an EGD on 08/08/2017.   In the interim, patient continues to report weakness and fatigue.  Patient continues to have nausea despite prescribed antiemetics. Patient currently taking ondansetron PO and promethazine PR. Patient is denying dysphagia, however notes that whatever he eats comes right back up. Patient is trying to incorporate Carnation instant breakfast into his diet. Weight up 4 pounds since his last visit to the clinic, however down a total over 26 pounds overall in the last 6 months. Patient rates his appetite as poor. He has never tried an appetite stimulant. Patient continues on the prescribed oral magnesium and potassium supplements at home.   Patient complains of pain in his bilateral lower extremities and in his lower back. Pain is chronic in nature, and rated 7/10 in clinic today. At the present, patient is not taking pain medications. [place] narcotic registry reviewed. Last narcotic prescription was filled on 06/21/2017 for Roxicodone 5 mg #12 tabs as prescribed by the [facility] ED. Prior to that, the patient has not received any narcotics since December 2018. Patient has been referred several times to pain management, however to date, he has not been seen in consult. Patient states, "I had it out with these people over here at this pain clinic when my son was sick".   Symptomatically, patient denies any B symptoms. He denies increased shortness of breath. Patient has not appreciated any new areas of adenopathy. Patient denies bleeding; no hematochezia, melena, or gross hematuria. Patient has not experienced any recent infections. Patient continues to smoke on a daily basis.    Past Medical History:  Diagnosis Date  .  Anemia   . CAD (coronary artery disease)    a. 08/2014 Inf STEMI/CABG x 3 (LIMA->LAD, VG->Diag, RIMA->RCA).  . DVT, recurrent, lower extremity, acute, left ([place]) 2016  . GIB (gastrointestinal bleeding)    a. In the setting of DAPT-->tolerating plavix only.  . Hyperlipidemia   . Hypertensive heart disease   . Lung cancer ([place])    a. s/p chemo/radiation.  Marland Kitchen PAD  (peripheral artery disease) ([place])    a. 03/2015 Periph Angio: short occlusion of L pop w/ evidence of embolization into the DP->5.0x50 mm Inova self-expanding stent;  b. 04/08/2015 ABI: R 1.12, L 0.99; c. 01/2017 LE Duplex: bilat SFA obstructive dzs, patent L pop.  . Tobacco abuse   . Toe amputation status, left [facility]) 07/09/2016   2017    Past Surgical History:  Procedure Laterality Date  . CARDIAC CATHETERIZATION N/A 08/24/2014   Procedure: Left Heart Cath and Coronary Angiography;  Surgeon: Isaias Cowman, MD;  Location: [place] [facility];  Service: Cardiovascular;  Laterality: N/A;  . CARDIAC CATHETERIZATION N/A 08/24/2014   Procedure: Coronary Stent Intervention;  Surgeon: Isaias Cowman, MD;  Location: [place] [facility];  Service: Cardiovascular;  Laterality: N/A;  . CORONARY ARTERY BYPASS GRAFT N/A 08/25/2014   Procedure: CORONARY ARTERY BYPASS GRAFTING (CABG), ON PUMP, TIMES THREE, USING BILATERAL MAMMARY ARTERIES, RIGHT GREATER SAPHENOUS VEIN HARVESTED ENDOSCOPICALLY;  Surgeon: Melrose Nakayama, MD;  Location: [place];  Service: Open Heart Surgery;  Laterality: N/A;  . INCISION AND DRAINAGE ABSCESS Left 11/15/2016   Procedure: INCISION AND DRAINAGE ABSCESS OF LEFT WRIST;  Surgeon: Roseanne Kaufman, MD;  Location: [place];  Service: Orthopedics;  Laterality: Left;  . PERIPHERAL VASCULAR CATHETERIZATION N/A 03/30/2015   Procedure: Abdominal Aortogram w/Lower Extremity;  Surgeon: Wellington Hampshire, MD;  Location: [place] [facility];  Service: Cardiovascular;  Laterality: N/A;  . PORTA CATH INSERTION N/A 07/27/2016   Procedure: Glori Luis Cath Insertion;  Surgeon: Algernon Huxley, MD;  Location: [place] [facility];  Service: Cardiovascular;  Laterality: N/A;  . TEE WITHOUT CARDIOVERSION N/A 08/25/2014   Procedure: TRANSESOPHAGEAL ECHOCARDIOGRAM (TEE);  Surgeon: Melrose Nakayama, MD;  Location: [place];  Service: Open Heart Surgery;  Laterality: N/A;    Family History  Problem Relation  Age of Onset  . Hypertension Father   . Lung cancer Father   . Heart attack Mother 103       STENT  . Hyperlipidemia Mother   . Heart attack Brother   . Heart attack Brother   . Breast cancer Sister   . Heart attack Sister   . Colon cancer Paternal Grandfather     Social History:  reports that he has been smoking cigarettes.  He has been smoking about 0.50 packs per day. He has never used smokeless tobacco. He reports that he drinks alcohol. He reports that he does not use drugs.  The patient smokes 1/2 pack of cigarettes/day (previously 2 1/2 ppd).  He started smoking in his teens.  He denies any exposure to radiation or toxins.  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  He lives in [place].  His wife is named Publishing copy.  He is accompanied by his wife, son Philip [name]), and daughter Philip [name]) today.  Allergies:  Allergies  Allergen Reactions  . Tylenol [Acetaminophen] Palpitations  . Amitriptyline Other (See Comments)    Burning sensation all over  . Aspirin   . Contrast Media [Iodinated Diagnostic Agents] Other (See Comments)  Pt states that he got "knots behind his ears"  . Nsaids Other (See Comments)    Blood in stools  . Ibuprofen Other (See Comments) and Nausea Only    Blood in stools  . Tape Rash and Other (See Comments)    PLEASE USE COBAN WRAP; THE PATIENT'S SKIN TEARS WHEN BANDAGES ARE REMOVED!!    Current Medications: Current Outpatient Medications  Medication Sig Dispense Refill  . atorvastatin (LIPITOR) 40 MG tablet TAKE 1 TABLET BY MOUTH DAILY 90 tablet 3  . benzonatate (TESSALON) 100 MG capsule TK 1 C PO TID PRN  2  . buPROPion (WELLBUTRIN XL) 150 MG 24 hr tablet TK 1 T PO QD  5  . Calcium Carb-Cholecalciferol (CALCIUM + D3 PO) Take 1 tablet by mouth 3 (three) times daily.    . clopidogrel (PLAVIX) 75 MG tablet Take 1 tablet (75 mg total) by mouth daily. 90 tablet 3  . diphenhydrAMINE (BENADRYL) 25 mg capsule Take 25 mg by mouth 2 (two) times daily as  needed for allergies.     Marland Kitchen FLOVENT HFA 220 MCG/ACT inhaler INL 2 PFS PO BID  5  . furosemide (LASIX) 20 MG tablet Take 20 mg daily by mouth.  1  . gabapentin (NEURONTIN) 300 MG capsule Take 1 capsule (300 mg total) by mouth 3 (three) times daily. 90 capsule 3  . lidocaine-prilocaine (EMLA) cream Apply to affected area once (Patient taking differently: Apply 1 application topically See admin instructions. TO AFFECTED AREA AS DIRECTED) 30 g 3  . lisinopril (PRINIVIL,ZESTRIL) 5 MG tablet TAKE 1 TABLET(5 MG) BY MOUTH DAILY 90 tablet 3  . magnesium oxide (MAG-OX) 400 (241.3 Mg) MG tablet Take 1 tablet (400 mg total) by mouth 3 (three) times daily. 90 tablet 3  . metoprolol tartrate (LOPRESSOR) 25 MG tablet TAKE 1 TABLET BY MOUTH TWICE DAILY 180 tablet 3  . pantoprazole (PROTONIX) 40 MG tablet TK 1 T PO QD  5  . PROAIR HFA 108 (90 Base) MCG/ACT inhaler INL 2 PFS PO Q 4 TO 6 H PRN  5  . promethazine (PHENERGAN) 25 MG suppository Place 1 suppository (25 mg total) rectally every 6 (six) hours as needed for nausea or vomiting. 12 each 5  . STIOLTO RESPIMAT 2.5-2.5 MCG/ACT AERS INL 2 PFS PO QD  5  . Vitamin D, Ergocalciferol, (DRISDOL) 50000 units CAPS capsule TK ONE C PO Q WEEK  5  . fluconazole (DIFLUCAN) 100 MG tablet Take 1 tablet (100 mg total) daily by mouth. (Patient not taking: Reported on 07/29/2017) 7 tablet 3  . ondansetron (ZOFRAN-ODT) 8 MG disintegrating tablet Take 1 tablet (8 mg total) by mouth every 8 (eight) hours as needed for nausea or vomiting. 30 tablet 1  . potassium chloride SA (K-DUR,KLOR-CON) 20 MEQ tablet Take 1 tablet (20 mEq total) by mouth daily. 90 tablet 0  . temazepam (RESTORIL) 7.5 MG capsule Take 1 capsule (7.5 mg total) by mouth at bedtime as needed for sleep. 30 capsule 0  . traMADol (ULTRAM) 50 MG tablet Take 1 tablet (50 mg total) by mouth every 6 (six) hours as needed. 40 tablet 0   No current facility-administered medications for this visit.    Facility-Administered  Medications Ordered in Other Visits  Medication Dose Route Frequency Provider Last Rate Last Dose  . heparin lock flush 100 unit/mL  500 Units Intravenous Once Corcoran, Melissa C, MD      . heparin lock flush 100 unit/mL  250 Units Intracatheter Once PRN  Nolon Stalls C, MD      . sodium chloride 0.9 % 1,000 mL with potassium chloride 40 mEq, magnesium sulfate 4 g infusion   Intravenous Once Karen Kitchens, NP 250 mL/hr at 08/02/17 1055    . sodium chloride flush (NS) 0.9 % injection 10 mL  10 mL Intracatheter PRN Lequita Asal, MD        Review of Systems:  GENERAL:  Feels "weak and fatigued". No fevers or sweats.  Weight up 4 pounds since last visit; down 26 pounds overall in the past 6 months.   PERFORMANCE STATUS (ECOG):  2 HEENT:  No visual changes, runny nose, sore throat, mouth sores or tenderness. Lungs: No shortness of breath or cough.  No hemoptysis. Cardiac:  No chest pain, palpitations, orthopnea, or PND. GI:  Nausea and vomiting continue after eating. Poor appetite as food bolus becomes "stuck" in distal esophagus. No diarrhea, constipation, melena or hematochezia.   GU:  No urgency, frequency, dysuria, or hematuria. Musculoskeletal:  Low back pain (chronic). No joint pain.  No muscle tenderness. Extremities:  Lower extremity pain secondary to peripheral vascular disease.  No swelling. Skin:  Left facial abrasion; improved.  No rashes or skin changes. Neuro:  Peripheral neuropathy; on gabapentin. Generalized weakness. No headache, numbness, balance or coordination issues. Endocrine:  No diabetes, thyroid issues, hot flashes or night sweats. Psych:  No mood changes, depression or anxiety. Pain: 7/10 - chronic back pain and pain in lower extremities.   Review of systems:  All other systems reviewed and found to be negative.  Physical Exam: Blood pressure 122/86, pulse 96, temperature (!) 96.9 F (36.1 C), temperature source Tympanic, resp. rate 18, weight 118 lb 1 oz  (53.6 kg), SpO2 100 %. GENERAL:  Thin gentleman sitting comfortably in the exam room in no acute distress. MENTAL STATUS: Alert and oriented to person, place and time. HEAD:Wearing a cap.  Normocephalic, atraumatic, face symmetric. Cushingoid features. EYES:Browneyes. Pupils equal round and reactive to light and accomodation. No conjunctivitis or scleral icterus. NGE:[id number] clear without lesion. Tonguenormal. Poor dentition. Mucous membranes moist. RESPIRATORY:Wheezing. Dry rales. No rhonchi.  CARDIOVASCULAR:Regular rate andrhythmwithout murmur, rub or gallop. ABDOMEN:Soft, non-tender, with active bowel sounds, and no hepatosplenomegaly. No masses. SKIN: No rashes or areas of compromised skin integrity.  EXTREMITIES: Chronic claudication pain in BLE secondary to PVD. No lower extremity edema, no skin discoloration or tenderness. No palpable cords.  LYMPHNODES: No palpable cervical, supraclavicular, axillary or inguinal adenopathy  NEUROLOGICAL: Alert & oriented, cranial nerves II-XII intact; motor strength 5/5 throughout; sensation intact; finger to nose and RAM normal. PSYCH: Appropriate.   Infusion on 08/02/2017  Component Date Value Ref Range Status  . Sodium 08/02/2017 124* 135 - 145 mmol/L Final  . Potassium 08/02/2017 2.8* 3.5 - 5.1 mmol/L Final  . Chloride 08/02/2017 86* 101 - 111 mmol/L Final  . CO2 08/02/2017 27  22 - 32 mmol/L Final  . Glucose, Bld 08/02/2017 108* 65 - 99 mg/dL Final  . BUN 08/02/2017 6  6 - 20 mg/dL Final  . Creatinine, Ser 08/02/2017 0.87  0.61 - 1.24 mg/dL Final  . Calcium 08/02/2017 8.6* 8.9 - 10.3 mg/dL Final  . Total Protein 08/02/2017 5.7* 6.5 - 8.1 g/dL Final  . Albumin 08/02/2017 2.7* 3.5 - 5.0 g/dL Final  . AST 08/02/2017 28  15 - 41 U/L Final  . ALT 08/02/2017 22  17 - 63 U/L Final  . Alkaline Phosphatase 08/02/2017 78  38 - 126 U/L Final  .  Total Bilirubin 08/02/2017 0.6  0.3 - 1.2 mg/dL Final  . GFR calc non Af  Amer 08/02/2017 >60  >60 mL/min Final  . GFR calc Af Amer 08/02/2017 >60  >60 mL/min Final   Comment: (NOTE) The eGFR has been calculated using the CKD EPI equation. This calculation has not been validated in all clinical situations. eGFR's persistently <60 mL/min signify possible Chronic Kidney Disease.   Georgiann Hahn gap 08/02/2017 11  5 - 15 Final   Performed at [facility], [address] [place], [place] [address]  . Magnesium 08/02/2017 1.2* 1.7 - 2.4 mg/dL Final   Performed at [facility], [address], [place], [place] [address]  . WBC 08/02/2017 3.6* 3.8 - 10.6 K/uL Final  . RBC 08/02/2017 3.40* 4.40 - 5.90 MIL/uL Final  . Hemoglobin 08/02/2017 11.9* 13.0 - 18.0 g/dL Final  . HCT 08/02/2017 33.4* 40.0 - 52.0 % Final  . MCV 08/02/2017 98.4  80.0 - 100.0 fL Final  . MCH 08/02/2017 35.2* 26.0 - 34.0 pg Final  . MCHC 08/02/2017 35.7  32.0 - 36.0 g/dL Final  . RDW 08/02/2017 14.9* 11.5 - 14.5 % Final  . Platelets 08/02/2017 194  150 - 440 K/uL Final  . Neutrophils Relative % 08/02/2017 59  % Final  . Neutro Abs 08/02/2017 2.1  1.4 - 6.5 K/uL Final  . Lymphocytes Relative 08/02/2017 29  % Final  . Lymphs Abs 08/02/2017 1.0  1.0 - 3.6 K/uL Final  . Monocytes Relative 08/02/2017 10  % Final  . Monocytes Absolute 08/02/2017 0.3  0.2 - 1.0 K/uL Final  . Eosinophils Relative 08/02/2017 1  % Final  . Eosinophils Absolute 08/02/2017 0.0  0 - 0.7 K/uL Final  . Basophils Relative 08/02/2017 1  % Final  . Basophils Absolute 08/02/2017 0.0  0 - 0.1 K/uL Final   Performed at [facility], [address], [place], [place] [address]    Assessment:  HARLIE RAGLE is a 57 y.o. male with extensive stage small cell lung cancer.  He presented with a 2 year history of night sweats and a 36 pound weight loss in 6 months.  Right supraclavicular biopsy on 07/19/2016 revealed small cell carcinoma of the lung.  CEA was 1.7 on 07/31/2016.  Chest CT on 06/28/2016 revealed  extensive adenopathy.  There were multiple lymph nodes descending thoracic aorta, largest measuring 2.1 x 1.7 cm.  There was adenopathy in the aortopulmonary window with the largest confluence of lymph nodes measuring 4 x 2.6 cm. There was hilar adenopathy on the left measuring 2 x 1.9 cm. There were multiple enlarged lymph nodes in the pretracheal region, largest 1.7 x 1.7 cm.  There were lymph nodes to the left of the carina measuring 2.8 x 1.9 cm. There was a subcarinal lymph node measuring 2.4 x 1.6 cm. There was a 4 x 3 mm nodular opacity in the anterior segment of the left upper lobe.    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  There was nodal metastasis within the chest and supraclavicular/low jugular regions.  There was multifocal osseous metastasis.  There was possibly a pathologic fracture involving the posteromedial right tenth rib.  There was new left lower lobe opacity with mild hypermetabolism, suspicious for infection or postobstructive pneumonitis.  He completed a course of Levaquin for apost-obstructive pneumonia.  He experienced transient positional left facial burning.  Head MRI on 07/26/2016 revealed no evidence of brain metastasis. There  was punctate DWI hyperintensity along a high right frontal gyrus, possible recent infarct.  There was a small occipital bone metastasis.  Head MRA on 07/27/2016 revealed normal large and medium sized vessels.  Bilateral carotid duplex on 07/27/2016 revealed <50% stenosis in the right and left internal carotis arteries.  Echo on 07/27/2016 revealed an EF of 45-50% without cardiac source of emboli.  He received 4 cycles of carboplatin and etoposide (07/31/2016 - 10/02/2016) with OnPro Neulasta support.  Platelet nadir was 40,000 on day 16 of cycle #2 and 23,000 on day 15 of cycle #3.  He received 2 units of PRBC with cycle #3 and 1 unit with cycle #4.  He received Xgeva on 08/13/2016 (last  10/30/2016).  Chest CT on 09/11/2016 revealed a marked response to therapy.  There was near complete resolution of left suprahilar mass and thoracic adenopathy.  There was interval sclerosis indicative of healing site of osseous metastasis.  Thoracic spine MRI on 10/22/2016 reveals sclerotic enhancing lesion at T7 vertebral body on the left compatible with metastasis. There was a healing fracture the right medial 10th rib.  PET scan on 11/12/2016 revealed a new 10 mm hypermetabolic left axillary lymph node (SUV 3.6) due to a left hand abscess.  There were no other sites of metabolically active disease.  He completed a course of thoracic spine radiation (3000 cGy in 10 fractions) on 01/11/2017.  He received PCI (3000 cGy in 15 fractions) from 01/28/2017 - 02/15/2017.  Chest, abdomen, and pelvic CT on 03/18/2017 revealed new areas of irregular ground-glass in the medial aspects of the right upper and right lower lobes as well as somewhat amorphous ground-glass in the lingula and left lower lobe. Time interval favored infectious or inflammatory lesions.  Osseous metastatic disease was stable.  There was resolved or resected left axillary lymph node.  Bone scan on 04/10/2017 revealed multiple foci of skeletal metastasis within the ribs, pelvis, and RIGHT clavicle.  Multiple areas of activity corresponding to sclerotic lesions on CT and non-hypermetabolic lesions on PET scan.  PET scan on 04/26/2017 revealed bandlike densities medially in the right upper lobe and medially in the right lower lobe were new from 11/12/2016 but were present on 03/18/2017, and had faint hypermetabolic activity. Radiation pneumonitis could have this appearance.  The morphology would be unusual for malignancy but surveillance was warranted.  There were scattered stable sclerotic bony lesions that were not hypermetabolic.  There was persistent accentuated activity in the vicinity of the anus may be physiologic but was technically  nonspecific.  He has chemotherapy induced anemia.  Anemia work-up on 09/11/2016 revealed the following normal labs:  Ferritin (219), iron sat (28%), TIBC (227), B12 (3055), and folate (6.8).  He has a history of left lower extremity DVT a few months after his cardiac surgery in 08/2014.  He has peripheral vascular disease s/p stent placement in 03/2015.  He is on a Plavix.  He has not had a colonoscopy in 30 years.  He was ineligible for the AbbVie clinical trial (M16-298) secondary to his left hand abscess.  He underwent surgical debridement of a left hand abscess on 11/15/2016.  Wound cultures grew MRSA.  He was treated with emperic IV Vancomycin then Bactrim.   He has chronic nausea.  UGI on 06/14/2017 was normal.   Symptomatically, patient continues to be "weak and tired". He has ongoing nausea and vomiting that is maintain associated with eating. Patient's weight is up 4 pounds since his last visit, however noted to be  down 26 pounds overall when trended over the last 6 months.  Food boluses become "stuck" in his distal esophagus, which causes him to vomit. Negative UGI. Has been referred to, and seen by, GI. Plans are for EGD on 08/08/2017. Continues to experience chronic pain in his legs and back. Exam is stable. Renal function stable (CrCl 71 mL/min). Magnesium low at 1.2. Sodium low at 124.  Potassium low at 2.8.  Calcium low at 8.6 (corrects to 9.6 when corrected for an albumin of 2.7).   Plan: 1.  Labs today: CBC with differential, CMP, magnesium 2.  Review cardiology consult.  While there is PVD, patient's symptoms thought to be associated with peripheral neuropathy.  Patient is not a candidate for angiography and endovascular intervention at this time.  Cardiology electing to symptomatically treat neuropathy, and reserve endovascular interventions as a last resort. 3.  Review GI consult. Patient is scheduled for an EGD on 08/08/2017 with Dr. Allen Norris. 4.  Discuss HYPOcalcemia. Calcium 8.6,  which corrects to 9.3 (albumin 2.7). Patient to continue calcium supplement at home. 5.  Discuss low magnesium of 1.2.  Will replace with 4 grams IV magnesium today.  Patient to continue Mag-Ox 400 mg 3 times daily at home. 6.  Discuss low potassium 2.8.  Will replace with 20mq of IV potassium today.  Patient to increase K-dur to 20 mEq daily at home. 7.  Discuss poor oral fluid intake. Sodium low at 124. Patient feels weak and tired.  Will give 1 L of 0.9% NS today. 8.  Discuss pain management. Patient notes that he has not followed up with pain clinic as advised due to previous issues with the clinic that he was referred to in the past. New referral sent to Dr. FNathanial Rancher Will prescribe short term course of Tramadol 50 mg q6 PRN (Disp #40). Patient notes that he has this medication at home, and is asking for something different. [place] narcotic registry reviewed. No tramadol fill noted on registry in the past 3 years. Encouraged patient to try this medication and follow up with pain management.   9.  Discuss nausea. Patient is using promethazine and ondansetron. Nausea persistent at time. Ondansetron works the best. Will try formulation change. New Rx sent for: Ondansetron 8 mg ODT to be used TID PRN.  10. Discuss nutrition and weight loss.  Patient encouraged to supplement diet with Ensure/Boost  shakes 2-3 times a day. Discussed appetite stimulation. Given patient's PVD, Megace will be avoided. Marinol considered, however patient has a history of THC use, which has not helped him. Will try low dose mirtazapine 7.5 mg daily.  11. RTC in 1 month for MD assessment, review of GI consult/EGD, labs (CBC with diff, BMP, Mg), and +/- IV Mg.   BHonor Loh NP  08/02/17, 12:39 PM

## 2017-08-02 NOTE — Progress Notes (Signed)
Patient states he has no appetite.  He is drinking Ensure.  States when he tries to swallow food it comes right back up.  He states he saw Dr. Allen Norris on Monday and is scheduled to have endoscopy next Thursday. Continues to complain about back pain that radiates into his legs.

## 2017-08-08 ENCOUNTER — Encounter
Admission: RE | Disposition: A | Discharge status: Home / Self Care | Payer: Self-pay | Source: Ambulatory Visit | Attending: Gastroenterology

## 2017-08-08 ENCOUNTER — Ambulatory Visit: Payer: Medicaid Other | Admitting: Anesthesiology

## 2017-08-08 ENCOUNTER — Ambulatory Visit
Admission: RE | Admit: 2017-08-08 | Discharge: 2017-08-08 | Disposition: A | Discharge status: Home / Self Care | Payer: Medicaid Other | Source: Ambulatory Visit | Attending: Gastroenterology | Admitting: Gastroenterology

## 2017-08-08 DIAGNOSIS — Z85118 Personal history of other malignant neoplasm of bronchus and lung: Secondary | ICD-10-CM | POA: Diagnosis not present

## 2017-08-08 DIAGNOSIS — I119 Hypertensive heart disease without heart failure: Secondary | ICD-10-CM | POA: Diagnosis not present

## 2017-08-08 DIAGNOSIS — Z7902 Long term (current) use of antithrombotics/antiplatelets: Secondary | ICD-10-CM | POA: Diagnosis not present

## 2017-08-08 DIAGNOSIS — F17210 Nicotine dependence, cigarettes, uncomplicated: Secondary | ICD-10-CM | POA: Insufficient documentation

## 2017-08-08 DIAGNOSIS — Z8249 Family history of ischemic heart disease and other diseases of the circulatory system: Secondary | ICD-10-CM | POA: Diagnosis not present

## 2017-08-08 DIAGNOSIS — E785 Hyperlipidemia, unspecified: Secondary | ICD-10-CM | POA: Diagnosis not present

## 2017-08-08 DIAGNOSIS — Z86718 Personal history of other venous thrombosis and embolism: Secondary | ICD-10-CM | POA: Diagnosis not present

## 2017-08-08 DIAGNOSIS — Z888 Allergy status to other drugs, medicaments and biological substances status: Secondary | ICD-10-CM | POA: Insufficient documentation

## 2017-08-08 DIAGNOSIS — Z89422 Acquired absence of other left toe(s): Secondary | ICD-10-CM | POA: Insufficient documentation

## 2017-08-08 DIAGNOSIS — Z886 Allergy status to analgesic agent status: Secondary | ICD-10-CM | POA: Diagnosis not present

## 2017-08-08 DIAGNOSIS — Z9221 Personal history of antineoplastic chemotherapy: Secondary | ICD-10-CM | POA: Insufficient documentation

## 2017-08-08 DIAGNOSIS — Z951 Presence of aortocoronary bypass graft: Secondary | ICD-10-CM | POA: Diagnosis not present

## 2017-08-08 DIAGNOSIS — Z800 Family history of malignant neoplasm of digestive organs: Secondary | ICD-10-CM | POA: Diagnosis not present

## 2017-08-08 DIAGNOSIS — I2510 Atherosclerotic heart disease of native coronary artery without angina pectoris: Secondary | ICD-10-CM | POA: Insufficient documentation

## 2017-08-08 DIAGNOSIS — K219 Gastro-esophageal reflux disease without esophagitis: Secondary | ICD-10-CM | POA: Insufficient documentation

## 2017-08-08 DIAGNOSIS — Z923 Personal history of irradiation: Secondary | ICD-10-CM | POA: Insufficient documentation

## 2017-08-08 DIAGNOSIS — K2970 Gastritis, unspecified, without bleeding: Secondary | ICD-10-CM | POA: Diagnosis not present

## 2017-08-08 DIAGNOSIS — I252 Old myocardial infarction: Secondary | ICD-10-CM | POA: Insufficient documentation

## 2017-08-08 DIAGNOSIS — Z79899 Other long term (current) drug therapy: Secondary | ICD-10-CM | POA: Diagnosis not present

## 2017-08-08 DIAGNOSIS — R112 Nausea with vomiting, unspecified: Secondary | ICD-10-CM | POA: Diagnosis not present

## 2017-08-08 DIAGNOSIS — Z91041 Radiographic dye allergy status: Secondary | ICD-10-CM | POA: Insufficient documentation

## 2017-08-08 DIAGNOSIS — I739 Peripheral vascular disease, unspecified: Secondary | ICD-10-CM | POA: Insufficient documentation

## 2017-08-08 HISTORY — PX: ESOPHAGOGASTRODUODENOSCOPY (EGD) WITH PROPOFOL: SHX5813

## 2017-08-08 SURGERY — ESOPHAGOGASTRODUODENOSCOPY (EGD) WITH PROPOFOL
Anesthesia: General

## 2017-08-08 MED ORDER — PROPOFOL 500 MG/50ML IV EMUL
INTRAVENOUS | Status: DC | PRN
  Administered 2017-08-08: 150 ug/kg/min via INTRAVENOUS

## 2017-08-08 MED ORDER — PROPOFOL 500 MG/50ML IV EMUL
INTRAVENOUS | Status: AC
Start: 2017-08-08 — End: 2017-08-08
  Filled 2017-08-08: qty 50

## 2017-08-08 MED ORDER — LIDOCAINE HCL (CARDIAC) PF 100 MG/5ML IV SOSY
PREFILLED_SYRINGE | INTRAVENOUS | Status: DC | PRN
  Administered 2017-08-08: 50 mg via INTRAVENOUS

## 2017-08-08 MED ORDER — LIDOCAINE HCL (PF) 2 % IJ SOLN
INTRAMUSCULAR | Status: AC
  Filled 2017-08-08: qty 10

## 2017-08-08 MED ORDER — SODIUM CHLORIDE 0.9 % IV SOLN
INTRAVENOUS | Status: DC
  Administered 2017-08-08: 1000 mL via INTRAVENOUS

## 2017-08-08 MED ORDER — PROPOFOL 10 MG/ML IV BOLUS
INTRAVENOUS | Status: DC | PRN
  Administered 2017-08-08: 30 mg via INTRAVENOUS
  Administered 2017-08-08: 40 mg via INTRAVENOUS

## 2017-08-08 NOTE — Anesthesia Procedure Notes (Signed)
Date/Time: 08/08/2017 8:50 AM Performed by: Johnna Acosta, CRNA Pre-anesthesia Checklist: Patient identified, Emergency Drugs available, Suction available, Patient being monitored and Timeout performed Patient Re-evaluated:Patient Re-evaluated prior to induction Oxygen Delivery Method: Nasal cannula Preoxygenation: Pre-oxygenation with 100% oxygen

## 2017-08-08 NOTE — H&P (Signed)
Jonathon Bellows, MD [address] [place], [place], [place], [address] [address], [place], [place], [place], [address] Phone: [phone]  Fax: [phone]  Primary Care Physician:  Lorelee Market, MD   Pre-Procedure History & Physical: HPI:  Philip [name] is a 57 y.o. male is here for an endoscopy    Past Medical History:  Diagnosis Date  . Anemia   . CAD (coronary artery disease)    a. 08/2014 Inf STEMI/CABG x 3 (LIMA->LAD, VG->Diag, RIMA->RCA).  . DVT, recurrent, lower extremity, acute, left ([place]) 2016  . GIB (gastrointestinal bleeding)    a. In the setting of DAPT-->tolerating plavix only.  . Hyperlipidemia   . Hypertensive heart disease   . Lung cancer ([place])    a. s/p chemo/radiation.  Marland Kitchen PAD (peripheral artery disease) ([place])    a. 03/2015 Periph Angio: short occlusion of L pop w/ evidence of embolization into the DP->5.0x50 mm Inova self-expanding stent;  b. 04/08/2015 ABI: R 1.12, L 0.99; c. 01/2017 LE Duplex: bilat SFA obstructive dzs, patent L pop.  . Tobacco abuse   . Toe amputation status, left [facility]) 07/09/2016   2017    Past Surgical History:  Procedure Laterality Date  . CARDIAC CATHETERIZATION N/A 08/24/2014   Procedure: Left Heart Cath and Coronary Angiography;  Surgeon: Isaias Cowman, MD;  Location: [facility];  Service: Cardiovascular;  Laterality: N/A;  . CARDIAC CATHETERIZATION N/A 08/24/2014   Procedure: Coronary Stent Intervention;  Surgeon: Isaias Cowman, MD;  Location: [facility];  Service: Cardiovascular;  Laterality: N/A;  . CORONARY ARTERY BYPASS GRAFT N/A 08/25/2014   Procedure: CORONARY ARTERY BYPASS GRAFTING (CABG), ON PUMP, TIMES THREE, USING BILATERAL MAMMARY ARTERIES, RIGHT GREATER SAPHENOUS VEIN HARVESTED ENDOSCOPICALLY;  Surgeon: Melrose Nakayama, MD;  Location: [facility];  Service: Open Heart Surgery;  Laterality: N/A;  . INCISION AND DRAINAGE ABSCESS Left 11/15/2016   Procedure: INCISION AND  DRAINAGE ABSCESS OF LEFT WRIST;  Surgeon: Roseanne Kaufman, MD;  Location: [facility];  Service: Orthopedics;  Laterality: Left;  . PERIPHERAL VASCULAR CATHETERIZATION N/A 03/30/2015   Procedure: Abdominal Aortogram w/Lower Extremity;  Surgeon: Wellington Hampshire, MD;  Location: [facility];  Service: Cardiovascular;  Laterality: N/A;  . PORTA CATH INSERTION N/A 07/27/2016   Procedure: Glori Luis Cath Insertion;  Surgeon: Algernon Huxley, MD;  Location: [facility];  Service: Cardiovascular;  Laterality: N/A;  . TEE WITHOUT CARDIOVERSION N/A 08/25/2014   Procedure: TRANSESOPHAGEAL ECHOCARDIOGRAM (TEE);  Surgeon: Melrose Nakayama, MD;  Location: [facility];  Service: Open Heart Surgery;  Laterality: N/A;    Prior to Admission medications   Medication Sig Start Date End Date Taking? Authorizing Provider  atorvastatin (LIPITOR) 40 MG tablet TAKE 1 TABLET BY MOUTH DAILY 08/02/16  Yes Wellington Hampshire, MD  benzonatate (TESSALON) 100 MG capsule TK 1 C PO TID PRN 03/16/17  Yes [provider]  buPROPion (WELLBUTRIN XL) 150 MG 24 hr tablet TK 1 T PO QD 01/18/17  Yes [provider]  Calcium Carb-Cholecalciferol (CALCIUM + D3 PO) Take 1 tablet by mouth 3 (three) times daily.   Yes [provider]  clopidogrel (PLAVIX) 75 MG tablet Take 1 tablet (75 mg total) by mouth daily. 11/14/16  Yes Wellington Hampshire, MD  diphenhydrAMINE (BENADRYL) 25 mg capsule Take 25 mg by mouth 2 (two) times daily as needed for allergies.    Yes [provider]  Lesage HFA 220 MCG/ACT inhaler INL 2  PFS PO BID 02/01/17  Yes [provider]  fluconazole (DIFLUCAN) 100 MG tablet Take 1 tablet (100 mg total) daily by mouth. 02/27/17  Yes Chrystal, Eulas Post, MD  furosemide (LASIX) 20 MG tablet Take 20 mg daily by mouth. 02/21/17  Yes [provider]  gabapentin (NEURONTIN) 300 MG capsule Take 1 capsule (300 mg total) by mouth 3 (three) times daily. 07/05/17  Yes Wellington Hampshire, MD    lidocaine-prilocaine (EMLA) cream Apply to affected area once Patient taking differently: Apply 1 application topically See admin instructions. TO AFFECTED AREA AS DIRECTED 07/31/16  Yes Corcoran, Drue Second, MD  lisinopril (PRINIVIL,ZESTRIL) 5 MG tablet TAKE 1 TABLET(5 MG) BY MOUTH DAILY 06/19/17  Yes Theora Gianotti, NP  magnesium oxide (MAG-OX) 400 (241.3 Mg) MG tablet Take 1 tablet (400 mg total) by mouth 3 (three) times daily. 05/16/17  Yes Karen Kitchens, NP  metoprolol tartrate (LOPRESSOR) 25 MG tablet TAKE 1 TABLET BY MOUTH TWICE DAILY 12/05/16  Yes Wellington Hampshire, MD  mirtazapine (REMERON) 7.5 MG tablet Take 1 tablet (7.5 mg total) by mouth at bedtime. Use for appetite stimulation. 08/02/17  Yes Karen Kitchens, NP  ondansetron (ZOFRAN-ODT) 8 MG disintegrating tablet Take 1 tablet (8 mg total) by mouth every 8 (eight) hours as needed for nausea or vomiting. 08/02/17  Yes Karen Kitchens, NP  pantoprazole (PROTONIX) 40 MG tablet TK 1 T PO QD 01/18/17  Yes [provider]  PROAIR HFA 108 (90 Base) MCG/ACT inhaler INL 2 PFS PO Q 4 TO 6 H PRN 02/01/17  Yes [provider]  promethazine (PHENERGAN) 25 MG suppository Place 1 suppository (25 mg total) rectally every 6 (six) hours as needed for nausea or vomiting. 04/11/17  Yes Corcoran, Drue Second, MD  STIOLTO RESPIMAT 2.5-2.5 MCG/ACT AERS INL 2 PFS PO QD 02/04/17  Yes [provider]  traMADol (ULTRAM) 50 MG tablet Take 1 tablet (50 mg total) by mouth every 6 (six) hours as needed. 08/02/17  Yes Karen Kitchens, NP  Vitamin D, Ergocalciferol, (DRISDOL) 50000 units CAPS capsule TK ONE C PO Q WEEK 03/11/17  Yes [provider]  potassium chloride SA (K-DUR,KLOR-CON) 20 MEQ tablet Take 1 tablet (20 mEq total) by mouth daily. 08/02/17   Karen Kitchens, NP    Allergies as of 07/31/2017 - Review Complete 07/29/2017  Allergen Reaction Noted  . Tylenol [acetaminophen] Palpitations 05/16/2017  . Amitriptyline Other (See  Comments) 03/21/2016  . Aspirin  06/21/2017  . Contrast media [iodinated diagnostic agents] Other (See Comments) 07/01/2014  . Nsaids Other (See Comments) 08/24/2014  . Ibuprofen Other (See Comments) and Nausea Only 07/06/2014  . Tape Rash and Other (See Comments) 11/15/2016    Family History  Problem Relation Age of Onset  . Hypertension Father   . Lung cancer Father   . Heart attack Mother 69       STENT  . Hyperlipidemia Mother   . Heart attack Brother   . Heart attack Brother   . Breast cancer Sister   . Heart attack Sister   . Colon cancer Paternal Grandfather     Social History   Socioeconomic History  . Marital status: Married    Spouse name: Not on file  . Number of children: Not on file  . Years of education: Not on file  . Highest education level: Not on file  Occupational History  . Not on file  Social Needs  . Financial resource strain: Not on  file  . Food insecurity:    Worry: Not on file    Inability: Not on file  . Transportation needs:    Medical: Not on file    Non-medical: Not on file  Tobacco Use  . Smoking status: Current Every Day Smoker    Packs/day: 0.50    Types: Cigarettes  . Smokeless tobacco: Never Used  . Tobacco comment: 4cigs day down from 2ppd  Substance and Sexual Activity  . Alcohol use: Yes    Alcohol/week: 0.0 oz    Comment: Sometimes on w/e.  . Drug use: No  . Sexual activity: Not on file  Lifestyle  . Physical activity:    Days per week: Not on file    Minutes per session: Not on file  . Stress: Not on file  Relationships  . Social connections:    Talks on phone: Not on file    Gets together: Not on file    Attends religious service: Not on file    Active member of club or organization: Not on file    Attends meetings of clubs or organizations: Not on file    Relationship status: Not on file  . Intimate partner violence:    Fear of current or ex partner: Not on file    Emotionally abused: Not on file     Physically abused: Not on file    Forced sexual activity: Not on file  Other Topics Concern  . Not on file  Social History Narrative  . Not on file    Review of Systems: See HPI, otherwise negative ROS  Physical Exam: BP 105/76   Pulse 86   Temp (!) 96.2 F (35.7 C) (Tympanic)   Resp 20   Ht 5\' 11"  (1.803 m)   Wt 117 lb (53.1 kg)   SpO2 100%   BMI 16.32 kg/m  General:   Alert,  pleasant and cooperative in NAD Head:  Normocephalic and atraumatic. Neck:  Supple; no masses or thyromegaly. Lungs:  Clear throughout to auscultation, normal respiratory effort.    Heart:  +S1, +S2, Regular rate and rhythm, No edema. Abdomen:  Soft, nontender and nondistended. Normal bowel sounds, without guarding, and without rebound.   Neurologic:  Alert and  oriented x4;  grossly normal neurologically.  Impression/Plan: Philip [name] is here for an endoscopy  to be performed for  evaluation of nausea and vomiting    Risks, benefits, limitations, and alternatives regarding endoscopy have been reviewed with the patient.  Questions have been answered.  All parties agreeable.   Jonathon Bellows, MD  08/08/2017, 8:06 AM

## 2017-08-08 NOTE — Anesthesia Preprocedure Evaluation (Signed)
Anesthesia Evaluation  Patient identified by MRN, date of birth, ID band Patient awake    Reviewed: Allergy & Precautions, NPO status , Patient's Chart, lab work & pertinent test results  History of Anesthesia Complications Negative for: history of anesthetic complications  Airway Mallampati: II       Dental  (+) Missing, Chipped, Poor Dentition   Pulmonary neg COPD, Current Smoker,  Lung CA, s/p chemo and radiation          Cardiovascular hypertension, Pt. on medications and Pt. on home beta blockers + Past MI and + CABG  (-) CHF (-) dysrhythmias (-) Valvular Problems/Murmurs     Neuro/Psych Seizures - (hx, no problems in years),  TIA (hx, pt denies symptoms)   GI/Hepatic Neg liver ROS, GERD  Medicated,  Endo/Other  neg diabetes  Renal/GU negative Renal ROS     Musculoskeletal   Abdominal   Peds  Hematology  (+) anemia ,   Anesthesia Other Findings   Reproductive/Obstetrics                             Anesthesia Physical Anesthesia Plan  ASA: III  Anesthesia Plan: General   Post-op Pain Management:    Induction: Intravenous  PONV Risk Score and Plan: 1 and Propofol infusion and TIVA  Airway Management Planned: Nasal Cannula  Additional Equipment:   Intra-op Plan:   Post-operative Plan:   Informed Consent: I have reviewed the patients History and Physical, chart, labs and discussed the procedure including the risks, benefits and alternatives for the proposed anesthesia with the patient or authorized representative who has indicated his/her understanding and acceptance.     Plan Discussed with:   Anesthesia Plan Comments:         Anesthesia Quick Evaluation

## 2017-08-08 NOTE — Anesthesia Postprocedure Evaluation (Signed)
Anesthesia Post Note  Patient: Philip [name]  Procedure(s) Performed: ESOPHAGOGASTRODUODENOSCOPY (EGD) WITH PROPOFOL (N/A )  Patient location during evaluation: Endoscopy Anesthesia Type: General Level of consciousness: awake and alert Pain management: pain level controlled Vital Signs Assessment: post-procedure vital signs reviewed and stable Respiratory status: spontaneous breathing and respiratory function stable Cardiovascular status: stable Anesthetic complications: no     Last Vitals:  Vitals:   08/08/17 0903 08/08/17 0904  BP: (!) 84/60 (!) 89/72  Pulse: 95 88  Resp: 18 20  Temp: (!) 36.3 C (!) 36.3 C  SpO2: 98% 100%    Last Pain:  Vitals:   08/08/17 0904  TempSrc: Tympanic  PainSc:                  Harshith Pursell K

## 2017-08-08 NOTE — Op Note (Signed)
[facility] Patient Name: Philip [name] Procedure Date: 08/08/2017 8:43 AM MRN: [id number] Account #: [id number] Date of Birth: 01-Nov-1960 Admit Type: Outpatient Age: 57 Room: [facility] ENDO ROOM 4 Gender: Male Note Status: Finalized Procedure:            Upper GI endoscopy Indications:          Nausea with vomiting Providers:            Jonathon Bellows MD, MD Referring MD:         Lorelee Market (Referring MD) Medicines:            Monitored Anesthesia Care Complications:        No immediate complications. Procedure:            Pre-Anesthesia Assessment:                       - Prior to the procedure, a History and Physical was                        performed, and patient medications, allergies and                        sensitivities were reviewed. The patient's tolerance of                        previous anesthesia was reviewed.                       - The risks and benefits of the procedure and the                        sedation options and risks were discussed with the                        patient. All questions were answered and informed                        consent was obtained.                       - ASA Grade Assessment: III - A patient with severe                        systemic disease.                       After obtaining informed consent, the endoscope was                        passed under direct vision. Throughout the procedure,                        the patient's blood pressure, pulse, and oxygen                        saturations were monitored continuously. The Endoscope                        was introduced through the mouth, and advanced to the  third part of duodenum. The upper GI endoscopy was                        accomplished with ease. The patient tolerated the                        procedure well. Findings:      The examined duodenum was normal.      The esophagus was normal.  Localized mild inflammation characterized by congestion (edema) and       erythema was found in the gastric antrum. Biopsies were taken with a       cold forceps for histology.      The cardia and gastric fundus were normal on retroflexion. Impression:           - Normal examined duodenum.                       - Normal esophagus.                       - Gastritis. Biopsied. Recommendation:       - Await pathology results.                       - Discharge patient to home (with escort).                       - Resume previous diet.                       - Continue present medications.                       - Await pathology results.                       - Return to GI office as previously scheduled. Procedure Code(s):    --- Professional ---                       [phone], Esophagogastroduodenoscopy, flexible, transoral;                        with biopsy, single or multiple Diagnosis Code(s):    --- Professional ---                       K29.70, Gastritis, unspecified, without bleeding                       R11.2, Nausea with vomiting, unspecified CPT copyright 2017 American Medical Association. All rights reserved. The codes documented in this report are preliminary and upon coder review may  be revised to meet current compliance requirements. Jonathon Bellows, MD Jonathon Bellows MD, MD 08/08/2017 8:58:29 AM This report has been signed electronically. Number of Addenda: 0 Note Initiated On: 08/08/2017 8:43 AM      [facility]

## 2017-08-08 NOTE — Anesthesia Post-op Follow-up Note (Signed)
Anesthesia QCDR form completed.        

## 2017-08-08 NOTE — Transfer of Care (Signed)
Immediate Anesthesia Transfer of Care Note  Patient: Philip [name]  Procedure(s) Performed: ESOPHAGOGASTRODUODENOSCOPY (EGD) WITH PROPOFOL (N/A )  Patient Location: PACU  Anesthesia Type:General  Level of Consciousness: awake, alert  and oriented  Airway & Oxygen Therapy: Patient Spontanous Breathing and Patient connected to nasal cannula oxygen  Post-op Assessment: Report given to RN and Post -op Vital signs reviewed and stable  Post vital signs: Reviewed and stable  Last Vitals:  Vitals Value Taken Time  BP 89/72 08/08/2017  9:04 AM  Temp 36.3 C 08/08/2017  9:04 AM  Pulse 88 08/08/2017  9:04 AM  Resp 20 08/08/2017  9:04 AM  SpO2 100 % 08/08/2017  9:04 AM    Last Pain:  Vitals:   08/08/17 0904  TempSrc: Tympanic         Complications: No apparent anesthesia complications

## 2017-08-09 ENCOUNTER — Encounter: Payer: Self-pay | Admitting: Gastroenterology

## 2017-08-09 LAB — SURGICAL PATHOLOGY

## 2017-08-11 ENCOUNTER — Encounter: Payer: Self-pay | Admitting: Gastroenterology

## 2017-08-12 ENCOUNTER — Telehealth: Payer: Self-pay

## 2017-08-12 NOTE — Telephone Encounter (Signed)
-----   Message from Wellington Hampshire, MD sent at 07/31/2017  5:59 PM EDT -----   ----- Message ----- From: Glennie Isle, CMA Sent: 07/30/2017   9:39 AM To: Wellington Hampshire, MD

## 2017-08-12 NOTE — Telephone Encounter (Signed)
Hold Plavix 7 days before procedure and resume after.

## 2017-08-26 ENCOUNTER — Encounter: Payer: Self-pay | Admitting: Emergency Medicine

## 2017-08-26 ENCOUNTER — Inpatient Hospital Stay
Admission: EM | Admit: 2017-08-26 | Discharge: 2017-08-30 | DRG: 682 | Disposition: A | Discharge status: Home / Self Care | Payer: Medicaid Other | Attending: Internal Medicine | Admitting: Internal Medicine

## 2017-08-26 ENCOUNTER — Other Ambulatory Visit: Payer: Self-pay | Admitting: *Deleted

## 2017-08-26 ENCOUNTER — Other Ambulatory Visit: Payer: Self-pay

## 2017-08-26 ENCOUNTER — Emergency Department: Payer: Medicaid Other

## 2017-08-26 ENCOUNTER — Telehealth: Payer: Self-pay | Admitting: *Deleted

## 2017-08-26 DIAGNOSIS — Z7951 Long term (current) use of inhaled steroids: Secondary | ICD-10-CM

## 2017-08-26 DIAGNOSIS — E538 Deficiency of other specified B group vitamins: Secondary | ICD-10-CM | POA: Diagnosis present

## 2017-08-26 DIAGNOSIS — Z8249 Family history of ischemic heart disease and other diseases of the circulatory system: Secondary | ICD-10-CM

## 2017-08-26 DIAGNOSIS — Z8349 Family history of other endocrine, nutritional and metabolic diseases: Secondary | ICD-10-CM

## 2017-08-26 DIAGNOSIS — E876 Hypokalemia: Secondary | ICD-10-CM | POA: Diagnosis present

## 2017-08-26 DIAGNOSIS — Z91048 Other nonmedicinal substance allergy status: Secondary | ICD-10-CM

## 2017-08-26 DIAGNOSIS — Z886 Allergy status to analgesic agent status: Secondary | ICD-10-CM

## 2017-08-26 DIAGNOSIS — Z91041 Radiographic dye allergy status: Secondary | ICD-10-CM

## 2017-08-26 DIAGNOSIS — Z801 Family history of malignant neoplasm of trachea, bronchus and lung: Secondary | ICD-10-CM

## 2017-08-26 DIAGNOSIS — M85871 Other specified disorders of bone density and structure, right ankle and foot: Secondary | ICD-10-CM | POA: Diagnosis present

## 2017-08-26 DIAGNOSIS — E878 Other disorders of electrolyte and fluid balance, not elsewhere classified: Secondary | ICD-10-CM | POA: Diagnosis not present

## 2017-08-26 DIAGNOSIS — Z800 Family history of malignant neoplasm of digestive organs: Secondary | ICD-10-CM

## 2017-08-26 DIAGNOSIS — K3184 Gastroparesis: Secondary | ICD-10-CM | POA: Diagnosis present

## 2017-08-26 DIAGNOSIS — R627 Adult failure to thrive: Secondary | ICD-10-CM | POA: Diagnosis present

## 2017-08-26 DIAGNOSIS — I252 Old myocardial infarction: Secondary | ICD-10-CM

## 2017-08-26 DIAGNOSIS — R112 Nausea with vomiting, unspecified: Secondary | ICD-10-CM

## 2017-08-26 DIAGNOSIS — G894 Chronic pain syndrome: Secondary | ICD-10-CM | POA: Diagnosis present

## 2017-08-26 DIAGNOSIS — Z8673 Personal history of transient ischemic attack (TIA), and cerebral infarction without residual deficits: Secondary | ICD-10-CM

## 2017-08-26 DIAGNOSIS — M85872 Other specified disorders of bone density and structure, left ankle and foot: Secondary | ICD-10-CM | POA: Diagnosis present

## 2017-08-26 DIAGNOSIS — F17210 Nicotine dependence, cigarettes, uncomplicated: Secondary | ICD-10-CM | POA: Diagnosis present

## 2017-08-26 DIAGNOSIS — E7800 Pure hypercholesterolemia, unspecified: Secondary | ICD-10-CM | POA: Diagnosis present

## 2017-08-26 DIAGNOSIS — Z7902 Long term (current) use of antithrombotics/antiplatelets: Secondary | ICD-10-CM

## 2017-08-26 DIAGNOSIS — R64 Cachexia: Secondary | ICD-10-CM | POA: Diagnosis present

## 2017-08-26 DIAGNOSIS — I739 Peripheral vascular disease, unspecified: Secondary | ICD-10-CM | POA: Diagnosis present

## 2017-08-26 DIAGNOSIS — E860 Dehydration: Secondary | ICD-10-CM | POA: Diagnosis present

## 2017-08-26 DIAGNOSIS — E785 Hyperlipidemia, unspecified: Secondary | ICD-10-CM | POA: Diagnosis present

## 2017-08-26 DIAGNOSIS — E2740 Unspecified adrenocortical insufficiency: Secondary | ICD-10-CM | POA: Diagnosis present

## 2017-08-26 DIAGNOSIS — E871 Hypo-osmolality and hyponatremia: Secondary | ICD-10-CM | POA: Diagnosis present

## 2017-08-26 DIAGNOSIS — E861 Hypovolemia: Secondary | ICD-10-CM | POA: Diagnosis present

## 2017-08-26 DIAGNOSIS — Z9221 Personal history of antineoplastic chemotherapy: Secondary | ICD-10-CM

## 2017-08-26 DIAGNOSIS — I119 Hypertensive heart disease without heart failure: Secondary | ICD-10-CM | POA: Diagnosis present

## 2017-08-26 DIAGNOSIS — E43 Unspecified severe protein-calorie malnutrition: Secondary | ICD-10-CM | POA: Diagnosis present

## 2017-08-26 DIAGNOSIS — Z803 Family history of malignant neoplasm of breast: Secondary | ICD-10-CM

## 2017-08-26 DIAGNOSIS — N179 Acute kidney failure, unspecified: Principal | ICD-10-CM | POA: Diagnosis present

## 2017-08-26 DIAGNOSIS — K219 Gastro-esophageal reflux disease without esophagitis: Secondary | ICD-10-CM | POA: Diagnosis present

## 2017-08-26 DIAGNOSIS — C3412 Malignant neoplasm of upper lobe, left bronchus or lung: Secondary | ICD-10-CM | POA: Diagnosis present

## 2017-08-26 DIAGNOSIS — Z951 Presence of aortocoronary bypass graft: Secondary | ICD-10-CM

## 2017-08-26 DIAGNOSIS — Z923 Personal history of irradiation: Secondary | ICD-10-CM

## 2017-08-26 DIAGNOSIS — I2510 Atherosclerotic heart disease of native coronary artery without angina pectoris: Secondary | ICD-10-CM | POA: Diagnosis present

## 2017-08-26 DIAGNOSIS — Z681 Body mass index (BMI) 19 or less, adult: Secondary | ICD-10-CM

## 2017-08-26 DIAGNOSIS — C799 Secondary malignant neoplasm of unspecified site: Secondary | ICD-10-CM | POA: Diagnosis present

## 2017-08-26 LAB — COMPREHENSIVE METABOLIC PANEL
ALBUMIN: 3.1 g/dL — AB (ref 3.5–5.0)
ALK PHOS: 79 U/L (ref 38–126)
ALT: 15 U/L — AB (ref 17–63)
ANION GAP: 28 — AB (ref 5–15)
AST: 29 U/L (ref 15–41)
BILIRUBIN TOTAL: 2.1 mg/dL — AB (ref 0.3–1.2)
BUN: 14 mg/dL (ref 6–20)
CALCIUM: 8.9 mg/dL (ref 8.9–10.3)
CO2: 17 mmol/L — ABNORMAL LOW (ref 22–32)
CREATININE: 1.19 mg/dL (ref 0.61–1.24)
Chloride: 84 mmol/L — ABNORMAL LOW (ref 101–111)
GFR calc Af Amer: >60 mL/min (ref >60)
GFR calc non Af Amer: >60 mL/min (ref >60)
GLUCOSE: 85 mg/dL (ref 65–99)
Potassium: 2.5 mmol/L — CL (ref 3.5–5.1)
Sodium: 129 mmol/L — ABNORMAL LOW (ref 135–145)
Total Protein: 6.4 g/dL — ABNORMAL LOW (ref 6.5–8.1)

## 2017-08-26 LAB — LACTIC ACID, PLASMA: LACTIC ACID, VENOUS: 2.4 mmol/L — AB (ref 0.5–1.9)

## 2017-08-26 LAB — CBC WITH DIFFERENTIAL/PLATELET
BASOS PCT: 1 %
Basophils Absolute: 0 10*3/uL (ref 0–0.1)
EOS ABS: 0 10*3/uL (ref 0–0.7)
EOS PCT: 0 %
HCT: 38.6 % — ABNORMAL LOW (ref 40.0–52.0)
Hemoglobin: 13.9 g/dL (ref 13.0–18.0)
Lymphocytes Relative: 15 %
Lymphs Abs: 0.7 10*3/uL — ABNORMAL LOW (ref 1.0–3.6)
MCH: 37 pg — AB (ref 26.0–34.0)
MCHC: 35.9 g/dL (ref 32.0–36.0)
MCV: 103 fL — ABNORMAL HIGH (ref 80.0–100.0)
MONOS PCT: 12 %
Monocytes Absolute: 0.6 10*3/uL (ref 0.2–1.0)
NEUTROS PCT: 72 %
Neutro Abs: 3.2 10*3/uL (ref 1.4–6.5)
PLATELETS: 232 10*3/uL (ref 150–440)
RBC: 3.74 MIL/uL — ABNORMAL LOW (ref 4.40–5.90)
RDW: 14.7 % — AB (ref 11.5–14.5)
WBC: 4.5 10*3/uL (ref 3.8–10.6)

## 2017-08-26 LAB — TROPONIN I: Troponin I: <0.03 ng/mL (ref <0.03)

## 2017-08-26 LAB — LIPASE, BLOOD: Lipase: 37 U/L (ref 11–51)

## 2017-08-26 MED ORDER — POTASSIUM CHLORIDE CRYS ER 20 MEQ PO TBCR: 40.0000 meq | EXTENDED_RELEASE_TABLET | Freq: Once | ORAL | Status: DC

## 2017-08-26 MED ORDER — PROMETHAZINE HCL 25 MG/ML IJ SOLN
25.0000 mg | Freq: Once | INTRAMUSCULAR | Status: AC
  Administered 2017-08-26: 25 mg via INTRAVENOUS
  Filled 2017-08-26: qty 1

## 2017-08-26 MED ORDER — SODIUM CHLORIDE 0.9 % IV BOLUS
1000.0000 mL | Freq: Once | INTRAVENOUS | Status: AC
  Administered 2017-08-26: 1000 mL via INTRAVENOUS

## 2017-08-26 NOTE — Telephone Encounter (Signed)
Pt called in to request appt for IVF due to nausea, vomiting. Pt states has been unable to eat or drink very much due to his nausea/vomiting and would like come in for IVF. Pt given appt to be seen in [facility] at 8:30am on 5/14. Pt has been informed of appt. Understanding verbalized.

## 2017-08-26 NOTE — ED Provider Notes (Signed)
[facility] Emergency Department Provider Note   ____________________________________________   I have reviewed the triage vital signs and the nursing notes.   HISTORY  Chief Complaint Abd pain, nausea and vomiting  History limited by: Not Limited   HPI Philip [name] is a 57 y.o. male who presents to the emergency department today because of concerns for nausea vomiting and abdominal pain.  Patient states that he finished up chemo and radiation therapy a couple months ago.  He states that since that time he has had issues with his stomach.  States for the past couple of days it has become worse.  He has multiple episodes of vomiting.  He has decreased appetite.  Describes the pain as being located under his ribs.  No fevers.  Per medical record review patient has a history of metastatic lung cancer  Past Medical History:  Diagnosis Date  . Anemia   . CAD (coronary artery disease)    a. 08/2014 Inf STEMI/CABG x 3 (LIMA->LAD, VG->Diag, RIMA->RCA).  . DVT, recurrent, lower extremity, acute, left ([place]) 2016  . GIB (gastrointestinal bleeding)    a. In the setting of DAPT-->tolerating plavix only.  . Hyperlipidemia   . Hypertensive heart disease   . Lung cancer ([place])    a. s/p chemo/radiation.  Marland Kitchen PAD (peripheral artery disease) ([place])    a. 03/2015 Periph Angio: short occlusion of L pop w/ evidence of embolization into the DP->5.0x50 mm Inova self-expanding stent;  b. 04/08/2015 ABI: R 1.12, L 0.99; c. 01/2017 LE Duplex: bilat SFA obstructive dzs, patent L pop.  . Tobacco abuse   . Toe amputation status, left [facility] 07/09/2016   2017    Patient Active Problem List   Diagnosis Date Noted  . Weakness 08/02/2017  . Lung cancer metastatic to bone ([place]) 04/24/2017  . 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 Marijuana user 04/24/2017  . Abnormal drug screen 04/24/2017  . Skin abscess 04/17/2017  . Non-intractable vomiting with nausea 01/20/2017  .  Hypokalemia 01/20/2017  . Dehydration 01/20/2017  . Abscess of left hand 11/13/2016  . Chronic thoracic spine pain 10/28/2016  . Antineoplastic chemotherapy induced anemia 10/28/2016  . Encounter for antineoplastic chemotherapy 09/10/2016  . Charcot's joint of foot (Right) 08/21/2016  . GERD (gastroesophageal reflux disease) 08/21/2016  . Myocardial infarction ([place]) 08/21/2016  . Hyponatremia 08/19/2016  . Hypocalcemia 08/19/2016  . Hypomagnesemia 08/19/2016  . Routine history and physical examination of adult 08/09/2016  . Cancer of upper lobe of left lung ([place]) 07/31/2016  . Cancer related pain 07/31/2016  . Cough 07/31/2016  . Goals of care, counseling/discussion 07/26/2016  . Acute CVA (cerebrovascular accident) ([place]) 07/26/2016  . 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 Postobstructive pneumonia 07/17/2016  . Bone metastasis ([place]) 07/12/2016  . Lymphadenopathy, mediastinal 07/02/2016  . Weight loss 07/02/2016  . Night sweats 07/02/2016  . Chronic foot pain (Left) 03/21/2016  . Chronic pain syndrome 03/21/2016  . Neuropathic pain 03/21/2016  . Iron deficiency anemia 01/08/2016  . Dry gangrene ([place]) 01/02/2016  . Osteopenia of both feet 12/10/2015  . Peripheral neuropathy 06/12/2015  . Hyperlipidemia, unspecified   . PAD (peripheral artery disease) ([place]) 03/29/2015  . Hypertension 10/05/2014  . Pure hypercholesterolemia 10/05/2014  . Acute sinusitis, unspecified 09/14/2014  . Rash and other nonspecific skin eruption 09/14/2014  . S/P CABG x 3 08/25/2014  . Status post aorto-coronary artery bypass graft 08/25/2014  . History of MI (myocardial infarction) 08/24/2014  . CAD (coronary  artery disease), native coronary artery 08/24/2014  . Acute myocardial infarction of inferior wall ([place]) 08/24/2014  . Right ankle pain 07/20/2014  . Pain in joint involving ankle and foot 07/20/2014  . Bilateral foot pain 07/06/2014  . Tobacco use disorder 07/06/2014  . Convulsions ([place])  07/06/2014  . Preventative health care 07/06/2014  . Pain in soft tissues of limb 07/06/2014    Past Surgical History:  Procedure Laterality Date  . CARDIAC CATHETERIZATION N/A 08/24/2014   Procedure: Left Heart Cath and Coronary Angiography;  Surgeon: Isaias Cowman, MD;  Location: [place] CV [facility];  Service: Cardiovascular;  Laterality: N/A;  . CARDIAC CATHETERIZATION N/A 08/24/2014   Procedure: Coronary Stent Intervention;  Surgeon: Isaias Cowman, MD;  Location: [place] [facility];  Service: Cardiovascular;  Laterality: N/A;  . CORONARY ARTERY BYPASS GRAFT N/A 08/25/2014   Procedure: CORONARY ARTERY BYPASS GRAFTING (CABG), ON PUMP, TIMES THREE, USING BILATERAL MAMMARY ARTERIES, RIGHT GREATER SAPHENOUS VEIN HARVESTED ENDOSCOPICALLY;  Surgeon: Melrose Nakayama, MD;  Location: [place];  Service: Open Heart Surgery;  Laterality: N/A;  . ESOPHAGOGASTRODUODENOSCOPY (EGD) WITH PROPOFOL N/A 08/08/2017   Procedure: ESOPHAGOGASTRODUODENOSCOPY (EGD) WITH PROPOFOL;  Surgeon: Jonathon Bellows, MD;  [facility] ENDOSCOPY;  Service: Gastroenterology;  Laterality: N/A;  . INCISION AND DRAINAGE ABSCESS Left 11/15/2016   Procedure: INCISION AND DRAINAGE ABSCESS OF LEFT WRIST;  Surgeon: Roseanne Kaufman, MD;  Location: [place];  Service: Orthopedics;  Laterality: Left;  . PERIPHERAL VASCULAR CATHETERIZATION N/A 03/30/2015   Procedure: Abdominal Aortogram w/Lower Extremity;  Surgeon: Wellington Hampshire, MD;  Location: [place] [facility];  Service: Cardiovascular;  Laterality: N/A;  . PORTA CATH INSERTION N/A 07/27/2016   Procedure: Glori Luis Cath Insertion;  Surgeon: Algernon Huxley, MD;  Location: [place] [facility] LAB;  Service: Cardiovascular;  Laterality: N/A;  . TEE WITHOUT CARDIOVERSION N/A 08/25/2014   Procedure: TRANSESOPHAGEAL ECHOCARDIOGRAM (TEE);  Surgeon: Melrose Nakayama, MD;  Location: [place];  Service: Open Heart Surgery;  Laterality: N/A;    Prior to Admission medications   Medication Sig  Start Date End Date Taking? Authorizing Provider  atorvastatin (LIPITOR) 40 MG tablet TAKE 1 TABLET BY MOUTH DAILY 08/02/16   Wellington Hampshire, MD  benzonatate (TESSALON) 100 MG capsule TK 1 C PO TID PRN 03/16/17   [provider]  buPROPion (WELLBUTRIN XL) 150 MG 24 hr tablet TK 1 T PO QD 01/18/17   [provider]  Calcium Carb-Cholecalciferol (CALCIUM + D3 PO) Take 1 tablet by mouth 3 (three) times daily.    [provider]  clopidogrel (PLAVIX) 75 MG tablet Take 1 tablet (75 mg total) by mouth daily. 11/14/16   Wellington Hampshire, MD  diphenhydrAMINE (BENADRYL) 25 mg capsule Take 25 mg by mouth 2 (two) times daily as needed for allergies.     [provider]  FLOVENT HFA 220 MCG/ACT inhaler INL 2 PFS PO BID 02/01/17   [provider]  fluconazole (DIFLUCAN) 100 MG tablet Take 1 tablet (100 mg total) daily by mouth. 02/27/17   Noreene Filbert, MD  furosemide (LASIX) 20 MG tablet Take 20 mg daily by mouth. 02/21/17   [provider]  gabapentin (NEURONTIN) 300 MG capsule Take 1 capsule (300 mg total) by mouth 3 (three) times daily. 07/05/17   Wellington Hampshire, MD  lidocaine-prilocaine (EMLA) cream Apply to affected area once Patient taking differently: Apply 1 application topically See admin instructions. TO AFFECTED AREA AS DIRECTED 07/31/16   Lequita Asal, MD  lisinopril (PRINIVIL,ZESTRIL) 5 MG tablet TAKE 1 TABLET(5 MG) BY MOUTH DAILY 06/19/17   Theora Gianotti, NP  magnesium oxide (MAG-OX) 400 (241.3 Mg) MG tablet Take 1 tablet (400 mg total) by mouth 3 (three) times daily. 05/16/17   Karen Kitchens, NP  metoprolol tartrate (LOPRESSOR) 25 MG tablet TAKE 1 TABLET BY MOUTH TWICE DAILY 12/05/16   Wellington Hampshire, MD  mirtazapine (REMERON) 7.5 MG tablet Take 1 tablet (7.5 mg total) by mouth at bedtime. Use for appetite stimulation. 08/02/17   Karen Kitchens, NP  ondansetron (ZOFRAN-ODT) 8 MG disintegrating tablet Take 1 tablet (8 mg total)  by mouth every 8 (eight) hours as needed for nausea or vomiting. 08/02/17   Karen Kitchens, NP  pantoprazole (PROTONIX) 40 MG tablet TK 1 T PO QD 01/18/17   [provider]  potassium chloride SA (K-DUR,KLOR-CON) 20 MEQ tablet Take 1 tablet (20 mEq total) by mouth daily. 08/02/17   Karen Kitchens, NP  PROAIR HFA 108 [phone] Base) MCG/ACT inhaler INL 2 PFS PO Q 4 TO 6 H PRN 02/01/17   [provider]  promethazine (PHENERGAN) 25 MG suppository Place 1 suppository (25 mg total) rectally every 6 (six) hours as needed for nausea or vomiting. 04/11/17   Lequita Asal, MD  STIOLTO RESPIMAT 2.5-2.5 MCG/ACT AERS INL 2 PFS PO QD 02/04/17   [provider]  traMADol (ULTRAM) 50 MG tablet Take 1 tablet (50 mg total) by mouth every 6 (six) hours as needed. 08/02/17   Karen Kitchens, NP  Vitamin D, Ergocalciferol, (DRISDOL) 50000 units CAPS capsule TK ONE C PO Q WEEK 03/11/17   [provider]    Allergies Tylenol [acetaminophen]; Amitriptyline; Aspirin; Contrast media [iodinated diagnostic agents]; Nsaids; Ibuprofen; and Tape  Family History  Problem Relation Age of Onset  . Hypertension Father   . Lung cancer Father   . Heart attack Mother 62       STENT  . Hyperlipidemia Mother   . Heart attack Brother   . Heart attack Brother   . Breast cancer Sister   . Heart attack Sister   . Colon cancer Paternal Grandfather     Social History Social History   Tobacco Use  . Smoking status: Current Every Day Smoker    Packs/day: 0.50    Types: Cigarettes  . Smokeless tobacco: Never Used  . Tobacco comment: 4cigs day down from 2ppd  Substance Use Topics  . Alcohol use: Yes    Alcohol/week: 0.0 oz    Comment: Sometimes on w/e.  . Drug use: No    Review of Systems Constitutional: No fever/chills Eyes: No visual changes. ENT: No sore throat. Cardiovascular: Denies chest pain. Respiratory: Denies shortness of breath. Gastrointestinal: Positive for abdominal pain,  nausea and vomiting. Genitourinary: Negative for dysuria. Musculoskeletal: Negative for back pain. Skin: Negative for rash. Neurological: Negative for headaches, focal weakness or numbness.  ____________________________________________   PHYSICAL EXAM:  VITAL SIGNS: ED Triage Vitals  Enc Vitals Group     BP 08/26/17 2100 (!) 146/110     Pulse Rate 08/26/17 2100 (!) 145     Resp 08/26/17 2100 20     Temp 08/26/17 2114 97.6 F (36.4 C)     Temp Source 08/26/17 2114 Oral     SpO2 08/26/17 2100 95 %     Weight 08/26/17 2101 116 lb (52.6 kg)     Height 08/26/17 2101 5\' 11"  (1.803 m)     Head  Circumference --      Peak Flow --      Pain Score 08/26/17 2116 8   Constitutional: Alert and oriented. Chronically ill appearing. Eyes: Conjunctivae are normal.  ENT   Head: Normocephalic and atraumatic.   Nose: No congestion/rhinnorhea.   Mouth/Throat: Mucous membranes are moist.   Neck: No stridor. Hematological/Lymphatic/Immunilogical: No cervical lymphadenopathy. Cardiovascular: Normal rate, regular rhythm.  No murmurs, rubs, or gallops.  Respiratory: Normal respiratory effort without tachypnea nor retractions. Breath sounds are clear and equal bilaterally. No wheezes/rales/rhonchi. Gastrointestinal: Soft and non tender. No rebound. No guarding.  Genitourinary: Deferred Musculoskeletal: Normal range of motion in all extremities. No lower extremity edema. Neurologic:  Normal speech and language. No gross focal neurologic deficits are appreciated.  Skin:  Skin is warm, dry and intact. No rash noted. Psychiatric: Mood and affect are normal. Speech and behavior are normal. Patient exhibits appropriate insight and judgment.  ____________________________________________    LABS (pertinent positives/negatives)  Lactic acid 2.4 Lipase 37 Trop <0.03 CBC wbc 4.5, hgb 13.9, plt 232 CMP na 129, k 2.5, glu 85, cr  1.19  ____________________________________________   EKG  I, Nance Pear, attending physician, personally viewed and interpreted this EKG  EKG Time: 2107 Rate: 126 Rhythm: sinus tachycardia Axis: left axis deviation Intervals: qtc 465 QRS: narrow, q waves II, III, aVF ST changes: no st elevation Impression: abnormal ekg  ____________________________________________    RADIOLOGY  CXR No acute disease  ____________________________________________   PROCEDURES  Procedures  ____________________________________________   INITIAL IMPRESSION / ASSESSMENT AND PLAN / ED COURSE  Pertinent labs & imaging results that were available during my care of the patient were reviewed by me and considered in my medical decision making (see chart for details).  Patient presented to the emergency department today because of concerns for abdominal pain nausea and vomiting.  The symptoms have been somewhat chronic since chemo and radiation therapy.  Blood work is concerning for dehydration and GI loss with hyperkalemia.  No source of infection.  Will plan admission to the hospital service for IV hydration.  Discussed findings with patient.  ____________________________________________   FINAL CLINICAL IMPRESSION(S) / ED DIAGNOSES  Final diagnoses:  Dehydration  Hypokalemia  Nausea and vomiting, intractability of vomiting not specified, unspecified vomiting type     Note: This dictation was prepared with Dragon dictation. Any transcriptional errors that result from this process are unintentional     Nance Pear, MD 08/27/17 [phone]

## 2017-08-26 NOTE — ED Triage Notes (Addendum)
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

## 2017-08-27 ENCOUNTER — Inpatient Hospital Stay: Payer: Medicaid Other

## 2017-08-27 ENCOUNTER — Other Ambulatory Visit: Payer: Self-pay

## 2017-08-27 ENCOUNTER — Encounter: Payer: Self-pay | Admitting: Internal Medicine

## 2017-08-27 ENCOUNTER — Inpatient Hospital Stay: Payer: Medicaid Other | Admitting: Oncology

## 2017-08-27 DIAGNOSIS — R64 Cachexia: Secondary | ICD-10-CM | POA: Diagnosis present

## 2017-08-27 DIAGNOSIS — I493 Ventricular premature depolarization: Secondary | ICD-10-CM

## 2017-08-27 DIAGNOSIS — M85871 Other specified disorders of bone density and structure, right ankle and foot: Secondary | ICD-10-CM | POA: Diagnosis present

## 2017-08-27 DIAGNOSIS — E871 Hypo-osmolality and hyponatremia: Secondary | ICD-10-CM | POA: Diagnosis present

## 2017-08-27 DIAGNOSIS — E7800 Pure hypercholesterolemia, unspecified: Secondary | ICD-10-CM | POA: Diagnosis present

## 2017-08-27 DIAGNOSIS — R112 Nausea with vomiting, unspecified: Secondary | ICD-10-CM | POA: Diagnosis not present

## 2017-08-27 DIAGNOSIS — Z923 Personal history of irradiation: Secondary | ICD-10-CM

## 2017-08-27 DIAGNOSIS — I739 Peripheral vascular disease, unspecified: Secondary | ICD-10-CM | POA: Diagnosis present

## 2017-08-27 DIAGNOSIS — I252 Old myocardial infarction: Secondary | ICD-10-CM | POA: Diagnosis not present

## 2017-08-27 DIAGNOSIS — I2510 Atherosclerotic heart disease of native coronary artery without angina pectoris: Secondary | ICD-10-CM

## 2017-08-27 DIAGNOSIS — Z8673 Personal history of transient ischemic attack (TIA), and cerebral infarction without residual deficits: Secondary | ICD-10-CM | POA: Diagnosis not present

## 2017-08-27 DIAGNOSIS — E43 Unspecified severe protein-calorie malnutrition: Secondary | ICD-10-CM

## 2017-08-27 DIAGNOSIS — C7951 Secondary malignant neoplasm of bone: Secondary | ICD-10-CM

## 2017-08-27 DIAGNOSIS — G894 Chronic pain syndrome: Secondary | ICD-10-CM | POA: Diagnosis present

## 2017-08-27 DIAGNOSIS — M85872 Other specified disorders of bone density and structure, left ankle and foot: Secondary | ICD-10-CM | POA: Diagnosis present

## 2017-08-27 DIAGNOSIS — R0781 Pleurodynia: Secondary | ICD-10-CM | POA: Diagnosis not present

## 2017-08-27 DIAGNOSIS — E860 Dehydration: Secondary | ICD-10-CM | POA: Diagnosis present

## 2017-08-27 DIAGNOSIS — E785 Hyperlipidemia, unspecified: Secondary | ICD-10-CM

## 2017-08-27 DIAGNOSIS — N179 Acute kidney failure, unspecified: Secondary | ICD-10-CM | POA: Diagnosis present

## 2017-08-27 DIAGNOSIS — K219 Gastro-esophageal reflux disease without esophagitis: Secondary | ICD-10-CM | POA: Diagnosis present

## 2017-08-27 DIAGNOSIS — Z79899 Other long term (current) drug therapy: Secondary | ICD-10-CM

## 2017-08-27 DIAGNOSIS — E878 Other disorders of electrolyte and fluid balance, not elsewhere classified: Secondary | ICD-10-CM | POA: Diagnosis not present

## 2017-08-27 DIAGNOSIS — C3412 Malignant neoplasm of upper lobe, left bronchus or lung: Secondary | ICD-10-CM | POA: Diagnosis present

## 2017-08-27 DIAGNOSIS — I119 Hypertensive heart disease without heart failure: Secondary | ICD-10-CM | POA: Diagnosis present

## 2017-08-27 DIAGNOSIS — F17210 Nicotine dependence, cigarettes, uncomplicated: Secondary | ICD-10-CM

## 2017-08-27 DIAGNOSIS — C3490 Malignant neoplasm of unspecified part of unspecified bronchus or lung: Secondary | ICD-10-CM | POA: Diagnosis not present

## 2017-08-27 DIAGNOSIS — R634 Abnormal weight loss: Secondary | ICD-10-CM

## 2017-08-27 DIAGNOSIS — Z7902 Long term (current) use of antithrombotics/antiplatelets: Secondary | ICD-10-CM | POA: Diagnosis not present

## 2017-08-27 DIAGNOSIS — C799 Secondary malignant neoplasm of unspecified site: Secondary | ICD-10-CM | POA: Diagnosis present

## 2017-08-27 DIAGNOSIS — Z9221 Personal history of antineoplastic chemotherapy: Secondary | ICD-10-CM

## 2017-08-27 DIAGNOSIS — E2740 Unspecified adrenocortical insufficiency: Secondary | ICD-10-CM | POA: Diagnosis present

## 2017-08-27 DIAGNOSIS — Z86718 Personal history of other venous thrombosis and embolism: Secondary | ICD-10-CM

## 2017-08-27 DIAGNOSIS — Z951 Presence of aortocoronary bypass graft: Secondary | ICD-10-CM | POA: Diagnosis not present

## 2017-08-27 DIAGNOSIS — Z681 Body mass index (BMI) 19 or less, adult: Secondary | ICD-10-CM | POA: Diagnosis not present

## 2017-08-27 LAB — CBC
HCT: 31.8 % — ABNORMAL LOW (ref 40.0–52.0)
Hemoglobin: 11.3 g/dL — ABNORMAL LOW (ref 13.0–18.0)
MCH: 36.1 pg — ABNORMAL HIGH (ref 26.0–34.0)
MCHC: 35.5 g/dL (ref 32.0–36.0)
MCV: 101.8 fL — ABNORMAL HIGH (ref 80.0–100.0)
PLATELETS: 180 10*3/uL (ref 150–440)
RBC: 3.12 MIL/uL — AB (ref 4.40–5.90)
RDW: 14.9 % — AB (ref 11.5–14.5)
WBC: 2.5 10*3/uL — AB (ref 3.8–10.6)

## 2017-08-27 LAB — BASIC METABOLIC PANEL
Anion gap: 14 (ref 5–15)
BUN: 12 mg/dL (ref 6–20)
CO2: 24 mmol/L (ref 22–32)
CREATININE: 0.97 mg/dL (ref 0.61–1.24)
Calcium: 8 mg/dL — ABNORMAL LOW (ref 8.9–10.3)
Chloride: 92 mmol/L — ABNORMAL LOW (ref 101–111)
Glucose, Bld: 79 mg/dL (ref 65–99)
Potassium: 3.4 mmol/L — ABNORMAL LOW (ref 3.5–5.1)
SODIUM: 130 mmol/L — AB (ref 135–145)

## 2017-08-27 LAB — URINALYSIS, COMPLETE (UACMP) WITH MICROSCOPIC
BACTERIA UA: NONE SEEN
BILIRUBIN URINE: NEGATIVE
GLUCOSE, UA: NEGATIVE mg/dL
Hgb urine dipstick: NEGATIVE
KETONES UR: 20 mg/dL — AB
Leukocytes, UA: NEGATIVE
Nitrite: NEGATIVE
PH: 5 (ref 5.0–8.0)
PROTEIN: NEGATIVE mg/dL
Specific Gravity, Urine: 1.01 (ref 1.005–1.030)

## 2017-08-27 LAB — FOLATE: FOLATE: 5.9 ng/mL — AB (ref >5.9)

## 2017-08-27 LAB — PHOSPHORUS: Phosphorus: 3.2 mg/dL (ref 2.5–4.6)

## 2017-08-27 LAB — MAGNESIUM: Magnesium: 3.4 mg/dL — ABNORMAL HIGH (ref 1.7–2.4)

## 2017-08-27 LAB — LACTIC ACID, PLASMA: Lactic Acid, Venous: 0.6 mmol/L (ref 0.5–1.9)

## 2017-08-27 LAB — VITAMIN B12: Vitamin B-12: 542 pg/mL (ref 180–914)

## 2017-08-27 LAB — MRSA PCR SCREENING: MRSA BY PCR: POSITIVE — AB

## 2017-08-27 LAB — PREALBUMIN: PREALBUMIN: 11.9 mg/dL — AB (ref 18–38)

## 2017-08-27 MED ORDER — FOLIC ACID 1 MG PO TABS: 1.0000 mg | ORAL_TABLET | Freq: Every day | ORAL | Status: DC

## 2017-08-27 MED ORDER — METOPROLOL TARTRATE 25 MG PO TABS
25.0000 mg | ORAL_TABLET | Freq: Two times a day (BID) | ORAL | Status: DC
  Administered 2017-08-27 – 2017-08-30 (×6): 25 mg via ORAL
  Filled 2017-08-27 (×6): qty 1

## 2017-08-27 MED ORDER — SODIUM CHLORIDE 0.9% FLUSH
10.0000 mL | Freq: Two times a day (BID) | INTRAVENOUS | Status: DC
  Administered 2017-08-27 – 2017-08-30 (×6): 10 mL

## 2017-08-27 MED ORDER — VITAMIN B-12 1000 MCG PO TABS: 1000.0000 ug | ORAL_TABLET | Freq: Every day | ORAL | Status: DC

## 2017-08-27 MED ORDER — DIPHENHYDRAMINE HCL 50 MG/ML IJ SOLN
12.5000 mg | Freq: Three times a day (TID) | INTRAMUSCULAR | Status: DC | PRN
  Administered 2017-08-27 – 2017-08-29 (×5): 12.5 mg via INTRAVENOUS
  Filled 2017-08-27 (×5): qty 1

## 2017-08-27 MED ORDER — ENSURE ENLIVE PO LIQD
237.0000 mL | Freq: Three times a day (TID) | ORAL | Status: DC
  Administered 2017-08-28 – 2017-08-30 (×5): 237 mL via ORAL

## 2017-08-27 MED ORDER — CLOPIDOGREL BISULFATE 75 MG PO TABS
75.0000 mg | ORAL_TABLET | Freq: Every day | ORAL | Status: DC
  Administered 2017-08-27 – 2017-08-30 (×4): 75 mg via ORAL
  Filled 2017-08-27 (×4): qty 1

## 2017-08-27 MED ORDER — ENOXAPARIN SODIUM 40 MG/0.4ML ~~LOC~~ SOLN
40.0000 mg | SUBCUTANEOUS | Status: DC
  Administered 2017-08-27 – 2017-08-28 (×2): 40 mg via SUBCUTANEOUS
  Filled 2017-08-27 (×2): qty 0.4

## 2017-08-27 MED ORDER — FOLIC ACID 1 MG PO TABS
1.0000 mg | ORAL_TABLET | Freq: Every day | ORAL | Status: DC
  Administered 2017-08-28 – 2017-08-29 (×2): 1 mg via ORAL
  Filled 2017-08-27 (×2): qty 1

## 2017-08-27 MED ORDER — PROMETHAZINE HCL 25 MG/ML IJ SOLN: 25.0000 mg | Freq: Four times a day (QID) | INTRAMUSCULAR | Status: DC | PRN

## 2017-08-27 MED ORDER — CHLORHEXIDINE GLUCONATE CLOTH 2 % EX PADS
6.0000 | MEDICATED_PAD | Freq: Every day | CUTANEOUS | Status: DC
  Administered 2017-08-27 – 2017-08-30 (×3): 6 via TOPICAL

## 2017-08-27 MED ORDER — PREDNISONE 20 MG PO TABS
40.0000 mg | ORAL_TABLET | Freq: Every day | ORAL | Status: DC
  Administered 2017-08-27 – 2017-08-30 (×4): 40 mg via ORAL
  Filled 2017-08-27 (×4): qty 2

## 2017-08-27 MED ORDER — ONDANSETRON HCL 4 MG/2ML IJ SOLN: 4.0000 mg | Freq: Four times a day (QID) | INTRAMUSCULAR | Status: DC | PRN

## 2017-08-27 MED ORDER — SODIUM CHLORIDE 0.9% FLUSH
10.0000 mL | INTRAVENOUS | Status: DC | PRN
  Administered 2017-08-30: 20 mL
  Filled 2017-08-27: qty 40

## 2017-08-27 MED ORDER — BISACODYL 5 MG PO TBEC: 5.0000 mg | DELAYED_RELEASE_TABLET | Freq: Every day | ORAL | Status: DC | PRN

## 2017-08-27 MED ORDER — OLANZAPINE 10 MG PO TBDP
10.0000 mg | ORAL_TABLET | Freq: Every day | ORAL | Status: DC
  Administered 2017-08-28 – 2017-08-29 (×2): 10 mg via ORAL
  Filled 2017-08-27 (×4): qty 1

## 2017-08-27 MED ORDER — MORPHINE SULFATE (PF) 4 MG/ML IV SOLN
4.0000 mg | INTRAVENOUS | Status: DC | PRN
Start: 2017-08-27 — End: 2017-08-30
  Administered 2017-08-27 – 2017-08-30 (×12): 4 mg via INTRAVENOUS
  Filled 2017-08-27 (×13): qty 1

## 2017-08-27 MED ORDER — SENNOSIDES-DOCUSATE SODIUM 8.6-50 MG PO TABS: 1.0000 | ORAL_TABLET | Freq: Every evening | ORAL | Status: DC | PRN

## 2017-08-27 MED ORDER — METOCLOPRAMIDE HCL 5 MG PO TABS
5.0000 mg | ORAL_TABLET | Freq: Three times a day (TID) | ORAL | Status: DC
  Administered 2017-08-27 – 2017-08-30 (×11): 5 mg via ORAL
  Filled 2017-08-27 (×11): qty 1

## 2017-08-27 MED ORDER — MUPIROCIN 2 % EX OINT
1.0000 "application " | TOPICAL_OINTMENT | Freq: Two times a day (BID) | CUTANEOUS | Status: DC
  Administered 2017-08-27 – 2017-08-30 (×7): 1 via NASAL
  Filled 2017-08-27: qty 22

## 2017-08-27 MED ORDER — ADULT MULTIVITAMIN W/MINERALS CH
1.0000 | ORAL_TABLET | Freq: Every day | ORAL | Status: DC
  Administered 2017-08-28 – 2017-08-30 (×3): 1 via ORAL
  Filled 2017-08-27 (×3): qty 1

## 2017-08-27 MED ORDER — KCL-LACTATED RINGERS 20 MEQ/L IV SOLN
INTRAVENOUS | Status: DC
  Administered 2017-08-27 (×2): via INTRAVENOUS
  Filled 2017-08-27 (×3): qty 1000

## 2017-08-27 MED ORDER — PANTOPRAZOLE SODIUM 40 MG PO TBEC
40.0000 mg | DELAYED_RELEASE_TABLET | Freq: Every day | ORAL | Status: DC
  Administered 2017-08-27: 40 mg via ORAL
  Filled 2017-08-27: qty 1

## 2017-08-27 MED ORDER — POTASSIUM CHLORIDE 10 MEQ/100ML IV SOLN
10.0000 meq | INTRAVENOUS | Status: AC
  Administered 2017-08-27 (×6): 10 meq via INTRAVENOUS
  Filled 2017-08-27 (×6): qty 100

## 2017-08-27 MED ORDER — MAGNESIUM SULFATE 2 GM/50ML IV SOLN
2.0000 g | INTRAVENOUS | Status: AC
  Administered 2017-08-27 (×2): 2 g via INTRAVENOUS
  Filled 2017-08-27 (×2): qty 50

## 2017-08-27 MED ORDER — PANTOPRAZOLE SODIUM 40 MG PO TBEC
40.0000 mg | DELAYED_RELEASE_TABLET | Freq: Two times a day (BID) | ORAL | Status: DC
  Administered 2017-08-27 – 2017-08-28 (×3): 40 mg via ORAL
  Filled 2017-08-27 (×3): qty 1

## 2017-08-27 MED ORDER — POTASSIUM CHLORIDE 2 MEQ/ML IV SOLN
INTRAVENOUS | Status: DC
  Administered 2017-08-27 – 2017-08-28 (×2): via INTRAVENOUS
  Filled 2017-08-27 (×5): qty 1000

## 2017-08-27 MED ORDER — BOOST / RESOURCE BREEZE PO LIQD CUSTOM
1.0000 | Freq: Three times a day (TID) | ORAL | Status: DC
  Administered 2017-08-27 – 2017-08-28 (×2): 1 via ORAL

## 2017-08-27 MED ORDER — MORPHINE SULFATE (PF) 2 MG/ML IV SOLN
2.0000 mg | INTRAVENOUS | Status: DC | PRN
  Administered 2017-08-28 – 2017-08-29 (×3): 2 mg via INTRAVENOUS
  Filled 2017-08-27 (×3): qty 1

## 2017-08-27 MED ORDER — ALBUTEROL SULFATE (2.5 MG/3ML) 0.083% IN NEBU: 2.5000 mg | INHALATION_SOLUTION | Freq: Four times a day (QID) | RESPIRATORY_TRACT | Status: DC | PRN

## 2017-08-27 NOTE — Progress Notes (Signed)
Initial Nutrition Assessment  DOCUMENTATION CODES:   Severe malnutrition in context of chronic illness  INTERVENTION:   Recommend PEG-J vs surgical J-Tube placement and enteral feeds as pt with severe malnutrition and unable to tolerate oral diet.   Pt at high refeeding risk; recommend monitor K, Mg, and P labs.   Recommend folic acid 64m po daily  Recommend B12 1000 mcg po daily   Boost Breeze po TID, each supplement provides 250 kcal and 9 grams of protein  Ensure Enlive po TID, each supplement provides 350 kcal and 20 grams of protein  Magic cup TID with meals, each supplement provides 290 kcal and 9 grams of protein  MVI daily  Vital Cuisine TID, each supplement provides 520kcal and 22g of protein.   NUTRITION DIAGNOSIS:   Severe Malnutrition related to cancer and cancer related treatments as evidenced by severe fat depletion, severe muscle depletion, 22 percent weight loss in <3 months and 33% wt loss in one year.  GOAL:   Patient will meet greater than or equal to 90% of their needs  MONITOR:   PO intake, Supplement acceptance, Labs, Weight trends, Skin, I & O's  REASON FOR ASSESSMENT:   Malnutrition Screening Tool    ASSESSMENT:   57y.o. male with a known history of Stage IV Lung Ca (s/p chemoradiation 2-328mogo) who p/w AP, intractable N/V.   Met with pt in room today. Pt reports poor appetite and oral intake for the past 4 months. Pt with gastroparesis and associated nausea and vomiting. Pt reports that as soon as he eats something, he will just throw it back up. Per chart, pt has lost 50lbs(33%) over the past year and 22lbs(17%) over the past 3 months; this is severe wt loss. Pt has now become malnourished and is at high risk for nutrient deficiencies. Pt noted to have macrocytic anemia and with history of B12 and folate deficiencies. Recommend supplementation. Pt s/p EGD 4/25; found to have gastritis. GI consult pending for possible gastric emptying study.  At this point would recommend PEG-J vs surgical J- tube placement and nutrition support in order for pt to meet his estimated needs. Spoke with attending MD and GI doctor and made them aware of recommendations. GI to see pt today. RD will order supplements and MVI to help pt meet his estimated needs in the interim. Pt at high refeeding risk; recommend monitor K, Mg, and P labs.   Medications reviewed and include: plavix, lovenox, protonix, LRS _0 /hr, morphine  Labs reviewed: Na 130(L), K 3.4(L), Cl 92(L), Ca 8.0(L), Mg 3.4(H) Wbc 2.5(L), Hgb 11.3(L), Hct 31.8(L), MCV 101.8(H), MCH 36.1(H) Folate- 5.9(L)- 5/14 B12- 158(L)- 02/2017  NUTRITION - FOCUSED PHYSICAL EXAM:    Most Recent Value  Orbital Region  Mild depletion  Upper Arm Region  Severe depletion  Thoracic and Lumbar Region  Severe depletion  Buccal Region  Moderate depletion  Temple Region  Moderate depletion  Clavicle Bone Region  Severe depletion  Clavicle and Acromion Bone Region  Severe depletion  Scapular Bone Region  Severe depletion  Dorsal Hand  Severe depletion  Patellar Region  Severe depletion  Anterior Thigh Region  Severe depletion  Posterior Calf Region  Severe depletion  Edema (RD Assessment)  None  Hair  Reviewed  Eyes  Reviewed  Mouth  Reviewed  Skin  Reviewed  Nails  Reviewed     Diet Order:   Diet Order           Diet clear liquid  Room service appropriate? Yes; Fluid consistency: Thin  Diet effective now         EDUCATION NEEDS:   Education needs have been addressed  Skin:  Skin Assessment: Reviewed RN Assessment  Last BM:  5/12  Height:   Ht Readings from Last 1 Encounters:  08/27/17 _0  (1.803 m)    Weight:   Wt Readings from Last 1 Encounters:  08/27/17 104 lb 4.8 oz (47.3 kg)    Ideal Body Weight:  78.1 kg  BMI:  Body mass index is 14.55 kg/m.  Estimated Nutritional Needs:   Kcal:  1850-2150kcal/day   Protein:  71-80g/day   Fluid:  >1.4L/day   Koleen Distance  MS, RD, LDN Pager #- [phone] Office#- [phone] After Hours Pager: [phone]

## 2017-08-27 NOTE — Progress Notes (Signed)
Family Meeting Note  Advance Directive:no  Today a meeting took place with the Patient and spouse.   The following clinical team members were present during this meeting:MD  The following were discussed:Patient's diagnosis: metastatic lung cancer, dehydration, recurrent nausea and vomiting, severe malnutrition , Patient's progosis: Unable to determine and Goals for treatment: Full Code  Additional follow-up to be provided: Oncology  Time spent during discussion:20 minutes  Vaughan Basta, MD

## 2017-08-27 NOTE — Progress Notes (Signed)
[place] [facility] [place] NAME: Philip [name]    MR#:  [id number]  DATE OF BIRTH:  1960-09-17  SUBJECTIVE:  CHIEF COMPLAINT:   Chief Complaint  Patient presents with  . Chest Pain    has chronic complaining of vomiting and nausea and not able to tolerate oral food came with generalized weakness and noted to have electrolyte imbalance.  REVIEW OF SYSTEMS:  CONSTITUTIONAL: No fever, have fatigue or weakness.  EYES: No blurred or double vision.  EARS, NOSE, AND THROAT: No tinnitus or ear pain.  RESPIRATORY: No cough, shortness of breath, wheezing or hemoptysis.  CARDIOVASCULAR: No chest pain, orthopnea, edema.  GASTROINTESTINAL: have nausea, vomiting, no diarrhea or abdominal pain.  GENITOURINARY: No dysuria, hematuria.  ENDOCRINE: No polyuria, nocturia,  HEMATOLOGY: No anemia, easy bruising or bleeding SKIN: No rash or lesion. MUSCULOSKELETAL: No joint pain or arthritis.   NEUROLOGIC: No tingling, numbness, weakness.  PSYCHIATRY: No anxiety or depression.   ROS  DRUG ALLERGIES:   Allergies  Allergen Reactions  . Tylenol [Acetaminophen] Palpitations  . Amitriptyline Other (See Comments)    Burning sensation all over  . Aspirin   . Contrast Media [Iodinated Diagnostic Agents] Other (See Comments)    Pt states that he got "knots behind his ears"  . Nsaids Other (See Comments)    Blood in stools  . Ibuprofen Other (See Comments) and Nausea Only    Blood in stools  . Tape Rash and Other (See Comments)    PLEASE USE COBAN WRAP; THE PATIENT'S SKIN TEARS WHEN BANDAGES ARE REMOVED!!    VITALS:  Blood pressure 106/81, pulse 92, temperature 97.6 F (36.4 C), temperature source Oral, resp. rate 17, height 5\' 11"  (1.803 m), weight 47.3 kg (104 lb 4.8 oz), SpO2 100 %.  PHYSICAL EXAMINATION:  GENERAL:  57 y.o.-year-old thin, malnourished patient lying in the bed with no acute distress.  EYES: Pupils equal, round, reactive to light and  accommodation. No scleral icterus. Extraocular muscles intact.  HEENT: Head atraumatic, normocephalic. Oropharynx and nasopharynx clear.  NECK:  Supple, no jugular venous distention. No thyroid enlargement, no tenderness.  LUNGS: Normal breath sounds bilaterally, no wheezing, rales,rhonchi or crepitation. No use of accessory muscles of respiration.  CARDIOVASCULAR: S1, S2 normal. No murmurs, rubs, or gallops.  ABDOMEN: Soft, nontender, nondistended. Bowel sounds present. No organomegaly or mass.  EXTREMITIES: No pedal edema, cyanosis, or clubbing. Muscle wasting. NEUROLOGIC: Cranial nerves II through XII are intact. Muscle strength 4/5 in all extremities. Sensation intact. Gait not checked.  PSYCHIATRIC: The patient is alert and oriented x 3.  SKIN: No obvious rash, lesion, or ulcer.   Physical Exam LABORATORY PANEL:   CBC Recent Labs  Lab 08/27/17 0425  WBC 2.5*  HGB 11.3*  HCT 31.8*  PLT 180   ------------------------------------------------------------------------------------------------------------------  Chemistries  Recent Labs  Lab 08/26/17 2127 08/27/17 0425  NA 129* 130*  K 2.5* 3.4*  CL 84* 92*  CO2 17* 24  GLUCOSE 85 79  BUN 14 12  CREATININE 1.19 0.97  CALCIUM 8.9 8.0*  MG  --  3.4*  AST 29  --   ALT 15*  --   ALKPHOS 79  --   BILITOT 2.1*  --    ------------------------------------------------------------------------------------------------------------------  Cardiac Enzymes Recent Labs  Lab 08/26/17 2127  TROPONINI <0.03   ------------------------------------------------------------------------------------------------------------------  RADIOLOGY:  Dg Chest Portable 1 View  Result Date: 08/26/2017 CLINICAL DATA:  Nausea vomiting and chest pain EXAM: PORTABLE CHEST 1  VIEW COMPARISON:  PET-CT 04/26/2017, chest x-ray 06/28/2016 FINDINGS: Post sternotomy changes. Right-sided central venous port tip over the distal SVC. Hyperinflation with  emphysematous disease. No acute airspace disease or pleural effusion. Stable cardiomediastinal silhouette with aortic atherosclerosis. No pneumothorax. Bilateral rib sclerosis corresponding to known skeletal metastatic disease IMPRESSION: No active disease.  Hyperinflation with emphysematous disease Electronically Signed   By: Donavan Foil M.D.   On: 08/26/2017 22:30    ASSESSMENT AND PLAN:   Active Problems:   Intractable nausea and vomiting   Protein-calorie malnutrition, severe  * severe malnutrition   Due to recurrent nausea and vomiting   He had been seen by GI and EGD was done which was negative except for gastritis.   He had tried antiemetic medications multiple times in the past and advised by oncologist.   Dietary consult, and he may need to consider jejunal feeding tube.  * hyponatremia   Hypokalemia   Acute renal insufficiency    This is secondary to decreased oral intake and dehydration.   IV fluids, consultGI and oncology to help making further plan to avoid this happening again.  * macrocytosis anemia    This could be secondary to malnutrition   Vitamin supplements, B12 level is normal.  * metastatic lung cancer   Oncology consult to help further management. As per patient he was in remission.  * Recent gastritis   Continue pantoprazole.  * Coronary artery disease   COntinue metoprolol, Plavix.  All the records are reviewed and case discussed with Care Management/Social Workerr. Management plans discussed with the patient, family and they are in agreement.  CODE STATUS: Full.  TOTAL TIME TAKING CARE OF THIS PATIENT: 35 minutes.   Wife in room during my visit.  POSSIBLE D/C IN 1-2 DAYS, DEPENDING ON CLINICAL CONDITION.   Vaughan Basta M.D on 08/27/2017   Between 7am to 6pm - Pager - [phone]  After 6pm go to [email or web address] - password [email or web address] [facility]  Office  [phone]  CC: Primary care physician; Lorelee Market, MD  Note: This dictation was prepared with Dragon dictation along with smaller phrase technology. Any transcriptional errors that result from this process are unintentional.

## 2017-08-27 NOTE — H&P (Addendum)
[facility] NAME: Philip [name]    MR#:  [id number]  DATE OF BIRTH:  10/28/1960  DATE OF ADMISSION:  08/26/2017  PRIMARY CARE PHYSICIAN: Lorelee Market, MD   REQUESTING/REFERRING PHYSICIAN:  Nance Pear, MD     CHIEF COMPLAINT:   Chief Complaint  Patient presents with  . Chest Pain    HISTORY OF PRESENT ILLNESS:  Philip [name]  is a 57 y.o. male with a known history of Stage IV Lung Ca (s/p chemoradiation 2-36mo ago) who p/w AP, intractable N/V. Pt states he was told he is in remission. However, he states that for the past 2-41mo, since the completion of chemoradiation, he has had nausea and vomiting daily, xTNTC. He endorses PO intolerance, vomiting of ingested food and fluids, waxing/waning diffuse crampy abdominal pain worst under the ribs and in the mid-abdomen, weight loss, dehydration, intermittent lightheadeness. He has been unable to keep down his medications. He is thin and chronically-ill appearing. He has  R chest port. He is tachycardic. His mouth is dry. He endorses thirst. He refused a clear liquid diet, and will trial ice chips and sips of water first. Lungs are clear. Abdomen is soft, mildly tender, non-distended, w/ hypoactive bowel sounds. Pt appears cachectic and malnourished. He is in no acute distress at the time of my evaluation. Labwork demonstrates hyponatremia (Na+ 129), hypokalemia (K+ 2.5), Cr elevation/AKI (Cr 1.19, increased from 0.87, ~3wks ago), lactate elevation (2.4, improved to 0.6 w/ IVF), macrocytosis w/o anemia. Admitted for IVF, electrolyte repletion, pain ctrl, symptomatic mgmt.  PAST MEDICAL HISTORY:   Past Medical History:  Diagnosis Date  . Anemia   . CAD (coronary artery disease)    a. 08/2014 Inf STEMI/CABG x 3 (LIMA->LAD, VG->Diag, RIMA->RCA).  . DVT, recurrent, lower extremity, acute, left ([place]) 2016  . GIB (gastrointestinal bleeding)    a. In the setting of  DAPT-->tolerating plavix only.  . Hyperlipidemia   . Hypertensive heart disease   . Lung cancer ([place])    a. s/p chemo/radiation.  Marland Kitchen PAD (peripheral artery disease) ([place])    a. 03/2015 Periph Angio: short occlusion of L pop w/ evidence of embolization into the DP->5.0x50 mm Inova self-expanding stent;  b. 04/08/2015 ABI: R 1.12, L 0.99; c. 01/2017 LE Duplex: bilat SFA obstructive dzs, patent L pop.  . Tobacco abuse   . Toe amputation status, left ([place]) 07/09/2016   2017    PAST SURGICAL HISTORY:   Past Surgical History:  Procedure Laterality Date  . CARDIAC CATHETERIZATION N/A 08/24/2014   Procedure: Left Heart Cath and Coronary Angiography;  Surgeon: Isaias Cowman, MD;  Location: [facility];  Service: Cardiovascular;  Laterality: N/A;  . CARDIAC CATHETERIZATION N/A 08/24/2014   Procedure: Coronary Stent Intervention;  Surgeon: Isaias Cowman, MD;  Location: [facility];  Service: Cardiovascular;  Laterality: N/A;  . CORONARY ARTERY BYPASS GRAFT N/A 08/25/2014   Procedure: CORONARY ARTERY BYPASS GRAFTING (CABG), ON PUMP, TIMES THREE, USING BILATERAL MAMMARY ARTERIES, RIGHT GREATER SAPHENOUS VEIN HARVESTED ENDOSCOPICALLY;  Surgeon: Melrose Nakayama, MD;  Location: [facility];  Service: Open Heart Surgery;  Laterality: N/A;  . ESOPHAGOGASTRODUODENOSCOPY (EGD) WITH PROPOFOL N/A 08/08/2017   Procedure: ESOPHAGOGASTRODUODENOSCOPY (EGD) WITH PROPOFOL;  Surgeon: Jonathon Bellows, MD;  Location: [facility];  Service: Gastroenterology;  Laterality: N/A;  . INCISION AND DRAINAGE ABSCESS Left 11/15/2016   Procedure: INCISION AND DRAINAGE ABSCESS OF LEFT WRIST;  Surgeon: Roseanne Kaufman, MD;  Location: [facility];  Service: Orthopedics;  Laterality: Left;  . PERIPHERAL VASCULAR CATHETERIZATION N/A 03/30/2015   Procedure: Abdominal Aortogram w/Lower Extremity;  Surgeon: Wellington Hampshire, MD;  Location: [facility];  Service: Cardiovascular;  Laterality: N/A;  . PORTA CATH  INSERTION N/A 07/27/2016   Procedure: Glori Luis Cath Insertion;  Surgeon: Algernon Huxley, MD;  Location: [facility];  Service: Cardiovascular;  Laterality: N/A;  . TEE WITHOUT CARDIOVERSION N/A 08/25/2014   Procedure: TRANSESOPHAGEAL ECHOCARDIOGRAM (TEE);  Surgeon: Melrose Nakayama, MD;  Location: [facility];  Service: Open Heart Surgery;  Laterality: N/A;    SOCIAL HISTORY:   Social History   Tobacco Use  . Smoking status: Current Every Day Smoker    Packs/day: 0.50    Types: Cigarettes  . Smokeless tobacco: Never Used  . Tobacco comment: 4cigs day down from 2ppd  Substance Use Topics  . Alcohol use: Yes    Alcohol/week: 0.0 oz    Comment: Sometimes on w/e.    FAMILY HISTORY:   Family History  Problem Relation Age of Onset  . Hypertension Father   . Lung cancer Father   . Heart attack Mother 29       STENT  . Hyperlipidemia Mother   . Heart attack Brother   . Heart attack Brother   . Breast cancer Sister   . Heart attack Sister   . Colon cancer Paternal Grandfather     DRUG ALLERGIES:   Allergies  Allergen Reactions  . Tylenol [Acetaminophen] Palpitations  . Amitriptyline Other (See Comments)    Burning sensation all over  . Aspirin   . Contrast Media [Iodinated Diagnostic Agents] Other (See Comments)    Pt states that he got "knots behind his ears"  . Nsaids Other (See Comments)    Blood in stools  . Ibuprofen Other (See Comments) and Nausea Only    Blood in stools  . Tape Rash and Other (See Comments)    PLEASE USE COBAN WRAP; THE PATIENT'S SKIN TEARS WHEN BANDAGES ARE REMOVED!!    REVIEW OF SYSTEMS:   Review of Systems  Constitutional: Positive for malaise/fatigue and weight loss. Negative for chills, diaphoresis and fever.  HENT: Negative for congestion, ear pain, hearing loss, nosebleeds, sinus pain, sore throat and tinnitus.   Eyes: Negative for blurred vision, double vision and photophobia.  Respiratory: Negative for cough, hemoptysis, sputum  production, shortness of breath and wheezing.   Cardiovascular: Negative for chest pain, palpitations, orthopnea, claudication, leg swelling and PND.  Gastrointestinal: Positive for abdominal pain, nausea and vomiting. Negative for blood in stool, constipation, diarrhea, heartburn and melena.  Genitourinary: Negative for dysuria, flank pain, frequency, hematuria and urgency.  Musculoskeletal: Negative for back pain, joint pain, myalgias and neck pain.  Skin: Negative for itching and rash.  Neurological: Positive for dizziness and weakness. Negative for tingling, tremors, sensory change, speech change, focal weakness, seizures, loss of consciousness and headaches.  Psychiatric/Behavioral: Negative for memory loss. The patient does not have insomnia.    MEDICATIONS AT HOME:   Prior to Admission medications   Medication Sig Start Date End Date Taking? Authorizing Provider  atorvastatin (LIPITOR) 40 MG tablet TAKE 1 TABLET BY MOUTH DAILY 08/02/16  Yes Wellington Hampshire, MD  benzonatate (TESSALON) 100 MG capsule TK 1 C PO TID PRN 03/16/17  Yes [provider]  Calcium Carb-Cholecalciferol (CALCIUM + D3 PO) Take 1 tablet by mouth 3 (three) times daily.   Yes [provider]  clopidogrel (PLAVIX) 75 MG tablet  Take 1 tablet (75 mg total) by mouth daily. 11/14/16  Yes Wellington Hampshire, MD  diphenhydrAMINE (BENADRYL) 25 mg capsule Take 25 mg by mouth 2 (two) times daily as needed for allergies.    Yes [provider]  FLOVENT HFA 220 MCG/ACT inhaler INL 2 PFS PO BID 02/01/17  Yes [provider]  furosemide (LASIX) 20 MG tablet Take 20 mg daily by mouth. 02/21/17  Yes [provider]  gabapentin (NEURONTIN) 300 MG capsule Take 1 capsule (300 mg total) by mouth 3 (three) times daily. 07/05/17  Yes Wellington Hampshire, MD  lidocaine-prilocaine (EMLA) cream Apply to affected area once Patient taking differently: Apply 1 application topically See admin instructions. TO  AFFECTED AREA AS DIRECTED 07/31/16  Yes Corcoran, Drue Second, MD  lisinopril (PRINIVIL,ZESTRIL) 5 MG tablet TAKE 1 TABLET(5 MG) BY MOUTH DAILY 06/19/17  Yes Theora Gianotti, NP  magnesium oxide (MAG-OX) 400 (241.3 Mg) MG tablet Take 1 tablet (400 mg total) by mouth 3 (three) times daily. 05/16/17  Yes Karen Kitchens, NP  metoprolol tartrate (LOPRESSOR) 25 MG tablet TAKE 1 TABLET BY MOUTH TWICE DAILY 12/05/16  Yes Wellington Hampshire, MD  mirtazapine (REMERON) 7.5 MG tablet Take 1 tablet (7.5 mg total) by mouth at bedtime. Use for appetite stimulation. 08/02/17  Yes Karen Kitchens, NP  ondansetron (ZOFRAN-ODT) 8 MG disintegrating tablet Take 1 tablet (8 mg total) by mouth every 8 (eight) hours as needed for nausea or vomiting. 08/02/17  Yes Karen Kitchens, NP  pantoprazole (PROTONIX) 40 MG tablet TK 1 T PO QD 01/18/17  Yes [provider]  potassium chloride SA (K-DUR,KLOR-CON) 20 MEQ tablet Take 1 tablet (20 mEq total) by mouth daily. 08/02/17  Yes Karen Kitchens, NP  PROAIR HFA [phone] Base) MCG/ACT inhaler INL 2 PFS PO Q 4 TO 6 H PRN 02/01/17  Yes [provider]  promethazine (PHENERGAN) 25 MG suppository Place 1 suppository (25 mg total) rectally every 6 (six) hours as needed for nausea or vomiting. 04/11/17  Yes Corcoran, Drue Second, MD  STIOLTO RESPIMAT 2.5-2.5 MCG/ACT AERS INL 2 PFS PO QD 02/04/17  Yes [provider]  traMADol (ULTRAM) 50 MG tablet Take 1 tablet (50 mg total) by mouth every 6 (six) hours as needed. 08/02/17  Yes Karen Kitchens, NP  Vitamin D, Ergocalciferol, (DRISDOL) 50000 units CAPS capsule TK ONE C PO Q WEEK 03/11/17  Yes [provider]  buPROPion (WELLBUTRIN XL) 150 MG 24 hr tablet TK 1 T PO QD 01/18/17   [provider]      VITAL SIGNS:  Blood pressure (!) 145/94, pulse (!) 110, temperature 98.4 F (36.9 C), resp. rate 18, height 5\' 11"  (1.803 m), weight 47.3 kg (104 lb 4.8 oz), SpO2 (!) 89 %.  PHYSICAL EXAMINATION:  Physical  Exam  Constitutional: He is oriented to person, place, and time. He appears well-developed. He appears cachectic. He is active and cooperative.  Non-toxic appearance. He has a sickly appearance. He appears ill. No distress.  HENT:  Head: Normocephalic and atraumatic.  Mouth/Throat: Oropharynx is clear and moist. No oropharyngeal exudate.  Eyes: Conjunctivae, EOM and lids are normal. No scleral icterus.  Neck: Neck supple. No JVD present. No edema present. No thyromegaly present.  Cardiovascular: Regular rhythm, S1 normal, S2 normal and normal heart sounds.  No extrasystoles are present. Tachycardia present. Exam reveals no gallop, no S3, no S4, no distant heart sounds and no friction rub.  No  murmur heard. Pulmonary/Chest: Effort normal. No accessory muscle usage or stridor. No apnea, no tachypnea and no bradypnea. No respiratory distress. He has decreased breath sounds in the right upper field, the right middle field, the right lower field, the left upper field, the left middle field and the left lower field. He has no wheezes. He has no rhonchi. He has no rales.  Abdominal: Soft. He exhibits no distension. There is generalized tenderness and tenderness in the periumbilical area. There is no rebound and no guarding.  Musculoskeletal: He exhibits tenderness. He exhibits no edema.       Right lower leg: Normal. He exhibits tenderness. He exhibits no edema.       Left lower leg: Normal. He exhibits tenderness. He exhibits no edema.  Lymphadenopathy:    He has no cervical adenopathy.  Neurological: He is alert and oriented to person, place, and time. He is not disoriented.  Skin: Skin is warm, dry and intact. No rash noted. He is not diaphoretic. No erythema. There is pallor.  Psychiatric: He has a normal mood and affect. His speech is normal and behavior is normal. Judgment and thought content normal. His mood appears not anxious. He is not agitated. Cognition and memory are normal.   LABORATORY  PANEL:   CBC Recent Labs  Lab 08/26/17 2127  WBC 4.5  HGB 13.9  HCT 38.6*  PLT 232   ------------------------------------------------------------------------------------------------------------------  Chemistries  Recent Labs  Lab 08/26/17 2127  NA 129*  K 2.5*  CL 84*  CO2 17*  GLUCOSE 85  BUN 14  CREATININE 1.19  CALCIUM 8.9  AST 29  ALT 15*  ALKPHOS 79  BILITOT 2.1*   ------------------------------------------------------------------------------------------------------------------  Cardiac Enzymes Recent Labs  Lab 08/26/17 2127  TROPONINI <0.03   ------------------------------------------------------------------------------------------------------------------  RADIOLOGY:  Dg Chest Portable 1 View  Result Date: 08/26/2017 CLINICAL DATA:  Nausea vomiting and chest pain EXAM: PORTABLE CHEST 1 VIEW COMPARISON:  PET-CT 04/26/2017, chest x-ray 06/28/2016 FINDINGS: Post sternotomy changes. Right-sided central venous port tip over the distal SVC. Hyperinflation with emphysematous disease. No acute airspace disease or pleural effusion. Stable cardiomediastinal silhouette with aortic atherosclerosis. No pneumothorax. Bilateral rib sclerosis corresponding to known skeletal metastatic disease IMPRESSION: No active disease.  Hyperinflation with emphysematous disease Electronically Signed   By: Donavan Foil M.D.   On: 08/26/2017 22:30   IMPRESSION AND PLAN:   A/P: 60M AP, intractable N/V, dehydration, hypovolemic hyponatremia, hypokalemia, AKI (likely prerenal), macrocytosis w/o anemia, lactate elevation, hypoalbuminemia. -Pain ctrl, symptomatic mgmt w/ Zofran + Phenergan -IVF LR w/ K+. EF 45-50% w/ grade 1 DD (as of 07/27/2016 Echo). -NPO per pt request, ice chips/sips -Replete K+ (IV) -Magnesium level pending -Monitor BMP -Cr up by 0.3, (+) AKI, likely prerenal 2/2 N/V w/ hypovolemic hypochloremic hyponatremia -Hold Lasix + Lisinopril -Avoid nephrotoxins -Urine  electrolytes, renal U/S if no improvement in Cr -B12 + Folate levels pending -Lactate improved w/ IVF, likely 2/2 dehydration -Tachycardia likely 2/2 dehydration, electrolyte abnl, beta blocker withdrawal. Mgmt as above. Restart Lopressor. -Hypoalbuminemia likely 2/2 protein calorie malnutrition, pt cachectic. Prealbumin pending. Not likely any purpose to Nutrition consult until pt tolerating PO. -Speaking of which, intractable N/V is suspected to be 2/2 gastroparesis, etiology chemoradiation vs. paraneoplastic syndrome. I do not know if there is value in using Reglan or Erythromycin in this setting. GI consult appreciated, pt may benefit from gastric emptying study and gastric pacemaker if medical mgmt not indicated. -c/w Plavix & Protonix. Hold other PO meds. -Lovenox -Full code -  Admission, > 2 midnights   All the records are reviewed and case discussed with ED provider. Management plans discussed with the patient, family and they are in agreement.  CODE STATUS: Full code  TOTAL TIME TAKING CARE OF THIS PATIENT: 60 minutes.    Arta Silence M.D on 08/27/2017 at 1:49 AM  Between 7am to 6pm - Pager - [phone]  After 6pm go to [email or web address] - Proofreader  [facility]  Office  [phone]  CC: Primary care physician; Lorelee Market, MD   Note: This dictation was prepared with Dragon dictation along with smaller phrase technology. Any transcriptional errors that result from this process are unintentional.

## 2017-08-27 NOTE — Evaluation (Signed)
Physical Therapy Evaluation Patient Details Name: Philip [name] MRN: [id number] DOB: [birth year] Today's Date: 08/27/2017   History of Present Illness  presented to ER secondary to nausea/vomiting; admitted for management of intractable nausea/vomiting, dehydration and AKI  Clinical Impression  Upon evaluation, patient alert and oriented; follows all commands and demonstrates good effort/insight into mobility tasks.  Bilat UE/LE generally weak and deconditioned (due to chronic illness), but [facility] for basic transfers and gait.  Able to complete bed mobility, sit/stand, basic transfers and gait (160') with RW, close sup/mod indep.  Slow cadence, overall gait performance, but no buckling, LOB or safety concern.  Patient reporting performance feels at/near baseline ("actually a little better than normal").  Do recommend continued use of RW for optimal safety/indep and overall energy conservation. Patient voiced understanding. As patient appears to be at/near baseline, no acute indication for skilled PT services appreciated.  Will complete initial order at this time.  Please re-consult should needs or goals of care change.   Do encourage continued, progressive mobility with RW around unit with family/nursing staff as appropriate to ensure maintenance of functional strength/endurance necessary to facilitate safe discharge home.    Follow Up Recommendations No PT follow up    Equipment Recommendations  (has necessary equipment)    Recommendations for Other Services       Precautions / Restrictions Precautions Precautions: Fall Restrictions Weight Bearing Restrictions: No      Mobility  Bed Mobility Overal bed mobility: Modified Independent                Transfers Overall transfer level: Modified independent Equipment used: Rolling walker (2 wheeled)             General transfer comment: sit/stand with RW, good hand placement, balance and overall  control  Ambulation/Gait Ambulation/Gait assistance: Supervision;Modified independent (Device/Increase time) Ambulation Distance (Feet): 160 Feet Assistive device: Rolling walker (2 wheeled)       General Gait Details: reciprocal stepping pattern; slow, but steady, cadence and overall gait performance.  No bucklig, LOB; feels performance is baseline for him ("actually feels a little bit better than normal")  Stairs            Wheelchair Mobility    Modified Rankin (Stroke Patients Only)       Balance Overall balance assessment: Needs assistance Sitting-balance support: No upper extremity supported;Feet supported Sitting balance-Leahy Scale: Normal     Standing balance support: Bilateral upper extremity supported Standing balance-Leahy Scale: Fair                               Pertinent Vitals/Pain Pain Assessment: No/denies pain    Home Living Family/patient expects to be discharged to:: Private residence Living Arrangements: Spouse/significant other Available Help at Discharge: Family Type of Home: Mobile home Home Access: Stairs to enter Entrance Stairs-Rails: Right;Left;Can reach both Entrance Stairs-Number of Steps: 8 Home Layout: One level Home Equipment: [place] - 4 wheels;Cane - single point      Prior Function Level of Independence: Independent with assistive device(s)         Comments: Mod indep with intermittent use of RW/SPC (depending on "how my legs feel") for ADLs, household distances.  Wife available to assist with household chores as needed.  Does endorse at leat 3-4 falls within previous six months, most recent approx 3 weeks prior to admission (related to "getting dizzy and feeling like I was going to pass out")  Hand Dominance        Extremity/Trunk Assessment   Upper Extremity Assessment Upper Extremity Assessment: Generalized weakness    Lower Extremity Assessment Lower Extremity Assessment: Generalized  weakness(grossly 4-/5 throughout)       Communication   Communication: No difficulties  Cognition Arousal/Alertness: Awake/alert Behavior During Therapy: WFL for tasks assessed/performed Overall Cognitive Status: Within Functional Limits for tasks assessed                                        General Comments      Exercises     Assessment/Plan    PT Assessment Patent does not need any further PT services  PT Problem List         PT Treatment Interventions      PT Goals (Current goals can be found in the Care Plan section)  Acute Rehab PT Goals Patient Stated Goal: to get stronger PT Goal Formulation: All assessment and education complete, DC therapy Time For Goal Achievement: 08/27/17 Potential to Achieve Goals: Good    Frequency     Barriers to discharge        Co-evaluation               AM-PAC PT "6 Clicks" Daily Activity  Outcome Measure Difficulty turning over in bed (including adjusting bedclothes, sheets and blankets)?: None Difficulty moving from lying on back to sitting on the side of the bed? : None Difficulty sitting down on and standing up from a chair with arms (e.g., wheelchair, bedside commode, etc,.)?: None Help needed moving to and from a bed to chair (including a wheelchair)?: None Help needed walking in hospital room?: A Little Help needed climbing 3-5 steps with a railing? : A Little 6 Click Score: 22    End of Session Equipment Utilized During Treatment: Gait belt Activity Tolerance: Patient tolerated treatment well Patient left: in bed;with call bell/phone within reach(CNA to activate bed alarm upon return to room) Nurse Communication: Mobility status PT Visit Diagnosis: Muscle weakness (generalized) (M62.81);Difficulty in walking, not elsewhere classified (R26.2)    Time: 7948-0165 PT Time Calculation (min) (ACUTE ONLY): 19 min   Charges:   PT Evaluation $PT Eval Low Complexity: 1 Low     PT G Codes:         Cuthbert Turton H. Owens Shark, PT, DPT, NCS 08/27/17, 10:52 AM [phone]

## 2017-08-27 NOTE — Consult Note (Addendum)
Cephas Darby, MD [address]  [place], [place] [address]  Main: [phone]  Fax: [phone] Pager: [phone]   Consultation  Referring Provider:     No ref. provider found Primary Care Physician:  Lorelee Market, MD Primary Gastroenterologist:  Dr.Wohl       Reason for Consultation:     Intractable nausea, unintentional weight loss, failure to thrive  Date of Admission:  08/26/2017 Date of Consultation:  08/27/2017         HPI:   Philip [name] is a 57 y.o. male with history of stage IV lung cancer, status post chemotherapy and radiation. He reports that since completion of radiation therapy, which is approximately 6 months ago, he started experiencing severe nausea and emesis which limited his oral intake. He also developed severe anorexia. He is started on Remeron by his oncologist. He also reports ongoing subcostal bilateral chest pain which he thinks is probably contributing to his nausea and limiting his by mouth intake. He reports to me that this and his chest pain is under control, he might be able to tolerate something by mouth. He is referred to pain management as outpatient by Dr. Mike Gip on but hasn't seen them yet. He has been taking Zofran and Phenergan suppository at home as needed. He reports that he lost about 90 pounds since he was diagnosed with cancer and more significantly within last 6 months. He had several occasions. He was dehydrated and presyncopal episodes and he was receiving IV fluids at the cancer center. There was some discussion about PEG-J 2 placement because of failure to thrive and ongoing weight loss. He was initially seen by Dr. Allen Norris on 07/29/2017 as outpatient and underwent upper endoscopy with Dr. Vicente Males on 08/08/2017. This was unremarkable and no evidence of H. Pylori infection.  He is admitted yesterday secondary to dry heaving, dehydration, bilateral chest pain and intractable nausea. GI is consulted for further  evaluation and possible PEG J-tube placement  NSAIDs: none  Antiplts/Anticoagulants/Anti thrombotics:  Plavix secondary colon artery disease  GI Procedures: EGD by Dr. Vicente Males on 08/08/2017 Erythematous stomach, biopsied DIAGNOSIS:  A. STOMACH, ANTRUM AND BODY; COLD BIOPSY:  - ANTRAL MUCOSA WITH REACTIVE GASTRITIS.  - NEGATIVE FOR H. PYLORI, DYSPLASIA, AND MALIGNANCY.  Past Medical History:  Diagnosis Date  . Anemia   . CAD (coronary artery disease)    a. 08/2014 Inf STEMI/CABG x 3 (LIMA->LAD, VG->Diag, RIMA->RCA).  . DVT, recurrent, lower extremity, acute, left ([place]) 2016  . GIB (gastrointestinal bleeding)    a. In the setting of DAPT-->tolerating plavix only.  . Hyperlipidemia   . Hypertensive heart disease   . Lung cancer ([place])    a. s/p chemo/radiation.  Marland Kitchen PAD (peripheral artery disease) ([place] [place])    a. 03/2015 Periph Angio: short occlusion of L pop w/ evidence of embolization into the DP->5.0x50 mm Inova self-expanding stent;  b. 04/08/2015 ABI: R 1.12, L 0.99; c. 01/2017 LE Duplex: bilat SFA obstructive dzs, patent L pop.  . Tobacco abuse   . Toe amputation status, left [facility]) 07/09/2016   2017    Past Surgical History:  Procedure Laterality Date  . CARDIAC CATHETERIZATION N/A 08/24/2014   Procedure: Left Heart Cath and Coronary Angiography;  Surgeon: Isaias Cowman, MD;  Location: [facility];  Service: Cardiovascular;  Laterality: N/A;  . CARDIAC CATHETERIZATION N/A 08/24/2014   Procedure: Coronary Stent Intervention;  Surgeon: Isaias Cowman, MD;  Location: [place] [facility];  Service:  Cardiovascular;  Laterality: N/A;  . CORONARY ARTERY BYPASS GRAFT N/A 08/25/2014   Procedure: CORONARY ARTERY BYPASS GRAFTING (CABG), ON PUMP, TIMES THREE, USING BILATERAL MAMMARY ARTERIES, RIGHT GREATER SAPHENOUS VEIN HARVESTED ENDOSCOPICALLY;  Surgeon: Melrose Nakayama, MD;  Location: [facility];  Service: Open Heart Surgery;  Laterality: N/A;  . ESOPHAGOGASTRODUODENOSCOPY  (EGD) WITH PROPOFOL N/A 08/08/2017   Procedure: ESOPHAGOGASTRODUODENOSCOPY (EGD) WITH PROPOFOL;  Surgeon: Jonathon Bellows, MD;  Location: [facility] ENDOSCOPY;  Service: Gastroenterology;  Laterality: N/A;  . INCISION AND DRAINAGE ABSCESS Left 11/15/2016   Procedure: INCISION AND DRAINAGE ABSCESS OF LEFT WRIST;  Surgeon: Roseanne Kaufman, MD;  Location: [facility];  Service: Orthopedics;  Laterality: Left;  . PERIPHERAL VASCULAR CATHETERIZATION N/A 03/30/2015   Procedure: Abdominal Aortogram w/Lower Extremity;  Surgeon: Wellington Hampshire, MD;  Location: [facility];  Service: Cardiovascular;  Laterality: N/A;  . PORTA CATH INSERTION N/A 07/27/2016   Procedure: Glori Luis Cath Insertion;  Surgeon: Algernon Huxley, MD;  Location: [facility];  Service: Cardiovascular;  Laterality: N/A;  . TEE WITHOUT CARDIOVERSION N/A 08/25/2014   Procedure: TRANSESOPHAGEAL ECHOCARDIOGRAM (TEE);  Surgeon: Melrose Nakayama, MD;  Location: [place] [facility];  Service: Open Heart Surgery;  Laterality: N/A;    Prior to Admission medications   Medication Sig Start Date End Date Taking? Authorizing Provider  atorvastatin (LIPITOR) 40 MG tablet TAKE 1 TABLET BY MOUTH DAILY 08/02/16  Yes Wellington Hampshire, MD  benzonatate (TESSALON) 100 MG capsule TK 1 C PO TID PRN 03/16/17  Yes [provider]  Calcium Carb-Cholecalciferol (CALCIUM + D3 PO) Take 1 tablet by mouth 3 (three) times daily.   Yes [provider]  clopidogrel (PLAVIX) 75 MG tablet Take 1 tablet (75 mg total) by mouth daily. 11/14/16  Yes Wellington Hampshire, MD  diphenhydrAMINE (BENADRYL) 25 mg capsule Take 25 mg by mouth 2 (two) times daily as needed for allergies.    Yes [provider]  FLOVENT HFA 220 MCG/ACT inhaler INL 2 PFS PO BID 02/01/17  Yes [provider]  furosemide (LASIX) 20 MG tablet Take 20 mg daily by mouth. 02/21/17  Yes [provider]  gabapentin (NEURONTIN) 300 MG capsule Take 1 capsule (300 mg total) by mouth 3 (three)  times daily. 07/05/17  Yes Wellington Hampshire, MD  lidocaine-prilocaine (EMLA) cream Apply to affected area once Patient taking differently: Apply 1 application topically See admin instructions. TO AFFECTED AREA AS DIRECTED 07/31/16  Yes Corcoran, Drue Second, MD  lisinopril (PRINIVIL,ZESTRIL) 5 MG tablet TAKE 1 TABLET(5 MG) BY MOUTH DAILY 06/19/17  Yes Theora Gianotti, NP  magnesium oxide (MAG-OX) 400 (241.3 Mg) MG tablet Take 1 tablet (400 mg total) by mouth 3 (three) times daily. 05/16/17  Yes Karen Kitchens, NP  metoprolol tartrate (LOPRESSOR) 25 MG tablet TAKE 1 TABLET BY MOUTH TWICE DAILY 12/05/16  Yes Wellington Hampshire, MD  mirtazapine (REMERON) 7.5 MG tablet Take 1 tablet (7.5 mg total) by mouth at bedtime. Use for appetite stimulation. 08/02/17  Yes Karen Kitchens, NP  ondansetron (ZOFRAN-ODT) 8 MG disintegrating tablet Take 1 tablet (8 mg total) by mouth every 8 (eight) hours as needed for nausea or vomiting. 08/02/17  Yes Karen Kitchens, NP  pantoprazole (PROTONIX) 40 MG tablet TK 1 T PO QD 01/18/17  Yes [provider]  potassium chloride SA (K-DUR,KLOR-CON) 20 MEQ tablet Take 1 tablet (20 mEq total) by mouth daily. 08/02/17  Yes Karen Kitchens, NP  PROAIR HFA 108 (  90 Base) MCG/ACT inhaler INL 2 PFS PO Q 4 TO 6 H PRN 02/01/17  Yes [provider]  promethazine (PHENERGAN) 25 MG suppository Place 1 suppository (25 mg total) rectally every 6 (six) hours as needed for nausea or vomiting. 04/11/17  Yes Corcoran, Drue Second, MD  STIOLTO RESPIMAT 2.5-2.5 MCG/ACT AERS INL 2 PFS PO QD 02/04/17  Yes [provider]  traMADol (ULTRAM) 50 MG tablet Take 1 tablet (50 mg total) by mouth every 6 (six) hours as needed. 08/02/17  Yes Karen Kitchens, NP  Vitamin D, Ergocalciferol, (DRISDOL) 50000 units CAPS capsule TK ONE C PO Q WEEK 03/11/17  Yes [provider]  buPROPion (WELLBUTRIN XL) 150 MG 24 hr tablet TK 1 T PO QD 01/18/17   [provider]    Family History    Problem Relation Age of Onset  . Hypertension Father   . Lung cancer Father   . Heart attack Mother 36       STENT  . Hyperlipidemia Mother   . Heart attack Brother   . Heart attack Brother   . Breast cancer Sister   . Heart attack Sister   . Colon cancer Paternal Grandfather      Social History   Tobacco Use  . Smoking status: Current Every Day Smoker    Packs/day: 0.50    Types: Cigarettes  . Smokeless tobacco: Never Used  . Tobacco comment: 4cigs day down from 2ppd  Substance Use Topics  . Alcohol use: Yes    Alcohol/week: 0.0 oz    Comment: Sometimes on w/e.  . Drug use: No    Allergies as of 08/26/2017 - Review Complete 08/08/2017  Allergen Reaction Noted  . Tylenol [acetaminophen] Palpitations 05/16/2017  . Amitriptyline Other (See Comments) 03/21/2016  . Aspirin  06/21/2017  . Contrast media [iodinated diagnostic agents] Other (See Comments) 07/01/2014  . Nsaids Other (See Comments) 08/24/2014  . Ibuprofen Other (See Comments) and Nausea Only 07/06/2014  . Tape Rash and Other (See Comments) 11/15/2016    Review of Systems:    All systems reviewed and negative except where noted in HPI.   Physical Exam:  Vital signs in last 24 hours: Temp:  [97.6 F (36.4 C)-98.7 F (37.1 C)] 97.6 F (36.4 C) (05/14 0751) Pulse Rate:  [70-145] 70 (05/14 1620) Resp:  [17-25] 17 (05/14 0409) BP: (91-146)/(62-110) 115/62 (05/14 1620) SpO2:  [89 %-100 %] 96 % (05/14 1620) Weight:  [104 lb 4.8 oz (47.3 kg)-116 lb (52.6 kg)] 104 lb 4.8 oz (47.3 kg) (05/14 0138) Last BM Date: 08/25/17 General:   Ill-appearing, cachectic, cooperative in NAD Head:  Normocephalic and atraumatic, bitemporal wasting. Eyes:   No icterus.   Conjunctiva pink. PERRLA. Ears:  Normal auditory acuity. Neck:  Supple; no masses or thyroidomegaly Lungs: Respirations even and unlabored. Lungs clear to auscultation bilaterally.   No wheezes, crackles, or rhonchi.  Heart:  Regular rate and rhythm;  Without  murmur, clicks, rubs or gallops Abdomen:  Soft, scaphoid, nondistended, nontender. Normal bowel sounds. No appreciable masses or hepatomegaly.  No rebound or guarding.  Rectal:  Not performed. Msk: he has tenderness in bilateral subcostal area, Symmetrical without gross deformities. Extremities:  Without edema, cyanosis or clubbing. Neurologic:  Alert and oriented x3;  grossly normal neurologically. Psych:  Alert and cooperative. Normal affect.  LAB RESULTS: CBC Latest Ref Rng & Units 08/27/2017 08/26/2017 08/02/2017  WBC 3.8 - 10.6 K/uL 2.5(L) 4.5 3.6(L)  Hemoglobin 13.0 - 18.0 g/dL 11.3(L)  13.9 11.9(L)  Hematocrit 40.0 - 52.0 % 31.8(L) 38.6(L) 33.4(L)  Platelets 150 - 440 K/uL 180 232 194    BMET BMP Latest Ref Rng & Units 08/27/2017 08/26/2017 08/02/2017  Glucose 65 - 99 mg/dL 79 85 108(H)  BUN 6 - 20 mg/dL _0 Creatinine 0.61 - 1.24 mg/dL 0.97 1.19 0.87  BUN/Creat Ratio 9 - 20 - - -  Sodium 135 - 145 mmol/L 130(L) 129(L) 124(L)  Potassium 3.5 - 5.1 mmol/L 3.4(L) 2.5(LL) 2.8(L)  Chloride 101 - 111 mmol/L 92(L) 84(L) 86(L)  CO2 22 - 32 mmol/L 24 17(L) 27  Calcium 8.9 - 10.3 mg/dL 8.0(L) 8.9 8.6(L)    LFT Hepatic Function Latest Ref Rng & Units 08/26/2017 08/02/2017 06/28/2017  Total Protein 6.5 - 8.1 g/dL 6.4(L) 5.7(L) -  Albumin 3.5 - 5.0 g/dL 3.1(L) 2.7(L) 2.5(L)  AST 15 - 41 U/L 29 28 -  ALT 17 - 63 U/L 15(L) 22 -  Alk Phosphatase 38 - 126 U/L 79 78 -  Total Bilirubin 0.3 - 1.2 mg/dL 2.1(H) 0.6 -  Bilirubin, Direct 0.1 - 0.5 mg/dL - - -     STUDIES: Dg Chest Portable 1 View  Result Date: 08/26/2017 CLINICAL DATA:  Nausea vomiting and chest pain EXAM: PORTABLE CHEST 1 VIEW COMPARISON:  PET-CT 04/26/2017, chest x-ray 06/28/2016 FINDINGS: Post sternotomy changes. Right-sided central venous port tip over the distal SVC. Hyperinflation with emphysematous disease. No acute airspace disease or pleural effusion. Stable cardiomediastinal silhouette with aortic atherosclerosis. No  pneumothorax. Bilateral rib sclerosis corresponding to known skeletal metastatic disease IMPRESSION: No active disease.  Hyperinflation with emphysematous disease Electronically Signed   By: Donavan Foil M.D.   On: 08/26/2017 22:30      Impression / Plan:   Philip [name] is a 57 y.o. male with stage IV lung cancer,status post radiation and chemotherapy with approximately six-month history of bilateral subcostal chest pain associated with intractable nausea, poor by mouth intake resulting in failure to thrive. I suspect if his bilateral chest pain is secondary to radiation that may have resulted in some scarring/fibrosis of intrathoracic cavity  - recommend adequate pain control - Zofran 4 mg alternate with Phenergan 25 mg every 6 hours - start Zyprexa 10 mg at bedtime - Start Reglan 5 mg before each meal and at bedtime - increase Protonix to 40 mg 2 times daily - Empiric trial of prednisone 27m daily for intractable nausea - popsicles every 4-6 hours - Clear liquid diet - Recommend placement of dobhoff by radiology under fluoroscopy for postpyloric tube feeds  - We will assess response to the above regimen in controlling his nausea before  proceeding with G-J placement  Thank you for involving me in the care of this patient.  Will follow along with you    LOS: 0 days   RSherri Sear MD  08/27/2017, 5:06 PM   Note: This dictation was prepared with Dragon dictation along with smaller phrase technology. Any transcriptional errors that result from this process are unintentional.

## 2017-08-27 NOTE — Consult Note (Signed)
[facility]  Date of admission:  08/27/2017  Inpatient day:  08/27/2017  Consulting physician:  Dr. Vaughan Basta.  Reason for Consultation:  Persistent nausea and vomiting.  Chief Complaint: Philip [name] is a 57 y.o. male with extensive stage small cell lung cancer who was admitted through the emergency room with nausea, vomiting, dehydration and electrolyte abnormalities.  HPI:  The patiient was diagnosed with extensive small cell lung cancer in 07/2016.  He received 4 cycles of carboplatin and etoposide (last 10/02/2016).  He completed thoracic spine radiation in 12/2016.  He completed prophylactic cranial radiation in 02/2017. PET scan on 04/26/2017 revealed scattered sclerotic non-hypermetabolic bone lesions.  He was last been by me in the medical oncology clinic on 06/13/2017.  At that time, he complained of nausea and vomiting. He underwent UGI on 06/14/2017.  There was no evidence of obstruction or gastroparesis.  He was referred to GI.  Head CT without contrast on 06/21/2017 revealed no evidence of infarction, hemorrhage, or mass effect.  He was seen by Dr. Allen Norris on 07/29/2017.  EGD on 08/08/2017 revealed gastritis.  He was seen by Overton Mam, NP in the medical oncology clinic on 08/02/2017.  Nausea was not controlled with antiemetics.He was started on a trial of mirtazapine.  He presented to [facility] ER on 08/26/2017 with nausea, vomiting, and abdominal pain.  He described multiple episodes of nausea and vomiting.  He has continued to lose weight.  He states that he want to eat, but soon after getting the food in his mouth, he has a strong gag reflex and it comes right back up.  He denied and diarrhea, constipation, melena or hematochezia.  He notes some discomfort under his ribs.  He also notes pain in his legs from his known peripheral vascular disease.   Past Medical History:  Diagnosis Date  . Anemia   . CAD (coronary artery disease)    a.  08/2014 Inf STEMI/CABG x 3 (LIMA->LAD, VG->Diag, RIMA->RCA).  . DVT, recurrent, lower extremity, acute, left ([place]) 2016  . GIB (gastrointestinal bleeding)    a. In the setting of DAPT-->tolerating plavix only.  . Hyperlipidemia   . Hypertensive heart disease   . Lung cancer ([place])    a. s/p chemo/radiation.  Marland Kitchen PAD (peripheral artery disease) ([place])    a. 03/2015 Periph Angio: short occlusion of L pop w/ evidence of embolization into the DP->5.0x50 mm Inova self-expanding stent;  b. 04/08/2015 ABI: R 1.12, L 0.99; c. 01/2017 LE Duplex: bilat SFA obstructive dzs, patent L pop.  . Tobacco abuse   . Toe amputation status, left [facility]) 07/09/2016   2017    Past Surgical History:  Procedure Laterality Date  . CARDIAC CATHETERIZATION N/A 08/24/2014   Procedure: Left Heart Cath and Coronary Angiography;  Surgeon: Isaias Cowman, MD;  Location: [place] [facility];  Service: Cardiovascular;  Laterality: N/A;  . CARDIAC CATHETERIZATION N/A 08/24/2014   Procedure: Coronary Stent Intervention;  Surgeon: Isaias Cowman, MD;  Location: [place] [facility];  Service: Cardiovascular;  Laterality: N/A;  . CORONARY ARTERY BYPASS GRAFT N/A 08/25/2014   Procedure: CORONARY ARTERY BYPASS GRAFTING (CABG), ON PUMP, TIMES THREE, USING BILATERAL MAMMARY ARTERIES, RIGHT GREATER SAPHENOUS VEIN HARVESTED ENDOSCOPICALLY;  Surgeon: Melrose Nakayama, MD;  Location: [place];  Service: Open Heart Surgery;  Laterality: N/A;  . ESOPHAGOGASTRODUODENOSCOPY (EGD) WITH PROPOFOL N/A 08/08/2017   Procedure: ESOPHAGOGASTRODUODENOSCOPY (EGD) WITH PROPOFOL;  Surgeon: Jonathon Bellows, MD;  Location: [facility] ENDOSCOPY;  Service: Gastroenterology;  Laterality:  N/A;  . INCISION AND DRAINAGE ABSCESS Left 11/15/2016   Procedure: INCISION AND DRAINAGE ABSCESS OF LEFT WRIST;  Surgeon: Roseanne Kaufman, MD;  Location: [place];  Service: Orthopedics;  Laterality: Left;  . PERIPHERAL VASCULAR CATHETERIZATION N/A 03/30/2015   Procedure: Abdominal  Aortogram w/Lower Extremity;  Surgeon: Wellington Hampshire, MD;  Location: [place] [facility];  Service: Cardiovascular;  Laterality: N/A;  . PORTA CATH INSERTION N/A 07/27/2016   Procedure: Glori Luis Cath Insertion;  Surgeon: Algernon Huxley, MD;  Location: [place] [facility];  Service: Cardiovascular;  Laterality: N/A;  . TEE WITHOUT CARDIOVERSION N/A 08/25/2014   Procedure: TRANSESOPHAGEAL ECHOCARDIOGRAM (TEE);  Surgeon: Melrose Nakayama, MD;  Location: [place];  Service: Open Heart Surgery;  Laterality: N/A;    Family History  Problem Relation Age of Onset  . Hypertension Father   . Lung cancer Father   . Heart attack Mother 37       STENT  . Hyperlipidemia Mother   . Heart attack Brother   . Heart attack Brother   . Breast cancer Sister   . Heart attack Sister   . Colon cancer Paternal Grandfather     Social History:  reports that he has been smoking cigarettes.  He has been smoking about 0.50 packs per day. He has never used smokeless tobacco. He reports that he drinks alcohol. He reports that he does not use drugs.  The patient is accompanied by his wife.  Allergies:  Allergies  Allergen Reactions  . Tylenol [Acetaminophen] Palpitations  . Amitriptyline Other (See Comments)    Burning sensation all over  . Aspirin   . Contrast Media [Iodinated Diagnostic Agents] Other (See Comments)    Pt states that he got "knots behind his ears"  . Nsaids Other (See Comments)    Blood in stools  . Ibuprofen Other (See Comments) and Nausea Only    Blood in stools  . Tape Rash and Other (See Comments)    PLEASE USE COBAN WRAP; THE PATIENT'S SKIN TEARS WHEN BANDAGES ARE REMOVED!!    Medications Prior to Admission  Medication Sig Dispense Refill  . atorvastatin (LIPITOR) 40 MG tablet TAKE 1 TABLET BY MOUTH DAILY 90 tablet 3  . benzonatate (TESSALON) 100 MG capsule TK 1 C PO TID PRN  2  . Calcium Carb-Cholecalciferol (CALCIUM + D3 PO) Take 1 tablet by mouth 3 (three) times daily.    .  clopidogrel (PLAVIX) 75 MG tablet Take 1 tablet (75 mg total) by mouth daily. 90 tablet 3  . diphenhydrAMINE (BENADRYL) 25 mg capsule Take 25 mg by mouth 2 (two) times daily as needed for allergies.     Marland Kitchen FLOVENT HFA 220 MCG/ACT inhaler INL 2 PFS PO BID  5  . furosemide (LASIX) 20 MG tablet Take 20 mg daily by mouth.  1  . gabapentin (NEURONTIN) 300 MG capsule Take 1 capsule (300 mg total) by mouth 3 (three) times daily. 90 capsule 3  . lidocaine-prilocaine (EMLA) cream Apply to affected area once (Patient taking differently: Apply 1 application topically See admin instructions. TO AFFECTED AREA AS DIRECTED) 30 g 3  . lisinopril (PRINIVIL,ZESTRIL) 5 MG tablet TAKE 1 TABLET(5 MG) BY MOUTH DAILY 90 tablet 3  . magnesium oxide (MAG-OX) 400 (241.3 Mg) MG tablet Take 1 tablet (400 mg total) by mouth 3 (three) times daily. 90 tablet 3  . metoprolol tartrate (LOPRESSOR) 25 MG tablet TAKE 1 TABLET BY MOUTH TWICE DAILY 180 tablet 3  . mirtazapine (REMERON)  7.5 MG tablet Take 1 tablet (7.5 mg total) by mouth at bedtime. Use for appetite stimulation. 30 tablet 0  . ondansetron (ZOFRAN-ODT) 8 MG disintegrating tablet Take 1 tablet (8 mg total) by mouth every 8 (eight) hours as needed for nausea or vomiting. 30 tablet 1  . pantoprazole (PROTONIX) 40 MG tablet TK 1 T PO QD  5  . potassium chloride SA (K-DUR,KLOR-CON) 20 MEQ tablet Take 1 tablet (20 mEq total) by mouth daily. 90 tablet 0  . PROAIR HFA 108 (90 Base) MCG/ACT inhaler INL 2 PFS PO Q 4 TO 6 H PRN  5  . promethazine (PHENERGAN) 25 MG suppository Place 1 suppository (25 mg total) rectally every 6 (six) hours as needed for nausea or vomiting. 12 each 5  . STIOLTO RESPIMAT 2.5-2.5 MCG/ACT AERS INL 2 PFS PO QD  5  . traMADol (ULTRAM) 50 MG tablet Take 1 tablet (50 mg total) by mouth every 6 (six) hours as needed. 40 tablet 0  . Vitamin D, Ergocalciferol, (DRISDOL) 50000 units CAPS capsule TK ONE C PO Q WEEK  5  . buPROPion (WELLBUTRIN XL) 150 MG 24 hr  tablet TK 1 T PO QD  5    Review of Systems: GENERAL:  Fatigue.  No fevers, sweats.  Ongoing weight loss. PERFORMANCE STATUS (ECOG):  2 HEENT:  No visual changes, runny nose, sore throat, mouth sores or tenderness. Lungs: No shortness of breath or cough.  No hemoptysis. Cardiac:  No chest pain, palpitations, orthopnea, or PND. GI:  Nausea and vomiting.  No diarrhea, constipation, melena or hematochezia. GU:  No urgency, frequency, dysuria, or hematuria. Musculoskeletal:  Chronic low back pain.  No joint pain.  No muscle tenderness. Extremities:  Lower extremity pain secondary to peripheral vascular disease (sees Dr Fletcher Anon).  No swelling. Skin:  No rashes or skin changes. Neuro:  General weakness.  No headache, numbness or weakness, balance or coordination issues.  Notes falls. Endocrine:  No diabetes, thyroid issues, hot flashes or night sweats. Psych:  No mood changes, depression or anxiety. Pain:  No focal pain. Review of systems:  All other systems reviewed and found to be negative.  Physical Exam:  Blood pressure 111/81, pulse (!) 48, temperature 98 F (36.7 C), temperature source Oral, resp. rate 16, height '5\' 11"'  (1.803 m), weight 104 lb 4.8 oz (47.3 kg), SpO2 99 %.  GENERAL:  Cachectic gentleman lying comfortably on the medical unit in no acute distress. MENTAL STATUS:  Alert and oriented to person, place and time. HEAD:  Normocephalic, atraumatic, face symmetric, no Cushingoid features. EYES:  Brown eyes.  Pupils equal round and reactive to light and accomodation.  No conjunctivitis or scleral icterus. ENT:  Oropharynx clear without lesion.  Tongue normal. Mucous membranes moist.  RESPIRATORY:  Clear to auscultation without rales, wheezes or rhonchi. CARDIOVASCULAR:  Regular rate and rhythm without murmur, rub or gallop. ABDOMEN:  Scaphoid.  Soft, non-tender, with active bowel sounds, and no hepatosplenomegaly.  No masses. SKIN:  No rashes, ulcers or lesions. EXTREMITIES: Thin  extremities.  Legs tender to touch.  No edema, no skin discoloration.  No palpable cords. LYMPH NODES: No palpable cervical, supraclavicular, axillary or inguinal adenopathy  NEUROLOGICAL: Unremarkable. PSYCH:  Appropriate.   Results for orders placed or performed during the hospital encounter of 08/26/17 (from the past 48 hour(s))  Comprehensive metabolic panel     Status: Abnormal   Collection Time: 08/26/17  9:27 PM  Result Value Ref Range   Sodium 129 (  L) 135 - 145 mmol/L    Comment: REPEATED ELECTROLYTES [place]   Potassium 2.5 (LL) 3.5 - 5.1 mmol/L    Comment: CRITICAL RESULT CALLED TO, READ BACK BY AND VERIFIED WITH LAURA CATES ON 08/26/17 2246 [place]    Chloride 84 (L) 101 - 111 mmol/L   CO2 17 (L) 22 - 32 mmol/L   Glucose, Bld 85 65 - 99 mg/dL   BUN 14 6 - 20 mg/dL   Creatinine, Ser 1.19 0.61 - 1.24 mg/dL   Calcium 8.9 8.9 - 10.3 mg/dL   Total Protein 6.4 (L) 6.5 - 8.1 g/dL   Albumin 3.1 (L) 3.5 - 5.0 g/dL   AST 29 15 - 41 U/L   ALT 15 (L) 17 - 63 U/L   Alkaline Phosphatase 79 38 - 126 U/L   Total Bilirubin 2.1 (H) 0.3 - 1.2 mg/dL   GFR calc non Af Amer >60 >60 mL/min   GFR calc Af Amer >60 >60 mL/min    Comment: (NOTE) The eGFR has been calculated using the CKD EPI equation. This calculation has not been validated in all clinical situations. eGFR's persistently <60 mL/min signify possible Chronic Kidney Disease.    Anion gap 28 (H) 5 - 15    Comment: Performed at [facility], [address] [place], [place] [address]  Lipase, blood     Status: None   Collection Time: 08/26/17  9:27 PM  Result Value Ref Range   Lipase 37 11 - 51 U/L    Comment: Performed at [facility], [address] [place], [place] [address]  CBC with Differential     Status: Abnormal   Collection Time: 08/26/17  9:27 PM  Result Value Ref Range   WBC 4.5 3.8 - 10.6 K/uL   RBC 3.74 (L) 4.40 - 5.90 MIL/uL   Hemoglobin 13.9 13.0 - 18.0 g/dL   HCT 38.6 (L) 40.0 - 52.0 %   MCV  103.0 (H) 80.0 - 100.0 fL   MCH 37.0 (H) 26.0 - 34.0 pg   MCHC 35.9 32.0 - 36.0 g/dL   RDW 14.7 (H) 11.5 - 14.5 %   Platelets 232 150 - 440 K/uL   Neutrophils Relative % 72 %   Neutro Abs 3.2 1.4 - 6.5 K/uL   Lymphocytes Relative 15 %   Lymphs Abs 0.7 (L) 1.0 - 3.6 K/uL   Monocytes Relative 12 %   Monocytes Absolute 0.6 0.2 - 1.0 K/uL   Eosinophils Relative 0 %   Eosinophils Absolute 0.0 0 - 0.7 K/uL   Basophils Relative 1 %   Basophils Absolute 0.0 0 - 0.1 K/uL    Comment: Performed at [facility], [address] [place], [place] [address]  Troponin I     Status: None   Collection Time: 08/26/17  9:27 PM  Result Value Ref Range   Troponin I <0.03 <0.03 ng/mL    Comment: Performed at [facility], [address] [place], [place] [address]  Lactic acid, plasma     Status: Abnormal   Collection Time: 08/26/17  9:34 PM  Result Value Ref Range   Lactic Acid, Venous 2.4 (HH) 0.5 - 1.9 mmol/L    Comment: CRITICAL RESULT CALLED TO, READ BACK BY AND VERIFIED WITH LAURA CATES ON 08/26/17 AT 2246 [facility] Performed at [facility], [address] [place], [place] [address]   Lactic acid, plasma     Status: None   Collection Time: 08/26/17 11:54 PM  Result  Value Ref Range   Lactic Acid, Venous 0.6 0.5 - 1.9 mmol/L    Comment: Performed at [facility], [address] [place], [place] [address]  Vitamin B12     Status: None   Collection Time: 08/27/17  4:25 AM  Result Value Ref Range   Vitamin B-12 542 180 - 914 pg/mL    Comment: (NOTE) This assay is not validated for testing neonatal or myeloproliferative syndrome specimens for Vitamin B12 levels. Performed at [facility], [place] [address], [place], [place] [address]   Folate     Status: Abnormal   Collection Time: 08/27/17  4:25 AM  Result Value Ref Range   Folate 5.9 (L) >5.9 ng/mL    Comment: Performed at [facility], [address] [place], [place] [address]  Basic  metabolic panel     Status: Abnormal   Collection Time: 08/27/17  4:25 AM  Result Value Ref Range   Sodium 130 (L) 135 - 145 mmol/L   Potassium 3.4 (L) 3.5 - 5.1 mmol/L   Chloride 92 (L) 101 - 111 mmol/L   CO2 24 22 - 32 mmol/L   Glucose, Bld 79 65 - 99 mg/dL   BUN 12 6 - 20 mg/dL   Creatinine, Ser 0.97 0.61 - 1.24 mg/dL   Calcium 8.0 (L) 8.9 - 10.3 mg/dL   GFR calc non Af Amer >60 >60 mL/min   GFR calc Af Amer >60 >60 mL/min    Comment: (NOTE) The eGFR has been calculated using the CKD EPI equation. This calculation has not been validated in all clinical situations. eGFR's persistently <60 mL/min signify possible Chronic Kidney Disease.    Anion gap 14 5 - 15    Comment: Performed at [facility], [address] [place], [place] [address]  CBC     Status: Abnormal   Collection Time: 08/27/17  4:25 AM  Result Value Ref Range   WBC 2.5 (L) 3.8 - 10.6 K/uL   RBC 3.12 (L) 4.40 - 5.90 MIL/uL   Hemoglobin 11.3 (L) 13.0 - 18.0 g/dL   HCT 31.8 (L) 40.0 - 52.0 %   MCV 101.8 (H) 80.0 - 100.0 fL   MCH 36.1 (H) 26.0 - 34.0 pg   MCHC 35.5 32.0 - 36.0 g/dL   RDW 14.9 (H) 11.5 - 14.5 %   Platelets 180 150 - 440 K/uL    Comment: Performed at [facility], [address] [place], [place] [address]  Prealbumin     Status: Abnormal   Collection Time: 08/27/17  4:25 AM  Result Value Ref Range   Prealbumin 11.9 (L) 18 - 38 mg/dL    Comment: Performed at [facility], [place] [address] [place], [place] [address]  Magnesium     Status: Abnormal   Collection Time: 08/27/17  4:25 AM  Result Value Ref Range   Magnesium 3.4 (H) 1.7 - 2.4 mg/dL    Comment: Performed at [facility], [address] [place], [place] [address]  Phosphorus     Status: None   Collection Time: 08/27/17  4:25 AM  Result Value Ref Range   Phosphorus 3.2 2.5 - 4.6 mg/dL    Comment: Performed at [facility], [address] [place],  [address]  Urinalysis, Complete w  Microscopic     Status: Abnormal   Collection Time: 08/27/17  5:14 AM  Result Value Ref Range   Color, Urine YELLOW (A) YELLOW   APPearance  CLEAR (A) CLEAR   Specific Gravity, Urine 1.010 1.005 - 1.030   pH 5.0 5.0 - 8.0   Glucose, UA NEGATIVE NEGATIVE mg/dL   Hgb urine dipstick NEGATIVE NEGATIVE   Bilirubin Urine NEGATIVE NEGATIVE   Ketones, ur 20 (A) NEGATIVE mg/dL   Protein, ur NEGATIVE NEGATIVE mg/dL   Nitrite NEGATIVE NEGATIVE   Leukocytes, UA NEGATIVE NEGATIVE   RBC / HPF 0-5 0 - 5 RBC/hpf   WBC, UA 0-5 0 - 5 WBC/hpf   Bacteria, UA NONE SEEN NONE SEEN   Squamous Epithelial / LPF 0-5 0 - 5   Mucus PRESENT    Hyaline Casts, UA PRESENT     Comment: Performed at [facility], [address] [place], [place] [address]  MRSA PCR Screening     Status: Abnormal   Collection Time: 08/27/17  8:31 AM  Result Value Ref Range   MRSA by PCR POSITIVE (A) NEGATIVE    Comment:        The GeneXpert MRSA Assay (FDA approved for NASAL specimens only), is one component of a comprehensive MRSA colonization surveillance program. It is not intended to diagnose MRSA infection nor to guide or monitor treatment for MRSA infections. RESULT CALLED TO, READ BACK BY AND VERIFIED WITH: BERENICE ALEJO CALDERON AT 1003 ON 08/27/17 [place]. Performed at [facility], [address] [place], [place] [address]    Dg Chest Portable 1 View  Result Date: 08/26/2017 CLINICAL DATA:  Nausea vomiting and chest pain EXAM: PORTABLE CHEST 1 VIEW COMPARISON:  PET-CT 04/26/2017, chest x-ray 06/28/2016 FINDINGS: Post sternotomy changes. Right-sided central venous port tip over the distal SVC. Hyperinflation with emphysematous disease. No acute airspace disease or pleural effusion. Stable cardiomediastinal silhouette with aortic atherosclerosis. No pneumothorax. Bilateral rib sclerosis corresponding to known skeletal metastatic disease IMPRESSION: No active disease.  Hyperinflation with emphysematous  disease Electronically Signed   By: Donavan Foil M.D.   On: 08/26/2017 22:30    Assessment:  The patient is a 57 y.o. gentleman with extensive stage small cell lung cancer and chronic nausea and vomiting of unclear etiology.  He has lost 40 pounds in the past 6 months.  PET scan on 04/26/2017 revealed no active disease.  UGI on 06/14/2017 revealed no evidence of gastroparesis.  EGD on 08/08/2017 revealed no lesion or obstruction.  Head CT without contrast on 06/21/2017 revealed no metastatic disease.  He describes an appetite, but nausea and vomiting precipitated by food in his upper GI tract and a strong gag reflex.  Plan:   1.  Oncology:  Patient with extensive stage small cell lung cancer.  Concern always exists for recurrent disease, although last PET scan in 04/2017, revealed no evidence of active disease.  Anticipate outpatient follow-up PET scan.  2.  Gastroenterology:  Persistent nausea and vomiting without evidence of gastroparesis or obstruction. Lipase normal.  Consider head MRI without contrast to r/o CNS disease. Labs sent to r/o endocrine disorder.  Patient started on scheduled antiemetics and PPI BID.  Prednisone initiated.  Discussions held regarding potential feeding tube.   Thank you for allowing me to participate in Philip [name] 's care.  I will follow him closely with you while hospitalized and after discharge in the outpatient department.   Lequita Asal, MD  08/27/2017, 8:51 PM

## 2017-08-28 ENCOUNTER — Inpatient Hospital Stay: Payer: Medicaid Other

## 2017-08-28 DIAGNOSIS — E2740 Unspecified adrenocortical insufficiency: Secondary | ICD-10-CM

## 2017-08-28 DIAGNOSIS — Z7902 Long term (current) use of antithrombotics/antiplatelets: Secondary | ICD-10-CM

## 2017-08-28 DIAGNOSIS — R531 Weakness: Secondary | ICD-10-CM

## 2017-08-28 DIAGNOSIS — R5383 Other fatigue: Secondary | ICD-10-CM

## 2017-08-28 LAB — BASIC METABOLIC PANEL
Anion gap: 7 (ref 5–15)
BUN: 8 mg/dL (ref 6–20)
CALCIUM: 8.3 mg/dL — AB (ref 8.9–10.3)
CO2: 26 mmol/L (ref 22–32)
CREATININE: 0.8 mg/dL (ref 0.61–1.24)
Chloride: 95 mmol/L — ABNORMAL LOW (ref 101–111)
GFR calc non Af Amer: >60 mL/min (ref >60)
Glucose, Bld: 136 mg/dL — ABNORMAL HIGH (ref 65–99)
Potassium: 4.5 mmol/L (ref 3.5–5.1)
Sodium: 128 mmol/L — ABNORMAL LOW (ref 135–145)

## 2017-08-28 LAB — CBC
HEMATOCRIT: 33.1 % — AB (ref 40.0–52.0)
Hemoglobin: 11.7 g/dL — ABNORMAL LOW (ref 13.0–18.0)
MCH: 36 pg — ABNORMAL HIGH (ref 26.0–34.0)
MCHC: 35.4 g/dL (ref 32.0–36.0)
MCV: 101.5 fL — ABNORMAL HIGH (ref 80.0–100.0)
PLATELETS: 203 10*3/uL (ref 150–440)
RBC: 3.26 MIL/uL — ABNORMAL LOW (ref 4.40–5.90)
RDW: 15 % — AB (ref 11.5–14.5)
WBC: 2.8 10*3/uL — ABNORMAL LOW (ref 3.8–10.6)

## 2017-08-28 LAB — HIV ANTIBODY (ROUTINE TESTING W REFLEX): HIV SCREEN 4TH GENERATION: NONREACTIVE

## 2017-08-28 LAB — PROTIME-INR
INR: 0.89
Prothrombin Time: 12 seconds (ref 11.4–15.2)

## 2017-08-28 LAB — T4, FREE: Free T4: 1.47 ng/dL (ref 0.82–1.77)

## 2017-08-28 LAB — TSH: TSH: 0.473 u[IU]/mL (ref 0.350–4.500)

## 2017-08-28 LAB — CORTISOL: Cortisol, Plasma: 4.5 ug/dL

## 2017-08-28 MED ORDER — FAMOTIDINE 20 MG PO TABS
20.0000 mg | ORAL_TABLET | Freq: Two times a day (BID) | ORAL | Status: DC
  Administered 2017-08-28 – 2017-08-30 (×4): 20 mg via ORAL
  Filled 2017-08-28 (×4): qty 1

## 2017-08-28 MED ORDER — VITAMIN C 500 MG PO TABS
500.0000 mg | ORAL_TABLET | Freq: Two times a day (BID) | ORAL | Status: DC
  Administered 2017-08-28 – 2017-08-30 (×5): 500 mg via ORAL
  Filled 2017-08-28 (×7): qty 1

## 2017-08-28 MED ORDER — VITAMIN B-1 100 MG PO TABS
100.0000 mg | ORAL_TABLET | Freq: Every day | ORAL | Status: DC
  Administered 2017-08-28 – 2017-08-30 (×3): 100 mg via ORAL
  Filled 2017-08-28 (×3): qty 1

## 2017-08-28 MED ORDER — VITAMIN C 500 MG/5ML PO SYRP
1000.0000 mg | ORAL_SOLUTION | Freq: Two times a day (BID) | ORAL | Status: DC
  Filled 2017-08-28 (×2): qty 10

## 2017-08-28 NOTE — Progress Notes (Signed)
Nutrition Follow Up Note   DOCUMENTATION CODES:   Severe malnutrition in context of chronic illness  INTERVENTION:   If tube feeds initiated recommend: Recommend Osmolite 1.5 '@goal'  rate of 49m/hr- Initiate at 227mhr and increase by 1032mr every 8 hrs until goal rate is reached.   Recommend free water flushes of 20m73m4 hours  Regimen provides 1980kcal/day, 83g/day protein, and 1456ml70m free water  Pt at high refeeding risk; recommend monitor K, Mg, and P labs.   NUTRITION DIAGNOSIS:   Severe Malnutrition related to cancer and cancer related treatments as evidenced by severe fat depletion, severe muscle depletion, 22 percent weight loss in <3 months and 33% wt loss in one year.  GOAL:   Patient will meet greater than or equal to 90% of their needs  -not met  MONITOR:   PO intake, Supplement acceptance, Labs, Weight trends, Skin, I & O's, TF initiation   ASSESSMENT:   57 y.66 male with a known history of Stage IV Lung Ca (s/p chemoradiation 2-83mo [id number] p/w AP, intractable N/V.   Pt seen by GI yesterday; recommend IR placement of post pyloric feeding tube to see if pt tolerates this. If pt is able to tolerate tube feeds, may have J-tube placement at a later date. Tube feed recommendations above. Pt at high refeeding risk.   Medications reviewed and include: vitamin C, plavix, lovenox, folic acid, reglan, MVI, protonix, prednisone, morphine  Labs reviewed: Na 128(L), K 4.5 wnl, Cl 95(L), Ca 8.3(L)  P 3.2 wnl, Mg 3.4(H)- 5/14 Prealbumin- 11.9(L)- 5/14 Wbc 2.8(L), Hgb 11.7(L), Hct 33.1(L), MCV 101.5(H), MCH 36.0(H) Folate- 5.9(L)- 5/14 B12- 542 wnl- 5/14  Diet Order:   Diet Order           Diet full liquid Room service appropriate? Yes; Fluid consistency: Thin  Diet effective now         EDUCATION NEEDS:   Education needs have been addressed  Skin:  Skin Assessment: Reviewed RN Assessment  Last BM:  5/12  Height:   Ht Readings from Last 1 Encounters:   08/27/17 '5\' 11"'  (1.803 m)    Weight:   Wt Readings from Last 1 Encounters:  08/27/17 104 lb 4.8 oz (47.3 kg)    Ideal Body Weight:  78.1 kg  BMI:  Body mass index is 14.55 kg/m.  Estimated Nutritional Needs:   Kcal:  1850-2150kcal/day   Protein:  71-80g/day   Fluid:  >1.4L/day   Tavita Eastham Koleen DistanceD, LDN Pager #- [phone]#- [phone] Hours Pager: [phone]

## 2017-08-28 NOTE — Progress Notes (Signed)
Per verbal MD orders, ok to advance patient to full liquid diet. Will continue to monitor.

## 2017-08-28 NOTE — Progress Notes (Signed)
[facility] Hematology/Oncology Progress Note  Date of admission: 08/26/2017  Hospital day:  08/28/2017  Chief Complaint: Philip [name] is a 57 y.o. male with extensive stage small cell lung cancer who was admitted through the emergency room with nausea, vomiting, dehydration and electrolyte abnormalities.  Subjective:  Feeling better today.  Able to drink some liquids and eat [place] ice without gag reflex or emesis.  Social History: The patient is accompanied by his wife today.  Allergies:  Allergies  Allergen Reactions  . Tylenol [Acetaminophen] Palpitations  . Amitriptyline Other (See Comments)    Burning sensation all over  . Aspirin   . Contrast Media [Iodinated Diagnostic Agents] Other (See Comments)    Pt states that he got "knots behind his ears"  . Nsaids Other (See Comments)    Blood in stools  . Ibuprofen Other (See Comments) and Nausea Only    Blood in stools  . Tape Rash and Other (See Comments)    PLEASE USE COBAN WRAP; THE PATIENT'S SKIN TEARS WHEN BANDAGES ARE REMOVED!!    Scheduled Medications: . Chlorhexidine Gluconate Cloth  6 each Topical Q0600  . clopidogrel  75 mg Oral Daily  . enoxaparin (LOVENOX) injection  40 mg Subcutaneous Q24H  . famotidine  20 mg Oral BID  . feeding supplement  1 Container Oral TID BM  . feeding supplement (ENSURE ENLIVE)  237 mL Oral TID BM  . folic acid  1 mg Oral Daily  . metoCLOPramide  5 mg Oral TID AC & HS  . metoprolol tartrate  25 mg Oral BID  . multivitamin with minerals  1 tablet Oral Daily  . mupirocin ointment  1 application Nasal BID  . OLANZapine zydis  10 mg Oral QHS  . predniSONE  40 mg Oral Q breakfast  . sodium chloride flush  10-40 mL Intracatheter Q12H  . thiamine  100 mg Oral Daily  . vitamin C  500 mg Oral BID    Review of Systems: GENERAL:  Fatigue.  No fevers, sweats.  Weight loss of 40 pounds in past 6 months. PERFORMANCE STATUS (ECOG):  2 HEENT:  No visual changes, runny  nose, sore throat, mouth sores or tenderness.  No epistaxis. Lungs: No shortness of breath or cough.  No hemoptysis. Cardiac:  No chest pain, palpitations, orthopnea, or PND. GI:  Less nausea.  No emesis today.  No diarrhea, constipation, melena or hematochezia. GU:  No urgency, frequency, dysuria, or hematuria. Musculoskeletal:  Chronic low back pain.  No joint pain.  No muscle tenderness. Extremities:  Lower extremity pain secondary to PVD.  No swelling. Skin:  Several new areas of bruising.  No bleeding.  No rashes or skin changes. Neuro:  General weakness.  No headache, numbness or focal weakness, balance or coordination issues. Endocrine:  No diabetes, thyroid issues, hot flashes or night sweats. Psych:  No mood changes, depression or anxiety. Pain:  Lower rib discomfort. Review of systems:  All other systems reviewed and found to be negative.  Physical Exam: Blood pressure 118/87, pulse (!) 110, temperature (!) 97.5 F (36.4 C), temperature source Oral, resp. rate 16, height _0  (1.803 m), weight 104 lb 4.8 oz (47.3 kg), SpO2 99 %.  GENERAL:  Cachectic gentleman sitting up in bed comfortably on the medical unit in no acute distress. MENTAL STATUS:  Alert and oriented to person, place and time. HEAD:  Normocephalic, atraumatic, face symmetric, no Cushingoid features. EYES:  Brown eyes.  Pupils equal round and  reactive to light and accomodation.  No conjunctivitis or scleral icterus. ENT:  Oropharynx clear without lesion.  Tongue normal. Mucous membranes moist.  No palatal petechiae. RESPIRATORY:  Clear to auscultation without rales, wheezes or rhonchi. CARDIOVASCULAR:  Regular rate and rhythm without murmur, rub or gallop. ABDOMEN:  Scaphoid.  Soft, non-tender, with active bowel sounds, and no hepatosplenomegaly.  No masses. SKIN:  Several confluent areas of non-palpable purpura involving distal upper extremities and upper chest.  Back with multiple old small bruises.  No ecchymosis  or petechiae on legs, abdomen, or lower chest.  No rashes, ulcers or lesions. EXTREMITIES: No edema, no skin discoloration or tenderness.  No palpable cords. LYMPH NODES: No palpable cervical, supraclavicular, axillary or inguinal adenopathy  NEUROLOGICAL: Unremarkable. PSYCH:  Appropriate.   Results for orders placed or performed during the hospital encounter of 08/26/17 (from the past 48 hour(s))  Lactic acid, plasma     Status: None   Collection Time: 08/26/17 11:54 PM  Result Value Ref Range   Lactic Acid, Venous 0.6 0.5 - 1.9 mmol/L    Comment: Performed at [facility], [address] [place], [place] [address]  Vitamin B12     Status: None   Collection Time: 08/27/17  4:25 AM  Result Value Ref Range   Vitamin B-12 542 180 - 914 pg/mL    Comment: (NOTE) This assay is not validated for testing neonatal or myeloproliferative syndrome specimens for Vitamin B12 levels. Performed at [facility], [place] [address] [place], [place] [address]   Folate     Status: Abnormal   Collection Time: 08/27/17  4:25 AM  Result Value Ref Range   Folate 5.9 (L) >5.9 ng/mL    Comment: Performed at [facility], [address] [place], [place] [address]  HIV antibody (Routine Testing)     Status: None   Collection Time: 08/27/17  4:25 AM  Result Value Ref Range   HIV Screen 4th Generation wRfx Non Reactive Non Reactive    Comment: (NOTE) Performed At: [facility] [place], [place] [id number] Rush Farmer MD [id number] Performed at [facility], [address] [place], [place] [address]   Basic metabolic panel     Status: Abnormal   Collection Time: 08/27/17  4:25 AM  Result Value Ref Range   Sodium 130 (L) 135 - 145 mmol/L   Potassium 3.4 (L) 3.5 - 5.1 mmol/L   Chloride 92 (L) 101 - 111 mmol/L   CO2 24 22 - 32 mmol/L   Glucose, Bld 79 65 - 99 mg/dL   BUN 12 6 - 20 mg/dL   Creatinine, Ser 0.97 0.61 - 1.24 mg/dL   Calcium  8.0 (L) 8.9 - 10.3 mg/dL   GFR calc non Af Amer >60 >60 mL/min   GFR calc Af Amer >60 >60 mL/min    Comment: (NOTE) The eGFR has been calculated using the CKD EPI equation. This calculation has not been validated in all clinical situations. eGFR's persistently <60 mL/min signify possible Chronic Kidney Disease.    Anion gap 14 5 - 15    Comment: Performed at [facility], [address] [place], [place] [address]  CBC     Status: Abnormal   Collection Time: 08/27/17  4:25 AM  Result Value Ref Range   WBC 2.5 (L) 3.8 - 10.6 K/uL   RBC 3.12 (L) 4.40 - 5.90 MIL/uL   Hemoglobin 11.3 (L) 13.0 - 18.0 g/dL   HCT  31.8 (L) 40.0 - 52.0 %   MCV 101.8 (H) 80.0 - 100.0 fL   MCH 36.1 (H) 26.0 - 34.0 pg   MCHC 35.5 32.0 - 36.0 g/dL   RDW 14.9 (H) 11.5 - 14.5 %   Platelets 180 150 - 440 K/uL    Comment: Performed at [facility], [address] [place], [place] [address]  Prealbumin     Status: Abnormal   Collection Time: 08/27/17  4:25 AM  Result Value Ref Range   Prealbumin 11.9 (L) 18 - 38 mg/dL    Comment: Performed at [place] [address] [place], [place] [address]  Magnesium     Status: Abnormal   Collection Time: 08/27/17  4:25 AM  Result Value Ref Range   Magnesium 3.4 (H) 1.7 - 2.4 mg/dL    Comment: Performed at [facility], [address] [place], [place] [address]  Phosphorus     Status: None   Collection Time: 08/27/17  4:25 AM  Result Value Ref Range   Phosphorus 3.2 2.5 - 4.6 mg/dL    Comment: Performed at [facility], [address] [place], [place] [address]  Urinalysis, Complete w Microscopic     Status: Abnormal   Collection Time: 08/27/17  5:14 AM  Result Value Ref Range   Color, Urine YELLOW (A) YELLOW   APPearance CLEAR (A) CLEAR   Specific Gravity, Urine 1.010 1.005 - 1.030   pH 5.0 5.0 - 8.0   Glucose, UA NEGATIVE NEGATIVE mg/dL   Hgb urine dipstick NEGATIVE NEGATIVE   Bilirubin Urine NEGATIVE NEGATIVE    Ketones, ur 20 (A) NEGATIVE mg/dL   Protein, ur NEGATIVE NEGATIVE mg/dL   Nitrite NEGATIVE NEGATIVE   Leukocytes, UA NEGATIVE NEGATIVE   RBC / HPF 0-5 0 - 5 RBC/hpf   WBC, UA 0-5 0 - 5 WBC/hpf   Bacteria, UA NONE SEEN NONE SEEN   Squamous Epithelial / LPF 0-5 0 - 5   Mucus PRESENT    Hyaline Casts, UA PRESENT     Comment: Performed at [facility], [address] [place], [place] [address]  MRSA PCR Screening     Status: Abnormal   Collection Time: 08/27/17  8:31 AM  Result Value Ref Range   MRSA by PCR POSITIVE (A) NEGATIVE    Comment:        The GeneXpert MRSA Assay (FDA approved for NASAL specimens only), is one component of a comprehensive MRSA colonization surveillance program. It is not intended to diagnose MRSA infection nor to guide or monitor treatment for MRSA infections. RESULT CALLED TO, READ BACK BY AND VERIFIED WITH: BERENICE ALEJO CALDERON AT 1003 ON 08/27/17 [place]. Performed at [facility], [address] [place], [place] [address]   Cortisol     Status: None   Collection Time: 08/28/17  3:29 AM  Result Value Ref Range   Cortisol, Plasma 4.5 ug/dL    Comment: (NOTE) AM    6.7 - 22.6 ug/dL PM   <10.0       ug/dL Performed at [place] [address] [place], [place] [address]   TSH     Status: None   Collection Time: 08/28/17  3:29 AM  Result Value Ref Range   TSH 0.473 0.350 - 4.500 uIU/mL    Comment: Performed by a 3rd Generation assay with a functional sensitivity of <=0.01 uIU/mL. Performed at [facility], [address] [place], [place] [address]  T4, free     Status: None   Collection Time: 08/28/17  3:29 AM  Result Value Ref Range   Free T4 1.47 0.82 - 1.77 ng/dL    Comment: (NOTE) Biotin ingestion may interfere with free T4 tests. If the results are inconsistent with the TSH level, previous test results, or the clinical presentation, then consider biotin interference. If needed, order repeat  testing after stopping biotin. Performed at [facility], [address] [place], [place] [address]   CBC     Status: Abnormal   Collection Time: 08/28/17  9:54 AM  Result Value Ref Range   WBC 2.8 (L) 3.8 - 10.6 K/uL   RBC 3.26 (L) 4.40 - 5.90 MIL/uL   Hemoglobin 11.7 (L) 13.0 - 18.0 g/dL   HCT 33.1 (L) 40.0 - 52.0 %   MCV 101.5 (H) 80.0 - 100.0 fL   MCH 36.0 (H) 26.0 - 34.0 pg   MCHC 35.4 32.0 - 36.0 g/dL   RDW 15.0 (H) 11.5 - 14.5 %   Platelets 203 150 - 440 K/uL    Comment: Performed at [facility], [address] [place], [place] [address]  Basic metabolic panel     Status: Abnormal   Collection Time: 08/28/17  9:54 AM  Result Value Ref Range   Sodium 128 (L) 135 - 145 mmol/L   Potassium 4.5 3.5 - 5.1 mmol/L   Chloride 95 (L) 101 - 111 mmol/L   CO2 26 22 - 32 mmol/L   Glucose, Bld 136 (H) 65 - 99 mg/dL   BUN 8 6 - 20 mg/dL   Creatinine, Ser 0.80 0.61 - 1.24 mg/dL   Calcium 8.3 (L) 8.9 - 10.3 mg/dL   GFR calc non Af Amer >60 >60 mL/min   GFR calc Af Amer >60 >60 mL/min    Comment: (NOTE) The eGFR has been calculated using the CKD EPI equation. This calculation has not been validated in all clinical situations. eGFR's persistently <60 mL/min signify possible Chronic Kidney Disease.    Anion gap 7 5 - 15    Comment: Performed at [facility], [address] [place], [place] [address]  Protime-INR     Status: None   Collection Time: 08/28/17  9:54 AM  Result Value Ref Range   Prothrombin Time 12.0 11.4 - 15.2 seconds   INR 0.89     Comment: Performed at [facility], [place], [place] [address]   Mr Brain 64 Contrast  Result Date: 08/28/2017 CLINICAL DATA:  57 year old male with history of stage IV metastatic lung cancer with headaches, nausea, and vomiting. Evaluate for intracranial metastatic disease. EXAM: MRI HEAD WITHOUT CONTRAST TECHNIQUE: Multiplanar, multiecho pulse sequences of the brain and  surrounding structures were obtained without intravenous contrast. COMPARISON:  Prior head CT from 06/21/2017. FINDINGS: Brain: Generalized age-related cerebral atrophy. Patchy and confluent T2/FLAIR hyperintensity within the periventricular white matter most consistent with chronic small vessel ischemic change, moderate nature. Small vessel changes present within the pons as well. 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 No evidence for acute infarct. Gray-white matter differentiation otherwise maintained. No evidence for acute or chronic intracranial hemorrhage. No mass lesion, midline shift or mass effect. No evidence for intracranial metastasis on this noncontrast examination. No hydrocephalus. No extra-axial fluid collection. Major dural  sinuses are grossly patent. Pituitary gland suprasellar region normal. Vascular: Major intracranial vascular flow voids are maintained. Skull and upper cervical spine: Craniocervical junction within normal limits. Upper cervical spine unremarkable. Visualized bone marrow within normal limits. No discrete osseous lesions. No scalp soft tissue abnormality. Sinuses/Orbits: Globes and orbital soft tissues within normal limits. Paranasal sinuses are largely clear. Bilateral mastoid effusions, likely benign/reactive in nature. Inner ear structures grossly normal. Other: None. IMPRESSION: 1. No intracranial metastases identified. Please note evaluation somewhat limited given lack of IV contrast, with limited evaluation for possible small/early metastatic lesions. 2. No other acute intracranial abnormality. 3. 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 4. Moderate chronic small vessel ischemic change. Electronically Signed   By: Jeannine Boga M.D.   On: 08/28/2017 13:43   Dg Chest Portable 1 View  Result Date: 08/26/2017 CLINICAL DATA:  Nausea vomiting and chest pain EXAM: PORTABLE CHEST 1 VIEW COMPARISON:  PET-CT 04/26/2017, chest x-ray 06/28/2016 FINDINGS: Post sternotomy changes. Right-sided central venous port tip over the distal SVC. Hyperinflation with emphysematous disease. No acute airspace disease or pleural effusion. Stable cardiomediastinal silhouette with aortic atherosclerosis. No pneumothorax. Bilateral rib sclerosis corresponding to known skeletal metastatic disease IMPRESSION: No active disease.  Hyperinflation with emphysematous disease Electronically Signed   By: Donavan Foil M.D.   On: 08/26/2017 22:30    Assessment:  Philip [name] is a 57 y.o. male with extensive stage small cell lung cancer and chronic nausea and vomiting of unclear etiology.  He has lost 40 pounds in the past 6 months.  PET scan on 04/26/2017 revealed no active disease.  UGI on 06/14/2017 revealed no evidence of gastroparesis.  EGD on 08/08/2017 revealed no lesion or obstruction.  Head CT without contrast on 06/21/2017 revealed no metastatic disease.  Head MRI on 08/28/2017 without acute abnormality.  He began prednisone 40 mg a day on 08/27/2017.  He has adrenal insufficiency.  Symptomatically, he has improved.  Nausea has decreased.  He has had no emesis today.  He has tolerated liquids today.  Plan:   1.  Oncology:  Patient with extensive stage small cell lung cancer.  Concern always exists for recurrent disease, although last PET scan in 04/2017, revealed no evidence of active disease.  Anticipate outpatient follow-up PET scan if nausea/voimiting and associated weight loss unresolved.  2.  Gastroenterology:  Patient admitted with persistent nausea and vomiting without evidence of gastroparesis or obstruction. Lipase normal.  Head MRI without contrast to r/o CNS disease was negative today. Scheduled antiemetics and PPI BID  continue.  Additional work-up to r/o endocrine disorder has revealed adrenal insufficiency (low cortisol).  Clinically patient better on prednisone today.  If continues to tolerate liquids with increased caloric intake, can postpone tube feeds.  3.  Dermatology:  Patient with small areas of ecchymosis on admission.  New confluent areas on chest and lower arms.  No bleeding.  Platelet count normal.  PT normal.  Patient on Plavix at home for known CAD/PVD.  Lovenox added to DVT prophylaxis.  Risk of bleeding with Lovenox may be increased secondary to patient's low weight.  4.  Fluids/Electrolytes/Nutrition:  Oral intake improved today.  CrCl 68 ml/min.  Folate deficiency being supplemented.  Thiamine and vitamin C added today.  Appreciate Nutrition consult.  High risk refeeding.  May be able to postpone tube feeds if daily progress made.   Lequita Asal, MD  08/28/2017, 10:03 PM

## 2017-08-28 NOTE — Progress Notes (Signed)
Cephas Darby, MD [address]  [place]  [place], [place] [address]  Main: [phone]  Fax: [phone] Pager: [phone]   Subjective: Patient is sitting in chair. He reports that nausea is better and has been tolerating liquid diet since yesterday. He continues to have chest pain. He received prednisone and started on Reglan. He is transitioned to full liquid diet today   Objective: Vital signs in last 24 hours: Vitals:   08/28/17 0425 08/28/17 0752 08/28/17 1608 08/28/17 1704  BP: 94/69 122/84 92/69 105/81  Pulse: 73 66 71 79  Resp: 16     Temp: 97.6 F (36.4 C) (!) 97.5 F (36.4 C) 97.7 F (36.5 C)   TempSrc: Oral Oral Oral   SpO2: 95% 100% 100% 100%  Weight:      Height:       Weight change:   Intake/Output Summary (Last 24 hours) at 08/28/2017 1732 Last data filed at 08/28/2017 1405 Gross per 24 hour  Intake 626.25 ml  Output 450 ml  Net 176.25 ml     Exam: Heart:: Regular rate and rhythm or S1S2 present Lungs: normal and clear to auscultation Abdomen: soft, nontender, normal bowel sounds   Lab Results: CBC Latest Ref Rng & Units 08/28/2017 08/27/2017 08/26/2017  WBC 3.8 - 10.6 K/uL 2.8(L) 2.5(L) 4.5  Hemoglobin 13.0 - 18.0 g/dL 11.7(L) 11.3(L) 13.9  Hematocrit 40.0 - 52.0 % 33.1(L) 31.8(L) 38.6(L)  Platelets 150 - 440 K/uL 203 180 232   CMP Latest Ref Rng & Units 08/28/2017 08/27/2017 08/26/2017  Glucose 65 - 99 mg/dL 136(H) 79 85  BUN 6 - 20 mg/dL 8 12 14   Creatinine 0.61 - 1.24 mg/dL 0.80 0.97 1.19  Sodium 135 - 145 mmol/L 128(L) 130(L) 129(L)  Potassium 3.5 - 5.1 mmol/L 4.5 3.4(L) 2.5(LL)  Chloride 101 - 111 mmol/L 95(L) 92(L) 84(L)  CO2 22 - 32 mmol/L 26 24 17(L)  Calcium 8.9 - 10.3 mg/dL 8.3(L) 8.0(L) 8.9  Total Protein 6.5 - 8.1 g/dL - - 6.4(L)  Total Bilirubin 0.3 - 1.2 mg/dL - - 2.1(H)  Alkaline Phos 38 - 126 U/L - - 79  AST 15 - 41 U/L - - 29  ALT 17 - 63 U/L - - 15(L)   Micro Results: Recent Results (from the past 240  hour(s))  MRSA PCR Screening     Status: Abnormal   Collection Time: 08/27/17  8:31 AM  Result Value Ref Range Status   MRSA by PCR POSITIVE (A) NEGATIVE Final    Comment:        The GeneXpert MRSA Assay (FDA approved for NASAL specimens only), is one component of a comprehensive MRSA colonization surveillance program. It is not intended to diagnose MRSA infection nor to guide or monitor treatment for MRSA infections. RESULT CALLED TO, READ BACK BY AND VERIFIED WITH: BERENICE ALEJO CALDERON AT 1003 ON 08/27/17 [place]. Performed at [facility], [place], [place] [address]    Studies/Results: Mr Herby Abraham Contrast  Result Date: 08/28/2017 CLINICAL DATA:  57 year old male with history of stage IV metastatic lung cancer with headaches, nausea, and vomiting. Evaluate for intracranial metastatic disease. EXAM: MRI HEAD WITHOUT CONTRAST TECHNIQUE: Multiplanar, multiecho pulse sequences of the brain and surrounding structures were obtained without intravenous contrast. COMPARISON:  Prior head CT from 06/21/2017. FINDINGS: Brain: Generalized age-related cerebral atrophy. Patchy and confluent T2/FLAIR hyperintensity within the periventricular white matter most consistent with chronic small vessel ischemic change, moderate nature. Small vessel  changes present within the pons as well. 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 No evidence for acute infarct. Gray-white matter differentiation otherwise maintained. No evidence for acute or chronic intracranial hemorrhage. No mass lesion, midline shift or mass effect. No evidence for intracranial metastasis on this noncontrast examination. No hydrocephalus. No extra-axial fluid collection. Major dural sinuses are grossly patent. Pituitary  gland suprasellar region normal. Vascular: Major intracranial vascular flow voids are maintained. Skull and upper cervical spine: Craniocervical junction within normal limits. Upper cervical spine unremarkable. Visualized bone marrow within normal limits. No discrete osseous lesions. No scalp soft tissue abnormality. Sinuses/Orbits: Globes and orbital soft tissues within normal limits. Paranasal sinuses are largely clear. Bilateral mastoid effusions, likely benign/reactive in nature. Inner ear structures grossly normal. Other: None. IMPRESSION: 1. No intracranial metastases identified. Please note evaluation somewhat limited given lack of IV contrast, with limited evaluation for possible small/early metastatic lesions. 2. No other acute intracranial abnormality. 3. 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 4. Moderate chronic small vessel ischemic change. Electronically Signed   By: Jeannine Boga M.D.   On: 08/28/2017 13:43   Dg Chest Portable 1 View  Result Date: 08/26/2017 CLINICAL DATA:  Nausea vomiting and chest pain EXAM: PORTABLE CHEST 1 VIEW COMPARISON:  PET-CT 04/26/2017, chest x-ray 06/28/2016 FINDINGS: Post sternotomy changes. Right-sided central venous port tip over the distal SVC. Hyperinflation with emphysematous disease. No acute airspace disease or pleural effusion. Stable cardiomediastinal silhouette with aortic atherosclerosis. No pneumothorax. Bilateral rib sclerosis corresponding to known skeletal metastatic disease IMPRESSION: No active disease.  Hyperinflation with emphysematous disease Electronically Signed   By: Donavan Foil M.D.   On: 08/26/2017 22:30   Medications: I have reviewed the patient's current medications. Scheduled Meds: . Chlorhexidine Gluconate Cloth  6 each Topical Q0600  . clopidogrel  75 mg Oral Daily  . enoxaparin (LOVENOX) injection  40 mg Subcutaneous Q24H  . famotidine  20 mg Oral BID  . feeding supplement   1 Container Oral TID BM  . feeding supplement (ENSURE ENLIVE)  237 mL Oral TID BM  . folic acid  1 mg Oral Daily  . metoCLOPramide  5 mg Oral TID AC & HS  . metoprolol tartrate  25 mg Oral BID  . multivitamin with minerals  1 tablet Oral Daily  . mupirocin ointment  1 application Nasal BID  . OLANZapine zydis  10 mg Oral QHS  . predniSONE  40 mg Oral Q breakfast  . sodium chloride flush  10-40 mL Intracatheter Q12H  . thiamine  100 mg Oral Daily  . vitamin C  500 mg Oral BID   Continuous Infusions: PRN Meds:.albuterol, bisacodyl, diphenhydrAMINE, morphine injection **OR** morphine injection, ondansetron (ZOFRAN) IV, promethazine, senna-docusate, sodium chloride flush   JOHANN SANTONE is a 57 y.o. male with stage IV lung cancer,status post radiation and chemotherapy with approximately six-month history of bilateral subcostal chest pain associated with intractable nausea, poor by mouth intake resulting in severe protein calorie malnutrition. I suspect if his bilateral chest pain is secondary to radiation that may have resulted in some scarring/fibrosis of intrathoracic cavity  Symptoms are slowly improving  - recommend adequate pain control - Zofran 4 mg alternate with Phenergan 25 mg every 6 hours - continue Zyprexa 10 mg at bedtime if he  is able to tolerate - continue Reglan 5 mg before each meal and at bedtime - Switch from Protonix to Pepcid - Empiric trial of prednisone 40mg  daily for intractable nausea, continue current dose for now - popsicles every 4-6 hours - advance diet as tolerated - Recommend placement of dobhoff by radiology under fluoroscopy for postpyloric tube feeds  - We will assess response to the above regimen in controlling his nausea before proceeding with G-J placement  We will follow along with you   LOS: 1 day   Alexiah Koroma 08/28/2017, 5:32 PM

## 2017-08-28 NOTE — Progress Notes (Addendum)
[facility] NAME: Philip [name]    MR#:  [id number]  DATE OF BIRTH:  10/30/1960  SUBJECTIVE:  CHIEF COMPLAINT:   Chief Complaint  Patient presents with  . Chest Pain    has chronic complaining of vomiting and nausea and not able to tolerate oral food came with generalized weakness and noted to have electrolyte imbalance. Have rashes/ echymosis/ patechie on both arms, abd, back today.  REVIEW OF SYSTEMS:  CONSTITUTIONAL: No fever, have fatigue or weakness.  EYES: No blurred or double vision.  EARS, NOSE, AND THROAT: No tinnitus or ear pain.  RESPIRATORY: No cough, shortness of breath, wheezing or hemoptysis.  CARDIOVASCULAR: No chest pain, orthopnea, edema.  GASTROINTESTINAL: have nausea, vomiting, no diarrhea or abdominal pain.  GENITOURINARY: No dysuria, hematuria.  ENDOCRINE: No polyuria, nocturia,  HEMATOLOGY: No anemia, have bruising ,no bleeding SKIN: No rash or lesion. MUSCULOSKELETAL: No joint pain or arthritis.   NEUROLOGIC: No tingling, numbness, weakness.  PSYCHIATRY: No anxiety or depression.   ROS  DRUG ALLERGIES:   Allergies  Allergen Reactions  . Tylenol [Acetaminophen] Palpitations  . Amitriptyline Other (See Comments)    Burning sensation all over  . Aspirin   . Contrast Media [Iodinated Diagnostic Agents] Other (See Comments)    Pt states that he got "knots behind his ears"  . Nsaids Other (See Comments)    Blood in stools  . Ibuprofen Other (See Comments) and Nausea Only    Blood in stools  . Tape Rash and Other (See Comments)    PLEASE USE COBAN WRAP; THE PATIENT'S SKIN TEARS WHEN BANDAGES ARE REMOVED!!    VITALS:  Blood pressure 122/84, pulse 66, temperature (!) 97.5 F (36.4 C), temperature source Oral, resp. rate 16, height 5\' 11"  (1.803 m), weight 47.3 kg (104 lb 4.8 oz), SpO2 100 %.  PHYSICAL EXAMINATION:  GENERAL:  57 y.o.-year-old thin, malnourished patient lying in the bed with no  acute distress.  EYES: Pupils equal, round, reactive to light and accommodation. No scleral icterus. Extraocular muscles intact.  HEENT: Head atraumatic, normocephalic. Oropharynx and nasopharynx clear.  NECK:  Supple, no jugular venous distention. No thyroid enlargement, no tenderness.  LUNGS: Normal breath sounds bilaterally, no wheezing, rales,rhonchi or crepitation. No use of accessory muscles of respiration.  CARDIOVASCULAR: S1, S2 normal. No murmurs, rubs, or gallops.  ABDOMEN: Soft, nontender, nondistended. Bowel sounds present. No organomegaly or mass.  EXTREMITIES: No pedal edema, cyanosis, or clubbing. Muscle wasting. NEUROLOGIC: Cranial nerves II through XII are intact. Muscle strength 4/5 in all extremities. Sensation intact. Gait not checked.  PSYCHIATRIC: The patient is alert and oriented x 3.  SKIN: on both arms, abd and back have echymotic and patechial lesions, non blanching, no itching, no active bleed.   Physical Exam LABORATORY PANEL:   CBC Recent Labs  Lab 08/28/17 0954  WBC 2.8*  HGB 11.7*  HCT 33.1*  PLT 203   ------------------------------------------------------------------------------------------------------------------  Chemistries  Recent Labs  Lab 08/26/17 2127 08/27/17 0425 08/28/17 0954  NA 129* 130* 128*  K 2.5* 3.4* 4.5  CL 84* 92* 95*  CO2 17* 24 26  GLUCOSE 85 79 136*  BUN 14 12 8   CREATININE 1.19 0.97 0.80  CALCIUM 8.9 8.0* 8.3*  MG  --  3.4*  --   AST 29  --   --   ALT 15*  --   --   ALKPHOS 79  --   --   BILITOT 2.1*  --   --    ------------------------------------------------------------------------------------------------------------------  Cardiac Enzymes Recent Labs  Lab 08/26/17 2127  TROPONINI <0.03   ------------------------------------------------------------------------------------------------------------------  RADIOLOGY:  Mr Brain Wo Contrast  Result Date: 08/28/2017 CLINICAL DATA:  57 year old male with  history of stage IV metastatic lung cancer with headaches, nausea, and vomiting. Evaluate for intracranial metastatic disease. EXAM: MRI HEAD WITHOUT CONTRAST TECHNIQUE: Multiplanar, multiecho pulse sequences of the brain and surrounding structures were obtained without intravenous contrast. COMPARISON:  Prior head CT from 06/21/2017. FINDINGS: Brain: Generalized age-related cerebral atrophy. Patchy and confluent T2/FLAIR hyperintensity within the periventricular white matter most consistent with chronic small vessel ischemic change, moderate nature. Small vessel changes present within the pons as well. 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 No evidence for acute infarct. Gray-white matter differentiation otherwise maintained. No evidence for acute or chronic intracranial hemorrhage. No mass lesion, midline shift or mass effect. No evidence for intracranial metastasis on this noncontrast examination. No hydrocephalus. No extra-axial fluid collection. Major dural sinuses are grossly patent. Pituitary gland suprasellar region normal. Vascular: Major intracranial vascular flow voids are maintained. Skull and upper cervical spine: Craniocervical junction within normal limits. Upper cervical spine unremarkable. Visualized bone marrow within normal limits. No discrete osseous lesions. No scalp soft tissue abnormality. Sinuses/Orbits: Globes and orbital soft tissues within normal limits. Paranasal sinuses are largely clear. Bilateral mastoid effusions, likely benign/reactive in nature. Inner ear structures grossly normal. Other: None. IMPRESSION: 1. No intracranial metastases identified. Please note evaluation somewhat limited given lack of IV contrast, with limited evaluation for possible small/early metastatic lesions.  2. No other acute intracranial abnormality. 3. 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 4. Moderate chronic small vessel ischemic change. Electronically Signed   By: Jeannine Boga M.D.   On: 08/28/2017 13:43   Dg Chest Portable 1 View  Result Date: 08/26/2017 CLINICAL DATA:  Nausea vomiting and chest pain EXAM: PORTABLE CHEST 1 VIEW COMPARISON:  PET-CT 04/26/2017, chest x-ray 06/28/2016 FINDINGS: Post sternotomy changes. Right-sided central venous port tip over the distal SVC. Hyperinflation with emphysematous disease. No acute airspace disease or pleural effusion. Stable cardiomediastinal silhouette with aortic atherosclerosis. No pneumothorax. Bilateral rib sclerosis corresponding to known skeletal metastatic disease IMPRESSION: No active disease.  Hyperinflation with emphysematous disease Electronically Signed   By: Donavan Foil M.D.   On: 08/26/2017 22:30    ASSESSMENT AND PLAN:   Active Problems:   Intractable nausea and vomiting   Protein-calorie malnutrition, severe  * severe malnutrition   Due to recurrent nausea and vomiting   He had been seen by GI and EGD was done which was negative except for gastritis.   He had tried antiemetic medications multiple times in the past and advised by oncologist.   Dietary consult, and he may need to consider jejunal feeding tube.  Called Radiology help for Dobhoff tube placement.  * hyponatremia   Hypokalemia   Acute renal insufficiency    This is secondary to decreased oral intake and dehydration.   IV fluids, consultGI and oncology to help making further plan to avoid this happening again.  * macrocytosis anemia    This could be secondary to malnutrition   Vitamin supplements, B12 level is normal.  * metastatic lung cancer   Oncology consult to help further management. As per patient he was in remission.   As persistent nausea,  suggest to check MRI brain.  * Recent gastritis    Continue pantoprazole.  * Coronary artery disease   COntinue metoprolol, Plavix.  * Skin lesions    Echymosis and patechial   Platelet are normal   INR is normal.   May be due to vitamin deficiency- start replacing Vit C and thiamine.  * Adrenal insufficiency   Oral prednisone.  All the records are reviewed and case discussed with Care Management/Social Workerr. Management plans discussed with the patient, family and they are in agreement.  CODE STATUS: Full.  TOTAL TIME TAKING CARE OF THIS PATIENT: 35 minutes.   Wife in room during my visit.  POSSIBLE D/C IN 1-2 DAYS, DEPENDING ON CLINICAL CONDITION.   Vaughan Basta M.D on 08/28/2017   Between 7am to 6pm - Pager - [phone]  After 6pm go to [email or web address] - password [email or web address] [facility]  Office  [phone]  CC: Primary care physician; Lorelee Market, MD  Note: This dictation was prepared with Dragon dictation along with smaller phrase technology. Any transcriptional errors that result from this process are unintentional.

## 2017-08-28 NOTE — Progress Notes (Signed)
Verbal orders from Dr.Vachhani to discontinue fluids. Fluids discontinued. Will continue to monitor patient.

## 2017-08-29 LAB — BASIC METABOLIC PANEL
ANION GAP: 7 (ref 5–15)
BUN: 8 mg/dL (ref 6–20)
CO2: 26 mmol/L (ref 22–32)
Calcium: 8.4 mg/dL — ABNORMAL LOW (ref 8.9–10.3)
Chloride: 97 mmol/L — ABNORMAL LOW (ref 101–111)
Creatinine, Ser: 0.88 mg/dL (ref 0.61–1.24)
GFR calc Af Amer: >60 mL/min (ref >60)
Glucose, Bld: 105 mg/dL — ABNORMAL HIGH (ref 65–99)
POTASSIUM: 4 mmol/L (ref 3.5–5.1)
Sodium: 130 mmol/L — ABNORMAL LOW (ref 135–145)

## 2017-08-29 LAB — CBC
HEMATOCRIT: 27 % — AB (ref 40.0–52.0)
HEMOGLOBIN: 9.5 g/dL — AB (ref 13.0–18.0)
MCH: 35.4 pg — ABNORMAL HIGH (ref 26.0–34.0)
MCHC: 35.1 g/dL (ref 32.0–36.0)
MCV: 101 fL — AB (ref 80.0–100.0)
PLATELETS: 185 10*3/uL (ref 150–440)
RBC: 2.67 MIL/uL — AB (ref 4.40–5.90)
RDW: 15 % — ABNORMAL HIGH (ref 11.5–14.5)
WBC: 2.6 10*3/uL — AB (ref 3.8–10.6)

## 2017-08-29 LAB — MAGNESIUM: Magnesium: 1.6 mg/dL — ABNORMAL LOW (ref 1.7–2.4)

## 2017-08-29 LAB — IRON AND TIBC: Iron: 73 ug/dL (ref 45–182)

## 2017-08-29 LAB — RETICULOCYTES
RBC.: 2.52 MIL/uL — ABNORMAL LOW (ref 4.40–5.90)
Retic Count, Absolute: 30.2 10*3/uL (ref 19.0–183.0)
Retic Ct Pct: 1.2 % (ref 0.4–3.1)

## 2017-08-29 LAB — PHOSPHORUS: Phosphorus: 2.3 mg/dL — ABNORMAL LOW (ref 2.5–4.6)

## 2017-08-29 LAB — FERRITIN: Ferritin: 314 ng/mL (ref 24–336)

## 2017-08-29 MED ORDER — ENOXAPARIN SODIUM 30 MG/0.3ML ~~LOC~~ SOLN
30.0000 mg | SUBCUTANEOUS | Status: DC
  Administered 2017-08-29: 30 mg via SUBCUTANEOUS
  Filled 2017-08-29: qty 0.3

## 2017-08-29 MED ORDER — OXYCODONE HCL 5 MG PO TABS
5.0000 mg | ORAL_TABLET | ORAL | Status: DC | PRN
  Administered 2017-08-29 – 2017-08-30 (×3): 5 mg via ORAL
  Filled 2017-08-29 (×3): qty 1

## 2017-08-29 MED ORDER — POTASSIUM PHOSPHATE MONOBASIC 500 MG PO TABS
500.0000 mg | ORAL_TABLET | Freq: Three times a day (TID) | ORAL | Status: DC
  Administered 2017-08-29 – 2017-08-30 (×3): 500 mg via ORAL
  Filled 2017-08-29 (×4): qty 1

## 2017-08-29 MED ORDER — MAGNESIUM OXIDE 400 (241.3 MG) MG PO TABS
400.0000 mg | ORAL_TABLET | Freq: Every day | ORAL | Status: AC
  Administered 2017-08-29 – 2017-08-30 (×2): 400 mg via ORAL
  Filled 2017-08-29 (×2): qty 1

## 2017-08-29 NOTE — Progress Notes (Signed)
lovenox decreased to 30mg  daily due to low body weight  Philip [name] D Sibyl Mikula, Pharm.D, BCPS Clinical Pharmacist

## 2017-08-29 NOTE — Progress Notes (Signed)
[facility] Hematology/Oncology Progress Note  Date of admission: 08/26/2017  Hospital day:  08/29/2017  Chief Complaint: Philip [name] is a 57 y.o. male with extensive stage small cell lung cancer who was admitted through the emergency room with nausea, vomiting, dehydration and electrolyte abnormalities.  Subjective:  Doing well today.  Drinking liquids well.  Tolerated grits this morning.  Plans to try macaroni and cheese and meat loaf for dinner.  No nausea or vomiting.  No diarrhea.  Social History: The patient is accompanied by his wife today.  Allergies:  Allergies  Allergen Reactions  . Tylenol [Acetaminophen] Palpitations  . Amitriptyline Other (See Comments)    Burning sensation all over  . Aspirin   . Contrast Media [Iodinated Diagnostic Agents] Other (See Comments)    Pt states that he got "knots behind his ears"  . Nsaids Other (See Comments)    Blood in stools  . Ibuprofen Other (See Comments) and Nausea Only    Blood in stools  . Tape Rash and Other (See Comments)    PLEASE USE COBAN WRAP; THE PATIENT'S SKIN TEARS WHEN BANDAGES ARE REMOVED!!    Scheduled Medications: . Chlorhexidine Gluconate Cloth  6 each Topical Q0600  . clopidogrel  75 mg Oral Daily  . enoxaparin (LOVENOX) injection  30 mg Subcutaneous Q24H  . famotidine  20 mg Oral BID  . feeding supplement  1 Container Oral TID BM  . feeding supplement (ENSURE ENLIVE)  237 mL Oral TID BM  . folic acid  1 mg Oral Daily  . magnesium oxide  400 mg Oral Daily  . metoCLOPramide  5 mg Oral TID AC & HS  . metoprolol tartrate  25 mg Oral BID  . multivitamin with minerals  1 tablet Oral Daily  . mupirocin ointment  1 application Nasal BID  . OLANZapine zydis  10 mg Oral QHS  . potassium phosphate (monobasic)  500 mg Oral TID WC & HS  . predniSONE  40 mg Oral Q breakfast  . sodium chloride flush  10-40 mL Intracatheter Q12H  . thiamine  100 mg Oral Daily  . vitamin C  500 mg Oral BID     Review of Systems: GENERAL:  Fatigue.  No fevers, sweats.  Weight loss of 40 pounds in past 6 months. PERFORMANCE STATUS (ECOG):  2 HEENT:  No visual changes, runny nose, sore throat, mouth sores or tenderness.  No epistaxis. Lungs: No shortness of breath or cough.  No hemoptysis. Cardiac:  No chest pain, palpitations, orthopnea, or PND. GI:  No nausea or vomiting today.  No diarrhea, constipation, melena or hematochezia. GU:  No urgency, frequency, dysuria, or hematuria. Musculoskeletal:  Chronic low back pain.  No joint pain.  No muscle tenderness. Extremities:  Lower extremity pain secondary to PVD.  No swelling. Skin:  No new areas of bruising.  No bleeding.  No rashes or skin changes. Neuro:  General weakness.  No headache, numbness or focal weakness, balance or coordination issues. Endocrine:  No diabetes, thyroid issues, hot flashes or night sweats. Psych:  No mood changes, depression or anxiety. Pain:  Lower rib discomfort, improved. Review of systems:  All other systems reviewed and found to be negative.  Physical Exam: Blood pressure 137/85, pulse 77, temperature 98 F (36.7 C), resp. rate 18, height '5\' 11"'  (1.803 m), weight 104 lb 4.8 oz (47.3 kg), SpO2 100 %.  GENERAL:  Cachectic gentleman sitting up on side of  bed comfortably on the medical  unit in no acute distress. MENTAL STATUS:  Alert and oriented to person, place and time. HEAD:  Shaved head.  Normocephalic, atraumatic, face symmetric, no Cushingoid features. EYES:  Brown eyes.  Pupils equal round and reactive to light and accomodation.  No conjunctivitis or scleral icterus. ENT:  Oropharynx clear without lesion.  No thrush.  Tongue normal. Mucous membranes moist.  No palatal petechiae. RESPIRATORY:  Clear to auscultation without rales, wheezes or rhonchi. CARDIOVASCULAR:  Regular rate and rhythm without murmur, rub or gallop. ABDOMEN:  Scaphoid.  Soft, non-tender, with active bowel sounds, and no  hepatosplenomegaly.  No masses. SKIN:  Several confluent areas of non-palpable purpura involving distal upper extremities and upper chest, stable.  Back with multiple old small bruises.  No ecchymosis or petechiae on legs, abdomen, or lower chest.  No rashes, ulcers or lesions. EXTREMITIES: No edema, no skin discoloration or tenderness.  No palpable cords. NEUROLOGICAL: Unremarkable. PSYCH:  Appropriate.   Results for orders placed or performed during the hospital encounter of 08/26/17 (from the past 48 hour(s))  Cortisol     Status: None   Collection Time: 08/28/17  3:29 AM  Result Value Ref Range   Cortisol, Plasma 4.5 ug/dL    Comment: (NOTE) AM    6.7 - 22.6 ug/dL PM   <10.0       ug/dL Performed at [facility] [address], [place], [place] [address]   TSH     Status: None   Collection Time: 08/28/17  3:29 AM  Result Value Ref Range   TSH 0.473 0.350 - 4.500 uIU/mL    Comment: Performed by a 3rd Generation assay with a functional sensitivity of <=0.01 uIU/mL. Performed at [facility], [address] [place], [place] [address]   T4, free     Status: None   Collection Time: 08/28/17  3:29 AM  Result Value Ref Range   Free T4 1.47 0.82 - 1.77 ng/dL    Comment: (NOTE) Biotin ingestion may interfere with free T4 tests. If the results are inconsistent with the TSH level, previous test results, or the clinical presentation, then consider biotin interference. If needed, order repeat testing after stopping biotin. Performed at [facility], [address] [place], [place] [address]   CBC     Status: Abnormal   Collection Time: 08/28/17  9:54 AM  Result Value Ref Range   WBC 2.8 (L) 3.8 - 10.6 K/uL   RBC 3.26 (L) 4.40 - 5.90 MIL/uL   Hemoglobin 11.7 (L) 13.0 - 18.0 g/dL   HCT 33.1 (L) 40.0 - 52.0 %   MCV 101.5 (H) 80.0 - 100.0 fL   MCH 36.0 (H) 26.0 - 34.0 pg   MCHC 35.4 32.0 - 36.0 g/dL   RDW 15.0 (H) 11.5 - 14.5 %   Platelets 203 150 - 440  K/uL    Comment: Performed at [facility], [address] [place], [place] [address]  Basic metabolic panel     Status: Abnormal   Collection Time: 08/28/17  9:54 AM  Result Value Ref Range   Sodium 128 (L) 135 - 145 mmol/L   Potassium 4.5 3.5 - 5.1 mmol/L   Chloride 95 (L) 101 - 111 mmol/L   CO2 26 22 - 32 mmol/L   Glucose, Bld 136 (H) 65 - 99 mg/dL   BUN 8 6 - 20 mg/dL   Creatinine, Ser 0.80 0.61 - 1.24 mg/dL   Calcium 8.3 (L) 8.9 - 10.3 mg/dL  GFR calc non Af Amer >60 >60 mL/min   GFR calc Af Amer >60 >60 mL/min    Comment: (NOTE) The eGFR has been calculated using the CKD EPI equation. This calculation has not been validated in all clinical situations. eGFR's persistently <60 mL/min signify possible Chronic Kidney Disease.    Anion gap 7 5 - 15    Comment: Performed at [facility], [address] [place], [place] [address]  Protime-INR     Status: None   Collection Time: 08/28/17  9:54 AM  Result Value Ref Range   Prothrombin Time 12.0 11.4 - 15.2 seconds   INR 0.89     Comment: Performed at [facility], [address] [place], [place] [address]  CBC     Status: Abnormal   Collection Time: 08/29/17  5:33 AM  Result Value Ref Range   WBC 2.6 (L) 3.8 - 10.6 K/uL   RBC 2.67 (L) 4.40 - 5.90 MIL/uL   Hemoglobin 9.5 (L) 13.0 - 18.0 g/dL   HCT 27.0 (L) 40.0 - 52.0 %   MCV 101.0 (H) 80.0 - 100.0 fL   MCH 35.4 (H) 26.0 - 34.0 pg   MCHC 35.1 32.0 - 36.0 g/dL   RDW 15.0 (H) 11.5 - 14.5 %   Platelets 185 150 - 440 K/uL    Comment: Performed at [facility], [address] [place], [place] [address]  Basic metabolic panel     Status: Abnormal   Collection Time: 08/29/17  5:33 AM  Result Value Ref Range   Sodium 130 (L) 135 - 145 mmol/L   Potassium 4.0 3.5 - 5.1 mmol/L   Chloride 97 (L) 101 - 111 mmol/L   CO2 26 22 - 32 mmol/L   Glucose, Bld 105 (H) 65 - 99 mg/dL   BUN 8 6 - 20 mg/dL   Creatinine, Ser 0.88 0.61 - 1.24 mg/dL    Calcium 8.4 (L) 8.9 - 10.3 mg/dL   GFR calc non Af Amer >60 >60 mL/min   GFR calc Af Amer >60 >60 mL/min    Comment: (NOTE) The eGFR has been calculated using the CKD EPI equation. This calculation has not been validated in all clinical situations. eGFR's persistently <60 mL/min signify possible Chronic Kidney Disease.    Anion gap 7 5 - 15    Comment: Performed at [facility], [address] [place], [place] [address]  Phosphorus     Status: Abnormal   Collection Time: 08/29/17  5:33 AM  Result Value Ref Range   Phosphorus 2.3 (L) 2.5 - 4.6 mg/dL    Comment: Performed at [facility], [address] [place], [place] [address]  Magnesium     Status: Abnormal   Collection Time: 08/29/17  5:33 AM  Result Value Ref Range   Magnesium 1.6 (L) 1.7 - 2.4 mg/dL    Comment: Performed at [facility], [address] [place], [place] [address]  Ferritin     Status: None   Collection Time: 08/29/17  5:33 AM  Result Value Ref Range   Ferritin 314 24 - 336 ng/mL    Comment: Performed at [facility], [address] [place], [place] [address]  Iron and TIBC     Status: None   Collection Time: 08/29/17  5:33 AM  Result Value Ref Range   Iron 73 45 - 182 ug/dL   TIBC NOT CALCULATED 250 - 450 ug/dL   Saturation Ratios NOT CALCULATED 17.9 -  39.5 %   UIBC NOT CALCULATED ug/dL    Comment: Performed at [facility], [place], [place] [address]  Reticulocytes     Status: Abnormal   Collection Time: 08/29/17  5:33 AM  Result Value Ref Range   Retic Ct Pct 1.2 0.4 - 3.1 %   RBC. 2.52 (L) 4.40 - 5.90 MIL/uL   Retic Count, Absolute 30.2 19.0 - 183.0 K/uL    Comment: Performed at [facility], [place], [place] [address]   Mr Brain 61 Contrast  Result Date: 08/28/2017 CLINICAL DATA:  57 year old male with history of stage IV metastatic lung cancer with headaches, nausea, and vomiting. Evaluate for  intracranial metastatic disease. EXAM: MRI HEAD WITHOUT CONTRAST TECHNIQUE: Multiplanar, multiecho pulse sequences of the brain and surrounding structures were obtained without intravenous contrast. COMPARISON:  Prior head CT from 06/21/2017. FINDINGS: Brain: Generalized age-related cerebral atrophy. Patchy and confluent T2/FLAIR hyperintensity within the periventricular white matter most consistent with chronic small vessel ischemic change, moderate nature. Small vessel changes present within the pons as well. 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 No evidence for acute infarct. Gray-white matter differentiation otherwise maintained. No evidence for acute or chronic intracranial hemorrhage. No mass lesion, midline shift or mass effect. No evidence for intracranial metastasis on this noncontrast examination. No hydrocephalus. No extra-axial fluid collection. Major dural sinuses are grossly patent. Pituitary gland suprasellar region normal. Vascular: Major intracranial vascular flow voids are maintained. Skull and upper cervical spine: Craniocervical junction within normal limits. Upper cervical spine unremarkable. Visualized bone marrow within normal limits. No discrete osseous lesions. No scalp soft tissue abnormality. Sinuses/Orbits: Globes and orbital soft tissues within normal limits. Paranasal sinuses are largely clear. Bilateral mastoid effusions, likely benign/reactive in nature. Inner ear structures grossly normal. Other: None. IMPRESSION: 1. No intracranial metastases identified. Please note evaluation somewhat limited given lack of IV contrast, with limited evaluation for possible small/early metastatic lesions. 2. No other acute intracranial abnormality. 3. Subcentimeter probable late subacute to chronic  right frontal cortical infarct as above. 4. Moderate chronic small vessel ischemic change. Electronically Signed   By: Jeannine Boga M.D.   On: 08/28/2017 13:43    Assessment:  Philip [name] is a 57 y.o. male with extensive stage small cell lung cancer and chronic nausea and vomiting of unclear etiology.  He has lost 40 pounds in the past 6 months.  PET scan on 04/26/2017 revealed no active disease.  UGI on 06/14/2017 revealed no evidence of gastroparesis.  EGD on 08/08/2017 revealed no lesion or obstruction.  Head CT without contrast on 06/21/2017 revealed no metastatic disease.  Head MRI on 08/28/2017 without acute abnormality.  He began prednisone 40 mg a day on 08/27/2017.  He has adrenal insufficiency.  Symptomatically, he denies any nausea or vomiting today.  He is drinking liquids and trying some soft food.    Plan:   1.  Oncology:  Patient with extensive stage small cell lung cancer.  Concern always exists for recurrent disease, although last PET scan in 04/2017, revealed no evidence of active disease.  Anticipate outpatient follow-up PET scan if nausea/voimiting and associated weight loss unresolved.  2.  Hematology:  Hemoglobin 11.7 to 9.5 overnight.  No bleeding.  Guaiac stools.  Ferritin 314.  Retic 1.2% (low).  WBC  2600.  Folate low on supplementation.  B12 and TSH normal.  Platelet count normal.  Check CBC in AM.  3.  Gastroenterology:  Patient admitted with persistent nausea and vomiting without evidence of gastroparesis or obstruction. Lipase normal.  Head MRI without contrast to r/o CNS disease was negative today. Scheduled antiemetics and PPI BID continue.  Work-up to r/o endocrine disorder revealed adrenal insufficiency (low cortisol).  Clinically, he has improved dramatically after the past 2 days. Tube feeds currently on hold.  4.  Dermatology:  Patient with small areas of ecchymosis on admission.  New confluent areas on chest and lower arms yesterday (stable  today).  No bleeding.  Platelet count remains normal.  PT normal.  Patient on Plavix at home for known CAD/PVD.  Lovenox added to DVT prophylaxis.  Risk of bleeding with Lovenox may be increased secondary to patient's low weight.  5.  Fluids/Electrolytes/Nutrition:  Oral intake has improved.  CrCl 62 ml/min.  Folate deficiency being supplemented.  Thiamine and vitamin C added yesterday.  Appreciate Nutrition consult.  High risk refeeding (monitoring K, Mg, and Phos).  Slowly advancing diet.  Tube feeds postponed.  I am out of the office tomorrow.  If any questions, please contact Dr. Grayland Ormond.  Anticipate follow-up in clinic next week.   Lequita Asal, MD  08/29/2017, 8:23 PM

## 2017-08-29 NOTE — Progress Notes (Signed)
[facility] NAME: Philip [name]    MR#:  [id number]  DATE OF BIRTH:  Aug 12, 1960  SUBJECTIVE:  CHIEF COMPLAINT:   Chief Complaint  Patient presents with  . Chest Pain    has chronic complaining of vomiting and nausea and not able to tolerate oral food came with generalized weakness and noted to have electrolyte imbalance. Have rashes/ echymosis/ patechie on both arms, abd, back . Nausea and vomit is better and he tolerated full liquid diet.  REVIEW OF SYSTEMS:  CONSTITUTIONAL: No fever, have fatigue or weakness.  EYES: No blurred or double vision.  EARS, NOSE, AND THROAT: No tinnitus or ear pain.  RESPIRATORY: No cough, shortness of breath, wheezing or hemoptysis.  CARDIOVASCULAR: No chest pain, orthopnea, edema.  GASTROINTESTINAL: have nausea, vomiting, no diarrhea or abdominal pain.  GENITOURINARY: No dysuria, hematuria.  ENDOCRINE: No polyuria, nocturia,  HEMATOLOGY: No anemia, have bruising ,no bleeding SKIN: No rash or lesion. MUSCULOSKELETAL: No joint pain or arthritis.   NEUROLOGIC: No tingling, numbness, weakness.  PSYCHIATRY: No anxiety or depression.   ROS  DRUG ALLERGIES:   Allergies  Allergen Reactions  . Tylenol [Acetaminophen] Palpitations  . Amitriptyline Other (See Comments)    Burning sensation all over  . Aspirin   . Contrast Media [Iodinated Diagnostic Agents] Other (See Comments)    Pt states that he got "knots behind his ears"  . Nsaids Other (See Comments)    Blood in stools  . Ibuprofen Other (See Comments) and Nausea Only    Blood in stools  . Tape Rash and Other (See Comments)    PLEASE USE COBAN WRAP; THE PATIENT'S SKIN TEARS WHEN BANDAGES ARE REMOVED!!    VITALS:  Blood pressure 112/73, pulse 78, temperature (!) 97.5 F (36.4 C), temperature source Oral, resp. rate 16, height 5\' 11"  (1.803 m), weight 47.3 kg (104 lb 4.8 oz), SpO2 100 %.  PHYSICAL EXAMINATION:  GENERAL:  57  y.o.-year-old thin, malnourished patient lying in the bed with no acute distress.  EYES: Pupils equal, round, reactive to light and accommodation. No scleral icterus. Extraocular muscles intact.  HEENT: Head atraumatic, normocephalic. Oropharynx and nasopharynx clear.  NECK:  Supple, no jugular venous distention. No thyroid enlargement, no tenderness.  LUNGS: Normal breath sounds bilaterally, no wheezing, rales,rhonchi or crepitation. No use of accessory muscles of respiration.  CARDIOVASCULAR: S1, S2 normal. No murmurs, rubs, or gallops.  ABDOMEN: Soft, nontender, nondistended. Bowel sounds present. No organomegaly or mass.  EXTREMITIES: No pedal edema, cyanosis, or clubbing. Muscle wasting. NEUROLOGIC: Cranial nerves II through XII are intact. Muscle strength 4/5 in all extremities. Sensation intact. Gait not checked.  PSYCHIATRIC: The patient is alert and oriented x 3.  SKIN: on both arms, abd and back have echymotic and patechial lesions, non blanching, no itching, no active bleed.   Physical Exam LABORATORY PANEL:   CBC Recent Labs  Lab 08/29/17 0533  WBC 2.6*  HGB 9.5*  HCT 27.0*  PLT 185   ------------------------------------------------------------------------------------------------------------------  Chemistries  Recent Labs  Lab 08/26/17 2127  08/29/17 0533  NA 129*   < > 130*  K 2.5*   < > 4.0  CL 84*   < > 97*  CO2 17*   < > 26  GLUCOSE 85   < > 105*  BUN 14   < > 8  CREATININE 1.19   < > 0.88  CALCIUM 8.9   < > 8.4*  MG  --    < >  1.6*  AST 29  --   --   ALT 15*  --   --   ALKPHOS 79  --   --   BILITOT 2.1*  --   --    < > = values in this interval not displayed.   ------------------------------------------------------------------------------------------------------------------  Cardiac Enzymes Recent Labs  Lab 08/26/17 2127  TROPONINI <0.03    ------------------------------------------------------------------------------------------------------------------  RADIOLOGY:  Mr Brain Wo Contrast  Result Date: 08/28/2017 CLINICAL DATA:  57 year old male with history of stage IV metastatic lung cancer with headaches, nausea, and vomiting. Evaluate for intracranial metastatic disease. EXAM: MRI HEAD WITHOUT CONTRAST TECHNIQUE: Multiplanar, multiecho pulse sequences of the brain and surrounding structures were obtained without intravenous contrast. COMPARISON:  Prior head CT from 06/21/2017. FINDINGS: Brain: Generalized age-related cerebral atrophy. Patchy and confluent T2/FLAIR hyperintensity within the periventricular white matter most consistent with chronic small vessel ischemic change, moderate nature. Small vessel changes present within the pons as well. 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 No evidence for acute infarct. Gray-white matter differentiation otherwise maintained. No evidence for acute or chronic intracranial hemorrhage. No mass lesion, midline shift or mass effect. No evidence for intracranial metastasis on this noncontrast examination. No hydrocephalus. No extra-axial fluid collection. Major dural sinuses are grossly patent. Pituitary gland suprasellar region normal. Vascular: Major intracranial vascular flow voids are maintained. Skull and upper cervical spine: Craniocervical junction within normal limits. Upper cervical spine unremarkable. Visualized bone marrow within normal limits. No discrete osseous lesions. No scalp soft tissue abnormality. Sinuses/Orbits: Globes and orbital soft tissues within normal limits. Paranasal sinuses are largely clear. Bilateral mastoid effusions, likely benign/reactive in nature. Inner ear  structures grossly normal. Other: None. IMPRESSION: 1. No intracranial metastases identified. Please note evaluation somewhat limited given lack of IV contrast, with limited evaluation for possible small/early metastatic lesions. 2. No other acute intracranial abnormality. 3. 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 4. Moderate chronic small vessel ischemic change. Electronically Signed   By: Jeannine Boga M.D.   On: 08/28/2017 13:43    ASSESSMENT AND PLAN:   Active Problems:   Intractable nausea and vomiting   Protein-calorie malnutrition, severe  * severe malnutrition   Due to recurrent nausea and vomiting   He had been seen by GI and EGD was done which was negative except for gastritis.   He had tried antiemetic medications multiple times in the past and advised by oncologist.   [facility] help for Dobhoff tube placement, but decided not to place the tube as patient is improving with medical intervention and upgrading the diet.  * hyponatremia   Hypokalemia   Acute renal insufficiency    This is secondary to decreased oral intake and dehydration.   IV fluids, consultGI and oncology to help making further plan to avoid this happening again.   Appreciated help, added scheduled antiemetic medicine, Zyprexa, steroid.   Initially the plan was to place a nasojejunal tube, but as patient is improving now and tolerating diet, continue to monitor.  * macrocytosis anemia    This could be secondary to malnutrition   Vitamin supplements, B12 level is normal.  * metastatic lung cancer   Oncology consult to help further management. As per patient he was in remission.   As persistent nausea, suggest to check MRI  brain. Negative.  * Recent gastritis   Continue pantoprazole.  * Coronary artery disease   COntinue metoprolol, Plavix.  * Skin lesions    Echymosis and patechial   Platelet are normal   INR is normal.   May be due to  vitamin deficiency- start replacing Vit C and thiamine.  * Adrenal insufficiency suspected   Oral prednisone.    His cortisol level was checked after he received 40 mg prednisone dose the previous evening.   I spoke to endocrinologist in the clinic [facility]) Dr. Bing Plume- he suggested, patient's cortisol could Low even with 1 dose of prednisone on the previous day that is reactive. t does not seem like adrenal insufficiency, but he can see the patient in the office if any concern.  All the records are reviewed and case discussed with Care Management/Social Workerr. Management plans discussed with the patient, family and they are in agreement.  CODE STATUS: Full.  TOTAL TIME TAKING CARE OF THIS PATIENT: 35 minutes.   Wife in room during my visit.  POSSIBLE D/C IN 1-2 DAYS, DEPENDING ON CLINICAL CONDITION.   Vaughan Basta M.D on 08/29/2017   Between 7am to 6pm - Pager - [phone]  After 6pm go to [email or web address] - password [email or web address] [facility]  Office  [phone]  CC: Primary care physician; Lorelee Market, MD  Note: This dictation was prepared with Dragon dictation along with smaller phrase technology. Any transcriptional errors that result from this process are unintentional.

## 2017-08-29 NOTE — Consult Note (Signed)
MEDICATION RELATED CONSULT NOTE - INITIAL   Pharmacy Consult for electrolytes Indication: electrolyte imbalance, risk of refeeding  Allergies  Allergen Reactions  . Tylenol [Acetaminophen] Palpitations  . Amitriptyline Other (See Comments)    Burning sensation all over  . Aspirin   . Contrast Media [Iodinated Diagnostic Agents] Other (See Comments)    Pt states that he got "knots behind his ears"  . Nsaids Other (See Comments)    Blood in stools  . Ibuprofen Other (See Comments) and Nausea Only    Blood in stools  . Tape Rash and Other (See Comments)    PLEASE USE COBAN WRAP; THE PATIENT'S SKIN TEARS WHEN BANDAGES ARE REMOVED!!    Patient Measurements: Height: 5\' 11"  (180.3 cm) Weight: 104 lb 4.8 oz (47.3 kg) IBW/kg (Calculated) : 75.3 Adjusted Body Weight:   Vital Signs: Temp: 97.5 F (36.4 C) (05/16 0908) Temp Source: Oral (05/16 0908) BP: 112/73 (05/16 0943) Pulse Rate: 78 (05/16 0943) Intake/Output from previous day: 05/15 0701 - 05/16 0700 In: 20 [I.V.:20] Out: 1000 [Urine:1000] Intake/Output from this shift: Total I/O In: -  Out: 350 [Urine:350]  Labs: Recent Labs    08/26/17 2127 08/27/17 0425 08/28/17 0954 08/29/17 0533  WBC 4.5 2.5* 2.8* 2.6*  HGB 13.9 11.3* 11.7* 9.5*  HCT 38.6* 31.8* 33.1* 27.0*  PLT 232 180 203 185  CREATININE 1.19 0.97 0.80 0.88  MG  --  3.4*  --  1.6*  PHOS  --  3.2  --  2.3*  ALBUMIN 3.1*  --   --   --   PROT 6.4*  --   --   --   AST 29  --   --   --   ALT 15*  --   --   --   ALKPHOS 79  --   --   --   BILITOT 2.1*  --   --   --    Estimated Creatinine Clearance: 62 mL/min (by C-G formula based on SCr of 0.88 mg/dL).   Microbiology: Recent Results (from the past 720 hour(s))  MRSA PCR Screening     Status: Abnormal   Collection Time: 08/27/17  8:31 AM  Result Value Ref Range Status   MRSA by PCR POSITIVE (A) NEGATIVE Final    Comment:        The GeneXpert MRSA Assay (FDA approved for NASAL specimens only), is  one component of a comprehensive MRSA colonization surveillance program. It is not intended to diagnose MRSA infection nor to guide or monitor treatment for MRSA infections. RESULT CALLED TO, READ BACK BY AND VERIFIED WITH: BERENICE ALEJO CALDERON AT 1003 ON 08/27/17 [place]. Performed at [facility], [address] [place], [place] [address]     Medical History: Past Medical History:  Diagnosis Date  . Anemia   . CAD (coronary artery disease)    a. 08/2014 Inf STEMI/CABG x 3 (LIMA->LAD, VG->Diag, RIMA->RCA).  . DVT, recurrent, lower extremity, acute, left ([place]) 2016  . GIB (gastrointestinal bleeding)    a. In the setting of DAPT-->tolerating plavix only.  . Hyperlipidemia   . Hypertensive heart disease   . Lung cancer ([place])    a. s/p chemo/radiation.  Marland Kitchen PAD (peripheral artery disease) ([place])    a. 03/2015 Periph Angio: short occlusion of L pop w/ evidence of embolization into the DP->5.0x50 mm Inova self-expanding stent;  b. 04/08/2015 ABI: R 1.12, L 0.99; c. 01/2017 LE Duplex: bilat SFA obstructive dzs, patent L pop.  . Tobacco  abuse   . 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 Chlorhexidine Gluconate Cloth  6 each Topical Q0600  . clopidogrel  75 mg Oral Daily  . enoxaparin (LOVENOX) injection  30 mg Subcutaneous Q24H  . famotidine  20 mg Oral BID  . feeding supplement  1 Container Oral TID BM  . feeding supplement (ENSURE ENLIVE)  237 mL Oral TID BM  . folic acid  1 mg Oral Daily  . magnesium oxide  400 mg Oral Daily  . metoCLOPramide  5 mg Oral TID AC & HS  . metoprolol tartrate  25 mg Oral BID  . multivitamin with minerals  1 tablet Oral Daily  . mupirocin ointment  1 application Nasal BID  . OLANZapine zydis  10 mg Oral QHS  . potassium phosphate (monobasic)  500 mg Oral TID WC & HS  . predniSONE  40 mg Oral Q breakfast  . sodium chloride flush  10-40 mL Intracatheter Q12H  . thiamine  100 mg Oral Daily  .  vitamin C  500 mg Oral BID    Assessment: Patient is a 57 year old male with extensive stage small cell lung cancer who was admitted with chronic n/v dehydration and malnutrition. Pt now able to tolerate full liquid diet and diet has been advanced. Pt is at risk of refeeding.  K=4 Mg=1.7 Phos=2.3  Plan:  Mag ox 400mg  daily x 2 Kphos 500mg  TID w/ meals x 4 doses Recheck in AM  Melissa D Maccia, Pharm.D, BCPS Clinical Pharmacist  08/29/2017,1:25 PM

## 2017-08-29 NOTE — Progress Notes (Signed)
Patient tolerating full liquid diet and requesting to advance. No nausea or vomiting. Advance to soft diet per Dr. Anselm Jungling.

## 2017-08-29 NOTE — Plan of Care (Signed)
  Problem: Clinical Measurements: Goal: Ability to maintain clinical measurements within normal limits will improve Outcome: Progressing Goal: Diagnostic test results will improve Outcome: Progressing   Problem: Pain Managment: Goal: General experience of comfort will improve Outcome: Progressing

## 2017-08-29 NOTE — Progress Notes (Signed)
Philip Darby, MD [address]  [place]  [place], [place] [address]  Main: [phone]  Fax: [phone] Pager: [phone]   Subjective: Patient is feeling significantly better. He reports that his appetite is back and able to tolerate soft diet today. His pain is better controlled.   Objective: Vital signs in last 24 hours: Vitals:   08/29/17 0449 08/29/17 0908 08/29/17 0943 08/29/17 1738  BP: 111/76 91/68 112/73 (!) 133/91  Pulse: 73 72 78 81  Resp: 16 16  14   Temp: 98 F (36.7 C) (!) 97.5 F (36.4 C)    TempSrc: Oral Oral    SpO2: 100% 100%  99%  Weight:      Height:       Weight change:   Intake/Output Summary (Last 24 hours) at 08/29/2017 1933 Last data filed at 08/29/2017 1742 Gross per 24 hour  Intake 10 ml  Output 1550 ml  Net -1540 ml     Exam: Heart:: Regular rate and rhythm or S1S2 present Lungs: normal and clear to auscultation Abdomen: soft, nontender, normal bowel sounds   Lab Results: CBC Latest Ref Rng & Units 08/29/2017 08/28/2017 08/27/2017  WBC 3.8 - 10.6 K/uL 2.6(L) 2.8(L) 2.5(L)  Hemoglobin 13.0 - 18.0 g/dL 9.5(L) 11.7(L) 11.3(L)  Hematocrit 40.0 - 52.0 % 27.0(L) 33.1(L) 31.8(L)  Platelets 150 - 440 K/uL 185 203 180   CMP Latest Ref Rng & Units 08/29/2017 08/28/2017 08/27/2017  Glucose 65 - 99 mg/dL 105(H) 136(H) 79  BUN 6 - 20 mg/dL 8 8 12   Creatinine 0.61 - 1.24 mg/dL 0.88 0.80 0.97  Sodium 135 - 145 mmol/L 130(L) 128(L) 130(L)  Potassium 3.5 - 5.1 mmol/L 4.0 4.5 3.4(L)  Chloride 101 - 111 mmol/L 97(L) 95(L) 92(L)  CO2 22 - 32 mmol/L 26 26 24   Calcium 8.9 - 10.3 mg/dL 8.4(L) 8.3(L) 8.0(L)  Total Protein 6.5 - 8.1 g/dL - - -  Total Bilirubin 0.3 - 1.2 mg/dL - - -  Alkaline Phos 38 - 126 U/L - - -  AST 15 - 41 U/L - - -  ALT 17 - 63 U/L - - -   Micro Results: Recent Results (from the past 240 hour(s))  MRSA PCR Screening     Status: Abnormal   Collection Time: 08/27/17  8:31 AM  Result Value Ref Range Status   MRSA  by PCR POSITIVE (A) NEGATIVE Final    Comment:        The GeneXpert MRSA Assay (FDA approved for NASAL specimens only), is one component of a comprehensive MRSA colonization surveillance program. It is not intended to diagnose MRSA infection nor to guide or monitor treatment for MRSA infections. RESULT CALLED TO, READ BACK BY AND VERIFIED WITH: BERENICE ALEJO CALDERON AT 1003 ON 08/27/17 [place]. Performed at [facility], [place], [place] [address]    Studies/Results: Mr Herby Abraham Contrast  Result Date: 08/28/2017 CLINICAL DATA:  57 year old male with history of stage IV metastatic lung cancer with headaches, nausea, and vomiting. Evaluate for intracranial metastatic disease. EXAM: MRI HEAD WITHOUT CONTRAST TECHNIQUE: Multiplanar, multiecho pulse sequences of the brain and surrounding structures were obtained without intravenous contrast. COMPARISON:  Prior head CT from 06/21/2017. FINDINGS: Brain: Generalized age-related cerebral atrophy. Patchy and confluent T2/FLAIR hyperintensity within the periventricular white matter most consistent with chronic small vessel ischemic change, moderate nature. Small vessel changes present within the pons as well. Small 7 mm focus of T2/FLAIR hyperintensity involving the cortical gray  matter of the posterior right frontal region with mild residual diffusion abnormality, favored to reflect a late subacute to chronic ischemic infarct (series 8, image 46). No evidence for acute infarct. Gray-white matter differentiation otherwise maintained. No evidence for acute or chronic intracranial hemorrhage. No mass lesion, midline shift or mass effect. No evidence for intracranial metastasis on this noncontrast examination. No hydrocephalus. No extra-axial fluid collection. Major dural sinuses are grossly patent. Pituitary gland suprasellar region normal. Vascular: Major intracranial vascular flow voids are maintained. Skull and upper cervical spine:  Craniocervical junction within normal limits. Upper cervical spine unremarkable. Visualized bone marrow within normal limits. No discrete osseous lesions. No scalp soft tissue abnormality. Sinuses/Orbits: Globes and orbital soft tissues within normal limits. Paranasal sinuses are largely clear. Bilateral mastoid effusions, likely benign/reactive in nature. Inner ear structures grossly normal. Other: None. IMPRESSION: 1. No intracranial metastases identified. Please note evaluation somewhat limited given lack of IV contrast, with limited evaluation for possible small/early metastatic lesions. 2. No other acute intracranial abnormality. 3. 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 4. Moderate chronic small vessel ischemic change. Electronically Signed   By: Jeannine Boga M.D.   On: 08/28/2017 13:43   Medications: I have reviewed the patient's current medications. Scheduled Meds: . Chlorhexidine Gluconate Cloth  6 each Topical Q0600  . clopidogrel  75 mg Oral Daily  . enoxaparin (LOVENOX) injection  30 mg Subcutaneous Q24H  . famotidine  20 mg Oral BID  . feeding supplement  1 Container Oral TID BM  . feeding supplement (ENSURE ENLIVE)  237 mL Oral TID BM  . folic acid  1 mg Oral Daily  . magnesium oxide  400 mg Oral Daily  . metoCLOPramide  5 mg Oral TID AC & HS  . metoprolol tartrate  25 mg Oral BID  . multivitamin with minerals  1 tablet Oral Daily  . mupirocin ointment  1 application Nasal BID  . OLANZapine zydis  10 mg Oral QHS  . potassium phosphate (monobasic)  500 mg Oral TID WC & HS  . predniSONE  40 mg Oral Q breakfast  . sodium chloride flush  10-40 mL Intracatheter Q12H  . thiamine  100 mg Oral Daily  . vitamin C  500 mg Oral BID   Continuous Infusions: PRN Meds:.albuterol, bisacodyl, diphenhydrAMINE, [DISCONTINUED]  morphine injection **OR** morphine injection, ondansetron (ZOFRAN) IV, oxyCODONE, promethazine, senna-docusate,  sodium chloride flush   Philip [name] is a 57 y.o. male with stage IV lung cancer,status post radiation and chemotherapy with approximately six-month history of bilateral subcostal chest pain associated with intractable nausea, poor by mouth intake resulting in severe protein calorie malnutrition. I suspect if his bilateral chest pain is secondary to radiation that may have resulted in some scarring/fibrosis of intrathoracic cavity  Intractable nausea, vomiting and pain have significantly improved  - Recommend adequate pain control - Zofran 4 mg and Phenergan 25 mg every 6 hours as needed - continue Zyprexa 10 mg at bedtime as outpatient - continue Reglan 5 mg before each meal and at bedtime as outpatient - Pepcid as needed - Continue prednisone 40mg  daily for 2 weeks then taper by 10 mg weekly and maintain a 20 mg at least for 4-6 weeks for intractable nausea as outpatient - I do not recommend to restart mirtazapine upon discharge as patient is currently on Zyprexa which is helping with his symptoms - popsicles every 4-6 hours - I think we can hold off on placing a  GJ tube as patient is tolerating by mouth well and he should be able to meet the caloric needs gradually - Recommend dietitian to go over a dietary plan for him upon discharge, protein supplements 3 times daily with each meal - Follow-up with me in clinic in 3-4 weeks upon discharge  Macrocytic anemia: Continue multivitamin with minerals, folate acid, vitamin C and thiamine daily  Dr. Alice Reichert to cover from tomorrow   LOS: 2 days   Rukia Mcgillivray 08/29/2017, 7:33 PM

## 2017-08-30 LAB — COMPREHENSIVE METABOLIC PANEL
ALK PHOS: 51 U/L (ref 38–126)
ALT: 14 U/L — ABNORMAL LOW (ref 17–63)
AST: 25 U/L (ref 15–41)
Albumin: 2.6 g/dL — ABNORMAL LOW (ref 3.5–5.0)
Anion gap: 9 (ref 5–15)
BUN: 7 mg/dL (ref 6–20)
CALCIUM: 8.8 mg/dL — AB (ref 8.9–10.3)
CO2: 26 mmol/L (ref 22–32)
Chloride: 100 mmol/L — ABNORMAL LOW (ref 101–111)
Creatinine, Ser: 0.86 mg/dL (ref 0.61–1.24)
GFR calc non Af Amer: >60 mL/min (ref >60)
GLUCOSE: 92 mg/dL (ref 65–99)
Potassium: 3.7 mmol/L (ref 3.5–5.1)
SODIUM: 135 mmol/L (ref 135–145)
Total Bilirubin: 0.7 mg/dL (ref 0.3–1.2)
Total Protein: 4.9 g/dL — ABNORMAL LOW (ref 6.5–8.1)

## 2017-08-30 LAB — CBC
HCT: 29.3 % — ABNORMAL LOW (ref 40.0–52.0)
Hemoglobin: 10.4 g/dL — ABNORMAL LOW (ref 13.0–18.0)
MCH: 35.9 pg — ABNORMAL HIGH (ref 26.0–34.0)
MCHC: 35.4 g/dL (ref 32.0–36.0)
MCV: 101.5 fL — AB (ref 80.0–100.0)
Platelets: 195 10*3/uL (ref 150–440)
RBC: 2.89 MIL/uL — ABNORMAL LOW (ref 4.40–5.90)
RDW: 15 % — AB (ref 11.5–14.5)
WBC: 2.6 10*3/uL — AB (ref 3.8–10.6)

## 2017-08-30 LAB — TYPE AND SCREEN
ABO/RH(D): A POS
ANTIBODY SCREEN: NEGATIVE

## 2017-08-30 LAB — PHOSPHORUS: Phosphorus: 2.4 mg/dL — ABNORMAL LOW (ref 2.5–4.6)

## 2017-08-30 LAB — MAGNESIUM: Magnesium: 1.6 mg/dL — ABNORMAL LOW (ref 1.7–2.4)

## 2017-08-30 MED ORDER — BISACODYL 5 MG PO TBEC: 5.0000 mg | DELAYED_RELEASE_TABLET | Freq: Every day | ORAL | 0 refills | Status: DC | PRN

## 2017-08-30 MED ORDER — METOCLOPRAMIDE HCL 5 MG PO TABS: 5.0000 mg | ORAL_TABLET | Freq: Three times a day (TID) | ORAL | 0 refills | Status: DC

## 2017-08-30 MED ORDER — HEPARIN SOD (PORK) LOCK FLUSH 100 UNIT/ML IV SOLN
500.0000 [IU] | Freq: Once | INTRAVENOUS | Status: AC
  Administered 2017-08-30: 500 [IU]
  Filled 2017-08-30: qty 5

## 2017-08-30 MED ORDER — ENSURE ENLIVE PO LIQD: 237.0000 mL | Freq: Three times a day (TID) | ORAL | 12 refills | Status: DC

## 2017-08-30 MED ORDER — OXYCODONE HCL 5 MG PO TABS: 5.0000 mg | ORAL_TABLET | ORAL | 0 refills | Status: DC | PRN

## 2017-08-30 MED ORDER — PREDNISONE 10 MG PO TABS: ORAL_TABLET | ORAL | 1 refills | Status: DC

## 2017-08-30 MED ORDER — ASCORBIC ACID 500 MG PO TABS: 500.0000 mg | ORAL_TABLET | Freq: Two times a day (BID) | ORAL | 1 refills | Status: DC

## 2017-08-30 MED ORDER — FOLIC ACID 1 MG PO TABS: 1.0000 mg | ORAL_TABLET | Freq: Every day | ORAL | 0 refills | Status: DC

## 2017-08-30 MED ORDER — THIAMINE HCL 100 MG PO TABS: 100.0000 mg | ORAL_TABLET | Freq: Every day | ORAL | 1 refills | Status: DC

## 2017-08-30 MED ORDER — OLANZAPINE 10 MG PO TBDP
10.0000 mg | ORAL_TABLET | Freq: Every day | ORAL | 0 refills | Status: DC
Start: 2017-08-30 — End: 2017-11-27

## 2017-08-30 MED ORDER — FAMOTIDINE 20 MG PO TABS: 20.0000 mg | ORAL_TABLET | Freq: Two times a day (BID) | ORAL | 0 refills | Status: DC

## 2017-08-30 MED ORDER — ADULT MULTIVITAMIN W/MINERALS CH: 1.0000 | ORAL_TABLET | Freq: Every day | ORAL | 1 refills | Status: DC

## 2017-08-30 MED ORDER — SENNOSIDES-DOCUSATE SODIUM 8.6-50 MG PO TABS: 1.0000 | ORAL_TABLET | Freq: Every evening | ORAL | 0 refills | Status: DC | PRN

## 2017-08-30 MED ORDER — POTASSIUM PHOSPHATE MONOBASIC 500 MG PO TABS: 500.0000 mg | ORAL_TABLET | Freq: Three times a day (TID) | ORAL | 0 refills | Status: DC

## 2017-08-30 NOTE — Progress Notes (Signed)
Notified by blood bank that type and screen was not viable because sample was hemolyzed. Per blood bank technician, this RN needed to send another lavender tube. Second type and screen drawn.   Update: This RN notified again by blood bank that RN needed to include identification paper with each blood sample. This information was not communicated with this RN prior to blood draw. This RN notified blood bank technician for lab to get blood draw to reduce incidences of port access. Will continue to monitor.  Iran Sizer M

## 2017-08-30 NOTE — Progress Notes (Signed)
This RN went in to draw patients AM and noticed that patient had removed port needle and dressing. Intact needle found lying near by patient. Port site clean and intact. Will continue to monitor.   Iran Sizer M

## 2017-08-30 NOTE — Progress Notes (Signed)
Philip [name] to be D/C'd Home per MD order.  Discussed prescriptions and follow up appointments with the patient. Prescriptions given to patient, medication list explained in detail. Pt verbalized understanding.   Allergies as of 08/30/2017      Reactions   Tylenol [acetaminophen] Palpitations   Amitriptyline Other (See Comments)   Burning sensation all over   Aspirin    Contrast Media [iodinated Diagnostic Agents] Other (See Comments)   Pt states that he got "knots behind his ears"   Nsaids Other (See Comments)   Blood in stools   Ibuprofen Other (See Comments), Nausea Only   Blood in stools   Tape Rash, Other (See Comments)   PLEASE USE COBAN WRAP; THE PATIENT'S SKIN TEARS WHEN BANDAGES ARE REMOVED!!      Medication List    STOP taking these medications   benzonatate 100 MG capsule Commonly known as:  TESSALON   buPROPion 150 MG 24 hr tablet Commonly known as:  WELLBUTRIN XL   furosemide 20 MG tablet Commonly known as:  LASIX   gabapentin 300 MG capsule Commonly known as:  NEURONTIN   lisinopril 5 MG tablet Commonly known as:  PRINIVIL,ZESTRIL   mirtazapine 7.5 MG tablet Commonly known as:  REMERON   pantoprazole 40 MG tablet Commonly known as:  PROTONIX   potassium chloride SA 20 MEQ tablet Commonly known as:  K-DUR,KLOR-CON     TAKE these medications   ascorbic acid 500 MG tablet Commonly known as:  VITAMIN C Take 1 tablet (500 mg total) by mouth 2 (two) times daily.   atorvastatin 40 MG tablet Commonly known as:  LIPITOR TAKE 1 TABLET BY MOUTH DAILY   bisacodyl 5 MG EC tablet Commonly known as:  DULCOLAX Take 1 tablet (5 mg total) by mouth daily as needed for moderate constipation.   CALCIUM + D3 PO Take 1 tablet by mouth 3 (three) times daily.   clopidogrel 75 MG tablet Commonly known as:  PLAVIX Take 1 tablet (75 mg total) by mouth daily.   diphenhydrAMINE 25 mg capsule Commonly known as:  BENADRYL Take 25 mg by mouth 2 (two) times daily  as needed for allergies.   famotidine 20 MG tablet Commonly known as:  PEPCID Take 1 tablet (20 mg total) by mouth 2 (two) times daily.   feeding supplement (ENSURE ENLIVE) Liqd Take 237 mLs by mouth 3 (three) times daily between meals.   FLOVENT HFA 220 MCG/ACT inhaler Generic drug:  fluticasone INL 2 PFS PO BID   folic acid 1 MG tablet Commonly known as:  FOLVITE Take 1 tablet (1 mg total) by mouth daily.   lidocaine-prilocaine cream Commonly known as:  EMLA Apply to affected area once What changed:    how much to take  how to take this  when to take this  additional instructions   magnesium oxide 400 (241.3 Mg) MG tablet Commonly known as:  MAG-OX Take 1 tablet (400 mg total) by mouth 3 (three) times daily.   metoCLOPramide 5 MG tablet Commonly known as:  REGLAN Take 1 tablet (5 mg total) by mouth 4 (four) times daily -  before meals and at bedtime.   metoprolol tartrate 25 MG tablet Commonly known as:  LOPRESSOR TAKE 1 TABLET BY MOUTH TWICE DAILY   multivitamin with minerals Tabs tablet Take 1 tablet by mouth daily. Start taking on:  08/31/2017   OLANZapine zydis 10 MG disintegrating tablet Commonly known as:  ZYPREXA Take 1 tablet (10 mg total) by  mouth at bedtime.   ondansetron 8 MG disintegrating tablet Commonly known as:  ZOFRAN-ODT Take 1 tablet (8 mg total) by mouth every 8 (eight) hours as needed for nausea or vomiting.   oxyCODONE 5 MG immediate release tablet Commonly known as:  Oxy IR/ROXICODONE Take 1 tablet (5 mg total) by mouth every 4 (four) hours as needed for moderate pain or severe pain.   potassium phosphate (monobasic) 500 MG tablet Commonly known as:  K-PHOS ORIGINAL Take 1 tablet (500 mg total) by mouth 4 (four) times daily -  with meals and at bedtime.   predniSONE 10 MG tablet Commonly known as:  DELTASONE Take 40 mg oral daily for 2 weeks ( until 09/12/17) Then take 30 mg oral daily for next 2 weeks ( 09/13/17- 09/26/17) Then  take 20 mg oral daily for next 2 weeks  09/27/17- 10/11/17) Then take 10 mg oral daily for next 2 weeks.   PROAIR HFA 108 (90 Base) MCG/ACT inhaler Generic drug:  albuterol INL 2 PFS PO Q 4 TO 6 H PRN   promethazine 25 MG suppository Commonly known as:  PHENERGAN Place 1 suppository (25 mg total) rectally every 6 (six) hours as needed for nausea or vomiting.   senna-docusate 8.6-50 MG tablet Commonly known as:  Senokot-S Take 1 tablet by mouth at bedtime as needed for mild constipation.   STIOLTO RESPIMAT 2.5-2.5 MCG/ACT Aers Generic drug:  Tiotropium Bromide-Olodaterol INL 2 PFS PO QD   thiamine 100 MG tablet Take 1 tablet (100 mg total) by mouth daily. Start taking on:  08/31/2017   traMADol 50 MG tablet Commonly known as:  ULTRAM Take 1 tablet (50 mg total) by mouth every 6 (six) hours as needed.   Vitamin D (Ergocalciferol) 50000 units Caps capsule Commonly known as:  DRISDOL TK ONE C PO Q WEEK       Vitals:   08/30/17 0843 08/30/17 1152  BP: (!) 139/100 110/87  Pulse: 81 83  Resp: 18 18  Temp:    SpO2: 100% 99%    Skin clean, dry and intact without evidence of skin break down, no evidence of skin tears noted. Right upper chest port deaccessed.Site without signs and symptoms of complications. Pt denies pain at this time. No complaints noted.  An After Visit Summary was printed and given to the patient. Patient escorted via [place], and D/C home via private auto.  Rolley Sims

## 2017-08-30 NOTE — Progress Notes (Signed)
Nutrition Education Note   RD received consult for education regarding ways to maximize nutrition in setting of severe malnutrition and recent wt loss  RD provided "High-Calorie High-Protein Nutrition Therapy" handout from the [employer]. Reviewed patient's dietary recall. Provided examples on ways to increase caloric density of foods and beverages frequently consumed by the patient. Also provided ideas to promote variety and to incorporate additional nutrient dense foods into patient's diet. Discussed eating small frequent meals and snacks to assist in increasing overall po intake. Teach back method used.  Pt was also provided with supplement coupons and contact information for the Dietitian at the [facility]. Pt advised to schedule appointment with this Dietitian and to monitor his weights.    Expect good compliance.  Body mass index is 14.55 kg/m. Pt meets criteria for underweight based on current BMI.  RD is following this pt  Koleen Distance MS, RD, LDN Pager #[phone] Office#- [phone] After Hours Pager: [phone]

## 2017-09-02 ENCOUNTER — Ambulatory Visit
Admission: RE | Admit: 2017-09-02 | Discharge: 2017-09-02 | Disposition: A | Discharge status: Home / Self Care | Payer: Medicaid Other | Source: Ambulatory Visit | Attending: Hematology and Oncology | Admitting: Hematology and Oncology

## 2017-09-02 ENCOUNTER — Inpatient Hospital Stay: Payer: Medicaid Other

## 2017-09-02 ENCOUNTER — Inpatient Hospital Stay: Payer: Medicaid Other | Attending: Hematology and Oncology

## 2017-09-02 ENCOUNTER — Inpatient Hospital Stay (HOSPITAL_BASED_OUTPATIENT_CLINIC_OR_DEPARTMENT_OTHER): Payer: Medicaid Other | Admitting: Hematology and Oncology

## 2017-09-02 ENCOUNTER — Other Ambulatory Visit: Payer: Self-pay | Admitting: Urgent Care

## 2017-09-02 ENCOUNTER — Ambulatory Visit
Admission: RE | Admit: 2017-09-02 | Discharge: 2017-09-02 | Disposition: A | Discharge status: Home / Self Care | Payer: Medicaid Other | Source: Ambulatory Visit | Attending: Urgent Care | Admitting: Urgent Care

## 2017-09-02 ENCOUNTER — Encounter: Payer: Self-pay | Admitting: Hematology and Oncology

## 2017-09-02 ENCOUNTER — Other Ambulatory Visit: Payer: Self-pay | Admitting: Hematology and Oncology

## 2017-09-02 VITALS — BP 111/77 | HR 81 | Temp 97.9°F | Resp 18 | Ht 71.0 in | Wt 118.7 lb

## 2017-09-02 DIAGNOSIS — M25552 Pain in left hip: Secondary | ICD-10-CM | POA: Diagnosis not present

## 2017-09-02 DIAGNOSIS — C3490 Malignant neoplasm of unspecified part of unspecified bronchus or lung: Secondary | ICD-10-CM

## 2017-09-02 DIAGNOSIS — Z9181 History of falling: Secondary | ICD-10-CM

## 2017-09-02 DIAGNOSIS — Z86718 Personal history of other venous thrombosis and embolism: Secondary | ICD-10-CM | POA: Insufficient documentation

## 2017-09-02 DIAGNOSIS — F17210 Nicotine dependence, cigarettes, uncomplicated: Secondary | ICD-10-CM

## 2017-09-02 DIAGNOSIS — E2740 Unspecified adrenocortical insufficiency: Secondary | ICD-10-CM | POA: Diagnosis not present

## 2017-09-02 DIAGNOSIS — G894 Chronic pain syndrome: Secondary | ICD-10-CM

## 2017-09-02 DIAGNOSIS — D6481 Anemia due to antineoplastic chemotherapy: Secondary | ICD-10-CM

## 2017-09-02 DIAGNOSIS — C771 Secondary and unspecified malignant neoplasm of intrathoracic lymph nodes: Secondary | ICD-10-CM

## 2017-09-02 DIAGNOSIS — Z79899 Other long term (current) drug therapy: Secondary | ICD-10-CM

## 2017-09-02 DIAGNOSIS — Z9221 Personal history of antineoplastic chemotherapy: Secondary | ICD-10-CM | POA: Diagnosis not present

## 2017-09-02 DIAGNOSIS — Z923 Personal history of irradiation: Secondary | ICD-10-CM | POA: Insufficient documentation

## 2017-09-02 DIAGNOSIS — R5383 Other fatigue: Secondary | ICD-10-CM | POA: Insufficient documentation

## 2017-09-02 DIAGNOSIS — R197 Diarrhea, unspecified: Secondary | ICD-10-CM | POA: Insufficient documentation

## 2017-09-02 DIAGNOSIS — R350 Frequency of micturition: Secondary | ICD-10-CM | POA: Insufficient documentation

## 2017-09-02 DIAGNOSIS — S8001XA Contusion of right knee, initial encounter: Secondary | ICD-10-CM | POA: Diagnosis not present

## 2017-09-02 DIAGNOSIS — E46 Unspecified protein-calorie malnutrition: Secondary | ICD-10-CM | POA: Diagnosis not present

## 2017-09-02 DIAGNOSIS — E871 Hypo-osmolality and hyponatremia: Secondary | ICD-10-CM

## 2017-09-02 DIAGNOSIS — W01198D Fall on same level from slipping, tripping and stumbling with subsequent striking against other object, subsequent encounter: Secondary | ICD-10-CM | POA: Insufficient documentation

## 2017-09-02 DIAGNOSIS — E785 Hyperlipidemia, unspecified: Secondary | ICD-10-CM

## 2017-09-02 DIAGNOSIS — R634 Abnormal weight loss: Secondary | ICD-10-CM

## 2017-09-02 DIAGNOSIS — Z7952 Long term (current) use of systemic steroids: Secondary | ICD-10-CM | POA: Insufficient documentation

## 2017-09-02 DIAGNOSIS — G8929 Other chronic pain: Secondary | ICD-10-CM

## 2017-09-02 DIAGNOSIS — R110 Nausea: Secondary | ICD-10-CM | POA: Insufficient documentation

## 2017-09-02 DIAGNOSIS — M549 Dorsalgia, unspecified: Secondary | ICD-10-CM | POA: Insufficient documentation

## 2017-09-02 DIAGNOSIS — C7951 Secondary malignant neoplasm of bone: Secondary | ICD-10-CM

## 2017-09-02 DIAGNOSIS — I739 Peripheral vascular disease, unspecified: Secondary | ICD-10-CM | POA: Diagnosis not present

## 2017-09-02 DIAGNOSIS — I2510 Atherosclerotic heart disease of native coronary artery without angina pectoris: Secondary | ICD-10-CM | POA: Insufficient documentation

## 2017-09-02 DIAGNOSIS — R600 Localized edema: Secondary | ICD-10-CM | POA: Insufficient documentation

## 2017-09-02 DIAGNOSIS — R531 Weakness: Secondary | ICD-10-CM | POA: Insufficient documentation

## 2017-09-02 DIAGNOSIS — S40011A Contusion of right shoulder, initial encounter: Secondary | ICD-10-CM | POA: Diagnosis not present

## 2017-09-02 DIAGNOSIS — M1990 Unspecified osteoarthritis, unspecified site: Secondary | ICD-10-CM | POA: Insufficient documentation

## 2017-09-02 DIAGNOSIS — R42 Dizziness and giddiness: Secondary | ICD-10-CM

## 2017-09-02 DIAGNOSIS — T451X5S Adverse effect of antineoplastic and immunosuppressive drugs, sequela: Secondary | ICD-10-CM

## 2017-09-02 DIAGNOSIS — S0081XA Abrasion of other part of head, initial encounter: Secondary | ICD-10-CM | POA: Insufficient documentation

## 2017-09-02 DIAGNOSIS — R5381 Other malaise: Secondary | ICD-10-CM | POA: Insufficient documentation

## 2017-09-02 DIAGNOSIS — Z800 Family history of malignant neoplasm of digestive organs: Secondary | ICD-10-CM | POA: Insufficient documentation

## 2017-09-02 DIAGNOSIS — E43 Unspecified severe protein-calorie malnutrition: Secondary | ICD-10-CM

## 2017-09-02 DIAGNOSIS — I119 Hypertensive heart disease without heart failure: Secondary | ICD-10-CM | POA: Diagnosis not present

## 2017-09-02 DIAGNOSIS — R51 Headache: Secondary | ICD-10-CM

## 2017-09-02 DIAGNOSIS — Z803 Family history of malignant neoplasm of breast: Secondary | ICD-10-CM | POA: Insufficient documentation

## 2017-09-02 DIAGNOSIS — Z7189 Other specified counseling: Secondary | ICD-10-CM

## 2017-09-02 DIAGNOSIS — M545 Low back pain: Secondary | ICD-10-CM | POA: Diagnosis not present

## 2017-09-02 DIAGNOSIS — Z801 Family history of malignant neoplasm of trachea, bronchus and lung: Secondary | ICD-10-CM | POA: Insufficient documentation

## 2017-09-02 DIAGNOSIS — Z951 Presence of aortocoronary bypass graft: Secondary | ICD-10-CM | POA: Insufficient documentation

## 2017-09-02 DIAGNOSIS — R112 Nausea with vomiting, unspecified: Secondary | ICD-10-CM

## 2017-09-02 LAB — CBC WITH DIFFERENTIAL/PLATELET
Basophils Absolute: 0 10*3/uL (ref 0–0.1)
Basophils Relative: 1 %
Eosinophils Absolute: 0.1 10*3/uL (ref 0–0.7)
Eosinophils Relative: 2 %
HCT: 30.4 % — ABNORMAL LOW (ref 39.0–52.0)
Hemoglobin: 10.5 g/dL — ABNORMAL LOW (ref 13.0–17.0)
Lymphocytes Relative: 26 %
Lymphs Abs: 1 10*3/uL (ref 1.0–3.6)
MCH: 38.4 pg — ABNORMAL HIGH (ref 26.0–34.0)
MCHC: 34.5 g/dL (ref 30.0–36.0)
MCV: 103.6 fL — ABNORMAL HIGH (ref 78.0–100.0)
Monocytes Absolute: 0.4 10*3/uL (ref 0.2–1.0)
Monocytes Relative: 10 %
Neutro Abs: 2.3 10*3/uL (ref 1.4–6.5)
Neutrophils Relative %: 61 %
Platelets: 214 10*3/uL (ref 150–440)
RBC: 2.75 MIL/uL — ABNORMAL LOW (ref 4.22–5.81)
RDW: 14.9 % (ref 11.5–15.5)
WBC: 3.8 10*3/uL (ref 3.8–10.6)

## 2017-09-02 LAB — BASIC METABOLIC PANEL
Anion gap: 8 (ref 5–15)
BUN: 8 mg/dL (ref 6–20)
CO2: 24 mmol/L (ref 22–32)
Calcium: 8.3 mg/dL — ABNORMAL LOW (ref 8.9–10.3)
Chloride: 102 mmol/L (ref 101–111)
Creatinine, Ser: 0.8 mg/dL (ref 0.61–1.24)
GFR calc Af Amer: >60 mL/min (ref >60)
GFR calc non Af Amer: >60 mL/min (ref >60)
Glucose, Bld: 81 mg/dL (ref 65–99)
Potassium: 3.9 mmol/L (ref 3.5–5.1)
Sodium: 134 mmol/L — ABNORMAL LOW (ref 135–145)

## 2017-09-02 LAB — MAGNESIUM: Magnesium: 1.5 mg/dL — ABNORMAL LOW (ref 1.7–2.4)

## 2017-09-02 LAB — PHOSPHORUS: Phosphorus: 3.7 mg/dL (ref 2.5–4.6)

## 2017-09-02 MED ORDER — SODIUM CHLORIDE 0.9 % IV SOLN
Freq: Once | INTRAVENOUS | Status: AC
  Administered 2017-09-02: 12:00:00 via INTRAVENOUS
  Filled 2017-09-02: qty 1000

## 2017-09-02 MED ORDER — SODIUM CHLORIDE 0.9 % IV SOLN
1.0000 g | Freq: Once | INTRAVENOUS | Status: AC
  Administered 2017-09-02: 1 g via INTRAVENOUS
  Filled 2017-09-02: qty 2

## 2017-09-02 MED ORDER — CLOTRIMAZOLE 10 MG MT LOZG: 10.0000 mg | LOZENGE | Freq: Four times a day (QID) | OROMUCOSAL | 0 refills | Status: DC

## 2017-09-02 MED ORDER — HEPARIN SOD (PORK) LOCK FLUSH 100 UNIT/ML IV SOLN
500.0000 [IU] | Freq: Once | INTRAVENOUS | Status: AC
  Administered 2017-09-02: 500 [IU] via INTRAVENOUS

## 2017-09-02 NOTE — Progress Notes (Signed)
Increase pain noted to left hip ( pain level - 7-8 at this time) and bilateral feet swellen/ Only eating a little at a time

## 2017-09-02 NOTE — Progress Notes (Signed)
[facility] day:  09/02/2017   Chief Complaint: Philip [name] is a 57 y.o. male with extensive stage small cell lung cancer who is seen for assessment after interval hospitalization.  HPI:  The patient was last seen in the medical oncology clinic on 08/02/2017 by Honor Loh, NP.  At that time, he continue d to be "weak and tired". He had ongoing nausea and vomiting associated with eating.  Weight was up 4 pounds, however he was noted to be down 26 pounds overall when trended over the last 6 months.  Food boluses felt "stuck" in his distal esophagus, which causes him to vomit.  He had a negative UGI.  He had been referred to, and seen by, GI. Plans  were for EGD on 08/08/2017.   He continued to experience chronic pain in his legs and back. Exam  was stable.  Renal function was stable (CrCl 71 mL/min). Magnesium  was low at 1.2. Sodium was low at 124.  Potassium  was low at 2.8.  Calcium  was low at 8.6 (corrects to 9.6).  He received IV potassium and magnesium.  Low dose mirtazapine was tried.  EGD on 08/08/2017 revealed gastritis.  He was admitted to [facility] from 08/26/2017 - 08/30/2017 with nausea, vomiting, dehydration and electrolyte abnormalities.  He was seen by GI.  Head MRI without contrast on 08/29/2017 revealed no evidence of metastatic disease. Labs revealed no evidence of pancreatitis.  He was started on scheduled antiemetics and PPI BID.  AM cortisol was low.  He was started on prednisone 40 mg a day.  He was able to advance his diet.  Tube feeds were avoided.  During the interim, patient is doing "better" overall since he was discharged from the hospital. Patient is eating "some better". His nausea has improved. Weight is stable since his last clinic visit. Tube feedings were discussed during admission to the hospital.   Patient is complaining of pedal edema. He notes that it took him an hour to get his shoes on today. Shoes removed and  replaced in clinic without difficulties. Patient is having pain in his LEFT hip. Patient states, "it is almost like it locks in place. I can not squat down because I will never get back up". Pain rated 8/10 in the clinic today.   He denies increased shortness of breath. He has no B symptoms or recent infections.    Past Medical History:  Diagnosis Date  . Anemia   . CAD (coronary artery disease)    a. 08/2014 Inf STEMI/CABG x 3 (LIMA->LAD, VG->Diag, RIMA->RCA).  . DVT, recurrent, lower extremity, acute, left ([place]) 2016  . GIB (gastrointestinal bleeding)    a. In the setting of DAPT-->tolerating plavix only.  . Hyperlipidemia   . Hypertensive heart disease   . Lung cancer ([place])    a. s/p chemo/radiation.  Marland Kitchen PAD (peripheral artery disease) ([place])    a. 03/2015 Periph Angio: short occlusion of L pop w/ evidence of embolization into the DP->5.0x50 mm Inova self-expanding stent;  b. 04/08/2015 ABI: R 1.12, L 0.99; c. 01/2017 LE Duplex: bilat SFA obstructive dzs, patent L pop.  . Tobacco abuse   . Toe amputation status, left [facility]) 07/09/2016   2017    Past Surgical History:  Procedure Laterality Date  . CARDIAC CATHETERIZATION N/A 08/24/2014   Procedure: Left Heart Cath and Coronary Angiography;  Surgeon: Isaias Cowman, MD;  Location: [place] CV LAB;  Service: Cardiovascular;  Laterality: N/A;  . CARDIAC CATHETERIZATION N/A 08/24/2014   Procedure: Coronary Stent Intervention;  Surgeon: Isaias Cowman, MD;  Location: [place] CV [facility];  Service: Cardiovascular;  Laterality: N/A;  . CORONARY ARTERY BYPASS GRAFT N/A 08/25/2014   Procedure: CORONARY ARTERY BYPASS GRAFTING (CABG), ON PUMP, TIMES THREE, USING BILATERAL MAMMARY ARTERIES, RIGHT GREATER SAPHENOUS VEIN HARVESTED ENDOSCOPICALLY;  Surgeon: Melrose Nakayama, MD;  Location: [place];  Service: Open Heart Surgery;  Laterality: N/A;  . ESOPHAGOGASTRODUODENOSCOPY (EGD) WITH PROPOFOL N/A 08/08/2017   Procedure:  ESOPHAGOGASTRODUODENOSCOPY (EGD) WITH PROPOFOL;  Surgeon: Jonathon Bellows, MD;  Location[facility] ENDOSCOPY;  Service: Gastroenterology;  Laterality: N/A;  . INCISION AND DRAINAGE ABSCESS Left 11/15/2016   Procedure: INCISION AND DRAINAGE ABSCESS OF LEFT WRIST;  Surgeon: Roseanne Kaufman, MD;  Location: [place];  Service: Orthopedics;  Laterality: Left;  . PERIPHERAL VASCULAR CATHETERIZATION N/A 03/30/2015   Procedure: Abdominal Aortogram w/Lower Extremity;  Surgeon: Wellington Hampshire, MD;  Location: [place] [facility];  Service: Cardiovascular;  Laterality: N/A;  . PORTA CATH INSERTION N/A 07/27/2016   Procedure: Glori Luis Cath Insertion;  Surgeon: Algernon Huxley, MD;  Location: [place] CV LAB;  Service: Cardiovascular;  Laterality: N/A;  . TEE WITHOUT CARDIOVERSION N/A 08/25/2014   Procedure: TRANSESOPHAGEAL ECHOCARDIOGRAM (TEE);  Surgeon: Melrose Nakayama, MD;  Location: [place];  Service: Open Heart Surgery;  Laterality: N/A;    Family History  Problem Relation Age of Onset  . Hypertension Father   . Lung cancer Father   . Heart attack Mother 76       STENT  . Hyperlipidemia Mother   . Heart attack Brother   . Heart attack Brother   . Breast cancer Sister   . Heart attack Sister   . Colon cancer Paternal Grandfather     Social History:  reports that he has been smoking cigarettes.  He has been smoking about 0.50 packs per day. He has never used smokeless tobacco. He reports that he drinks alcohol. He reports that he does not use drugs.  The patient smokes 1/2 pack of cigarettes/day (previously 2 1/2 ppd).  He started smoking in his teens.  He denies any exposure to radiation or toxins.  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  He lives in [place].  His wife is named Publishing copy.  He is accompanied by his wife today.  Allergies:  Allergies  Allergen Reactions  . Tylenol [Acetaminophen] Palpitations  . Amitriptyline Other (See Comments)    Burning sensation all over  . Aspirin   .  Contrast Media [Iodinated Diagnostic Agents] Other (See Comments)    Pt states that he got "knots behind his ears"  . Nsaids Other (See Comments)    Blood in stools  . Ibuprofen Other (See Comments) and Nausea Only    Blood in stools  . Tape Rash and Other (See Comments)    PLEASE USE COBAN WRAP; THE PATIENT'S SKIN TEARS WHEN BANDAGES ARE REMOVED!!    Current Medications: Current Outpatient Medications  Medication Sig Dispense Refill  . atorvastatin (LIPITOR) 40 MG tablet TAKE 1 TABLET BY MOUTH DAILY 90 tablet 3  . bisacodyl (DULCOLAX) 5 MG EC tablet Take 1 tablet (5 mg total) by mouth daily as needed for moderate constipation. 30 tablet 0  . clopidogrel (PLAVIX) 75 MG tablet Take 1 tablet (75 mg total) by mouth daily. 90 tablet 3  . famotidine (PEPCID) 20 MG tablet Take 1 tablet (20 mg total) by  mouth 2 (two) times daily. 60 tablet 0  . feeding supplement, ENSURE ENLIVE, (ENSURE ENLIVE) LIQD Take 237 mLs by mouth 3 (three) times daily between meals. 237 mL 12  . FLOVENT HFA 220 MCG/ACT inhaler INL 2 PFS PO BID  5  . folic acid (FOLVITE) 1 MG tablet Take 1 tablet (1 mg total) by mouth daily. 30 tablet 0  . magnesium oxide (MAG-OX) 400 (241.3 Mg) MG tablet Take 1 tablet (400 mg total) by mouth 3 (three) times daily. 90 tablet 3  . metoCLOPramide (REGLAN) 5 MG tablet Take 1 tablet (5 mg total) by mouth 4 (four) times daily -  before meals and at bedtime. 100 tablet 0  . metoprolol tartrate (LOPRESSOR) 25 MG tablet TAKE 1 TABLET BY MOUTH TWICE DAILY 180 tablet 3  . Multiple Vitamin (MULTIVITAMIN WITH MINERALS) TABS tablet Take 1 tablet by mouth daily. 30 tablet 1  . OLANZapine zydis (ZYPREXA) 10 MG disintegrating tablet Take 1 tablet (10 mg total) by mouth at bedtime. 30 tablet 0  . ondansetron (ZOFRAN-ODT) 8 MG disintegrating tablet Take 1 tablet (8 mg total) by mouth every 8 (eight) hours as needed for nausea or vomiting. 30 tablet 1  . oxyCODONE (OXY IR/ROXICODONE) 5 MG immediate release  tablet Take 1 tablet (5 mg total) by mouth every 4 (four) hours as needed for moderate pain or severe pain. 30 tablet 0  . potassium phosphate, monobasic, (K-PHOS ORIGINAL) 500 MG tablet Take 1 tablet (500 mg total) by mouth 4 (four) times daily -  with meals and at bedtime. 10 tablet 0  . predniSONE (DELTASONE) 10 MG tablet Take 40 mg oral daily for 2 weeks ( until 09/12/17) Then take 30 mg oral daily for next 2 weeks ( 09/13/17- 09/26/17) Then take 20 mg oral daily for next 2 weeks  09/27/17- 10/11/17) Then take 10 mg oral daily for next 2 weeks. 100 tablet 1  . PROAIR HFA 108 (90 Base) MCG/ACT inhaler INL 2 PFS PO Q 4 TO 6 H PRN  5  . promethazine (PHENERGAN) 25 MG suppository Place 1 suppository (25 mg total) rectally every 6 (six) hours as needed for nausea or vomiting. 12 each 5  . senna-docusate (SENOKOT-S) 8.6-50 MG tablet Take 1 tablet by mouth at bedtime as needed for mild constipation. 30 tablet 0  . STIOLTO RESPIMAT 2.5-2.5 MCG/ACT AERS INL 2 PFS PO QD  5  . thiamine 100 MG tablet Take 1 tablet (100 mg total) by mouth daily. 30 tablet 1  . traMADol (ULTRAM) 50 MG tablet Take 1 tablet (50 mg total) by mouth every 6 (six) hours as needed. 40 tablet 0  . vitamin C (VITAMIN C) 500 MG tablet Take 1 tablet (500 mg total) by mouth 2 (two) times daily. 60 tablet 1  . Vitamin D, Ergocalciferol, (DRISDOL) 50000 units CAPS capsule TK ONE C PO Q WEEK  5  . Calcium Carb-Cholecalciferol (CALCIUM + D3 PO) Take 1 tablet by mouth 3 (three) times daily.    . clotrimazole (MYCELEX) 10 MG troche Take 1 lozenge (10 mg total) by mouth 4 (four) times daily. 35 lozenge 0  . diphenhydrAMINE (BENADRYL) 25 mg capsule Take 25 mg by mouth 2 (two) times daily as needed for allergies.     Marland Kitchen lidocaine-prilocaine (EMLA) cream Apply to affected area once (Patient not taking: Reported on 09/02/2017) 30 g 3   No current facility-administered medications for this visit.    Facility-Administered Medications Ordered in Other  Visits  Medication Dose Route Frequency Provider Last Rate Last Dose  . heparin lock flush 100 unit/mL  500 Units Intravenous Once Lequita Asal, MD        Review of Systems  Constitutional: Positive for malaise/fatigue. Negative for diaphoresis, fever and weight loss (stable since last visit).  HENT: Negative for nosebleeds and sore throat.        Oral candidiasis related to steriods  Eyes: Negative.   Respiratory: Negative for cough, hemoptysis, sputum production and shortness of breath.   Cardiovascular: Positive for leg swelling (bilateral ankle edema). Negative for chest pain, palpitations, orthopnea and PND.  Gastrointestinal: Positive for nausea (improved). Negative for abdominal pain, blood in stool, constipation, diarrhea, melena and vomiting.  Genitourinary: Negative for dysuria, frequency, hematuria and urgency.  Musculoskeletal: Positive for back pain (chronic) and joint pain (LEFT hip pain; acute). Negative for falls and myalgias.       BLE pain related to known vascular insufficiency  Skin: Negative for itching and rash.  Neurological: Positive for weakness (generalized). Negative for dizziness, tremors and headaches.  Endo/Heme/Allergies: Does not bruise/bleed easily.  Psychiatric/Behavioral: Negative for depression, memory loss and suicidal ideas. The patient is not nervous/anxious and does not have insomnia.   All other systems reviewed and are negative.   Physical Exam: Blood pressure 111/77, pulse 81, temperature 97.9 F (36.6 C), temperature source Tympanic, resp. rate 18, height '5\' 11"'  (1.803 m), weight 118 lb 11.2 oz (53.8 kg), SpO2 100 %. GENERAL:  Thin gentleman sitting comfortably in the exam room in no acute distress. MENTAL STATUS:  Alert and oriented to person, place and time. HEAD:  Temporal wasting.  Normocephalic, atraumatic, face symmetric, no Cushingoid features. EYES:  Brwon eyes.  Pupils equal round and reactive to light and accomodation.  No  conjunctivitis or scleral icterus. ENT:  Thrush.  Tongue normal. Mucous membranes moist.  RESPIRATORY:  Clear to auscultation without rales, wheezes or rhonchi. CARDIOVASCULAR:  Regular rate and rhythm without murmur, rub or gallop. ABDOMEN:  Soft, non-tender, with active bowel sounds, and no hepatosplenomegaly.  No masses. BACK/HIP:  Pain on palpation left hip and iliac crest.   SKIN:  No rashes, ulcers or lesions. EXTREMITIES: Pedal edema.  No skin discoloration or tenderness.  No palpable cords.  No Homan's sign. LYMPH NODES: No palpable cervical, supraclavicular, axillary or inguinal adenopathy  NEUROLOGICAL: Unremarkable. PSYCH:  Appropriate.    Appointment on 09/02/2017  Component Date Value Ref Range Status  . Phosphorus 09/02/2017 3.7  2.5 - 4.6 mg/dL Final   Performed at [facility], [address] [place], [place] [address]  . Sodium 09/02/2017 134* 135 - 145 mmol/L Final  . Potassium 09/02/2017 3.9  3.5 - 5.1 mmol/L Final  . Chloride 09/02/2017 102  101 - 111 mmol/L Final  . CO2 09/02/2017 24  22 - 32 mmol/L Final  . Glucose, Bld 09/02/2017 81  65 - 99 mg/dL Final  . BUN 09/02/2017 8  6 - 20 mg/dL Final  . Creatinine, Ser 09/02/2017 0.80  0.61 - 1.24 mg/dL Final  . Calcium 09/02/2017 8.3* 8.9 - 10.3 mg/dL Final  . GFR calc non Af Amer 09/02/2017 >60  >60 mL/min Final  . GFR calc Af Amer 09/02/2017 >60  >60 mL/min Final   Comment: (NOTE) The eGFR has been calculated using the CKD EPI equation. This calculation has not been validated in all clinical situations. eGFR's persistently <60 mL/min signify possible Chronic Kidney Disease.   . Anion gap 09/02/2017 8  5 - 15 Final   Performed at [facility], [address] [place], [place] [address]  . WBC 09/02/2017 3.8  3.8 - 10.6 K/uL Final  . RBC 09/02/2017 2.75* 4.22 - 5.81 MIL/uL Final  . Hemoglobin 09/02/2017 10.5* 13.0 - 17.0 g/dL Final  . HCT 09/02/2017 30.4* 39.0 - 52.0 % Final   OBTAINED BY  MANUAL METHOD  . MCV 09/02/2017 103.6* 78.0 - 100.0 fL Final  . MCH 09/02/2017 38.4* 26.0 - 34.0 pg Final  . MCHC 09/02/2017 34.5  30.0 - 36.0 g/dL Final  . RDW 09/02/2017 14.9  11.5 - 15.5 % Final  . Platelets 09/02/2017 214  150 - 440 K/uL Final  . Neutrophils Relative % 09/02/2017 61  % Final  . Neutro Abs 09/02/2017 2.3  1.4 - 6.5 K/uL Final  . Lymphocytes Relative 09/02/2017 26  % Final  . Lymphs Abs 09/02/2017 1.0  1.0 - 3.6 K/uL Final  . Monocytes Relative 09/02/2017 10  % Final  . Monocytes Absolute 09/02/2017 0.4  0.2 - 1.0 K/uL Final  . Eosinophils Relative 09/02/2017 2  % Final  . Eosinophils Absolute 09/02/2017 0.1  0 - 0.7 K/uL Final  . Basophils Relative 09/02/2017 1  % Final  . Basophils Absolute 09/02/2017 0.0  0 - 0.1 K/uL Final  . RBC Morphology 09/02/2017 MORPHOLOGY UNREMARKABLE   Corrected   Performed at [facility], [address] [place], [place] [address]  . Magnesium 09/02/2017 1.5* 1.7 - 2.4 mg/dL Final   Performed at [facility], [address] [place], [place] [address]    Assessment:  Philip [name] is a 57 y.o. male with extensive stage small cell lung cancer.  He presented with a 2 year history of night sweats and a 36 pound weight loss in 6 months.  Right supraclavicular biopsy on 07/19/2016 revealed small cell carcinoma of the lung.  CEA was 1.7 on 07/31/2016.  Chest CT on 06/28/2016 revealed extensive adenopathy.  There were multiple lymph nodes descending thoracic aorta, largest measuring 2.1 x 1.7 cm.  There was adenopathy in the aortopulmonary window with the largest confluence of lymph nodes measuring 4 x 2.6 cm. There was hilar adenopathy on the left measuring 2 x 1.9 cm. There were multiple enlarged lymph nodes in the pretracheal region, largest 1.7 x 1.7 cm.  There were lymph nodes to the left of the carina measuring 2.8 x 1.9 cm. There was a subcarinal lymph node measuring 2.4 x 1.6 cm. There was a 4 x 3 mm nodular opacity in the  anterior segment of the left upper lobe.    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  There was nodal metastasis within the chest and supraclavicular/low jugular regions.  There was multifocal osseous metastasis.  There was possibly a pathologic fracture involving the posteromedial right tenth rib.  There was new left lower lobe opacity with mild hypermetabolism, suspicious for infection or postobstructive pneumonitis.  He completed a course of Levaquin for apost-obstructive pneumonia.  He experienced transient positional left facial burning.  Head MRI on 07/26/2016 revealed no evidence of brain metastasis. There was punctate DWI hyperintensity along a high right frontal gyrus, possible recent infarct.  There was a small occipital bone metastasis.  Head MRA on 07/27/2016 revealed normal large and medium sized vessels.  Bilateral carotid duplex on 07/27/2016 revealed <50% stenosis in the right and left internal carotis arteries.  Echo on 07/27/2016 revealed an EF of 45-50%  without cardiac source of emboli.  He received 4 cycles of carboplatin and etoposide (07/31/2016 - 10/02/2016) with OnPro Neulasta support.  Platelet nadir was 40,000 on day 16 of cycle #2 and 23,000 on day 15 of cycle #3.  He received 2 units of PRBC with cycle #3 and 1 unit with cycle #4.  He received Xgeva on 08/13/2016 (last 10/30/2016).  Chest CT on 09/11/2016 revealed a marked response to therapy.  There was near complete resolution of left suprahilar mass and thoracic adenopathy.  There was interval sclerosis indicative of healing site of osseous metastasis.  Thoracic spine MRI on 10/22/2016 reveals sclerotic enhancing lesion at T7 vertebral body on the left compatible with metastasis. There was a healing fracture the right medial 10th rib.  PET scan on 11/12/2016 revealed a new 10 mm hypermetabolic left axillary lymph node (SUV 3.6) due to a left hand abscess.  There  were no other sites of metabolically active disease.  He completed a course of thoracic spine radiation (3000 cGy in 10 fractions) on 01/11/2017.  He received PCI (3000 cGy in 15 fractions) from 01/28/2017 - 02/15/2017.  Chest, abdomen, and pelvic CT on 03/18/2017 revealed new areas of irregular ground-glass in the medial aspects of the right upper and right lower lobes as well as somewhat amorphous ground-glass in the lingula and left lower lobe. Time interval favored infectious or inflammatory lesions.  Osseous metastatic disease was stable.  There was resolved or resected left axillary lymph node.  Bone scan on 04/10/2017 revealed multiple foci of skeletal metastasis within the ribs, pelvis, and RIGHT clavicle.  Multiple areas of activity corresponding to sclerotic lesions on CT and non-hypermetabolic lesions on PET scan.  PET scan on 04/26/2017 revealed bandlike densities medially in the right upper lobe and medially in the right lower lobe were new from 11/12/2016 but were present on 03/18/2017, and had faint hypermetabolic activity. Radiation pneumonitis could have this appearance.  The morphology would be unusual for malignancy but surveillance was warranted.  There were scattered stable sclerotic bony lesions that were not hypermetabolic.  There was persistent accentuated activity in the vicinity of the anus may be physiologic but was technically nonspecific.  He has chemotherapy induced anemia.  Anemia work-up on 09/11/2016 revealed the following normal labs:  Ferritin (219), iron sat (28%), TIBC (227), B12 (3055), and folate (6.8).  He has a history of left lower extremity DVT a few months after his cardiac surgery in 08/2014.  He has peripheral vascular disease s/p stent placement in 03/2015.  He is on a Plavix.  He has not had a colonoscopy in 30 years.  He was ineligible for the AbbVie clinical trial (M16-298) secondary to his left hand abscess.  He underwent surgical debridement of a left  hand abscess on 11/15/2016.  Wound cultures grew MRSA.  He was treated with emperic IV Vancomycin then Bactrim.   He was admitted to [facility] from 08/26/2017 - 08/30/2017 with nausea, vomiting, dehydration and electrolyte abnormalities.  Head MRI without contrast on 08/29/2017 revealed no evidence of metastatic disease. Prior UGI revealed no evidence of obstruction or gastroparesis.  Labs revealed no evidence of pancreatitis.  He was started on scheduled antiemetics and PPI BID.  AM cortisol on 08/28/2017 was low.  He was diagnosed with adrenal insufficiency.  He was started on prednisone 40 mg a day.  He was able to advance his diet.  Tube feeds were avoided.  Symptomatically, his nausea has improved. He is eating "somewhat better".  Weight  is stable. Chronic pain in his lower back and legs. He has acute pain in his LEFT hip. WBC is 3800 ([place] 2300).  Potassium is 3.9.  Magnesium is 1.5.  Plan: 1. Labs today:  CBC with diff, BMP, Mg, Phos. 2. Referral to endocrinology re: adrenal insufficiency. 3. Discuss low magnesium. Magnesium 1.5 today. Will replace with 1 gram intravenous magnesium today.  4. Discuss LEFT hip pain. Pain could potentially be related to osseous lytic lesions given history of metastatic cancer. Will send for diagnostic plain films of the LEFT hip today.  5. Discuss thrush. Patient on oral steroids. Will send for Clotrimazole troches.   6. Discuss nausea. Improved with prescribed antiemetics. Continue interventions as previously prescribed.  7. Discuss pain management. Patient has not heard from pain management clinic. Will follow up with faxed referral to [facility]. 8. Discuss nutrition. Patient is eating better. Weight is stable. Patient is no longer taking that prescribed mirtazapine. Will meet with RD today in infusion.  9. RTC in 1 week for MD assessment, labs (BMP, Mg, CEA), and +/- IV Mg.   Honor Loh, NP  09/02/2017, 11:33 AM   I saw and evaluated the patient,  participating in the key portions of the service and reviewing pertinent diagnostic studies and records.  I reviewed the nurse practitioner's note and agree with the findings and the plan.  The assessment and plan were discussed with the patient.  Multiple questions were asked by the patient and answered.   Nolon Stalls, MD 09/02/2017,11:33 AM

## 2017-09-02 NOTE — Progress Notes (Signed)
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 Recent hospital admission notes reviewed.  Met with patient and wife today in infusion.   Patient reports appetite is "some better".  Reports that he has still had nausea after being discharged from hospital and dry heaves but better.  Reports that yesterday he ate sausage link, 1/4 of hamburger steak and few bites of salad and drank 2 ensure plus with ice added and 2-3 tbsp of milk added.    Noted patient with smoker and Etoh use.  Medications: folic acid, reglan (stopped remeron after discharge per wife), MVI, potassium phosphate, predinsone, thiamine, Vit C  Reports that he has not picked up any of these medications following discharged except the reglan but will pick them up today.  Reports had to wait until daughter added money to his account.    Labs: phosphorus 3.7 WNL, Mag 1.5, K 3.9 today, MCV 103.6, MCH 38.4, folate 5.9 (08/27/17), Vit B 12 542  Anthropometrics:   Weight 118 lb 11.2 oz in clinic today.  Noted 104 lb in hospital, and 118 lb 4/19.     NUTRITION DIAGNOSIS: Malnutrition continues   MALNUTRITION DIAGNOSIS: severe malnutrition conitnues   INTERVENTION:   Discussed importance of continuing supplementation to replete micronutrients. Intake continues to be low based on 24 hr recall.  Patient reports that he will pick up medication today.  Patient requesting more ensure enlive today.  2nd case given to patient today.   MONITORING, EVALUATION, GOAL: weight trends intake   NEXT VISIT: phone follow-up May 30  Diquan Kassis B. Zenia Resides, [place], [place] Registered Dietitian [phone] (pager)

## 2017-09-03 ENCOUNTER — Telehealth: Payer: Self-pay | Admitting: *Deleted

## 2017-09-03 NOTE — Telephone Encounter (Signed)
Patient given results of negative for his hip x-ray.  Pain most likely due to arthritis, worse in left than right side.

## 2017-09-03 NOTE — Telephone Encounter (Signed)
This patient has been referred to Dr. Humphrey Rolls @ [facility], Pain Management  on 09-02-17.  Called to confirm receipt of referral - awaiting call back.  Also a referral was made to Dr. Gabriel Carina @ [facility], GI on 09-03-17.  Called to confirm receipt of referral.  Awaiting call back to confrim.

## 2017-09-04 ENCOUNTER — Telehealth: Payer: Self-pay | Admitting: *Deleted

## 2017-09-04 NOTE — Telephone Encounter (Signed)
Cora called from [facility] to let me know she got the referral for this patient.  She states it has been forwarded to the doctor for review which will take about a week.  If the doctor feels the patient is a good fit then their office will contact the patient for an appointment.

## 2017-09-05 ENCOUNTER — Other Ambulatory Visit: Payer: Self-pay | Admitting: *Deleted

## 2017-09-05 NOTE — Telephone Encounter (Signed)
  We talked about giving him oxycodone to get him thru the holiday weekend.  His use is small.  M

## 2017-09-05 NOTE — Telephone Encounter (Signed)
Patient called requesting refill of his Oxycodone 5 mg, he says he is going on vacation Friday morning and needs this now. He was given # 30 tabs on 08/29/17 when he was discharged form hospital. Please advise/ refill if indicated

## 2017-09-05 NOTE — Telephone Encounter (Addendum)
No refill at this time. It has only been 6 days. He advised he was only using "1 or 2 tabs" a day when he was here. His pain is chronic in nature (back and legs). The pain in his hips is related to arthritic changes. He has been referred to pain management for evaluation. I would encourage him to call Dr. Rae Roam office to check on the status of the referral.

## 2017-09-05 NOTE — Telephone Encounter (Signed)
He states he has not heard from pain management

## 2017-09-05 NOTE — Telephone Encounter (Signed)
HE will need to call them. Its Dr. Laurelyn Sickle office. They have the referral since he refused to see the one here at the hospital.

## 2017-09-06 ENCOUNTER — Ambulatory Visit (INDEPENDENT_AMBULATORY_CARE_PROVIDER_SITE_OTHER): Payer: Medicaid Other | Admitting: Cardiovascular Disease

## 2017-09-06 ENCOUNTER — Encounter: Payer: Self-pay | Admitting: Cardiovascular Disease

## 2017-09-06 VITALS — BP 114/64 | HR 86 | Ht 71.0 in | Wt 114.0 lb

## 2017-09-06 DIAGNOSIS — I739 Peripheral vascular disease, unspecified: Secondary | ICD-10-CM

## 2017-09-06 DIAGNOSIS — R52 Pain, unspecified: Secondary | ICD-10-CM

## 2017-09-06 DIAGNOSIS — I2510 Atherosclerotic heart disease of native coronary artery without angina pectoris: Secondary | ICD-10-CM

## 2017-09-06 DIAGNOSIS — E785 Hyperlipidemia, unspecified: Secondary | ICD-10-CM | POA: Diagnosis not present

## 2017-09-06 DIAGNOSIS — I10 Essential (primary) hypertension: Secondary | ICD-10-CM

## 2017-09-06 NOTE — Patient Instructions (Signed)
Medication Instructions: Your physician recommends that you continue on your current medications as directed. Please refer to the Current Medication list given to you today.  If you need a refill on your cardiac medications before your next appointment, please call your pharmacy.    Follow-Up: Your physician wants you to follow-up in 6 months with Dr. Fletcher Anon. You will receive a reminder letter in the mail two months in advance. If you don't receive a letter, please call our office at [phone] to schedule this follow-up appointment.   Special Instructions: You have been referred to the [facility] with Dr. Holley Raring. You should be receiving a call soon for an appointment.    Thank you for choosing [facility] at [facility]!

## 2017-09-06 NOTE — Progress Notes (Signed)
Cardiology Office Note   Date:  09/06/2017   ID:  Philip [name], DOB 1960-09-08, MRN [id number]  PCP:  Dr. Iona Beard  Cardiologist:   Kathlyn Sacramento, MD   Chief Complaint  Patient presents with  . Other    2 month follow up. Patient states he has lost about 4 pounds, swelling in feet, and SOB. Meds reviewed verbally with patient.       History of Present Illness: Philip [name] is a 57 y.o. male who presents for a follow-up visit regarding coronary artery disease and peripheral arterial disease.  The patient has known history of coronary artery disease. He presented with inferior STEMI 08/24/2014. Cardiac catheterization revealed a complex 90% stenosis proximal LAD, 95% stenosis mid LAD, 70% stenosis D1, and 75% stenosis mid RCA.  He underwent CABG at [facility] with LIMA to LAD, SVG 2D 1, and RIMA to RCA. He has other medical problems that include tobacco use, hypertension and hyperlipidemia. He has known history of peripheral arterial disease with previous left popliteal artery self-expanding stent placement due to rest pain and embolization into the dorsalis pedis. He had GI bleed on dual antiplatelet therapy and currently on clopidogrel without aspirin. He has metastatic small cell lung cancer treated with radiation and chemotherapy.   The patient lost 70 to 80 pounds over the last 2 years.  He still has a lot of nausea and vomiting.  He has no appetite.  He has severe bilateral foot pain and swelling which is thought to be due to peripheral neuropathy He underwent noninvasive vascular evaluation in October which showed normal ABI on the right side mildly reduced on the left side with patent left popliteal artery stent and significant stenosis in the mid left SFA.  He was hospitalized again recently at [facility] for nausea and vomiting.  Past Medical History:  Diagnosis Date  . Anemia   . CAD (coronary artery disease)    a. 08/2014 Inf STEMI/CABG x 3 (LIMA->LAD, VG->Diag, RIMA->RCA).    . DVT, recurrent, lower extremity, acute, left ([place]) 2016  . GIB (gastrointestinal bleeding)    a. In the setting of DAPT-->tolerating plavix only.  . Hyperlipidemia   . Hypertensive heart disease   . Lung cancer ([place])    a. s/p chemo/radiation.  Marland Kitchen PAD (peripheral artery disease) ([place])    a. 03/2015 Periph Angio: short occlusion of L pop w/ evidence of embolization into the DP->5.0x50 mm Inova self-expanding stent;  b. 04/08/2015 ABI: R 1.12, L 0.99; c. 01/2017 LE Duplex: bilat SFA obstructive dzs, patent L pop.  . Tobacco abuse   . Toe amputation status, left [facility]) 07/09/2016   2017    Past Surgical History:  Procedure Laterality Date  . CARDIAC CATHETERIZATION N/A 08/24/2014   Procedure: Left Heart Cath and Coronary Angiography;  Surgeon: Isaias Cowman, MD;  Location: [place] CV [facility];  Service: Cardiovascular;  Laterality: N/A;  . CARDIAC CATHETERIZATION N/A 08/24/2014   Procedure: Coronary Stent Intervention;  Surgeon: Isaias Cowman, MD;  Location: [place] [facility];  Service: Cardiovascular;  Laterality: N/A;  . CORONARY ARTERY BYPASS GRAFT N/A 08/25/2014   Procedure: CORONARY ARTERY BYPASS GRAFTING (CABG), ON PUMP, TIMES THREE, USING BILATERAL MAMMARY ARTERIES, RIGHT GREATER SAPHENOUS VEIN HARVESTED ENDOSCOPICALLY;  Surgeon: Melrose Nakayama, MD;  Location: [place];  Service: Open Heart Surgery;  Laterality: N/A;  . ESOPHAGOGASTRODUODENOSCOPY (EGD) WITH PROPOFOL N/A 08/08/2017   Procedure: ESOPHAGOGASTRODUODENOSCOPY (EGD) WITH PROPOFOL;  Surgeon: Jonathon Bellows, MD;  Location: [facility];  Service: Gastroenterology;  Laterality: N/A;  . INCISION AND DRAINAGE ABSCESS Left 11/15/2016   Procedure: INCISION AND DRAINAGE ABSCESS OF LEFT WRIST;  Surgeon: Roseanne Kaufman, MD;  Location: [place];  Service: Orthopedics;  Laterality: Left;  . PERIPHERAL VASCULAR CATHETERIZATION N/A 03/30/2015   Procedure: Abdominal Aortogram w/Lower Extremity;  Surgeon: Wellington Hampshire, MD;   Location: [place] [facility];  Service: Cardiovascular;  Laterality: N/A;  . PORTA CATH INSERTION N/A 07/27/2016   Procedure: Glori Luis Cath Insertion;  Surgeon: Algernon Huxley, MD;  Location: [place] [facility];  Service: Cardiovascular;  Laterality: N/A;  . TEE WITHOUT CARDIOVERSION N/A 08/25/2014   Procedure: TRANSESOPHAGEAL ECHOCARDIOGRAM (TEE);  Surgeon: Melrose Nakayama, MD;  Location: [place];  Service: Open Heart Surgery;  Laterality: N/A;     Current Outpatient Medications  Medication Sig Dispense Refill  . atorvastatin (LIPITOR) 40 MG tablet TAKE 1 TABLET BY MOUTH DAILY 90 tablet 3  . bisacodyl (DULCOLAX) 5 MG EC tablet Take 1 tablet (5 mg total) by mouth daily as needed for moderate constipation. 30 tablet 0  . Calcium Carb-Cholecalciferol (CALCIUM + D3 PO) Take 1 tablet by mouth 3 (three) times daily.    . clopidogrel (PLAVIX) 75 MG tablet Take 1 tablet (75 mg total) by mouth daily. 90 tablet 3  . clotrimazole (MYCELEX) 10 MG troche Take 1 lozenge (10 mg total) by mouth 4 (four) times daily. 35 lozenge 0  . diphenhydrAMINE (BENADRYL) 25 mg capsule Take 25 mg by mouth 2 (two) times daily as needed for allergies.     . famotidine (PEPCID) 20 MG tablet Take 1 tablet (20 mg total) by mouth 2 (two) times daily. 60 tablet 0  . feeding supplement, ENSURE ENLIVE, (ENSURE ENLIVE) LIQD Take 237 mLs by mouth 3 (three) times daily between meals. 237 mL 12  . FLOVENT HFA 220 MCG/ACT inhaler INL 2 PFS PO BID  5  . folic acid (FOLVITE) 1 MG tablet Take 1 tablet (1 mg total) by mouth daily. 30 tablet 0  . lidocaine-prilocaine (EMLA) cream Apply to affected area once 30 g 3  . magnesium oxide (MAG-OX) 400 (241.3 Mg) MG tablet Take 1 tablet (400 mg total) by mouth 3 (three) times daily. 90 tablet 3  . metoCLOPramide (REGLAN) 5 MG tablet Take 1 tablet (5 mg total) by mouth 4 (four) times daily -  before meals and at bedtime. 100 tablet 0  . metoprolol tartrate (LOPRESSOR) 25 MG tablet TAKE 1 TABLET BY  MOUTH TWICE DAILY 180 tablet 3  . Multiple Vitamin (MULTIVITAMIN WITH MINERALS) TABS tablet Take 1 tablet by mouth daily. 30 tablet 1  . OLANZapine zydis (ZYPREXA) 10 MG disintegrating tablet Take 1 tablet (10 mg total) by mouth at bedtime. 30 tablet 0  . ondansetron (ZOFRAN-ODT) 8 MG disintegrating tablet Take 1 tablet (8 mg total) by mouth every 8 (eight) hours as needed for nausea or vomiting. 30 tablet 1  . potassium phosphate, monobasic, (K-PHOS ORIGINAL) 500 MG tablet Take 1 tablet (500 mg total) by mouth 4 (four) times daily -  with meals and at bedtime. 10 tablet 0  . predniSONE (DELTASONE) 10 MG tablet Take 40 mg oral daily for 2 weeks ( until 09/12/17) Then take 30 mg oral daily for next 2 weeks ( 09/13/17- 09/26/17) Then take 20 mg oral daily for next 2 weeks  09/27/17- 10/11/17) Then take 10 mg oral daily for next 2 weeks. 100 tablet 1  . PROAIR HFA 108 (90 Base) MCG/ACT  inhaler INL 2 PFS PO Q 4 TO 6 H PRN  5  . promethazine (PHENERGAN) 25 MG suppository Place 1 suppository (25 mg total) rectally every 6 (six) hours as needed for nausea or vomiting. 12 each 5  . senna-docusate (SENOKOT-S) 8.6-50 MG tablet Take 1 tablet by mouth at bedtime as needed for mild constipation. 30 tablet 0  . STIOLTO RESPIMAT 2.5-2.5 MCG/ACT AERS INL 2 PFS PO QD  5  . thiamine 100 MG tablet Take 1 tablet (100 mg total) by mouth daily. 30 tablet 1  . traMADol (ULTRAM) 50 MG tablet Take 1 tablet (50 mg total) by mouth every 6 (six) hours as needed. 40 tablet 0  . vitamin C (VITAMIN C) 500 MG tablet Take 1 tablet (500 mg total) by mouth 2 (two) times daily. 60 tablet 1  . Vitamin D, Ergocalciferol, (DRISDOL) 50000 units CAPS capsule TK ONE C PO Q WEEK  5   No current facility-administered medications for this visit.    Facility-Administered Medications Ordered in Other Visits  Medication Dose Route Frequency Provider Last Rate Last Dose  . heparin lock flush 100 unit/mL  500 Units Intravenous Once Lequita Asal, MD        Allergies:   Tylenol [acetaminophen]; Amitriptyline; Aspirin; Contrast media [iodinated diagnostic agents]; Nsaids; Ibuprofen; and Tape    Social History:  The patient  reports that he has been smoking cigarettes.  He has been smoking about 0.25 packs per day. He has never used smokeless tobacco. He reports that he drinks alcohol. He reports that he does not use drugs.   Family History:  The patient's family history includes Breast cancer in his sister; Colon cancer in his paternal grandfather; Heart attack in his brother, brother, and sister; Heart attack (age of onset: 52) in his mother; Hyperlipidemia in his mother; Hypertension in his father; Lung cancer in his father.    ROS:  Please see the history of present illness.   Otherwise, review of systems are positive for none.   All other systems are reviewed and negative.    PHYSICAL EXAM: VS:  BP 114/64 (BP Location: Right Arm, Patient Position: Sitting, Cuff Size: Normal)   Pulse 86   Ht 5\' 11"  (1.803 m)   Wt 114 lb (51.7 kg)   BMI 15.90 kg/m  , BMI Body mass index is 15.9 kg/m. GEN: Well nourished, well developed, in no acute distress  HEENT: normal  Neck: no JVD, carotid bruits, or masses Cardiac: RRR; no murmurs, rubs, or gallops,no edema  Respiratory:  clear to auscultation bilaterally, normal work of breathing GI: soft, nontender, nondistended, + BS MS: no deformity or atrophy  Skin: warm and dry, no rash Neuro:  Strength and sensation are intact Psych: euthymic mood, full affect Vascular:  femoral pulses mildly diminished bilaterally.   Pedal pulses are palpable on the right side but not the left side.  EKG:  EKG is ordered today. The ekg ordered today demonstrates normal sinus rhythm with no significant ST or T wave changes.   Recent Labs: 08/28/2017: TSH 0.473 08/30/2017: ALT 14 09/02/2017: BUN 8; Creatinine, Ser 0.80; Hemoglobin 10.5; Magnesium 1.5; Platelets 214; Potassium 3.9; Sodium 134    Lipid  Panel    Component Value Date/Time   CHOL 182 07/27/2016 0602   TRIG 115 07/27/2016 0602   HDL 47 07/27/2016 0602   CHOLHDL 3.9 07/27/2016 0602   VLDL 23 07/27/2016 0602   LDLCALC 112 (H) 07/27/2016 0602  Wt Readings from Last 3 Encounters:  09/06/17 114 lb (51.7 kg)  09/02/17 118 lb 11.2 oz (53.8 kg)  08/27/17 104 lb 4.8 oz (47.3 kg)       No flowsheet data found.    ASSESSMENT AND PLAN:  1.  Severe peripheral neuropathy: Although the patient has peripheral arterial disease with claudication, the majority of his symptoms seem to be related to peripheral neuropathy.  No significant improvement with gabapentin.  He might have some other etiology.  2. Peripheral arterial disease: Status post left popliteal artery self-expanding stent placement in December 2016.    Current symptoms of severe foot pain do not seem to be due to peripheral arterial disease.  He does have disease affecting the left SFA but the patient does not have significant left calf claudication.  Continue medical therapy.  3.  Coronary artery disease involving native coronary arteries without angina: Continue medical therapy.   4. Hyperlipidemia: Continue treatment with atorvastatin.  5. Severe generalized pain: The patient was referred to the pain clinic but has not been able to get an appointment.  We will try to assist him with this.   Disposition:   FU with me in 6 months  Signed,  Kathlyn Sacramento, MD  09/06/2017 2:18 PM    [facility]

## 2017-09-07 NOTE — Progress Notes (Addendum)
[name] Clinic day:  09/10/2017   Chief Complaint: Philip [name] is a 57 y.o. male with extensive stage small cell lung cancer who is seen for a 1 week assessment.   HPI:  The patient was last seen in the medical oncology clinic on 09/02/2017.  At that time, patient was doing "better" overall since being discharged from the hospital.  Patient was eating better.  Nausea had improved.  Weight was stable.  He complained of pedal edema and pain in his LEFT hip.  Exam revealed edema extending up to the ankles bilaterally. He has been referred to pain management.  He was referred to Dr. Gabriel Carina for adrenal insufficiency. WBC was 3800 ([place] 2300).  Potassium was 3.9.  Magnesium was 1.5.  Patient received 1 g of intravenous magnesium replacement.  Diagnostic plain films were done of the patient's LEFT hip on 09/02/2017.  Radiographs revealed degenerative changes in the bilateral hip joints, with the left being more significant than the right. There was no mention of new osseous lytic lesions.  Patient was seen in consult by cancer center RD on 09/02/2017 to discuss protein calorie malnutrition.  Notes reviewed.  RD commented on goals of replacing micronutrients through improved intake and supplementation.  Of note, patient prescribed several supplements at the time of his discharge from the hospital, however he had not picked up the prescriptions.  RD provided patient with second case of Ensure enlive supplement shakes.  On 09/12/2017 he was scheduled to follow-up with RD.  Patient contacted the office on 09/05/2017 requesting refills of his narcotic pain medication citing that he was going on vacation and needed more. Refill denied. Of note, patient received oxycodone 5 mg #30 on 08/30/2017 prior to discharge from the hospital.  During his last visit here to the medical oncology clinic, patient noted that he was only using "1 or 2 pills per day". Patient's pain is  chronic in nature, and felt to be unrelated to his cancer diagnosis. He has advised in the past that the pain in his back and legs was present "years" before he was diagnosed with cancer. The "acute" pain is related to arthritis. He has Tramadol at home to use on a PRN basis.  Additionally, patient has been referred to pain management in the past, however he refuses to be seen in the [facility] pain management clinic due to past issues related to the care of his son.  In light of this fact, patient was referred to see Dr. Nathanial Rancher. He was encouraged to contact Dr. Laurelyn Sickle office to check on the status of his appointment.    Patient was seen and follow-up consult by Dr. Fletcher Anon (cardiology) on 09/06/2017.  Notes reviewed. Patient has severe peripheral arterial disease with claudication pain, however the majority of the symptoms were felt to be related to neuropathy.  No improvement noted on gabapentin.  Despite peripheral vascular disease, there are no further interventions planned at this time. Medical treatment approach to continue, and patient will follow up in 6 months. Cardiology also commented on patient's generalized pain.  Office staff also attempting to get patient scheduled with pain management.   In the interim, patient sustained a traumatic fall on Saturday. Patient has bruising to his RIGHT shoulder, RIGHT knee, and RIGHT temple area. He notes that he tripped over a root, which caused him to fall. Patient advises that he struck his face on a rock. Wife advising that area to temple has been  having exudative drainage. Patient states, "I think the rock hit the corner of my eye". Denies visual changes. Patient did not lose consciousness. Patient has been out of pain medications x 2 days. He notes that he was using 6 pills/day when first prescribed by the ED, however he tried to taper himself down to 2 pills. Pain rated 4/10 in clinic today.   Patient continues to have difficulties when eating. He is unable  to tolerate solid foods. Patient able to tolerate supplement shakes mixed with ice cream. Weight is down 5 pounds. Patient continues on oral steroid therapy for adrenal insufficiency. Patient notes that foods do not taste the same to him, which makes him not want to eat.   Patient complains of urinary frequency. He notes that this is an acute complaint. Patient states, "if I don't have something close to me, I will have a problem". Patient had diarrhea x 2 days ago. He denies recurrent diarrhea.    Past Medical History:  Diagnosis Date  . Anemia   . CAD (coronary artery disease)    a. 08/2014 Inf STEMI/CABG x 3 (LIMA->LAD, VG->Diag, RIMA->RCA).  . DVT, recurrent, lower extremity, acute, left ([place]) 2016  . GIB (gastrointestinal bleeding)    a. In the setting of DAPT-->tolerating plavix only.  . Hyperlipidemia   . Hypertensive heart disease   . Lung cancer ([place])    a. s/p chemo/radiation.  Marland Kitchen PAD (peripheral artery disease) ([place])    a. 03/2015 Periph Angio: short occlusion of L pop w/ evidence of embolization into the DP->5.0x50 mm Inova self-expanding stent;  b. 04/08/2015 ABI: R 1.12, L 0.99; c. 01/2017 LE Duplex: bilat SFA obstructive dzs, patent L pop.  . Tobacco abuse   . Toe amputation status, left [facility]) 07/09/2016   2017    Past Surgical History:  Procedure Laterality Date  . CARDIAC CATHETERIZATION N/A 08/24/2014   Procedure: Left Heart Cath and Coronary Angiography;  Surgeon: Isaias Cowman, MD;  Location: [place] CV LAB;  Service: Cardiovascular;  Laterality: N/A;  . CARDIAC CATHETERIZATION N/A 08/24/2014   Procedure: Coronary Stent Intervention;  Surgeon: Isaias Cowman, MD;  Location: [place] CV LAB;  Service: Cardiovascular;  Laterality: N/A;  . CORONARY ARTERY BYPASS GRAFT N/A 08/25/2014   Procedure: CORONARY ARTERY BYPASS GRAFTING (CABG), ON PUMP, TIMES THREE, USING BILATERAL MAMMARY ARTERIES, RIGHT GREATER SAPHENOUS VEIN HARVESTED ENDOSCOPICALLY;   Surgeon: Melrose Nakayama, MD;  Location: [place];  Service: Open Heart Surgery;  Laterality: N/A;  . ESOPHAGOGASTRODUODENOSCOPY (EGD) WITH PROPOFOL N/A 08/08/2017   Procedure: ESOPHAGOGASTRODUODENOSCOPY (EGD) WITH PROPOFOL;  Surgeon: Jonathon Bellows, MD;  Location: [facility] ENDOSCOPY;  Service: Gastroenterology;  Laterality: N/A;  . INCISION AND DRAINAGE ABSCESS Left 11/15/2016   Procedure: INCISION AND DRAINAGE ABSCESS OF LEFT WRIST;  Surgeon: Roseanne Kaufman, MD;  Location: [place];  Service: Orthopedics;  Laterality: Left;  . PERIPHERAL VASCULAR CATHETERIZATION N/A 03/30/2015   Procedure: Abdominal Aortogram w/Lower Extremity;  Surgeon: Wellington Hampshire, MD;  Location: [place] CV LAB;  Service: Cardiovascular;  Laterality: N/A;  . PORTA CATH INSERTION N/A 07/27/2016   Procedure: Glori Luis Cath Insertion;  Surgeon: Algernon Huxley, MD;  Location: [place] CV LAB;  Service: Cardiovascular;  Laterality: N/A;  . TEE WITHOUT CARDIOVERSION N/A 08/25/2014   Procedure: TRANSESOPHAGEAL ECHOCARDIOGRAM (TEE);  Surgeon: Melrose Nakayama, MD;  Location: [place];  Service: Open Heart Surgery;  Laterality: N/A;    Family History  Problem Relation Age of Onset  . Hypertension Father   .  Lung cancer Father   . Heart attack Mother 21       STENT  . Hyperlipidemia Mother   . Heart attack Brother   . Heart attack Brother   . Breast cancer Sister   . Heart attack Sister   . Colon cancer Paternal Grandfather     Social History:  reports that he has been smoking cigarettes.  He has been smoking about 0.25 packs per day. He has never used smokeless tobacco. He reports that he drinks alcohol. He reports that he does not use drugs.  The patient smokes 1/2 pack of cigarettes/day (previously 2 1/2 ppd).  He started smoking in his teens.  He denies any exposure to radiation or toxins.  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  He lives in [place].  His wife is named Publishing copy.  He is accompanied by his wife  today.  Allergies:  Allergies  Allergen Reactions  . Tylenol [Acetaminophen] Palpitations  . Amitriptyline Other (See Comments)    Burning sensation all over  . Aspirin   . Contrast Media [Iodinated Diagnostic Agents] Other (See Comments)    Pt states that he got "knots behind his ears"  . Nsaids Other (See Comments)    Blood in stools  . Ibuprofen Other (See Comments) and Nausea Only    Blood in stools  . Tape Rash and Other (See Comments)    PLEASE USE COBAN WRAP; THE PATIENT'S SKIN TEARS WHEN BANDAGES ARE REMOVED!!    Current Medications: Current Outpatient Medications  Medication Sig Dispense Refill  . atorvastatin (LIPITOR) 40 MG tablet TAKE 1 TABLET BY MOUTH DAILY 90 tablet 3  . bisacodyl (DULCOLAX) 5 MG EC tablet Take 1 tablet (5 mg total) by mouth daily as needed for moderate constipation. 30 tablet 0  . Calcium Carb-Cholecalciferol (CALCIUM + D3 PO) Take 1 tablet by mouth 3 (three) times daily.    . clopidogrel (PLAVIX) 75 MG tablet Take 1 tablet (75 mg total) by mouth daily. 90 tablet 3  . clotrimazole (MYCELEX) 10 MG troche Take 1 lozenge (10 mg total) by mouth 4 (four) times daily. 35 lozenge 0  . diphenhydrAMINE (BENADRYL) 25 mg capsule Take 25 mg by mouth 2 (two) times daily as needed for allergies.     . famotidine (PEPCID) 20 MG tablet Take 1 tablet (20 mg total) by mouth 2 (two) times daily. 60 tablet 0  . feeding supplement, ENSURE ENLIVE, (ENSURE ENLIVE) LIQD Take 237 mLs by mouth 3 (three) times daily between meals. 237 mL 12  . FLOVENT HFA 220 MCG/ACT inhaler INL 2 PFS PO BID  5  . folic acid (FOLVITE) 1 MG tablet Take 1 tablet (1 mg total) by mouth daily. 30 tablet 0  . lidocaine-prilocaine (EMLA) cream Apply to affected area once 30 g 3  . magnesium oxide (MAG-OX) 400 (241.3 Mg) MG tablet Take 1 tablet (400 mg total) by mouth 3 (three) times daily. 90 tablet 3  . metoCLOPramide (REGLAN) 5 MG tablet Take 1 tablet (5 mg total) by mouth 4 (four) times daily -   before meals and at bedtime. 100 tablet 0  . metoprolol tartrate (LOPRESSOR) 25 MG tablet TAKE 1 TABLET BY MOUTH TWICE DAILY 180 tablet 3  . Multiple Vitamin (MULTIVITAMIN WITH MINERALS) TABS tablet Take 1 tablet by mouth daily. 30 tablet 1  . OLANZapine zydis (ZYPREXA) 10 MG disintegrating tablet Take 1 tablet (10 mg total) by mouth at bedtime. 30 tablet 0  .  ondansetron (ZOFRAN-ODT) 8 MG disintegrating tablet Take 1 tablet (8 mg total) by mouth every 8 (eight) hours as needed for nausea or vomiting. 30 tablet 1  . potassium phosphate, monobasic, (K-PHOS ORIGINAL) 500 MG tablet Take 1 tablet (500 mg total) by mouth 4 (four) times daily -  with meals and at bedtime. 10 tablet 0  . predniSONE (DELTASONE) 10 MG tablet Take 40 mg oral daily for 2 weeks ( until 09/12/17) Then take 30 mg oral daily for next 2 weeks ( 09/13/17- 09/26/17) Then take 20 mg oral daily for next 2 weeks  09/27/17- 10/11/17) Then take 10 mg oral daily for next 2 weeks. 100 tablet 1  . PROAIR HFA 108 (90 Base) MCG/ACT inhaler INL 2 PFS PO Q 4 TO 6 H PRN  5  . promethazine (PHENERGAN) 25 MG suppository Place 1 suppository (25 mg total) rectally every 6 (six) hours as needed for nausea or vomiting. 12 each 5  . senna-docusate (SENOKOT-S) 8.6-50 MG tablet Take 1 tablet by mouth at bedtime as needed for mild constipation. 30 tablet 0  . STIOLTO RESPIMAT 2.5-2.5 MCG/ACT AERS INL 2 PFS PO QD  5  . thiamine 100 MG tablet Take 1 tablet (100 mg total) by mouth daily. 30 tablet 1  . vitamin C (VITAMIN C) 500 MG tablet Take 1 tablet (500 mg total) by mouth 2 (two) times daily. 60 tablet 1  . Vitamin D, Ergocalciferol, (DRISDOL) 50000 units CAPS capsule TK ONE C PO Q WEEK  5  . traMADol (ULTRAM) 50 MG tablet Take 1 tablet (50 mg total) by mouth every 6 (six) hours as needed. (Patient not taking: Reported on 09/10/2017) 40 tablet 0   No current facility-administered medications for this visit.    Facility-Administered Medications Ordered in Other  Visits  Medication Dose Route Frequency Provider Last Rate Last Dose  . heparin lock flush 100 unit/mL  500 Units Intravenous Once Garrett Bowring C, MD      . heparin lock flush 100 unit/mL  500 Units Intravenous Once Cinque Begley C, MD      . sodium chloride flush (NS) 0.9 % injection 10 mL  10 mL Intravenous PRN Lequita Asal, MD   10 mL at 09/10/17 0847    Review of Systems  Constitutional: Positive for malaise/fatigue and weight loss (down 5 pounds). Negative for diaphoresis and fever.  HENT: Negative.   Eyes: Negative.   Respiratory: Negative for cough, hemoptysis, sputum production and shortness of breath.   Cardiovascular: Positive for claudication (chronic; followed by cardiology for known PVD; no further planned interventions at this time). Negative for chest pain, palpitations, orthopnea, leg swelling (bilateral ankle edema) and PND.  Gastrointestinal: Positive for diarrhea, nausea and vomiting. Negative for abdominal pain, blood in stool, constipation and melena.  Genitourinary: Positive for frequency. Negative for dysuria, hematuria and urgency.  Musculoskeletal: Positive for back pain (chronic) and joint pain (LEFT hip; plain films show degenerative changes). Negative for falls and myalgias.       BLE pain related to known PVD  Skin: Negative for itching and rash.       Bruising and areas of compromised skin integrity to RIGHT shoulder and RIGHT knee.   Neurological: Positive for weakness (generalized) and headaches. Negative for dizziness and tremors.  Endo/Heme/Allergies: Does not bruise/bleed easily.  Psychiatric/Behavioral: Negative for depression, memory loss and suicidal ideas. The patient is not nervous/anxious and does not have insomnia.   All other systems reviewed and are negative.  Performance status (ECOG): 2 - Symptomatic, <50% confined to bed  Physical Exam: Blood pressure 137/86, pulse 91, temperature (!) 96.1 F (35.6 C), temperature source  Tympanic, resp. rate 18, weight 109 lb 14.4 oz (49.9 kg), SpO2 100 %. GENERAL:  Thin ill appearing gentleman sitting comfortably in the exam room in no acute distress. MENTAL STATUS:  Alert and oriented to person, place and time. HEAD:  Short gray hair.  Normocephalic, atraumatic, face symmetric, no Cushingoid features. EYES:  Brown eyes.  Pupils equal round and reactive to light and accomodation.  No conjunctivitis or scleral icterus. ENT:  Oropharynx clear without lesion.  Tongue normal. Mucous membranes moist.  RESPIRATORY:  Clear to auscultation without rales, wheezes or rhonchi. CARDIOVASCULAR:  Regular rate and rhythm without murmur, rub or gallop. ABDOMEN:  Soft, non-tender, with active bowel sounds, and no hepatosplenomegaly.  No masses. SKIN:  Right side of face s/p trauma with denuded area and exudative material on gauze.  Right shoulder and knee with overlying gauze (removed) with denuded skin.  No rashes, ulcers or lesions. EXTREMITIES: No edema, no skin discoloration or tenderness.  No palpable cords. LYMPH NODES: No palpable cervical, supraclavicular, axillary or inguinal adenopathy  NEUROLOGICAL: Unremarkable. PSYCH:  Appropriate.    Appointment on 09/10/2017  Component Date Value Ref Range Status  . Magnesium 09/10/2017 1.4* 1.7 - 2.4 mg/dL Final   Performed at [facility], [address] [place], [place] [address]  . Sodium 09/10/2017 129* 135 - 145 mmol/L Final  . Potassium 09/10/2017 3.6  3.5 - 5.1 mmol/L Final  . Chloride 09/10/2017 99* 101 - 111 mmol/L Final  . CO2 09/10/2017 22  22 - 32 mmol/L Final  . Glucose, Bld 09/10/2017 72  65 - 99 mg/dL Final  . BUN 09/10/2017 5* 6 - 20 mg/dL Final  . Creatinine, Ser 09/10/2017 0.66  0.61 - 1.24 mg/dL Final  . Calcium 09/10/2017 8.5* 8.9 - 10.3 mg/dL Final  . GFR calc non Af Amer 09/10/2017 >60  >60 mL/min Final  . GFR calc Af Amer 09/10/2017 >60  >60 mL/min Final   Comment: (NOTE) The eGFR has been calculated  using the CKD EPI equation. This calculation has not been validated in all clinical situations. eGFR's persistently <60 mL/min signify possible Chronic Kidney Disease.   Georgiann Hahn gap 09/10/2017 8  5 - 15 Final   Performed at [facility], [address] [place], [place] [address]    Assessment:  DEMONTRAE GILBERT is a 57 y.o. male with extensive stage small cell lung cancer.  He presented with a 2 year history of night sweats and a 36 pound weight loss in 6 months.  Right supraclavicular biopsy on 07/19/2016 revealed small cell carcinoma of the lung.  CEA was 1.7 on 07/31/2016.  Chest CT on 06/28/2016 revealed extensive adenopathy.  There were multiple lymph nodes descending thoracic aorta, largest measuring 2.1 x 1.7 cm.  There was adenopathy in the aortopulmonary window with the largest confluence of lymph nodes measuring 4 x 2.6 cm. There was hilar adenopathy on the left measuring 2 x 1.9 cm. There were multiple enlarged lymph nodes in the pretracheal region, largest 1.7 x 1.7 cm.  There were lymph nodes to the left of the carina measuring 2.8 x 1.9 cm. There was a subcarinal lymph node measuring 2.4 x 1.6 cm. There was a 4 x 3 mm nodular opacity in the anterior segment of the left upper lobe.    PET scan on 07/09/2016  revealed central left upper lobe/suprahilar primary bronchogenic carcinoma.  There was nodal metastasis within the chest and supraclavicular/low jugular regions.  There was multifocal osseous metastasis.  There was possibly a pathologic fracture involving the posteromedial right tenth rib.  There was new left lower lobe opacity with mild hypermetabolism, suspicious for infection or postobstructive pneumonitis.  He completed a course of Levaquin for apost-obstructive pneumonia.  He experienced transient positional left facial burning.  Head MRI on 07/26/2016 revealed no evidence of brain metastasis. There was punctate DWI hyperintensity along a high right frontal gyrus,  possible recent infarct.  There was a small occipital bone metastasis.  Head MRA on 07/27/2016 revealed normal large and medium sized vessels.  Bilateral carotid duplex on 07/27/2016 revealed <50% stenosis in the right and left internal carotis arteries.  Echo on 07/27/2016 revealed an EF of 45-50% without cardiac source of emboli.  He received 4 cycles of carboplatin and etoposide (07/31/2016 - 10/02/2016) with OnPro Neulasta support.  Platelet nadir was 40,000 on day 16 of cycle #2 and 23,000 on day 15 of cycle #3.  He received 2 units of PRBC with cycle #3 and 1 unit with cycle #4.  He received Xgeva on 08/13/2016 (last 10/30/2016).  Chest CT on 09/11/2016 revealed a marked response to therapy.  There was near complete resolution of left suprahilar mass and thoracic adenopathy.  There was interval sclerosis indicative of healing site of osseous metastasis.  Thoracic spine MRI on 10/22/2016 reveals sclerotic enhancing lesion at T7 vertebral body on the left compatible with metastasis. There was a healing fracture the right medial 10th rib.  PET scan on 11/12/2016 revealed a new 10 mm hypermetabolic left axillary lymph node (SUV 3.6) due to a left hand abscess.  There were no other sites of metabolically active disease.  He completed a course of thoracic spine radiation (3000 cGy in 10 fractions) on 01/11/2017.  He received PCI (3000 cGy in 15 fractions) from 01/28/2017 - 02/15/2017.  Chest, abdomen, and pelvic CT on 03/18/2017 revealed new areas of irregular ground-glass in the medial aspects of the right upper and right lower lobes as well as somewhat amorphous ground-glass in the lingula and left lower lobe. Time interval favored infectious or inflammatory lesions.  Osseous metastatic disease was stable.  There was resolved or resected left axillary lymph node.  Bone scan on 04/10/2017 revealed multiple foci of skeletal metastasis within the ribs, pelvis, and RIGHT clavicle.  Multiple areas of  activity corresponding to sclerotic lesions on CT and non-hypermetabolic lesions on PET scan.  PET scan on 04/26/2017 revealed bandlike densities medially in the right upper lobe and medially in the right lower lobe were new from 11/12/2016 but were present on 03/18/2017, and had faint hypermetabolic activity. Radiation pneumonitis could have this appearance.  The morphology would be unusual for malignancy but surveillance was warranted.  There were scattered stable sclerotic bony lesions that were not hypermetabolic.  There was persistent accentuated activity in the vicinity of the anus may be physiologic but was technically nonspecific.  LEFT hip radiograph on 09/02/2017 revealed degenerative changes in the bilateral hip joints, with the left being more significant than the right. There was no mention of new osseous lytic lesions. She  He has chemotherapy induced anemia.  Anemia work-up on 09/11/2016 revealed the following normal labs:  Ferritin (219), iron sat (28%), TIBC (227), B12 (3055), and folate (6.8).  He has folate deficiency.  Folate was 5.9 on 08/27/2017.  B12 was 542 on 08/27/2017.  He has a history of left lower extremity DVT a few months after his cardiac surgery in 08/2014.  He has peripheral vascular disease s/p stent placement in 03/2015.  He is on a Plavix.  He has not had a colonoscopy in 30 years.  He was ineligible for the AbbVie clinical trial (M16-298) secondary to his left hand abscess.  He underwent surgical debridement of a left hand abscess on 11/15/2016.  Wound cultures grew MRSA.  He was treated with emperic IV Vancomycin then Bactrim.   He was [facility] to [facility] from 08/26/2017 - 08/30/2017 with nausea, vomiting, dehydration and electrolyte abnormalities.  Head MRI without contrast on 08/29/2017 revealed no evidence of metastatic disease. Prior UGI revealed no evidence of obstruction or gastroparesis.  Labs revealed no evidence of pancreatitis.  He was started on scheduled  antiemetics and PPI BID.  AM cortisol on 08/28/2017 was low.  He was diagnosed with adrenal insufficiency.  He was started on prednisone 40 mg a day.  He was able to advance his diet.  Tube feeds were avoided.  Symptomatically, patient is s/p a traumatic fall over the weekend. He has multiple areas of abrasions to his RIGHT temple, RIGHT shoulder, and RIGHT knee. He complains on nausea with solid foods. He is drinking mainly supplement shakes at this point.  Weight is down another 5 pounds. He continues to have chronic pain in his back and legs. He now has acute pain following a fall over the weekend. Sodium is 129. Potassium 3.6. Magnesium is 1.4.   Plan: 1. Labs today: BMP, Mg, CEA 2. Review diagnostic plain films of LEFT hip - degenerative changes, with no new osseous lytic lesions.  3. Discuss status of referrals:  Endocrinology ([place]) [facility] has received the referral for patient to been seen to discuss adrenal insufficiency. No appointment has been scheduled at this point. Patient continues on previously prescribed steroid dose.   Pain management - patient has been referred to several pain management clinics. Refused to see Dr. Dossie Arbour. There are open referrals Dr. Nathanial Rancher (made by this clinic) and to Dr. Gillis Santa (made by cardiology). Patient encouraged to call both offices to follow up on the status of appointments.    4. Discuss protein calorie malnutrition. Weight down another 5 pounds.  Patient encouraged to increase intake of protein and calorie dense foods.  He was advised to take the oral supplements as recommended by cancer center RD. Additionally, he was encouraged to continue Ensure shakes to supplement his nutrition at least 2 - 3 times daily. patient to follow-up with RD on 09/12/2017. Discuss PEG tube placement, which he is now amenable to. Will discuss with Dr. Allen Norris. 5. Discuss HYPOnatremia and HYPOmagnesemia. Patient will need IVFs and supplemental magnesium  replacement today.  6. Discuss need for PET imaging to assess for active malignancy given ongoing weight loss issues and pain.  7. Discuss need for evaluation in the ED. Patient is weak. His Mg and Na are low. He has multiple areas of trauma related to recent fall. He requires further evaluation with imaging and treatment.  8. Discuss safety. Patient falling at home due to generalized weakness. He is requesting a wheelchair and handicap placard. Will oblige request for both.  9. RTC in 1 week for MD assessment, labs (BMP, Mg, Phos), and +/- IV Mg.   Honor Loh, NP  09/10/2017, 9:43 AM   I saw and evaluated the patient, participating in the key portions of the service and reviewing  pertinent diagnostic studies and records.  I reviewed the nurse practitioner's note and agree with the findings and the plan.  The assessment and plan were discussed with the patient.  Additional diagnostic studies of PET are needed to clarify disease status and would change the clinical management.  Multiple questions were asked by the patient and answered.   Nolon Stalls, MD 09/10/2017,9:43 AM

## 2017-09-10 ENCOUNTER — Emergency Department: Payer: Medicaid Other

## 2017-09-10 ENCOUNTER — Encounter: Payer: Self-pay | Admitting: Emergency Medicine

## 2017-09-10 ENCOUNTER — Telehealth: Payer: Self-pay | Admitting: Oncology

## 2017-09-10 ENCOUNTER — Emergency Department
Admission: EM | Admit: 2017-09-10 | Discharge: 2017-09-10 | Disposition: A | Discharge status: Home / Self Care | Payer: Medicaid Other | Attending: Emergency Medicine | Admitting: Emergency Medicine

## 2017-09-10 ENCOUNTER — Inpatient Hospital Stay: Payer: Medicaid Other

## 2017-09-10 ENCOUNTER — Other Ambulatory Visit: Payer: Self-pay

## 2017-09-10 ENCOUNTER — Inpatient Hospital Stay (HOSPITAL_BASED_OUTPATIENT_CLINIC_OR_DEPARTMENT_OTHER): Payer: Medicaid Other | Admitting: Hematology and Oncology

## 2017-09-10 ENCOUNTER — Encounter: Payer: Self-pay | Admitting: Hematology and Oncology

## 2017-09-10 ENCOUNTER — Telehealth: Payer: Self-pay | Admitting: Gastroenterology

## 2017-09-10 VITALS — BP 137/86 | HR 91 | Temp 96.1°F | Resp 18 | Wt 109.9 lb

## 2017-09-10 DIAGNOSIS — S40011A Contusion of right shoulder, initial encounter: Secondary | ICD-10-CM

## 2017-09-10 DIAGNOSIS — T451X5S Adverse effect of antineoplastic and immunosuppressive drugs, sequela: Secondary | ICD-10-CM

## 2017-09-10 DIAGNOSIS — Z79899 Other long term (current) drug therapy: Secondary | ICD-10-CM

## 2017-09-10 DIAGNOSIS — Z86718 Personal history of other venous thrombosis and embolism: Secondary | ICD-10-CM

## 2017-09-10 DIAGNOSIS — C7931 Secondary malignant neoplasm of brain: Secondary | ICD-10-CM | POA: Insufficient documentation

## 2017-09-10 DIAGNOSIS — Y9301 Activity, walking, marching and hiking: Secondary | ICD-10-CM | POA: Insufficient documentation

## 2017-09-10 DIAGNOSIS — R5383 Other fatigue: Secondary | ICD-10-CM | POA: Diagnosis not present

## 2017-09-10 DIAGNOSIS — F17210 Nicotine dependence, cigarettes, uncomplicated: Secondary | ICD-10-CM | POA: Insufficient documentation

## 2017-09-10 DIAGNOSIS — C3490 Malignant neoplasm of unspecified part of unspecified bronchus or lung: Secondary | ICD-10-CM | POA: Diagnosis not present

## 2017-09-10 DIAGNOSIS — E860 Dehydration: Secondary | ICD-10-CM | POA: Insufficient documentation

## 2017-09-10 DIAGNOSIS — R634 Abnormal weight loss: Secondary | ICD-10-CM | POA: Diagnosis not present

## 2017-09-10 DIAGNOSIS — E2740 Unspecified adrenocortical insufficiency: Secondary | ICD-10-CM | POA: Diagnosis not present

## 2017-09-10 DIAGNOSIS — Z9221 Personal history of antineoplastic chemotherapy: Secondary | ICD-10-CM

## 2017-09-10 DIAGNOSIS — I2510 Atherosclerotic heart disease of native coronary artery without angina pectoris: Secondary | ICD-10-CM | POA: Insufficient documentation

## 2017-09-10 DIAGNOSIS — S40211A Abrasion of right shoulder, initial encounter: Secondary | ICD-10-CM | POA: Insufficient documentation

## 2017-09-10 DIAGNOSIS — Z951 Presence of aortocoronary bypass graft: Secondary | ICD-10-CM | POA: Insufficient documentation

## 2017-09-10 DIAGNOSIS — E538 Deficiency of other specified B group vitamins: Secondary | ICD-10-CM

## 2017-09-10 DIAGNOSIS — R110 Nausea: Secondary | ICD-10-CM

## 2017-09-10 DIAGNOSIS — M1990 Unspecified osteoarthritis, unspecified site: Secondary | ICD-10-CM

## 2017-09-10 DIAGNOSIS — S8001XA Contusion of right knee, initial encounter: Secondary | ICD-10-CM

## 2017-09-10 DIAGNOSIS — E785 Hyperlipidemia, unspecified: Secondary | ICD-10-CM

## 2017-09-10 DIAGNOSIS — C801 Malignant (primary) neoplasm, unspecified: Secondary | ICD-10-CM

## 2017-09-10 DIAGNOSIS — G8929 Other chronic pain: Secondary | ICD-10-CM

## 2017-09-10 DIAGNOSIS — I252 Old myocardial infarction: Secondary | ICD-10-CM | POA: Diagnosis not present

## 2017-09-10 DIAGNOSIS — D6481 Anemia due to antineoplastic chemotherapy: Secondary | ICD-10-CM | POA: Diagnosis not present

## 2017-09-10 DIAGNOSIS — R5381 Other malaise: Secondary | ICD-10-CM

## 2017-09-10 DIAGNOSIS — S098XXA Other specified injuries of head, initial encounter: Secondary | ICD-10-CM | POA: Diagnosis present

## 2017-09-10 DIAGNOSIS — E43 Unspecified severe protein-calorie malnutrition: Secondary | ICD-10-CM

## 2017-09-10 DIAGNOSIS — S0081XA Abrasion of other part of head, initial encounter: Secondary | ICD-10-CM | POA: Insufficient documentation

## 2017-09-10 DIAGNOSIS — I119 Hypertensive heart disease without heart failure: Secondary | ICD-10-CM | POA: Insufficient documentation

## 2017-09-10 DIAGNOSIS — M545 Low back pain: Secondary | ICD-10-CM

## 2017-09-10 DIAGNOSIS — M549 Dorsalgia, unspecified: Secondary | ICD-10-CM

## 2017-09-10 DIAGNOSIS — Z923 Personal history of irradiation: Secondary | ICD-10-CM

## 2017-09-10 DIAGNOSIS — W010XXA Fall on same level from slipping, tripping and stumbling without subsequent striking against object, initial encounter: Secondary | ICD-10-CM | POA: Insufficient documentation

## 2017-09-10 DIAGNOSIS — R197 Diarrhea, unspecified: Secondary | ICD-10-CM | POA: Diagnosis not present

## 2017-09-10 DIAGNOSIS — C7949 Secondary malignant neoplasm of other parts of nervous system: Secondary | ICD-10-CM | POA: Insufficient documentation

## 2017-09-10 DIAGNOSIS — R350 Frequency of micturition: Secondary | ICD-10-CM

## 2017-09-10 DIAGNOSIS — Z7902 Long term (current) use of antithrombotics/antiplatelets: Secondary | ICD-10-CM | POA: Insufficient documentation

## 2017-09-10 DIAGNOSIS — Y92828 Other wilderness area as the place of occurrence of the external cause: Secondary | ICD-10-CM | POA: Diagnosis not present

## 2017-09-10 DIAGNOSIS — Z7952 Long term (current) use of systemic steroids: Secondary | ICD-10-CM

## 2017-09-10 DIAGNOSIS — T148XXA Other injury of unspecified body region, initial encounter: Secondary | ICD-10-CM

## 2017-09-10 DIAGNOSIS — M792 Neuralgia and neuritis, unspecified: Secondary | ICD-10-CM

## 2017-09-10 DIAGNOSIS — W01198D Fall on same level from slipping, tripping and stumbling with subsequent striking against other object, subsequent encounter: Secondary | ICD-10-CM

## 2017-09-10 DIAGNOSIS — C7951 Secondary malignant neoplasm of bone: Secondary | ICD-10-CM | POA: Diagnosis not present

## 2017-09-10 DIAGNOSIS — C771 Secondary and unspecified malignant neoplasm of intrathoracic lymph nodes: Secondary | ICD-10-CM

## 2017-09-10 DIAGNOSIS — I739 Peripheral vascular disease, unspecified: Secondary | ICD-10-CM

## 2017-09-10 DIAGNOSIS — M25552 Pain in left hip: Secondary | ICD-10-CM

## 2017-09-10 DIAGNOSIS — Z800 Family history of malignant neoplasm of digestive organs: Secondary | ICD-10-CM

## 2017-09-10 DIAGNOSIS — Z801 Family history of malignant neoplasm of trachea, bronchus and lung: Secondary | ICD-10-CM

## 2017-09-10 DIAGNOSIS — Z7189 Other specified counseling: Secondary | ICD-10-CM

## 2017-09-10 DIAGNOSIS — Y999 Unspecified external cause status: Secondary | ICD-10-CM | POA: Diagnosis not present

## 2017-09-10 DIAGNOSIS — R600 Localized edema: Secondary | ICD-10-CM

## 2017-09-10 DIAGNOSIS — S80211A Abrasion, right knee, initial encounter: Secondary | ICD-10-CM | POA: Insufficient documentation

## 2017-09-10 DIAGNOSIS — Z803 Family history of malignant neoplasm of breast: Secondary | ICD-10-CM

## 2017-09-10 LAB — BASIC METABOLIC PANEL
Anion gap: 8 (ref 5–15)
BUN: 5 mg/dL — ABNORMAL LOW (ref 6–20)
CO2: 22 mmol/L (ref 22–32)
Calcium: 8.5 mg/dL — ABNORMAL LOW (ref 8.9–10.3)
Chloride: 99 mmol/L — ABNORMAL LOW (ref 101–111)
Creatinine, Ser: 0.66 mg/dL (ref 0.61–1.24)
GFR calc Af Amer: >60 mL/min (ref >60)
GFR calc non Af Amer: >60 mL/min (ref >60)
Glucose, Bld: 72 mg/dL (ref 65–99)
Potassium: 3.6 mmol/L (ref 3.5–5.1)
Sodium: 129 mmol/L — ABNORMAL LOW (ref 135–145)

## 2017-09-10 LAB — MAGNESIUM: Magnesium: 1.4 mg/dL — ABNORMAL LOW (ref 1.7–2.4)

## 2017-09-10 MED ORDER — SODIUM CHLORIDE 0.9% FLUSH
10.0000 mL | INTRAVENOUS | Status: AC | PRN
  Administered 2017-09-10: 10 mL via INTRAVENOUS
  Filled 2017-09-10: qty 10

## 2017-09-10 MED ORDER — SILVER SULFADIAZINE 1 % EX CREA
TOPICAL_CREAM | CUTANEOUS | Status: AC
  Filled 2017-09-10: qty 85

## 2017-09-10 MED ORDER — SULFAMETHOXAZOLE-TRIMETHOPRIM 800-160 MG PO TABS: 1.0000 | ORAL_TABLET | Freq: Once | ORAL | Status: DC

## 2017-09-10 MED ORDER — SODIUM CHLORIDE 0.9 % IV BOLUS
1000.0000 mL | Freq: Once | INTRAVENOUS | Status: AC
  Administered 2017-09-10: 1000 mL via INTRAVENOUS

## 2017-09-10 MED ORDER — HEPARIN SOD (PORK) LOCK FLUSH 100 UNIT/ML IV SOLN: 500.0000 [IU] | Freq: Once | INTRAVENOUS | Status: AC

## 2017-09-10 MED ORDER — SILVER SULFADIAZINE 1 % EX CREA
TOPICAL_CREAM | Freq: Once | CUTANEOUS | Status: AC
  Administered 2017-09-10: 12:00:00 via TOPICAL

## 2017-09-10 MED ORDER — CEPHALEXIN 250 MG PO CAPS: 250.0000 mg | ORAL_CAPSULE | Freq: Four times a day (QID) | ORAL | 0 refills | Status: AC

## 2017-09-10 MED ORDER — MAGNESIUM SULFATE 2 GM/50ML IV SOLN
2.0000 g | Freq: Once | INTRAVENOUS | Status: AC
  Administered 2017-09-10: 2 g via INTRAVENOUS
  Filled 2017-09-10: qty 50

## 2017-09-10 MED ORDER — OXYCODONE HCL 5 MG PO TABS
5.0000 mg | ORAL_TABLET | ORAL | Status: AC
  Administered 2017-09-10: 5 mg via ORAL
  Filled 2017-09-10: qty 1

## 2017-09-10 MED ORDER — CEPHALEXIN 250 MG PO CAPS
250.0000 mg | ORAL_CAPSULE | Freq: Once | ORAL | Status: AC
  Administered 2017-09-10: 250 mg via ORAL
  Filled 2017-09-10 (×2): qty 1

## 2017-09-10 MED ORDER — HEPARIN SOD (PORK) LOCK FLUSH 100 UNIT/ML IV SOLN
INTRAVENOUS | Status: AC
  Administered 2017-09-10: 500 [IU]
  Filled 2017-09-10: qty 5

## 2017-09-10 NOTE — ED Notes (Signed)
Pt given cola

## 2017-09-10 NOTE — ED Notes (Signed)
Pt's wounds cleaned up and silvadene applied. Bandages applied to right side of face, right shoulder and right knee.

## 2017-09-10 NOTE — ED Triage Notes (Signed)
Patient presents to the ED from the [facility].  Patient states he tripped over a root in the ground on Saturday and when he went to the Cancer center today, staff felt like patient's wounds needed to be, "cleaned up" and sent patient to the ED.  Patient has history of stage 4 lung cancer that has metastasized to spine and brain.  Patient reports history of MRSA.

## 2017-09-10 NOTE — Telephone Encounter (Signed)
Patient called answering service and requests call back.  Call patient he reports that he was told today that his pain medication will be called in.  Explained to patient that narcotics can not be called in for him during off hours by covering physician. Advise patient to call Dr.Corcoran's team in the morning to discuss. He voices understanding.

## 2017-09-10 NOTE — ED Notes (Addendum)
Spoke with Honor Loh NP  I was informed that pt was sent to ED for fluids and possible mag bolus  Spoke with charge nurse   He will move to major  Labs have been drawn at the Cancer this am

## 2017-09-10 NOTE — ED Notes (Signed)
See triage note  States he tripped couple of days ago   Landed on right side  Abrasion noted to right side of face,right shoulder and right knee

## 2017-09-10 NOTE — ED Provider Notes (Addendum)
[facility] Emergency Department Provider Note   ____________________________________________   First MD Initiated Contact with Patient 09/10/17 1120     (approximate)  I have reviewed the triage vital signs and the nursing notes.   HISTORY  Chief Complaint Fall    HPI Philip [name] is a 57 y.o. male patient was sent over from the cancer center.  Patient went today for his standard treatment but clinic noticed he had multiple abrasions secondary to a trip and fall 3 days ago.  There is also concern for dehydration secondary to increase fluid and food intake.  Cancer center felt that his wounds need to be  clean before he started his injections.  Patient has a history of stage IV lung disease with metastasis to the spine and brain.  Patient has a history of MRSA.  Past Medical History:  Diagnosis Date  . Anemia   . CAD (coronary artery disease)    a. 08/2014 Inf STEMI/CABG x 3 (LIMA->LAD, VG->Diag, RIMA->RCA).  . DVT, recurrent, lower extremity, acute, left ([place]) 2016  . GIB (gastrointestinal bleeding)    a. In the setting of DAPT-->tolerating plavix only.  . Hyperlipidemia   . Hypertensive heart disease   . Lung cancer ([place])    a. s/p chemo/radiation.  Marland Kitchen PAD (peripheral artery disease) ([place])    a. 03/2015 Periph Angio: short occlusion of L pop w/ evidence of embolization into the DP->5.0x50 mm Inova self-expanding stent;  b. 04/08/2015 ABI: R 1.12, L 0.99; c. 01/2017 LE Duplex: bilat SFA obstructive dzs, patent L pop.  . Tobacco abuse   . Toe amputation status, left [facility]) 07/09/2016   2017    Patient Active Problem List   Diagnosis Date Noted  . Folate deficiency 09/10/2017  . Adrenal insufficiency ([place]) 09/02/2017  . Intractable nausea and vomiting 08/27/2017  . Protein-calorie malnutrition, severe 08/27/2017  . Weakness 08/02/2017  . Lung cancer metastatic to bone ([place]) 04/24/2017  . 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 Marijuana  user 04/24/2017  . Abnormal drug screen 04/24/2017  . Skin abscess 04/17/2017  . Non-intractable vomiting with nausea 01/20/2017  . Hypokalemia 01/20/2017  . Dehydration 01/20/2017  . Abscess of left hand 11/13/2016  . Chronic thoracic spine pain 10/28/2016  . Antineoplastic chemotherapy induced anemia 10/28/2016  . Encounter for antineoplastic chemotherapy 09/10/2016  . Charcot's joint of foot (Right) 08/21/2016  . GERD (gastroesophageal reflux disease) 08/21/2016  . Myocardial infarction ([place]) 08/21/2016  . Hyponatremia 08/19/2016  . Hypocalcemia 08/19/2016  . Hypomagnesemia 08/19/2016  . Routine history and physical examination of adult 08/09/2016  . Cancer of upper lobe of left lung ([place]) 07/31/2016  . Cancer related pain 07/31/2016  . Cough 07/31/2016  . Goals of care, counseling/discussion 07/26/2016  . Acute CVA (cerebrovascular accident) ([place]) 07/26/2016  . 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 Postobstructive pneumonia 07/17/2016  . Bone metastasis ([place]) 07/12/2016  . Lymphadenopathy, mediastinal 07/02/2016  . Weight loss 07/02/2016  . Night sweats 07/02/2016  . Chronic foot pain (Left) 03/21/2016  . Chronic pain syndrome 03/21/2016  . Neuropathic pain 03/21/2016  . Iron deficiency anemia 01/08/2016  . Dry gangrene ([place]) 01/02/2016  . Osteopenia of both feet 12/10/2015  . Peripheral neuropathy 06/12/2015  . Hyperlipidemia, unspecified   . PAD (peripheral artery disease) ([place]) 03/29/2015  . Hypertension 10/05/2014  . Pure hypercholesterolemia 10/05/2014  . Acute sinusitis, unspecified 09/14/2014  . Rash and other nonspecific skin eruption 09/14/2014  . S/P CABG x 3  08/25/2014  . Status post aorto-coronary artery bypass graft 08/25/2014  . History of MI (myocardial infarction) 08/24/2014  . CAD (coronary artery disease), native coronary artery 08/24/2014  . Acute myocardial infarction of inferior wall ([place]) 08/24/2014  . Right ankle pain 07/20/2014  . Pain  in joint involving ankle and foot 07/20/2014  . Bilateral foot pain 07/06/2014  . Tobacco use disorder 07/06/2014  . Convulsions ([place]) 07/06/2014  . Preventative health care 07/06/2014  . Pain in soft tissues of limb 07/06/2014    Past Surgical History:  Procedure Laterality Date  . CARDIAC CATHETERIZATION N/A 08/24/2014   Procedure: Left Heart Cath and Coronary Angiography;  Surgeon: Isaias Cowman, MD;  Location: [place] [facility];  Service: Cardiovascular;  Laterality: N/A;  . CARDIAC CATHETERIZATION N/A 08/24/2014   Procedure: Coronary Stent Intervention;  Surgeon: Isaias Cowman, MD;  Location: [place] [facility];  Service: Cardiovascular;  Laterality: N/A;  . CORONARY ARTERY BYPASS GRAFT N/A 08/25/2014   Procedure: CORONARY ARTERY BYPASS GRAFTING (CABG), ON PUMP, TIMES THREE, USING BILATERAL MAMMARY ARTERIES, RIGHT GREATER SAPHENOUS VEIN HARVESTED ENDOSCOPICALLY;  Surgeon: Melrose Nakayama, MD;  Location: [place];  Service: Open Heart Surgery;  Laterality: N/A;  . ESOPHAGOGASTRODUODENOSCOPY (EGD) WITH PROPOFOL N/A 08/08/2017   Procedure: ESOPHAGOGASTRODUODENOSCOPY (EGD) WITH PROPOFOL;  Surgeon: Jonathon Bellows, MD;  Location: [facility] END[facility]COPY;  Service: Gastroenterology;  Laterality: N/A;  . INCISION AND DRAINAGE ABSCESS Left 11/15/2016   Procedure: INCISION AND DRAINAGE ABSCESS OF LEFT WRIST;  Surgeon: Roseanne Kaufman, MD;  Location: [place];  Service: Orthopedics;  Laterality: Left;  . PERIPHERAL VASCULAR CATHETERIZATION N/A 03/30/2015   Procedure: Abdominal Aortogram w/Lower Extremity;  Surgeon: Wellington Hampshire, MD;  Location: [place] [facility];  Service: Cardiovascular;  Laterality: N/A;  . PORTA CATH INSERTION N/A 07/27/2016   Procedure: Glori Luis Cath Insertion;  Surgeon: Algernon Huxley, MD;  Location: [place] [facility];  Service: Cardiovascular;  Laterality: N/A;  . TEE WITHOUT CARDIOVERSION N/A 08/25/2014   Procedure: TRANSESOPHAGEAL ECHOCARDIOGRAM (TEE);  Surgeon: Melrose Nakayama, MD;  Location: [place];  Service: Open Heart Surgery;  Laterality: N/A;    Prior to Admission medications   Medication Sig Start Date End Date Taking? Authorizing Provider  atorvastatin (LIPITOR) 40 MG tablet TAKE 1 TABLET BY MOUTH DAILY 08/02/16   Wellington Hampshire, MD  bisacodyl (DULCOLAX) 5 MG EC tablet Take 1 tablet (5 mg total) by mouth daily as needed for moderate constipation. 08/30/17   Vaughan Basta, MD  Calcium Carb-Cholecalciferol (CALCIUM + D3 PO) Take 1 tablet by mouth 3 (three) times daily.    [provider]  clopidogrel (PLAVIX) 75 MG tablet Take 1 tablet (75 mg total) by mouth daily. 11/14/16   Wellington Hampshire, MD  clotrimazole (MYCELEX) 10 MG troche Take 1 lozenge (10 mg total) by mouth 4 (four) times daily. 09/02/17   Karen Kitchens, NP  diphenhydrAMINE (BENADRYL) 25 mg capsule Take 25 mg by mouth 2 (two) times daily as needed for allergies.     [provider]  famotidine (PEPCID) 20 MG tablet Take 1 tablet (20 mg total) by mouth 2 (two) times daily. 08/30/17   Vaughan Basta, MD  feeding supplement, ENSURE ENLIVE, (ENSURE ENLIVE) LIQD Take 237 mLs by mouth 3 (three) times daily between meals. 08/30/17   Vaughan Basta, MD  FLOVENT HFA 220 MCG/ACT inhaler INL 2 PFS PO BID 02/01/17   [provider]  folic acid (FOLVITE) 1 MG tablet Take 1 tablet (1  mg total) by mouth daily. 08/30/17   Vaughan Basta, MD  lidocaine-prilocaine (EMLA) cream Apply to affected area once 07/31/16   Lequita Asal, MD  magnesium oxide (MAG-OX) 400 (241.3 Mg) MG tablet Take 1 tablet (400 mg total) by mouth 3 (three) times daily. 05/16/17   Karen Kitchens, NP  metoCLOPramide (REGLAN) 5 MG tablet Take 1 tablet (5 mg total) by mouth 4 (four) times daily -  before meals and at bedtime. 08/30/17   Vaughan Basta, MD  metoprolol tartrate (LOPRESSOR) 25 MG tablet TAKE 1 TABLET BY MOUTH TWICE DAILY 12/05/16   Wellington Hampshire, MD    Multiple Vitamin (MULTIVITAMIN WITH MINERALS) TABS tablet Take 1 tablet by mouth daily. 08/31/17   Vaughan Basta, MD  OLANZapine zydis (ZYPREXA) 10 MG disintegrating tablet Take 1 tablet (10 mg total) by mouth at bedtime. 08/30/17   Vaughan Basta, MD  ondansetron (ZOFRAN-ODT) 8 MG disintegrating tablet Take 1 tablet (8 mg total) by mouth every 8 (eight) hours as needed for nausea or vomiting. 08/02/17   Karen Kitchens, NP  potassium phosphate, monobasic, (K-PHOS ORIGINAL) 500 MG tablet Take 1 tablet (500 mg total) by mouth 4 (four) times daily -  with meals and at bedtime. 08/30/17   Vaughan Basta, MD  predniSONE (DELTASONE) 10 MG tablet Take 40 mg oral daily for 2 weeks ( until 09/12/17) Then take 30 mg oral daily for next 2 weeks ( 09/13/17- 09/26/17) Then take 20 mg oral daily for next 2 weeks  09/27/17- 10/11/17) Then take 10 mg oral daily for next 2 weeks. 08/30/17   Vaughan Basta, MD  PROAIR HFA 108 [phone] Base) MCG/ACT inhaler INL 2 PFS PO Q 4 TO 6 H PRN 02/01/17   [provider]  promethazine (PHENERGAN) 25 MG suppository Place 1 suppository (25 mg total) rectally every 6 (six) hours as needed for nausea or vomiting. 04/11/17   Lequita Asal, MD  senna-docusate (SENOKOT-S) 8.6-50 MG tablet Take 1 tablet by mouth at bedtime as needed for mild constipation. 08/30/17   Vaughan Basta, MD  STIOLTO RESPIMAT 2.5-2.5 MCG/ACT AERS INL 2 PFS PO QD 02/04/17   [provider]  thiamine 100 MG tablet Take 1 tablet (100 mg total) by mouth daily. 08/31/17   Vaughan Basta, MD  traMADol (ULTRAM) 50 MG tablet Take 1 tablet (50 mg total) by mouth every 6 (six) hours as needed. Patient not taking: Reported on 09/10/2017 08/02/17   Karen Kitchens, NP  vitamin C (VITAMIN C) 500 MG tablet Take 1 tablet (500 mg total) by mouth 2 (two) times daily. 08/30/17   Vaughan Basta, MD  Vitamin D, Ergocalciferol, (DRISDOL) 50000 units CAPS capsule TK ONE C PO  Q WEEK 03/11/17   [provider]    Allergies Tylenol [acetaminophen]; Amitriptyline; Aspirin; Contrast media [iodinated diagnostic agents]; Nsaids; Ibuprofen; and Tape  Family History  Problem Relation Age of Onset  . Hypertension Father   . Lung cancer Father   . Heart attack Mother 57       STENT  . Hyperlipidemia Mother   . Heart attack Brother   . Heart attack Brother   . Breast cancer Sister   . Heart attack Sister   . Colon cancer Paternal Grandfather     Social History Social History   Tobacco Use  . Smoking status: Current Every Day Smoker    Packs/day: 0.25    Types: Cigarettes  . Smokeless tobacco: Never Used  . Tobacco comment: 5-6  cigs in a week.   Substance Use Topics  . Alcohol use: Yes    Alcohol/week: 0.0 oz    Comment: Sometimes on w/e.  . Drug use: No    Review of Systems  Constitutional: No fever/chills Eyes: No visual changes. ENT: No sore throat. Cardiovascular: Denies chest pain. Respiratory: Denies shortness of breath. Gastrointestinal: No abdominal pain.  No nausea, no vomiting.  No diarrhea.  No constipation. Genitourinary: Negative for dysuria. Musculoskeletal: Negative for back pain. Skin: Negative for rash. Neurological: Negative for headaches, focal weakness or numbness. Endocrine:Hypertension hyperlipidemia. Hematological/Lymphatic:Lung cancer. Allergic/Immunilogical: See medication list  ____________________________________________   PHYSICAL EXAM:  VITAL SIGNS: ED Triage Vitals [09/10/17 1054]  Enc Vitals Group     BP (!) 142/97     Pulse Rate 97     Resp 16     Temp 98 F (36.7 C)     Temp Source Oral     SpO2 100 %     Weight 109 lb 14.4 oz (49.9 kg)     Height 5\' 11"  (1.803 m)     Head Circumference      Peak Flow      Pain Score 8     Pain Loc      Pain Edu?      Excl. in [place]?    Constitutional: Alert and oriented. Well appearing and in no acute distress. Eyes: Conjunctivae are normal. PERRL.  EOMI. Head: Atraumatic. Nose: No congestion/rhinnorhea. Mouth/Throat: Mucous membranes are dry.  Oropharynx non-erythematous. Neck: No stridor. Hematological/Lymphatic/Immunilogical: No cervical lymphadenopathy. Cardiovascular: Normal rate, regular rhythm. Grossly normal heart sounds.  Good peripheral circulation. Respiratory: Normal respiratory effort.  No retractions. Lungs CTAB. Musculoskeletal: No lower extremity tenderness nor edema.  No joint effusions. Neurologic:  Normal speech and language. No gross focal neurologic deficits are appreciated. No gait instability. Skin: Multiple erythematous abrasions affecting face, right shoulder, and right knee.   Psychiatric: Mood and affect are normal. Speech and behavior are normal.  ____________________________________________   LABS (all labs ordered are listed, but only abnormal results are displayed)  Labs Reviewed - No data to display ____________________________________________  EKG   ____________________________________________  RADIOLOGY  ED MD interpretation:    Official radiology report(s): Dg Shoulder Right  Result Date: 09/10/2017 CLINICAL DATA:  Trip and fall 3 days ago. EXAM: RIGHT SHOULDER - 2+ VIEW COMPARISON:  None. FINDINGS: The humeral head is well-formed and located. The subacromial, glenohumeral and acromioclavicular joint spaces are intact. No destructive bony lesions. Soft tissue planes are non-suspicious. RIGHT chest Port-A-Cath. Status post median sternotomy. Calcified aortic arch. Faint calcifications RIGHT neck are likely vascular. IMPRESSION: Negative. Aortic Atherosclerosis (ICD10-I70.0). Electronically Signed   By: Elon Alas M.D.   On: 09/10/2017 13:34   Ct Head Wo Contrast  Result Date: 09/10/2017 CLINICAL DATA:  Initial evaluation for acute trauma, headache. EXAM: CT HEAD WITHOUT CONTRAST TECHNIQUE: Contiguous axial images were obtained from the base of the skull through the vertex without  intravenous contrast. COMPARISON:  Prior MRI from 08/28/2017. FINDINGS: Brain: Generalized age-related cerebral atrophy with chronic small vessel ischemic disease, stable. No acute intracranial hemorrhage. No acute large vessel territory infarct. No mass lesion, midline shift or mass effect. No hydrocephalus. No extra-axial fluid collection. Vascular: No hyperdense vessel. Scattered vascular calcifications noted within the carotid siphons. Skull: Scalp soft tissues within normal limits.  Calvarium intact. Sinuses/Orbits: Visualized globes and orbital soft tissues within normal limits. Visualized paranasal sinuses are clear. Small bilateral mastoid effusions, likely benign. Other: None.  IMPRESSION: 1. No acute intracranial abnormality. 2. Atrophy with moderate chronic small vessel ischemic disease, stable. Electronically Signed   By: Jeannine Boga M.D.   On: 09/10/2017 13:41    ____________________________________________   PROCEDURES  Procedure(s) performed:   Procedures  Critical Care performed:   ____________________________________________   INITIAL IMPRESSION / ASSESSMENT AND PLAN / ED COURSE  As part of my medical decision making, I reviewed the following data within the [place]    Patient presents with multiple abrasions secondary to fall 3 days ago.  Abrasion were cleaned and bandaged.  Per discussion with Dr. Quentin Cornwall patient  sent over to major for definitive evaluation and treatment.   ____________________________________________   FINAL CLINICAL IMPRESSION(S) / ED DIAGNOSES  Final diagnoses:  Abrasion     ED Discharge Orders    None       Note:  This document was prepared using Dragon voice recognition software and may include unintentional dictation errors.    Sable Feil, PA-C 09/10/17 1210    Sable Feil, PA-C 09/10/17 1214    Sable Feil, PA-C 09/10/17 1215    Sable Feil, PA-C 09/10/17 1532      Merlyn Lot, MD 09/17/17 [phone]

## 2017-09-10 NOTE — ED Notes (Signed)
First Nurse Note:  Patient to ED via [facility] from [facility], states he fell.

## 2017-09-10 NOTE — Progress Notes (Signed)
Patient here today for follow up. States he had a fall this past Saturday 09/07/17. Abrasion to left side of forehead, right knee and bruising to left hand. He states he is feeling weak and is unable to walk due to pain. "Its hard for me to even stand."

## 2017-09-10 NOTE — ED Provider Notes (Signed)
Medical screening examination/treatment/procedure(s) were conducted as a shared visit with non-physician practitioner(s) and myself.  I personally evaluated the patient during the encounter.  ----------------------------------------- 1:20 PM on 09/10/2017 -----------------------------------------  Patient staffed with physician assistant.  Eyes personally saw and evaluated the patient this time.  He does have multiple abrasions including right forehead, right shoulder and right knee.  None of which appear deep or superinfected.  They all appear to be abrasions.  [place]  He also receives regular infusions and went to the cancer Center today for his regular infusion and was referred here for evaluation of his abrasions.  Notes from cancer center and discussion with oncology Dr. Tasia Catchings and referred me to Dr. Mike Gip.  I spoke with Dr. Mike Gip, she knows the patient well and wanted him checked over for his fall, but did suggest we continue 1 L of normal saline and his magnesium which she was supposed to receive today which we will provide here.  Will provide IV fluids, IV magnesium.  In addition due to his injuries will obtain an x-ray of the right shoulder as he reports notable soreness about the area but no frank deformity or deficits.  Right knee does have abrasion seems minor is able to range through full range of motion without deformity or injury and reports he does not feel he needs an x-ray and I would agree it is unlikely broken.  In addition we will order CT the head is a cancer patient with a notable fall and head injury, reports headaches but reports it is almost chronic and daily due to fatigue and chronic dehydration.  Is able to tolerate by mouth including Ensure drinks, but not much else has been ongoing issue for quite some time.  He has plans to see gastroenterology for possible gastric tube placement this week.  Clinical Course as of Sep 11 1335  Tue Sep 10, 2017  1329 Dr. Janece Canterbury knows well. Advises give 1 liter NS and 2 grams magnesium. Oncology is currently obtaining services for patient, wheelchair, and is aware of his ongoing issues with poor function. Oncology recommends discharge with close ongoing outpatient follow-up.    [MQ]    Clinical Course User Index [MQ] Delman Kitten, MD    ----------------------------------------- 4:23 PM on 09/10/2017 -----------------------------------------  Patient reports he feels well.  Is ambulated without difficulty.  Comfortable with plan for discharge.  Reviewed x-rays, no acute disease no acute intracranial hemorrhage.  Patient appears well.  Ready for discharge.  Will place on cephalexin as prophylaxis to prevent superinfection of his abrasions as physician assistant had noted some purulence or potential purulence on his abrasions when he was first calm.  At this point, I see no signs of infection, surrounding cellulitis, but if he did in fact have some mild purulent drainage placed on cephalexin for further prevention.  Return precautions and treatment recommendations and follow-up discussed with the patient who is agreeable with the plan.     Delman Kitten, MD 09/10/17 [phone]

## 2017-09-10 NOTE — ED Notes (Signed)
Report called to Gregor Hams

## 2017-09-10 NOTE — Telephone Encounter (Signed)
Philip [name] from Dr. Landry Corporal LEFT VM FOR [name] IN REGARDS  TO PT TO SET UP  Peg 2 PLACEMENT to SET UP please call her at cb  [phone]

## 2017-09-11 ENCOUNTER — Telehealth: Payer: Self-pay | Admitting: *Deleted

## 2017-09-11 ENCOUNTER — Other Ambulatory Visit: Payer: Self-pay

## 2017-09-11 DIAGNOSIS — R634 Abnormal weight loss: Secondary | ICD-10-CM

## 2017-09-11 DIAGNOSIS — E860 Dehydration: Secondary | ICD-10-CM

## 2017-09-11 LAB — CEA: CEA: 2.6 ng/mL (ref 0.0–4.7)

## 2017-09-11 NOTE — Telephone Encounter (Signed)
Are you or Rodena Piety planning to call him about the referral? I'm not sure he will accept it after the way he sounded when I last spoke with him

## 2017-09-11 NOTE — Telephone Encounter (Signed)
Patient called back and apologized for his actions this morning and states that he does want to keep his appts as scheduled for PET and see doctor Monday and Tuesday of next week and states he did not appreciate being lied to and he will discuss this with [facility] when he sees her next week

## 2017-09-11 NOTE — Telephone Encounter (Signed)
Just heard back from my pain clinic referral. Rodena Piety and I have been working on this for quite some time. [facility] has made patient an with one of their providers on 10/28/2017 at 0930. They are asking for a 24 hour cancellation notice if he is not going to attend this appointment. This will be the second pain clinic that I have personally set him up with. Cardiology still has a pending referral in to Dr. Gilford Raid.   I am trying to work with him. I do not want him to be in pain. I am sorry that he is upset. However, I am limited on what I can do due to this being chronic pain. As I said, I am concerned about his safety. I am not refusing to treat him. I am offering Tramadol, which is an appropriate intervention for him.   Honor Loh, MSN, APRN, FNP-C, CEN Oncology/Hematology Nurse Practitioner  [facility] 09/11/17, 11:29 AM

## 2017-09-11 NOTE — Telephone Encounter (Signed)
I spoke with Colette and she said that his wife was given the appts before he left for the ER yesterday, I called patient and advised him of this and he said oh, ok. I then advised him of the pain clinic appts that he has not made and that [facility] not comfortable giving him more than Tramadol with his weakness and falls. He became upset and said "don't worry about sesnding anything in and I'm not coming in for any appts either, just forget everything. I'm not coming back." then he hung up the phone abruptly

## 2017-09-11 NOTE — Telephone Encounter (Signed)
  Can we get him a small quantity until he gets to the pain clinic?  M

## 2017-09-11 NOTE — Telephone Encounter (Signed)
His appointments were made yesterday. He went from the clinic to the ED, so I assume that he did not stop and pick up AVS. PET scan and RTC are scheduled for next week.  Regarding pain medications. He has 2 open referrals to pain clinics. He checked with one yesterday, and I just called the other. We still do not have a disposition. In the interim, I am willing to do Tramadol if he would like that. Dr. Mike Gip may be willing to do something else, however at this point I do not feel comfortable doing stronger narcotics. He is weak and falling. I have concerns.

## 2017-09-11 NOTE — Telephone Encounter (Signed)
Fax received from [facility] that patient has an appointment on 10-28-17 @ 9:03 am with Robb Matar, NP. Copy sent to scan and copy mailed to patient today (Wednesday, May 29, 20190.

## 2017-09-11 NOTE — Telephone Encounter (Signed)
Per patient message, we were to reorder 2 of his pain medications yesterday and we did not, he is also inquiring about appointments we were to arrange for him for a PET and his treatment , and to see Dr Allen Norris. Please advise

## 2017-09-12 ENCOUNTER — Telehealth: Payer: Self-pay

## 2017-09-12 ENCOUNTER — Other Ambulatory Visit: Payer: Self-pay

## 2017-09-12 NOTE — Telephone Encounter (Signed)
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    Noted in review of chart patient with orders for PEG placement by Dr. Dashiell Franchino Norris on 6/3 but orders cancelled.    Called patient for nutrition follow-up.  Patient reports appetite is still not good and planning feeding tube placement.  Reports that he has to be off plavix for 5 days before they will schedule the feeding tube placement.  Does not know when the tube will be placed.  Asking about the tube and reports he has questions.     Medications: reviewed  Labs: reviewed  Anthropometrics:   Weight decreased to 109 lb on 5/29 from 118 lb 11.2 oz on 5/20   Re-Estimated Energy Needs  Kcals: 1500-1750 calories/d Protein: 75-88 g/d Fluid: 1.7 L/d  NUTRITION DIAGNOSIS: Malnutrition continues   MALNUTRITION DIAGNOSIS: Severe malnutrition continues   INTERVENTION:  Patient agreeable to meeting with RD for tube feeding teaching on Monday, June 3 at 9am to discuss tube feeding regimen and see sample tube.   Patient will be at refeeding risk once starting tube feeding especially with continued weight loss following hospital admission so will need to titrate slow and check C-Met, phosphorus and Magnesium as tube feeding is titrated.      MONITORING, EVALUATION, GOAL: weight trends, intake   NEXT VISIT: June 3 at [facility]. Zenia Resides, [place], [place] Registered Dietitian [phone] (pager)

## 2017-09-12 NOTE — Discharge Summary (Signed)
Philip [name] at [place] NAME: Philip [name]    MR#:  [id number]  DATE OF BIRTH:  16-Oct-1960  DATE OF ADMISSION:  08/26/2017 ADMITTING PHYSICIAN: Arta Silence, MD  DATE OF DISCHARGE: 08/30/2017 12:19 PM  PRIMARY CARE PHYSICIAN: Lorelee Market, MD    ADMISSION DIAGNOSIS:  Dehydration [E86.0] Hypokalemia [E87.6] Nausea and vomiting, intractability of vomiting not specified, unspecified vomiting type [R11.2]  DISCHARGE DIAGNOSIS:  Active Problems:   Intractable nausea and vomiting   Protein-calorie malnutrition, severe   SECONDARY DIAGNOSIS:   Past Medical History:  Diagnosis Date  . Anemia   . CAD (coronary artery disease)    a. 08/2014 Inf STEMI/CABG x 3 (LIMA->LAD, VG->Diag, RIMA->RCA).  . DVT, recurrent, lower extremity, acute, left ([place]) 2016  . GIB (gastrointestinal bleeding)    a. In the setting of DAPT-->tolerating plavix only.  . Hyperlipidemia   . Hypertensive heart disease   . Lung cancer ([place])    a. s/p chemo/radiation.  Marland Kitchen PAD (peripheral artery disease) ([place])    a. 03/2015 Periph Angio: short occlusion of L pop w/ evidence of embolization into the DP->5.0x50 mm Inova self-expanding stent;  b. 04/08/2015 ABI: R 1.12, L 0.99; c. 01/2017 LE Duplex: bilat SFA obstructive dzs, patent L pop.  . Tobacco abuse   . 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   He had tried antiemetic medications multiple times in the past and advised by oncologist.   [facility] Radiology help for Dobhoff tube placement, but decided not to place the tube as patient is improving with medical intervention and upgrading the diet.  She was able to tolerate the diet.  * hyponatremia   Hypokalemia   Acute renal insufficiency    This is secondary to decreased  oral intake and dehydration.   IV fluids, consultGI and oncology to help making further plan to avoid this happening again.   Appreciated help, added scheduled antiemetic medicine, Zyprexa, steroid.   Initially the plan was to place a nasojejunal tube, but as patient is improving now and tolerating diet, continue to monitor.  * macrocytosis anemia    This could be secondary to malnutrition   Vitamin supplements, B12 level is normal.  * metastatic lung cancer   Oncology consult to help further management. As per patient he was in remission.   As persistent nausea, suggest to check MRI brain. Negative.  * Recent gastritis   Continue pantoprazole.  * Coronary artery disease   COntinue metoprolol, Plavix.  * Skin lesions    Echymosis and patechial   Platelet are normal   INR is normal.   May be due to vitamin deficiency- start replacing Vit C and thiamine.  * Adrenal insufficiency suspected   Oral prednisone.    His cortisol level was checked after he received 40 mg prednisone dose the previous evening.   I spoke to endocrinologist in the clinic [facility]) Dr. Bing Plume- he suggested, patient's cortisol could Low even with 1 dose of prednisone on the previous day that is reactive. t does not seem like adrenal insufficiency, but he can see the patient in the office if any concern.    DISCHARGE CONDITIONS:   Stable.  CONSULTS OBTAINED:  Treatment Team:  Arta Silence, MD Lin Landsman,  MD Sindy Guadeloupe, MD  DRUG ALLERGIES:   Allergies  Allergen Reactions  . Tylenol [Acetaminophen] Palpitations  . Amitriptyline Other (See Comments)    Burning sensation all over  . Aspirin   . Contrast Media [Iodinated Diagnostic Agents] Other (See Comments)    Pt states that he got "knots behind his ears"  . Nsaids Other (See Comments)    Blood in stools  . Ibuprofen Other (See Comments) and Nausea Only    Blood in stools  . Tape Rash and Other (See Comments)    PLEASE USE  COBAN WRAP; THE PATIENT'S SKIN TEARS WHEN BANDAGES ARE REMOVED!!    DISCHARGE MEDICATIONS:   Allergies as of 08/30/2017      Reactions   Tylenol [acetaminophen] Palpitations   Amitriptyline Other (See Comments)   Burning sensation all over   Aspirin    Contrast Media [iodinated Diagnostic Agents] Other (See Comments)   Pt states that he got "knots behind his ears"   Nsaids Other (See Comments)   Blood in stools   Ibuprofen Other (See Comments), Nausea Only   Blood in stools   Tape Rash, Other (See Comments)   PLEASE USE COBAN WRAP; THE PATIENT'S SKIN TEARS WHEN BANDAGES ARE REMOVED!!      Medication List    STOP taking these medications   benzonatate 100 MG capsule Commonly known as:  TESSALON   buPROPion 150 MG 24 hr tablet Commonly known as:  WELLBUTRIN XL   furosemide 20 MG tablet Commonly known as:  LASIX   gabapentin 300 MG capsule Commonly known as:  NEURONTIN   lisinopril 5 MG tablet Commonly known as:  PRINIVIL,ZESTRIL   mirtazapine 7.5 MG tablet Commonly known as:  REMERON   pantoprazole 40 MG tablet Commonly known as:  PROTONIX   potassium chloride SA 20 MEQ tablet Commonly known as:  K-DUR,KLOR-CON     TAKE these medications   ascorbic acid 500 MG tablet Commonly known as:  VITAMIN C Take 1 tablet (500 mg total) by mouth 2 (two) times daily.   atorvastatin 40 MG tablet Commonly known as:  LIPITOR TAKE 1 TABLET BY MOUTH DAILY   bisacodyl 5 MG EC tablet Commonly known as:  DULCOLAX Take 1 tablet (5 mg total) by mouth daily as needed for moderate constipation.   CALCIUM + D3 PO Take 1 tablet by mouth 3 (three) times daily.   clopidogrel 75 MG tablet Commonly known as:  PLAVIX Take 1 tablet (75 mg total) by mouth daily.   diphenhydrAMINE 25 mg capsule Commonly known as:  BENADRYL Take 25 mg by mouth 2 (two) times daily as needed for allergies.   famotidine 20 MG tablet Commonly known as:  PEPCID Take 1 tablet (20 mg total) by mouth 2  (two) times daily.   feeding supplement (ENSURE ENLIVE) Liqd Take 237 mLs by mouth 3 (three) times daily between meals.   FLOVENT HFA 220 MCG/ACT inhaler Generic drug:  fluticasone INL 2 PFS PO BID   folic acid 1 MG tablet Commonly known as:  FOLVITE Take 1 tablet (1 mg total) by mouth daily.   lidocaine-prilocaine cream Commonly known as:  EMLA Apply to affected area once   magnesium oxide 400 (241.3 Mg) MG tablet Commonly known as:  MAG-OX Take 1 tablet (400 mg total) by mouth 3 (three) times daily.   metoCLOPramide 5 MG tablet Commonly known as:  REGLAN Take 1 tablet (5 mg total) by mouth 4 (four) times daily -  before meals  and at bedtime.   metoprolol tartrate 25 MG tablet Commonly known as:  LOPRESSOR TAKE 1 TABLET BY MOUTH TWICE DAILY   multivitamin with minerals Tabs tablet Take 1 tablet by mouth daily.   OLANZapine zydis 10 MG disintegrating tablet Commonly known as:  ZYPREXA Take 1 tablet (10 mg total) by mouth at bedtime.   ondansetron 8 MG disintegrating tablet Commonly known as:  ZOFRAN-ODT Take 1 tablet (8 mg total) by mouth every 8 (eight) hours as needed for nausea or vomiting.   potassium phosphate (monobasic) 500 MG tablet Commonly known as:  K-PHOS ORIGINAL Take 1 tablet (500 mg total) by mouth 4 (four) times daily -  with meals and at bedtime.   predniSONE 10 MG tablet Commonly known as:  DELTASONE Take 40 mg oral daily for 2 weeks ( until 09/12/17) Then take 30 mg oral daily for next 2 weeks ( 09/13/17- 09/26/17) Then take 20 mg oral daily for next 2 weeks  09/27/17- 10/11/17) Then take 10 mg oral daily for next 2 weeks.   PROAIR HFA 108 (90 Base) MCG/ACT inhaler Generic drug:  albuterol INL 2 PFS PO Q 4 TO 6 H PRN   promethazine 25 MG suppository Commonly known as:  PHENERGAN Place 1 suppository (25 mg total) rectally every 6 (six) hours as needed for nausea or vomiting.   senna-docusate 8.6-50 MG tablet Commonly known as:  Senokot-S Take 1  tablet by mouth at bedtime as needed for mild constipation.   STIOLTO RESPIMAT 2.5-2.5 MCG/ACT Aers Generic drug:  Tiotropium Bromide-Olodaterol INL 2 PFS PO QD   thiamine 100 MG tablet Take 1 tablet (100 mg total) by mouth daily.   traMADol 50 MG tablet Commonly known as:  ULTRAM Take 1 tablet (50 mg total) by mouth every 6 (six) hours as needed.   Vitamin D (Ergocalciferol) 50000 units Caps capsule Commonly known as:  DRISDOL TK ONE C PO Q WEEK        DISCHARGE INSTRUCTIONS:    Follow with Oncology clinic.  If you experience worsening of your admission symptoms, develop shortness of breath, life threatening emergency, suicidal or homicidal thoughts you must seek medical attention immediately by calling 911 or calling your MD immediately  if symptoms less severe.  You Must read complete instructions/literature along with all the possible adverse reactions/side effects for all the Medicines you take and that have been prescribed to you. Take any new Medicines after you have completely understood and accept all the possible adverse reactions/side effects.   Please note  You were cared for by a hospitalist during your hospital stay. If you have any questions about your discharge medications or the care you received while you were in the hospital after you are discharged, you can call the unit and asked to speak with the hospitalist on call if the hospitalist that took care of you is not available. Once you are discharged, your primary care physician will handle any further medical issues. Please note that NO REFILLS for any discharge medications will be authorized once you are discharged, as it is imperative that you return to your primary care physician (or establish a relationship with a primary care physician if you do not have one) for your aftercare needs so that they can reassess your need for medications and monitor your lab values.    Today   CHIEF COMPLAINT:   Chief  Complaint  Patient presents with  . Chest Pain    HISTORY OF PRESENT ILLNESS:  Harrell Gave  Paskett  is a 57 y.o. male with a known history of Stage IV Lung Ca (s/p chemoradiation 2-23mo ago) who p/w AP, intractable N/V. Pt states he was told he is in remission. However, he states that for the past 2-51mo, since the completion of chemoradiation, he has had nausea and vomiting daily, xTNTC. He endorses PO intolerance, vomiting of ingested food and fluids, waxing/waning diffuse crampy abdominal pain worst under the ribs and in the mid-abdomen, weight loss, dehydration, intermittent lightheadeness. He has been unable to keep down his medications. He is thin and chronically-ill appearing. He has  R chest port. He is tachycardic. His mouth is dry. He endorses thirst. He refused a clear liquid diet, and will trial ice chips and sips of water first. Lungs are clear. Abdomen is soft, mildly tender, non-distended, w/ hypoactive bowel sounds. Pt appears cachectic and malnourished. He is in no acute distress at the time of my evaluation. Labwork demonstrates hyponatremia (Na+ 129), hypokalemia (K+ 2.5), Cr elevation/AKI (Cr 1.19, increased from 0.87, ~3wks ago), lactate elevation (2.4, improved to 0.6 w/ IVF), macrocytosis w/o anemia. Admitted for IVF, electrolyte repletion, pain ctrl, symptomatic mgmt.     VITAL SIGNS:  Blood pressure 110/87, pulse 83, temperature 97.6 F (36.4 C), temperature source Oral, resp. rate 18, height 5\' 11"  (1.803 m), weight 47.3 kg (104 lb 4.8 oz), SpO2 99 %.  I/O:  No intake or output data in the 24 hours ending 09/12/17 2232  PHYSICAL EXAMINATION:  GENERAL:  57 y.o.-year-old patient lying in the bed with no acute distress.  EYES: Pupils equal, round, reactive to light and accommodation. No scleral icterus. Extraocular muscles intact.  HEENT: Head atraumatic, normocephalic. Oropharynx and nasopharynx clear.  NECK:  Supple, no jugular venous distention. No thyroid enlargement, no  tenderness.  LUNGS: Normal breath sounds bilaterally, no wheezing, rales,rhonchi or crepitation. No use of accessory muscles of respiration.  CARDIOVASCULAR: S1, S2 normal. No murmurs, rubs, or gallops.  ABDOMEN: Soft, non-tender, non-distended. Bowel sounds present. No organomegaly or mass.  EXTREMITIES: No pedal edema, cyanosis, or clubbing.  NEUROLOGIC: Cranial nerves II through XII are intact. Muscle strength 5/5 in all extremities. Sensation intact. Gait not checked.  PSYCHIATRIC: The patient is alert and oriented x 3.  SKIN: No obvious rash, lesion, or ulcer.   DATA REVIEW:   CBC No results for input(s): WBC, HGB, HCT, PLT in the last 168 hours.  Chemistries  Recent Labs  Lab 09/10/17 0850  NA 129*  K 3.6  CL 99*  CO2 22  GLUCOSE 72  BUN 5*  CREATININE 0.66  CALCIUM 8.5*  MG 1.4*    Cardiac Enzymes No results for input(s): TROPONINI in the last 168 hours.  Microbiology Results  Results for orders placed or performed during the hospital encounter of 08/26/17  MRSA PCR Screening     Status: Abnormal   Collection Time: 08/27/17  8:31 AM  Result Value Ref Range Status   MRSA by PCR POSITIVE (A) NEGATIVE Final    Comment:        The GeneXpert MRSA Assay (FDA approved for NASAL specimens only), is one component of a comprehensive MRSA colonization surveillance program. It is not intended to diagnose MRSA infection nor to guide or monitor treatment for MRSA infections. RESULT CALLED TO, READ BACK BY AND VERIFIED WITH: BERENICE ALEJO CALDERON AT 1003 ON 08/27/17 [place]. Performed at [facility], [address], [place], [place] [address]     RADIOLOGY:  No results found.  EKG:  Orders placed or performed during the hospital encounter of 08/26/17  . ED EKG  . ED EKG  . EKG 12-Lead  . EKG 12-Lead      Management plans discussed with the patient, family and they are in agreement.  CODE STATUS:  Code Status History    Date Active Date Inactive  Code Status Order ID Comments User Context   08/27/2017 0134 08/30/2017 1519 Full Code [id number]  Arta Silence, MD Inpatient   11/15/2016 2101 11/18/2016 1748 Full Code [id number]  Roseanne Kaufman, MD Inpatient   07/26/2016 1455 07/27/2016 2233 Full Code [id number]  Demetrios Loll, MD Inpatient   03/30/2015 1238 03/30/2015 2101 Full Code [id number]  Wellington Hampshire, MD Inpatient   08/25/2014 1309 08/27/2014 1147 Full Code [id number]  Sara Chu Inpatient   08/24/2014 1339 08/24/2014 2042 Full Code [id number]  Isaias Cowman, MD Inpatient      TOTAL TIME TAKING CARE OF THIS PATIENT: 35 minutes.    Vaughan Basta M.D on 09/12/2017 at 10:32 PM  Between 7am to 6pm - Pager - [phone]  After 6pm go to [email or web address] - password [email or web address] [facility]  Office  [phone]  CC: Primary care physician; Lorelee Market, MD   Note: This dictation was prepared with Dragon dictation along with smaller phrase technology. Any transcriptional errors that result from this process are unintentional.

## 2017-09-13 ENCOUNTER — Telehealth: Payer: Self-pay | Admitting: *Deleted

## 2017-09-13 NOTE — Progress Notes (Signed)
Low risk from a cardiac standpoint. Plavix can be held 5 days before procedure and resume the day after as long as no bleeding issues.

## 2017-09-13 NOTE — Telephone Encounter (Signed)
Per Brandi on 09/13/17 to cancel PET scan  PET scan was cancelled as requested. PET will be R/S once It is approved. Patient was made aware of this appt being cancelled. And he was also made aware that once it is approved, we will contact  him with the date and time once it's R/S.

## 2017-09-16 ENCOUNTER — Ambulatory Visit: Admission: RE | Admit: 2017-09-16 | Payer: Medicaid Other | Source: Ambulatory Visit

## 2017-09-16 ENCOUNTER — Inpatient Hospital Stay: Payer: Medicaid Other | Attending: Hematology and Oncology

## 2017-09-16 DIAGNOSIS — E871 Hypo-osmolality and hyponatremia: Secondary | ICD-10-CM | POA: Insufficient documentation

## 2017-09-16 DIAGNOSIS — I2510 Atherosclerotic heart disease of native coronary artery without angina pectoris: Secondary | ICD-10-CM | POA: Insufficient documentation

## 2017-09-16 DIAGNOSIS — D6481 Anemia due to antineoplastic chemotherapy: Secondary | ICD-10-CM | POA: Insufficient documentation

## 2017-09-16 DIAGNOSIS — C3490 Malignant neoplasm of unspecified part of unspecified bronchus or lung: Secondary | ICD-10-CM | POA: Insufficient documentation

## 2017-09-16 DIAGNOSIS — R110 Nausea: Secondary | ICD-10-CM | POA: Insufficient documentation

## 2017-09-16 DIAGNOSIS — E785 Hyperlipidemia, unspecified: Secondary | ICD-10-CM | POA: Insufficient documentation

## 2017-09-16 DIAGNOSIS — I739 Peripheral vascular disease, unspecified: Secondary | ICD-10-CM | POA: Insufficient documentation

## 2017-09-16 DIAGNOSIS — Z86718 Personal history of other venous thrombosis and embolism: Secondary | ICD-10-CM | POA: Insufficient documentation

## 2017-09-16 DIAGNOSIS — T451X5S Adverse effect of antineoplastic and immunosuppressive drugs, sequela: Secondary | ICD-10-CM | POA: Insufficient documentation

## 2017-09-16 DIAGNOSIS — C7951 Secondary malignant neoplasm of bone: Secondary | ICD-10-CM | POA: Insufficient documentation

## 2017-09-16 DIAGNOSIS — R5381 Other malaise: Secondary | ICD-10-CM | POA: Insufficient documentation

## 2017-09-16 DIAGNOSIS — R51 Headache: Secondary | ICD-10-CM | POA: Insufficient documentation

## 2017-09-16 DIAGNOSIS — M25552 Pain in left hip: Secondary | ICD-10-CM | POA: Insufficient documentation

## 2017-09-16 DIAGNOSIS — C771 Secondary and unspecified malignant neoplasm of intrathoracic lymph nodes: Secondary | ICD-10-CM | POA: Insufficient documentation

## 2017-09-16 DIAGNOSIS — Z9221 Personal history of antineoplastic chemotherapy: Secondary | ICD-10-CM | POA: Insufficient documentation

## 2017-09-16 DIAGNOSIS — R634 Abnormal weight loss: Secondary | ICD-10-CM | POA: Insufficient documentation

## 2017-09-16 DIAGNOSIS — R531 Weakness: Secondary | ICD-10-CM | POA: Insufficient documentation

## 2017-09-16 DIAGNOSIS — Z923 Personal history of irradiation: Secondary | ICD-10-CM | POA: Insufficient documentation

## 2017-09-16 DIAGNOSIS — E46 Unspecified protein-calorie malnutrition: Secondary | ICD-10-CM | POA: Insufficient documentation

## 2017-09-16 DIAGNOSIS — Z9181 History of falling: Secondary | ICD-10-CM | POA: Insufficient documentation

## 2017-09-16 DIAGNOSIS — Z79899 Other long term (current) drug therapy: Secondary | ICD-10-CM | POA: Insufficient documentation

## 2017-09-16 DIAGNOSIS — R42 Dizziness and giddiness: Secondary | ICD-10-CM | POA: Insufficient documentation

## 2017-09-16 DIAGNOSIS — R5383 Other fatigue: Secondary | ICD-10-CM | POA: Insufficient documentation

## 2017-09-16 DIAGNOSIS — E2740 Unspecified adrenocortical insufficiency: Secondary | ICD-10-CM | POA: Insufficient documentation

## 2017-09-16 DIAGNOSIS — F17210 Nicotine dependence, cigarettes, uncomplicated: Secondary | ICD-10-CM | POA: Insufficient documentation

## 2017-09-16 DIAGNOSIS — M549 Dorsalgia, unspecified: Secondary | ICD-10-CM | POA: Insufficient documentation

## 2017-09-16 DIAGNOSIS — G8929 Other chronic pain: Secondary | ICD-10-CM | POA: Insufficient documentation

## 2017-09-16 NOTE — Progress Notes (Signed)
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    Patient planning to have PEG tube placed by Dr. Adonys Wildes Norris but unsure when per patient.  Meeting today to discuss feeding tube and tube feeding regimen.    Medications: reviewed  Labs: Last labs on 5/28 Na 129, K 3.6, Mag 1.4, phosphorus 3.7 (WNL) on 5/20  Anthropometrics:   Last recorded weight of 109 lb on 5/29   Estimated Energy Needs  Kcals: 1500-1750 calories/d Protein: 75-88 g/d Fluid: 1.7 L/d  NUTRITION DIAGNOSIS: Malnutrition continues   MALNUTRITION DIAGNOSIS: severe malnutrition continues   INTERVENTION:   Met with patient and wife this am in clinic to show them sample PEG tube.  Discussed how to administer tube feeding via tube.  Discussed PEG care and tube feeding regimen.  Handwritten instructions of all of the above given to patient and wife.  Patient able to return demonstration of how to administer tube feeding and change dressing on sample tube.  Questions were answered.   Once feeding tube is placed recommend starting with 41m of formula (1/4 of carton of osmolite 1.5) 4 times per day only) until refeeding labs can be checked and monitored.  Titration of tube feeding will be determined based of tolerance and refeeding labs.  Patient will flush tube with 655mof water before and after giving tube feeding.   Would recommend goal rate of tube feeding of osmolite 1.5, 4 cartons per day (given at 8am, 12, 4pm, 8pm) and promod 3070mID (additional protein).  At goal rate tube feeding will provide 1620 calories, 80 g of protein and 1564m29m water (water from flush, flush with promod and free water from formula).  Discussed impact of refeeding syndrome and importance of patient returning to clinic for frequent lab checks maybe daily or every 3 days while tube feeding is being titrated.  Patient verbalized understanding.    MONITORING, EVALUATION, GOAL: weight trends, intake, TF tolerance  once tube placed   NEXT VISIT: to be determined.  Patient has contact information as well.    Zhana Jeangilles B. AlleZenia Resides, [place] Dietitian [phone])

## 2017-09-17 ENCOUNTER — Inpatient Hospital Stay: Payer: Medicaid Other

## 2017-09-17 ENCOUNTER — Telehealth: Payer: Self-pay | Admitting: *Deleted

## 2017-09-17 ENCOUNTER — Inpatient Hospital Stay: Payer: Medicaid Other | Admitting: Hematology and Oncology

## 2017-09-17 NOTE — Progress Notes (Deleted)
[facility] day:  09/17/2017   Chief Complaint: Philip [name] is a 57 y.o. male with extensive stage small cell lung cancer who is seen for a 1 week assessment.   HPI:  The patient was last seen in the medical oncology clinic on 09/10/2017.  At that time,  He was s/p a traumatic fall over the weekend. He had multiple areas of abrasions to his RIGHT temple, RIGHT shoulder, and RIGHT knee.  He complained of nausea with solid foods. He was drinking mainly supplement shakes at this point.  Weight was down another 5 pounds. He continued to have chronic pain in his back and legs. Hehad acute pain following a fall over the weekend. Sodium was 129. Potassium was 3.6. Magnesium was 1.4.  He was seen in the ER.  Wounds were cleaned.  He was placed on cephalexin.  He received 1 liter NS and 2 gm magnesium.  At last visit, we discussed follow-up PET scan to ensure no recurrent small cell lung cancer.  He was referred to endocrinology for adernal insufficiency and the pain clinic.  He was to follow-up with nutrition.  A G tube was re-discussed.  He was interested in pursuing.  A follow-up appointment with GI was made.  During the interim,   Past Medical History:  Diagnosis Date  . Anemia   . CAD (coronary artery disease)    a. 08/2014 Inf STEMI/CABG x 3 (LIMA->LAD, VG->Diag, RIMA->RCA).  . DVT, recurrent, lower extremity, acute, left ([place]) 2016  . GIB (gastrointestinal bleeding)    a. In the setting of DAPT-->tolerating plavix only.  . Hyperlipidemia   . Hypertensive heart disease   . Lung cancer ([employer])    a. s/p chemo/radiation.  Marland Kitchen PAD (peripheral artery disease) ([place])    a. 03/2015 Periph Angio: short occlusion of L pop w/ evidence of embolization into the DP->5.0x50 mm Inova self-expanding stent;  b. 04/08/2015 ABI: R 1.12, L 0.99; c. 01/2017 LE Duplex: bilat SFA obstructive dzs, patent L pop.  . Tobacco abuse   . Toe amputation status, left [facility])  07/09/2016   2017    Past Surgical History:  Procedure Laterality Date  . CARDIAC CATHETERIZATION N/A 08/24/2014   Procedure: Left Heart Cath and Coronary Angiography;  Surgeon: Isaias Cowman, MD;  Location: [place] [facility];  Service: Cardiovascular;  Laterality: N/A;  . CARDIAC CATHETERIZATION N/A 08/24/2014   Procedure: Coronary Stent Intervention;  Surgeon: Isaias Cowman, MD;  Location: [place] [facility];  Service: Cardiovascular;  Laterality: N/A;  . CORONARY ARTERY BYPASS GRAFT N/A 08/25/2014   Procedure: CORONARY ARTERY BYPASS GRAFTING (CABG), ON PUMP, TIMES THREE, USING BILATERAL MAMMARY ARTERIES, RIGHT GREATER SAPHENOUS VEIN HARVESTED ENDOSCOPICALLY;  Surgeon: Melrose Nakayama, MD;  Location: [place];  Service: Open Heart Surgery;  Laterality: N/A;  . ESOPHAGOGASTRODUODENOSCOPY (EGD) WITH PROPOFOL N/A 08/08/2017   Procedure: ESOPHAGOGASTRODUODENOSCOPY (EGD) WITH PROPOFOL;  Surgeon: Jonathon Bellows, MD;  Location: [facility];  Service: Gastroenterology;  Laterality: N/A;  . INCISION AND DRAINAGE ABSCESS Left 11/15/2016   Procedure: INCISION AND DRAINAGE ABSCESS OF LEFT WRIST;  Surgeon: Roseanne Kaufman, MD;  Location: [place];  Service: Orthopedics;  Laterality: Left;  . PERIPHERAL VASCULAR CATHETERIZATION N/A 03/30/2015   Procedure: Abdominal Aortogram w/Lower Extremity;  Surgeon: Wellington Hampshire, MD;  Location: [place] [facility];  Service: Cardiovascular;  Laterality: N/A;  . PORTA CATH INSERTION N/A 07/27/2016   Procedure: Glori Luis Cath Insertion;  Surgeon: Algernon Huxley,  MD;  Location: [place] [facility];  Service: Cardiovascular;  Laterality: N/A;  . TEE WITHOUT CARDIOVERSION N/A 08/25/2014   Procedure: TRANSESOPHAGEAL ECHOCARDIOGRAM (TEE);  Surgeon: Melrose Nakayama, MD;  Location: [place];  Service: Open Heart Surgery;  Laterality: N/A;    Family History  Problem Relation Age of Onset  . Hypertension Father   . Lung cancer Father   . Heart attack Mother 69        STENT  . Hyperlipidemia Mother   . Heart attack Brother   . Heart attack Brother   . Breast cancer Sister   . Heart attack Sister   . Colon cancer Paternal Grandfather     Social History:  reports that he has been smoking cigarettes.  He has been smoking about 0.25 packs per day. He has never used smokeless tobacco. He reports that he drinks alcohol. He reports that he does not use drugs.  The patient smokes 1/2 pack of cigarettes/day (previously 2 1/2 ppd).  He started smoking in his teens.  He denies any exposure to radiation or toxins.  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  He lives in [place].  His wife is named Publishing copy.  He is accompanied by his wife today.  Allergies:  Allergies  Allergen Reactions  . Tylenol [Acetaminophen] Palpitations  . Amitriptyline Other (See Comments)    Burning sensation all over  . Aspirin   . Contrast Media [Iodinated Diagnostic Agents] Other (See Comments)    Pt states that he got "knots behind his ears"  . Nsaids Other (See Comments)    Blood in stools  . Ibuprofen Other (See Comments) and Nausea Only    Blood in stools  . Tape Rash and Other (See Comments)    PLEASE USE COBAN WRAP; THE PATIENT'S SKIN TEARS WHEN BANDAGES ARE REMOVED!!    Current Medications: Current Outpatient Medications  Medication Sig Dispense Refill  . atorvastatin (LIPITOR) 40 MG tablet TAKE 1 TABLET BY MOUTH DAILY 90 tablet 3  . bisacodyl (DULCOLAX) 5 MG EC tablet Take 1 tablet (5 mg total) by mouth daily as needed for moderate constipation. 30 tablet 0  . Calcium Carb-Cholecalciferol (CALCIUM + D3 PO) Take 1 tablet by mouth 3 (three) times daily.    . cephALEXin (KEFLEX) 250 MG capsule Take 1 capsule (250 mg total) by mouth 4 (four) times daily for 10 days. 40 capsule 0  . clopidogrel (PLAVIX) 75 MG tablet Take 1 tablet (75 mg total) by mouth daily. 90 tablet 3  . clotrimazole (MYCELEX) 10 MG troche Take 1 lozenge (10 mg total) by mouth 4 (four) times  daily. 35 lozenge 0  . diphenhydrAMINE (BENADRYL) 25 mg capsule Take 25 mg by mouth 2 (two) times daily as needed for allergies.     . famotidine (PEPCID) 20 MG tablet Take 1 tablet (20 mg total) by mouth 2 (two) times daily. 60 tablet 0  . feeding supplement, ENSURE ENLIVE, (ENSURE ENLIVE) LIQD Take 237 mLs by mouth 3 (three) times daily between meals. 237 mL 12  . FLOVENT HFA 220 MCG/ACT inhaler INL 2 PFS PO BID  5  . folic acid (FOLVITE) 1 MG tablet Take 1 tablet (1 mg total) by mouth daily. 30 tablet 0  . lidocaine-prilocaine (EMLA) cream Apply to affected area once 30 g 3  . magnesium oxide (MAG-OX) 400 (241.3 Mg) MG tablet Take 1 tablet (400 mg total) by mouth 3 (three) times daily. 90 tablet 3  .  metoCLOPramide (REGLAN) 5 MG tablet Take 1 tablet (5 mg total) by mouth 4 (four) times daily -  before meals and at bedtime. 100 tablet 0  . metoprolol tartrate (LOPRESSOR) 25 MG tablet TAKE 1 TABLET BY MOUTH TWICE DAILY 180 tablet 3  . Multiple Vitamin (MULTIVITAMIN WITH MINERALS) TABS tablet Take 1 tablet by mouth daily. 30 tablet 1  . OLANZapine zydis (ZYPREXA) 10 MG disintegrating tablet Take 1 tablet (10 mg total) by mouth at bedtime. 30 tablet 0  . ondansetron (ZOFRAN-ODT) 8 MG disintegrating tablet Take 1 tablet (8 mg total) by mouth every 8 (eight) hours as needed for nausea or vomiting. 30 tablet 1  . potassium phosphate, monobasic, (K-PHOS ORIGINAL) 500 MG tablet Take 1 tablet (500 mg total) by mouth 4 (four) times daily -  with meals and at bedtime. 10 tablet 0  . predniSONE (DELTASONE) 10 MG tablet Take 40 mg oral daily for 2 weeks ( until 09/12/17) Then take 30 mg oral daily for next 2 weeks ( 09/13/17- 09/26/17) Then take 20 mg oral daily for next 2 weeks  09/27/17- 10/11/17) Then take 10 mg oral daily for next 2 weeks. 100 tablet 1  . PROAIR HFA 108 (90 Base) MCG/ACT inhaler INL 2 PFS PO Q 4 TO 6 H PRN  5  . promethazine (PHENERGAN) 25 MG suppository Place 1 suppository (25 mg total)  rectally every 6 (six) hours as needed for nausea or vomiting. 12 each 5  . senna-docusate (SENOKOT-S) 8.6-50 MG tablet Take 1 tablet by mouth at bedtime as needed for mild constipation. 30 tablet 0  . STIOLTO RESPIMAT 2.5-2.5 MCG/ACT AERS INL 2 PFS PO QD  5  . thiamine 100 MG tablet Take 1 tablet (100 mg total) by mouth daily. 30 tablet 1  . traMADol (ULTRAM) 50 MG tablet Take 1 tablet (50 mg total) by mouth every 6 (six) hours as needed. (Patient not taking: Reported on 09/10/2017) 40 tablet 0  . vitamin C (VITAMIN C) 500 MG tablet Take 1 tablet (500 mg total) by mouth 2 (two) times daily. 60 tablet 1  . Vitamin D, Ergocalciferol, (DRISDOL) 50000 units CAPS capsule TK ONE C PO Q WEEK  5   No current facility-administered medications for this visit.    Facility-Administered Medications Ordered in Other Visits  Medication Dose Route Frequency Provider Last Rate Last Dose  . heparin lock flush 100 unit/mL  500 Units Intravenous Once Corcoran, Melissa C, MD      . heparin lock flush 100 unit/mL  500 Units Intravenous Once Corcoran, Melissa C, MD      . sodium chloride flush (NS) 0.9 % injection 10 mL  10 mL Intravenous PRN Lequita Asal, MD   10 mL at 09/10/17 0847    Review of Systems  Constitutional: Positive for malaise/fatigue and weight loss (down 5 pounds). Negative for diaphoresis and fever.  HENT: Negative.   Eyes: Negative.   Respiratory: Negative for cough, hemoptysis, sputum production and shortness of breath.   Cardiovascular: Positive for claudication (chronic; followed by cardiology for known PVD; no further planned interventions at this time). Negative for chest pain, palpitations, orthopnea, leg swelling (bilateral ankle edema) and PND.  Gastrointestinal: Positive for diarrhea, nausea and vomiting. Negative for abdominal pain, blood in stool, constipation and melena.  Genitourinary: Positive for frequency. Negative for dysuria, hematuria and urgency.  Musculoskeletal:  Positive for back pain (chronic) and joint pain (LEFT hip; plain films show degenerative changes). Negative for falls and myalgias.  BLE pain related to known PVD  Skin: Negative for itching and rash.       Bruising and areas of compromised skin integrity to RIGHT shoulder and RIGHT knee.   Neurological: Positive for weakness (generalized) and headaches. Negative for dizziness and tremors.  Endo/Heme/Allergies: Does not bruise/bleed easily.  Psychiatric/Behavioral: Negative for depression, memory loss and suicidal ideas. The patient is not nervous/anxious and does not have insomnia.   All other systems reviewed and are negative.  Performance status (ECOG): 2 - Symptomatic, <50% confined to bed  Physical Exam: There were no vitals taken for this visit. GENERAL:  Thin ill appearing gentleman sitting comfortably in the exam room in no acute distress. MENTAL STATUS:  Alert and oriented to person, place and time. HEAD:  Short gray hair.  Normocephalic, atraumatic, face symmetric, no Cushingoid features. EYES:  Brown eyes.  Pupils equal round and reactive to light and accomodation.  No conjunctivitis or scleral icterus. ENT:  Oropharynx clear without lesion.  Tongue normal. Mucous membranes moist.  RESPIRATORY:  Clear to auscultation without rales, wheezes or rhonchi. CARDIOVASCULAR:  Regular rate and rhythm without murmur, rub or gallop. ABDOMEN:  Soft, non-tender, with active bowel sounds, and no hepatosplenomegaly.  No masses. SKIN:  Right side of face s/p trauma with denuded area and exudative material on gauze.  Right shoulder and knee with overlying gauze (removed) with denuded skin.  No rashes, ulcers or lesions. EXTREMITIES: No edema, no skin discoloration or tenderness.  No palpable cords. LYMPH NODES: No palpable cervical, supraclavicular, axillary or inguinal adenopathy  NEUROLOGICAL: Unremarkable. PSYCH:  Appropriate.   GENERAL:  Well developed, well nourished, gentleman sitting  comfortably in the exam room in no acute distress. MENTAL STATUS:  Alert and oriented to person, place and time. HEAD:  *** hair.  Normocephalic, atraumatic, face symmetric, no Cushingoid features. EYES:  *** eyes.  Pupils equal round and reactive to light and accomodation.  No conjunctivitis or scleral icterus. ENT:  Oropharynx clear without lesion.  Tongue normal. Mucous membranes moist.  RESPIRATORY:  Clear to auscultation without rales, wheezes or rhonchi. CARDIOVASCULAR:  Regular rate and rhythm without murmur, rub or gallop. ABDOMEN:  Soft, non-tender, with active bowel sounds, and no hepatosplenomegaly.  No masses. SKIN:  No rashes, ulcers or lesions. EXTREMITIES: No edema, no skin discoloration or tenderness.  No palpable cords. LYMPH NODES: No palpable cervical, supraclavicular, axillary or inguinal adenopathy  NEUROLOGICAL: Unremarkable. PSYCH:  Appropriate.    No visits with results within 3 Day(s) from this visit.  Latest known visit with results is:  Appointment on 09/10/2017  Component Date Value Ref Range Status  . CEA 09/10/2017 2.6  0.0 - 4.7 ng/mL Final   Comment: (NOTE)                             Nonsmokers          <3.9                             Smokers             <5.6 Roche [employer] Electrochemiluminescence Immunoassay (ECLIA) Values obtained with different assay methods or kits cannot be used interchangeably.  Results cannot be interpreted as absolute evidence of the presence or absence of malignant disease. Performed At: [facility] [place], [place] [id number] Rush Farmer MD WU:[id number] Performed at [facility], [phone]  [address], [place], [place] [address]   . Magnesium 09/10/2017 1.4* 1.7 - 2.4 mg/dL Final   Performed at [name], [address] [place], [place] [address]  . Sodium 09/10/2017 129* 135 - 145 mmol/L Final  . Potassium 09/10/2017 3.6  3.5 - 5.1 mmol/L Final  . Chloride 09/10/2017 99* 101  - 111 mmol/L Final  . CO2 09/10/2017 22  22 - 32 mmol/L Final  . Glucose, Bld 09/10/2017 72  65 - 99 mg/dL Final  . BUN 09/10/2017 5* 6 - 20 mg/dL Final  . Creatinine, Ser 09/10/2017 0.66  0.61 - 1.24 mg/dL Final  . Calcium 09/10/2017 8.5* 8.9 - 10.3 mg/dL Final  . GFR calc non Af Amer 09/10/2017 >60  >60 mL/min Final  . GFR calc Af Amer 09/10/2017 >60  >60 mL/min Final   Comment: (NOTE) The eGFR has been calculated using the CKD EPI equation. This calculation has not been validated in all clinical situations. eGFR's persistently <60 mL/min signify possible Chronic Kidney Disease.   Georgiann Hahn gap 09/10/2017 8  5 - 15 Final   Performed at [facility], [address] [place], [place] [address]    Assessment:  Philip [name] is a 57 y.o. male with extensive stage small cell lung cancer.  He presented with a 2 year history of night sweats and a 36 pound weight loss in 6 months.  Right supraclavicular biopsy on 07/19/2016 revealed small cell carcinoma of the lung.  CEA was 1.7 on 07/31/2016.  Chest CT on 06/28/2016 revealed extensive adenopathy.  There were multiple lymph nodes descending thoracic aorta, largest measuring 2.1 x 1.7 cm.  There was adenopathy in the aortopulmonary window with the largest confluence of lymph nodes measuring 4 x 2.6 cm. There was hilar adenopathy on the left measuring 2 x 1.9 cm. There were multiple enlarged lymph nodes in the pretracheal region, largest 1.7 x 1.7 cm.  There were lymph nodes to the left of the carina measuring 2.8 x 1.9 cm. There was a subcarinal lymph node measuring 2.4 x 1.6 cm. There was a 4 x 3 mm nodular opacity in the anterior segment of the left upper lobe.    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  There was nodal metastasis within the chest and supraclavicular/low jugular regions.  There was multifocal osseous metastasis.  There was possibly a pathologic fracture involving  the posteromedial right tenth rib.  There was new left lower lobe opacity with mild hypermetabolism, suspicious for infection or postobstructive pneumonitis.  He completed a course of Levaquin for apost-obstructive pneumonia.  He experienced transient positional left facial burning.  Head MRI on 07/26/2016 revealed no evidence of brain metastasis. There was punctate DWI hyperintensity along a high right frontal gyrus, possible recent infarct.  There was a small occipital bone metastasis.  Head MRA on 07/27/2016 revealed normal large and medium sized vessels.  Bilateral carotid duplex on 07/27/2016 revealed <50% stenosis in the right and left internal carotis arteries.  Echo on 07/27/2016 revealed an EF of 45-50% without cardiac source of emboli.  He received 4 cycles of carboplatin and etoposide (07/31/2016 - 10/02/2016) with OnPro Neulasta support.  Platelet nadir was 40,000 on day 16 of cycle #2 and 23,000 on day 15 of cycle #3.  He received 2 units of PRBC with cycle #3 and 1 unit with cycle #4.  He received Xgeva on 08/13/2016 (last 10/30/2016).  Chest CT on 09/11/2016 revealed a  marked response to therapy.  There was near complete resolution of left suprahilar mass and thoracic adenopathy.  There was interval sclerosis indicative of healing site of osseous metastasis.  Thoracic spine MRI on 10/22/2016 reveals sclerotic enhancing lesion at T7 vertebral body on the left compatible with metastasis. There was a healing fracture the right medial 10th rib.  PET scan on 11/12/2016 revealed a new 10 mm hypermetabolic left axillary lymph node (SUV 3.6) due to a left hand abscess.  There were no other sites of metabolically active disease.  He completed a course of thoracic spine radiation (3000 cGy in 10 fractions) on 01/11/2017.  He received PCI (3000 cGy in 15 fractions) from 01/28/2017 - 02/15/2017.  Chest, abdomen, and pelvic CT on 03/18/2017 revealed new areas of irregular ground-glass in the medial  aspects of the right upper and right lower lobes as well as somewhat amorphous ground-glass in the lingula and left lower lobe. Time interval favored infectious or inflammatory lesions.  Osseous metastatic disease was stable.  There was resolved or resected left axillary lymph node.  Bone scan on 04/10/2017 revealed multiple foci of skeletal metastasis within the ribs, pelvis, and RIGHT clavicle.  Multiple areas of activity corresponding to sclerotic lesions on CT and non-hypermetabolic lesions on PET scan.  PET scan on 04/26/2017 revealed bandlike densities medially in the right upper lobe and medially in the right lower lobe were new from 11/12/2016 but were present on 03/18/2017, and had faint hypermetabolic activity. Radiation pneumonitis could have this appearance.  The morphology would be unusual for malignancy but surveillance was warranted.  There were scattered stable sclerotic bony lesions that were not hypermetabolic.  There was persistent accentuated activity in the vicinity of the anus may be physiologic but was technically nonspecific.  LEFT hip radiograph on 09/02/2017 revealed degenerative changes in the bilateral hip joints, with the left being more significant than the right. There was no mention of new osseous lytic lesions. She  He has chemotherapy induced anemia.  Anemia work-up on 09/11/2016 revealed the following normal labs:  Ferritin (219), iron sat (28%), TIBC (227), B12 (3055), and folate (6.8).  He has folate deficiency.  Folate was 5.9 on 08/27/2017.  B12 was 542 on 08/27/2017.  He has a history of left lower extremity DVT a few months after his cardiac surgery in 08/2014.  He has peripheral vascular disease s/p stent placement in 03/2015.  He is on a Plavix.  He has not had a colonoscopy in 30 years.  He was ineligible for the AbbVie clinical trial (M16-298) secondary to his left hand abscess.  He underwent surgical debridement of a left hand abscess on 11/15/2016.  Wound  cultures grew MRSA.  He was treated with emperic IV Vancomycin then Bactrim.   He was admitted to [facility] from 08/26/2017 - 08/30/2017 with nausea, vomiting, dehydration and electrolyte abnormalities.  Head MRI without contrast on 08/29/2017 revealed no evidence of metastatic disease. Prior UGI revealed no evidence of obstruction or gastroparesis.  Labs revealed no evidence of pancreatitis.  He was started on scheduled antiemetics and PPI BID.  AM cortisol on 08/28/2017 was low.  He was diagnosed with adrenal insufficiency.  He was started on prednisone 40 mg a day.  He was able to advance his diet.  Tube feeds were avoided.  Symptomatically, patient is s/p a traumatic fall over the weekend. He has multiple areas of abrasions to his RIGHT temple, RIGHT shoulder, and RIGHT knee. He complains on nausea with solid foods. He  is drinking mainly supplement shakes at this point.  Weight is down another 5 pounds. He continues to have chronic pain in his back and legs. He now has acute pain following a fall over the weekend. Sodium is 129. Potassium 3.6. Magnesium is 1.4.   Plan: 1. Labs today: BMP, Mg, Phos.   2. Review diagnostic plain films of LEFT hip - degenerative changes, with no new osseous lytic lesions.  3. Discuss status of referrals:  Endocrinology ([place]) [facility] has received the referral for patient to been seen to discuss adrenal insufficiency. No appointment has been scheduled at this point. Patient continues on previously prescribed steroid dose.   Pain management - patient has been referred to several pain management clinics. Refused to see Dr. Dossie Arbour. There are open referrals Dr. Nathanial Rancher (made by this clinic) and to Dr. Gillis Santa (made by cardiology). Patient encouraged to call both offices to follow up on the status of appointments.    4. Discuss protein calorie malnutrition. Weight down another 5 pounds.  Patient encouraged to increase intake of protein and calorie dense  foods.  He was advised to take the oral supplements as recommended by cancer center RD. Additionally, he was encouraged to continue Ensure shakes to supplement his nutrition at least 2 - 3 times daily. patient to follow-up with RD on 09/12/2017. Discuss PEG tube placement, which he is now amenable to. Will discuss with Dr. Allen Norris. 5. Discuss HYPOnatremia and HYPOmagnesemia. Patient will need IVFs and supplemental magnesium replacement today.  6. Discuss need for PET imaging to assess for active malignancy given ongoing weight loss issues and pain.  7. Discuss need for evaluation in the ED. Patient is weak. His Mg and Na are low. He has multiple areas of trauma related to recent fall. He requires further evaluation with imaging and treatment.  8. Discuss safety. Patient falling at home due to generalized weakness. He is requesting a wheelchair and handicap placard. Will oblige request for both.  9. RTC in 1 week for MD assessment, labs (BMP, Mg, Phos), and +/- IV Mg.   Lequita Asal, MD  09/17/2017, 4:34 AM   I saw and evaluated the patient, participating in the key portions of the service and reviewing pertinent diagnostic studies and records.  I reviewed the nurse practitioner's note and agree with the findings and the plan.  The assessment and plan were discussed with the patient.  Additional diagnostic studies of PET are needed to clarify disease status and would change the clinical management.  Multiple questions were asked by the patient and answered.   Nolon Stalls, MD 09/17/2017,4:34 AM

## 2017-09-17 NOTE — Telephone Encounter (Signed)
Patient called and reports that he is unable to come in today because his car will not start. He wants to reschedule appointment. Call was transferred to scheduler

## 2017-09-17 NOTE — Telephone Encounter (Signed)
Patient Called @ 9:40 to Cancel his lab/MD/Infusion appts for today.  He stated that he was having car trouble.And said he would call back to R/S his appts.

## 2017-09-19 ENCOUNTER — Ambulatory Visit: Admission: RE | Admit: 2017-09-19 | Payer: Medicaid Other | Source: Ambulatory Visit | Admitting: Gastroenterology

## 2017-09-19 ENCOUNTER — Encounter: Admission: RE | Payer: Self-pay | Source: Ambulatory Visit

## 2017-09-19 SURGERY — INSERTION, PEG TUBE
Anesthesia: Choice

## 2017-09-23 ENCOUNTER — Encounter: Payer: Self-pay | Admitting: Hematology and Oncology

## 2017-09-23 ENCOUNTER — Inpatient Hospital Stay: Payer: Medicaid Other

## 2017-09-23 ENCOUNTER — Telehealth: Payer: Self-pay | Admitting: *Deleted

## 2017-09-23 ENCOUNTER — Other Ambulatory Visit: Payer: Self-pay | Admitting: Hematology and Oncology

## 2017-09-23 ENCOUNTER — Other Ambulatory Visit: Payer: Self-pay

## 2017-09-23 ENCOUNTER — Inpatient Hospital Stay (HOSPITAL_BASED_OUTPATIENT_CLINIC_OR_DEPARTMENT_OTHER): Payer: Medicaid Other | Admitting: Hematology and Oncology

## 2017-09-23 VITALS — BP 130/86 | HR 73 | Temp 97.0°F | Resp 20 | Ht 71.0 in | Wt 112.0 lb

## 2017-09-23 DIAGNOSIS — C3490 Malignant neoplasm of unspecified part of unspecified bronchus or lung: Secondary | ICD-10-CM

## 2017-09-23 DIAGNOSIS — E43 Unspecified severe protein-calorie malnutrition: Secondary | ICD-10-CM

## 2017-09-23 DIAGNOSIS — C771 Secondary and unspecified malignant neoplasm of intrathoracic lymph nodes: Secondary | ICD-10-CM | POA: Diagnosis not present

## 2017-09-23 DIAGNOSIS — R531 Weakness: Secondary | ICD-10-CM | POA: Diagnosis not present

## 2017-09-23 DIAGNOSIS — T451X5S Adverse effect of antineoplastic and immunosuppressive drugs, sequela: Secondary | ICD-10-CM | POA: Diagnosis not present

## 2017-09-23 DIAGNOSIS — R5383 Other fatigue: Secondary | ICD-10-CM

## 2017-09-23 DIAGNOSIS — I2510 Atherosclerotic heart disease of native coronary artery without angina pectoris: Secondary | ICD-10-CM | POA: Diagnosis not present

## 2017-09-23 DIAGNOSIS — Z7189 Other specified counseling: Secondary | ICD-10-CM

## 2017-09-23 DIAGNOSIS — Z923 Personal history of irradiation: Secondary | ICD-10-CM | POA: Diagnosis not present

## 2017-09-23 DIAGNOSIS — E46 Unspecified protein-calorie malnutrition: Secondary | ICD-10-CM | POA: Diagnosis not present

## 2017-09-23 DIAGNOSIS — E2740 Unspecified adrenocortical insufficiency: Secondary | ICD-10-CM | POA: Diagnosis not present

## 2017-09-23 DIAGNOSIS — E871 Hypo-osmolality and hyponatremia: Secondary | ICD-10-CM | POA: Diagnosis not present

## 2017-09-23 DIAGNOSIS — E876 Hypokalemia: Secondary | ICD-10-CM

## 2017-09-23 DIAGNOSIS — R634 Abnormal weight loss: Secondary | ICD-10-CM

## 2017-09-23 DIAGNOSIS — Z9221 Personal history of antineoplastic chemotherapy: Secondary | ICD-10-CM | POA: Diagnosis not present

## 2017-09-23 DIAGNOSIS — Z79899 Other long term (current) drug therapy: Secondary | ICD-10-CM

## 2017-09-23 DIAGNOSIS — I739 Peripheral vascular disease, unspecified: Secondary | ICD-10-CM | POA: Diagnosis not present

## 2017-09-23 DIAGNOSIS — R42 Dizziness and giddiness: Secondary | ICD-10-CM | POA: Diagnosis not present

## 2017-09-23 DIAGNOSIS — D6481 Anemia due to antineoplastic chemotherapy: Secondary | ICD-10-CM | POA: Diagnosis not present

## 2017-09-23 DIAGNOSIS — M546 Pain in thoracic spine: Secondary | ICD-10-CM

## 2017-09-23 DIAGNOSIS — G8929 Other chronic pain: Secondary | ICD-10-CM

## 2017-09-23 DIAGNOSIS — C3412 Malignant neoplasm of upper lobe, left bronchus or lung: Secondary | ICD-10-CM

## 2017-09-23 DIAGNOSIS — R5381 Other malaise: Secondary | ICD-10-CM

## 2017-09-23 DIAGNOSIS — M25552 Pain in left hip: Secondary | ICD-10-CM | POA: Diagnosis not present

## 2017-09-23 DIAGNOSIS — C7951 Secondary malignant neoplasm of bone: Secondary | ICD-10-CM | POA: Diagnosis not present

## 2017-09-23 DIAGNOSIS — F17210 Nicotine dependence, cigarettes, uncomplicated: Secondary | ICD-10-CM

## 2017-09-23 DIAGNOSIS — Z86718 Personal history of other venous thrombosis and embolism: Secondary | ICD-10-CM

## 2017-09-23 DIAGNOSIS — Z9181 History of falling: Secondary | ICD-10-CM

## 2017-09-23 DIAGNOSIS — R51 Headache: Secondary | ICD-10-CM

## 2017-09-23 DIAGNOSIS — M549 Dorsalgia, unspecified: Secondary | ICD-10-CM

## 2017-09-23 DIAGNOSIS — E785 Hyperlipidemia, unspecified: Secondary | ICD-10-CM

## 2017-09-23 DIAGNOSIS — R110 Nausea: Secondary | ICD-10-CM | POA: Diagnosis not present

## 2017-09-23 DIAGNOSIS — Z931 Gastrostomy status: Secondary | ICD-10-CM

## 2017-09-23 DIAGNOSIS — R112 Nausea with vomiting, unspecified: Secondary | ICD-10-CM

## 2017-09-23 LAB — COMPREHENSIVE METABOLIC PANEL
ALT: 27 U/L (ref 17–63)
AST: 27 U/L (ref 15–41)
Albumin: 2.3 g/dL — ABNORMAL LOW (ref 3.5–5.0)
Alkaline Phosphatase: 58 U/L (ref 38–126)
Anion gap: 5 (ref 5–15)
BUN: 6 mg/dL (ref 6–20)
CO2: 25 mmol/L (ref 22–32)
Calcium: 8.2 mg/dL — ABNORMAL LOW (ref 8.9–10.3)
Chloride: 96 mmol/L — ABNORMAL LOW (ref 101–111)
Creatinine, Ser: 0.57 mg/dL — ABNORMAL LOW (ref 0.61–1.24)
GFR calc Af Amer: >60 mL/min (ref >60)
GFR calc non Af Amer: >60 mL/min (ref >60)
Glucose, Bld: 84 mg/dL (ref 65–99)
Potassium: 3.7 mmol/L (ref 3.5–5.1)
Sodium: 126 mmol/L — ABNORMAL LOW (ref 135–145)
Total Bilirubin: 0.6 mg/dL (ref 0.3–1.2)
Total Protein: 4.5 g/dL — ABNORMAL LOW (ref 6.5–8.1)

## 2017-09-23 LAB — CBC WITH DIFFERENTIAL/PLATELET
Basophils Absolute: 0 10*3/uL (ref 0–0.1)
Basophils Relative: 0 %
Eosinophils Absolute: 0 10*3/uL (ref 0–0.7)
Eosinophils Relative: 1 %
HCT: 30.2 % — ABNORMAL LOW (ref 40.0–52.0)
Hemoglobin: 11.1 g/dL — ABNORMAL LOW (ref 13.0–18.0)
Lymphocytes Relative: 11 %
Lymphs Abs: 0.6 10*3/uL — ABNORMAL LOW (ref 1.0–3.6)
MCH: 38.3 pg — ABNORMAL HIGH (ref 26.0–34.0)
MCHC: 36.9 g/dL — ABNORMAL HIGH (ref 32.0–36.0)
MCV: 103.9 fL — ABNORMAL HIGH (ref 80.0–100.0)
Monocytes Absolute: 0.3 10*3/uL (ref 0.2–1.0)
Monocytes Relative: 6 %
Neutro Abs: 4.7 10*3/uL (ref 1.4–6.5)
Neutrophils Relative %: 82 %
Platelets: 189 10*3/uL (ref 150–440)
RBC: 2.91 MIL/uL — ABNORMAL LOW (ref 4.40–5.90)
RDW: 14.4 % (ref 11.5–14.5)
WBC: 5.6 10*3/uL (ref 3.8–10.6)

## 2017-09-23 LAB — MAGNESIUM: Magnesium: 1.3 mg/dL — ABNORMAL LOW (ref 1.7–2.4)

## 2017-09-23 LAB — PHOSPHORUS: Phosphorus: 3 mg/dL (ref 2.5–4.6)

## 2017-09-23 MED ORDER — PREDNISONE 10 MG PO TABS: ORAL_TABLET | ORAL | 1 refills | Status: DC

## 2017-09-23 MED ORDER — OXYCODONE HCL 5 MG PO TABS: 5.0000 mg | ORAL_TABLET | Freq: Four times a day (QID) | ORAL | 0 refills | Status: DC | PRN

## 2017-09-23 MED ORDER — SODIUM CHLORIDE 0.9% FLUSH
10.0000 mL | Freq: Once | INTRAVENOUS | Status: AC
  Administered 2017-09-23: 10 mL via INTRAVENOUS
  Filled 2017-09-23: qty 10

## 2017-09-23 MED ORDER — SODIUM CHLORIDE 0.9 % IV SOLN: Freq: Once | INTRAVENOUS | Status: DC

## 2017-09-23 MED ORDER — HEPARIN SOD (PORK) LOCK FLUSH 100 UNIT/ML IV SOLN
500.0000 [IU] | Freq: Once | INTRAVENOUS | Status: AC
  Administered 2017-09-23: 500 [IU] via INTRAVENOUS
  Filled 2017-09-23: qty 5

## 2017-09-23 NOTE — Telephone Encounter (Signed)
Per Nira Conn 09/23/17 scheduler message to schedule patient for IV Mg on 09/24/17.  Appt. was scheduled as requested. I have him scheduled for IV Mg @ 10:00a.m a message was left on his vmail making him aware of date and time of this appt for tomorrow.

## 2017-09-23 NOTE — Telephone Encounter (Signed)
Patient has not returned phone call as of 1432 - will send msg to schedule to see if patient can come tomorrow for iv mag.

## 2017-09-23 NOTE — Progress Notes (Signed)
Nutrition  RD was planning on meeting with patient during infusion but infusion cancelled per infusion RN for today.    Philip [name] B. Zenia Resides, [place], [place] Registered Dietitian [phone] (pager)

## 2017-09-23 NOTE — Patient Instructions (Addendum)
PEG tube (feeding tube) placement is scheduled for 10/01/2017. You will need to call [facility] on Monday to find out your arrival time.

## 2017-09-23 NOTE — Telephone Encounter (Signed)
Per v/o Honor Loh, NP - contacted patient to set up IV magnesium. Pt's mag level is 1.3.  Left vm to see if he could come back today or another day this week for IV mag.

## 2017-09-23 NOTE — Progress Notes (Signed)
Philip [name] day:  09/23/2017   Chief Complaint: Philip [name] is a 57 y.o. male with extensive stage small cell lung cancer who is seen for a 2 week assessment.   HPI:  The patient was last seen in the medical oncology [name] on 09/10/2017.  At that time,  He was s/p a traumatic fall over the weekend. He had multiple areas of abrasions to his RIGHT temple, RIGHT shoulder, and RIGHT knee.  He complained of nausea with solid foods. He was drinking mainly supplement shakes at this point.  Weight was down another 5 pounds. He continued to have chronic pain in his back and legs. He had acute pain following a fall over the weekend. Sodium was 129. Potassium was 3.6. Magnesium was 1.4.  He was seen in the ER.  Wounds were cleaned.  He was placed on cephalexin.  Head CT without contrast revealed no acute abnormality.  Shoulder films were negative.  He received 1 liter NS and 2 gm magnesium.  At last visit, we discussed follow-up PET scan to ensure no recurrent small cell lung cancer.  He was referred to endocrinology for adrenal insufficiency and the pain [name].  He was to follow-up with nutrition.  A G tube was re-discussed.  He was interested in pursuing.  A follow-up appointment with GI was made.  During the interim, patient is doing well. Patient is "forcing himself to eat". He notes that he will "take a bite and then throw it up".  Weight has increased 3 pounds. Diarrhea has resolved. When patient was in the hospital, he feels that he ate better because his pain was better controlled. Patient complains of chronic pain in his lower back, hips, knees, and feet.  He states, "this pain is getting ridiculous". Pain rated 7/10 in [name] today.   Patients denies fevers and sweats. He has not experienced any recent infections.   Past Medical History:  Diagnosis Date  . Anemia   . CAD (coronary artery disease)    a. 08/2014 Inf STEMI/CABG x 3 (LIMA->LAD,  VG->Diag, RIMA->RCA).  . DVT, recurrent, lower extremity, acute, left (Philip [name]) 2016  . GIB (gastrointestinal bleeding)    a. In the setting of DAPT-->tolerating plavix only.  . Hyperlipidemia   . Hypertensive heart disease   . Lung cancer (Philip [name])    a. s/p chemo/radiation.  Marland Kitchen PAD (peripheral artery disease) (Philip [name])    a. 03/2015 Periph Angio: short occlusion of L pop w/ evidence of embolization into the DP->5.0x50 mm Inova self-expanding stent;  b. 04/08/2015 ABI: R 1.12, L 0.99; c. 01/2017 LE Duplex: bilat SFA obstructive dzs, patent L pop.  . Tobacco abuse   . Toe amputation status, left Philip [name]) 07/09/2016   2017    Past Surgical History:  Procedure Laterality Date  . CARDIAC CATHETERIZATION N/A 08/24/2014   Procedure: Left Heart Cath and Coronary Angiography;  Surgeon: Philip Cowman, MD;  Location: [place] [facility];  Service: Cardiovascular;  Laterality: N/A;  . CARDIAC CATHETERIZATION N/A 08/24/2014   Procedure: Coronary Stent Intervention;  Surgeon: Philip Cowman, MD;  Location: [place] [facility];  Service: Cardiovascular;  Laterality: N/A;  . CORONARY ARTERY BYPASS GRAFT N/A 08/25/2014   Procedure: CORONARY ARTERY BYPASS GRAFTING (CABG), ON PUMP, TIMES THREE, USING BILATERAL MAMMARY ARTERIES, RIGHT GREATER SAPHENOUS VEIN HARVESTED ENDOSCOPICALLY;  Surgeon: Philip Nakayama, MD;  Location: [facility];  Service: Open Heart Surgery;  Laterality: N/A;  . ESOPHAGOGASTRODUODENOSCOPY (EGD) WITH PROPOFOL N/A  08/08/2017   Procedure: ESOPHAGOGASTRODUODENOSCOPY (EGD) WITH PROPOFOL;  Surgeon: Philip Bellows, MD;  Location: [facility];  Service: Gastroenterology;  Laterality: N/A;  . INCISION AND DRAINAGE ABSCESS Left 11/15/2016   Procedure: INCISION AND DRAINAGE ABSCESS OF LEFT WRIST;  Surgeon: Philip Kaufman, MD;  Location: [facility];  Service: Orthopedics;  Laterality: Left;  . PERIPHERAL VASCULAR CATHETERIZATION N/A 03/30/2015   Procedure: Abdominal Aortogram w/Lower Extremity;  Surgeon:  Philip Hampshire, MD;  Location: [place] [facility];  Service: Cardiovascular;  Laterality: N/A;  . PORTA CATH INSERTION N/A 07/27/2016   Procedure: Philip [name] Cath Insertion;  Surgeon: Philip Huxley, MD;  Location: [place] [facility];  Service: Cardiovascular;  Laterality: N/A;  . TEE WITHOUT CARDIOVERSION N/A 08/25/2014   Procedure: TRANSESOPHAGEAL ECHOCARDIOGRAM (TEE);  Surgeon: Philip Nakayama, MD;  Location: [place];  Service: Open Heart Surgery;  Laterality: N/A;    Family History  Problem Relation Age of Onset  . Hypertension Father   . Lung cancer Father   . Heart attack Mother 73       STENT  . Hyperlipidemia Mother   . Heart attack Brother   . Heart attack Brother   . Breast cancer Sister   . Heart attack Sister   . Colon cancer Paternal Grandfather     Social History:  reports that he has been smoking cigarettes.  He has been smoking about 0.25 packs per day. He has never used smokeless tobacco. He reports that he drinks alcohol. He reports that he does not use drugs.  The patient smokes 1/2 pack of cigarettes/day (previously 2 1/2 ppd).  He started smoking in his teens.  He denies any exposure to radiation or toxins.  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  He lives in [place].  His wife is named Philip [name].  He is accompanied by his wife today.  Allergies:  Allergies  Allergen Reactions  . Tylenol [Acetaminophen] Palpitations  . Amitriptyline Other (See Comments)    Burning sensation all over  . Aspirin   . Contrast Media [Iodinated Diagnostic Agents] Other (See Comments)    Pt states that he got "knots behind his ears"  . Nsaids Other (See Comments)    Blood in stools  . Ibuprofen Other (See Comments) and Nausea Only    Blood in stools  . Tape Rash and Other (See Comments)    PLEASE USE COBAN WRAP; THE PATIENT'S SKIN TEARS WHEN BANDAGES ARE REMOVED!!    Current Medications: Current Outpatient Medications  Medication Sig Dispense Refill  .  atorvastatin (LIPITOR) 40 MG tablet TAKE 1 TABLET BY MOUTH DAILY 90 tablet 3  . bisacodyl (DULCOLAX) 5 MG EC tablet Take 1 tablet (5 mg total) by mouth daily as needed for moderate constipation. 30 tablet 0  . Calcium Carb-Cholecalciferol (CALCIUM + D3 PO) Take 1 tablet by mouth 3 (three) times daily.    . clopidogrel (PLAVIX) 75 MG tablet Take 1 tablet (75 mg total) by mouth daily. 90 tablet 3  . clotrimazole (MYCELEX) 10 MG troche Take 1 lozenge (10 mg total) by mouth 4 (four) times daily. 35 lozenge 0  . diphenhydrAMINE (BENADRYL) 25 mg capsule Take 25 mg by mouth 2 (two) times daily as needed for allergies.     . famotidine (PEPCID) 20 MG tablet Take 1 tablet (20 mg total) by mouth 2 (two) times daily. 60 tablet 0  . feeding supplement, ENSURE ENLIVE, (ENSURE ENLIVE) LIQD Take 237 mLs by mouth  3 (three) times daily between meals. (Patient taking differently: Take 237 mLs by mouth 2 (two) times daily between meals. ) 237 mL 12  . FLOVENT HFA 220 MCG/ACT inhaler INL 2 PFS PO BID  5  . fluconazole (DIFLUCAN) 100 MG tablet Take 1 tablet by mouth daily.  3  . folic acid (FOLVITE) 1 MG tablet Take 1 tablet (1 mg total) by mouth daily. 30 tablet 0  . hydrochlorothiazide (HYDRODIURIL) 12.5 MG tablet Take 1 tablet by mouth daily.  2  . lidocaine-prilocaine (EMLA) cream Apply to affected area once 30 g 3  . magnesium oxide (MAG-OX) 400 (241.3 Mg) MG tablet Take 1 tablet (400 mg total) by mouth 3 (three) times daily. 90 tablet 3  . metoCLOPramide (REGLAN) 5 MG tablet Take 1 tablet (5 mg total) by mouth 4 (four) times daily -  before meals and at bedtime. 100 tablet 0  . metoprolol tartrate (LOPRESSOR) 25 MG tablet TAKE 1 TABLET BY MOUTH TWICE DAILY 180 tablet 3  . Multiple Vitamin (MULTIVITAMIN WITH MINERALS) TABS tablet Take 1 tablet by mouth daily. 30 tablet 1  . OLANZapine zydis (ZYPREXA) 10 MG disintegrating tablet Take 1 tablet (10 mg total) by mouth at bedtime. 30 tablet 0  . ondansetron  (ZOFRAN-ODT) 8 MG disintegrating tablet Take 1 tablet (8 mg total) by mouth every 8 (eight) hours as needed for nausea or vomiting. 30 tablet 1  . potassium phosphate, monobasic, (K-PHOS ORIGINAL) 500 MG tablet Take 1 tablet (500 mg total) by mouth 4 (four) times daily -  with meals and at bedtime. 10 tablet 0  . predniSONE (DELTASONE) 10 MG tablet Take 40 mg oral daily for 2 weeks ( until 09/12/17) Then take 30 mg oral daily for next 2 weeks ( 09/13/17- 09/26/17) Then take 20 mg oral daily for next 2 weeks  09/27/17- 10/11/17) Then take 10 mg oral daily for next 2 weeks. 50 tablet 1  . PROAIR HFA 108 (90 Base) MCG/ACT inhaler INL 2 PFS PO Q 4 TO 6 H PRN  5  . promethazine (PHENERGAN) 25 MG suppository Place 1 suppository (25 mg total) rectally every 6 (six) hours as needed for nausea or vomiting. 12 each 5  . senna-docusate (SENOKOT-S) 8.6-50 MG tablet Take 1 tablet by mouth at bedtime as needed for mild constipation. 30 tablet 0  . STIOLTO RESPIMAT 2.5-2.5 MCG/ACT AERS INL 2 PFS PO QD  5  . thiamine 100 MG tablet Take 1 tablet (100 mg total) by mouth daily. 30 tablet 1  . vitamin C (VITAMIN C) 500 MG tablet Take 1 tablet (500 mg total) by mouth 2 (two) times daily. 60 tablet 1  . Vitamin D, Ergocalciferol, (DRISDOL) 50000 units CAPS capsule TK ONE C PO Q WEEK  5   No current facility-administered medications for this visit.    Facility-Administered Medications Ordered in Other Visits  Medication Dose Route Frequency Provider Last Rate Last Dose  . heparin lock flush 100 unit/mL  500 Units Intravenous Once Corcoran, Melissa C, MD      . heparin lock flush 100 unit/mL  500 Units Intravenous Once Corcoran, Melissa C, MD      . heparin lock flush 100 unit/mL  500 Units Intravenous Once Corcoran, Melissa C, MD      . sodium chloride flush (NS) 0.9 % injection 10 mL  10 mL Intravenous PRN Lequita Asal, MD   10 mL at 09/10/17 0847  . sodium chloride flush (NS) 0.9 % injection  10 mL  10 mL Intravenous  Once Lequita Asal, MD        Review of Systems  Constitutional: Positive for malaise/fatigue. Negative for diaphoresis, fever and weight loss (up 3 pounds).  HENT: Negative.  Negative for congestion, nosebleeds, sinus pain and sore throat.   Eyes: Negative.  Negative for double vision, photophobia, pain and discharge.  Respiratory: Negative for cough, hemoptysis, sputum production and shortness of breath.   Cardiovascular: Positive for claudication (chronic; followed by cardiology for known PVD; no further planned interventions at this time). Negative for chest pain, palpitations, orthopnea, leg swelling (bilateral ankle edema) and PND.  Gastrointestinal: Positive for nausea and vomiting. Negative for abdominal pain, blood in stool, constipation, diarrhea and melena.  Genitourinary: Negative for dysuria, frequency, hematuria and urgency.  Musculoskeletal: Positive for back pain (chronic) and joint pain (LEFT hip; plain films show degenerative changes). Negative for falls and myalgias.       BLE pain related to known PVD  Skin: Negative for itching and rash.       Healing areas of abrasion to RIGHT shoulder and RIGHT knee.   Neurological: Positive for weakness (generalized) and headaches. Negative for dizziness and tremors.  Endo/Heme/Allergies: Does not bruise/bleed easily.  Psychiatric/Behavioral: Negative for depression, memory loss, substance abuse and suicidal ideas. The patient is not nervous/anxious and does not have insomnia.   All other systems reviewed and are negative.  Performance status (ECOG): 2 - Symptomatic, <50% confined to bed  Physical Exam: Blood pressure 130/86, pulse 73, temperature (!) 97 F (36.1 C), temperature source Tympanic, resp. rate 20, height '5\' 11"'  (1.803 m), weight 112 lb (50.8 kg). GENERAL:  Thin gentleman sitting comfortably in the exam room in no acute distress. MENTAL STATUS:  Alert and oriented to person, place and time. HEAD:  Short gray hair.   Temporal wasting.  Normocephalic, atraumatic, face symmetric, no Cushingoid features. EYES:  Brown eyes.  Pupils equal round and reactive to light and accomodation.  No conjunctivitis or scleral icterus. ENT:  Oropharynx clear without lesion.  Tongue normal. Mucous membranes moist.  RESPIRATORY:  Clear to auscultation without rales, wheezes or rhonchi. CARDIOVASCULAR:  Regular rate and rhythm without murmur, rub or gallop. ABDOMEN:  Scaphoid.  Soft, non-tender, with active bowel sounds, and no hepatosplenomegaly.  No masses. SKIN:  Well healing areas of right facial trauma without evidence of infection.  Right shoulder and knee eschar.  No rashes, ulcers or lesions. EXTREMITIES: No edema, no skin discoloration or tenderness.  No palpable cords. LYMPH NODES: No palpable cervical, supraclavicular, axillary or inguinal adenopathy  NEUROLOGICAL: Unremarkable. PSYCH:  Appropriate.    Infusion on 09/23/2017  Component Date Value Ref Range Status  . Sodium 09/23/2017 126* 135 - 145 mmol/L Final  . Potassium 09/23/2017 3.7  3.5 - 5.1 mmol/L Final  . Chloride 09/23/2017 96* 101 - 111 mmol/L Final  . CO2 09/23/2017 25  22 - 32 mmol/L Final  . Glucose, Bld 09/23/2017 84  65 - 99 mg/dL Final  . BUN 09/23/2017 6  6 - 20 mg/dL Final  . Creatinine, Ser 09/23/2017 0.57* 0.61 - 1.24 mg/dL Final  . Calcium 09/23/2017 8.2* 8.9 - 10.3 mg/dL Final  . Total Protein 09/23/2017 4.5* 6.5 - 8.1 g/dL Final  . Albumin 09/23/2017 2.3* 3.5 - 5.0 g/dL Final  . AST 09/23/2017 27  15 - 41 U/L Final  . ALT 09/23/2017 27  17 - 63 U/L Final  . Alkaline Phosphatase 09/23/2017 58  38 -  126 U/L Final  . Total Bilirubin 09/23/2017 0.6  0.3 - 1.2 mg/dL Final  . GFR calc non Af Amer 09/23/2017 >60  >60 mL/min Final  . GFR calc Af Amer 09/23/2017 >60  >60 mL/min Final   Comment: (NOTE) The eGFR has been calculated using the CKD EPI equation. This calculation has not been validated in all clinical situations. eGFR's  persistently <60 mL/min signify possible Chronic Kidney Disease.   Philip [name] gap 09/23/2017 5  5 - 15 Final   Performed at [facility], [address] [place], [place] [address]  . WBC 09/23/2017 5.6  3.8 - 10.6 K/uL Final  . RBC 09/23/2017 2.91* 4.40 - 5.90 MIL/uL Final  . Hemoglobin 09/23/2017 11.1* 13.0 - 18.0 g/dL Final   RESULT REPEATED AND VERIFIED  . HCT 09/23/2017 30.2* 40.0 - 52.0 % Final   RESULT REPEATED AND VERIFIED  . MCV 09/23/2017 103.9* 80.0 - 100.0 fL Final  . MCH 09/23/2017 38.3* 26.0 - 34.0 pg Final  . MCHC 09/23/2017 36.9* 32.0 - 36.0 g/dL Final  . RDW 09/23/2017 14.4  11.5 - 14.5 % Final  . Platelets 09/23/2017 189  150 - 440 K/uL Final  . Neutrophils Relative % 09/23/2017 82  % Final  . Neutro Abs 09/23/2017 4.7  1.4 - 6.5 K/uL Final  . Lymphocytes Relative 09/23/2017 11  % Final  . Lymphs Abs 09/23/2017 0.6* 1.0 - 3.6 K/uL Final  . Monocytes Relative 09/23/2017 6  % Final  . Monocytes Absolute 09/23/2017 0.3  0.2 - 1.0 K/uL Final  . Eosinophils Relative 09/23/2017 1  % Final  . Eosinophils Absolute 09/23/2017 0.0  0 - 0.7 K/uL Final  . Basophils Relative 09/23/2017 0  % Final  . Basophils Absolute 09/23/2017 0.0  0 - 0.1 K/uL Final   Performed at [facility], [address] [place], [place] [address]  . Phosphorus 09/23/2017 3.0  2.5 - 4.6 mg/dL Final   Performed at [facility], [address] [place], [place] [address]  . Magnesium 09/23/2017 1.3* 1.7 - 2.4 mg/dL Final   Performed at [facility], [address] [place], [place] [address]    Assessment:  KAREE CHRISTOPHERSON is a 57 y.o. male with extensive stage small cell lung cancer.  He presented with a 2 year history of night sweats and a 36 pound weight loss in 6 months.  Right supraclavicular biopsy on 07/19/2016 revealed small cell carcinoma of the lung.  CEA was 1.7 on 07/31/2016.  Chest CT on 06/28/2016 revealed extensive adenopathy.  There were multiple lymph  nodes descending thoracic aorta, largest measuring 2.1 x 1.7 cm.  There was adenopathy in the aortopulmonary window with the largest confluence of lymph nodes measuring 4 x 2.6 cm. There was hilar adenopathy on the left measuring 2 x 1.9 cm. There were multiple enlarged lymph nodes in the pretracheal region, largest 1.7 x 1.7 cm.  There were lymph nodes to the left of the carina measuring 2.8 x 1.9 cm. There was a subcarinal lymph node measuring 2.4 x 1.6 cm. There was a 4 x 3 mm nodular opacity in the anterior segment of the left upper lobe.    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  There was nodal metastasis within the chest and supraclavicular/low jugular regions.  There was multifocal osseous metastasis.  There was possibly a pathologic fracture involving the posteromedial right tenth rib.  There was new left lower  lobe opacity with mild hypermetabolism, suspicious for infection or postobstructive pneumonitis.  He completed a course of Levaquin for apost-obstructive pneumonia.  He experienced transient positional left facial burning.  Head MRI on 07/26/2016 revealed no evidence of brain metastasis. There was punctate DWI hyperintensity along a high right frontal gyrus, possible recent infarct.  There was a small occipital bone metastasis.  Head MRA on 07/27/2016 revealed normal large and medium sized vessels.  Bilateral carotid duplex on 07/27/2016 revealed <50% stenosis in the right and left internal carotis arteries.  Echo on 07/27/2016 revealed an EF of 45-50% without cardiac source of emboli.  He received 4 cycles of carboplatin and etoposide (07/31/2016 - 10/02/2016) with OnPro Neulasta support.  Platelet nadir was 40,000 on day 16 of cycle #2 and 23,000 on day 15 of cycle #3.  He received 2 units of PRBC with cycle #3 and 1 unit with cycle #4.  He received Xgeva on 08/13/2016 (last 10/30/2016).  Chest CT on 09/11/2016 revealed a marked  response to therapy.  There was near complete resolution of left suprahilar mass and thoracic adenopathy.  There was interval sclerosis indicative of healing site of osseous metastasis.  Thoracic spine MRI on 10/22/2016 reveals sclerotic enhancing lesion at T7 vertebral body on the left compatible with metastasis. There was a healing fracture the right medial 10th rib.  PET scan on 11/12/2016 revealed a new 10 mm hypermetabolic left axillary lymph node (SUV 3.6) due to a left hand abscess.  There were no other sites of metabolically active disease.  He completed a course of thoracic spine radiation (3000 cGy in 10 fractions) on 01/11/2017.  He received PCI (3000 cGy in 15 fractions) from 01/28/2017 - 02/15/2017.  Chest, abdomen, and pelvic CT on 03/18/2017 revealed new areas of irregular ground-glass in the medial aspects of the right upper and right lower lobes as well as somewhat amorphous ground-glass in the lingula and left lower lobe. Time interval favored infectious or inflammatory lesions.  Osseous metastatic disease was stable.  There was resolved or resected left axillary lymph node.  Bone scan on 04/10/2017 revealed multiple foci of skeletal metastasis within the ribs, pelvis, and RIGHT clavicle.  Multiple areas of activity corresponding to sclerotic lesions on CT and non-hypermetabolic lesions on PET scan.  PET scan on 04/26/2017 revealed bandlike densities medially in the right upper lobe and medially in the right lower lobe were new from 11/12/2016 but were present on 03/18/2017, and had faint hypermetabolic activity. Radiation pneumonitis could have this appearance.  The morphology would be unusual for malignancy but surveillance was warranted.  There were scattered stable sclerotic bony lesions that were not hypermetabolic.  There was persistent accentuated activity in the vicinity of the anus may be physiologic but was technically nonspecific.  LEFT hip radiograph on 09/02/2017 revealed  degenerative changes in the bilateral hip joints, with the left being more significant than the right. There was no mention of new osseous lytic lesions. She  He has chemotherapy induced anemia.  Anemia work-up on 09/11/2016 revealed the following normal labs:  Ferritin (219), iron sat (28%), TIBC (227), B12 (3055), and folate (6.8).  He has folate deficiency.  Folate was 5.9 on 08/27/2017.  B12 was 542 on 08/27/2017.  He has a history of left lower extremity DVT a few months after his cardiac surgery in 08/2014.  He has peripheral vascular disease s/p stent placement in 03/2015.  He is on a Plavix.  He has not had a colonoscopy in 30 years.  He was ineligible for the AbbVie clinical trial (M16-298) secondary to his left hand abscess.  He underwent surgical debridement of a left hand abscess on 11/15/2016.  Wound cultures grew MRSA.  He was treated with emperic IV Vancomycin then Bactrim.   He was admitted to [facility] from 08/26/2017 - 08/30/2017 with nausea, vomiting, dehydration and electrolyte abnormalities.  Head MRI without contrast on 08/29/2017 revealed no evidence of metastatic disease. Prior UGI revealed no evidence of obstruction or gastroparesis.  Labs revealed no evidence of pancreatitis.  He was started on scheduled antiemetics and PPI BID.  AM cortisol on 08/28/2017 was low.  He was diagnosed with adrenal insufficiency.  He was started on prednisone 40 mg a day.  He was able to advance his diet.  Tube feeds were avoided.  Symptomatically, he complains of chronic pain in his back, hips, knees, and lower extremities. He is eating, however noted that everything "comes right back up". He complains on nausea with solid foods. He is drinking mainly supplement shakes at this point.  Weight has increased by 3 pounds since his last visit to the [name]. Abrasions to his RIGHT temple, RIGHT shoulder, and RIGHT knee are healing with no signs of infection.  Sodium is 126. Potassium 3.7. Magnesium is 1.3.    Plan: 1. Labs today: CBC with diff, CMP, Mg, Phos. 2. Discuss status of referrals:  Endocrinology ([place]) [facility] has received the referral for patient to been seen to discuss adrenal insufficiency. No appointment has been scheduled at this point. Patient continues on previously prescribed steroid dose.   Pain management - patient has been referred to several pain management clinics. Refused to see Dr. Dossie Arbour. Patient has scheduled appointments in 2 different clinics at this time. He is unsure which provider he will see. Appointment with Dr. Nathanial Rancher scheduled on 10/28/2017 @ 0900, and with Dr. Gillis Santa on 10/31/2017 @ 1400.  Dr. Mike Gip willing to provide short term course of Roxicodone 5 mg q6 PRN (Disp #30).  3. Discuss protein calorie malnutrition. Weight has increased 3 pounds since his last visit to the visit. Patient encouraged to increase intake of protein and calorie dense foods.  He was advised to take the oral supplements as recommended by cancer center RD. Discuss PEG tube placement. Patient notes that he has not heard back from GI. Call placed to Dr. Dorothey Baseman office. They are aware that patient is on anticoagulation therapy. Patient to hold anticoagulant starting today. He has been scheduled for PEF tube placement on 10/01/2017.  4. Discuss HYPOnatremia and HYPOmagnesemia. Sodium 126 and magnesium 1.3. Patient will need 1 L 0.9% NS with 3 g Mg today.   5. Discuss need for PET imaging to assess for active malignancy given ongoing weight loss issues and pain. Study has been denied by insurance. Will scheduled P2P to appeal decision in attempts to gain coverage for this study.  6. RTC on 10/03/2017 for MD assessment, labs (BMP, Mg, Phos), +/- Mg, and he will also need to meet with RD.    Honor Loh, NP  09/23/2017, 10:27 AM   I saw and evaluated the patient, participating in the key portions of the service and reviewing pertinent diagnostic studies and records.  I  reviewed the nurse practitioner's note and agree with the findings and the plan.  The assessment and plan were discussed with the patient.  Additional diagnostic studies of PET are needed to clarify disease status and would change the clinical management.  Multiple questions were  asked by the patient and answered.   Philip Stalls, MD 09/23/2017,10:27 AM

## 2017-09-24 ENCOUNTER — Inpatient Hospital Stay: Payer: Medicaid Other

## 2017-09-24 DIAGNOSIS — E871 Hypo-osmolality and hyponatremia: Secondary | ICD-10-CM

## 2017-09-24 DIAGNOSIS — C3490 Malignant neoplasm of unspecified part of unspecified bronchus or lung: Secondary | ICD-10-CM | POA: Diagnosis not present

## 2017-09-24 MED ORDER — SODIUM CHLORIDE 0.9 % IV SOLN
Freq: Once | INTRAVENOUS | Status: AC
  Administered 2017-09-24: 11:00:00 via INTRAVENOUS
  Filled 2017-09-24: qty 1000

## 2017-09-24 MED ORDER — SODIUM CHLORIDE 0.9 % IV SOLN
INTRAVENOUS | Status: DC
  Filled 2017-09-24: qty 1000

## 2017-09-24 MED ORDER — HEPARIN SOD (PORK) LOCK FLUSH 100 UNIT/ML IV SOLN
500.0000 [IU] | Freq: Once | INTRAVENOUS | Status: AC
  Administered 2017-09-24: 500 [IU] via INTRAVENOUS

## 2017-09-24 MED ORDER — SODIUM CHLORIDE 0.9 % IV SOLN
INTRAVENOUS | Status: DC
  Administered 2017-09-24: 11:00:00 via INTRAVENOUS
  Filled 2017-09-24 (×2): qty 1000

## 2017-09-24 NOTE — Progress Notes (Signed)
Verified magnesium with MD.  3gram in 1046ml over 3 hours

## 2017-09-25 ENCOUNTER — Other Ambulatory Visit: Payer: Self-pay | Admitting: Urgent Care

## 2017-09-25 DIAGNOSIS — C7951 Secondary malignant neoplasm of bone: Principal | ICD-10-CM

## 2017-09-25 DIAGNOSIS — C3490 Malignant neoplasm of unspecified part of unspecified bronchus or lung: Secondary | ICD-10-CM

## 2017-09-25 NOTE — Progress Notes (Signed)
PET scan has been denied by insurance x 3. No further appeal allowed per financials. Patient with continued unrelieved pain in his back and legs. With history of SCLC and previously osseous metastasis, further assessment is warranted at this point. Will proceed with repeat bone scan. Order entered and financials made aware. Pending approval of study, patient will be scheduled for the study and brought back in to clinic to discuss the results.   Honor Loh, MSN, APRN, FNP-C, CEN Oncology/Hematology Nurse Practitioner  [facility] 09/25/17 5:51 PM

## 2017-09-27 ENCOUNTER — Telehealth: Payer: Self-pay | Admitting: *Deleted

## 2017-09-27 NOTE — Telephone Encounter (Addendum)
  Per Velna Hatchet 09/26/17 staff message to schedule a Bone Scan.  Patient was scheduled as requested. Bone Scan is scheduled for 10/02/17 @ 10:00 add 1:00 Called him and made him aware of the Location, Date and Time of the appt.

## 2017-09-30 ENCOUNTER — Telehealth: Payer: Self-pay

## 2017-09-30 ENCOUNTER — Encounter: Payer: Self-pay | Admitting: Urgent Care

## 2017-09-30 MED ORDER — OSMOLITE 1.5 CAL PO LIQD: ORAL | 0 refills | Status: DC

## 2017-09-30 NOTE — Telephone Encounter (Signed)
Nutrition  Patient planning to have PEG tube placed tomorrow by Dr. Ilhan Madan Norris.  Called patient this am to review tube feeding regimen.  Patient does not have a preference for home health.    Intervention: Reviewed that patient to begin tube feeding with 58ml of water first, 59ml osmolite 1.5 (1/4 carton) and 84ml water after 4 times per day (8am, 12, 4pm and 8pm).  Patient verbalized understanding.  Reports that he is aware of appointment on Thursday, 6/20 and will be at appointment.  RD to adjust tube feeding based on lab results, tolerance.  Reviewed importance of lab check as at risk of refeeding syndrome. Tube feeding goal rate will need 4 cartons of osmolite 1.5 and likey promod 1ml BID for additional protein.   Will contact [facility] for enteral nutrition.    Valynn Schamberger B. Zenia Resides, [place], [place] Registered Dietitian [phone] (pager)

## 2017-09-30 NOTE — Progress Notes (Signed)
This encounter was created in error - please disregard.

## 2017-09-30 NOTE — Addendum Note (Signed)
Addended by: Jennet Maduro B on: 09/30/2017 10:21 AM   Modules accepted: Orders

## 2017-10-01 ENCOUNTER — Ambulatory Visit
Admission: RE | Admit: 2017-10-01 | Discharge: 2017-10-01 | Disposition: A | Discharge status: Home / Self Care | Payer: Medicaid Other | Source: Ambulatory Visit | Attending: Gastroenterology | Admitting: Gastroenterology

## 2017-10-01 ENCOUNTER — Other Ambulatory Visit: Payer: Self-pay | Admitting: Cardiovascular Disease

## 2017-10-01 ENCOUNTER — Ambulatory Visit: Payer: Medicaid Other | Admitting: Certified Registered Nurse Anesthetist

## 2017-10-01 ENCOUNTER — Other Ambulatory Visit: Payer: Self-pay | Admitting: Hematology and Oncology

## 2017-10-01 ENCOUNTER — Encounter: Payer: Self-pay | Admitting: Anesthesiology

## 2017-10-01 ENCOUNTER — Other Ambulatory Visit: Payer: Self-pay | Admitting: *Deleted

## 2017-10-01 ENCOUNTER — Encounter
Admission: RE | Disposition: A | Discharge status: Home / Self Care | Payer: Self-pay | Source: Ambulatory Visit | Attending: Gastroenterology

## 2017-10-01 DIAGNOSIS — Z539 Procedure and treatment not carried out, unspecified reason: Secondary | ICD-10-CM | POA: Insufficient documentation

## 2017-10-01 DIAGNOSIS — Z431 Encounter for attention to gastrostomy: Secondary | ICD-10-CM | POA: Diagnosis present

## 2017-10-01 DIAGNOSIS — Z931 Gastrostomy status: Secondary | ICD-10-CM

## 2017-10-01 DIAGNOSIS — M546 Pain in thoracic spine: Secondary | ICD-10-CM

## 2017-10-01 DIAGNOSIS — R112 Nausea with vomiting, unspecified: Secondary | ICD-10-CM

## 2017-10-01 HISTORY — PX: PEG PLACEMENT: SHX5437

## 2017-10-01 SURGERY — INSERTION, PEG TUBE
Anesthesia: General

## 2017-10-01 MED ORDER — SODIUM CHLORIDE 0.9 % IV SOLN: INTRAVENOUS | Status: DC

## 2017-10-01 MED ORDER — OXYCODONE HCL 5 MG PO TABS: 5.0000 mg | ORAL_TABLET | Freq: Four times a day (QID) | ORAL | 0 refills | Status: DC | PRN

## 2017-10-01 NOTE — Telephone Encounter (Signed)
Patient called asking for refill of his Oxycodone stating he only has 2 tabs left and he is having a feeding tube inserted today. He last got # 30 tabs on 09/23/17 so he is taking 3 -4 tabs per day. His pain clinic appointment is scheduled for 7/15 with one provider and 7/18 with another provider

## 2017-10-01 NOTE — Anesthesia Preprocedure Evaluation (Deleted)
Anesthesia Evaluation  Patient identified by MRN, date of birth, ID band Patient awake    Reviewed: Allergy & Precautions, NPO status , Patient's Chart, lab work & pertinent test results, reviewed documented beta blocker date and time   Airway Mallampati: II  TM Distance: >3 FB     Dental  (+) Chipped, Poor Dentition, Dental Advisory Given, Missing   Pulmonary pneumonia, resolved, Current Smoker,           Cardiovascular hypertension, Pt. on medications and Pt. on home beta blockers + CAD, + Past MI, + CABG and + Peripheral Vascular Disease       Neuro/Psych Seizures -, Well Controlled,  TIA Neuromuscular disease    GI/Hepatic GERD  ,  Endo/Other    Renal/GU      Musculoskeletal  (+) Arthritis ,   Abdominal   Peds  Hematology  (+) anemia ,   Anesthesia Other Findings Lung ca. EF 45-50.  Reproductive/Obstetrics                             Anesthesia Physical Anesthesia Plan  ASA: III  Anesthesia Plan: General   Post-op Pain Management:    Induction: Intravenous  PONV Risk Score and Plan:   Airway Management Planned:   Additional Equipment:   Intra-op Plan:   Post-operative Plan:   Informed Consent: I have reviewed the patients History and Physical, chart, labs and discussed the procedure including the risks, benefits and alternatives for the proposed anesthesia with the patient or authorized representative who has indicated his/her understanding and acceptance.     Plan Discussed with: CRNA  Anesthesia Plan Comments:         Anesthesia Quick Evaluation

## 2017-10-01 NOTE — OR Nursing (Signed)
Procedure cancelled per Dr Allen Norris.  Pt going to see if he can gain weight on his own before peg placement

## 2017-10-02 ENCOUNTER — Encounter
Admission: RE | Admit: 2017-10-02 | Discharge: 2017-10-02 | Disposition: A | Discharge status: Home / Self Care | Payer: Medicaid Other | Source: Ambulatory Visit | Attending: Urgent Care | Admitting: Urgent Care

## 2017-10-02 ENCOUNTER — Encounter: Payer: Self-pay | Admitting: Gastroenterology

## 2017-10-02 DIAGNOSIS — C3490 Malignant neoplasm of unspecified part of unspecified bronchus or lung: Secondary | ICD-10-CM | POA: Diagnosis not present

## 2017-10-02 DIAGNOSIS — C7951 Secondary malignant neoplasm of bone: Secondary | ICD-10-CM | POA: Insufficient documentation

## 2017-10-02 MED ORDER — TECHNETIUM TC 99M MEDRONATE IV KIT
22.8670 | PACK | Freq: Once | INTRAVENOUS | Status: AC | PRN
  Administered 2017-10-02: 22.867 via INTRAVENOUS

## 2017-10-03 ENCOUNTER — Inpatient Hospital Stay: Payer: Medicaid Other

## 2017-10-03 ENCOUNTER — Inpatient Hospital Stay (HOSPITAL_BASED_OUTPATIENT_CLINIC_OR_DEPARTMENT_OTHER): Payer: Medicaid Other | Admitting: Hematology and Oncology

## 2017-10-03 ENCOUNTER — Encounter: Payer: Self-pay | Admitting: Hematology and Oncology

## 2017-10-03 VITALS — BP 130/84 | HR 81 | Temp 97.4°F | Resp 16 | Wt 117.5 lb

## 2017-10-03 DIAGNOSIS — M25552 Pain in left hip: Secondary | ICD-10-CM

## 2017-10-03 DIAGNOSIS — R296 Repeated falls: Secondary | ICD-10-CM

## 2017-10-03 DIAGNOSIS — Z681 Body mass index (BMI) 19 or less, adult: Secondary | ICD-10-CM

## 2017-10-03 DIAGNOSIS — E871 Hypo-osmolality and hyponatremia: Secondary | ICD-10-CM

## 2017-10-03 DIAGNOSIS — Z9181 History of falling: Secondary | ICD-10-CM

## 2017-10-03 DIAGNOSIS — C3490 Malignant neoplasm of unspecified part of unspecified bronchus or lung: Secondary | ICD-10-CM

## 2017-10-03 DIAGNOSIS — F17210 Nicotine dependence, cigarettes, uncomplicated: Secondary | ICD-10-CM

## 2017-10-03 DIAGNOSIS — M549 Dorsalgia, unspecified: Secondary | ICD-10-CM

## 2017-10-03 DIAGNOSIS — G894 Chronic pain syndrome: Secondary | ICD-10-CM

## 2017-10-03 DIAGNOSIS — Z7189 Other specified counseling: Secondary | ICD-10-CM

## 2017-10-03 DIAGNOSIS — I739 Peripheral vascular disease, unspecified: Secondary | ICD-10-CM

## 2017-10-03 DIAGNOSIS — R5383 Other fatigue: Secondary | ICD-10-CM

## 2017-10-03 DIAGNOSIS — G8929 Other chronic pain: Secondary | ICD-10-CM

## 2017-10-03 DIAGNOSIS — R634 Abnormal weight loss: Secondary | ICD-10-CM

## 2017-10-03 DIAGNOSIS — Z86718 Personal history of other venous thrombosis and embolism: Secondary | ICD-10-CM

## 2017-10-03 DIAGNOSIS — D6481 Anemia due to antineoplastic chemotherapy: Secondary | ICD-10-CM

## 2017-10-03 DIAGNOSIS — R531 Weakness: Secondary | ICD-10-CM

## 2017-10-03 DIAGNOSIS — T451X5S Adverse effect of antineoplastic and immunosuppressive drugs, sequela: Secondary | ICD-10-CM

## 2017-10-03 DIAGNOSIS — Z9221 Personal history of antineoplastic chemotherapy: Secondary | ICD-10-CM | POA: Diagnosis not present

## 2017-10-03 DIAGNOSIS — R42 Dizziness and giddiness: Secondary | ICD-10-CM

## 2017-10-03 DIAGNOSIS — C7951 Secondary malignant neoplasm of bone: Secondary | ICD-10-CM

## 2017-10-03 DIAGNOSIS — I2510 Atherosclerotic heart disease of native coronary artery without angina pectoris: Secondary | ICD-10-CM

## 2017-10-03 DIAGNOSIS — C771 Secondary and unspecified malignant neoplasm of intrathoracic lymph nodes: Secondary | ICD-10-CM

## 2017-10-03 DIAGNOSIS — E876 Hypokalemia: Secondary | ICD-10-CM

## 2017-10-03 DIAGNOSIS — R51 Headache: Secondary | ICD-10-CM

## 2017-10-03 DIAGNOSIS — E2740 Unspecified adrenocortical insufficiency: Secondary | ICD-10-CM

## 2017-10-03 DIAGNOSIS — Z79899 Other long term (current) drug therapy: Secondary | ICD-10-CM

## 2017-10-03 DIAGNOSIS — E785 Hyperlipidemia, unspecified: Secondary | ICD-10-CM

## 2017-10-03 DIAGNOSIS — R110 Nausea: Secondary | ICD-10-CM

## 2017-10-03 DIAGNOSIS — E46 Unspecified protein-calorie malnutrition: Secondary | ICD-10-CM

## 2017-10-03 DIAGNOSIS — Z923 Personal history of irradiation: Secondary | ICD-10-CM

## 2017-10-03 LAB — BASIC METABOLIC PANEL
Anion gap: 7 (ref 5–15)
BUN: 12 mg/dL (ref 6–20)
CALCIUM: 9.3 mg/dL (ref 8.9–10.3)
CO2: 25 mmol/L (ref 22–32)
CREATININE: 0.57 mg/dL — AB (ref 0.61–1.24)
Chloride: 100 mmol/L — ABNORMAL LOW (ref 101–111)
GFR calc Af Amer: >60 mL/min (ref >60)
GLUCOSE: 80 mg/dL (ref 65–99)
Potassium: 4.4 mmol/L (ref 3.5–5.1)
SODIUM: 132 mmol/L — AB (ref 135–145)

## 2017-10-03 LAB — MAGNESIUM: Magnesium: 1.5 mg/dL — ABNORMAL LOW (ref 1.7–2.4)

## 2017-10-03 LAB — PHOSPHORUS: Phosphorus: 4.6 mg/dL (ref 2.5–4.6)

## 2017-10-03 MED ORDER — LIDOCAINE-PRILOCAINE 2.5-2.5 % EX CREA: TOPICAL_CREAM | CUTANEOUS | 3 refills | Status: DC

## 2017-10-03 MED ORDER — SODIUM CHLORIDE 0.9 % IV SOLN
Freq: Once | INTRAVENOUS | Status: AC
  Administered 2017-10-03: 10:00:00 via INTRAVENOUS
  Filled 2017-10-03: qty 1000

## 2017-10-03 MED ORDER — SODIUM CHLORIDE 0.9% FLUSH
10.0000 mL | INTRAVENOUS | Status: DC | PRN
  Administered 2017-10-03: 10 mL via INTRAVENOUS
  Filled 2017-10-03: qty 10

## 2017-10-03 MED ORDER — HEPARIN SOD (PORK) LOCK FLUSH 100 UNIT/ML IV SOLN
500.0000 [IU] | Freq: Once | INTRAVENOUS | Status: AC
  Administered 2017-10-03: 500 [IU] via INTRAVENOUS
  Filled 2017-10-03: qty 5

## 2017-10-03 MED ORDER — SUCRALFATE 1 G PO TABS: 1.0000 g | ORAL_TABLET | Freq: Three times a day (TID) | ORAL | 1 refills | Status: DC

## 2017-10-03 MED ORDER — MAGNESIUM SULFATE 2 GM/50ML IV SOLN
2.0000 g | Freq: Once | INTRAVENOUS | Status: AC
  Administered 2017-10-03: 2 g via INTRAVENOUS
  Filled 2017-10-03: qty 50

## 2017-10-03 NOTE — Progress Notes (Signed)
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  PEG tube was not placed on 6/18 due to patient gaining weight.  Patient reports that he still has nausea but has been trying to eat.  Reports drinking about 2 ensure plus shakes per day.  Reports that ate 1/2 steak with mushrooms, squash casserole and few bites key lime pie last night for supper.  Does not think he ate lunch yesterday but ate eggs and canadian bacon for breakfast.     Medications: reviewed  Labs: Na 132, K 4.4, phosphorus 4.6, Mag 1.5.    Anthropometrics:   Weight 117 lb 8 oz today increased from 112 lb on 6/10.     NUTRITION DIAGNOSIS: Malnutrition improving   MALNUTRITION DIAGNOSIS: severe malnutrition improving with weight gain   INTERVENTION:   Discussed with patient that he still remains underweight (BMI 16).  Encouraged him to continue to consume high calorie, high protein foods to continue weight gain.   Patient given case of ensure enlive today. Patient can likely stop potassium phosphate with normal K and Phosphorus. Can recheck level in 1-2 weeks to make sure levels are staying within normal limits.    Would recommend rechecking folate, thiamin, Vitamin C level before stopping supplementation.      MONITORING, EVALUATION, GOAL: weight trends, intake   NEXT VISIT: June 27 during infusion  Philip [name] B. Zenia Resides, [place], [place] Registered Dietitian [phone] (pager)

## 2017-10-03 NOTE — Progress Notes (Signed)
[facility] day:  10/03/2017   Chief Complaint: Philip [name] is a 57 y.o. male with extensive stage small cell lung cancer who is seen for review of interval bone scan and discussion regarding direction of therapy.  HPI:  The patient was last seen in the medical oncology clinic on 09/23/2017.  At that time,  he complained of chronic pain in his back, hips, knees, and lower extremities. He was eating, however noted that everything "comes right back up". He complained of nausea with solid foods. He was drinking mainly supplement shakes at this point.  Weight had increased by 3 pounds since his last visit to the clinic. Abrasions to his RIGHT temple, RIGHT shoulder, and RIGHT knee were healing with no signs of infection.  Sodium was 126. Potassium was 3.7. Magnesium was 1.3.   He received 1L of 0.9% NS and 3 grams of IV magnesium.  At last visit, we readdressed G tube placement.  An appointment was rescheduled with Dr. Allen Norris.  G tube was postponed at the patient's request because he was gaining weight.  Bone scan on 10/02/2017 revealed multiple sites (sternum, manubrium, LEFT scapula, BILATERAL anterior and posterior ribs, RIGHT L3 vertebral body, superior LEFT SI joint, and anterior LEFT iliac bone) of abnormal osseous tracer accumulation consistent with osseous metastatic disease, with 2 sites of subtle new uptake in the anterior RIGHT approximately fourth rib.  He has a recent history of a fall on his right side.  Symptomatically, patient notes that he is feeling better. He is advising that since he is back on his pain medication, his appetite has improved. Patient is using Roxicodone 5 mg q6 PRN (4 pills/day). Patient notes that he continues to be dizzy and is falling. Patient's most recent fall was onto his LEFT shoulder. This fall has occurred since his last visit to the clinic. Patient states, "It is always when I stand up and try to walk. I fall  frontwards".   Patient denies that he has experienced any B symptoms. He denies any interval infections. Patient's appetite has continued to improve. His nausea has also improved. Weight, compared to his last visit to the clinic, has increased by 5 pounds.   Patient complains of pain, rated 3/10, in the clinic today.    Past Medical History:  Diagnosis Date  . Anemia   . CAD (coronary artery disease)    a. 08/2014 Inf STEMI/CABG x 3 (LIMA->LAD, VG->Diag, RIMA->RCA).  . DVT, recurrent, lower extremity, acute, left ([place]) 2016  . GIB (gastrointestinal bleeding)    a. In the setting of DAPT-->tolerating plavix only.  . Hyperlipidemia   . Hypertensive heart disease   . Lung cancer ([place])    a. s/p chemo/radiation.  Marland Kitchen PAD (peripheral artery disease) ([place])    a. 03/2015 Periph Angio: short occlusion of L pop w/ evidence of embolization into the DP->5.0x50 mm Inova self-expanding stent;  b. 04/08/2015 ABI: R 1.12, L 0.99; c. 01/2017 LE Duplex: bilat SFA obstructive dzs, patent L pop.  . Tobacco abuse   . Toe amputation status, left [facility]) 07/09/2016   2017    Past Surgical History:  Procedure Laterality Date  . CARDIAC CATHETERIZATION N/A 08/24/2014   Procedure: Left Heart Cath and Coronary Angiography;  Surgeon: Isaias Cowman, MD;  Location: [place] CV LAB;  Service: Cardiovascular;  Laterality: N/A;  . CARDIAC CATHETERIZATION N/A 08/24/2014   Procedure: Coronary Stent Intervention;  Surgeon: Isaias Cowman, MD;  Location: [facility]  INVASIVE CV LAB;  Service: Cardiovascular;  Laterality: N/A;  . CORONARY ARTERY BYPASS GRAFT N/A 08/25/2014   Procedure: CORONARY ARTERY BYPASS GRAFTING (CABG), ON PUMP, TIMES THREE, USING BILATERAL MAMMARY ARTERIES, RIGHT GREATER SAPHENOUS VEIN HARVESTED ENDOSCOPICALLY;  Surgeon: Melrose Nakayama, MD;  Location: [place];  Service: Open Heart Surgery;  Laterality: N/A;  . ESOPHAGOGASTRODUODENOSCOPY (EGD) WITH PROPOFOL N/A 08/08/2017   Procedure:  ESOPHAGOGASTRODUODENOSCOPY (EGD) WITH PROPOFOL;  Surgeon: Jonathon Bellows, MD;  Location: [facility] ENDOSCOPY;  Service: Gastroenterology;  Laterality: N/A;  . INCISION AND DRAINAGE ABSCESS Left 11/15/2016   Procedure: INCISION AND DRAINAGE ABSCESS OF LEFT WRIST;  Surgeon: Roseanne Kaufman, MD;  Location: [place];  Service: Orthopedics;  Laterality: Left;  . PEG PLACEMENT N/A 10/01/2017   Procedure: PERCUTANEOUS ENDOSCOPIC GASTROSTOMY (PEG) PLACEMENT;  Surgeon: Lucilla Lame, MD;  Location: [facility] ENDOSCOPY;  Service: Endoscopy;  Laterality: N/A;  . PERIPHERAL VASCULAR CATHETERIZATION N/A 03/30/2015   Procedure: Abdominal Aortogram w/Lower Extremity;  Surgeon: Wellington Hampshire, MD;  Location: [place] CV LAB;  Service: Cardiovascular;  Laterality: N/A;  . PORTA CATH INSERTION N/A 07/27/2016   Procedure: Glori Luis Cath Insertion;  Surgeon: Algernon Huxley, MD;  Location: [place] CV LAB;  Service: Cardiovascular;  Laterality: N/A;  . TEE WITHOUT CARDIOVERSION N/A 08/25/2014   Procedure: TRANSESOPHAGEAL ECHOCARDIOGRAM (TEE);  Surgeon: Melrose Nakayama, MD;  Location: [place];  Service: Open Heart Surgery;  Laterality: N/A;    Family History  Problem Relation Age of Onset  . Hypertension Father   . Lung cancer Father   . Heart attack Mother 62       STENT  . Hyperlipidemia Mother   . Heart attack Brother   . Heart attack Brother   . Breast cancer Sister   . Heart attack Sister   . Colon cancer Paternal Grandfather     Social History:  reports that he has been smoking cigarettes.  He has been smoking about 0.25 packs per day. He has never used smokeless tobacco. He reports that he drinks alcohol. He reports that he does not use drugs.  The patient smokes 1/2 pack of cigarettes/day (previously 2 1/2 ppd).  He started smoking in his teens.  He denies any exposure to radiation or toxins.  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  He lives in [place].  His wife is named Publishing copy.  He is accompanied by  his wife today.  Allergies:  Allergies  Allergen Reactions  . Tylenol [Acetaminophen] Palpitations  . Amitriptyline Other (See Comments)    Burning sensation all over  . Aspirin   . Contrast Media [Iodinated Diagnostic Agents] Other (See Comments)    Pt states that he got "knots behind his ears"  . Nsaids Other (See Comments)    Blood in stools  . Ibuprofen Other (See Comments) and Nausea Only    Blood in stools  . Tape Rash and Other (See Comments)    PLEASE USE COBAN WRAP; THE PATIENT'S SKIN TEARS WHEN BANDAGES ARE REMOVED!!    Current Medications: Current Outpatient Medications  Medication Sig Dispense Refill  . atorvastatin (LIPITOR) 40 MG tablet TAKE 1 TABLET BY MOUTH DAILY 90 tablet 3  . Calcium Carb-Cholecalciferol (CALCIUM + D3 PO) Take 1 tablet by mouth 3 (three) times daily.    . clopidogrel (PLAVIX) 75 MG tablet Take 1 tablet (75 mg total) by mouth daily. 90 tablet 3  . diphenhydrAMINE (BENADRYL) 25 mg capsule Take 25 mg by mouth 2 (  two) times daily as needed for allergies.     . famotidine (PEPCID) 20 MG tablet Take 1 tablet (20 mg total) by mouth 2 (two) times daily. 60 tablet 0  . feeding supplement, ENSURE ENLIVE, (ENSURE ENLIVE) LIQD Take 237 mLs by mouth 3 (three) times daily between meals. (Patient taking differently: Take 237 mLs by mouth 2 (two) times daily between meals. ) 237 mL 12  . FLOVENT HFA 220 MCG/ACT inhaler INL 2 PFS PO BID  5  . fluconazole (DIFLUCAN) 100 MG tablet Take 1 tablet by mouth daily.  3  . folic acid (FOLVITE) 1 MG tablet Take 1 tablet (1 mg total) by mouth daily. 30 tablet 0  . hydrochlorothiazide (HYDRODIURIL) 12.5 MG tablet Take 1 tablet by mouth daily.  2  . lidocaine-prilocaine (EMLA) cream Apply to affected area once 30 g 3  . magnesium oxide (MAG-OX) 400 (241.3 Mg) MG tablet Take 1 tablet (400 mg total) by mouth 3 (three) times daily. 90 tablet 3  . metoCLOPramide (REGLAN) 5 MG tablet Take 1 tablet (5 mg total) by mouth 4 (four)  times daily -  before meals and at bedtime. 100 tablet 0  . metoprolol tartrate (LOPRESSOR) 25 MG tablet TAKE 1 TABLET BY MOUTH TWICE DAILY 180 tablet 3  . Multiple Vitamin (MULTIVITAMIN WITH MINERALS) TABS tablet Take 1 tablet by mouth daily. 30 tablet 1  . OLANZapine zydis (ZYPREXA) 10 MG disintegrating tablet Take 1 tablet (10 mg total) by mouth at bedtime. 30 tablet 0  . ondansetron (ZOFRAN-ODT) 8 MG disintegrating tablet Take 1 tablet (8 mg total) by mouth every 8 (eight) hours as needed for nausea or vomiting. 30 tablet 1  . oxyCODONE (OXY IR/ROXICODONE) 5 MG immediate release tablet Take 1 tablet (5 mg total) by mouth every 6 (six) hours as needed for severe pain. 30 tablet 0  . potassium phosphate, monobasic, (K-PHOS ORIGINAL) 500 MG tablet Take 1 tablet (500 mg total) by mouth 4 (four) times daily -  with meals and at bedtime. 10 tablet 0  . predniSONE (DELTASONE) 10 MG tablet Take 40 mg oral daily for 2 weeks ( until 09/12/17) Then take 30 mg oral daily for next 2 weeks ( 09/13/17- 09/26/17) Then take 20 mg oral daily for next 2 weeks  09/27/17- 10/11/17) Then take 10 mg oral daily for next 2 weeks. 50 tablet 1  . PROAIR HFA 108 (90 Base) MCG/ACT inhaler INL 2 PFS PO Q 4 TO 6 H PRN  5  . promethazine (PHENERGAN) 25 MG suppository Place 1 suppository (25 mg total) rectally every 6 (six) hours as needed for nausea or vomiting. 12 each 5  . senna-docusate (SENOKOT-S) 8.6-50 MG tablet Take 1 tablet by mouth at bedtime as needed for mild constipation. 30 tablet 0  . STIOLTO RESPIMAT 2.5-2.5 MCG/ACT AERS INL 2 PFS PO QD  5  . thiamine 100 MG tablet Take 1 tablet (100 mg total) by mouth daily. 30 tablet 1  . vitamin C (VITAMIN C) 500 MG tablet Take 1 tablet (500 mg total) by mouth 2 (two) times daily. 60 tablet 1  . Vitamin D, Ergocalciferol, (DRISDOL) 50000 units CAPS capsule TK ONE C PO Q WEEK  5  . bisacodyl (DULCOLAX) 5 MG EC tablet Take 1 tablet (5 mg total) by mouth daily as needed for moderate  constipation. (Patient not taking: Reported on 10/03/2017) 30 tablet 0  . clotrimazole (MYCELEX) 10 MG troche Take 1 lozenge (10 mg total) by mouth 4 (  four) times daily. (Patient not taking: Reported on 10/03/2017) 35 lozenge 0   No current facility-administered medications for this visit.    Facility-Administered Medications Ordered in Other Visits  Medication Dose Route Frequency Provider Last Rate Last Dose  . heparin lock flush 100 unit/mL  500 Units Intravenous Once Corcoran, Melissa C, MD      . heparin lock flush 100 unit/mL  500 Units Intravenous Once Corcoran, Melissa C, MD      . heparin lock flush 100 unit/mL  500 Units Intravenous Once Corcoran, Melissa C, MD      . sodium chloride flush (NS) 0.9 % injection 10 mL  10 mL Intravenous PRN Lequita Asal, MD   10 mL at 09/10/17 0847  . sodium chloride flush (NS) 0.9 % injection 10 mL  10 mL Intravenous PRN Lequita Asal, MD   10 mL at 10/03/17 0856    Review of Systems  Constitutional: Positive for malaise/fatigue. Negative for diaphoresis, fever and weight loss (weight up 5 pounds).       "I feel better. I am eating and gaining weight. I don't need that feeding tube I don't think".  HENT: Negative.  Negative for congestion, nosebleeds, sinus pain and sore throat.   Eyes: Negative.  Negative for double vision, photophobia, pain and discharge.  Respiratory: Negative for cough, hemoptysis, sputum production and shortness of breath.   Cardiovascular: Positive for claudication (chronic; followed by cardiology for known PVD; no further planned interventions at this time). Negative for chest pain, palpitations, orthopnea, leg swelling (bilateral ankle edema) and PND.  Gastrointestinal: Positive for nausea and vomiting. Negative for abdominal pain, blood in stool, constipation, diarrhea and melena.  Genitourinary: Negative for dysuria, frequency, hematuria and urgency.  Musculoskeletal: Positive for back pain (chronic), falls and  joint pain (LEFT hip; Left shoulder pain s/p recent fall). Negative for myalgias.       BLE pain related to known PVD  Skin: Negative for itching and rash.       Healing areas of abrasion to RIGHT shoulder and RIGHT knee.   Neurological: Positive for dizziness, weakness (generalized) and headaches. Negative for tremors.  Endo/Heme/Allergies: Does not bruise/bleed easily.  Psychiatric/Behavioral: Negative for depression, memory loss, substance abuse and suicidal ideas. The patient is not nervous/anxious and does not have insomnia.   All other systems reviewed and are negative.  Performance status (ECOG): 2 - Symptomatic, <50% confined to bed  Physical Exam: Blood pressure 130/84, pulse 81, temperature (!) 97.4 F (36.3 C), temperature source Tympanic, resp. rate 16, weight 117 lb 8 oz (53.3 kg), SpO2 99 %. GENERAL:  Thin gentleman sitting comfortably in a wheelchair in the exam room in no acute distress.  He is examined in the chair. MENTAL STATUS:  Alert and oriented to person, place and time. HEAD:  Short hair.  Normocephalic, atraumatic, face symmetric, no Cushingoid features. EYES:  Brown eyes.  Pupils equal round and reactive to light and accomodation.  No conjunctivitis or scleral icterus. ENT:  Oropharynx clear without lesion.  Tongue normal. Mucous membranes moist.  RESPIRATORY:  Clear to auscultation without rales, wheezes or rhonchi. CARDIOVASCULAR:  Regular rate and rhythm without murmur, rub or gallop. ABDOMEN:  Scaphoid.  Soft, non-tender, with active bowel sounds, and no hepatosplenomegaly.  No masses. SKIN:  Well healed trauma to right side.  No rashes, ulcers or lesions. EXTREMITIES: No edema, no skin discoloration or tenderness.  No palpable cords. LYMPH NODES: No palpable cervical, supraclavicular, axillary or inguinal adenopathy  NEUROLOGICAL: Unremarkable. PSYCH:  Appropriate.    Infusion on 10/03/2017  Component Date Value Ref Range Status  . Magnesium 10/03/2017  1.5* 1.7 - 2.4 mg/dL Final   Performed at [facility], [address], [place], [place] [address]  . Sodium 10/03/2017 132* 135 - 145 mmol/L Final  . Potassium 10/03/2017 4.4  3.5 - 5.1 mmol/L Final  . Chloride 10/03/2017 100* 101 - 111 mmol/L Final  . CO2 10/03/2017 25  22 - 32 mmol/L Final  . Glucose, Bld 10/03/2017 80  65 - 99 mg/dL Final  . BUN 10/03/2017 12  6 - 20 mg/dL Final  . Creatinine, Ser 10/03/2017 0.57* 0.61 - 1.24 mg/dL Final  . Calcium 10/03/2017 9.3  8.9 - 10.3 mg/dL Final  . GFR calc non Af Amer 10/03/2017 >60  >60 mL/min Final  . GFR calc Af Amer 10/03/2017 >60  >60 mL/min Final   Comment: (NOTE) The eGFR has been calculated using the CKD EPI equation. This calculation has not been validated in all clinical situations. eGFR's persistently <60 mL/min signify possible Chronic Kidney Disease.   Georgiann Hahn gap 10/03/2017 7  5 - 15 Final   Performed at [facility], [address] [place], [place] [address]    Assessment:  VIN YONKE is a 57 y.o. male with extensive stage small cell lung cancer.  He presented with a 2 year history of night sweats and a 36 pound weight loss in 6 months.  Right supraclavicular biopsy on 07/19/2016 revealed small cell carcinoma of the lung.  CEA was 1.7 on 07/31/2016.  Chest CT on 06/28/2016 revealed extensive adenopathy.  There were multiple lymph nodes descending thoracic aorta, largest measuring 2.1 x 1.7 cm.  There was adenopathy in the aortopulmonary window with the largest confluence of lymph nodes measuring 4 x 2.6 cm. There was hilar adenopathy on the left measuring 2 x 1.9 cm. There were multiple enlarged lymph nodes in the pretracheal region, largest 1.7 x 1.7 cm.  There were lymph nodes to the left of the carina measuring 2.8 x 1.9 cm. There was a subcarinal lymph node measuring 2.4 x 1.6 cm. There was a 4 x 3 mm nodular opacity in the anterior segment of the left upper lobe.    PET scan on 07/09/2016 revealed  central left upper lobe/suprahilar primary bronchogenic carcinoma.  There was nodal metastasis within the chest and supraclavicular/low jugular regions.  There was multifocal osseous metastasis.  There was possibly a pathologic fracture involving the posteromedial right tenth rib.  There was new left lower lobe opacity with mild hypermetabolism, suspicious for infection or postobstructive pneumonitis.  He completed a course of Levaquin for apost-obstructive pneumonia.  He experienced transient positional left facial burning.  Head MRI on 07/26/2016 revealed no evidence of brain metastasis. There was punctate DWI hyperintensity along a high right frontal gyrus, possible recent infarct.  There was a small occipital bone metastasis.  Head MRA on 07/27/2016 revealed normal large and medium sized vessels.  Bilateral carotid duplex on 07/27/2016 revealed <50% stenosis in the right and left internal carotis arteries.  Echo on 07/27/2016 revealed an EF of 45-50% without cardiac source of emboli.  He received 4 cycles of carboplatin and etoposide (07/31/2016 - 10/02/2016) with OnPro Neulasta support.  Platelet nadir was 40,000 on day 16 of cycle #2 and 23,000 on day 15 of cycle #3.  He received 2 units of PRBC with cycle #3 and 1 unit with cycle #4.  He received  Xgeva on 08/13/2016 (last 10/30/2016).  Chest CT on 09/11/2016 revealed a marked response to therapy.  There was near complete resolution of left suprahilar mass and thoracic adenopathy.  There was interval sclerosis indicative of healing site of osseous metastasis.  Thoracic spine MRI on 10/22/2016 reveals sclerotic enhancing lesion at T7 vertebral body on the left compatible with metastasis. There was a healing fracture the right medial 10th rib.  PET scan on 11/12/2016 revealed a new 10 mm hypermetabolic left axillary lymph node (SUV 3.6) due to a left hand abscess.  There were no other sites of metabolically active disease.  He completed a course of  thoracic spine radiation (3000 cGy in 10 fractions) on 01/11/2017.  He received PCI (3000 cGy in 15 fractions) from 01/28/2017 - 02/15/2017.  Chest, abdomen, and pelvic CT on 03/18/2017 revealed new areas of irregular ground-glass in the medial aspects of the right upper and right lower lobes as well as somewhat amorphous ground-glass in the lingula and left lower lobe. Time interval favored infectious or inflammatory lesions.  Osseous metastatic disease was stable.  There was resolved or resected left axillary lymph node.  Bone scan on 04/10/2017 revealed multiple foci of skeletal metastasis within the ribs, pelvis, and RIGHT clavicle.  Multiple areas of activity corresponding to sclerotic lesions on CT and non-hypermetabolic lesions on PET scan.  PET scan on 04/26/2017 revealed bandlike densities medially in the right upper lobe and medially in the right lower lobe were new from 11/12/2016 but were present on 03/18/2017, and had faint hypermetabolic activity. Radiation pneumonitis could have this appearance.  The morphology would be unusual for malignancy but surveillance was warranted.  There were scattered stable sclerotic bony lesions that were not hypermetabolic.  There was persistent accentuated activity in the vicinity of the anus may be physiologic but was technically nonspecific.  LEFT hip radiograph on 09/02/2017 revealed degenerative changes in the bilateral hip joints, with the left being more significant than the right. There was no mention of new osseous lytic lesions.  Bone scan on 10/02/2017 revealed multiple sites (sternum, manubrium, LEFT scapula, BILATERAL anterior and posterior ribs, RIGHT L3 vertebral body, superior LEFT SI joint, and anterior LEFT iliac bone) of abnormal osseous tracer accumulation consistent with osseous metastatic disease, with 2 sites of subtle new uptake in the anterior RIGHT approximately fourth rib.  He fell on his right side prior to imaging.  He has  chemotherapy induced anemia.  Anemia work-up on 09/11/2016 revealed the following normal labs:  Ferritin (219), iron sat (28%), TIBC (227), B12 (3055), and folate (6.8).  He has folate deficiency.  Folate was 5.9 on 08/27/2017.  B12 was 542 on 08/27/2017.  He has a history of left lower extremity DVT a few months after his cardiac surgery in 08/2014.  He has peripheral vascular disease s/p stent placement in 03/2015.  He is on a Plavix.  He has not had a colonoscopy in 30 years.  He was ineligible for the AbbVie clinical trial ([id number]) secondary to his left hand abscess.  He underwent surgical debridement of a left hand abscess on 11/15/2016.  Wound cultures grew MRSA.  He was treated with emperic IV Vancomycin then Bactrim.   He was [facility] to [facility] from 08/26/2017 - 08/30/2017 with nausea, vomiting, dehydration and electrolyte abnormalities.  Head MRI without contrast on 08/29/2017 revealed no evidence of metastatic disease. Prior UGI revealed no evidence of obstruction or gastroparesis.  Labs revealed no evidence of pancreatitis.  He was started on scheduled  antiemetics and PPI BID.  AM cortisol on 08/28/2017 was low.  He was diagnosed with adrenal insufficiency.  He was started on prednisone 40 mg a day.  He was able to advance his diet.  Tube feeds were avoided.  Symptomatically, patient is improving. He notes that his pain is "better" with the use of Roxicodone 5 mg q6 hours. Patient continues to have generalized weakness. He has sustained another mechanical fall, with resulting LEFT shoulder pain, since his last visit. Patient is eatign better. His weight is up 5 pounds. He refused PEG tube placement. Exam is unremarkable. Magnesium is 1.5.  Plan: 1. Labs today: BMP, Mg, Phos. 2. Discuss interval bone scan - multiple osseous lytic lesions observed. It is unknown if these ares are active at this point.   Discuss plan for PET scan to assess metabolic activity of osseous lesions that were  previously not hypermetabolic. New areas have declared since his last bone scan that warrant further assessment for active metastatic disease via PET imaging. History is notable for a fall to the right side prior to his bone scan.  Discussed that active disease confirmed, we will need to plan on reinitiating systemic chemotherapy and Xgeva. We are currently awaiting on insurance approval of desired study. Patient verbalizes understanding and agreement.  3. Discuss GI consult with Dr. Allen Norris and patient's decision to postpone PEG tube placement.   Patient gaining weight, however severe protein calorie malnutrition remains an issue. Patient has an underweight BMI of 16.3 .  Patient encouraged to increase intake of protein and calorie dense foods.  Will have him follow up with RD today in infusion for further discussion and nutritional planning.  4. Discuss weakness and falls. Will schedule MRI of the brain with and without contrast to further assess for brain metastasis given his SCLC diagnosis.  5. Discuss HYPOnatremia and HYPOmagnesemia. Sodium 132 (improved) and magnesium 1.5. Patient will need 2 g intravenous Mg today.   6. Discuss reflux. Current medication (famotidine) is not working. Asking to go back on pantoprazole. There is a drug-drug interaction between pantoprazole and the clopidogrel that patient is taking. Will continue famotine 20 mg BID. Will add Carafate 1 g TID.  7. Discuss symptom management.  Patient has antiemetics and pain medications at home to use on a PRN basis. Patient  advising that the  prescribed interventions are adequate at this point. Continue all medications as previously prescribed.   Discussed need to keep scheduled appointments with pain clinic to establish care and management of his chronic pain condition.   He has been referred to several pain management clinics. Refused to see Dr. Dossie Arbour.   Patient has scheduled appointments in 2 different clinics at this time.  He is unsure which provider he will see. Appointment with Dr. Nathanial Rancher scheduled on 10/28/2017 @ 0900, and with Dr. Gillis Santa on 10/31/2017 @ 1400.    Dr. Mike Gip willing to provide short term pain management for this patient.  8. RTC in 1 week for MD assessment, labs (CBC with diff, BMP, Mg, folate, thiamine, vitamin C), +/- IV Mg, and review of scans.    Honor Loh, NP  10/03/2017, 9:44 AM   I saw and evaluated the patient, participating in the key portions of the service and reviewing pertinent diagnostic studies and records.  I reviewed the nurse practitioner's note and agree with the findings and the plan.  The assessment and plan were discussed with the patient.  Additional diagnostic studies of PET are needed  to clarify disease status and would change the clinical management.  Multiple questions were asked by the patient and answered.   Nolon Stalls, MD 10/03/2017,9:44 AM

## 2017-10-03 NOTE — Progress Notes (Signed)
Pt in for follow up, did not have peg tube placed yesterday. States had gained weight and MD said since he was having weight gain could hold off.

## 2017-10-07 ENCOUNTER — Telehealth: Payer: Self-pay | Admitting: *Deleted

## 2017-10-07 NOTE — Telephone Encounter (Signed)
Per Gaspar Bidding 10/05/17 staff message to schedule MRI.  MRI was scheduled for 10/16/17 @ 8p.m @ [facility]  [id number] [place] [address]  [phone] I left a message on the patient vmail to make him aware of the date and time of  This scheduled appt.

## 2017-10-10 ENCOUNTER — Other Ambulatory Visit: Payer: Self-pay | Admitting: Hematology and Oncology

## 2017-10-10 ENCOUNTER — Other Ambulatory Visit: Payer: Self-pay | Admitting: Urgent Care

## 2017-10-10 ENCOUNTER — Telehealth: Payer: Self-pay | Admitting: *Deleted

## 2017-10-10 ENCOUNTER — Inpatient Hospital Stay: Payer: Medicaid Other

## 2017-10-10 ENCOUNTER — Inpatient Hospital Stay: Payer: Medicaid Other | Admitting: Hematology and Oncology

## 2017-10-10 DIAGNOSIS — M546 Pain in thoracic spine: Secondary | ICD-10-CM

## 2017-10-10 DIAGNOSIS — C3490 Malignant neoplasm of unspecified part of unspecified bronchus or lung: Secondary | ICD-10-CM

## 2017-10-10 MED ORDER — OXYCODONE HCL 5 MG PO TABS: 5.0000 mg | ORAL_TABLET | Freq: Four times a day (QID) | ORAL | 0 refills | Status: DC | PRN

## 2017-10-10 MED ORDER — LIDOCAINE-PRILOCAINE 2.5-2.5 % EX CREA: TOPICAL_CREAM | CUTANEOUS | 3 refills | Status: DC

## 2017-10-10 NOTE — Telephone Encounter (Signed)
Called patient to let him know MD has called pain medication prescription to pharmacy.  MD wants patient to keep pain diary and bring to his next appointment.  He is asked to record his pain level, time he takes medication, pain level in one hour post medication.  Verbalized under

## 2017-10-10 NOTE — Telephone Encounter (Signed)
Called patient to inform him that he does not need to come for today's appointment since he did not get his scan.  He states he needs refill on oxycodone 5 mg and EMLA cream.

## 2017-10-10 NOTE — Progress Notes (Signed)
Nutrition  RD planning to follow-up with patient during infusion today but appointments changed.  RD not able to see patient today.  Philip [name] B. Philip [name], Philip [name], Philip [name] Registered Dietitian [phone] (pager)

## 2017-10-10 NOTE — Telephone Encounter (Signed)
Patient came today for weight check only - 118#.   MD notified.

## 2017-10-10 NOTE — Progress Notes (Deleted)
[facility] day:  10/10/2017   Chief Complaint: Philip [name] is a 57 y.o. male with extensive stage small cell lung cancer who is seen for 1 week assessment.  HPI:  The patient was last seen in the medical oncology clinic on 10/03/2017.  At that time, he was improving.  His pain was "better" with the use of Roxicodone 5 mg q6 hours. Patient continued to have generalized weakness. He had sustained another mechanical fall, with resulting LEFT shoulder pain, since his last visit. Patient was eatign better. PEG tube had been postponed.  His weight was up 5 pounds.  Exam was unremarkable. Magnesium was1.5.  We discussed his bone scan at last visit.  A PET scan was ordered to assess whether the bone lesions were active (last PET negative).  Head MRI with contrast was ordered.  He met with nutrition.  He received IV magnesium.  During the interim,   Past Medical History:  Diagnosis Date  . Anemia   . CAD (coronary artery disease)    a. 08/2014 Inf STEMI/CABG x 3 (LIMA->LAD, VG->Diag, RIMA->RCA).  . DVT, recurrent, lower extremity, acute, left ([place]) 2016  . GIB (gastrointestinal bleeding)    a. In the setting of DAPT-->tolerating plavix only.  . Hyperlipidemia   . Hypertensive heart disease   . Lung cancer ([place])    a. s/p chemo/radiation.  Marland Kitchen PAD (peripheral artery disease) ([place])    a. 03/2015 Periph Angio: short occlusion of L pop w/ evidence of embolization into the DP->5.0x50 mm Inova self-expanding stent;  b. 04/08/2015 ABI: R 1.12, L 0.99; c. 01/2017 LE Duplex: bilat SFA obstructive dzs, patent L pop.  . Tobacco abuse   . Toe amputation status, left [facility]) 07/09/2016   2017    Past Surgical History:  Procedure Laterality Date  . CARDIAC CATHETERIZATION N/A 08/24/2014   Procedure: Left Heart Cath and Coronary Angiography;  Surgeon: Isaias Cowman, MD;  Location: [place] CV LAB;  Service: Cardiovascular;  Laterality: N/A;  .  CARDIAC CATHETERIZATION N/A 08/24/2014   Procedure: Coronary Stent Intervention;  Surgeon: Isaias Cowman, MD;  Location: [place] CV LAB;  Service: Cardiovascular;  Laterality: N/A;  . CORONARY ARTERY BYPASS GRAFT N/A 08/25/2014   Procedure: CORONARY ARTERY BYPASS GRAFTING (CABG), ON PUMP, TIMES THREE, USING BILATERAL MAMMARY ARTERIES, RIGHT GREATER SAPHENOUS VEIN HARVESTED ENDOSCOPICALLY;  Surgeon: Melrose Nakayama, MD;  Location: [place];  Service: Open Heart Surgery;  Laterality: N/A;  . ESOPHAGOGASTRODUODENOSCOPY (EGD) WITH PROPOFOL N/A 08/08/2017   Procedure: ESOPHAGOGASTRODUODENOSCOPY (EGD) WITH PROPOFOL;  Surgeon: Jonathon Bellows, MD;  Location: [facility] ENDOSCOPY;  Service: Gastroenterology;  Laterality: N/A;  . INCISION AND DRAINAGE ABSCESS Left 11/15/2016   Procedure: INCISION AND DRAINAGE ABSCESS OF LEFT WRIST;  Surgeon: Roseanne Kaufman, MD;  Location: [place];  Service: Orthopedics;  Laterality: Left;  . PEG PLACEMENT N/A 10/01/2017   Procedure: PERCUTANEOUS ENDOSCOPIC GASTROSTOMY (PEG) PLACEMENT;  Surgeon: Lucilla Lame, MD;  Location: [facility] ENDOSCOPY;  Service: Endoscopy;  Laterality: N/A;  . PERIPHERAL VASCULAR CATHETERIZATION N/A 03/30/2015   Procedure: Abdominal Aortogram w/Lower Extremity;  Surgeon: Wellington Hampshire, MD;  Location: [place] CV LAB;  Service: Cardiovascular;  Laterality: N/A;  . PORTA CATH INSERTION N/A 07/27/2016   Procedure: Glori Luis Cath Insertion;  Surgeon: Algernon Huxley, MD;  Location: [place] CV LAB;  Service: Cardiovascular;  Laterality: N/A;  . TEE WITHOUT CARDIOVERSION N/A 08/25/2014   Procedure: TRANSESOPHAGEAL ECHOCARDIOGRAM (TEE);  Surgeon: Melrose Nakayama, MD;  Location: [facility] OR;  Service: Open Heart Surgery;  Laterality: N/A;    Family History  Problem Relation Age of Onset  . Hypertension Father   . Lung cancer Father   . Heart attack Mother 77       STENT  . Hyperlipidemia Mother   . Heart attack Brother   . Heart attack Brother   . Breast  cancer Sister   . Heart attack Sister   . Colon cancer Paternal Grandfather     Social History:  reports that he has been smoking cigarettes.  He has been smoking about 0.25 packs per day. He has never used smokeless tobacco. He reports that he drinks alcohol. He reports that he does not use drugs.  The patient smokes 1/2 pack of cigarettes/day (previously 2 1/2 ppd).  He started smoking in his teens.  He denies any exposure to radiation or toxins.  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  He lives in [place].  His wife is named Publishing copy.  He is accompanied by his wife today.  Allergies:  Allergies  Allergen Reactions  . Tylenol [Acetaminophen] Palpitations  . Amitriptyline Other (See Comments)    Burning sensation all over  . Aspirin   . Contrast Media [Iodinated Diagnostic Agents] Other (See Comments)    Pt states that he got "knots behind his ears"  . Nsaids Other (See Comments)    Blood in stools  . Ibuprofen Other (See Comments) and Nausea Only    Blood in stools  . Tape Rash and Other (See Comments)    PLEASE USE COBAN WRAP; THE PATIENT'S SKIN TEARS WHEN BANDAGES ARE REMOVED!!    Current Medications: Current Outpatient Medications  Medication Sig Dispense Refill  . atorvastatin (LIPITOR) 40 MG tablet TAKE 1 TABLET BY MOUTH DAILY 90 tablet 3  . bisacodyl (DULCOLAX) 5 MG EC tablet Take 1 tablet (5 mg total) by mouth daily as needed for moderate constipation. (Patient not taking: Reported on 10/03/2017) 30 tablet 0  . Calcium Carb-Cholecalciferol (CALCIUM + D3 PO) Take 1 tablet by mouth 3 (three) times daily.    . clopidogrel (PLAVIX) 75 MG tablet Take 1 tablet (75 mg total) by mouth daily. 90 tablet 3  . clotrimazole (MYCELEX) 10 MG troche Take 1 lozenge (10 mg total) by mouth 4 (four) times daily. (Patient not taking: Reported on 10/03/2017) 35 lozenge 0  . diphenhydrAMINE (BENADRYL) 25 mg capsule Take 25 mg by mouth 2 (two) times daily as needed for allergies.      . famotidine (PEPCID) 20 MG tablet Take 1 tablet (20 mg total) by mouth 2 (two) times daily. 60 tablet 0  . feeding supplement, ENSURE ENLIVE, (ENSURE ENLIVE) LIQD Take 237 mLs by mouth 3 (three) times daily between meals. (Patient taking differently: Take 237 mLs by mouth 2 (two) times daily between meals. ) 237 mL 12  . FLOVENT HFA 220 MCG/ACT inhaler INL 2 PFS PO BID  5  . fluconazole (DIFLUCAN) 100 MG tablet Take 1 tablet by mouth daily.  3  . folic acid (FOLVITE) 1 MG tablet Take 1 tablet (1 mg total) by mouth daily. 30 tablet 0  . hydrochlorothiazide (HYDRODIURIL) 12.5 MG tablet Take 1 tablet by mouth daily.  2  . lidocaine-prilocaine (EMLA) cream Apply to affected area once 30 g 3  . magnesium oxide (MAG-OX) 400 (241.3 Mg) MG tablet Take 1 tablet (400 mg total) by mouth 3 (three) times daily. 90 tablet 3  .  metoCLOPramide (REGLAN) 5 MG tablet Take 1 tablet (5 mg total) by mouth 4 (four) times daily -  before meals and at bedtime. 100 tablet 0  . metoprolol tartrate (LOPRESSOR) 25 MG tablet TAKE 1 TABLET BY MOUTH TWICE DAILY 180 tablet 3  . Multiple Vitamin (MULTIVITAMIN WITH MINERALS) TABS tablet Take 1 tablet by mouth daily. 30 tablet 1  . OLANZapine zydis (ZYPREXA) 10 MG disintegrating tablet Take 1 tablet (10 mg total) by mouth at bedtime. 30 tablet 0  . ondansetron (ZOFRAN-ODT) 8 MG disintegrating tablet Take 1 tablet (8 mg total) by mouth every 8 (eight) hours as needed for nausea or vomiting. 30 tablet 1  . oxyCODONE (OXY IR/ROXICODONE) 5 MG immediate release tablet Take 1 tablet (5 mg total) by mouth every 6 (six) hours as needed for severe pain. 30 tablet 0  . potassium phosphate, monobasic, (K-PHOS ORIGINAL) 500 MG tablet Take 1 tablet (500 mg total) by mouth 4 (four) times daily -  with meals and at bedtime. 10 tablet 0  . predniSONE (DELTASONE) 10 MG tablet Take 40 mg oral daily for 2 weeks ( until 09/12/17) Then take 30 mg oral daily for next 2 weeks ( 09/13/17- 09/26/17) Then take  20 mg oral daily for next 2 weeks  09/27/17- 10/11/17) Then take 10 mg oral daily for next 2 weeks. 50 tablet 1  . PROAIR HFA 108 (90 Base) MCG/ACT inhaler INL 2 PFS PO Q 4 TO 6 H PRN  5  . promethazine (PHENERGAN) 25 MG suppository Place 1 suppository (25 mg total) rectally every 6 (six) hours as needed for nausea or vomiting. 12 each 5  . senna-docusate (SENOKOT-S) 8.6-50 MG tablet Take 1 tablet by mouth at bedtime as needed for mild constipation. 30 tablet 0  . STIOLTO RESPIMAT 2.5-2.5 MCG/ACT AERS INL 2 PFS PO QD  5  . sucralfate (CARAFATE) 1 g tablet Take 1 tablet (1 g total) by mouth 3 (three) times daily. 90 tablet 1  . thiamine 100 MG tablet Take 1 tablet (100 mg total) by mouth daily. 30 tablet 1  . vitamin C (VITAMIN C) 500 MG tablet Take 1 tablet (500 mg total) by mouth 2 (two) times daily. 60 tablet 1  . Vitamin D, Ergocalciferol, (DRISDOL) 50000 units CAPS capsule TK ONE C PO Q WEEK  5   No current facility-administered medications for this visit.    Facility-Administered Medications Ordered in Other Visits  Medication Dose Route Frequency Provider Last Rate Last Dose  . heparin lock flush 100 unit/mL  500 Units Intravenous Once Corcoran, Melissa C, MD      . heparin lock flush 100 unit/mL  500 Units Intravenous Once Corcoran, Melissa C, MD      . sodium chloride flush (NS) 0.9 % injection 10 mL  10 mL Intravenous PRN Lequita Asal, MD   10 mL at 09/10/17 0847    Review of Systems  Constitutional: Positive for malaise/fatigue. Negative for diaphoresis, fever and weight loss (weight up 5 pounds).       "I feel better. I am eating and gaining weight. I don't need that feeding tube I don't think".  HENT: Negative.  Negative for congestion, nosebleeds, sinus pain and sore throat.   Eyes: Negative.  Negative for double vision, photophobia, pain and discharge.  Respiratory: Negative for cough, hemoptysis, sputum production and shortness of breath.   Cardiovascular: Positive for  claudication (chronic; followed by cardiology for known PVD; no further planned interventions at this time).  Negative for chest pain, palpitations, orthopnea, leg swelling (bilateral ankle edema) and PND.  Gastrointestinal: Positive for nausea and vomiting. Negative for abdominal pain, blood in stool, constipation, diarrhea and melena.  Genitourinary: Negative for dysuria, frequency, hematuria and urgency.  Musculoskeletal: Positive for back pain (chronic), falls and joint pain (LEFT hip; Left shoulder pain s/p recent fall). Negative for myalgias.       BLE pain related to known PVD  Skin: Negative for itching and rash.       Healing areas of abrasion to RIGHT shoulder and RIGHT knee.   Neurological: Positive for dizziness, weakness (generalized) and headaches. Negative for tremors.  Endo/Heme/Allergies: Does not bruise/bleed easily.  Psychiatric/Behavioral: Negative for depression, memory loss, substance abuse and suicidal ideas. The patient is not nervous/anxious and does not have insomnia.   All other systems reviewed and are negative.  Performance status (ECOG): 2 - Symptomatic, <50% confined to bed  Physical Exam: There were no vitals taken for this visit. GENERAL:  Thin gentleman sitting comfortably in a wheelchair in the exam room in no acute distress.  He is examined in the chair. MENTAL STATUS:  Alert and oriented to person, place and time. HEAD:  Short hair.  Normocephalic, atraumatic, face symmetric, no Cushingoid features. EYES:  Brown eyes.  Pupils equal round and reactive to light and accomodation.  No conjunctivitis or scleral icterus. ENT:  Oropharynx clear without lesion.  Tongue normal. Mucous membranes moist.  RESPIRATORY:  Clear to auscultation without rales, wheezes or rhonchi. CARDIOVASCULAR:  Regular rate and rhythm without murmur, rub or gallop. ABDOMEN:  Scaphoid.  Soft, non-tender, with active bowel sounds, and no hepatosplenomegaly.  No masses. SKIN:  Well healed  trauma to right side.  No rashes, ulcers or lesions. EXTREMITIES: No edema, no skin discoloration or tenderness.  No palpable cords. LYMPH NODES: No palpable cervical, supraclavicular, axillary or inguinal adenopathy  NEUROLOGICAL: Unremarkable. PSYCH:  Appropriate.   GENERAL:  Well developed, well nourished, gentleman sitting comfortably in the exam room in no acute distress. MENTAL STATUS:  Alert and oriented to person, place and time. HEAD:  *** hair.  Normocephalic, atraumatic, face symmetric, no Cushingoid features. EYES:  *** eyes.  Pupils equal round and reactive to light and accomodation.  No conjunctivitis or scleral icterus. ENT:  Oropharynx clear without lesion.  Tongue normal. Mucous membranes moist.  RESPIRATORY:  Clear to auscultation without rales, wheezes or rhonchi. CARDIOVASCULAR:  Regular rate and rhythm without murmur, rub or gallop. ABDOMEN:  Soft, non-tender, with active bowel sounds, and no hepatosplenomegaly.  No masses. SKIN:  No rashes, ulcers or lesions. EXTREMITIES: No edema, no skin discoloration or tenderness.  No palpable cords. LYMPH NODES: No palpable cervical, supraclavicular, axillary or inguinal adenopathy  NEUROLOGICAL: Unremarkable. PSYCH:  Appropriate.   No visits with results within 3 Day(s) from this visit.  Latest known visit with results is:  Infusion on 10/03/2017  Component Date Value Ref Range Status  . Magnesium 10/03/2017 1.5* 1.7 - 2.4 mg/dL Final   Performed at [facility], [address], [place], [place] [address]  . Phosphorus 10/03/2017 4.6  2.5 - 4.6 mg/dL Final   Performed at [facility], [address] [place], [place] [address]  . Sodium 10/03/2017 132* 135 - 145 mmol/L Final  . Potassium 10/03/2017 4.4  3.5 - 5.1 mmol/L Final  . Chloride 10/03/2017 100* 101 - 111 mmol/L Final  . CO2 10/03/2017 25  22 - 32 mmol/L Final  . Glucose, Bld 10/03/2017  80  65 - 99 mg/dL Final  . BUN 10/03/2017 12  6 - 20 mg/dL  Final  . Creatinine, Ser 10/03/2017 0.57* 0.61 - 1.24 mg/dL Final  . Calcium 10/03/2017 9.3  8.9 - 10.3 mg/dL Final  . GFR calc non Af Amer 10/03/2017 >60  >60 mL/min Final  . GFR calc Af Amer 10/03/2017 >60  >60 mL/min Final   Comment: (NOTE) The eGFR has been calculated using the CKD EPI equation. This calculation has not been validated in all clinical situations. eGFR's persistently <60 mL/min signify possible Chronic Kidney Disease.   Georgiann Hahn gap 10/03/2017 7  5 - 15 Final   Performed at [facility], [address] [place], [place] [address]    Assessment:  MAKELL CYR is a 57 y.o. male with extensive stage small cell lung cancer.  He presented with a 2 year history of night sweats and a 36 pound weight loss in 6 months.  Right supraclavicular biopsy on 07/19/2016 revealed small cell carcinoma of the lung.  CEA was 1.7 on 07/31/2016.  Chest CT on 06/28/2016 revealed extensive adenopathy.  There were multiple lymph nodes descending thoracic aorta, largest measuring 2.1 x 1.7 cm.  There was adenopathy in the aortopulmonary window with the largest confluence of lymph nodes measuring 4 x 2.6 cm. There was hilar adenopathy on the left measuring 2 x 1.9 cm. There were multiple enlarged lymph nodes in the pretracheal region, largest 1.7 x 1.7 cm.  There were lymph nodes to the left of the carina measuring 2.8 x 1.9 cm. There was a subcarinal lymph node measuring 2.4 x 1.6 cm. There was a 4 x 3 mm nodular opacity in the anterior segment of the left upper lobe.    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  There was nodal metastasis within the chest and supraclavicular/low jugular regions.  There was multifocal osseous metastasis.  There was possibly a pathologic fracture involving the posteromedial right tenth rib.  There was new left lower lobe opacity with mild hypermetabolism, suspicious for infection or postobstructive  pneumonitis.  He completed a course of Levaquin for apost-obstructive pneumonia.  He experienced transient positional left facial burning.  Head MRI on 07/26/2016 revealed no evidence of brain metastasis. There was punctate DWI hyperintensity along a high right frontal gyrus, possible recent infarct.  There was a small occipital bone metastasis.  Head MRA on 07/27/2016 revealed normal large and medium sized vessels.  Bilateral carotid duplex on 07/27/2016 revealed <50% stenosis in the right and left internal carotis arteries.  Echo on 07/27/2016 revealed an EF of 45-50% without cardiac source of emboli.  He received 4 cycles of carboplatin and etoposide (07/31/2016 - 10/02/2016) with OnPro Neulasta support.  Platelet nadir was 40,000 on day 16 of cycle #2 and 23,000 on day 15 of cycle #3.  He received 2 units of PRBC with cycle #3 and 1 unit with cycle #4.  He received Xgeva on 08/13/2016 (last 10/30/2016).  Chest CT on 09/11/2016 revealed a marked response to therapy.  There was near complete resolution of left suprahilar mass and thoracic adenopathy.  There was interval sclerosis indicative of healing site of osseous metastasis.  Thoracic spine MRI on 10/22/2016 reveals sclerotic enhancing lesion at T7 vertebral body on the left compatible with metastasis. There was a healing fracture the right medial 10th rib.  PET scan on 11/12/2016 revealed a new 10 mm hypermetabolic left axillary lymph node (SUV 3.6) due  to a left hand abscess.  There were no other sites of metabolically active disease.  He completed a course of thoracic spine radiation (3000 cGy in 10 fractions) on 01/11/2017.  He received PCI (3000 cGy in 15 fractions) from 01/28/2017 - 02/15/2017.  Chest, abdomen, and pelvic CT on 03/18/2017 revealed new areas of irregular ground-glass in the medial aspects of the right upper and right lower lobes as well as somewhat amorphous ground-glass in the lingula and left lower lobe. Time interval  favored infectious or inflammatory lesions.  Osseous metastatic disease was stable.  There was resolved or resected left axillary lymph node.  Bone scan on 04/10/2017 revealed multiple foci of skeletal metastasis within the ribs, pelvis, and RIGHT clavicle.  Multiple areas of activity corresponding to sclerotic lesions on CT and non-hypermetabolic lesions on PET scan.  PET scan on 04/26/2017 revealed bandlike densities medially in the right upper lobe and medially in the right lower lobe were new from 11/12/2016 but were present on 03/18/2017, and had faint hypermetabolic activity. Radiation pneumonitis could have this appearance.  The morphology would be unusual for malignancy but surveillance was warranted.  There were scattered stable sclerotic bony lesions that were not hypermetabolic.  There was persistent accentuated activity in the vicinity of the anus may be physiologic but was technically nonspecific.  LEFT hip radiograph on 09/02/2017 revealed degenerative changes in the bilateral hip joints, with the left being more significant than the right. There was no mention of new osseous lytic lesions.  Bone scan on 10/02/2017 revealed multiple sites (sternum, manubrium, LEFT scapula, BILATERAL anterior and posterior ribs, RIGHT L3 vertebral body, superior LEFT SI joint, and anterior LEFT iliac bone) of abnormal osseous tracer accumulation consistent with osseous metastatic disease, with 2 sites of subtle new uptake in the anterior RIGHT approximately fourth rib.  He fell on his right side prior to imaging.  He has chemotherapy induced anemia.  Anemia work-up on 09/11/2016 revealed the following normal labs:  Ferritin (219), iron sat (28%), TIBC (227), B12 (3055), and folate (6.8).  He has folate deficiency.  Folate was 5.9 on 08/27/2017.  B12 was 542 on 08/27/2017.  He has a history of left lower extremity DVT a few months after his cardiac surgery in 08/2014.  He has peripheral vascular disease s/p  stent placement in 03/2015.  He is on a Plavix.  He has not had a colonoscopy in 30 years.  He was ineligible for the AbbVie clinical trial ([id number]) secondary to his left hand abscess.  He underwent surgical debridement of a left hand abscess on 11/15/2016.  Wound cultures grew MRSA.  He was treated with emperic IV Vancomycin then Bactrim.   He was admitted to [facility] from 08/26/2017 - 08/30/2017 with nausea, vomiting, dehydration and electrolyte abnormalities.  Head MRI without contrast on 08/29/2017 revealed no evidence of metastatic disease. Prior UGI revealed no evidence of obstruction or gastroparesis.  Labs revealed no evidence of pancreatitis.  He was started on scheduled antiemetics and PPI BID.  AM cortisol on 08/28/2017 was low.  He was diagnosed with adrenal insufficiency.  He was started on prednisone 40 mg a day.  He was able to advance his diet.  Tube feeds were avoided.  Symptomatically, patient is improving. He notes that his pain is "better" with the use of Roxicodone 5 mg q6 hours. Patient continues to have generalized weakness. He has sustained another mechanical fall, with resulting LEFT shoulder pain, since his last visit. Patient is eatign better. His weight  is up 5 pounds. He refused PEG tube placement. Exam is unremarkable. Magnesium is 1.5.  Plan: 1. Labs today: CBC with diff, BMP, Mg, folate, thiamine, vitamin C.   2. Discuss interval bone scan - multiple osseous lytic lesions observed. It is unknown if these ares are active at this point.   Discuss plan for PET scan to assess metabolic activity of osseous lesions that were previously not hypermetabolic. New areas have declared since his last bone scan that warrant further assessment for active metastatic disease via PET imaging. History is notable for a fall to the right side prior to his bone scan.  Discussed that active disease confirmed, we will need to plan on reinitiating systemic chemotherapy and Xgeva. We are currently  awaiting on insurance approval of desired study. Patient verbalizes understanding and agreement.  3. Discuss GI consult with Dr. Allen Norris and patient's decision to postpone PEG tube placement.   Patient gaining weight, however severe protein calorie malnutrition remains an issue. Patient has an underweight BMI of 16.3 .  Patient encouraged to increase intake of protein and calorie dense foods.  Will have him follow up with RD today in infusion for further discussion and nutritional planning.  4. Discuss weakness and falls. Will schedule MRI of the brain with and without contrast to further assess for brain metastasis given his SCLC diagnosis.  5. Discuss HYPOnatremia and HYPOmagnesemia. Sodium 132 (improved) and magnesium 1.5. Patient will need 2 g intravenous Mg today.   6. Discuss reflux. Current medication (famotidine) is not working. Asking to go back on pantoprazole. There is a drug-drug interaction between pantoprazole and the clopidogrel that patient is taking. Will continue famotine 20 mg BID. Will add Carafate 1 g TID.  7. Discuss symptom management.  Patient has antiemetics and pain medications at home to use on a PRN basis. Patient  advising that the  prescribed interventions are adequate at this point. Continue all medications as previously prescribed.   Discussed need to keep scheduled appointments with pain clinic to establish care and management of his chronic pain condition.   He has been referred to several pain management clinics. Refused to see Dr. Dossie Arbour.   Patient has scheduled appointments in 2 different clinics at this time. He is unsure which provider he will see. Appointment with Dr. Nathanial Rancher scheduled on 10/28/2017 @ 0900, and with Dr. Gillis Santa on 10/31/2017 @ 1400.    Dr. Mike Gip willing to provide short term pain management for this patient.  8. RTC in 1 week for MD assessment, labs (CBC with diff, BMP, Mg, folate, thiamine, vitamin C), +/- IV Mg, and review of  scans.    Lequita Asal, MD  10/10/2017, 4:36 AM   I saw and evaluated the patient, participating in the key portions of the service and reviewing pertinent diagnostic studies and records.  I reviewed the nurse practitioner's note and agree with the findings and the plan.  The assessment and plan were discussed with the patient.  Additional diagnostic studies of PET are needed to clarify disease status and would change the clinical management.  Multiple questions were asked by the patient and answered.   Nolon Stalls, MD 10/10/2017,4:36 AM

## 2017-10-11 ENCOUNTER — Other Ambulatory Visit: Payer: Self-pay | Admitting: *Deleted

## 2017-10-11 DIAGNOSIS — F1190 Opioid use, unspecified, uncomplicated: Secondary | ICD-10-CM

## 2017-10-16 ENCOUNTER — Other Ambulatory Visit: Payer: Self-pay | Admitting: Radiation Oncology

## 2017-10-16 ENCOUNTER — Ambulatory Visit
Admission: RE | Admit: 2017-10-16 | Discharge: 2017-10-16 | Disposition: A | Discharge status: Home / Self Care | Payer: Medicaid Other | Source: Ambulatory Visit | Attending: Urgent Care | Admitting: Urgent Care

## 2017-10-16 ENCOUNTER — Other Ambulatory Visit: Payer: Self-pay | Admitting: *Deleted

## 2017-10-16 DIAGNOSIS — R296 Repeated falls: Secondary | ICD-10-CM | POA: Diagnosis not present

## 2017-10-16 DIAGNOSIS — C3490 Malignant neoplasm of unspecified part of unspecified bronchus or lung: Secondary | ICD-10-CM | POA: Diagnosis present

## 2017-10-16 MED ORDER — GADOBENATE DIMEGLUMINE 529 MG/ML IV SOLN
10.0000 mL | Freq: Once | INTRAVENOUS | Status: AC | PRN
  Administered 2017-10-16: 10 mL via INTRAVENOUS

## 2017-10-18 ENCOUNTER — Inpatient Hospital Stay: Payer: Medicaid Other

## 2017-10-18 ENCOUNTER — Inpatient Hospital Stay: Payer: Medicaid Other | Attending: Hematology and Oncology

## 2017-10-18 ENCOUNTER — Other Ambulatory Visit: Payer: Self-pay

## 2017-10-18 ENCOUNTER — Ambulatory Visit
Admission: RE | Admit: 2017-10-18 | Discharge: 2017-10-18 | Disposition: A | Discharge status: Home / Self Care | Payer: Medicaid Other | Source: Ambulatory Visit | Attending: Urgent Care | Admitting: Urgent Care

## 2017-10-18 ENCOUNTER — Inpatient Hospital Stay (HOSPITAL_BASED_OUTPATIENT_CLINIC_OR_DEPARTMENT_OTHER): Payer: Medicaid Other | Admitting: Hematology and Oncology

## 2017-10-18 ENCOUNTER — Other Ambulatory Visit: Payer: Self-pay | Admitting: Hematology and Oncology

## 2017-10-18 VITALS — BP 113/73 | HR 106 | Temp 98.1°F | Resp 18 | Wt 118.4 lb

## 2017-10-18 DIAGNOSIS — E2740 Unspecified adrenocortical insufficiency: Secondary | ICD-10-CM | POA: Diagnosis not present

## 2017-10-18 DIAGNOSIS — R918 Other nonspecific abnormal finding of lung field: Secondary | ICD-10-CM | POA: Insufficient documentation

## 2017-10-18 DIAGNOSIS — C3490 Malignant neoplasm of unspecified part of unspecified bronchus or lung: Secondary | ICD-10-CM | POA: Insufficient documentation

## 2017-10-18 DIAGNOSIS — F1190 Opioid use, unspecified, uncomplicated: Secondary | ICD-10-CM

## 2017-10-18 DIAGNOSIS — C7951 Secondary malignant neoplasm of bone: Secondary | ICD-10-CM

## 2017-10-18 DIAGNOSIS — C7931 Secondary malignant neoplasm of brain: Secondary | ICD-10-CM | POA: Diagnosis not present

## 2017-10-18 DIAGNOSIS — E871 Hypo-osmolality and hyponatremia: Secondary | ICD-10-CM | POA: Diagnosis not present

## 2017-10-18 DIAGNOSIS — D6481 Anemia due to antineoplastic chemotherapy: Secondary | ICD-10-CM | POA: Insufficient documentation

## 2017-10-18 DIAGNOSIS — R5381 Other malaise: Secondary | ICD-10-CM | POA: Diagnosis not present

## 2017-10-18 DIAGNOSIS — M546 Pain in thoracic spine: Secondary | ICD-10-CM

## 2017-10-18 DIAGNOSIS — R9082 White matter disease, unspecified: Secondary | ICD-10-CM | POA: Insufficient documentation

## 2017-10-18 DIAGNOSIS — E538 Deficiency of other specified B group vitamins: Secondary | ICD-10-CM

## 2017-10-18 DIAGNOSIS — I252 Old myocardial infarction: Secondary | ICD-10-CM | POA: Insufficient documentation

## 2017-10-18 DIAGNOSIS — Z7902 Long term (current) use of antithrombotics/antiplatelets: Secondary | ICD-10-CM | POA: Insufficient documentation

## 2017-10-18 DIAGNOSIS — I509 Heart failure, unspecified: Secondary | ICD-10-CM | POA: Insufficient documentation

## 2017-10-18 DIAGNOSIS — C3412 Malignant neoplasm of upper lobe, left bronchus or lung: Secondary | ICD-10-CM | POA: Diagnosis not present

## 2017-10-18 DIAGNOSIS — I2510 Atherosclerotic heart disease of native coronary artery without angina pectoris: Secondary | ICD-10-CM | POA: Diagnosis not present

## 2017-10-18 DIAGNOSIS — K8020 Calculus of gallbladder without cholecystitis without obstruction: Secondary | ICD-10-CM | POA: Insufficient documentation

## 2017-10-18 DIAGNOSIS — G893 Neoplasm related pain (acute) (chronic): Secondary | ICD-10-CM

## 2017-10-18 DIAGNOSIS — R531 Weakness: Secondary | ICD-10-CM | POA: Diagnosis not present

## 2017-10-18 DIAGNOSIS — R110 Nausea: Secondary | ICD-10-CM | POA: Insufficient documentation

## 2017-10-18 DIAGNOSIS — I700 Atherosclerosis of aorta: Secondary | ICD-10-CM | POA: Insufficient documentation

## 2017-10-18 DIAGNOSIS — E43 Unspecified severe protein-calorie malnutrition: Secondary | ICD-10-CM | POA: Insufficient documentation

## 2017-10-18 DIAGNOSIS — M549 Dorsalgia, unspecified: Secondary | ICD-10-CM | POA: Insufficient documentation

## 2017-10-18 DIAGNOSIS — F17210 Nicotine dependence, cigarettes, uncomplicated: Secondary | ICD-10-CM | POA: Insufficient documentation

## 2017-10-18 DIAGNOSIS — R634 Abnormal weight loss: Secondary | ICD-10-CM

## 2017-10-18 DIAGNOSIS — I739 Peripheral vascular disease, unspecified: Secondary | ICD-10-CM | POA: Insufficient documentation

## 2017-10-18 DIAGNOSIS — M25512 Pain in left shoulder: Secondary | ICD-10-CM | POA: Insufficient documentation

## 2017-10-18 DIAGNOSIS — R5383 Other fatigue: Secondary | ICD-10-CM | POA: Insufficient documentation

## 2017-10-18 DIAGNOSIS — I119 Hypertensive heart disease without heart failure: Secondary | ICD-10-CM | POA: Insufficient documentation

## 2017-10-18 DIAGNOSIS — Z9181 History of falling: Secondary | ICD-10-CM | POA: Diagnosis not present

## 2017-10-18 DIAGNOSIS — T451X5S Adverse effect of antineoplastic and immunosuppressive drugs, sequela: Secondary | ICD-10-CM | POA: Diagnosis not present

## 2017-10-18 DIAGNOSIS — Z7952 Long term (current) use of systemic steroids: Secondary | ICD-10-CM | POA: Insufficient documentation

## 2017-10-18 DIAGNOSIS — Z951 Presence of aortocoronary bypass graft: Secondary | ICD-10-CM | POA: Insufficient documentation

## 2017-10-18 DIAGNOSIS — Z7189 Other specified counseling: Secondary | ICD-10-CM

## 2017-10-18 DIAGNOSIS — Z79899 Other long term (current) drug therapy: Secondary | ICD-10-CM | POA: Insufficient documentation

## 2017-10-18 DIAGNOSIS — E785 Hyperlipidemia, unspecified: Secondary | ICD-10-CM | POA: Diagnosis not present

## 2017-10-18 DIAGNOSIS — Z681 Body mass index (BMI) 19 or less, adult: Secondary | ICD-10-CM

## 2017-10-18 LAB — CBC WITH DIFFERENTIAL/PLATELET
Basophils Absolute: 0.1 10*3/uL (ref 0–0.1)
Basophils Relative: 1 %
Eosinophils Absolute: 0 10*3/uL (ref 0–0.7)
Eosinophils Relative: 0 %
HCT: 30.1 % — ABNORMAL LOW (ref 40.0–52.0)
Hemoglobin: 10.9 g/dL — ABNORMAL LOW (ref 13.0–18.0)
Lymphocytes Relative: 13 %
Lymphs Abs: 0.8 10*3/uL — ABNORMAL LOW (ref 1.0–3.6)
MCH: 37.4 pg — ABNORMAL HIGH (ref 26.0–34.0)
MCHC: 36.2 g/dL — ABNORMAL HIGH (ref 32.0–36.0)
MCV: 103.5 fL — ABNORMAL HIGH (ref 80.0–100.0)
Monocytes Absolute: 0.4 10*3/uL (ref 0.2–1.0)
Monocytes Relative: 7 %
Neutro Abs: 4.9 10*3/uL (ref 1.4–6.5)
Neutrophils Relative %: 79 %
Platelets: 224 10*3/uL (ref 150–440)
RBC: 2.91 MIL/uL — ABNORMAL LOW (ref 4.40–5.90)
RDW: 14.4 % (ref 11.5–14.5)
WBC: 6.2 10*3/uL (ref 3.8–10.6)

## 2017-10-18 LAB — URINE DRUG SCREEN, QUALITATIVE (ARMC ONLY)
Amphetamines, Ur Screen: NOT DETECTED
Benzodiazepine, Ur Scrn: NOT DETECTED
Cannabinoid 50 Ng, Ur ~~LOC~~: POSITIVE — AB
Cocaine Metabolite,Ur ~~LOC~~: NOT DETECTED
MDMA (Ecstasy)Ur Screen: NOT DETECTED
Methadone Scn, Ur: NOT DETECTED
Opiate, Ur Screen: NOT DETECTED
Phencyclidine (PCP) Ur S: NOT DETECTED
Tricyclic, Ur Screen: NOT DETECTED

## 2017-10-18 LAB — BASIC METABOLIC PANEL
Anion gap: 8 (ref 5–15)
BUN: 5 mg/dL — ABNORMAL LOW (ref 6–20)
CO2: 22 mmol/L (ref 22–32)
Calcium: 8 mg/dL — ABNORMAL LOW (ref 8.9–10.3)
Chloride: 98 mmol/L (ref 98–111)
Creatinine, Ser: 0.59 mg/dL — ABNORMAL LOW (ref 0.61–1.24)
GFR calc Af Amer: >60 mL/min (ref >60)
GFR calc non Af Amer: >60 mL/min (ref >60)
Glucose, Bld: 123 mg/dL — ABNORMAL HIGH (ref 70–99)
Potassium: 3.6 mmol/L (ref 3.5–5.1)
Sodium: 128 mmol/L — ABNORMAL LOW (ref 135–145)

## 2017-10-18 LAB — FOLATE: Folate: 17 ng/mL (ref >5.9)

## 2017-10-18 LAB — GLUCOSE, CAPILLARY: GLUCOSE-CAPILLARY: 70 mg/dL (ref 70–99)

## 2017-10-18 LAB — MAGNESIUM: Magnesium: 1.6 mg/dL — ABNORMAL LOW (ref 1.7–2.4)

## 2017-10-18 MED ORDER — FLUDEOXYGLUCOSE F - 18 (FDG) INJECTION
6.6000 | Freq: Once | INTRAVENOUS | Status: AC | PRN
  Administered 2017-10-18: 6.6 via INTRAVENOUS

## 2017-10-18 MED ORDER — SODIUM CHLORIDE 0.9 % IV SOLN
1.0000 g | Freq: Once | INTRAVENOUS | Status: AC
  Administered 2017-10-18: 1 g via INTRAVENOUS
  Filled 2017-10-18: qty 2

## 2017-10-18 MED ORDER — OXYCODONE HCL 5 MG PO TABS: 5.0000 mg | ORAL_TABLET | Freq: Four times a day (QID) | ORAL | 0 refills | Status: DC | PRN

## 2017-10-18 MED ORDER — SODIUM CHLORIDE 0.9% FLUSH
10.0000 mL | Freq: Once | INTRAVENOUS | Status: AC
  Administered 2017-10-18: 10 mL via INTRAVENOUS
  Filled 2017-10-18: qty 10

## 2017-10-18 MED ORDER — DEXAMETHASONE 4 MG PO TABS: 4.0000 mg | ORAL_TABLET | Freq: Every day | ORAL | 0 refills | Status: DC

## 2017-10-18 MED ORDER — HEPARIN SOD (PORK) LOCK FLUSH 100 UNIT/ML IV SOLN
500.0000 [IU] | Freq: Once | INTRAVENOUS | Status: AC
  Administered 2017-10-18: 500 [IU] via INTRAVENOUS
  Filled 2017-10-18: qty 5

## 2017-10-18 MED ORDER — SODIUM CHLORIDE 0.9 % IV SOLN
Freq: Once | INTRAVENOUS | Status: AC
  Administered 2017-10-18: 13:00:00 via INTRAVENOUS
  Filled 2017-10-18: qty 1000

## 2017-10-18 NOTE — Progress Notes (Signed)
Philip [name] is a 57 y.o. male with extensive stage small cell lung cancer who is seen for review of interval imaging and discussion regarding direction of therapy.  HPI:  The patient was last seen in the medical oncology clinic on 10/03/2017.  At that time, he was improving.  His pain was "better" with the use of Roxicodone 5 mg q6 hours. Patient continued to have generalized weakness. He had sustained another mechanical fall, with resulting LEFT shoulder pain, since his last visit. Patient was eatign better. PEG tube had been postponed.  His weight was up 5 pounds.  Exam was unremarkable. Magnesium was1.5.  We discussed his bone scan at last visit.  A PET scan was ordered to assess whether the bone lesions were active (last PET negative).  Head MRI with contrast was ordered.  He met with nutrition.  He received IV magnesium.  Head MRI with contrast on 10/16/2017 revealed a solitary metastatic lesion of the superior right frontal cortex, adjacent to the falx cerebri measuring 6 x 7 mm. There was no edema or mass effect.  There was progression of now severe white matter disease, mildly worse compared to 08/28/2017 but significantly worse from 07/26/2016.  PET scan on 10/18/2017 revealed a new 8 mm nodular density within the anteromedial left upper lobe which exhibits mild to moderate increased uptake suspicious for metastatic disease (SUV 2.4).  There was no additional foci of abnormal FDG uptake identified.  There were scattered sclerotic bone lesions without corresponding FDG uptake to suggest metabolically active bone metastases.  During the interim, he has felt "ok, I guess".  He notes that his hips and knees "lock up at night" and wake him up.  He only has a little nausea.  He is eating better with his pain controlled.  He is taking 4 oxycodone a day.  He brought in a pain dairy today.   Past  Medical History:  Diagnosis Date  . Anemia   . CAD (coronary artery disease)    a. 08/2014 Inf STEMI/CABG x 3 (LIMA->LAD, VG->Diag, RIMA->RCA).  . DVT, recurrent, lower extremity, acute, left ([place]) 2016  . GIB (gastrointestinal bleeding)    a. In the setting of DAPT-->tolerating plavix only.  . Hyperlipidemia   . Hypertensive heart disease   . Lung cancer ([place])    a. s/p chemo/radiation.  Marland Kitchen PAD (peripheral artery disease) ([place])    a. 03/2015 Periph Angio: short occlusion of L pop w/ evidence of embolization into the DP->5.0x50 mm Inova self-expanding stent;  b. 04/08/2015 ABI: R 1.12, L 0.99; c. 01/2017 LE Duplex: bilat SFA obstructive dzs, patent L pop.  . Tobacco abuse   . Toe amputation status, left [facility]) 07/09/2016   2017    Past Surgical History:  Procedure Laterality Date  . CARDIAC CATHETERIZATION N/A 08/24/2014   Procedure: Left Heart Cath and Coronary Angiography;  Surgeon: Isaias Cowman, MD;  Location: [place] CV LAB;  Service: Cardiovascular;  Laterality: N/A;  . CARDIAC CATHETERIZATION N/A 08/24/2014   Procedure: Coronary Stent Intervention;  Surgeon: Isaias Cowman, MD;  Location: [place] CV LAB;  Service: Cardiovascular;  Laterality: N/A;  . CORONARY ARTERY BYPASS GRAFT N/A 08/25/2014   Procedure: CORONARY ARTERY BYPASS GRAFTING (CABG), ON PUMP, TIMES THREE, USING BILATERAL MAMMARY ARTERIES, RIGHT GREATER SAPHENOUS VEIN HARVESTED ENDOSCOPICALLY;  Surgeon: Melrose Nakayama, MD;  Location: [place];  Service: Open Heart Surgery;  Laterality:  N/A;  . ESOPHAGOGASTRODUODENOSCOPY (EGD) WITH PROPOFOL N/A 08/08/2017   Procedure: ESOPHAGOGASTRODUODENOSCOPY (EGD) WITH PROPOFOL;  Surgeon: Jonathon B[place]s, MD;  Location: [facility] ENDOSCOPY;  Service: Gastroenterology;  Laterality: N/A;  . INCISION AND DRAINAGE ABSCESS Left 11/15/2016   Procedure: INCISION AND DRAINAGE ABSCESS OF LEFT WRIST;  Surgeon: Roseanne Kaufman, MD;  Location: [place];  Service: Orthopedics;  Laterality:  Left;  . PEG PLACEMENT N/A 10/01/2017   Procedure: PERCUTANEOUS ENDOSCOPIC GASTROSTOMY (PEG) PLACEMENT;  Surgeon: Lucilla Lame, MD;  Location: [facility] ENDOSCOPY;  Service: Endoscopy;  Laterality: N/A;  . PERIPHERAL VASCULAR CATHETERIZATION N/A 03/30/2015   Procedure: Abdominal Aortogram w/Lower Extremity;  Surgeon: Wellington Hampshire, MD;  Location: [place] CV LAB;  Service: Cardiovascular;  Laterality: N/A;  . PORTA CATH INSERTION N/A 07/27/2016   Procedure: Glori Luis Cath Insertion;  Surgeon: Algernon Huxley, MD;  Location: [place] CV LAB;  Service: Cardiovascular;  Laterality: N/A;  . TEE WITHOUT CARDIOVERSION N/A 08/25/2014   Procedure: TRANSESOPHAGEAL ECHOCARDIOGRAM (TEE);  Surgeon: Melrose Nakayama, MD;  Location: [place];  Service: Open Heart Surgery;  Laterality: N/A;    Family History  Problem Relation Age of Onset  . Hypertension Father   . Lung cancer Father   . Heart attack Mother 42       STENT  . Hyperlipidemia Mother   . Heart attack Brother   . Heart attack Brother   . Breast cancer Sister   . Heart attack Sister   . Colon cancer Paternal Grandfather     Social History:  reports that he has been smoking cigarettes.  He has been smoking about 0.25 packs per day. He has never used smokeless tobacco. He reports that he drinks alcohol. He reports that he does not use drugs.  The patient smokes 1/2 pack of cigarettes/day (previously 2 1/2 ppd).  He started smoking in his teens.  He denies any exposure to radiation or toxins.  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  He lives in [place].  His wife is named Publishing copy.  He is accompanied by his wife today.  Allergies:  Allergies  Allergen Reactions  . Tylenol [Acetaminophen] Palpitations  . Amitriptyline Other (See Comments)    Burning sensation all over  . Aspirin   . Contrast Media [Iodinated Diagnostic Agents] Other (See Comments)    Pt states that he got "knots behind his ears"  . Nsaids Other (See Comments)     Blood in stools  . Ibuprofen Other (See Comments) and Nausea Only    Blood in stools  . Tape Rash and Other (See Comments)    PLEASE USE COBAN WRAP; THE PATIENT'S SKIN TEARS WHEN BANDAGES ARE REMOVED!!    Current Medications: Current Outpatient Medications  Medication Sig Dispense Refill  . atorvastatin (LIPITOR) 40 MG tablet TAKE 1 TABLET BY MOUTH DAILY 90 tablet 3  . Calcium Carb-Cholecalciferol (CALCIUM + D3 PO) Take 1 tablet by mouth 3 (three) times daily.    . clopidogrel (PLAVIX) 75 MG tablet Take 1 tablet (75 mg total) by mouth daily. 90 tablet 3  . clotrimazole (MYCELEX) 10 MG troche Take 1 lozenge (10 mg total) by mouth 4 (four) times daily. 35 lozenge 0  . diphenhydrAMINE (BENADRYL) 25 mg capsule Take 25 mg by mouth 2 (two) times daily as needed for allergies.     . famotidine (PEPCID) 20 MG tablet Take 1 tablet (20 mg total) by mouth 2 (two) times daily. 60 tablet 0  .  feeding supplement, ENSURE ENLIVE, (ENSURE ENLIVE) LIQD Take 237 mLs by mouth 3 (three) times daily between meals. (Patient taking differently: Take 237 mLs by mouth 2 (two) times daily between meals. ) 237 mL 12  . FLOVENT HFA 220 MCG/ACT inhaler INL 2 PFS PO BID  5  . fluconazole (DIFLUCAN) 100 MG tablet Take 1 tablet by mouth daily.  3  . folic acid (FOLVITE) 1 MG tablet Take 1 tablet (1 mg total) by mouth daily. 30 tablet 0  . hydrochlorothiazide (HYDRODIURIL) 12.5 MG tablet Take 1 tablet by mouth daily.  2  . lidocaine-prilocaine (EMLA) cream Apply to affected area once 30 g 3  . magnesium oxide (MAG-OX) 400 (241.3 Mg) MG tablet Take 1 tablet (400 mg total) by mouth 3 (three) times daily. 90 tablet 3  . metoCLOPramide (REGLAN) 5 MG tablet Take 1 tablet (5 mg total) by mouth 4 (four) times daily -  before meals and at bedtime. 100 tablet 0  . metoprolol tartrate (LOPRESSOR) 25 MG tablet TAKE 1 TABLET BY MOUTH TWICE DAILY 180 tablet 3  . Multiple Vitamin (MULTIVITAMIN WITH MINERALS) TABS tablet Take 1 tablet  by mouth daily. 30 tablet 1  . OLANZapine zydis (ZYPREXA) 10 MG disintegrating tablet Take 1 tablet (10 mg total) by mouth at bedtime. 30 tablet 0  . ondansetron (ZOFRAN-ODT) 8 MG disintegrating tablet Take 1 tablet (8 mg total) by mouth every 8 (eight) hours as needed for nausea or vomiting. 30 tablet 1  . oxyCODONE (OXY IR/ROXICODONE) 5 MG immediate release tablet Take 1 tablet (5 mg total) by mouth every 6 (six) hours as needed for severe pain. 30 tablet 0  . potassium phosphate, monobasic, (K-PHOS ORIGINAL) 500 MG tablet Take 1 tablet (500 mg total) by mouth 4 (four) times daily -  with meals and at bedtime. 10 tablet 0  . predniSONE (DELTASONE) 10 MG tablet Take 40 mg oral daily for 2 weeks ( until 09/12/17) Then take 30 mg oral daily for next 2 weeks ( 09/13/17- 09/26/17) Then take 20 mg oral daily for next 2 weeks  09/27/17- 10/11/17) Then take 10 mg oral daily for next 2 weeks. 50 tablet 1  . PROAIR HFA 108 (90 Base) MCG/ACT inhaler INL 2 PFS PO Q 4 TO 6 H PRN  5  . promethazine (PHENERGAN) 25 MG suppository Place 1 suppository (25 mg total) rectally every 6 (six) hours as needed for nausea or vomiting. 12 each 5  . senna-docusate (SENOKOT-S) 8.6-50 MG tablet Take 1 tablet by mouth at bedtime as needed for mild constipation. 30 tablet 0  . STIOLTO RESPIMAT 2.5-2.5 MCG/ACT AERS INL 2 PFS PO QD  5  . sucralfate (CARAFATE) 1 g tablet Take 1 tablet (1 g total) by mouth 3 (three) times daily. 90 tablet 1  . thiamine 100 MG tablet Take 1 tablet (100 mg total) by mouth daily. 30 tablet 1  . vitamin C (VITAMIN C) 500 MG tablet Take 1 tablet (500 mg total) by mouth 2 (two) times daily. 60 tablet 1  . Vitamin D, Ergocalciferol, (DRISDOL) 50000 units CAPS capsule TK ONE C PO Q WEEK  5  . bisacodyl (DULCOLAX) 5 MG EC tablet Take 1 tablet (5 mg total) by mouth daily as needed for moderate constipation. (Patient not taking: Reported on 10/03/2017) 30 tablet 0  . dexamethasone (DECADRON) 4 MG tablet Take 1 tablet  (4 mg total) by mouth daily. 20 tablet 0   No current facility-administered medications for this visit.  Facility-Administered Medications Ordered in Other Visits  Medication Dose Route Frequency Provider Last Rate Last Dose  . heparin lock flush 100 unit/mL  500 Units Intravenous Once Corcoran, Melissa C, MD      . heparin lock flush 100 unit/mL  500 Units Intravenous Once Corcoran, Melissa C, MD      . sodium chloride flush (NS) 0.9 % injection 10 mL  10 mL Intravenous PRN Lequita Asal, MD   10 mL at 09/10/17 0847    Review of Systems  Constitutional: Positive for malaise/fatigue. Negative for chills, diaphoresis, fever and weight loss (weight up 1 pound).       "I feel ok, I guess".  HENT: Negative.  Negative for congestion, ear discharge, ear pain, hearing loss, nosebleeds, sinus pain and sore throat.   Eyes: Negative.  Negative for blurred vision, double vision, photophobia, pain, discharge and redness.  Respiratory: Negative.  Negative for cough, hemoptysis, sputum production, shortness of breath, wheezing and stridor.   Cardiovascular: Positive for claudication (chronic; followed by cardiology for known PVD). Negative for chest pain, palpitations, orthopnea, leg swelling (bilateral ankle edema) and PND.  Gastrointestinal: Positive for nausea (little). Negative for abdominal pain, blood in stool, constipation, diarrhea, heartburn, melena and vomiting.  Genitourinary: Negative.  Negative for dysuria, frequency, hematuria and urgency.  Musculoskeletal: Positive for joint pain (hip and knees lock up). Negative for back pain (chronic), falls and myalgias.       BLE pain related to known PVD  Skin: Negative for itching and rash.  Neurological: Positive for weakness (generalized). Negative for dizziness, tingling, tremors, sensory change, speech change, focal weakness, seizures, loss of consciousness and headaches.  Psychiatric/Behavioral: Negative.  Negative for depression, memory  loss, substance abuse and suicidal ideas. The patient is not nervous/anxious and does not have insomnia.   All other systems reviewed and are negative.  Performance status (ECOG): 2 - Symptomatic, <50% confined to bed  Physical Exam: Blood pressure 113/73, pulse (!) 106, temperature 98.1 F (36.7 C), temperature source Tympanic, resp. rate 18, weight 118 lb 6.4 oz (53.7 kg), SpO2 100 %. GENERAL:  Thin gentleman sitting comfortably in a wheelchair in the exam room in no acute distress. MENTAL STATUS:  Alert and oriented to person, place and time. HEAD:  Short hair.  Normocephalic, atraumatic, face symmetric, no Cushingoid features. EYES:  Borwn eyes.  Pupils equal round and reactive to light and accomodation.  No conjunctivitis or scleral icterus. ENT:  Oropharynx clear without lesion.  Tongue normal. Mucous membranes moist.  RESPIRATORY:  Clear to auscultation without rales, wheezes or rhonchi. CARDIOVASCULAR:  Regular rate and rhythm without murmur, rub or gallop. ABDOMEN:  Scaphoid.  Soft, non-tender, with active bowel sounds, and no hepatosplenomegaly.  No masses. SKIN:  No rashes, ulcers or lesions. EXTREMITIES: No edema, no skin discoloration or tenderness.  No palpable cords. LYMPH NODES: No palpable cervical, supraclavicular, axillary or inguinal adenopathy  NEUROLOGICAL: Unremarkable. PSYCH:  Appropriate.    Infusion on 10/18/2017  Component Date Value Ref Range Status  . Tricyclic, Ur Screen 48/18/5631 NONE DETECTED  NONE DETECTED Final  . Amphetamines, Ur Screen 10/18/2017 NONE DETECTED  NONE DETECTED Final  . MDMA (Ecstasy)Ur Screen 10/18/2017 NONE DETECTED  NONE DETECTED Final  . Cocaine Metabolite,Ur [place] 10/18/2017 NONE DETECTED  NONE DETECTED Final  . Opiate, Ur Screen 10/18/2017 NONE DETECTED  NONE DETECTED Final  . Phencyclidine (PCP) Ur S 10/18/2017 NONE DETECTED  NONE DETECTED Final  . Cannabinoid 50 Ng, Ur Moran 10/18/2017 POSITIVE*  NONE DETECTED Final  . Barbiturates,  Ur Screen 10/18/2017 Result not available. Reagent lot number recalled by manufacturer.* NONE DETECTED Final  . Benzodiazepine, Ur Scrn 10/18/2017 NONE DETECTED  NONE DETECTED Final  . Methadone Scn, Ur 10/18/2017 NONE DETECTED  NONE DETECTED Final   Comment: (NOTE) Tricyclics + metabolites, urine    Cutoff 1000 ng/mL Amphetamines + metabolites, urine  Cutoff 1000 ng/mL MDMA (Ecstasy), urine              Cutoff 500 ng/mL Cocaine Metabolite, urine          Cutoff 300 ng/mL Opiate + metabolites, urine        Cutoff 300 ng/mL Phencyclidine (PCP), urine         Cutoff 25 ng/mL Cannabinoid, urine                 Cutoff 50 ng/mL Barbiturates + metabolites, urine  Cutoff 200 ng/mL Benzodiazepine, urine              Cutoff 200 ng/mL Methadone, urine                   Cutoff 300 ng/mL The urine drug screen provides only a preliminary, unconfirmed analytical test result and should not be used for non-medical purposes. Clinical consideration and professional judgment should be applied to any positive drug screen result due to possible interfering substances. A more specific alternate chemical method must be used in order to obtain a confirmed analytical result. Gas chromatography / mass spectrometry (GC/MS) is the preferred confirmat                          ory method. Performed at [facility], [address] [place], [place] [address]   Orders Only on 10/18/2017  Component Date Value Ref Range Status  . Folate 10/18/2017 17.0  >5.9 ng/mL Final   Performed at [facility], [address] [place], [place] [address]  . Magnesium 10/18/2017 1.6* 1.7 - 2.4 mg/dL Final   Performed at [facility], [address] [place], [place] [address]  . Sodium 10/18/2017 128* 135 - 145 mmol/L Final  . Potassium 10/18/2017 3.6  3.5 - 5.1 mmol/L Final  . Chloride 10/18/2017 98  98 - 111 mmol/L Final   Please note change in reference range.  . CO2 10/18/2017 22  22 - 32 mmol/L  Final  . Glucose, Bld 10/18/2017 123* 70 - 99 mg/dL Final   Please note change in reference range.  . BUN 10/18/2017 5* 6 - 20 mg/dL Final   Please note change in reference range.  . Creatinine, Ser 10/18/2017 0.59* 0.61 - 1.24 mg/dL Final  . Calcium 10/18/2017 8.0* 8.9 - 10.3 mg/dL Final  . GFR calc non Af Amer 10/18/2017 >60  >60 mL/min Final  . GFR calc Af Amer 10/18/2017 >60  >60 mL/min Final   Comment: (NOTE) The eGFR has been calculated using the CKD EPI equation. This calculation has not been validated in all clinical situations. eGFR's persistently <60 mL/min signify possible Chronic Kidney Disease.   Georgiann Hahn gap 10/18/2017 8  5 - 15 Final   Performed at [facility], [address] [place], [place] [address]  . WBC 10/18/2017 6.2  3.8 - 10.6 K/uL Final  . RBC 10/18/2017 2.91* 4.40 - 5.90 MIL/uL Final  . Hemoglobin 10/18/2017 10.9* 13.0 - 18.0 g/dL Final  . HCT 10/18/2017 30.1* 40.0 - 52.0 % Final  .  MCV 10/18/2017 103.5* 80.0 - 100.0 fL Final  . MCH 10/18/2017 37.4* 26.0 - 34.0 pg Final  . MCHC 10/18/2017 36.2* 32.0 - 36.0 g/dL Final  . RDW 10/18/2017 14.4  11.5 - 14.5 % Final  . Platelets 10/18/2017 224  150 - 440 K/uL Final  . Neutrophils Relative % 10/18/2017 79  % Final  . Neutro Abs 10/18/2017 4.9  1.4 - 6.5 K/uL Final  . Lymphocytes Relative 10/18/2017 13  % Final  . Lymphs Abs 10/18/2017 0.8* 1.0 - 3.6 K/uL Final  . Monocytes Relative 10/18/2017 7  % Final  . Monocytes Absolute 10/18/2017 0.4  0.2 - 1.0 K/uL Final  . Eosinophils Relative 10/18/2017 0  % Final  . Eosinophils Absolute 10/18/2017 0.0  0 - 0.7 K/uL Final  . Basophils Relative 10/18/2017 1  % Final  . Basophils Absolute 10/18/2017 0.1  0 - 0.1 K/uL Final   Performed at [facility], [address] [place], [place] [address]  Hospital Outpatient Visit on 10/18/2017  Component Date Value Ref Range Status  . Glucose-Capillary 10/18/2017 70  70 - 99 mg/dL Final    Assessment:   Philip [name] is a 57 y.o. male with extensive stage small cell lung cancer.  He presented with a 2 year history of night sweats and a 36 pound weight loss in 6 months.  Right supraclavicular biopsy on 07/19/2016 revealed small cell carcinoma of the lung.  CEA was 1.7 on 07/31/2016.  Chest CT on 06/28/2016 revealed extensive adenopathy.  There were multiple lymph nodes descending thoracic aorta, largest measuring 2.1 x 1.7 cm.  There was adenopathy in the aortopulmonary window with the largest confluence of lymph nodes measuring 4 x 2.6 cm. There was hilar adenopathy on the left measuring 2 x 1.9 cm. There were multiple enlarged lymph nodes in the pretracheal region, largest 1.7 x 1.7 cm.  There were lymph nodes to the left of the carina measuring 2.8 x 1.9 cm. There was a subcarinal lymph node measuring 2.4 x 1.6 cm. There was a 4 x 3 mm nodular opacity in the anterior segment of the left upper lobe.    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  There was nodal metastasis within the chest and supraclavicular/low jugular regions.  There was multifocal osseous metastasis.  There was possibly a pathologic fracture involving the posteromedial right tenth rib.  There was new left lower lobe opacity with mild hypermetabolism, suspicious for infection or postobstructive pneumonitis.  He completed a course of Levaquin for apost-obstructive pneumonia.  He experienced transient positional left facial burning.  Head MRI on 07/26/2016 revealed no evidence of brain metastasis. There was punctate DWI hyperintensity along a high right frontal gyrus, possible recent infarct.  There was a small occipital bone metastasis.  Head MRA on 07/27/2016 revealed normal large and medium sized vessels.  Bilateral carotid duplex on 07/27/2016 revealed <50% stenosis in the right and left internal carotis arteries.  Echo on 07/27/2016 revealed an EF of 45-50% without cardiac  source of emboli.  He received 4 cycles of carboplatin and etoposide (07/31/2016 - 10/02/2016) with OnPro Neulasta support.  Platelet nadir was 40,000 on day 16 of cycle #2 and 23,000 on day 15 of cycle #3.  He received 2 units of PRBC with cycle #3 and 1 unit with cycle #4.  He received Xgeva on 08/13/2016 (last 10/30/2016).  Chest CT on 09/11/2016 revealed a marked response to therapy.  There was near  complete resolution of left suprahilar mass and thoracic adenopathy.  There was interval sclerosis indicative of healing site of osseous metastasis.  Thoracic spine MRI on 10/22/2016 reveals sclerotic enhancing lesion at T7 vertebral body on the left compatible with metastasis. There was a healing fracture the right medial 10th rib.  PET scan on 11/12/2016 revealed a new 10 mm hypermetabolic left axillary lymph node (SUV 3.6) due to a left hand abscess.  There were no other sites of metabolically active disease.  He completed a course of thoracic spine radiation (3000 cGy in 10 fractions) on 01/11/2017.  He received PCI (3000 cGy in 15 fractions) from 01/28/2017 - 02/15/2017.  Chest, abdomen, and pelvic CT on 03/18/2017 revealed new areas of irregular ground-glass in the medial aspects of the right upper and right lower lobes as well as somewhat amorphous ground-glass in the lingula and left lower lobe. Time interval favored infectious or inflammatory lesions.  Osseous metastatic disease was stable.  There was resolved or resected left axillary lymph node.  Bone scan on 04/10/2017 revealed multiple foci of skeletal metastasis within the ribs, pelvis, and RIGHT clavicle.  Multiple areas of activity corresponding to sclerotic lesions on CT and non-hypermetabolic lesions on PET scan.  PET scan on 04/26/2017 revealed bandlike densities medially in the right upper lobe and medially in the right lower lobe were new from 11/12/2016 but were present on 03/18/2017, and had faint hypermetabolic activity.  Radiation pneumonitis could have this appearance.  The morphology would be unusual for malignancy but surveillance was warranted.  There were scattered stable sclerotic bony lesions that were not hypermetabolic.  There was persistent accentuated activity in the vicinity of the anus may be physiologic but was technically nonspecific.  LEFT hip radiograph on 09/02/2017 revealed degenerative changes in the bilateral hip joints, with the left being more significant than the right. There was no mention of new osseous lytic lesions.  Bone scan on 10/02/2017 revealed multiple sites (sternum, manubrium, LEFT scapula, BILATERAL anterior and posterior ribs, RIGHT L3 vertebral body, superior LEFT SI joint, and anterior LEFT iliac bone) of abnormal osseous tracer accumulation consistent with osseous metastatic disease, with 2 sites of subtle new uptake in the anterior RIGHT approximately fourth rib.  He fell on his right side prior to imaging.  He has chemotherapy induced anemia.  Anemia work-up on 09/11/2016 revealed the following normal labs:  Ferritin (219), iron sat (28%), TIBC (227), B12 (3055), and folate (6.8).  He has folate deficiency.  Folate was 5.9 on 08/27/2017.  B12 was 542 on 08/27/2017.  He has a history of left lower extremity DVT a few months after his cardiac surgery in 08/2014.  He has peripheral vascular disease s/p stent placement in 03/2015.  He is on a Plavix.  He has not had a colonoscopy in 30 years.  He was ineligible for the AbbVie clinical trial ([id number]) secondary to his left hand abscess.  He underwent surgical debridement of a left hand abscess on 11/15/2016.  Wound cultures grew MRSA.  He was treated with emperic IV Vancomycin then Bactrim.   He was admitted to [facility] from 08/26/2017 - 08/30/2017 with nausea, vomiting, dehydration and electrolyte abnormalities.  Head MRI without contrast on 08/29/2017 revealed no evidence of metastatic disease. Prior UGI revealed no evidence of  obstruction or gastroparesis.  Labs revealed no evidence of pancreatitis.  He was started on scheduled antiemetics and PPI BID.  AM cortisol on 08/28/2017 was low.  He was diagnosed with adrenal insufficiency.  He  was started on prednisone 40 mg a day.  He was able to advance his diet.  Tube feeds were avoided.  Symptomatically, he feels a little better.  He is gaining weight slowly.  He denies any headache.  He has generalized pain, controlled. Hemoglobin is 10.9.  Sodium is 128 and magnesium is 1.6.  Plan: 1.  Labs today: CBC with diff, BMP, Mg, folate, thiamine, vitamin C, drug screen. 2.  Urine drug screen. 3.  Discuss interval head MRI.  Isolate small metastasis.  Discussed with radiation oncology.  Patient will be seen on 10/21/2017 at 9:30 AM.  Discuss initiation of Decadron 4 mg a day. 4.  Discuss PET scan.  New small lesion in lung.  No evidence of active bone disease.  At diagnosis, PET scan from 09/2692 revealed metabolically active bone disease.  Will review with radiology.  Consider stereotactic radiation to 8 mm LUL lung lesion given patient's general debilitation and no other site of metastatic disease (except head) rather than systemic chemotherapy (reinitiation of carboplatin and Taxol). 5.  Refer to neurology (Dr Melrose Nakayama) for severe white matter changes. 6.  Review table format of pain diary.  Patient brought in several paragraphs.  Nada Boozer, RN concerted to table format and scanned.  Refill limited supply of pain medications until review of imaging with radiology.  Patient has a scheduled appointment in the pain clinic with Dr. Gillis Santa on 10/31/2017. 7.  Refill oxycodone 5 mg po q 6 hours prn pain (dis: #30) - limited supply as await drug screen and review of imaging by radiology. 8.  Magnesium 1 gm IV today. 9.  Discuss hyponatremia and fluids with electrolytes. 10.  Present at tumor board on 10/24/2017. 11.  RTC in 1 week for NP assessment and labs (BMP).  Addendum:   Urine drug screen available after appointment was + cannabinoid.  No opiate.  Await blood drug screen.  Will need to address at next visit.   Lequita Asal, MD  10/18/2017 , 1:35 PM

## 2017-10-20 ENCOUNTER — Encounter: Payer: Self-pay | Admitting: Hematology and Oncology

## 2017-10-21 ENCOUNTER — Other Ambulatory Visit: Payer: Self-pay

## 2017-10-21 ENCOUNTER — Ambulatory Visit
Admission: RE | Admit: 2017-10-21 | Discharge: 2017-10-21 | Disposition: A | Discharge status: Home / Self Care | Payer: Medicaid Other | Source: Ambulatory Visit | Attending: Radiation Oncology | Admitting: Radiation Oncology

## 2017-10-21 ENCOUNTER — Other Ambulatory Visit: Payer: Self-pay | Admitting: Radiation Oncology

## 2017-10-21 ENCOUNTER — Encounter: Payer: Self-pay | Admitting: Radiation Oncology

## 2017-10-21 ENCOUNTER — Other Ambulatory Visit: Payer: Self-pay | Admitting: *Deleted

## 2017-10-21 VITALS — BP 122/77 | HR 80 | Temp 97.0°F | Resp 20

## 2017-10-21 DIAGNOSIS — D649 Anemia, unspecified: Secondary | ICD-10-CM | POA: Insufficient documentation

## 2017-10-21 DIAGNOSIS — C3412 Malignant neoplasm of upper lobe, left bronchus or lung: Secondary | ICD-10-CM | POA: Insufficient documentation

## 2017-10-21 DIAGNOSIS — C7951 Secondary malignant neoplasm of bone: Secondary | ICD-10-CM | POA: Insufficient documentation

## 2017-10-21 DIAGNOSIS — Z79899 Other long term (current) drug therapy: Secondary | ICD-10-CM | POA: Insufficient documentation

## 2017-10-21 DIAGNOSIS — I739 Peripheral vascular disease, unspecified: Secondary | ICD-10-CM | POA: Insufficient documentation

## 2017-10-21 DIAGNOSIS — C7931 Secondary malignant neoplasm of brain: Secondary | ICD-10-CM

## 2017-10-21 DIAGNOSIS — I119 Hypertensive heart disease without heart failure: Secondary | ICD-10-CM | POA: Insufficient documentation

## 2017-10-21 DIAGNOSIS — Z9221 Personal history of antineoplastic chemotherapy: Secondary | ICD-10-CM | POA: Diagnosis not present

## 2017-10-21 DIAGNOSIS — Z86718 Personal history of other venous thrombosis and embolism: Secondary | ICD-10-CM | POA: Diagnosis not present

## 2017-10-21 DIAGNOSIS — I252 Old myocardial infarction: Secondary | ICD-10-CM | POA: Diagnosis not present

## 2017-10-21 DIAGNOSIS — Z800 Family history of malignant neoplasm of digestive organs: Secondary | ICD-10-CM | POA: Insufficient documentation

## 2017-10-21 DIAGNOSIS — I2510 Atherosclerotic heart disease of native coronary artery without angina pectoris: Secondary | ICD-10-CM | POA: Insufficient documentation

## 2017-10-21 DIAGNOSIS — F17210 Nicotine dependence, cigarettes, uncomplicated: Secondary | ICD-10-CM | POA: Diagnosis not present

## 2017-10-21 DIAGNOSIS — Z510 Encounter for antineoplastic radiation therapy: Secondary | ICD-10-CM | POA: Insufficient documentation

## 2017-10-21 DIAGNOSIS — E785 Hyperlipidemia, unspecified: Secondary | ICD-10-CM | POA: Insufficient documentation

## 2017-10-21 LAB — VITAMIN C: VITAMIN C: 1.3 mg/dL (ref 0.2–2.0)

## 2017-10-21 MED ORDER — FLUCONAZOLE 100 MG PO TABS
100.0000 mg | ORAL_TABLET | Freq: Every day | ORAL | 3 refills | Status: DC
Start: 2017-10-21 — End: 2017-10-31

## 2017-10-21 NOTE — Progress Notes (Addendum)
[place]  Date of Service:  10/21/2017   Chief Complaint: Philip [name] is a 57 y.o. male with extensive stage small cell lung cancer who was referred to radiation oncology by Dr. 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    HPI:  Philip [name] is a 57 year old white male with a history of extensive stage small cell carcinoma who is well known to the department of Radiation Oncology as he was previously evaluated and irradiated to the whole brain to a total dose of 3000 cGy in 15 fractions between 01/28/17-02/15/17 for prophylactic cranial irradiation (PCI). In addition, he was irradiated with palliative intent to the thoracic spine to a dose of 3000 cGy in 10 fractions on 01/12/2018.   Chest CT on 06/28/2016 revealed extensive adenopathy.  There were multiple lymph nodes descending thoracic aorta, largest measuring 2.1 x 1.7 cm.  There was adenopathy in the aortopulmonary window with the largest confluence of lymph nodes measuring 4 x 2.6 cm. There was hilar adenopathy on the left measuring 2 x 1.9 cm. There were multiple enlarged lymph nodes in the pretracheal region, largest 1.7 x 1.7 cm.  There were lymph nodes to the left of the carina measuring 2.8 x 1.9 cm. There was a subcarinal lymph node measuring 2.4 x 1.6 cm. There was a 4 x 3 mm nodular opacity in the anterior segment of the left upper lobe.    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  There was nodal metastasis within the chest and supraclavicular/low jugular regions.  There was multifocal osseous metastasis.  There was possibly a pathologic fracture involving the posteromedial right tenth rib.  There was new left lower lobe opacity with mild hypermetabolism, suspicious for infection or postobstructive  pneumonitis.  He completed a course of Levaquin for apost-obstructive pneumonia.  He experienced transient positional left facial burning.  Head MRI on 07/26/2016 revealed no evidence of brain metastasis. There was punctate DWI hyperintensity along a high right frontal gyrus, possible recent infarct.  There was a small occipital bone metastasis.  Head MRA on 07/27/2016 revealed normal large and medium sized vessels.  Bilateral carotid duplex on 07/27/2016 revealed <50% stenosis in the right and left internal carotis arteries.  Echo on 07/27/2016 revealed an EF of 45-50% without cardiac source of emboli.  He received 4 cycles of carboplatin and etoposide (07/31/2016 - 10/02/2016) with OnPro Neulasta support.  Platelet nadir was 40,000 on day 16 of cycle #2 and 23,000 on day 15 of cycle #3.  He received 2 units of PRBC with cycle #3 and 1 unit with cycle #4.  He received Xgeva on 08/13/2016 (last 10/30/2016).  Chest CT on 09/11/2016 revealed a marked response to therapy.  There was near complete resolution of left suprahilar mass and thoracic adenopathy.  There was interval sclerosis indicative of healing site of osseous metastasis.  Thoracic spine MRI on 10/22/2016 reveals sclerotic enhancing lesion at T7 vertebral body on the left compatible with metastasis. There was a healing fracture the right medial 10th rib.  PET scan on 11/12/2016 revealed a new 10 mm hypermetabolic left axillary lymph node (SUV 3.6) due to a left hand abscess.  There were no other sites of metabolically active disease.  He completed a course of thoracic spine radiation (3000 cGy in  10 fractions) on 01/11/2017.  He received PCI (3000 cGy in 15 fractions) from 01/28/2017 - 02/15/2017.  Chest, abdomen, and pelvic CT on 03/18/2017 revealed new areas of irregular ground-glass in the medial aspects of the right upper and right lower lobes as well as somewhat amorphous ground-glass in the lingula and left lower lobe. Time interval  favored infectious or inflammatory lesions.  Osseous metastatic disease was stable.  There was resolved or resected left axillary lymph node.  Bone scan on 04/10/2017 revealed multiple foci of skeletal metastasis within the ribs, pelvis, and RIGHT clavicle.  Multiple areas of activity corresponding to sclerotic lesions on CT and non-hypermetabolic lesions on PET scan.  PET scan on 04/26/2017 revealed bandlike densities medially in the right upper lobe and medially in the right lower lobe were new from 11/12/2016 but were present on 03/18/2017, and had faint hypermetabolic activity. Radiation pneumonitis could have this appearance.  The morphology would be unusual for malignancy but surveillance was warranted.  There were scattered stable sclerotic bony lesions that were not hypermetabolic.  There was persistent accentuated activity in the vicinity of the anus may be physiologic but was technically nonspecific.  LEFT hip radiograph on 09/02/2017 revealed degenerative changes in the bilateral hip joints, with the left being more significant than the right. There was no mention of new osseous lytic lesions.  Bone scan on 10/02/2017 revealed multiple sites (sternum, manubrium, LEFT scapula, BILATERAL anterior and posterior ribs, RIGHT L3 vertebral body, superior LEFT SI joint, and anterior LEFT iliac bone) of abnormal osseous tracer accumulation consistent with osseous metastatic disease, with 2 sites of subtle new uptake in the anterior RIGHT approximately fourth rib.  He fell on his right side prior to imaging.  He has chemotherapy induced anemia.  Anemia work-up on 09/11/2016 revealed the following normal labs:  Ferritin (219), iron sat (28%), TIBC (227), B12 (3055), and folate (6.8).  He has folate deficiency.  Folate was 5.9 on 08/27/2017.  B12 was 542 on 08/27/2017.  He has a history of left lower extremity DVT a few months after his cardiac surgery in 08/2014.  He has peripheral vascular disease s/p  stent placement in 03/2015.  He is on a Plavix.  He has not had a colonoscopy in 30 years.  He was ineligible for the AbbVie clinical trial (M16-298) secondary to his left hand abscess.  He underwent surgical debridement of a left hand abscess on 11/15/2016.  Wound cultures grew MRSA.  He was treated with emperic IV Vancomycin then Bactrim.   He was admitted to [facility] from 08/26/2017 - 08/30/2017 with nausea, vomiting, dehydration and electrolyte abnormalities.  Head MRI without contrast on 08/29/2017 revealed no evidence of metastatic disease. Prior UGI revealed no evidence of obstruction or gastroparesis.  Labs revealed no evidence of pancreatitis.  He was started on scheduled antiemetics and PPI BID.  AM cortisol on 08/28/2017 was low.  He was diagnosed with adrenal insufficiency.  He was started on prednisone 40 mg a day.  He was able to advance his diet.  Tube feeds were avoided.  MRI of the brain on 10/17/2107 revealed a solitary 6 x 7 mm metastasis without associated vasogenic edema located in the superior right frontal cortex.  As a result of the MRI finding he was referred for evaluation and re-irradiation of the whole brain.    Past Medical History:  Diagnosis Date  . Anemia   . CAD (coronary artery disease)    a. 08/2014 Inf STEMI/CABG x 3 (LIMA->LAD, VG->Diag,  RIMA->RCA).  . DVT, recurrent, lower extremity, acute, left ([place]) 2016  . GIB (gastrointestinal bleeding)    a. In the setting of DAPT-->tolerating plavix only.  . Hyperlipidemia   . Hypertensive heart disease   . Lung cancer ([place])    a. s/p chemo/radiation.  Marland Kitchen PAD (peripheral artery disease) ([place])    a. 03/2015 Periph Angio: short occlusion of L pop w/ evidence of embolization into the DP->5.0x50 mm Inova self-expanding stent;  b. 04/08/2015 ABI: R 1.12, L 0.99; c. 01/2017 LE Duplex: bilat SFA obstructive dzs, patent L pop.  . Tobacco abuse   . Toe amputation status, left [facility]) 07/09/2016   2017    Past Surgical History:   Procedure Laterality Date  . CARDIAC CATHETERIZATION N/A 08/24/2014   Procedure: Left Heart Cath and Coronary Angiography;  Surgeon: Isaias Cowman, MD;  Location: [place] CV LAB;  Service: Cardiovascular;  Laterality: N/A;  . CARDIAC CATHETERIZATION N/A 08/24/2014   Procedure: Coronary Stent Intervention;  Surgeon: Isaias Cowman, MD;  Location: [place] CV LAB;  Service: Cardiovascular;  Laterality: N/A;  . CORONARY ARTERY BYPASS GRAFT N/A 08/25/2014   Procedure: CORONARY ARTERY BYPASS GRAFTING (CABG), ON PUMP, TIMES THREE, USING BILATERAL MAMMARY ARTERIES, RIGHT GREATER SAPHENOUS VEIN HARVESTED ENDOSCOPICALLY;  Surgeon: Melrose Nakayama, MD;  Location: [place];  Service: Open Heart Surgery;  Laterality: N/A;  . ESOPHAGOGASTRODUODENOSCOPY (EGD) WITH PROPOFOL N/A 08/08/2017   Procedure: ESOPHAGOGASTRODUODENOSCOPY (EGD) WITH PROPOFOL;  Surgeon: Jonathon Bellows, MD;  Location: [facility] ENDOSCOPY;  Service: Gastroenterology;  Laterality: N/A;  . INCISION AND DRAINAGE ABSCESS Left 11/15/2016   Procedure: INCISION AND DRAINAGE ABSCESS OF LEFT WRIST;  Surgeon: Roseanne Kaufman, MD;  Location: [place];  Service: Orthopedics;  Laterality: Left;  . PEG PLACEMENT N/A 10/01/2017   Procedure: PERCUTANEOUS ENDOSCOPIC GASTROSTOMY (PEG) PLACEMENT;  Surgeon: Lucilla Lame, MD;  Location: [facility] ENDOSCOPY;  Service: Endoscopy;  Laterality: N/A;  . PERIPHERAL VASCULAR CATHETERIZATION N/A 03/30/2015   Procedure: Abdominal Aortogram w/Lower Extremity;  Surgeon: Wellington Hampshire, MD;  Location: [place] CV LAB;  Service: Cardiovascular;  Laterality: N/A;  . PORTA CATH INSERTION N/A 07/27/2016   Procedure: Glori Luis Cath Insertion;  Surgeon: Algernon Huxley, MD;  Location: [place] CV LAB;  Service: Cardiovascular;  Laterality: N/A;  . TEE WITHOUT CARDIOVERSION N/A 08/25/2014   Procedure: TRANSESOPHAGEAL ECHOCARDIOGRAM (TEE);  Surgeon: Melrose Nakayama, MD;  Location: [place];  Service: Open Heart Surgery;   Laterality: N/A;    Family History  Problem Relation Age of Onset  . Hypertension Father   . Lung cancer Father   . Heart attack Mother 104       STENT  . Hyperlipidemia Mother   . Heart attack Brother   . Heart attack Brother   . Breast cancer Sister   . Heart attack Sister   . Colon cancer Paternal Grandfather     Social History:  reports that he has been smoking cigarettes.  He has been smoking about 0.25 packs per day. He has never used smokeless tobacco. He reports that he drinks alcohol. He reports that he does not use drugs.  The patient smokes 1/2 pack of cigarettes/day (previously 2 1/2 ppd).  He started smoking in his teens.  He denies any exposure to radiation or toxins.  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  He lives in [place].  His wife is named Publishing copy.    Allergies:  Allergies  Allergen Reactions  . Tylenol [Acetaminophen] Palpitations  .  Amitriptyline Other (See Comments)    Burning sensation all over  . Aspirin   . Contrast Media [Iodinated Diagnostic Agents] Other (See Comments)    Pt states that he got "knots behind his ears"  . Nsaids Other (See Comments)    Blood in stools  . Ibuprofen Other (See Comments) and Nausea Only    Blood in stools  . Tape Rash and Other (See Comments)    PLEASE USE COBAN WRAP; THE PATIENT'S SKIN TEARS WHEN BANDAGES ARE REMOVED!!    Current Medications:  atorvastatin (LIPITOR) 40 MG tablet    Calcium Carb-Cholecalciferol (CALCIUM + D3 PO)    clopidogrel (PLAVIX) 75 MG tablet    clotrimazole (MYCELEX) 10 MG troche    dexamethasone (DECADRON) 4 MG tablet    diphenhydrAMINE (BENADRYL) 25 mg capsule    famotidine (PEPCID) 20 MG tablet    FLOVENT HFA 220 MCG/ACT inhaler    folic acid (FOLVITE) 1 MG tablet    hydrochlorothiazide (HYDRODIURIL) 12.5 MG tablet    lidocaine-prilocaine (EMLA) cream    magnesium oxide (MAG-OX) 400 (241.3 Mg) MG tablet    metoCLOPramide (REGLAN) 5 MG tablet    metoprolol tartrate  (LOPRESSOR) 25 MG tablet    Multiple Vitamin (MULTIVITAMIN WITH MINERALS) TABS tablet    OLANZapine zydis (ZYPREXA) 10 MG disintegrating tablet    ondansetron (ZOFRAN-ODT) 8 MG disintegrating tablet    oxyCODONE (OXY IR/ROXICODONE) 5 MG immediate release tablet    pantoprazole (PROTONIX) 40 MG tablet    potassium phosphate, monobasic, (K-PHOS ORIGINAL) 500 MG tablet    PROAIR HFA 108 (90 Base) MCG/ACT inhaler    promethazine (PHENERGAN) 25 MG suppository    senna-docusate (SENOKOT-S) 8.6-50 MG tablet    STIOLTO RESPIMAT 2.5-2.5 MCG/ACT AERS    sucralfate (CARAFATE) 1 g tablet    thiamine 100 MG tablet    vitamin C (VITAMIN C) 500 MG tablet    Vitamin D, Ergocalciferol, (DRISDOL) 50000 units CAPS capsule    bisacodyl (DULCOLAX) 5 MG EC tablet    feeding supplement, ENSURE ENLIVE, (ENSURE ENLIVE) LIQD    fluconazole (DIFLUCAN) 100 MG tablet    predniSONE (DELTASONE) 10 MG tablet     Performance status (ECOG): 2 - Symptomatic, <50% confined to bed  Physical Exam: GENERAL:  Cachectic white gentleman sitting comfortably in a wheelchair in the exam room in no acute distress. He is accompanied today by his loving and very supportive wife.  He is very weak and unable to ambulate without assistance.   ORAL CAVITY/OROPHARYNX: The mucous membranes are moist.  The tongue is freely mobile.  The are no mucosal lesions.  Ritta Slot is present along the posterior tonsillar pillars bilaterally. MENTAL STATUS:  Alert and oriented to person, place and time. HEAD:  Short hair.  Normocephalic, atraumatic, face symmetric, no Cushingoid features. EYES:  Brown eyes.  Pupils equal round and reactive to light and accomodation.  No conjunctivitis or scleral icterus. ENT:  Oropharynx clear without lesion.  Tongue normal. Mucous membranes moist.  RESPIRATORY:  Clear to auscultation without rales, or wheezes.  Scattered rhonchi are present in both lung fields, L>R. CARDIOVASCULAR:  Regular rate and rhythm without  murmur, rub or gallop. ABDOMEN:  Soft, non-tender, with active bowel sounds, and no hepatosplenomegaly.  No masses. SKIN:  No rashes, ulcers or lesions. EXTREMITIES: No edema, no skin discoloration or tenderness.  No palpable cords. LYMPH NODES: No palpable cervical, supraclavicular, axillary or inguinal adenopathy  NEUROLOGICAL: Unremarkable. No focal neurologic deficits.   PSYCH:  Appropriate.   Assessment:  Philip [name] is a 58 y.o. male with extensive stage small cell lung cancer who was treated with prophylactic cranial irradiation to a total dose of 3000 cGy in 15 fractions between 01/28/2017 and 02/15/2017 who was found on an MRI of the brain on 10/16/2017 to have a new 6 x 7 mm metastasis to the superior right frontal cortex without associated edema.  He was referred to radiation oncology today for discussion regarding the potential role of palliative whole brain re-irradiation.    Today I had an opportunity to personally review the MRI of the brain from 10/16/17 and discuss re-irradiation of the brain with Philip [name] and his wife.  Specifically, I discussed with them the fact that re-irradiation would be associated with an increase in risks and potential complications both in the acute and late setting.  These risks might include damage to any of the structures in the brain including the optic apparatus resulting in blindness.  They may also include a marked reduction inf both short term and long term memory.  There could be a notable cognitive deficit.  There would definitely be acute hair loss and possibly long term hair loss. He will likely experience acute fatigue which could be quite severe.     In order to reduce the aforementioned risks I had considered partial brain irradiation but in the setting of small cell carcinoma I am reluctant to do so as the natural history of small cell would lead me to believe that he is at significant risk of developing new and additional brain metastases  in the future.  Partial brain irradiation would present difficulty matching on a new brain field without overlap.   I spent a considerable amount of time with Philip [name] reviewing the rationale for the use of palliative whole brain radiation therapy, the logistics of radiation therapy as well as the potential risks and complications.  Throughout my discuss with the [place] they were given an opportunity to ask questions that appeared to have been answered to their satisfication.  I plan to simulate Philip [name] today and itiate a course of whole brain irradiation--22000 cGy in 10 fractions--tomorrow.  As always, thank you very much for allowing me to participate in the care of this most pleasant, yet unfortunate gentleman.  Orelia Brandstetter A. Claudie Leach, MD, MPH 10/21/2017

## 2017-10-22 ENCOUNTER — Other Ambulatory Visit: Payer: Self-pay | Admitting: Urgent Care

## 2017-10-22 ENCOUNTER — Ambulatory Visit
Admission: RE | Admit: 2017-10-22 | Discharge: 2017-10-22 | Disposition: A | Discharge status: Home / Self Care | Payer: Medicaid Other | Source: Ambulatory Visit | Attending: Radiation Oncology | Admitting: Radiation Oncology

## 2017-10-22 DIAGNOSIS — E871 Hypo-osmolality and hyponatremia: Secondary | ICD-10-CM

## 2017-10-22 DIAGNOSIS — Z510 Encounter for antineoplastic radiation therapy: Secondary | ICD-10-CM | POA: Diagnosis not present

## 2017-10-22 DIAGNOSIS — E860 Dehydration: Secondary | ICD-10-CM

## 2017-10-22 LAB — VITAMIN B1: Vitamin B1 (Thiamine): 139.3 nmol/L (ref 66.5–200.0)

## 2017-10-23 ENCOUNTER — Ambulatory Visit
Admission: RE | Admit: 2017-10-23 | Discharge: 2017-10-23 | Disposition: A | Discharge status: Home / Self Care | Payer: Medicaid Other | Source: Ambulatory Visit | Attending: Radiation Oncology | Admitting: Radiation Oncology

## 2017-10-23 DIAGNOSIS — Z510 Encounter for antineoplastic radiation therapy: Secondary | ICD-10-CM | POA: Diagnosis not present

## 2017-10-23 LAB — THC,MS,WB/SP RFX
Cannabidiol: NEGATIVE ng/mL
Cannabinoid Confirmation: POSITIVE
Cannabinol: NEGATIVE ng/mL
Carboxy-THC: 104.8 ng/mL
Hydroxy-THC: 2.2 ng/mL
Tetrahydrocannabinol(THC): 3.7 ng/mL

## 2017-10-24 ENCOUNTER — Ambulatory Visit
Admission: RE | Admit: 2017-10-24 | Discharge: 2017-10-24 | Disposition: A | Discharge status: Home / Self Care | Payer: Medicaid Other | Source: Ambulatory Visit | Attending: Radiation Oncology | Admitting: Radiation Oncology

## 2017-10-24 DIAGNOSIS — Z510 Encounter for antineoplastic radiation therapy: Secondary | ICD-10-CM | POA: Diagnosis not present

## 2017-10-25 ENCOUNTER — Telehealth: Payer: Self-pay | Admitting: *Deleted

## 2017-10-25 ENCOUNTER — Ambulatory Visit: Payer: Medicaid Other | Admitting: Urgent Care

## 2017-10-25 ENCOUNTER — Inpatient Hospital Stay: Payer: Medicaid Other

## 2017-10-25 ENCOUNTER — Ambulatory Visit
Admission: RE | Admit: 2017-10-25 | Discharge: 2017-10-25 | Disposition: A | Discharge status: Home / Self Care | Payer: Medicaid Other | Source: Ambulatory Visit | Attending: Radiation Oncology | Admitting: Radiation Oncology

## 2017-10-25 DIAGNOSIS — E860 Dehydration: Secondary | ICD-10-CM

## 2017-10-25 DIAGNOSIS — C3412 Malignant neoplasm of upper lobe, left bronchus or lung: Secondary | ICD-10-CM | POA: Diagnosis not present

## 2017-10-25 DIAGNOSIS — Z510 Encounter for antineoplastic radiation therapy: Secondary | ICD-10-CM | POA: Diagnosis not present

## 2017-10-25 DIAGNOSIS — E871 Hypo-osmolality and hyponatremia: Secondary | ICD-10-CM

## 2017-10-25 LAB — COMPREHENSIVE METABOLIC PANEL
ALBUMIN: 2.8 g/dL — AB (ref 3.5–5.0)
ALK PHOS: 57 U/L (ref 38–126)
ALT: 22 U/L (ref 0–44)
ANION GAP: 7 (ref 5–15)
AST: 22 U/L (ref 15–41)
BILIRUBIN TOTAL: 0.5 mg/dL (ref 0.3–1.2)
BUN: 8 mg/dL (ref 6–20)
CALCIUM: 8.6 mg/dL — AB (ref 8.9–10.3)
CO2: 26 mmol/L (ref 22–32)
CREATININE: 0.62 mg/dL (ref 0.61–1.24)
Chloride: 96 mmol/L — ABNORMAL LOW (ref 98–111)
GFR calc Af Amer: >60 mL/min (ref >60)
GFR calc non Af Amer: >60 mL/min (ref >60)
Glucose, Bld: 85 mg/dL (ref 70–99)
Potassium: 4.4 mmol/L (ref 3.5–5.1)
SODIUM: 129 mmol/L — AB (ref 135–145)
Total Protein: 5.2 g/dL — ABNORMAL LOW (ref 6.5–8.1)

## 2017-10-25 NOTE — Telephone Encounter (Signed)
Patient presented back to the lobby wanting to know "what the deal was". I went out into the lobby and spoke with the patient. He was seated comfortably in a wheelchair with NAD noted. Respirations even and non-labored. He was not diaphoretic. He was not shifting about in his chair.   I informed him that because of his UDS being (+) for THC, I was unable to prescribe opioid pain medications to him. I informed him that I had spoken to Dr. Mike Gip and she agrees. Additionally, I spoke with the attending provider on call Janese Banks, MD) and was advised to deny the request for opioid pain medication.   Patient became upset and became to wheel himself away from this provider. Patient states, "I will just go somewhere else".   Patient has appointments with 2 different pain clinics next week. I am unsure which appointment/provider he is planning to go to. He has been advised that pain clinic is going to require him to be THC (-) in order to responsibly prescribe opioid medications.   Honor Loh, MSN, APRN, FNP-C, CEN Oncology/Hematology Nurse Practitioner  [facility] 10/25/17, 10:30 AM

## 2017-10-25 NOTE — Telephone Encounter (Signed)
-----   Message from Karen Kitchens, NP sent at 10/25/2017  9:38 AM EDT ----- Unfortunately, I cannot refill. Dr. Mike Gip pulled a UDS on him and he was THC positive. She gave him his last Rx before she knew that. His UDS was also (-) for opioids, so blood has been sent out to confirm. Results pending. She advised me that she would not have given him a Rx last time if she had known he was still using THC.   He has an appointment on 07/15 at the pain clinic to establish care.   Gaspar Bidding  ----- Message ----- From: Manus Rudd, RN Sent: 10/25/2017   9:18 AM To: Karen Kitchens, NP, Shirlean Kelly, RN  Patient came to front desk asking to make sure his pain medication had been refilled.

## 2017-10-25 NOTE — Telephone Encounter (Signed)
Called patient left message regarding our inability to refill his medications and that he has an appointment with Pain Management on 7/15.

## 2017-10-28 ENCOUNTER — Ambulatory Visit
Admission: RE | Admit: 2017-10-28 | Discharge: 2017-10-28 | Disposition: A | Discharge status: Home / Self Care | Payer: Medicaid Other | Source: Ambulatory Visit | Attending: Radiation Oncology | Admitting: Radiation Oncology

## 2017-10-28 ENCOUNTER — Telehealth: Payer: Self-pay | Admitting: Urgent Care

## 2017-10-28 DIAGNOSIS — Z510 Encounter for antineoplastic radiation therapy: Secondary | ICD-10-CM | POA: Diagnosis not present

## 2017-10-28 LAB — OXYCODONES,MS,WB/SP RFX
Oxycocone: 25.6 ng/mL
Oxycodones Confirmation: POSITIVE
Oxymorphone: NEGATIVE ng/mL

## 2017-10-28 LAB — DRUG SCREEN 10 W/CONF, SERUM
Amphetamines, IA: NEGATIVE ng/mL
Barbiturates, IA: NEGATIVE ug/mL
Benzodiazepines, IA: NEGATIVE ng/mL
Cocaine & Metabolite, IA: NEGATIVE ng/mL
Methadone, IA: NEGATIVE ng/mL
Opiates, IA: NEGATIVE ng/mL
Oxycodones, IA: POSITIVE ng/mL
Phencyclidine, IA: NEGATIVE ng/mL
Propoxyphene, IA: NEGATIVE ng/mL
THC(Marijuana) Metabolite, IA: POSITIVE ng/mL

## 2017-10-28 NOTE — Telephone Encounter (Signed)
Re: Pain management appointments  Call placed to [facility] this afternoon. It was after business hours, therefore a message was left. I called to determine whether or not patient presented for his scheduled appointment with them today to establish care for pain management services. I asked for a return call to advise me on whether or not Philip [name] came for is appointment.   Of note, unless he decided to cancel his appointment, patient has an alternate appointment scheduled with the pain clinic here at the hospital. He is scheduled to see Dr. Gillis Santa on 10/31/2017 at 1400.  Will follow up.   Honor Loh, MSN, APRN, FNP-C, CEN Oncology/Hematology Nurse Practitioner  [facility] 10/28/17, 5:58 PM

## 2017-10-29 ENCOUNTER — Ambulatory Visit
Admission: RE | Admit: 2017-10-29 | Discharge: 2017-10-29 | Disposition: A | Discharge status: Home / Self Care | Payer: Medicaid Other | Source: Ambulatory Visit | Attending: Radiation Oncology | Admitting: Radiation Oncology

## 2017-10-29 ENCOUNTER — Telehealth: Payer: Self-pay | Admitting: Urgent Care

## 2017-10-29 DIAGNOSIS — Z510 Encounter for antineoplastic radiation therapy: Secondary | ICD-10-CM | POA: Diagnosis not present

## 2017-10-29 NOTE — Telephone Encounter (Signed)
Re: Appointment with pain clinic  Received a return call from [facility] at [facility] today regarding patient's appointment.  Patient had an appointment scheduled yesterday, 10/28/2017, to establish care for pain management services.  I was informed that patient had called to cancel his appointment citing that he had an appointment with another provider to establish care.  Cora added that they would be happy to see patient should he need them in the future.  Again, patient is scheduled to see Dr. Holley Raring on 10/31/2017 to establish care and pain management services.  Patient has been made aware of the pain management philosophy. He has been advised on several occasions that concurrent use of opioids and illegal substances is not safe, and may preclude him from being able to be seen by pain management.   Honor Loh, MSN, APRN, FNP-C, CEN Oncology/Hematology Nurse Practitioner  [facility] 10/29/17, 5:33 PM

## 2017-10-30 ENCOUNTER — Ambulatory Visit
Admission: RE | Admit: 2017-10-30 | Discharge: 2017-10-30 | Disposition: A | Discharge status: Home / Self Care | Payer: Medicaid Other | Source: Ambulatory Visit | Attending: Radiation Oncology | Admitting: Radiation Oncology

## 2017-10-30 DIAGNOSIS — Z510 Encounter for antineoplastic radiation therapy: Secondary | ICD-10-CM | POA: Diagnosis not present

## 2017-10-31 ENCOUNTER — Ambulatory Visit
Payer: Medicaid Other | Attending: Student in an Organized Health Care Education/Training Program | Admitting: Student in an Organized Health Care Education/Training Program

## 2017-10-31 ENCOUNTER — Encounter: Payer: Self-pay | Admitting: Student in an Organized Health Care Education/Training Program

## 2017-10-31 ENCOUNTER — Other Ambulatory Visit: Payer: Self-pay

## 2017-10-31 ENCOUNTER — Ambulatory Visit
Admission: RE | Admit: 2017-10-31 | Discharge: 2017-10-31 | Disposition: A | Discharge status: Home / Self Care | Payer: Medicaid Other | Source: Ambulatory Visit | Attending: Radiation Oncology | Admitting: Radiation Oncology

## 2017-10-31 VITALS — BP 135/98 | HR 103 | Temp 98.3°F | Resp 16 | Ht 71.0 in | Wt 114.0 lb

## 2017-10-31 DIAGNOSIS — Z959 Presence of cardiac and vascular implant and graft, unspecified: Secondary | ICD-10-CM | POA: Insufficient documentation

## 2017-10-31 DIAGNOSIS — M25559 Pain in unspecified hip: Secondary | ICD-10-CM | POA: Insufficient documentation

## 2017-10-31 DIAGNOSIS — Z79899 Other long term (current) drug therapy: Secondary | ICD-10-CM | POA: Diagnosis not present

## 2017-10-31 DIAGNOSIS — G894 Chronic pain syndrome: Secondary | ICD-10-CM | POA: Insufficient documentation

## 2017-10-31 DIAGNOSIS — G8929 Other chronic pain: Secondary | ICD-10-CM | POA: Diagnosis not present

## 2017-10-31 DIAGNOSIS — Z9889 Other specified postprocedural states: Secondary | ICD-10-CM | POA: Diagnosis not present

## 2017-10-31 DIAGNOSIS — M14671 Charcot's joint, right ankle and foot: Secondary | ICD-10-CM

## 2017-10-31 DIAGNOSIS — C3490 Malignant neoplasm of unspecified part of unspecified bronchus or lung: Secondary | ICD-10-CM

## 2017-10-31 DIAGNOSIS — C3412 Malignant neoplasm of upper lobe, left bronchus or lung: Secondary | ICD-10-CM | POA: Diagnosis not present

## 2017-10-31 DIAGNOSIS — Z510 Encounter for antineoplastic radiation therapy: Secondary | ICD-10-CM | POA: Diagnosis not present

## 2017-10-31 DIAGNOSIS — M792 Neuralgia and neuritis, unspecified: Secondary | ICD-10-CM | POA: Diagnosis not present

## 2017-10-31 DIAGNOSIS — M79671 Pain in right foot: Secondary | ICD-10-CM | POA: Diagnosis not present

## 2017-10-31 DIAGNOSIS — Z951 Presence of aortocoronary bypass graft: Secondary | ICD-10-CM | POA: Diagnosis not present

## 2017-10-31 DIAGNOSIS — I252 Old myocardial infarction: Secondary | ICD-10-CM

## 2017-10-31 DIAGNOSIS — K219 Gastro-esophageal reflux disease without esophagitis: Secondary | ICD-10-CM | POA: Diagnosis not present

## 2017-10-31 DIAGNOSIS — I739 Peripheral vascular disease, unspecified: Secondary | ICD-10-CM | POA: Insufficient documentation

## 2017-10-31 DIAGNOSIS — M549 Dorsalgia, unspecified: Secondary | ICD-10-CM | POA: Insufficient documentation

## 2017-10-31 DIAGNOSIS — D509 Iron deficiency anemia, unspecified: Secondary | ICD-10-CM | POA: Diagnosis not present

## 2017-10-31 DIAGNOSIS — M79672 Pain in left foot: Secondary | ICD-10-CM

## 2017-10-31 DIAGNOSIS — C7951 Secondary malignant neoplasm of bone: Secondary | ICD-10-CM | POA: Diagnosis not present

## 2017-10-31 DIAGNOSIS — E785 Hyperlipidemia, unspecified: Secondary | ICD-10-CM | POA: Diagnosis not present

## 2017-10-31 DIAGNOSIS — I2510 Atherosclerotic heart disease of native coronary artery without angina pectoris: Secondary | ICD-10-CM | POA: Insufficient documentation

## 2017-10-31 DIAGNOSIS — M546 Pain in thoracic spine: Secondary | ICD-10-CM

## 2017-10-31 DIAGNOSIS — I10 Essential (primary) hypertension: Secondary | ICD-10-CM | POA: Insufficient documentation

## 2017-10-31 MED ORDER — PREGABALIN 50 MG PO CAPS: ORAL_CAPSULE | ORAL | 1 refills | Status: DC

## 2017-10-31 MED ORDER — TIZANIDINE HCL 4 MG PO TABS: 4.0000 mg | ORAL_TABLET | Freq: Two times a day (BID) | ORAL | 1 refills | Status: DC | PRN

## 2017-10-31 NOTE — Patient Instructions (Addendum)
You were given one prescription for Lyrica today.  A prescription for Tizanidine was sent to your pharmacy.

## 2017-10-31 NOTE — Progress Notes (Signed)
Patient's Name: Philip [name]  MRN: [id number]  Referring Provider: Wellington Hampshire, MD  DOB: Oct 10, 1960  PCP: Lorelee Market, MD  DOS: 10/31/2017  Note by: Gillis Santa, MD  Service setting: Ambulatory outpatient  Specialty: Interventional Pain Management  Location: [facility] (AMB) Pain Management Facility  Visit type: Initial Patient Evaluation  Patient type: New Patient   Primary Reason(s) for Visit: Encounter for initial evaluation of one or more chronic problems (new to examiner) potentially causing chronic pain, and posing a threat to normal musculoskeletal function. (Level of risk: High) CC: Back Pain and Hip Pain  HPI  Philip [name] is a 57 y.o. year old, male patient, who comes today to see [place] for the first time for an initial evaluation of his chronic pain. He has Bilateral foot pain; Tobacco use disorder; Convulsions ([place]); Preventative health care; Right ankle pain; History of MI (myocardial infarction); CAD (coronary artery disease), native coronary artery; S/P CABG x 3; Acute myocardial infarction of inferior wall ([place]); Acute sinusitis, unspecified; Hypertension; Pain in joint involving ankle and foot; Pain in soft tissues of limb; Status post aorto-coronary artery bypass graft; Pure hypercholesterolemia; Rash and other nonspecific skin eruption; PAD (peripheral artery disease) ([place]); Hyperlipidemia, unspecified; Peripheral neuropathy; Iron deficiency anemia; Lymphadenopathy, mediastinal; Weight loss; Night sweats; Small cell lung cancer ([place]); Bone metastasis ([place]); Postobstructive pneumonia; Goals of care, counseling/discussion; Acute CVA (cerebrovascular accident) ([place]); Cancer of upper lobe of left lung ([place]); Cancer related pain; Cough; Routine history and physical examination of adult; Hyponatremia; Hypocalcemia; Hypomagnesemia; Charcot's joint of foot (Right); Chronic foot pain (Left); Chronic pain syndrome; Dry gangrene ([place]); GERD (gastroesophageal reflux disease); Myocardial  infarction ([place]); Neuropathic pain; Osteopenia of both feet; Encounter for antineoplastic chemotherapy; Chronic thoracic spine pain; Antineoplastic chemotherapy induced anemia; Abscess of left hand; Non-intractable vomiting with nausea; Hypokalemia; Dehydration; Skin abscess; Lung cancer metastatic to bone ([place]); Neoplasm of thoracic spine; Marijuana user; Abnormal drug screen; Weakness; Intractable nausea and vomiting; Protein-calorie malnutrition, severe; Adrenal insufficiency ([place]); Folate deficiency; Metastasis to brain ([place]); and White matter disease on their problem list. Today he comes in for evaluation of his Back Pain and Hip Pain  Pain Assessment: Location: Lower Back Radiating: both hips and legs Onset: More than a month ago Duration: Chronic pain Quality: Sharp Severity: 9 /10 (subjective, self-reported pain score)  Note: Reported level is inconsistent with clinical observations. Clinically the patient looks like a 4/10 A 4/10 is viewed as "Moderately Severe" and described as impossible to ignore for more than a few minutes. With effort, patients may still be able to manage work or participate in some social activities. Very difficult to concentrate. Signs of autonomic nervous system discharge are evident: dilated pupils (mydriasis); mild sweating (diaphoresis); sleep interference. Heart rate becomes elevated (>115 bpm). Diastolic blood pressure (lower number) rises above 100 mmHg. Patients find relief in laying down and not moving. Information on the proper use of the pain scale provided to the patient today. When using our objective Pain Scale, levels between 6 and 10/10 are said to belong in an emergency room, as it progressively worsens from a 6/10, described as severely limiting, requiring emergency care not usually available at an outpatient pain management facility. At a 6/10 level, communication becomes difficult and requires great effort. Assistance to reach the emergency department may  be required. Facial flushing and profuse sweating along with potentially dangerous increases in heart rate and blood pressure will be evident. Effect on ADL:   Timing: Constant Modifying factors: nothing BP: (!) 135/98  HR: (!) 103  Onset and Duration: Present longer than 3 months Cause of pain: cancer Severity: Getting worse and NAS-11 at its worse: 9/10 Timing: Morning, Afternoon and During activity or exercise Aggravating Factors: Bending, Climbing, Eating, Intercourse (sex), Kneeling, Lifiting, Motion, Prolonged standing, Squatting, Stooping , Twisting, Walking, Walking uphill, Walking downhill and Working Alleviating Factors: Biofeedback, Stretching, Resting, Sitting, Sleeping, Standing and Warm showers or baths Associated Problems: Night-time cramps, Depression, Dizziness, Erectile dysfunction, Fatigue, Inability to concentrate, Numbness, Sadness, Swelling, Vomiting , Weakness, Pain that wakes patient up and Pain that does not allow patient to sleep Quality of Pain: Aching, Cramping, Disabling, Exhausting, Horrible, Punishing, Sharp, Shooting and Stabbing Previous Examinations or Tests: CT scan, Endoscopy and MRI scan Previous Treatments: Pool exercises, Steroid treatments by mouth and Stretching exercises  The patient comes into the clinics today for the first time for a chronic pain management evaluation.  57 year old male with a history of chronic pain secondary to lung cancer status post chemoradiation now with relapse.  Patient was obtaining chronic opioid therapy from his oncologist but had a urine drug screen that was positive for [facility] and was discharged from his oncology practice for pain management.  Patient does have a history of peripheral arterial disease as well as coronary artery disease and has left popliteal stent in place.  Patient does have a history of chronic tobacco abuse along with a family history of lung cancer.  Patient follows up today for consideration of chronic  opioid therapy in the context of relapse of his lung cancer.Patient has previously tried gabapentin, amitriptyline, nortriptyline along with various NSAIDs.  Gabapentin resulted in side effects of sedation and vivid dreams.  He also had cognitive side effects with amitriptyline and NSAID made him vomit.  Patient has lost a significant amount of weight given his malignancy.  Patient does utilize marijuana for appetite stimulation and for nausea management.  His oxycodone dose is 5 mg 3 times daily to 4 times daily as needed.    Today I took the time to provide the patient with information regarding my pain practice. The patient was informed that my practice is divided into two sections: an interventional pain management section, as well as a completely separate and distinct medication management section. I explained that I have procedure days for my interventional therapies, and evaluation days for follow-ups and medication management. Because of the amount of documentation required during both, they are kept separated. This means that there is the possibility that he may be scheduled for a procedure on one day, and medication management the next. I have also informed him that because of staffing and facility limitations, I no longer take patients for medication management only. To illustrate the reasons for this, I gave the patient the example of surgeons, and how inappropriate it would be to refer a patient to his/her care, just to write for the post-surgical antibiotics on a surgery done by a different surgeon.   Because interventional pain management is my board-certified specialty, the patient was informed that joining my practice means that they are open to any and all interventional therapies. I made it clear that this does not mean that they will be forced to have any procedures done. What this means is that I believe interventional therapies to be essential part of the diagnosis and proper management of  chronic pain conditions. Therefore, patients not interested in these interventional alternatives will be better served under the care of a different practitioner.  The patient was also  made aware of my Comprehensive Pain Management Safety Guidelines where by joining my practice, they limit all of their nerve blocks and joint injections to those done by our practice, for as long as we are retained to manage their care.   Historic Controlled Substance Pharmacotherapy Review  PMP and historical list of controlled substances: Oxycodone 5 mg 3 times daily to 4 times daily as needed, quantity 120/month (receives weekly prescriptions from his oncologist in parentheses MME/day: 25-35 mg/day Medications: The patient did not bring the medication(s) to the appointment, as requested in our "New Patient Package" Pharmacodynamics: Desired effects: Analgesia: The patient reports >50% benefit. Reported improvement in function: The patient reports medication allows him to accomplish basic ADLs. Clinically meaningful improvement in function (CMIF): Sustained CMIF goals met Perceived effectiveness: Described as relatively effective, allowing for increase in activities of daily living (ADL) Undesirable effects: Side-effects or Adverse reactions: None reported Historical Monitoring: The patient  reports that he does not use drugs. List of all UDS Test(s): Lab Results  Component Value Date   MDMA NONE DETECTED 10/18/2017   MDMA NONE DETECTED 04/17/2017   MDMA NONE DETECTED 04/11/2017   MDMA NONE DETECTED 07/26/2016   COCAINSCRNUR NONE DETECTED 10/18/2017   COCAINSCRNUR NONE DETECTED 04/17/2017   COCAINSCRNUR NONE DETECTED 04/11/2017   COCAINSCRNUR NONE DETECTED 07/26/2016   PCPSCRNUR NONE DETECTED 10/18/2017   PCPSCRNUR NONE DETECTED 04/17/2017   PCPSCRNUR NONE DETECTED 04/11/2017   PCPSCRNUR NONE DETECTED 07/26/2016   THCU POSITIVE (A) 10/18/2017   THCU NONE DETECTED 04/17/2017   THCU POSITIVE (A)  04/11/2017   THCU POSITIVE (A) 07/26/2016   List of other Serum/Urine Drug Screening Test(s):  Lab Results  Component Value Date   AMPHSCRSER Negative 10/18/2017   AMPHSCRSER Negative 04/17/2017   BARBSCRSER Negative 10/18/2017   BARBSCRSER Negative 04/17/2017   BENZOSCRSER Negative 10/18/2017   BENZOSCRSER Negative 04/17/2017   COCAINSCRSER Negative 10/18/2017   COCAINSCRSER Negative 04/17/2017   COCAINSCRNUR NONE DETECTED 10/18/2017   COCAINSCRNUR NONE DETECTED 04/17/2017   COCAINSCRNUR NONE DETECTED 04/11/2017   COCAINSCRNUR NONE DETECTED 07/26/2016   PCPSCRSER Negative 10/18/2017   PCPSCRSER Negative 04/17/2017   THCSCRSER ++POSITIVE++ 10/18/2017   THCSCRSER ++POSITIVE++ 04/17/2017   THCU POSITIVE (A) 10/18/2017   THCU NONE DETECTED 04/17/2017   THCU POSITIVE (A) 04/11/2017   THCU POSITIVE (A) 07/26/2016   OPIATESCRSER Negative 10/18/2017   OPIATESCRSER Negative 04/17/2017   OXYSCRSER ++POSITIVE++ 10/18/2017   OXYSCRSER ++POSITIVE++ 04/17/2017   PROPOXSCRSER Negative 10/18/2017   PROPOXSCRSER Negative 04/17/2017   Historical Background Evaluation: [place] PMP: Six (6) year initial data search conducted.             [place] Department of public safety, offender search: Editor, commissioning Information) Non-contributory Risk Assessment Profile: Aberrant behavior: None observed or detected today Risk factors for fatal opioid overdose: None identified today Fatal overdose hazard ratio (HR): Calculation deferred Non-fatal overdose hazard ratio (HR): Calculation deferred Risk of opioid abuse or dependence: 0.7-3.0% with doses ? 36 MME/day and 6.1-26% with doses ? 120 MME/day. Substance use disorder (SUD) risk level: See ORT evaluation Opioid risk tool (ORT) (Total Score): 5 Opioid Risk Tool - 10/31/17 1305      Family History of Substance Abuse   Alcohol  Positive Male    Illegal Drugs  Negative    Rx Drugs  Negative      Personal History of Substance Abuse   Alcohol  Negative     Illegal Drugs  Positive Male or Male    Rx Drugs  Negative      Age   Age between 28-45 years   No      History of Preadolescent Sexual Abuse   History of Preadolescent Sexual Abuse  Negative or Male      Psychological Disease   Psychological Disease  Negative    Depression  Negative      Total Score   Opioid Risk Tool Scoring  5    Opioid Risk Interpretation  Moderate Risk      ORT Scoring interpretation table:  Score <3 = Low Risk for SUD  Score between 4-7 = Moderate Risk for SUD  Score >8 = High Risk for Opioid Abuse   PHQ-2 Depression Scale:  Total score: 0  PHQ-2 Scoring interpretation table: (Score and probability of major depressive disorder)  Score 0 = No depression  Score 1 = 15.4% Probability  Score 2 = 21.1% Probability  Score 3 = 38.4% Probability  Score 4 = 45.5% Probability  Score 5 = 56.4% Probability  Score 6 = 78.6% Probability   PHQ-9 Depression Scale:  Total score: 0  PHQ-9 Scoring interpretation table:  Score 0-4 = No depression  Score 5-9 = Mild depression  Score 10-14 = Moderate depression  Score 15-19 = Moderately severe depression  Score 20-27 = Severe depression (2.4 times higher risk of SUD and 2.89 times higher risk of overuse)   Pharmacologic Plan: As per protocol, I have not taken over any controlled substance management, pending the results of ordered tests and/or consults.            Initial impression: Pending review of available data and ordered tests.  Meds   Current Outpatient Medications:  .  atorvastatin (LIPITOR) 40 MG tablet, TAKE 1 TABLET BY MOUTH DAILY, Disp: 90 tablet, Rfl: 3 .  bisacodyl (DULCOLAX) 5 MG EC tablet, Take 1 tablet (5 mg total) by mouth daily as needed for moderate constipation., Disp: 30 tablet, Rfl: 0 .  Calcium Carb-Cholecalciferol (CALCIUM + D3 PO), Take 1 tablet by mouth 3 (three) times daily., Disp: , Rfl:  .  clopidogrel (PLAVIX) 75 MG tablet, Take 1 tablet (75 mg total) by mouth daily., Disp: 90  tablet, Rfl: 3 .  dexamethasone (DECADRON) 4 MG tablet, Take 1 tablet (4 mg total) by mouth daily., Disp: 20 tablet, Rfl: 0 .  diphenhydrAMINE (BENADRYL) 25 mg capsule, Take 25 mg by mouth 2 (two) times daily as needed for allergies. , Disp: , Rfl:  .  famotidine (PEPCID) 20 MG tablet, Take 1 tablet (20 mg total) by mouth 2 (two) times daily., Disp: 60 tablet, Rfl: 0 .  feeding supplement, ENSURE ENLIVE, (ENSURE ENLIVE) LIQD, Take 237 mLs by mouth 3 (three) times daily between meals., Disp: 237 mL, Rfl: 12 .  FLOVENT HFA 220 MCG/ACT inhaler, INL 2 PFS PO BID, Disp: , Rfl: 5 .  folic acid (FOLVITE) 1 MG tablet, Take 1 tablet (1 mg total) by mouth daily., Disp: 30 tablet, Rfl: 0 .  hydrochlorothiazide (HYDRODIURIL) 12.5 MG tablet, Take 1 tablet by mouth daily., Disp: , Rfl: 2 .  lidocaine-prilocaine (EMLA) cream, Apply to affected area once, Disp: 30 g, Rfl: 3 .  magnesium oxide (MAG-OX) 400 (241.3 Mg) MG tablet, Take 1 tablet (400 mg total) by mouth 3 (three) times daily., Disp: 90 tablet, Rfl: 3 .  metoCLOPramide (REGLAN) 5 MG tablet, Take 1 tablet (5 mg total) by mouth 4 (four) times daily -  before meals and at bedtime., Disp: 100 tablet, Rfl: 0 .  metoprolol tartrate (LOPRESSOR) 25 MG tablet, TAKE 1 TABLET BY MOUTH TWICE DAILY, Disp: 180 tablet, Rfl: 3 .  Multiple Vitamin (MULTIVITAMIN WITH MINERALS) TABS tablet, Take 1 tablet by mouth daily., Disp: 30 tablet, Rfl: 1 .  OLANZapine zydis (ZYPREXA) 10 MG disintegrating tablet, Take 1 tablet (10 mg total) by mouth at bedtime., Disp: 30 tablet, Rfl: 0 .  ondansetron (ZOFRAN-ODT) 8 MG disintegrating tablet, Take 1 tablet (8 mg total) by mouth every 8 (eight) hours as needed for nausea or vomiting., Disp: 30 tablet, Rfl: 1 .  pantoprazole (PROTONIX) 40 MG tablet, TK 1 T PO QD, Disp: , Rfl: 5 .  potassium phosphate, monobasic, (K-PHOS ORIGINAL) 500 MG tablet, Take 1 tablet (500 mg total) by mouth 4 (four) times daily -  with meals and at bedtime., Disp:  10 tablet, Rfl: 0 .  predniSONE (DELTASONE) 10 MG tablet, Take 40 mg oral daily for 2 weeks ( until 09/12/17) Then take 30 mg oral daily for next 2 weeks ( 09/13/17- 09/26/17) Then take 20 mg oral daily for next 2 weeks  09/27/17- 10/11/17) Then take 10 mg oral daily for next 2 weeks., Disp: 50 tablet, Rfl: 1 .  PROAIR HFA 108 (90 Base) MCG/ACT inhaler, INL 2 PFS PO Q 4 TO 6 H PRN, Disp: , Rfl: 5 .  promethazine (PHENERGAN) 25 MG suppository, Place 1 suppository (25 mg total) rectally every 6 (six) hours as needed for nausea or vomiting., Disp: 12 each, Rfl: 5 .  senna-docusate (SENOKOT-S) 8.6-50 MG tablet, Take 1 tablet by mouth at bedtime as needed for mild constipation., Disp: 30 tablet, Rfl: 0 .  STIOLTO RESPIMAT 2.5-2.5 MCG/ACT AERS, INL 2 PFS PO QD, Disp: , Rfl: 5 .  sucralfate (CARAFATE) 1 g tablet, Take 1 tablet (1 g total) by mouth 3 (three) times daily., Disp: 90 tablet, Rfl: 1 .  thiamine 100 MG tablet, Take 1 tablet (100 mg total) by mouth daily., Disp: 30 tablet, Rfl: 1 .  vitamin C (VITAMIN C) 500 MG tablet, Take 1 tablet (500 mg total) by mouth 2 (two) times daily., Disp: 60 tablet, Rfl: 1 .  Vitamin D, Ergocalciferol, (DRISDOL) 50000 units CAPS capsule, TK ONE C PO Q WEEK, Disp: , Rfl: 5 .  pregabalin (LYRICA) 50 MG capsule, 50 mg qhs x 1 week, then 50 mg BID week 2, then 50 mg TID thereafter, Disp: 90 capsule, Rfl: 1 .  tiZANidine (ZANAFLEX) 4 MG tablet, Take 1 tablet (4 mg total) by mouth 2 (two) times daily as needed for muscle spasms., Disp: 60 tablet, Rfl: 1 No current facility-administered medications for this visit.   Facility-Administered Medications Ordered in Other Visits:  .  heparin lock flush 100 unit/mL, 500 Units, Intravenous, Once, Corcoran, Melissa C, MD .  heparin lock flush 100 unit/mL, 500 Units, Intravenous, Once, Corcoran, Melissa C, MD .  sodium chloride flush (NS) 0.9 % injection 10 mL, 10 mL, Intravenous, PRN, Mike Gip, Melissa C, MD, 10 mL at 09/10/17  4401  Imaging Review  Cervical Imaging:  Cervical CT wo contrast:  Results for orders placed during the hospital encounter of 06/21/17  CT Cervical Spine Wo Contrast   Narrative CLINICAL DATA:  The patient suffered a syncopal episode with a fall and blow to the left side of the head on 06/19/2017. Blurry vision yesterday. The patient is anticoagulated. History of metastatic small cell carcinoma.  EXAM: CT HEAD WITHOUT CONTRAST  CT CERVICAL SPINE WITHOUT CONTRAST  TECHNIQUE: Multidetector CT imaging of the  head and cervical spine was performed following the standard protocol without intravenous contrast. Multiplanar CT image reconstructions of the cervical spine were also generated.  COMPARISON:  Brain MRI 07/26/2016. Whole-body bone scan 04/10/2017. PET CT scan 07/09/2016, 11/12/2016 and 04/26/2017.  FINDINGS: CT HEAD FINDINGS  Brain: No evidence of acute infarction, hemorrhage, hydrocephalus, extra-axial collection or mass lesion/mass effect. Mild cortical atrophy and chronic microvascular ischemic change are noted.  Vascular: Advanced for age appearing atherosclerosis is identified.  Skull: Intact.  Sinuses/Orbits: Small mastoid effusions are present bilaterally. Paranasal sinuses are clear.  Other: None.  CT CERVICAL SPINE FINDINGS  Alignment: Maintained.  Skull base and vertebrae: No fracture. Abnormal sclerosis is present throughout almost the entire C5 vertebral body which is new since the 07/09/2016 PET CT scan but is seen on the most recent PET CT.  Soft tissues and spinal canal: No prevertebral fluid or swelling. No visible canal hematoma. Carotid atherosclerosis noted.  Disc levels:  Intervertebral disc spaces appear normal.  Upper chest: Emphysematous changes seen in the lung apices.  Other: Negative.  IMPRESSION: No acute abnormality head or cervical spine.  Sclerotic lesion throughout the C5 vertebral body consistent with metastatic disease  is unchanged since the most recent comparison exam.  Mild atrophy and chronic microvascular ischemic change.  Atherosclerosis.   Electronically Signed   By: Inge Rise M.D.   On: 06/21/2017 13:04     Shoulder-R DG:  Results for orders placed during the hospital encounter of 09/10/17  DG Shoulder Right   Narrative CLINICAL DATA:  Trip and fall 3 days ago.  EXAM: RIGHT SHOULDER - 2+ VIEW  COMPARISON:  None.  FINDINGS: The humeral head is well-formed and located. The subacromial, glenohumeral and acromioclavicular joint spaces are intact. No destructive bony lesions. Soft tissue planes are non-suspicious. RIGHT chest Port-A-Cath. Status post median sternotomy. Calcified aortic arch. Faint calcifications RIGHT neck are likely vascular.  IMPRESSION: Negative.  Aortic Atherosclerosis (ICD10-I70.0).   Electronically Signed   By: Elon Alas M.D.   On: 09/10/2017 13:34    Shoulder-L DG: No results found for this or any previous visit.  Thoracic Imaging: Thoracic MR wo contrast: No results found for this or any previous visit. Thoracic MR wo contrast: No procedure found. Thoracic MR w/wo contrast:  Results for orders placed during the hospital encounter of 10/22/16  MR Thoracic Spine W Wo Contrast   Narrative CLINICAL DATA:  Small cell lung cancer with bony metastatic disease. Back pain  EXAM: MRI THORACIC WITHOUT AND WITH CONTRAST  TECHNIQUE: Multiplanar and multiecho pulse sequences of the thoracic spine were obtained without and with intravenous contrast.  CONTRAST:  13 mL MultiHance IV  COMPARISON:  CT chest 09/11/2016, 06/28/2016  FINDINGS: MRI THORACIC SPINE FINDINGS  Alignment:  Normal  Vertebrae: 12 x 15 mm enhancing mass in the T7 vertebral body on the left not extending into the pedicle. This shows sclerotic changes on the most recent chest CT and was not present on CT of 06/28/2016 compatible with metastatic disease. No other  enhancing vertebral body lesions are identified. Scattered areas of fatty signal in the lower vertebral bodies compatible with benign etiology.  Healing fracture right medial tenth rib as noted on CT. This could be due to trauma or metastatic disease.  Cord:  Negative for cord compression.  Cord signal is normal.  Paraspinal and other soft tissues: Negative for soft tissue mass. Negative for pleural effusion.  Disc levels:  Small central disc protrusions at T4-5, T5-6, T7-8, T9-10.  No significant spinal stenosis.  IMPRESSION: Sclerotic enhancing lesion T7 vertebral body on the left compatible with metastatic disease.  Healing fracture right medial tenth rib which could be due to trauma or metastatic disease. On the CT chest 06/28/2016, this appeared to be a benign fracture.  Mild thoracic disc degeneration as above.   Electronically Signed   By: Franchot Gallo M.D.   On: 10/22/2016 09:37      Hip-L DG 2-3 views:  Results for orders placed during the hospital encounter of 09/02/17  DG HIP UNILAT WITH PELVIS 2-3 VIEWS LEFT   Narrative CLINICAL DATA:  LEFT hip pain, history of metastatic lung cancer  EXAM: DG HIP (WITH OR WITHOUT PELVIS) 2-3V LEFT  COMPARISON:  None  FINDINGS: Diffuse osseous demineralization.  Degenerative changes of both hip joints with joint space narrowing and spur formation, greater on LEFT.  No acute fracture, dislocation, or bone destruction.  Scattered atherosclerotic calcifications.  IMPRESSION: Degenerative changes of BILATERAL hip joints, LEFT greater than RIGHT.   Electronically Signed   By: Lavonia Dana M.D.   On: 09/03/2017 07:53      Ankle Imaging: Ankle-R DG Complete:  Results for orders placed in visit on 07/27/14  DG Ankle Complete Right   Ankle-L DG Complete: No results found for this or any previous visit.  Foot Imaging: Foot-R DG Complete:  Results for orders placed during the hospital encounter of 11/18/14   DG Foot Complete Right   Narrative CLINICAL DATA:  Right ankle and proximal foot pain for the past 9 months without known injury, weakness and inability to bear full weight, no report of injury  EXAM: RIGHT FOOT COMPLETE - 3+ VIEW  COMPARISON:  MRI of the foot dated July 30, 2014.  FINDINGS: The bones are mildly osteopenic diffusely. There is no acute or healing or old fracture. The joint spaces are reasonably well-maintained. There is a small plantar calcaneal spur. The soft tissues are unremarkable.  IMPRESSION: Diffuse osteopenia without acute bony abnormality. The osteopenia is is similar to that of disuse. No periosteal reaction is observed.   Electronically Signed   By: David  Martinique M.D.   On: 11/18/2014 12:29    Wrist-L DG Complete:  Results for orders placed during the hospital encounter of 11/15/16  DG Wrist Complete Left   Narrative CLINICAL DATA:  Left wrist infection.  EXAM: LEFT WRIST - COMPLETE 3+ VIEW  COMPARISON:  PET-CT 07/09/2016.  FINDINGS: Diffuse soft tissue swelling.Peripheral vascular calcification. Radiocarpal degenerative change. No acute bony abnormality identified . If osteomyelitis remains a clinical concern MRI of the wrist can be obtained.  IMPRESSION: 1.  Soft tissue swelling.  2.  Peripheral vascular disease.  3. Radiocarpal degenerative change. No acute bony abnormality. No focal bony abnormality.   Electronically Signed   By: Marcello Moores  Register   On: 11/15/2016 15:45     Hand Imaging: Hand-R DG Complete: No results found for this or any previous visit. Hand-L DG Complete: No results found for this or any previous visit.  Complexity Note: Imaging results reviewed. Results shared with Philip [name], using Layman's terms.                         ROS  Cardiovascular: Heart trouble, High blood pressure, Heart attack ( Date: unknown), Heart surgery, Blood thinners:  Anticoagulant and Needs antibiotics prior to dental  procedures Pulmonary or Respiratory: Lung problems, Wheezing and difficulty taking a deep full breath (Asthma), Shortness of breath,  Smoking and Snoring  Neurological: No reported neurological signs or symptoms such as seizures, abnormal skin sensations, urinary and/or fecal incontinence, being born with an abnormal open spine and/or a tethered spinal cord Review of Past Neurological Studies:  Results for orders placed or performed during the hospital encounter of 10/16/17  MR BRAIN W WO CONTRAST   Narrative   CLINICAL DATA:  Lung cancer.  Assessment for metastases.  EXAM: MRI HEAD WITHOUT AND WITH CONTRAST  TECHNIQUE: Multiplanar, multiecho pulse sequences of the brain and surrounding structures were obtained without and with intravenous contrast.  CONTRAST:  48m MULTIHANCE GADOBENATE DIMEGLUMINE 529 MG/ML IV SOLN  COMPARISON:  Brain MRI 08/28/2017 and 07/26/2016  FINDINGS: BRAIN: There is no acute infarct, acute hemorrhage or mass effect. There is a single contrast-enhancing lesion of the superior right frontal cortex, just lateral to the falx cerebri. Lesion measures 6 x 7 mm. There is no associated edema. There are no old infarcts. Diffuse confluent hyperintense T2-weighted signal within the periventricular, deep and juxtacortical white matter, most commonly due to chronic ischemic microangiopathy. This has worsened from prior studies. The CSF spaces are normal for age, with no hydrocephalus. Susceptibility-sensitive sequences show no chronic microhemorrhage or superficial siderosis.  VASCULAR: Major intracranial arterial and venous sinus flow voids are preserved.  SKULL AND UPPER CERVICAL SPINE: The visualized skull base, calvarium, upper cervical spine and extracranial soft tissues are normal.  SINUSES/ORBITS: No fluid levels or advanced mucosal thickening. No mastoid or middle ear effusion. The orbits are normal.  IMPRESSION: 1. Solitary metastatic lesion of the  superior right frontal cortex, adjacent to the falx cerebri measuring 6 x 7 mm. No edema or mass effect. 2. Progression of now severe white matter disease, mildly worse compared to 08/28/2017 but significantly worse from 07/26/2016.   Electronically Signed   By: KUlyses JarredM.D.   On: 10/16/2017 22:27   Results for orders placed or performed during the hospital encounter of 09/10/17  CT Head Wo Contrast   Narrative   CLINICAL DATA:  Initial evaluation for acute trauma, headache.  EXAM: CT HEAD WITHOUT CONTRAST  TECHNIQUE: Contiguous axial images were obtained from the base of the skull through the vertex without intravenous contrast.  COMPARISON:  Prior MRI from 08/28/2017.  FINDINGS: Brain: Generalized age-related cerebral atrophy with chronic small vessel ischemic disease, stable. No acute intracranial hemorrhage. No acute large vessel territory infarct. No mass lesion, midline shift or mass effect. No hydrocephalus. No extra-axial fluid collection.  Vascular: No hyperdense vessel. Scattered vascular calcifications noted within the carotid siphons.  Skull: Scalp soft tissues within normal limits.  Calvarium intact.  Sinuses/Orbits: Visualized globes and orbital soft tissues within normal limits. Visualized paranasal sinuses are clear. Small bilateral mastoid effusions, likely benign.  Other: None.  IMPRESSION: 1. No acute intracranial abnormality. 2. Atrophy with moderate chronic small vessel ischemic disease, stable.   Electronically Signed   By: BJeannine BogaM.D.   On: 09/10/2017 13:41   Results for orders placed or performed during the hospital encounter of 08/26/17  MR BRAIN WO CONTRAST   Narrative   CLINICAL DATA:  57year old male with history of stage IV metastatic lung cancer with headaches, nausea, and vomiting. Evaluate for intracranial metastatic disease.  EXAM: MRI HEAD WITHOUT CONTRAST  TECHNIQUE: Multiplanar, multiecho pulse  sequences of the brain and surrounding structures were obtained without intravenous contrast.  COMPARISON:  Prior head CT from 06/21/2017.  FINDINGS: Brain: Generalized age-related cerebral atrophy. Patchy and confluent T2/FLAIR hyperintensity within the  periventricular white matter most consistent with chronic small vessel ischemic change, moderate nature. Small vessel changes present within the pons as well. 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 No evidence for acute infarct. Gray-white matter differentiation otherwise maintained. No evidence for acute or chronic intracranial hemorrhage.  No mass lesion, midline shift or mass effect. No evidence for intracranial metastasis on this noncontrast examination. No hydrocephalus. No extra-axial fluid collection. Major dural sinuses are grossly patent.  Pituitary gland suprasellar region normal.  Vascular: Major intracranial vascular flow voids are maintained.  Skull and upper cervical spine: Craniocervical junction within normal limits. Upper cervical spine unremarkable. Visualized bone marrow within normal limits. No discrete osseous lesions. No scalp soft tissue abnormality.  Sinuses/Orbits: Globes and orbital soft tissues within normal limits. Paranasal sinuses are largely clear. Bilateral mastoid effusions, likely benign/reactive in nature. Inner ear structures grossly normal.  Other: None.  IMPRESSION: 1. No intracranial metastases identified. Please note evaluation somewhat limited given lack of IV contrast, with limited evaluation for possible small/early metastatic lesions. 2. No other acute intracranial abnormality. 3. 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 4.  Moderate chronic small vessel ischemic change.   Electronically Signed   By: Jeannine Boga M.D.   On: 08/28/2017 13:43    Psychological-Psychiatric: Anxiousness, Depressed, Prone to panicking and Difficulty sleeping and or falling asleep Gastrointestinal: No reported gastrointestinal signs or symptoms such as vomiting or evacuating blood, reflux, heartburn, alternating episodes of diarrhea and constipation, inflamed or scarred liver, or pancreas or irrregular and/or infrequent bowel movements Genitourinary: No reported renal or genitourinary signs or symptoms such as difficulty voiding or producing urine, peeing blood, non-functioning kidney, kidney stones, difficulty emptying the bladder, difficulty controlling the flow of urine, or chronic kidney disease Hematological: Weakness due to low blood hemoglobin or red blood cell count (Anemia), Brusing easily and Bleeding easily Endocrine: No reported endocrine signs or symptoms such as high or low blood sugar, rapid heart rate due to high thyroid levels, obesity or weight gain due to slow thyroid or thyroid disease Rheumatologic: Rheumatoid arthritis Musculoskeletal: Negative for myasthenia gravis, muscular dystrophy, multiple sclerosis or malignant hyperthermia Work History: Disabled  Allergies  Philip [name] is allergic to tylenol [acetaminophen]; amitriptyline; aspirin; contrast media [iodinated diagnostic agents]; nsaids; ibuprofen; and tape.  Laboratory Chemistry  Inflammation Markers (CRP: Acute Phase) (ESR: Chronic Phase) Lab Results  Component Value Date   CRP 1.3 (H) 11/15/2016   ESRSEDRATE 92 (H) 11/15/2016   LATICACIDVEN 0.6 08/26/2017                         Rheumatology Markers Lab Results  Component Value Date   LABURIC 4.2 (L) 06/28/2016                        Renal Function Markers Lab Results  Component Value Date   BUN 8 10/25/2017   CREATININE 0.62 10/25/2017   BCR 5 (L) 06/09/2015   GFRAA >60 10/25/2017    GFRNONAA >60 10/25/2017                             Hepatic Function Markers Lab Results  Component Value Date   AST 22 10/25/2017  ALT 22 10/25/2017   ALBUMIN 2.8 (L) 10/25/2017   ALKPHOS 57 10/25/2017   LIPASE 37 08/26/2017                        Electrolytes Lab Results  Component Value Date   NA 129 (L) 10/25/2017   K 4.4 10/25/2017   CL 96 (L) 10/25/2017   CALCIUM 8.6 (L) 10/25/2017   MG 1.6 (L) 10/18/2017   PHOS 4.6 10/03/2017                        Neuropathy Markers Lab Results  Component Value Date   VITAMINB12 542 08/27/2017   FOLATE 17.0 10/18/2017   HGBA1C 5.1 07/27/2016   HIV Non Reactive 08/27/2017                        Bone Pathology Markers No results found for: [place], [id number], [id number], [id number], 25OHVITD1, 25OHVITD2, 25OHVITD3, TESTOFREE, TESTOSTERONE                       Coagulation Parameters Lab Results  Component Value Date   INR 0.89 08/28/2017   LABPROT 12.0 08/28/2017   APTT 34 07/26/2016   PLT 224 10/18/2017   DDIMER 0.29 07/02/2014                        Cardiovascular Markers Lab Results  Component Value Date   CKTOTAL 48 06/09/2015   TROPONINI <0.03 08/26/2017   HGB 10.9 (L) 10/18/2017   HCT 30.1 (L) 10/18/2017                         CA Markers Lab Results  Component Value Date   CEA 1.7 07/31/2016                        Note: Lab results reviewed.  PFSH  Drug: Philip [name]  reports that he does not use drugs. Alcohol:  reports that he drinks alcohol. Tobacco:  reports that he has been smoking cigarettes.  He has been smoking about 0.25 packs per day. He has never used smokeless tobacco. Medical:  has a past medical history of Anemia, CAD (coronary artery disease), DVT, recurrent, lower extremity, acute, left ([place]) (2016), GIB (gastrointestinal bleeding), Hyperlipidemia, Hypertensive heart disease, Lung cancer ([place]), PAD (peripheral artery disease) ([place]), Tobacco abuse, and Toe amputation status, left ([place])  (07/09/2016). Family: family history includes Breast cancer in his sister; Colon cancer in his paternal grandfather; Heart attack in his brother, brother, and sister; Heart attack (age of onset: 1) in his mother; Hyperlipidemia in his mother; Hypertension in his father; Lung cancer in his father.  Past Surgical History:  Procedure Laterality Date  . CARDIAC CATHETERIZATION N/A 08/24/2014   Procedure: Left Heart Cath and Coronary Angiography;  Surgeon: Isaias Cowman, MD;  Location: [facility];  Service: Cardiovascular;  Laterality: N/A;  . CARDIAC CATHETERIZATION N/A 08/24/2014   Procedure: Coronary Stent Intervention;  Surgeon: Isaias Cowman, MD;  Location: [facility];  Service: Cardiovascular;  Laterality: N/A;  . CORONARY ARTERY BYPASS GRAFT N/A 08/25/2014   Procedure: CORONARY ARTERY BYPASS GRAFTING (CABG), ON PUMP, TIMES THREE, USING BILATERAL MAMMARY ARTERIES, RIGHT GREATER SAPHENOUS VEIN HARVESTED ENDOSCOPICALLY;  Surgeon: Melrose Nakayama, MD;  Location: [facility];  Service: Open Heart Surgery;  Laterality: N/A;  .  ESOPHAGOGASTRODUODENOSCOPY (EGD) WITH PROPOFOL N/A 08/08/2017   Procedure: ESOPHAGOGASTRODUODENOSCOPY (EGD) WITH PROPOFOL;  Surgeon: Jonathon Bellows, MD;  Location: [facility];  Service: Gastroenterology;  Laterality: N/A;  . INCISION AND DRAINAGE ABSCESS Left 11/15/2016   Procedure: INCISION AND DRAINAGE ABSCESS OF LEFT WRIST;  Surgeon: Roseanne Kaufman, MD;  Location: [facility];  Service: Orthopedics;  Laterality: Left;  . PEG PLACEMENT N/A 10/01/2017   Procedure: PERCUTANEOUS ENDOSCOPIC GASTROSTOMY (PEG) PLACEMENT;  Surgeon: Lucilla Lame, MD;  Location: [facility]OSCOPY;  Service: Endoscopy;  Laterality: N/A;  . PERIPHERAL VASCULAR CATHETERIZATION N/A 03/30/2015   Procedure: Abdominal Aortogram w/Lower Extremity;  Surgeon: Wellington Hampshire, MD;  Location: [facility];  Service: Cardiovascular;  Laterality: N/A;  . PORTA CATH INSERTION N/A 07/27/2016    Procedure: Glori Luis Cath Insertion;  Surgeon: Algernon Huxley, MD;  Location: [facility];  Service: Cardiovascular;  Laterality: N/A;  . TEE WITHOUT CARDIOVERSION N/A 08/25/2014   Procedure: TRANSESOPHAGEAL ECHOCARDIOGRAM (TEE);  Surgeon: Melrose Nakayama, MD;  Location: [facility];  Service: Open Heart Surgery;  Laterality: N/A;   Active Ambulatory Problems    Diagnosis Date Noted  . Bilateral foot pain 07/06/2014  . Tobacco use disorder 07/06/2014  . Convulsions ([place]) 07/06/2014  . Preventative health care 07/06/2014  . Right ankle pain 07/20/2014  . History of MI (myocardial infarction) 08/24/2014  . CAD (coronary artery disease), native coronary artery 08/24/2014  . S/P CABG x 3 08/25/2014  . Acute myocardial infarction of inferior wall ([place]) 08/24/2014  . Acute sinusitis, unspecified 09/14/2014  . Hypertension 10/05/2014  . Pain in joint involving ankle and foot 07/20/2014  . Pain in soft tissues of limb 07/06/2014  . Status post aorto-coronary artery bypass graft 08/25/2014  . Pure hypercholesterolemia 10/05/2014  . Rash and other nonspecific skin eruption 09/14/2014  . PAD (peripheral artery disease) ([place]) 03/29/2015  . Hyperlipidemia, unspecified   . Peripheral neuropathy 06/12/2015  . Iron deficiency anemia 01/08/2016  . Lymphadenopathy, mediastinal 07/02/2016  . Weight loss 07/02/2016  . Night sweats 07/02/2016  . 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 Bone metastasis ([place]) 07/12/2016  . Postobstructive pneumonia 07/17/2016  . Goals of care, counseling/discussion 07/26/2016  . Acute CVA (cerebrovascular accident) ([place]) 07/26/2016  . Cancer of upper lobe of left lung ([place]) 07/31/2016  . Cancer related pain 07/31/2016  . Cough 07/31/2016  . Routine history and physical examination of adult 08/09/2016  . Hyponatremia 08/19/2016  . Hypocalcemia 08/19/2016  . Hypomagnesemia 08/19/2016  . Charcot's joint of foot (Right) 08/21/2016  . Chronic foot pain (Left)  03/21/2016  . Chronic pain syndrome 03/21/2016  . Dry gangrene ([place]) 01/02/2016  . GERD (gastroesophageal reflux disease) 08/21/2016  . Myocardial infarction ([place]) 08/21/2016  . Neuropathic pain 03/21/2016  . Osteopenia of both feet 12/10/2015  . Encounter for antineoplastic chemotherapy 09/10/2016  . Chronic thoracic spine pain 10/28/2016  . Antineoplastic chemotherapy induced anemia 10/28/2016  . Abscess of left hand 11/13/2016  . Non-intractable vomiting with nausea 01/20/2017  . Hypokalemia 01/20/2017  . Dehydration 01/20/2017  . Skin abscess 04/17/2017  . Lung cancer metastatic to bone ([place]) 04/24/2017  . 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 Marijuana user 04/24/2017  . Abnormal drug screen 04/24/2017  . Weakness 08/02/2017  . Intractable nausea and vomiting 08/27/2017  . Protein-calorie malnutrition, severe 08/27/2017  . Adrenal insufficiency ([place]) 09/02/2017  . Folate deficiency 09/10/2017  . Metastasis to brain ([place]) 10/18/2017  . White matter disease 10/18/2017   Resolved  Ambulatory Problems    Diagnosis Date Noted  . Elevated blood pressure 07/06/2014  . Coronary artery disease involving native coronary artery of native heart with unstable angina pectoris ([place]) 08/24/2014  . Sinusitis, acute 09/14/2014  . Maculopapular rash 09/14/2014  . Acute MI, inferior wall, initial episode of care ([place]) 10/08/2014  . Seizure ([place]) 07/06/2014  . CAD in native artery 08/24/2014  . Atherosclerosis of coronary artery 08/24/2014  . Elevated blood-pressure reading without diagnosis of hypertension 07/06/2014  . Encounter for general adult medical examination without abnormal findings 07/06/2014  . Compulsive tobacco user syndrome 07/06/2014  . Hypertensive heart disease    Past Medical History:  Diagnosis Date  . Anemia   . CAD (coronary artery disease)   . DVT, recurrent, lower extremity, acute, left ([place]) 2016  . GIB (gastrointestinal bleeding)   . Hyperlipidemia   .  Hypertensive heart disease   . Lung cancer ([place])   . PAD (peripheral artery disease) ([place])   . Tobacco abuse   . Toe amputation status, left ([place]) 07/09/2016   Constitutional Exam  General appearance: alert, cooperative and cachectic Vitals:   10/31/17 1256  BP: (!) 135/98  Pulse: (!) 103  Resp: 16  Temp: 98.3 F (36.8 C)  TempSrc: Oral  SpO2: 100%  Weight: 114 lb (51.7 kg)  Height: '5\' 11"'  (1.803 m)   BMI Assessment: Estimated body mass index is 15.9 kg/m as calculated from the following:   Height as of this encounter: '5\' 11"'  (1.803 m).   Weight as of this encounter: 114 lb (51.7 kg).  BMI interpretation table: BMI level Category Range association with higher incidence of chronic pain  <18 kg/m2 Underweight   18.5-24.9 kg/m2 Ideal body weight   25-29.9 kg/m2 Overweight Increased incidence by 20%  30-34.9 kg/m2 Obese (Class I) Increased incidence by 68%  35-39.9 kg/m2 Severe obesity (Class II) Increased incidence by 136%  >40 kg/m2 Extreme obesity (Class III) Increased incidence by 254%   Patient's current BMI Ideal Body weight  Body mass index is 15.9 kg/m. Ideal body weight: 75.3 kg (166 lb 0.1 oz)   BMI Readings from Last 4 Encounters:  10/31/17 15.90 kg/m  10/18/17 16.51 kg/m  10/03/17 16.39 kg/m  09/23/17 15.62 kg/m   Wt Readings from Last 4 Encounters:  10/31/17 114 lb (51.7 kg)  10/18/17 118 lb 6.4 oz (53.7 kg)  10/03/17 117 lb 8 oz (53.3 kg)  09/23/17 112 lb (50.8 kg)  Psych/Mental status: Alert, oriented x 3 (person, place, & time)       Eyes: PERLA Respiratory: No evidence of acute respiratory distress  Cervical Spine Area Exam  Skin & Axial Inspection: No masses, redness, edema, swelling, or associated skin lesions Alignment: Symmetrical Functional ROM: Unrestricted ROM      Stability: No instability detected Muscle Tone/Strength: Functionally intact. No obvious neuro-muscular anomalies detected. Sensory (Neurological): Unimpaired Palpation:  No palpable anomalies              Upper Extremity (UE) Exam    Side: Right upper extremity  Side: Left upper extremity  Skin & Extremity Inspection: Skin color, temperature, and hair growth are WNL. No peripheral edema or cyanosis. No masses, redness, swelling, asymmetry, or associated skin lesions. No contractures.  Skin & Extremity Inspection: Skin color, temperature, and hair growth are WNL. No peripheral edema or cyanosis. No masses, redness, swelling, asymmetry, or associated skin lesions. No contractures.  Functional ROM: Unrestricted ROM          Functional  ROM: Unrestricted ROM          Muscle Tone/Strength: Functionally intact. No obvious neuro-muscular anomalies detected.  Muscle Tone/Strength: Functionally intact. No obvious neuro-muscular anomalies detected.  Sensory (Neurological): Unimpaired          Sensory (Neurological): Unimpaired          Palpation: No palpable anomalies              Palpation: No palpable anomalies              Provocative Test(s):  Phalen's test: deferred Tinel's test: deferred Apley's scratch test (touch opposite shoulder):  Action 1 (Across chest): deferred Action 2 (Overhead): deferred Action 3 (LB reach): deferred   Provocative Test(s):  Phalen's test: deferred Tinel's test: deferred Apley's scratch test (touch opposite shoulder):  Action 1 (Across chest): deferred Action 2 (Overhead): deferred Action 3 (LB reach): deferred    Thoracic Spine Area Exam  Skin & Axial Inspection: No masses, redness, or swelling Alignment: Symmetrical Functional ROM: Unrestricted ROM Stability: No instability detected Muscle Tone/Strength: Functionally intact. No obvious neuro-muscular anomalies detected. Sensory (Neurological): Musculoskeletal pain pattern Muscle strength & Tone: Complains of area being tender to palpation  Lumbar Spine Area Exam  Skin & Axial Inspection: No masses, redness, or swelling Alignment: Symmetrical Functional ROM: Decreased ROM        Stability: No instability detected Muscle Tone/Strength: Functionally intact. No obvious neuro-muscular anomalies detected. Sensory (Neurological): Musculoskeletal pain pattern Palpation: Complains of area being tender to palpation       Provocative Tests: Lumbar Hyperextension/rotation test: deferred today       Lumbar quadrant test (Kemp's test): deferred today       Lumbar Lateral bending test: deferred today       Patrick's Maneuver: deferred today                   FABER test: deferred today                   Thigh-thrust test: deferred today       S-I compression test: deferred today       S-I distraction test: deferred today        Gait & Posture Assessment  Ambulation: Patient came in today in a wheel chair Gait: Limited. Using assistive device to ambulate Posture: Difficulty standing up straight, due to pain   Lower Extremity Exam    Side: Right lower extremity  Side: Left lower extremity  Stability: No instability observed          Stability: No instability observed          Skin & Extremity Inspection: Skin color, temperature, and hair growth are WNL. No peripheral edema or cyanosis. No masses, redness, swelling, asymmetry, or associated skin lesions. No contractures.  Skin & Extremity Inspection: Skin color, temperature, and hair growth are WNL. No peripheral edema or cyanosis. No masses, redness, swelling, asymmetry, or associated skin lesions. No contractures.  Functional ROM: Unrestricted ROM                  Functional ROM: Unrestricted ROM                  Muscle Tone/Strength: Functionally intact. No obvious neuro-muscular anomalies detected.  Muscle Tone/Strength: Functionally intact. No obvious neuro-muscular anomalies detected.  Sensory (Neurological): Unimpaired  Sensory (Neurological): Unimpaired  Palpation: No palpable anomalies  Palpation: No palpable anomalies   Assessment  Primary Diagnosis & Pertinent Problem List: The  primary encounter diagnosis was  Chronic pain syndrome. Diagnoses of Chronic thoracic spine pain, Neuropathic pain, S/P CABG x 3, History of MI (myocardial infarction), Bilateral foot pain, Charcot's joint of foot (Right), Lung cancer metastatic to bone [facility]), and Cancer of upper lobe of left lung ([place]) were also pertinent to this visit.  Visit Diagnosis (New problems to examiner): 1. Chronic pain syndrome   2. Chronic thoracic spine pain   3. Neuropathic pain   4. S/P CABG x 3   5. History of MI (myocardial infarction)   6. Bilateral foot pain   7. Charcot's joint of foot (Right)   8. Lung cancer metastatic to bone ([place])   9. Cancer of upper lobe of left lung ([place])    General Recommendations: The pain condition that the patient suffers from is best treated with a multidisciplinary approach that involves an increase in physical activity to prevent de-conditioning and worsening of the pain cycle, as well as psychological counseling (formal and/or informal) to address the co-morbid psychological affects of pain. Treatment will often involve judicious use of pain medications and interventional procedures to decrease the pain, allowing the patient to participate in the physical activity that will ultimately produce long-lasting pain reductions. The goal of the multidisciplinary approach is to return the patient to a higher level of overall function and to restore their ability to perform activities of daily living.  57 year old male with a history of chronic pain secondary to lung cancer status post chemoradiation now with relapse.  Patient was obtaining chronic opioid therapy from his oncologist but had a urine drug screen that was positive for [facility] and was discharged from his oncology practice for pain management.  Patient does have a history of peripheral arterial disease as well as coronary artery disease and has left popliteal stent in place.  Patient does have a history of chronic tobacco abuse along with a family history of lung  cancer.  Patient follows up today for consideration of chronic opioid therapy in the context of relapse of his lung cancer.Patient has previously tried gabapentin, amitriptyline, nortriptyline along with various NSAIDs.  Gabapentin resulted in side effects of sedation and vivid dreams.  He also had cognitive side effects with amitriptyline and NSAID made him vomit.  Patient has lost a significant amount of weight given his malignancy.  Patient does utilize marijuana for appetite stimulation and for nausea management.  His oxycodone dose is 5 mg 3 times daily to 4 times daily as needed.    I had an extensive discussion with the patient about our clinic policy.  While THC and medical marijuana can have some utility in appetite stimulation and nausea management, I explained that I cannot prescribe him a controlled substance when he is utilizing an illicit substance that is deemed illegal by the federal government.  To be considered for chronic opioid therapy, the patient will need to provide a urine drug toxicology screen that is negative for THC.  After that, patient can be considered for chronic opioid therapy.  We can consider current regimen of oxycodone 5 mg 3 times daily to 4 times daily or other alternatives such as the buprenorphine patch, hydrocodone, tapentadol.  In regards to non-opioid analgesics, I recommended the patient start Lyrica 50 mg nightly increase to 50 mill grams twice daily on week 2 and then increase to 50 mill grams 3 times daily on week 3 to help with his pain.  Patient also endorses spasms of his mid thoracic spine.  I will  prescribe the patient tizanidine which he can take on an as-needed basis when he has muscle spasms.  Plan: -UDS today.  Should be positive for THC.  Will repeat UDS at next visit in 3 weeks which should hopefully be negative for THC. -Prescription for Lyrica and tizanidine as below  Future considerations:  Cymbalta, Effexor,  Oxycodone as previously  prescribed, hydrocodone, buprenorphine patch, tramadol, tapentadol  Note: Please be advised that as per protocol, today's visit has been an evaluation only. We have not taken over the patient's controlled substance management.   Ordered Lab-work, Procedure(s), Referral(s), & Consult(s): Orders Placed This Encounter  Procedures  . Compliance Drug Analysis, Ur   Pharmacotherapy (current): Medications ordered:  Meds ordered this encounter  Medications  . pregabalin (LYRICA) 50 MG capsule    Sig: 50 mg qhs x 1 week, then 50 mg BID week 2, then 50 mg TID thereafter    Dispense:  90 capsule    Refill:  1    Do not place this medication, or any other prescription from our practice, on "Automatic Refill". Patient may have prescription filled one day early if pharmacy is closed on scheduled refill date.  Marland Kitchen tiZANidine (ZANAFLEX) 4 MG tablet    Sig: Take 1 tablet (4 mg total) by mouth 2 (two) times daily as needed for muscle spasms.    Dispense:  60 tablet    Refill:  1   Medications administered during this visit: Brycin Kille. Scull had no medications administered during this visit.   Pharmacological management options:  Opioid Analgesics: The patient was informed that there is no guarantee that he would be a candidate for opioid analgesics. The decision will be made following CDC guidelines. This decision will be based on the results of diagnostic studies, as well as Mr. Noe risk profile.   Membrane stabilizer: Has tried and failed gabapentin and amitriptyline.  Trial of Lyrica today.  Can consider Cymbalta or Effexor in the future  Muscle relaxant: To be determined at a later time trial of tizanidine today.  Can consider baclofen, Robaxin, Flexeril in the future  NSAID: Medically contraindicated  Other analgesic(s): To be determined at a later time   Provider-requested follow-up: Return in about 3 weeks (around 11/21/2017) for Medication Management.  Future Appointments  Date Time  Provider [place]  11/01/2017  9:00 AM [id number] None  11/04/2017  9:00 AM [id number] None  11/07/2017  8:15 AM [facility] None  11/07/2017  8:45 AM Lequita Asal, MD [facility] None  11/07/2017  9:15 AM [facility]FUSION [facility] 6 [facility] None  11/07/2017 12:00 PM Jennet Maduro, RD [facility] None  11/27/2017 12:45 PM Gillis Santa, MD [facility] None    Primary Care Physician: Lorelee Market, MD Location: [facility] Note by: Gillis Santa, M.D, Date: 10/31/2017; Time: 3:58 PM  Patient Instructions  You were given one prescription for Lyrica today.  A prescription for Tizanidine was sent to your pharmacy.

## 2017-10-31 NOTE — Progress Notes (Signed)
Safety precautions to be maintained throughout the outpatient stay will include: orient to surroundings, keep bed in low position, maintain call bell within reach at all times, provide assistance with transfer out of bed and ambulation.  

## 2017-11-01 ENCOUNTER — Other Ambulatory Visit: Payer: Medicaid Other

## 2017-11-01 ENCOUNTER — Ambulatory Visit: Payer: Medicaid Other | Admitting: Urgent Care

## 2017-11-01 ENCOUNTER — Ambulatory Visit: Payer: Medicaid Other

## 2017-11-01 ENCOUNTER — Ambulatory Visit
Admission: RE | Admit: 2017-11-01 | Discharge: 2017-11-01 | Disposition: A | Discharge status: Home / Self Care | Payer: Medicaid Other | Source: Ambulatory Visit | Attending: Radiation Oncology | Admitting: Radiation Oncology

## 2017-11-01 DIAGNOSIS — Z510 Encounter for antineoplastic radiation therapy: Secondary | ICD-10-CM | POA: Diagnosis not present

## 2017-11-04 ENCOUNTER — Other Ambulatory Visit: Payer: Self-pay | Admitting: *Deleted

## 2017-11-04 ENCOUNTER — Ambulatory Visit
Admission: RE | Admit: 2017-11-04 | Discharge: 2017-11-04 | Disposition: A | Discharge status: Home / Self Care | Payer: Medicaid Other | Source: Ambulatory Visit | Attending: Radiation Oncology | Admitting: Radiation Oncology

## 2017-11-04 ENCOUNTER — Telehealth: Payer: Self-pay | Admitting: Student in an Organized Health Care Education/Training Program

## 2017-11-04 DIAGNOSIS — Z510 Encounter for antineoplastic radiation therapy: Secondary | ICD-10-CM | POA: Diagnosis not present

## 2017-11-04 MED ORDER — DEXAMETHASONE 4 MG PO TABS: 4.0000 mg | ORAL_TABLET | Freq: Every day | ORAL | 0 refills | Status: DC

## 2017-11-04 NOTE — Telephone Encounter (Signed)
Patient's insurance will not pay for Lyrica, is there a different medicine he can try. Please call patient.

## 2017-11-05 ENCOUNTER — Telehealth: Payer: Self-pay | Admitting: Student in an Organized Health Care Education/Training Program

## 2017-11-05 ENCOUNTER — Ambulatory Visit: Payer: Medicaid Other

## 2017-11-05 LAB — COMPLIANCE DRUG ANALYSIS, UR

## 2017-11-05 NOTE — Telephone Encounter (Signed)
Left message with patient to call to let me know if there any other medications that have been tried and faiiled that are like [place].

## 2017-11-05 NOTE — Telephone Encounter (Signed)
Patient rcvd msg from our office asking if he has taken anything like Lyrica before, he says he has not. Medicaid will not authorize to fill

## 2017-11-05 NOTE — Telephone Encounter (Signed)
I submitted a PA request for Lyrica yesterday.

## 2017-11-06 ENCOUNTER — Other Ambulatory Visit: Payer: Self-pay | Admitting: *Deleted

## 2017-11-06 DIAGNOSIS — C3490 Malignant neoplasm of unspecified part of unspecified bronchus or lung: Secondary | ICD-10-CM

## 2017-11-07 ENCOUNTER — Inpatient Hospital Stay: Payer: Medicaid Other

## 2017-11-07 ENCOUNTER — Inpatient Hospital Stay (HOSPITAL_BASED_OUTPATIENT_CLINIC_OR_DEPARTMENT_OTHER): Payer: Medicaid Other | Admitting: Hematology and Oncology

## 2017-11-07 ENCOUNTER — Encounter: Payer: Self-pay | Admitting: Hematology and Oncology

## 2017-11-07 ENCOUNTER — Other Ambulatory Visit: Payer: Self-pay | Admitting: Hematology and Oncology

## 2017-11-07 ENCOUNTER — Telehealth: Payer: Self-pay | Admitting: *Deleted

## 2017-11-07 VITALS — BP 120/79 | HR 80 | Temp 96.5°F | Resp 16 | Wt 111.3 lb

## 2017-11-07 DIAGNOSIS — Z79899 Other long term (current) drug therapy: Secondary | ICD-10-CM

## 2017-11-07 DIAGNOSIS — C7931 Secondary malignant neoplasm of brain: Secondary | ICD-10-CM | POA: Diagnosis not present

## 2017-11-07 DIAGNOSIS — Z7952 Long term (current) use of systemic steroids: Secondary | ICD-10-CM

## 2017-11-07 DIAGNOSIS — G893 Neoplasm related pain (acute) (chronic): Secondary | ICD-10-CM | POA: Diagnosis not present

## 2017-11-07 DIAGNOSIS — F17210 Nicotine dependence, cigarettes, uncomplicated: Secondary | ICD-10-CM

## 2017-11-07 DIAGNOSIS — C7951 Secondary malignant neoplasm of bone: Secondary | ICD-10-CM | POA: Diagnosis not present

## 2017-11-07 DIAGNOSIS — Z7189 Other specified counseling: Secondary | ICD-10-CM

## 2017-11-07 DIAGNOSIS — D649 Anemia, unspecified: Secondary | ICD-10-CM

## 2017-11-07 DIAGNOSIS — D6481 Anemia due to antineoplastic chemotherapy: Secondary | ICD-10-CM

## 2017-11-07 DIAGNOSIS — C3490 Malignant neoplasm of unspecified part of unspecified bronchus or lung: Secondary | ICD-10-CM

## 2017-11-07 DIAGNOSIS — R911 Solitary pulmonary nodule: Secondary | ICD-10-CM

## 2017-11-07 DIAGNOSIS — M25512 Pain in left shoulder: Secondary | ICD-10-CM

## 2017-11-07 DIAGNOSIS — E538 Deficiency of other specified B group vitamins: Secondary | ICD-10-CM

## 2017-11-07 DIAGNOSIS — R5383 Other fatigue: Secondary | ICD-10-CM

## 2017-11-07 DIAGNOSIS — E871 Hypo-osmolality and hyponatremia: Secondary | ICD-10-CM

## 2017-11-07 DIAGNOSIS — C3412 Malignant neoplasm of upper lobe, left bronchus or lung: Secondary | ICD-10-CM

## 2017-11-07 DIAGNOSIS — I119 Hypertensive heart disease without heart failure: Secondary | ICD-10-CM

## 2017-11-07 DIAGNOSIS — I2510 Atherosclerotic heart disease of native coronary artery without angina pectoris: Secondary | ICD-10-CM

## 2017-11-07 DIAGNOSIS — M549 Dorsalgia, unspecified: Secondary | ICD-10-CM

## 2017-11-07 DIAGNOSIS — E2740 Unspecified adrenocortical insufficiency: Secondary | ICD-10-CM

## 2017-11-07 DIAGNOSIS — Z7902 Long term (current) use of antithrombotics/antiplatelets: Secondary | ICD-10-CM

## 2017-11-07 DIAGNOSIS — R531 Weakness: Secondary | ICD-10-CM

## 2017-11-07 DIAGNOSIS — T451X5S Adverse effect of antineoplastic and immunosuppressive drugs, sequela: Secondary | ICD-10-CM

## 2017-11-07 DIAGNOSIS — I739 Peripheral vascular disease, unspecified: Secondary | ICD-10-CM

## 2017-11-07 DIAGNOSIS — Z951 Presence of aortocoronary bypass graft: Secondary | ICD-10-CM

## 2017-11-07 DIAGNOSIS — E43 Unspecified severe protein-calorie malnutrition: Secondary | ICD-10-CM

## 2017-11-07 DIAGNOSIS — I509 Heart failure, unspecified: Secondary | ICD-10-CM

## 2017-11-07 DIAGNOSIS — I252 Old myocardial infarction: Secondary | ICD-10-CM

## 2017-11-07 DIAGNOSIS — R5381 Other malaise: Secondary | ICD-10-CM

## 2017-11-07 DIAGNOSIS — R110 Nausea: Secondary | ICD-10-CM

## 2017-11-07 DIAGNOSIS — Z9181 History of falling: Secondary | ICD-10-CM

## 2017-11-07 DIAGNOSIS — E785 Hyperlipidemia, unspecified: Secondary | ICD-10-CM

## 2017-11-07 DIAGNOSIS — G894 Chronic pain syndrome: Secondary | ICD-10-CM

## 2017-11-07 LAB — CBC WITH DIFFERENTIAL/PLATELET
Basophils Absolute: 0 10*3/uL (ref 0–0.1)
Basophils Relative: 0 %
Eosinophils Absolute: 0 10*3/uL (ref 0–0.7)
Eosinophils Relative: 0 %
HCT: 34.9 % — ABNORMAL LOW (ref 40.0–52.0)
Hemoglobin: 12.6 g/dL — ABNORMAL LOW (ref 13.0–18.0)
Lymphocytes Relative: 18 %
Lymphs Abs: 0.9 10*3/uL — ABNORMAL LOW (ref 1.0–3.6)
MCH: 36.7 pg — ABNORMAL HIGH (ref 26.0–34.0)
MCHC: 36.1 g/dL — ABNORMAL HIGH (ref 32.0–36.0)
MCV: 101.6 fL — ABNORMAL HIGH (ref 80.0–100.0)
Monocytes Absolute: 0.3 10*3/uL (ref 0.2–1.0)
Monocytes Relative: 7 %
Neutro Abs: 3.7 10*3/uL (ref 1.4–6.5)
Neutrophils Relative %: 75 %
Platelets: 155 10*3/uL (ref 150–440)
RBC: 3.43 MIL/uL — ABNORMAL LOW (ref 4.40–5.90)
RDW: 14 % (ref 11.5–14.5)
WBC: 4.9 10*3/uL (ref 3.8–10.6)

## 2017-11-07 LAB — COMPREHENSIVE METABOLIC PANEL
ALT: 20 U/L (ref 0–44)
AST: 17 U/L (ref 15–41)
Albumin: 3.1 g/dL — ABNORMAL LOW (ref 3.5–5.0)
Alkaline Phosphatase: 59 U/L (ref 38–126)
Anion gap: 10 (ref 5–15)
BUN: 7 mg/dL (ref 6–20)
CO2: 23 mmol/L (ref 22–32)
Calcium: 8.6 mg/dL — ABNORMAL LOW (ref 8.9–10.3)
Chloride: 94 mmol/L — ABNORMAL LOW (ref 98–111)
Creatinine, Ser: 0.63 mg/dL (ref 0.61–1.24)
GFR calc Af Amer: >60 mL/min (ref >60)
GFR calc non Af Amer: >60 mL/min (ref >60)
Glucose, Bld: 90 mg/dL (ref 70–99)
Potassium: 3.7 mmol/L (ref 3.5–5.1)
Sodium: 127 mmol/L — ABNORMAL LOW (ref 135–145)
Total Bilirubin: 1.1 mg/dL (ref 0.3–1.2)
Total Protein: 5.5 g/dL — ABNORMAL LOW (ref 6.5–8.1)

## 2017-11-07 LAB — MAGNESIUM: Magnesium: 1.8 mg/dL (ref 1.7–2.4)

## 2017-11-07 MED ORDER — AZITHROMYCIN 250 MG PO TABS: ORAL_TABLET | ORAL | 0 refills | Status: DC

## 2017-11-07 MED ORDER — HEPARIN SOD (PORK) LOCK FLUSH 100 UNIT/ML IV SOLN
INTRAVENOUS | Status: AC
  Filled 2017-11-07: qty 5

## 2017-11-07 MED ORDER — HEPARIN SOD (PORK) LOCK FLUSH 100 UNIT/ML IV SOLN
500.0000 [IU] | Freq: Once | INTRAVENOUS | Status: AC
  Administered 2017-11-07: 500 [IU] via INTRAVENOUS

## 2017-11-07 MED ORDER — SODIUM CHLORIDE 1 G PO TABS: 1.0000 g | ORAL_TABLET | Freq: Two times a day (BID) | ORAL | 0 refills | Status: DC

## 2017-11-07 NOTE — Telephone Encounter (Signed)
-----   Message from Karen Kitchens, NP sent at 11/07/2017 10:54 AM EDT ----- Regarding: call Call patient and advised that Na is low. Will start sodium tabs BID, with plans to recheck at next visit. Rx sent in.  Gaspar Bidding

## 2017-11-07 NOTE — Telephone Encounter (Signed)
Called patient to inform him that his sodium is a little low. Sodium pills have been called to his pharmacy.  Will recheck sodium levels at next visit.  Also, Anderson Malta, RN had called him earlier today about his Lyrica and trying to obtain patient financial assistance.  Patient was instructed to contact Elease Etienne our social worker next week to fill out application for the assistance.  Patient was notified that Gaspar Bidding, NP has checked with patient's insurance and the medication will only be $3 per prescription with prior authorization.  Patient was instructed to call the provider who prescribed the Lyrica and ask them to send in [place]. Patient was appreciative of call.

## 2017-11-07 NOTE — Progress Notes (Signed)
Pt in today for follow up, reports no appetite and having pain "all over".  Reports was prescribed lyrica by pain clinic but "I can not afford it".

## 2017-11-07 NOTE — Progress Notes (Signed)
[facility] day:  11/07/2017  Chief Complaint: Philip [name] is a 57 y.o. male with extensive stage small cell lung cancer who is seen for reassessment after interval CNS radiation.  HPI:  The patient was last seen in the medical oncology clinic on 10/18/2017.  At that time, he felt a little better.  He was gaining weight slowly.  He denied any headache.  He had generalized pain, controlled. Hemoglobin was 10.9.  Folate was 17 (normal).  B1 and vitamin C were normal.  Sodium was 128 and magnesium was 1.6.  He received 1 gm of magnesium.  Head MRI had revealed small isolated brain metastasis.  He was started on Decadron 4 mg a day.  He was referred to radiation oncology.  He was referred to Dr. Melrose Nakayama for white matter changes.  PET scan on 10/18/2017 revealed a new small nodular density within the anteromedial left upper lobe (SUV 2.4) which is suspicious for metastatic disease.  There are scattered sclerotic bone lesions without corresponding FDG uptake to suggest metabolically active bone disease.  He received oxycodone 5 mg po q 6 hours prn pain (disp #30) as he was awating pain management assessment.  Subsequently, urine drug screen became available after appointment and was + cannabinoids.  No opiates.  Blood drug screen was (+) for carboxy-THC (133 ng/mg) and noroxycodone (98 ng/mg).  He returned to clinic on 10/25/2017 requesting pain medications.  Medications denied secondary to his UDS being (+) for THC.  He was told to discontinue THC as he had 2 scheduled pain clinic appointments (10/28/2017 and 10/31/2017).  He canceled his appointment on 10/28/2017 with [facility] on 10/28/2017.  He had an appointment with Dr. Gillis Santa on 10/31/2017.  He was seen by Dr. Marvene Staff, radiation oncologist, on 10/21/2017.  Discussions were held regarding a course of whole brain irradiation--22000 cGy in 10 fractions.  He was scheduled  for simulation the following day.  Patient presents today reporting that his pain doctor called in a prescription for Lyrica, however it was over $600. He is scheduled to see pain management on 11/27/2017, at which time further assessment and interventions will take place. Patient is not using marijuana. He states, "He told me not to use it. I will do whatever he says".   Patient notes that his radiation treatments made him sick. He began radiation treatments on 10/23/2017 and completed on 11/04/2017. He is currently being taper off of his Decadron. He noted that he is starting to eat some better now that radiation has completed. Weight today is 111 lb 5 oz (50.5 kg), which represents a 7 pound decrease.    Past Medical History:  Diagnosis Date  . Anemia   . CAD (coronary artery disease)    a. 08/2014 Inf STEMI/CABG x 3 (LIMA->LAD, VG->Diag, RIMA->RCA).  . DVT, recurrent, lower extremity, acute, left ([place]) 2016  . GIB (gastrointestinal bleeding)    a. In the setting of DAPT-->tolerating plavix only.  . Hyperlipidemia   . Hypertensive heart disease   . Lung cancer ([place])    a. s/p chemo/radiation.  Marland Kitchen PAD (peripheral artery disease) ([place])    a. 03/2015 Periph Angio: short occlusion of L pop w/ evidence of embolization into the DP->5.0x50 mm Inova self-expanding stent;  b. 04/08/2015 ABI: R 1.12, L 0.99; c. 01/2017 LE Duplex: bilat SFA obstructive dzs, patent L pop.  . Tobacco abuse   . Toe amputation status, left ([place]) 07/09/2016  2017    Past Surgical History:  Procedure Laterality Date  . CARDIAC CATHETERIZATION N/A 08/24/2014   Procedure: Left Heart Cath and Coronary Angiography;  Surgeon: Isaias Cowman, MD;  Location: [facility];  Service: Cardiovascular;  Laterality: N/A;  . CARDIAC CATHETERIZATION N/A 08/24/2014   Procedure: Coronary Stent Intervention;  Surgeon: Isaias Cowman, MD;  Location: [facility];  Service: Cardiovascular;  Laterality: N/A;  .  CORONARY ARTERY BYPASS GRAFT N/A 08/25/2014   Procedure: CORONARY ARTERY BYPASS GRAFTING (CABG), ON PUMP, TIMES THREE, USING BILATERAL MAMMARY ARTERIES, RIGHT GREATER SAPHENOUS VEIN HARVESTED ENDOSCOPICALLY;  Surgeon: Melrose Nakayama, MD;  Location: [facility];  Service: Open Heart Surgery;  Laterality: N/A;  . ESOPHAGOGASTRODUODENOSCOPY (EGD) WITH PROPOFOL N/A 08/08/2017   Procedure: ESOPHAGOGASTRODUODENOSCOPY (EGD) WITH PROPOFOL;  Surgeon: Jonathon Bellows, MD;  [facility];  Service: Gastroenterology;  Laterality: N/A;  . INCISION AND DRAINAGE ABSCESS Left 11/15/2016   Procedure: INCISION AND DRAINAGE ABSCESS OF LEFT WRIST;  Surgeon: Roseanne Kaufman, MD;  Location: [facility];  Service: Orthopedics;  Laterality: Left;  . PEG PLACEMENT N/A 10/01/2017   Procedure: PERCUTANEOUS ENDOSCOPIC GASTROSTOMY (PEG) PLACEMENT;  Surgeon: Lucilla Lame, MD;  Location: [facility] ENDOSCOPY;  Service: Endoscopy;  Laterality: N/A;  . PERIPHERAL VASCULAR CATHETERIZATION N/A 03/30/2015   Procedure: Abdominal Aortogram w/Lower Extremity;  Surgeon: Wellington Hampshire, MD;  Location: [facility];  Service: Cardiovascular;  Laterality: N/A;  . PORTA CATH INSERTION N/A 07/27/2016   Procedure: Glori Luis Cath Insertion;  Surgeon: Algernon Huxley, MD;  Location: [facility];  Service: Cardiovascular;  Laterality: N/A;  . TEE WITHOUT CARDIOVERSION N/A 08/25/2014   Procedure: TRANSESOPHAGEAL ECHOCARDIOGRAM (TEE);  Surgeon: Melrose Nakayama, MD;  Location: [facility];  Service: Open Heart Surgery;  Laterality: N/A;    Family History  Problem Relation Age of Onset  . Hypertension Father   . Lung cancer Father   . Heart attack Mother 60       STENT  . Hyperlipidemia Mother   . Heart attack Brother   . Heart attack Brother   . Breast cancer Sister   . Heart attack Sister   . Colon cancer Paternal Grandfather     Social History:  reports that he has been smoking cigarettes.  He has been smoking about 0.25 packs per day. He  has never used smokeless tobacco. He reports that he drinks alcohol. He reports that he does not use drugs.  The patient smokes 1/2 pack of cigarettes/day (previously 2 1/2 ppd).  He started smoking in his teens.  He denies any exposure to radiation or toxins.  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  He lives in [place].  His wife is named Publishing copy.  He is accompanied by his wife today.  Allergies:  Allergies  Allergen Reactions  . Tylenol [Acetaminophen] Palpitations  . Amitriptyline Other (See Comments)    Burning sensation all over  . Aspirin   . Contrast Media [Iodinated Diagnostic Agents] Other (See Comments)    Pt states that he got "knots behind his ears"  . Nsaids Other (See Comments)    Blood in stools  . Ibuprofen Other (See Comments) and Nausea Only    Blood in stools  . Tape Rash and Other (See Comments)    PLEASE USE COBAN WRAP; THE PATIENT'S SKIN TEARS WHEN BANDAGES ARE REMOVED!!    Current Medications: Current Outpatient Medications  Medication Sig Dispense Refill  . atorvastatin (LIPITOR)  40 MG tablet TAKE 1 TABLET BY MOUTH DAILY 90 tablet 3  . Calcium Carb-Cholecalciferol (CALCIUM + D3 PO) Take 1 tablet by mouth 3 (three) times daily.    . clopidogrel (PLAVIX) 75 MG tablet Take 1 tablet (75 mg total) by mouth daily. 90 tablet 3  . dexamethasone (DECADRON) 4 MG tablet Take 1 tablet (4 mg total) by mouth daily. Take 1/2 tablet x 7 days, then 1/2 tablet every other day until finished. 7 tablet 0  . diphenhydrAMINE (BENADRYL) 25 mg capsule Take 25 mg by mouth 2 (two) times daily as needed for allergies.     . famotidine (PEPCID) 20 MG tablet Take 1 tablet (20 mg total) by mouth 2 (two) times daily. 60 tablet 0  . feeding supplement, ENSURE ENLIVE, (ENSURE ENLIVE) LIQD Take 237 mLs by mouth 3 (three) times daily between meals. 237 mL 12  . FLOVENT HFA 220 MCG/ACT inhaler INL 2 PFS PO BID  5  . folic acid (FOLVITE) 1 MG tablet Take 1 tablet (1 mg total) by  mouth daily. 30 tablet 0  . hydrochlorothiazide (HYDRODIURIL) 12.5 MG tablet Take 1 tablet by mouth daily.  2  . lidocaine-prilocaine (EMLA) cream Apply to affected area once 30 g 3  . magnesium oxide (MAG-OX) 400 (241.3 Mg) MG tablet Take 1 tablet (400 mg total) by mouth 3 (three) times daily. 90 tablet 3  . metoCLOPramide (REGLAN) 5 MG tablet Take 1 tablet (5 mg total) by mouth 4 (four) times daily -  before meals and at bedtime. 100 tablet 0  . metoprolol tartrate (LOPRESSOR) 25 MG tablet TAKE 1 TABLET BY MOUTH TWICE DAILY 180 tablet 3  . Multiple Vitamin (MULTIVITAMIN WITH MINERALS) TABS tablet Take 1 tablet by mouth daily. 30 tablet 1  . ondansetron (ZOFRAN-ODT) 8 MG disintegrating tablet Take 1 tablet (8 mg total) by mouth every 8 (eight) hours as needed for nausea or vomiting. 30 tablet 1  . pantoprazole (PROTONIX) 40 MG tablet TK 1 T PO QD  5  . potassium phosphate, monobasic, (K-PHOS ORIGINAL) 500 MG tablet Take 1 tablet (500 mg total) by mouth 4 (four) times daily -  with meals and at bedtime. 10 tablet 0  . PROAIR HFA 108 (90 Base) MCG/ACT inhaler INL 2 PFS PO Q 4 TO 6 H PRN  5  . promethazine (PHENERGAN) 25 MG suppository Place 1 suppository (25 mg total) rectally every 6 (six) hours as needed for nausea or vomiting. 12 each 5  . senna-docusate (SENOKOT-S) 8.6-50 MG tablet Take 1 tablet by mouth at bedtime as needed for mild constipation. 30 tablet 0  . STIOLTO RESPIMAT 2.5-2.5 MCG/ACT AERS INL 2 PFS PO QD  5  . sucralfate (CARAFATE) 1 g tablet Take 1 tablet (1 g total) by mouth 3 (three) times daily. 90 tablet 1  . thiamine 100 MG tablet Take 1 tablet (100 mg total) by mouth daily. 30 tablet 1  . tiZANidine (ZANAFLEX) 4 MG tablet Take 1 tablet (4 mg total) by mouth 2 (two) times daily as needed for muscle spasms. 60 tablet 1  . vitamin C (VITAMIN C) 500 MG tablet Take 1 tablet (500 mg total) by mouth 2 (two) times daily. 60 tablet 1  . Vitamin D, Ergocalciferol, (DRISDOL) 50000 units  CAPS capsule TK ONE C PO Q WEEK  5  . bisacodyl (DULCOLAX) 5 MG EC tablet Take 1 tablet (5 mg total) by mouth daily as needed for moderate constipation. (Patient not taking: Reported on  11/07/2017) 30 tablet 0  . OLANZapine zydis (ZYPREXA) 10 MG disintegrating tablet Take 1 tablet (10 mg total) by mouth at bedtime. (Patient not taking: Reported on 11/07/2017) 30 tablet 0  . pregabalin (LYRICA) 50 MG capsule 50 mg qhs x 1 week, then 50 mg BID week 2, then 50 mg TID thereafter (Patient not taking: Reported on 11/07/2017) 90 capsule 1   No current facility-administered medications for this visit.    Facility-Administered Medications Ordered in Other Visits  Medication Dose Route Frequency Provider Last Rate Last Dose  . heparin lock flush 100 unit/mL  500 Units Intravenous Once Corcoran, Melissa C, MD      . heparin lock flush 100 unit/mL  500 Units Intravenous Once Corcoran, Melissa C, MD      . sodium chloride flush (NS) 0.9 % injection 10 mL  10 mL Intravenous PRN Lequita Asal, MD   10 mL at 09/10/17 0847    Review of Systems  Constitutional: Positive for malaise/fatigue and weight loss (weight down 7 pounds). Negative for chills, diaphoresis and fever.       "Feeling a little better".  HENT: Negative.  Negative for congestion, ear discharge, ear pain, hearing loss, nosebleeds, sinus pain and sore throat.   Eyes: Negative.  Negative for blurred vision, double vision, photophobia, pain, discharge and redness.  Respiratory: Negative.  Negative for cough, hemoptysis, sputum production, shortness of breath, wheezing and stridor.   Cardiovascular: Positive for claudication (chronic; followed by cardiology for known PVD). Negative for chest pain, palpitations, orthopnea, leg swelling (bilateral ankle edema) and PND.  Gastrointestinal: Positive for nausea (little). Negative for abdominal pain, blood in stool, constipation, diarrhea, heartburn, melena and vomiting.       Starting to eat a little  better.  Genitourinary: Negative.  Negative for dysuria, frequency, hematuria and urgency.  Musculoskeletal: Positive for joint pain (hip and knees lock up). Negative for back pain (chronic), falls and myalgias.       BLE pain related to known PVD  Skin: Negative for itching and rash.  Neurological: Positive for weakness (generalized). Negative for dizziness, tingling, tremors, sensory change, speech change, focal weakness, seizures, loss of consciousness and headaches.  Psychiatric/Behavioral: Negative.  Negative for depression, memory loss, substance abuse and suicidal ideas. The patient is not nervous/anxious and does not have insomnia.   All other systems reviewed and are negative.  Performance status (ECOG): 2 - Symptomatic, <50% confined to bed  Physical Exam: Blood pressure 120/79, pulse 80, temperature (!) 96.5 F (35.8 C), temperature source Tympanic, resp. rate 16, weight 111 lb 5 oz (50.5 kg), SpO2 99 %. GENERAL:  Chronically fatigued appearing thin gentleman sitting comfortably in a wheelchair under a blanket in the exam room in no acute distress. MENTAL STATUS:  Alert and oriented to person, place and time. HEAD:  Shaved head.  Normocephalic, atraumatic, face symmetric, no Cushingoid features. EYES:  Brown eyes.  Pupils equal round and reactive to light and accomodation.  No conjunctivitis or scleral icterus. ENT:  Oropharynx clear without lesion.  Tongue normal. Mucous membranes moist.  RESPIRATORY:  Clear to auscultation without rales, wheezes or rhonchi. CARDIOVASCULAR:  Regular rate and rhythm without murmur, rub or gallop. ABDOMEN:  Scaphoid.  Soft, non-tender, with active bowel sounds, and no hepatosplenomegaly.  No masses. SKIN:  No rashes, ulcers or lesions. EXTREMITIES: No edema, no skin discoloration or tenderness.  No palpable cords. LYMPH NODES: No palpable cervical, supraclavicular, axillary or inguinal adenopathy  NEUROLOGICAL: Unremarkable. PSYCH:  Appropriate.  Infusion on 11/07/2017  Component Date Value Ref Range Status  . Magnesium 11/07/2017 1.8  1.7 - 2.4 mg/dL Final   Performed at [facility], [address], [place], [place] [address]  . Sodium 11/07/2017 127* 135 - 145 mmol/L Final  . Potassium 11/07/2017 3.7  3.5 - 5.1 mmol/L Final  . Chloride 11/07/2017 94* 98 - 111 mmol/L Final  . CO2 11/07/2017 23  22 - 32 mmol/L Final  . Glucose, Bld 11/07/2017 90  70 - 99 mg/dL Final  . BUN 11/07/2017 7  6 - 20 mg/dL Final  . Creatinine, Ser 11/07/2017 0.63  0.61 - 1.24 mg/dL Final  . Calcium 11/07/2017 8.6* 8.9 - 10.3 mg/dL Final  . Total Protein 11/07/2017 5.5* 6.5 - 8.1 g/dL Final  . Albumin 11/07/2017 3.1* 3.5 - 5.0 g/dL Final  . AST 11/07/2017 17  15 - 41 U/L Final  . ALT 11/07/2017 20  0 - 44 U/L Final  . Alkaline Phosphatase 11/07/2017 59  38 - 126 U/L Final  . Total Bilirubin 11/07/2017 1.1  0.3 - 1.2 mg/dL Final  . GFR calc non Af Amer 11/07/2017 >60  >60 mL/min Final  . GFR calc Af Amer 11/07/2017 >60  >60 mL/min Final   Comment: (NOTE) The eGFR has been calculated using the CKD EPI equation. This calculation has not been validated in all clinical situations. eGFR's persistently <60 mL/min signify possible Chronic Kidney Disease.   Georgiann Hahn gap 11/07/2017 10  5 - 15 Final   Performed at [facility], [address] [place], [place] [address]  . WBC 11/07/2017 4.9  3.8 - 10.6 K/uL Final  . RBC 11/07/2017 3.43* 4.40 - 5.90 MIL/uL Final  . Hemoglobin 11/07/2017 12.6* 13.0 - 18.0 g/dL Final  . HCT 11/07/2017 34.9* 40.0 - 52.0 % Final  . MCV 11/07/2017 101.6* 80.0 - 100.0 fL Final  . MCH 11/07/2017 36.7* 26.0 - 34.0 pg Final  . MCHC 11/07/2017 36.1* 32.0 - 36.0 g/dL Final  . RDW 11/07/2017 14.0  11.5 - 14.5 % Final  . Platelets 11/07/2017 155  150 - 440 K/uL Final  . Neutrophils Relative % 11/07/2017 75  % Final  . Neutro Abs 11/07/2017 3.7  1.4 - 6.5 K/uL Final  . Lymphocytes Relative 11/07/2017 18  % Final  .  Lymphs Abs 11/07/2017 0.9* 1.0 - 3.6 K/uL Final  . Monocytes Relative 11/07/2017 7  % Final  . Monocytes Absolute 11/07/2017 0.3  0.2 - 1.0 K/uL Final  . Eosinophils Relative 11/07/2017 0  % Final  . Eosinophils Absolute 11/07/2017 0.0  0 - 0.7 K/uL Final  . Basophils Relative 11/07/2017 0  % Final  . Basophils Absolute 11/07/2017 0.0  0 - 0.1 K/uL Final   Performed at [facility], [address], [place], [place] [address]    Assessment:  BREANDAN PEOPLE is a 57 y.o. male with extensive stage small cell lung cancer.  He presented with a 2 year history of night sweats and a 36 pound weight loss in 6 months.  Right supraclavicular biopsy on 07/19/2016 revealed small cell carcinoma of the lung.  CEA was 1.7 on 07/31/2016.  Chest CT on 06/28/2016 revealed extensive adenopathy.  There were multiple lymph nodes descending thoracic aorta, largest measuring 2.1 x 1.7 cm.  There was adenopathy in the aortopulmonary window with the largest confluence of lymph nodes measuring 4 x 2.6 cm. There was hilar adenopathy on the left measuring 2 x 1.9 cm.  There were multiple enlarged lymph nodes in the pretracheal region, largest 1.7 x 1.7 cm.  There were lymph nodes to the left of the carina measuring 2.8 x 1.9 cm. There was a subcarinal lymph node measuring 2.4 x 1.6 cm. There was a 4 x 3 mm nodular opacity in the anterior segment of the left upper lobe.    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  There was nodal metastasis within the chest and supraclavicular/low jugular regions.  There was multifocal osseous metastasis.  There was possibly a pathologic fracture involving the posteromedial right tenth rib.  There was new left lower lobe opacity with mild hypermetabolism, suspicious for infection or postobstructive pneumonitis.  He completed a course of Levaquin for apost-obstructive pneumonia.  He experienced transient positional left facial  burning.  Head MRI on 07/26/2016 revealed no evidence of brain metastasis. There was punctate DWI hyperintensity along a high right frontal gyrus, possible recent infarct.  There was a small occipital bone metastasis.  Head MRA on 07/27/2016 revealed normal large and medium sized vessels.  Bilateral carotid duplex on 07/27/2016 revealed <50% stenosis in the right and left internal carotis arteries.  Echo on 07/27/2016 revealed an EF of 45-50% without cardiac source of emboli.  He received 4 cycles of carboplatin and etoposide (07/31/2016 - 10/02/2016) with OnPro Neulasta support.  Platelet nadir was 40,000 on day 16 of cycle #2 and 23,000 on day 15 of cycle #3.  He received 2 units of PRBC with cycle #3 and 1 unit with cycle #4.  He received Xgeva on 08/13/2016 (last 10/30/2016).  Chest CT on 09/11/2016 revealed a marked response to therapy.  There was near complete resolution of left suprahilar mass and thoracic adenopathy.  There was interval sclerosis indicative of healing site of osseous metastasis.  Thoracic spine MRI on 10/22/2016 reveals sclerotic enhancing lesion at T7 vertebral body on the left compatible with metastasis. There was a healing fracture the right medial 10th rib.  PET scan on 11/12/2016 revealed a new 10 mm hypermetabolic left axillary lymph node (SUV 3.6) due to a left hand abscess.  There were no other sites of metabolically active disease.  He completed a course of thoracic spine radiation (3000 cGy in 10 fractions) on 01/11/2017.  He received PCI (3000 cGy in 15 fractions) from 01/28/2017 - 02/15/2017.  Chest, abdomen, and pelvic CT on 03/18/2017 revealed new areas of irregular ground-glass in the medial aspects of the right upper and right lower lobes as well as somewhat amorphous ground-glass in the lingula and left lower lobe. Time interval favored infectious or inflammatory lesions.  Osseous metastatic disease was stable.  There was resolved or resected left axillary lymph  node.  Bone scan on 04/10/2017 revealed multiple foci of skeletal metastasis within the ribs, pelvis, and RIGHT clavicle.  Multiple areas of activity corresponding to sclerotic lesions on CT and non-hypermetabolic lesions on PET scan.  PET scan on 04/26/2017 revealed bandlike densities medially in the right upper lobe and medially in the right lower lobe were new from 11/12/2016 but were present on 03/18/2017, and had faint hypermetabolic activity. Radiation pneumonitis could have this appearance.  The morphology would be unusual for malignancy but surveillance was warranted.  There were scattered stable sclerotic bony lesions that were not hypermetabolic.  There was persistent accentuated activity in the vicinity of the anus may be physiologic but was technically nonspecific.  LEFT hip radiograph on 09/02/2017 revealed degenerative changes in the bilateral hip  joints, with the left being more significant than the right. There was no mention of new osseous lytic lesions.  Bone scan on 10/02/2017 revealed multiple sites (sternum, manubrium, LEFT scapula, BILATERAL anterior and posterior ribs, RIGHT L3 vertebral body, superior LEFT SI joint, and anterior LEFT iliac bone) of abnormal osseous tracer accumulation consistent with osseous metastatic disease, with 2 sites of subtle new uptake in the anterior RIGHT approximately fourth rib.  He fell on his right side prior to imaging.  Head MRI on 10/16/2017 revealed a 6 x 7 mm metastatic lesion in the superior right frontal cortex adjacent to the falx cerebri.  There was severe white matter disease.  PET scan on 10/18/2017 revealed a new small nodular density within the anteromedial left upper lobe (SUV 2.4) which is suspicious for metastatic disease.  There are scattered sclerotic bone lesions without corresponding FDG uptake to suggest metabolically active bone disease.  He received CNS radiation from 10/23/2017 - 11/04/2017.  He is on a Decadron  taper.  He has chemotherapy induced anemia.  Anemia work-up on 09/11/2016 revealed the following normal labs:  Ferritin (219), iron sat (28%), TIBC (227), B12 (3055), and folate (6.8).  He has folate deficiency.  Folate was 5.9 on 08/27/2017 and 17 on 10/18/2017.  B12 was 542 on 08/27/2017.  He has a history of left lower extremity DVT a few months after his cardiac surgery in 08/2014.  He has peripheral vascular disease s/p stent placement in 03/2015.  He is on a Plavix.  He has not had a colonoscopy in 30 years.  He was ineligible for the AbbVie clinical trial ([id number]) secondary to his left hand abscess.  He underwent surgical debridement of a left hand abscess on 11/15/2016.  Wound cultures grew MRSA.  He was treated with emperic IV Vancomycin then Bactrim.   He was admitted to [facility] from 08/26/2017 - 08/30/2017 with nausea, vomiting, dehydration and electrolyte abnormalities.  Head MRI without contrast on 08/29/2017 revealed no evidence of metastatic disease. Prior UGI revealed no evidence of obstruction or gastroparesis.  Labs revealed no evidence of pancreatitis.  He was started on scheduled antiemetics and PPI BID.  AM cortisol on 08/28/2017 was low.  He was diagnosed with adrenal insufficiency.  He was started on prednisone 40 mg a day.  He was able to advance his diet.  Tube feeds were avoided.  Symptomatically, he is feeling a little better after completion of CNS radiation.  He has lost weight since his last visit, but is beginning to eat more.  He is unable to afford Lyrica prescribed by pain medicine.  Plan: 1. Labs today: CBC with diff, CMP, Mg. 2. Discuss pain management consult. Started on Lyrica, however cannot afford medication. He stopped using marijuana.  Will refer to [employer] for assistance.  3. Review PET scan- new small lesion in lung.  No evidence of active bone disease.   Images personally reviewed.  I agree with the radiologist interpretation.  At diagnosis, PET  scan from 08/6977 revealed metabolically active bone disease.  Significance of lung lesion unclear.  Discuss 6 week follow-up CT scan.  If scan shows progression, discuss need to re-initiate system chemotherapy. 4. Discuss low sodium. Sodium level 127 today. Patient being followed by nephrology Candiss Norse, MD). He has an appointment on 11/13/2017 at 1130. In the interim, will start NaCl 1,000 mg tablets BID. Rx sent to patient's pharmacy. 5. Discuss interval CNS radiation.  Continue Decadron taper as prescribed. 6. Schedule follow up chest  CT on 11/28/2017. 7. RTC in 2 weeks for labs (BMP) 8. RTC on 11/29/2017 for MD assessment, labs (CBC with diff, CMP, Mg), and review of CT imaging.    Honor Loh, NP  11/07/17, 9:06 AM   I saw and evaluated the patient, participating in the key portions of the service and reviewing pertinent diagnostic studies and records.  I reviewed the nurse practitioner's note and agree with the findings and the plan.  The assessment and plan were discussed with the patient.  Additional diagnostic study of chest CT is needed to clarify disease status and would change the clinical management.  Multiple questions were asked by the patient and answered.   Nolon Stalls, MD 11/07/17, 9:06 AM

## 2017-11-07 NOTE — Progress Notes (Signed)
Nutrition  RD was planning on seeing patient during infusion today.  Patient had already left infusion area when RD arrived so was unable to meet with patient today. Called patient and left message to return call.    Noted weight has dropped to 111 lb today from 117 lb 8 oz on 6/20.    Labs: Noted Vit C, thiamine, folate WNL, Mag 1.8.    Intervention: Would continue supplementation of folate, thiamin and vit C and MVI at this time with weight dropping.     Next visit: August 19 after MD visit  Wayland Baik B. Zenia Resides, [place], [place] Registered Dietitian [phone] (pager)

## 2017-11-21 ENCOUNTER — Inpatient Hospital Stay: Payer: Medicaid Other | Attending: Hematology and Oncology

## 2017-11-21 ENCOUNTER — Telehealth: Payer: Self-pay | Admitting: *Deleted

## 2017-11-21 DIAGNOSIS — E876 Hypokalemia: Secondary | ICD-10-CM | POA: Insufficient documentation

## 2017-11-21 DIAGNOSIS — R634 Abnormal weight loss: Secondary | ICD-10-CM | POA: Insufficient documentation

## 2017-11-21 DIAGNOSIS — E871 Hypo-osmolality and hyponatremia: Secondary | ICD-10-CM | POA: Insufficient documentation

## 2017-11-21 DIAGNOSIS — R112 Nausea with vomiting, unspecified: Secondary | ICD-10-CM | POA: Insufficient documentation

## 2017-11-21 DIAGNOSIS — C3490 Malignant neoplasm of unspecified part of unspecified bronchus or lung: Secondary | ICD-10-CM

## 2017-11-21 DIAGNOSIS — I739 Peripheral vascular disease, unspecified: Secondary | ICD-10-CM | POA: Insufficient documentation

## 2017-11-21 DIAGNOSIS — C7931 Secondary malignant neoplasm of brain: Secondary | ICD-10-CM | POA: Insufficient documentation

## 2017-11-21 DIAGNOSIS — F17210 Nicotine dependence, cigarettes, uncomplicated: Secondary | ICD-10-CM | POA: Insufficient documentation

## 2017-11-21 DIAGNOSIS — J0191 Acute recurrent sinusitis, unspecified: Secondary | ICD-10-CM | POA: Insufficient documentation

## 2017-11-21 DIAGNOSIS — C7951 Secondary malignant neoplasm of bone: Secondary | ICD-10-CM | POA: Insufficient documentation

## 2017-11-21 DIAGNOSIS — Z7902 Long term (current) use of antithrombotics/antiplatelets: Secondary | ICD-10-CM | POA: Insufficient documentation

## 2017-11-21 DIAGNOSIS — Z79899 Other long term (current) drug therapy: Secondary | ICD-10-CM | POA: Insufficient documentation

## 2017-11-21 DIAGNOSIS — C3412 Malignant neoplasm of upper lobe, left bronchus or lung: Secondary | ICD-10-CM | POA: Insufficient documentation

## 2017-11-21 LAB — BASIC METABOLIC PANEL
Anion gap: 8 (ref 5–15)
BUN: 10 mg/dL (ref 6–20)
CO2: 23 mmol/L (ref 22–32)
Calcium: 8.5 mg/dL — ABNORMAL LOW (ref 8.9–10.3)
Chloride: 103 mmol/L (ref 98–111)
Creatinine, Ser: 0.82 mg/dL (ref 0.61–1.24)
GFR calc Af Amer: >60 mL/min (ref >60)
GFR calc non Af Amer: >60 mL/min (ref >60)
Glucose, Bld: 88 mg/dL (ref 70–99)
Potassium: 3.6 mmol/L (ref 3.5–5.1)
Sodium: 134 mmol/L — ABNORMAL LOW (ref 135–145)

## 2017-11-21 NOTE — Telephone Encounter (Signed)
Patient called and reports that he is not feeling well and would like to get IV fluids tomorrow.  Discussed with NP's agree to get labs see him and give IV fluids as needed.  Patient accepts appointment for 915 tomorrow

## 2017-11-22 ENCOUNTER — Inpatient Hospital Stay (HOSPITAL_BASED_OUTPATIENT_CLINIC_OR_DEPARTMENT_OTHER): Payer: Medicaid Other | Admitting: Oncology

## 2017-11-22 ENCOUNTER — Inpatient Hospital Stay: Payer: Medicaid Other

## 2017-11-22 ENCOUNTER — Other Ambulatory Visit: Payer: Self-pay | Admitting: *Deleted

## 2017-11-22 VITALS — BP 109/86 | HR 84 | Temp 97.0°F | Resp 18

## 2017-11-22 DIAGNOSIS — E876 Hypokalemia: Secondary | ICD-10-CM | POA: Diagnosis not present

## 2017-11-22 DIAGNOSIS — J0191 Acute recurrent sinusitis, unspecified: Secondary | ICD-10-CM

## 2017-11-22 DIAGNOSIS — R197 Diarrhea, unspecified: Secondary | ICD-10-CM

## 2017-11-22 DIAGNOSIS — R112 Nausea with vomiting, unspecified: Secondary | ICD-10-CM

## 2017-11-22 DIAGNOSIS — E860 Dehydration: Secondary | ICD-10-CM

## 2017-11-22 DIAGNOSIS — R110 Nausea: Secondary | ICD-10-CM

## 2017-11-22 DIAGNOSIS — E871 Hypo-osmolality and hyponatremia: Secondary | ICD-10-CM

## 2017-11-22 DIAGNOSIS — C3490 Malignant neoplasm of unspecified part of unspecified bronchus or lung: Secondary | ICD-10-CM

## 2017-11-22 DIAGNOSIS — Z95828 Presence of other vascular implants and grafts: Secondary | ICD-10-CM

## 2017-11-22 LAB — CBC WITH DIFFERENTIAL/PLATELET
BASOS PCT: 1 %
Basophils Absolute: 0.1 10*3/uL (ref 0–0.1)
Eosinophils Absolute: 0 10*3/uL (ref 0–0.7)
Eosinophils Relative: 1 %
HEMATOCRIT: 33.8 % — AB (ref 40.0–52.0)
Hemoglobin: 11.9 g/dL — ABNORMAL LOW (ref 13.0–18.0)
LYMPHS ABS: 0.7 10*3/uL — AB (ref 1.0–3.6)
Lymphocytes Relative: 14 %
MCH: 35.7 pg — AB (ref 26.0–34.0)
MCHC: 35.2 g/dL (ref 32.0–36.0)
MCV: 101.4 fL — AB (ref 80.0–100.0)
MONO ABS: 0.4 10*3/uL (ref 0.2–1.0)
MONOS PCT: 9 %
NEUTROS ABS: 3.7 10*3/uL (ref 1.4–6.5)
Neutrophils Relative %: 75 %
Platelets: 150 10*3/uL (ref 150–440)
RBC: 3.33 MIL/uL — ABNORMAL LOW (ref 4.40–5.90)
RDW: 14.2 % (ref 11.5–14.5)
WBC: 4.8 10*3/uL (ref 3.8–10.6)

## 2017-11-22 LAB — COMPREHENSIVE METABOLIC PANEL
ALBUMIN: 2.6 g/dL — AB (ref 3.5–5.0)
ALK PHOS: 72 U/L (ref 38–126)
ALT: 11 U/L (ref 0–44)
ANION GAP: 7 (ref 5–15)
AST: 15 U/L (ref 15–41)
BUN: 11 mg/dL (ref 6–20)
CALCIUM: 8.3 mg/dL — AB (ref 8.9–10.3)
CO2: 23 mmol/L (ref 22–32)
Chloride: 103 mmol/L (ref 98–111)
Creatinine, Ser: 0.8 mg/dL (ref 0.61–1.24)
GFR calc Af Amer: >60 mL/min (ref >60)
GFR calc non Af Amer: >60 mL/min (ref >60)
GLUCOSE: 96 mg/dL (ref 70–99)
Potassium: 3.3 mmol/L — ABNORMAL LOW (ref 3.5–5.1)
SODIUM: 133 mmol/L — AB (ref 135–145)
Total Bilirubin: 1.1 mg/dL (ref 0.3–1.2)
Total Protein: 5.1 g/dL — ABNORMAL LOW (ref 6.5–8.1)

## 2017-11-22 LAB — MAGNESIUM: Magnesium: 1.4 mg/dL — ABNORMAL LOW (ref 1.7–2.4)

## 2017-11-22 MED ORDER — SODIUM CHLORIDE 0.9% FLUSH
10.0000 mL | INTRAVENOUS | Status: AC | PRN
  Filled 2017-11-22: qty 10

## 2017-11-22 MED ORDER — HEPARIN SOD (PORK) LOCK FLUSH 100 UNIT/ML IV SOLN
500.0000 [IU] | Freq: Once | INTRAVENOUS | Status: AC
  Administered 2017-11-22: 500 [IU] via INTRAVENOUS

## 2017-11-22 MED ORDER — AMOXICILLIN-POT CLAVULANATE 875-125 MG PO TABS: 1.0000 | ORAL_TABLET | Freq: Two times a day (BID) | ORAL | 0 refills | Status: DC

## 2017-11-22 MED ORDER — DEXAMETHASONE SODIUM PHOSPHATE 10 MG/ML IJ SOLN
10.0000 mg | Freq: Once | INTRAMUSCULAR | Status: AC
  Administered 2017-11-22: 10 mg via INTRAVENOUS
  Filled 2017-11-22: qty 1

## 2017-11-22 MED ORDER — MAGNESIUM SULFATE 50 % IJ SOLN
INTRAMUSCULAR | Status: AC
  Administered 2017-11-22: 10:00:00 via INTRAVENOUS
  Filled 2017-11-22 (×2): qty 1000

## 2017-11-22 MED ORDER — SODIUM CHLORIDE 0.9 % IV SOLN: Freq: Once | INTRAVENOUS | Status: DC

## 2017-11-22 MED ORDER — ONDANSETRON HCL 4 MG/2ML IJ SOLN
8.0000 mg | Freq: Once | INTRAMUSCULAR | Status: AC
  Administered 2017-11-22: 8 mg via INTRAVENOUS
  Filled 2017-11-22: qty 4

## 2017-11-22 MED ORDER — DEXAMETHASONE 4 MG PO TABS: 4.0000 mg | ORAL_TABLET | Freq: Every day | ORAL | 0 refills | Status: DC

## 2017-11-22 NOTE — Progress Notes (Signed)
Yes  Symptom Management Consult note [facility]  [phone] [phone]  Patient Care Team: Lorelee Market, MD as PCP - General (Family Medicine) Wellington Hampshire, MD as PCP - Cardiology (Cardiology)   Name of the patient: Philip [name]  [id number]  18-Mar-1961   Date of visit: 11/22/17  Diagnosis- Small Cell Lung Cancer  Chief complaint/ Reason for visit- nausea/vomiting/weakness  Heme/Onc history: Patient was last seen by primary medical oncologist Dr. Mike Gip on 11/07/2017 to discuss PET scan.  PET scan revealed new small lesion in lung.  He was found to have hyponatremia with a sodium of 128 and a magnesium of 1.6.  He was started on NaCl 1000 mg tablets twice daily.  He was scheduled to return to clinic in 2 weeks for labs. Oncology History   Philip [name] is a 57 y.o. male with extensive stage small cell lung cancer.  He presented with a 2 year history of night sweats and a 36 pound weight loss in 6 months.  Right supraclavicular biopsy on 07/19/2016 revealed small cell carcinoma of the lung.  CEAwas 1.7 on 07/31/2016.  Chest CT on 06/28/2016 revealed extensive adenopathy.  There were multiple lymph nodes descending thoracic aorta, largest measuring 2.1 x 1.7 cm.  There was adenopathy in the aortopulmonary window with the largest confluence of lymph nodes measuring 4 x 2.6 cm. There was hilar adenopathy on the left measuring 2 x 1.9 cm. There were multiple enlarged lymph nodes in the pretracheal region, largest 1.7 x 1.7 cm.  There were lymph nodes to the left of the carina measuring 2.8 x 1.9 cm. There was a subcarinal lymph node measuring 2.4 x 1.6 cm. There was a 4 x 3 mm nodular opacity in the anterior segment of the left upper lobe.    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  There was nodal metastasis within the chest and supraclavicular/low jugular regions.   There was multifocal osseous metastasis.  There was possibly a pathologic fracture involving the posteromedial right tenth rib.  There was new left lower lobe opacity with mild hypermetabolism, suspicious for infection or postobstructive pneumonitis.  He completed a course of Levaquin for apost-obstructive pneumonia.  He experienced transient positional left facial burning.  Head MRI on 07/26/2016 revealed no evidence of brain metastasis. There was punctate DWI hyperintensity along a high right frontal gyrus, possible recent infarct.  There was a small occipital bone metastasis.  Head MRA on 07/27/2016 revealed normal large and medium sized vessels.  Bilateral carotid duplex on 07/27/2016 revealed <50% stenosis in the right and left internal carotis arteries.  Echo on 07/27/2016 revealed an EF of 45-50% without cardiac source of emboli.  He received 4 cycles of carboplatin and etoposide (07/31/2016 - 10/02/2016) with OnPro Neulasta support.  Platelet nadir was 40,000 on day 16 of cycle #2 and 23,000 on day 15 of cycle #3.  He received 2 units of PRBC with cycle #3 and 1 unit with cycle #4.  He received Xgeva on 08/13/2016 (last 10/30/2016).  Chest CT on 09/11/2016 revealed a marked response to therapy.  There was near complete resolution of left suprahilar mass and thoracic adenopathy.  There was interval sclerosis indicative of healing site of osseous metastasis.  Thoracic spine MRI on 10/22/2016 reveals sclerotic enhancing lesion at T7 vertebral body on the left compatible with metastasis. There was a healing fracture the right medial 10th rib.  PET scan on 11/12/2016 revealed a  new 10 mm hypermetabolic left axillary lymph node (SUV 3.6) due to a left hand abscess.  There were no other sites of metabolically active disease.  He completed a course of thoracic spine radiation (3000 cGy in 10 fractions) on 01/11/2017.  He received PCI (3000 cGy in 15 fractions) from 01/28/2017 -  02/15/2017.  Chest, abdomen, and pelvic CT on 03/18/2017 revealed new areas of irregular ground-glass in the medial aspects of the right upper and right lower lobes as well as somewhat amorphous ground-glass in the lingula and left lower lobe. Time interval favored infectious or inflammatory lesions.  Osseous metastatic disease was stable.  There was resolved or resected left axillary lymph node.  Bone scan on 04/10/2017 revealed multiple foci of skeletal metastasis within the ribs, pelvis, and RIGHT clavicle.  Multiple areas of activity corresponding to sclerotic lesions on CT and non-hypermetabolic lesions on PET scan.  PET scan on 04/26/2017 revealed bandlike densities medially in the right upper lobe and medially in the right lower lobe were new from 11/12/2016 but were present on 03/18/2017, and had faint hypermetabolic activity. Radiation pneumonitis could have this appearance.  The morphology would be unusual for malignancy but surveillance was warranted.  There were scattered stable sclerotic bony lesions that were not hypermetabolic.  There was persistent accentuated activity in the vicinity of the anus may be physiologic but was technically nonspecific.  LEFT hip radiograph on 09/02/2017 revealed degenerative changes in the bilateral hip joints, with the left being more significant than the right. There was no mention of new osseous lytic lesions.  Bone scan on 10/02/2017 revealed multiple sites (sternum, manubrium, LEFT scapula, BILATERAL anterior and posterior ribs, RIGHT L3 vertebral body, superior LEFT SI joint, and anterior LEFT iliac bone) of abnormal osseous tracer accumulation consistent with osseous metastatic disease, with 2 sites of subtle new uptake in the anterior RIGHT approximately fourth rib.  He fell on his right side prior to imaging.  He has chemotherapy induced anemia.  Anemia work-up on 09/11/2016 revealed the following normal labs:  Ferritin (219), iron sat (28%),  TIBC (227), B12 (3055), and folate (6.8).  He has folate deficiency.  Folate was 5.9 on 08/27/2017.  B12 was 542 on 08/27/2017.  He has a history of left lower extremity DVT a few months after his cardiac surgery in 08/2014.  He has peripheral vascular disease s/p stent placement in 03/2015.  He is on a Plavix.  He has not had a colonoscopy in 30 years.  He was ineligible for the AbbVie clinical trial (M16-298) secondary to his left hand abscess.  He underwent surgical debridement of a left hand abscess on 11/15/2016.  Wound cultures grew MRSA.  He was treated with emperic IV Vancomycin then Bactrim.   He was admitted to [facility] from 08/26/2017 - 08/30/2017 with nausea, vomiting, dehydration and electrolyte abnormalities.  Head MRI without contrast on 08/29/2017 revealed no evidence of metastatic disease. Prior UGI revealed no evidence of obstruction or gastroparesis.  Labs revealed no evidence of pancreatitis.  He was started on scheduled antiemetics and PPI BID.  AM cortisol on 08/28/2017 was low.  He was diagnosed with adrenal insufficiency.  He was started on prednisone 40 mg a day.  He was able to advance his diet.  Tube feeds were avoided.      Cancer of upper lobe of left lung ([place])   07/31/2016 Initial Diagnosis    Cancer of upper lobe of left lung (HCC)     Interval history-  Patient  presents with weakness, nausea and diarrhea Nausea and weakness began approximately 1 week ago. Describes the severity as severe.  Has associated symptoms of nausea without vomiting and intermittent abdominal pain.  Has diarrhea that is watery. 2-4 episodes daily.  Has recently completed course of Decadron.  Completed whole brain radiation on 11/04/2017.  Has not had chemo (Etoposide) since June 2018.  Has continued significant weight loss.  Patient complains of abdominal pain. The pain is described as cramping and dull, and is 5/10 in intensity. Pain is located in the LUQ, LLQ without radiation. Onset was 7  days ago. Symptoms have been stable since. Aggravating factors: none.  Alleviating factors: NSAIDs. Associated symptoms: anorexia and diarrhea. The patient denies fever and melena.   Patient complains of facial pain, nasal congestion, post nasal drip and productive cough with  yellow and green colored sputum. Symptoms include facial pain, nasal congestion, nausea without vomiting, no  fever and productive cough with  yellow and green colored sputum with no fever, chills, night sweats or weight loss. Onset of symptoms was 2 weeks ago, gradually worsening since that time. He has had very poor oral intake.  Past history is significant for chronic bronchitis. Patient is former smoker.    ECOG FS:1 - Symptomatic but completely ambulatory  Review of systems- Review of Systems  Constitutional: Positive for malaise/fatigue. Negative for chills, fever and weight loss.  HENT: Positive for sinus pain. Negative for congestion and ear pain.   Eyes: Negative.  Negative for blurred vision and double vision.  Respiratory: Positive for cough (Chronic) and sputum production. Negative for shortness of breath.   Cardiovascular: Negative.  Negative for chest pain, palpitations and leg swelling.  Gastrointestinal: Positive for abdominal pain, diarrhea and nausea. Negative for constipation and vomiting.  Genitourinary: Negative for dysuria, frequency and urgency.  Musculoskeletal: Positive for joint pain. Negative for back pain and falls.  Skin: Negative.  Negative for rash.  Neurological: Negative.  Negative for weakness and headaches.  Endo/Heme/Allergies: Negative.  Does not bruise/bleed easily.  Psychiatric/Behavioral: Negative.  Negative for depression. The patient is not nervous/anxious and does not have insomnia.      Current treatment- s/p whole brain radiation which was completed on 11/04/2017.  Allergies  Allergen Reactions  . Tylenol [Acetaminophen] Palpitations  . Amitriptyline Other (See Comments)     Burning sensation all over  . Aspirin   . Contrast Media [Iodinated Diagnostic Agents] Other (See Comments)    Pt states that he got "knots behind his ears"  . Nsaids Other (See Comments)    Blood in stools  . Ibuprofen Other (See Comments) and Nausea Only    Blood in stools  . Tape Rash and Other (See Comments)    PLEASE USE COBAN WRAP; THE PATIENT'S SKIN TEARS WHEN BANDAGES ARE REMOVED!!     Past Medical History:  Diagnosis Date  . Anemia   . CAD (coronary artery disease)    a. 08/2014 Inf STEMI/CABG x 3 (LIMA->LAD, VG->Diag, RIMA->RCA).  . DVT, recurrent, lower extremity, acute, left ([place]) 2016  . GIB (gastrointestinal bleeding)    a. In the setting of DAPT-->tolerating plavix only.  . Hyperlipidemia   . Hypertensive heart disease   . Lung cancer ([place])    a. s/p chemo/radiation.  Marland Kitchen PAD (peripheral artery disease) ([place])    a. 03/2015 Periph Angio: short occlusion of L pop w/ evidence of embolization into the DP->5.0x50 mm Inova self-expanding stent;  b. 04/08/2015 ABI: R 1.12, L 0.99; c. 01/2017  LE Duplex: bilat SFA obstructive dzs, patent L pop.  . Tobacco abuse   . Toe amputation status, left [facility]) 07/09/2016   2017     Past Surgical History:  Procedure Laterality Date  . CARDIAC CATHETERIZATION N/A 08/24/2014   Procedure: Left Heart Cath and Coronary Angiography;  Surgeon: Isaias Cowman, MD;  Location: [place] CV LAB;  Service: Cardiovascular;  Laterality: N/A;  . CARDIAC CATHETERIZATION N/A 08/24/2014   Procedure: Coronary Stent Intervention;  Surgeon: Isaias Cowman, MD;  Location: [place] CV LAB;  Service: Cardiovascular;  Laterality: N/A;  . CORONARY ARTERY BYPASS GRAFT N/A 08/25/2014   Procedure: CORONARY ARTERY BYPASS GRAFTING (CABG), ON PUMP, TIMES THREE, USING BILATERAL MAMMARY ARTERIES, RIGHT GREATER SAPHENOUS VEIN HARVESTED ENDOSCOPICALLY;  Surgeon: Melrose Nakayama, MD;  Location: [place];  Service: Open Heart Surgery;  Laterality: N/A;   . ESOPHAGOGASTRODUODENOSCOPY (EGD) WITH PROPOFOL N/A 08/08/2017   Procedure: ESOPHAGOGASTRODUODENOSCOPY (EGD) WITH PROPOFOL;  Surgeon: Jonathon Bellows, MD;  Location: [facility] ENDOSCOPY;  Service: Gastroenterology;  Laterality: N/A;  . INCISION AND DRAINAGE ABSCESS Left 11/15/2016   Procedure: INCISION AND DRAINAGE ABSCESS OF LEFT WRIST;  Surgeon: Roseanne Kaufman, MD;  Location: [place];  Service: Orthopedics;  Laterality: Left;  . PEG PLACEMENT N/A 10/01/2017   Procedure: PERCUTANEOUS ENDOSCOPIC GASTROSTOMY (PEG) PLACEMENT;  Surgeon: Lucilla Lame, MD;  Location: [facility] ENDOSCOPY;  Service: Endoscopy;  Laterality: N/A;  . PERIPHERAL VASCULAR CATHETERIZATION N/A 03/30/2015   Procedure: Abdominal Aortogram w/Lower Extremity;  Surgeon: Wellington Hampshire, MD;  Location: [place] CV LAB;  Service: Cardiovascular;  Laterality: N/A;  . PORTA CATH INSERTION N/A 07/27/2016   Procedure: Glori Luis Cath Insertion;  Surgeon: Algernon Huxley, MD;  Location: [place] CV LAB;  Service: Cardiovascular;  Laterality: N/A;  . TEE WITHOUT CARDIOVERSION N/A 08/25/2014   Procedure: TRANSESOPHAGEAL ECHOCARDIOGRAM (TEE);  Surgeon: Melrose Nakayama, MD;  Location: [place];  Service: Open Heart Surgery;  Laterality: N/A;    Social History   Socioeconomic History  . Marital status: Married    Spouse name: Not on file  . Number of children: Not on file  . Years of education: Not on file  . Highest education level: Not on file  Occupational History  . Not on file  Social Needs  . Financial resource strain: Not on file  . Food insecurity:    Worry: Not on file    Inability: Not on file  . Transportation needs:    Medical: Not on file    Non-medical: Not on file  Tobacco Use  . Smoking status: Current Every Day Smoker    Packs/day: 0.25    Types: Cigarettes  . Smokeless tobacco: Never Used  . Tobacco comment: 5-6 cigs in a week.   Substance and Sexual Activity  . Alcohol use: Yes    Alcohol/week: 0.0 standard drinks     Comment: Sometimes on w/e.  . Drug use: No  . Sexual activity: Not on file  Lifestyle  . Physical activity:    Days per week: Not on file    Minutes per session: Not on file  . Stress: Not on file  Relationships  . Social connections:    Talks on phone: Not on file    Gets together: Not on file    Attends religious service: Not on file    Active member of club or organization: Not on file    Attends meetings of clubs or organizations: Not on file    Relationship status: Not on  file  . Intimate partner violence:    Fear of current or ex partner: Not on file    Emotionally abused: Not on file    Physically abused: Not on file    Forced sexual activity: Not on file  Other Topics Concern  . Not on file  Social History Narrative  . Not on file    Family History  Problem Relation Age of Onset  . Hypertension Father   . Lung cancer Father   . Heart attack Mother 56       STENT  . Hyperlipidemia Mother   . Heart attack Brother   . Heart attack Brother   . Breast cancer Sister   . Heart attack Sister   . Colon cancer Paternal Grandfather      Current Outpatient Medications:  .  atorvastatin (LIPITOR) 40 MG tablet, TAKE 1 TABLET BY MOUTH DAILY, Disp: 90 tablet, Rfl: 3 .  azithromycin (ZITHROMAX) 250 MG tablet, 2 tabs (500 mg) x 1 day, then 1 tab (250 mg) x 4 days., Disp: 6 tablet, Rfl: 0 .  Calcium Carb-Cholecalciferol (CALCIUM + D3 PO), Take 1 tablet by mouth 3 (three) times daily., Disp: , Rfl:  .  clopidogrel (PLAVIX) 75 MG tablet, Take 1 tablet (75 mg total) by mouth daily., Disp: 90 tablet, Rfl: 3 .  dexamethasone (DECADRON) 4 MG tablet, Take 1 tablet (4 mg total) by mouth daily. Take 1/2 tablet x 7 days, then 1/2 tablet every other day until finished., Disp: 10 tablet, Rfl: 0 .  diphenhydrAMINE (BENADRYL) 25 mg capsule, Take 25 mg by mouth 2 (two) times daily as needed for allergies. , Disp: , Rfl:  .  famotidine (PEPCID) 20 MG tablet, Take 1 tablet (20 mg total) by mouth  2 (two) times daily., Disp: 60 tablet, Rfl: 0 .  feeding supplement, ENSURE ENLIVE, (ENSURE ENLIVE) LIQD, Take 237 mLs by mouth 3 (three) times daily between meals., Disp: 237 mL, Rfl: 12 .  FLOVENT HFA 220 MCG/ACT inhaler, INL 2 PFS PO BID, Disp: , Rfl: 5 .  folic acid (FOLVITE) 1 MG tablet, Take 1 tablet (1 mg total) by mouth daily., Disp: 30 tablet, Rfl: 0 .  hydrochlorothiazide (HYDRODIURIL) 12.5 MG tablet, Take 1 tablet by mouth daily., Disp: , Rfl: 2 .  lidocaine-prilocaine (EMLA) cream, Apply to affected area once, Disp: 30 g, Rfl: 3 .  magnesium oxide (MAG-OX) 400 (241.3 Mg) MG tablet, Take 1 tablet (400 mg total) by mouth 3 (three) times daily., Disp: 90 tablet, Rfl: 3 .  metoCLOPramide (REGLAN) 5 MG tablet, Take 1 tablet (5 mg total) by mouth 4 (four) times daily -  before meals and at bedtime., Disp: 100 tablet, Rfl: 0 .  metoprolol tartrate (LOPRESSOR) 25 MG tablet, TAKE 1 TABLET BY MOUTH TWICE DAILY, Disp: 180 tablet, Rfl: 3 .  Multiple Vitamin (MULTIVITAMIN WITH MINERALS) TABS tablet, Take 1 tablet by mouth daily., Disp: 30 tablet, Rfl: 1 .  ondansetron (ZOFRAN-ODT) 8 MG disintegrating tablet, Take 1 tablet (8 mg total) by mouth every 8 (eight) hours as needed for nausea or vomiting., Disp: 30 tablet, Rfl: 1 .  pantoprazole (PROTONIX) 40 MG tablet, TK 1 T PO QD, Disp: , Rfl: 5 .  potassium phosphate, monobasic, (K-PHOS ORIGINAL) 500 MG tablet, Take 1 tablet (500 mg total) by mouth 4 (four) times daily -  with meals and at bedtime., Disp: 10 tablet, Rfl: 0 .  PROAIR HFA 108 (90 Base) MCG/ACT inhaler, INL 2 PFS PO Q  4 TO 6 H PRN, Disp: , Rfl: 5 .  promethazine (PHENERGAN) 25 MG suppository, Place 1 suppository (25 mg total) rectally every 6 (six) hours as needed for nausea or vomiting., Disp: 12 each, Rfl: 5 .  senna-docusate (SENOKOT-S) 8.6-50 MG tablet, Take 1 tablet by mouth at bedtime as needed for mild constipation., Disp: 30 tablet, Rfl: 0 .  sodium chloride 1 g tablet, Take 1  tablet (1 g total) by mouth 2 (two) times daily with a meal., Disp: 60 tablet, Rfl: 0 .  STIOLTO RESPIMAT 2.5-2.5 MCG/ACT AERS, INL 2 PFS PO QD, Disp: , Rfl: 5 .  sucralfate (CARAFATE) 1 g tablet, Take 1 tablet (1 g total) by mouth 3 (three) times daily., Disp: 90 tablet, Rfl: 1 .  thiamine 100 MG tablet, Take 1 tablet (100 mg total) by mouth daily., Disp: 30 tablet, Rfl: 1 .  tiZANidine (ZANAFLEX) 4 MG tablet, Take 1 tablet (4 mg total) by mouth 2 (two) times daily as needed for muscle spasms., Disp: 60 tablet, Rfl: 1 .  vitamin C (VITAMIN C) 500 MG tablet, Take 1 tablet (500 mg total) by mouth 2 (two) times daily., Disp: 60 tablet, Rfl: 1 .  Vitamin D, Ergocalciferol, (DRISDOL) 50000 units CAPS capsule, TK ONE C PO Q WEEK, Disp: , Rfl: 5 .  amoxicillin-clavulanate (AUGMENTIN) 875-125 MG tablet, Take 1 tablet by mouth 2 (two) times daily., Disp: 14 tablet, Rfl: 0 .  bisacodyl (DULCOLAX) 5 MG EC tablet, Take 1 tablet (5 mg total) by mouth daily as needed for moderate constipation. (Patient not taking: Reported on 11/07/2017), Disp: 30 tablet, Rfl: 0 .  OLANZapine zydis (ZYPREXA) 10 MG disintegrating tablet, Take 1 tablet (10 mg total) by mouth at bedtime. (Patient not taking: Reported on 11/07/2017), Disp: 30 tablet, Rfl: 0 .  pregabalin (LYRICA) 50 MG capsule, 50 mg qhs x 1 week, then 50 mg BID week 2, then 50 mg TID thereafter (Patient not taking: Reported on 11/22/2017), Disp: 90 capsule, Rfl: 1 No current facility-administered medications for this visit.   Facility-Administered Medications Ordered in Other Visits:  .  heparin lock flush 100 unit/mL, 500 Units, Intravenous, Once, Corcoran, Melissa C, MD .  heparin lock flush 100 unit/mL, 500 Units, Intravenous, Once, Corcoran, Melissa C, MD .  heparin lock flush 100 unit/mL, 500 Units, Intravenous, Once, Burns, Jennifer E, NP .  sodium chloride 0.9 % 1,000 mL with potassium chloride 10 mEq, magnesium sulfate 4 g infusion, , Intravenous, Continuous,  Burns, Jennifer E, NP, Last Rate: 500 mL/hr at 11/22/17 0945 .  sodium chloride flush (NS) 0.9 % injection 10 mL, 10 mL, Intravenous, PRN, Mike Gip, Melissa C, MD, 10 mL at 09/10/17 0847 .  sodium chloride flush (NS) 0.9 % injection 10 mL, 10 mL, Intravenous, PRN, Jacquelin Hawking, NP  Physical exam:  Vitals:   11/22/17 0917  BP: 109/86  Pulse: 84  Resp: 18  Temp: (!) 97 F (36.1 C)  TempSrc: Tympanic  SpO2: 100%   Physical Exam  Constitutional: He is oriented to person, place, and time. Vital signs are normal. He appears cachectic. He appears ill.  HENT:  Head: Normocephalic and atraumatic.  Nose: Right sinus exhibits maxillary sinus tenderness and frontal sinus tenderness. Left sinus exhibits maxillary sinus tenderness and frontal sinus tenderness.  Mouth/Throat: Oropharynx is clear and moist. Mucous membranes are dry.  Eyes: Pupils are equal, round, and reactive to light.  Neck: Normal range of motion.  Cardiovascular: Normal rate, regular rhythm and normal  heart sounds.  No murmur heard. Pulmonary/Chest: Effort normal. He has decreased breath sounds. He has no wheezes.  Abdominal: Soft. Normal appearance and bowel sounds are normal. He exhibits no distension. There is no tenderness.  C/o LLQ and LUQ abdominal pain.  Unable to reproduce.  Unable to collect stool sample in clinic.  Normal bowel sounds.  Musculoskeletal: Normal range of motion. He exhibits no edema.  Neurological: He is alert and oriented to person, place, and time.  Skin: Skin is warm and dry. No rash noted.  Psychiatric: Judgment normal.     CMP Latest Ref Rng & Units 11/22/2017  Glucose 70 - 99 mg/dL 96  BUN 6 - 20 mg/dL 11  Creatinine 0.61 - 1.24 mg/dL 0.80  Sodium 135 - 145 mmol/L 133(L)  Potassium 3.5 - 5.1 mmol/L 3.3(L)  Chloride 98 - 111 mmol/L 103  CO2 22 - 32 mmol/L 23  Calcium 8.9 - 10.3 mg/dL 8.3(L)  Total Protein 6.5 - 8.1 g/dL 5.1(L)  Total Bilirubin 0.3 - 1.2 mg/dL 1.1  Alkaline Phos 38 -  126 U/L 72  AST 15 - 41 U/L 15  ALT 0 - 44 U/L 11   CBC Latest Ref Rng & Units 11/22/2017  WBC 3.8 - 10.6 K/uL 4.8  Hemoglobin 13.0 - 18.0 g/dL 11.9(L)  Hematocrit 40.0 - 52.0 % 33.8(L)  Platelets 150 - 440 K/uL 150    No images are attached to the encounter.  No results found.   Assessment and plan- Patient is a 57 y.o. male who presents for weakness, nausea, diarrhea and intermittent left upper and lower quadrant pain.  1.  Small cell lung cancer: Recently completed whole brain radiation on 11/04/2017.  Recently tapered off Decadron prescribed by Dr. Donella Stade.  Last dose was earlier in the week.  Scheduled to return to clinic for results of CT scan that is scheduled for 11/28/2017 and then to see Dr. Mike Gip on 12/02/2017 for further assessment.  2.  Grade 1 Diarrhea/abdominal pain: Nonreproducible during my assessment.  States its intermittent.  Not related to food intake.  Admits to 2 loose bowel movements daily.  Attempted to collect a stool sample unsuccessful in clinic.  Stool sample kit sent home with patient.  Told to return to clinic ASAP.  If diarrhea continues or worsens, patient instructed to return to clinic for further evaluation with potential imaging.  3.  Sinusitis: Patient previously treated with amoxicillin for 5 days several weeks.  States he continues to cough up yellow/green mucus.  Continues to have sinus congestion and pain. RX Augmentin x7 days.  4.  Nausea/weakness: Potentially could be due to adrenal insufficiency.  Spoke with Dr. Mike Gip and she recommends restarting a taper of steroids. RX Decadron taper 4 mg tablets.  Take half a tablet daily for 7 days and then every other day until gone.  He additionally received 10 mg Decadron and 8 mg Zofran in clinic today.  5.  Electrolyte imbalances: Chronic hyponatremia.  Sodium improving and is 133 today.  Continue salt tablets as prescribed.  Magnesium 1.4 today.  Potassium 3.3.  Will give 4 g magnesium, 1000 mL's normal  saline 10 mEq potassium.  Encourage patient to return next week for labs only but he declined at this time.  Wife states she will call if he has any further needs.   Visit Diagnosis 1. Diarrhea, unspecified type   2. Hypomagnesemia   3. Hyponatremia   4. Hypokalemia   5. Nausea without vomiting  Patient expressed understanding and was in agreement with this plan. He also understands that He can call clinic at any time with any questions, concerns, or complaints.   Greater than 50% was spent in counseling and coordination of care with this patient including but not limited to discussion of the relevant topics above (See A&P) including, but not limited to diagnosis and management of acute and chronic medical conditions.    Faythe Casa, AGNP-C [facility]- [phone] Pager- [id number] 11/22/2017 12:10 PM

## 2017-11-26 ENCOUNTER — Telehealth: Payer: Self-pay | Admitting: *Deleted

## 2017-11-26 NOTE — Telephone Encounter (Signed)
I returned call  To patient to offer an appointment and he states he had flds the other day and that he is fine today and declined an appointment

## 2017-11-26 NOTE — Telephone Encounter (Signed)
-----   Message from Donnita [place], RN sent at 11/26/2017 10:34 AM EDT ----- Patient left message on phone that he thinks he needs IV fluids.  Please call him back  Thanks,

## 2017-11-27 ENCOUNTER — Other Ambulatory Visit: Payer: Self-pay

## 2017-11-27 ENCOUNTER — Ambulatory Visit
Payer: Medicaid Other | Attending: Student in an Organized Health Care Education/Training Program | Admitting: Student in an Organized Health Care Education/Training Program

## 2017-11-27 ENCOUNTER — Encounter: Payer: Self-pay | Admitting: Student in an Organized Health Care Education/Training Program

## 2017-11-27 ENCOUNTER — Telehealth: Payer: Self-pay | Admitting: *Deleted

## 2017-11-27 VITALS — BP 111/80 | HR 77 | Temp 97.7°F | Resp 16 | Ht 71.0 in | Wt 112.0 lb

## 2017-11-27 DIAGNOSIS — F17210 Nicotine dependence, cigarettes, uncomplicated: Secondary | ICD-10-CM | POA: Insufficient documentation

## 2017-11-27 DIAGNOSIS — I2510 Atherosclerotic heart disease of native coronary artery without angina pectoris: Secondary | ICD-10-CM | POA: Insufficient documentation

## 2017-11-27 DIAGNOSIS — R112 Nausea with vomiting, unspecified: Secondary | ICD-10-CM | POA: Diagnosis not present

## 2017-11-27 DIAGNOSIS — Z888 Allergy status to other drugs, medicaments and biological substances status: Secondary | ICD-10-CM | POA: Insufficient documentation

## 2017-11-27 DIAGNOSIS — Z923 Personal history of irradiation: Secondary | ICD-10-CM | POA: Insufficient documentation

## 2017-11-27 DIAGNOSIS — G893 Neoplasm related pain (acute) (chronic): Secondary | ICD-10-CM | POA: Insufficient documentation

## 2017-11-27 DIAGNOSIS — E785 Hyperlipidemia, unspecified: Secondary | ICD-10-CM | POA: Insufficient documentation

## 2017-11-27 DIAGNOSIS — Z9221 Personal history of antineoplastic chemotherapy: Secondary | ICD-10-CM | POA: Insufficient documentation

## 2017-11-27 DIAGNOSIS — Z5181 Encounter for therapeutic drug level monitoring: Secondary | ICD-10-CM | POA: Insufficient documentation

## 2017-11-27 DIAGNOSIS — Z85118 Personal history of other malignant neoplasm of bronchus and lung: Secondary | ICD-10-CM | POA: Diagnosis not present

## 2017-11-27 DIAGNOSIS — K219 Gastro-esophageal reflux disease without esophagitis: Secondary | ICD-10-CM | POA: Diagnosis not present

## 2017-11-27 DIAGNOSIS — M792 Neuralgia and neuritis, unspecified: Secondary | ICD-10-CM

## 2017-11-27 DIAGNOSIS — G8929 Other chronic pain: Secondary | ICD-10-CM

## 2017-11-27 DIAGNOSIS — Z951 Presence of aortocoronary bypass graft: Secondary | ICD-10-CM | POA: Insufficient documentation

## 2017-11-27 DIAGNOSIS — G629 Polyneuropathy, unspecified: Secondary | ICD-10-CM | POA: Diagnosis not present

## 2017-11-27 DIAGNOSIS — M546 Pain in thoracic spine: Secondary | ICD-10-CM

## 2017-11-27 DIAGNOSIS — G894 Chronic pain syndrome: Secondary | ICD-10-CM | POA: Diagnosis not present

## 2017-11-27 DIAGNOSIS — R197 Diarrhea, unspecified: Secondary | ICD-10-CM

## 2017-11-27 DIAGNOSIS — I252 Old myocardial infarction: Secondary | ICD-10-CM | POA: Diagnosis not present

## 2017-11-27 DIAGNOSIS — Z886 Allergy status to analgesic agent status: Secondary | ICD-10-CM | POA: Diagnosis not present

## 2017-11-27 DIAGNOSIS — Z8673 Personal history of transient ischemic attack (TIA), and cerebral infarction without residual deficits: Secondary | ICD-10-CM | POA: Insufficient documentation

## 2017-11-27 DIAGNOSIS — M79672 Pain in left foot: Secondary | ICD-10-CM | POA: Diagnosis not present

## 2017-11-27 DIAGNOSIS — M79671 Pain in right foot: Secondary | ICD-10-CM | POA: Insufficient documentation

## 2017-11-27 LAB — C DIFFICILE QUICK SCREEN W PCR REFLEX
C DIFFICILE (CDIFF) INTERP: NOT DETECTED
C Diff antigen: NEGATIVE
C Diff toxin: NEGATIVE

## 2017-11-27 MED ORDER — METHOCARBAMOL 500 MG PO TABS: 500.0000 mg | ORAL_TABLET | Freq: Three times a day (TID) | ORAL | 1 refills | Status: DC | PRN

## 2017-11-27 NOTE — Progress Notes (Signed)
Patient's Name: Philip [name]  MRN: [id number]  Referring Provider: Lorelee Market, MD  DOB: 07-19-1960  PCP: Lorelee Market, MD  DOS: 11/27/2017  Note by: Gillis Santa, MD  Service setting: Ambulatory outpatient  Specialty: Interventional Pain Management  Location: [facility] (AMB) Pain Management Facility    Patient type: Established   Primary Reason(s) for Visit: Encounter for prescription drug management. (Level of risk: moderate)  CC: No chief complaint on file.  HPI  Mr. Galentine is a 57 y.o. year old, male patient, who comes today for a medication management evaluation. He has Bilateral foot pain; Tobacco use disorder; Convulsions ([place]); Preventative health care; Right ankle pain; History of MI (myocardial infarction); CAD (coronary artery disease), native coronary artery; S/P CABG x 3; Acute myocardial infarction of inferior wall ([place]); Acute sinusitis, unspecified; Hypertension; Pain in joint involving ankle and foot; Pain in soft tissues of limb; Status post aorto-coronary artery bypass graft; Pure hypercholesterolemia; Rash and other nonspecific skin eruption; PAD (peripheral artery disease) ([place]); Hyperlipidemia, unspecified; Peripheral neuropathy; Iron deficiency anemia; Lymphadenopathy, mediastinal; Weight loss; Night sweats; Small cell lung cancer ([place]); Bone metastasis ([place]); Postobstructive pneumonia; Goals of care, counseling/discussion; Acute CVA (cerebrovascular accident) ([place]); Cancer of upper lobe of left lung ([place]); Cancer related pain; Cough; Routine history and physical examination of adult; Hyponatremia; Hypocalcemia; Hypomagnesemia; Charcot's joint of foot (Right); Chronic foot pain (Left); Chronic pain syndrome; Dry gangrene ([place]); GERD (gastroesophageal reflux disease); Myocardial infarction ([place]); Neuropathic pain; Osteopenia of both feet; Encounter for antineoplastic chemotherapy; Chronic thoracic spine pain; Antineoplastic chemotherapy induced anemia; Abscess of left hand;  Non-intractable vomiting with nausea; Hypokalemia; Dehydration; Skin abscess; 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 His primarily concern today is the No chief complaint on file.  Pain Assessment: Location: Lower Back Radiating: Hips bilateral, down to knee, and feet Onset: More than a month ago Duration: Chronic pain Quality: Constant Severity: 8 /10 (subjective, self-reported pain score)  Note: Reported level is compatible with observation.                         When using our objective Pain Scale, levels between 6 and 10/10 are said to belong in an emergency room, as it progressively worsens from a 6/10, described as severely limiting, requiring emergency care not usually available at an outpatient pain management facility. At a 6/10 level, communication becomes difficult and requires great effort. Assistance to reach the emergency department may be required. Facial flushing and profuse sweating along with potentially dangerous increases in heart rate and blood pressure will be evident. Effect on ADL:   Timing: Constant Modifying factors: Denies BP: 111/80  HR: 77  Mr. Lemelin was last scheduled for an appointment on 11/05/2017 for medication management. During today's appointment we reviewed Mr. Haydon chronic pain status, as well as his outpatient medication regimen.  Patient follows up today for medication management.  He states that it took him over 2 weeks to get his Lyrica.  He is currently taking Lyrica 50 mg during the day.  I instructed the patient to increase his Lyrica to 50 mg twice daily starting tomorrow and then 50 3 times daily starting next week.  He does not endorse any side effects with his current Lyrica dose.   Patient states that he has discontinued  his marijuana intake.  We will repeat urine drug screen today.  So long as this is negative or his numerical value is lower, we can consider taking the patient over for chronic opioid therapy.  The patient  reports that he does not use drugs. His body mass index is 15.62 kg/m.  Further details on both, my assessment(s), as well as the proposed treatment plan, please see below.  Controlled Substance Pharmacotherapy Assessment REMS (Risk Evaluation and Mitigation Strategy)   Monitoring: [place] PMP: Online review of the past 60-monthperiod conducted. Compliant with practice rules and regulations Last UDS on record: Summary  Date Value Ref Range Status  10/31/2017 FINAL  Final    Comment:    ==================================================================== TOXASSURE COMP DRUG ANALYSIS,UR ==================================================================== Test                             Result       Flag       Units Drug Present and Declared for Prescription Verification   Diphenhydramine                PRESENT      EXPECTED Drug Present not Declared for Prescription Verification   Carboxy-THC                    133          UNEXPECTED ng/mg creat    Carboxy-THC is a metabolite of tetrahydrocannabinol  (THC).    [facility] most commonly illicit, but THC is also present    in a scheduled prescription medication.   Noroxycodone                   98           UNEXPECTED ng/mg creat    Noroxycodone is an expected metabolite of oxycodone. Sources of    oxycodone include scheduled prescription medications. Drug Absent but Declared for Prescription Verification   Pregabalin                     Not Detected UNEXPECTED   Tizanidine                     Not Detected UNEXPECTED    Tizanidine, as indicated in the declared medication list, is not    always detected even when used as directed.   Olanzapine                     Not Detected UNEXPECTED    Promethazine                   Not Detected UNEXPECTED   Lidocaine                      Not Detected UNEXPECTED    Lidocaine, as indicated in the declared medication list, is not    always detected even when used as directed.   Metoprolol                     Not Detected UNEXPECTED ==================================================================== Test                      Result    Flag   Units      Ref Range   Creatinine              124  mg/dL      >=20 ==================================================================== Declared Medications:  The flagging and interpretation on this report are based on the  following declared medications.  Unexpected results may arise from  inaccuracies in the declared medications.  **Note: The testing scope of this panel includes these medications:  Diphenhydramine (Benadryl)  Metoprolol  Olanzapine (Zyprexa)  Pregabalin  Promethazine  **Note: The testing scope of this panel does not include small to  moderate amounts of these reported medications:  Lidocaine  Tizanidine  **Note: The testing scope of this panel does not include following  reported medications:  Albuterol (ProAir HFA)  Atorvastatin (Lipitor)  Calcium carbonate (Calcium carbonate/Vitamin D3)  Clopidogrel (Plavix)  Dexamethasone (Decadron)  Docusate  Docusate (Dulcolax)  Famotidine (Pepcid)  Fluticasone (Flovent)  Folic acid  Hydrochlorothiazide  Magnesium Oxide  Metoclopramide  Multivitamin  Ondansetron (Zofran)  Pantoprazole (Protonix)  Potassium  Prednisone  Prilocaine  Sennosides  Sucralfate  Supplement  Tiotropium  Vitamin B1 (Thiamine)  Vitamin C  Vitamin D2 (Drisdol)  Vitamin D3 (Calcium carbonate/Vitamin D3) ==================================================================== For clinical consultation, please call [phone]. ====================================================================    UDS interpretation: Non-Compliant  Undeclared illicit substance detected repeat UDS today.  Patient states that he has discontinued his marijuana intake Medication Assessment Form: N/A Treatment compliance: N/A Risk Assessment Profile: Aberrant behavior: See prior evaluations. None observed or detected today Comorbid factors increasing risk of overdose: See prior notes. No additional risks detected today Opioid risk tool (ORT) (Total Score): 5 Personal History of Substance Abuse (SUD-Substance use disorder):  Alcohol: Negative  Illegal Drugs: Positive Male or Male  Rx Drugs: Negative  ORT Risk Level calculation: Moderate Risk Risk of substance use disorder (SUD): Moderate Opioid Risk Tool - 11/27/17 1241      Family History of Substance Abuse   Alcohol  Positive Male    Illegal Drugs  Negative    Rx Drugs  Negative      Personal History of Substance Abuse   Alcohol  Negative    Illegal Drugs  Positive Male or Male    Rx Drugs  Negative      Age   Age between 82-45 years   No      History of Preadolescent Sexual Abuse   History of Preadolescent Sexual Abuse  Negative or Male      Psychological Disease   Psychological Disease  Negative    Depression  Negative      Total Score   Opioid Risk Tool Scoring  5    Opioid Risk Interpretation  Moderate Risk      ORT Scoring interpretation table:  Score <3 = Low Risk for SUD  Score between 4-7 = Moderate Risk for SUD  Score >8 = High Risk for Opioid Abuse   Risk Mitigation Strategies:  Patient Counseling: N/A Patient-Prescriber Agreement (PPA): N/A  Notification to other healthcare providers: We will repeat UDS today.  So long as THC is negative or lower levels than prior UDS, can consider taking patient over for COT  Pharmacologic Plan: Repeat UDS             Laboratory Chemistry  Inflammation Markers (CRP: Acute Phase) (ESR: Chronic Phase) Lab Results  Component Value Date   CRP 1.3 (H) 11/15/2016   ESRSEDRATE 92 (H) 11/15/2016   LATICACIDVEN  0.6 08/26/2017                         Rheumatology Markers Lab Results  Component Value Date  LABURIC 4.2 (L) 06/28/2016                        Renal Function Markers Lab Results  Component Value Date   BUN 11 11/22/2017   CREATININE 0.80 11/22/2017   BCR 5 (L) 06/09/2015   GFRAA >60 11/22/2017   GFRNONAA >60 11/22/2017                             Hepatic Function Markers Lab Results  Component Value Date   AST 15 11/22/2017   ALT 11 11/22/2017   ALBUMIN 2.6 (L) 11/22/2017   ALKPHOS 72 11/22/2017   LIPASE 37 08/26/2017                        Electrolytes Lab Results  Component Value Date   NA 133 (L) 11/22/2017   K 3.3 (L) 11/22/2017   CL 103 11/22/2017   CALCIUM 8.3 (L) 11/22/2017   MG 1.4 (L) 11/22/2017   PHOS 4.6 10/03/2017                        Neuropathy Markers Lab Results  Component Value Date   VITAMINB12 542 08/27/2017   FOLATE 17.0 10/18/2017   HGBA1C 5.1 07/27/2016   HIV Non Reactive 08/27/2017                        Bone Pathology Markers No results found for: VD25OH, [id number], CH8527PO2, [id number], 25OHVITD1, 25OHVITD2, 25OHVITD3, TESTOFREE, TESTOSTERONE                       Coagulation Parameters Lab Results  Component Value Date   INR 0.89 08/28/2017   LABPROT 12.0 08/28/2017   APTT 34 07/26/2016   PLT 150 11/22/2017   DDIMER 0.29 07/02/2014                        Cardiovascular Markers Lab Results  Component Value Date   CKTOTAL 48 06/09/2015   TROPONINI <0.03 08/26/2017   HGB 11.9 (L) 11/22/2017   HCT 33.8 (L) 11/22/2017                         CA Markers Lab Results  Component Value Date   CEA 1.7 07/31/2016                        Note: Lab results reviewed.  Recent Diagnostic Imaging Results  NM PET Image Restag (PS) Skull Base To Thigh CLINICAL DATA:  Subsequent treatment strategy for metastatic small cell lung cancer.  EXAM: NUCLEAR MEDICINE PET SKULL BASE TO THIGH  TECHNIQUE: 6.6 mCi F-18 FDG was  injected intravenously. Full-ring PET imaging was performed from the skull base to thigh after the radiotracer. CT data was obtained and used for attenuation correction and anatomic localization.  Fasting blood glucose: 70 mg/dl  COMPARISON:  04/26/2017  FINDINGS: Mediastinal blood pool activity: SUV max 1.8  NECK: No hypermetabolic lymph nodes in the neck.  Incidental CT findings: none  CHEST: No hypermetabolic axillary or supraclavicular lymph nodes. No hypermetabolic mediastinal or hilar lymph nodes. Within the anteromedial left upper lobe there is an 8 mm lung nodule, image 82/3. SUV max is equal to 2.4. New from previous exam. No additional  pulmonary nodules identified.  Incidental CT findings: Previous median sternotomy and CABG procedure. Aortic atherosclerosis with calcifications in the native coronary arteries.  ABDOMEN/PELVIS: No abnormal FDG uptake identified within the liver, pancreas, or spleen. The adrenal glands are unremarkable. No hypermetabolic lymph nodes identified within the abdomen or pelvis. No hypermetabolic inguinal lymph nodes.  Incidental CT findings: Calcified gallstones. Aortic atherosclerosis with branch vessel disease noted.  SKELETON: No abnormal areas of increased uptake to suggest metabolically active bone metastases. Left rib sclerotic metastases are identified without corresponding increased FDG uptake. Previously noted asymmetric area of sclerosis within the left iliac bone is less apparent on today's study.  Incidental CT findings: none  IMPRESSION: 1. There is a new small nodular density within the anteromedial left upper lobe which exhibits mild to moderate increased uptake. Suspicious for metastatic disease. 2. No additional foci of abnormal FDG uptake identified. 3. Scattered sclerotic bone lesions without corresponding FDG uptake to suggest metabolically active bone metastases. 4.  Aortic Atherosclerosis (ICD10-I70.0). 5.  Gallstones.  Electronically Signed   By: Kerby Moors M.D.   On: 10/18/2017 11:44  Complexity Note: Imaging results reviewed. Results shared with Mr. Dauber, using Layman's terms.                         Meds   Current Outpatient Medications:  .  amoxicillin-clavulanate (AUGMENTIN) 875-125 MG tablet, Take 1 tablet by mouth 2 (two) times daily., Disp: 14 tablet, Rfl: 0 .  atorvastatin (LIPITOR) 40 MG tablet, TAKE 1 TABLET BY MOUTH DAILY, Disp: 90 tablet, Rfl: 3 .  azithromycin (ZITHROMAX) 250 MG tablet, 2 tabs (500 mg) x 1 day, then 1 tab (250 mg) x 4 days., Disp: 6 tablet, Rfl: 0 .  Calcium Carb-Cholecalciferol (CALCIUM + D3 PO), Take 1 tablet by mouth 3 (three) times daily., Disp: , Rfl:  .  clopidogrel (PLAVIX) 75 MG tablet, Take 1 tablet (75 mg total) by mouth daily., Disp: 90 tablet, Rfl: 3 .  dexamethasone (DECADRON) 4 MG tablet, Take 1 tablet (4 mg total) by mouth daily. Take 1/2 tablet x 7 days, then 1/2 tablet every other day until finished., Disp: 10 tablet, Rfl: 0 .  diphenhydrAMINE (BENADRYL) 25 mg capsule, Take 25 mg by mouth 2 (two) times daily as needed for allergies. , Disp: , Rfl:  .  famotidine (PEPCID) 20 MG tablet, Take 1 tablet (20 mg total) by mouth 2 (two) times daily., Disp: 60 tablet, Rfl: 0 .  feeding supplement, ENSURE ENLIVE, (ENSURE ENLIVE) LIQD, Take 237 mLs by mouth 3 (three) times daily between meals., Disp: 237 mL, Rfl: 12 .  FLOVENT HFA 220 MCG/ACT inhaler, INL 2 PFS PO BID, Disp: , Rfl: 5 .  folic acid (FOLVITE) 1 MG tablet, Take 1 tablet (1 mg total) by mouth daily., Disp: 30 tablet, Rfl: 0 .  hydrochlorothiazide (HYDRODIURIL) 12.5 MG tablet, Take 1 tablet by mouth daily., Disp: , Rfl: 2 .  lidocaine-prilocaine (EMLA) cream, Apply to affected area once, Disp: 30 g, Rfl: 3 .  magnesium oxide (MAG-OX) 400 (241.3 Mg) MG tablet, Take 1 tablet (400 mg total) by mouth 3 (three) times daily., Disp: 90 tablet, Rfl: 3 .  metoCLOPramide (REGLAN) 5 MG tablet, Take 1  tablet (5 mg total) by mouth 4 (four) times daily -  before meals and at bedtime., Disp: 100 tablet, Rfl: 0 .  metoprolol tartrate (LOPRESSOR) 25 MG tablet, TAKE 1 TABLET BY MOUTH TWICE DAILY, Disp: 180 tablet, Rfl: 3 .  Multiple Vitamin (MULTIVITAMIN WITH MINERALS) TABS tablet, Take 1 tablet by mouth daily., Disp: 30 tablet, Rfl: 1 .  ondansetron (ZOFRAN-ODT) 8 MG disintegrating tablet, Take 1 tablet (8 mg total) by mouth every 8 (eight) hours as needed for nausea or vomiting., Disp: 30 tablet, Rfl: 1 .  pantoprazole (PROTONIX) 40 MG tablet, TK 1 T PO QD, Disp: , Rfl: 5 .  potassium phosphate, monobasic, (K-PHOS ORIGINAL) 500 MG tablet, Take 1 tablet (500 mg total) by mouth 4 (four) times daily -  with meals and at bedtime., Disp: 10 tablet, Rfl: 0 .  pregabalin (LYRICA) 50 MG capsule, 50 mg qhs x 1 week, then 50 mg BID week 2, then 50 mg TID thereafter, Disp: 90 capsule, Rfl: 1 .  PROAIR HFA 108 (90 Base) MCG/ACT inhaler, INL 2 PFS PO Q 4 TO 6 H PRN, Disp: , Rfl: 5 .  promethazine (PHENERGAN) 25 MG suppository, Place 1 suppository (25 mg total) rectally every 6 (six) hours as needed for nausea or vomiting., Disp: 12 each, Rfl: 5 .  senna-docusate (SENOKOT-S) 8.6-50 MG tablet, Take 1 tablet by mouth at bedtime as needed for mild constipation., Disp: 30 tablet, Rfl: 0 .  sodium chloride 1 g tablet, Take 1 tablet (1 g total) by mouth 2 (two) times daily with a meal., Disp: 60 tablet, Rfl: 0 .  STIOLTO RESPIMAT 2.5-2.5 MCG/ACT AERS, INL 2 PFS PO QD, Disp: , Rfl: 5 .  sucralfate (CARAFATE) 1 g tablet, Take 1 tablet (1 g total) by mouth 3 (three) times daily., Disp: 90 tablet, Rfl: 1 .  thiamine 100 MG tablet, Take 1 tablet (100 mg total) by mouth daily., Disp: 30 tablet, Rfl: 1 .  vitamin C (VITAMIN C) 500 MG tablet, Take 1 tablet (500 mg total) by mouth 2 (two) times daily., Disp: 60 tablet, Rfl: 1 .  Vitamin D, Ergocalciferol, (DRISDOL) 50000 units CAPS capsule, TK ONE C PO Q WEEK, Disp: , Rfl: 5 .   methocarbamol (ROBAXIN) 500 MG tablet, Take 1 tablet (500 mg total) by mouth every 8 (eight) hours as needed for muscle spasms., Disp: 90 tablet, Rfl: 1 No current facility-administered medications for this visit.   Facility-Administered Medications Ordered in Other Visits:  .  heparin lock flush 100 unit/mL, 500 Units, Intravenous, Once, Corcoran, Melissa C, MD .  heparin lock flush 100 unit/mL, 500 Units, Intravenous, Once, Corcoran, Melissa C, MD .  sodium chloride 0.9 % 1,000 mL with potassium chloride 10 mEq, magnesium sulfate 4 g infusion, , Intravenous, Continuous, Burns, Wandra Feinstein, NP, Stopped at 11/22/17 1201 .  sodium chloride flush (NS) 0.9 % injection 10 mL, 10 mL, Intravenous, PRN, Mike Gip, Melissa C, MD, 10 mL at 09/10/17 0847 .  sodium chloride flush (NS) 0.9 % injection 10 mL, 10 mL, Intravenous, PRN, Burns, Wandra Feinstein, NP  ROS  Constitutional: Denies any fever or chills Gastrointestinal: No reported hemesis, hematochezia, vomiting, or acute GI distress Musculoskeletal: Denies any acute onset joint swelling, redness, loss of ROM, or weakness Neurological: No reported episodes of acute onset apraxia, aphasia, dysarthria, agnosia, amnesia, paralysis, loss of coordination, or loss of consciousness  Allergies  Mr. Alipio is allergic to tylenol [acetaminophen]; amitriptyline; aspirin; contrast media [iodinated diagnostic agents]; nsaids; ibuprofen; and tape.  PFSH  Drug: Mr. Woodfield  reports that he does not use drugs. Alcohol:  reports that he drinks alcohol. Tobacco:  reports that he has been smoking cigarettes. He has been smoking about 0.25 packs per day. He has never  used smokeless tobacco. 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 Surgical: Mr. Mcneel  has a past surgical history that includes Coronary artery bypass graft (N/A, 08/25/2014); TEE without cardioversion (N/A, 08/25/2014); Cardiac catheterization (N/A, 08/24/2014); Cardiac catheterization (N/A, 08/24/2014); Cardiac catheterization (N/A, 03/30/2015); PORTA CATH INSERTION (N/A, 07/27/2016); Incision and drainage abscess (Left, 11/15/2016); Esophagogastroduodenoscopy (egd) with propofol (N/A, 08/08/2017); and PEG placement (N/A, 10/01/2017). Family: family history includes Breast cancer in his sister; Colon cancer in his paternal grandfather; Heart attack in his brother, brother, and sister; Heart attack (age of onset: 67) in his mother; Hyperlipidemia in his mother; Hypertension in his father; Lung cancer in his father.  Constitutional Exam  General appearance: Well nourished, well developed, and well hydrated. In no apparent acute distress Vitals:   11/27/17 1230  BP: 111/80  Pulse: 77  Resp: 16  Temp: 97.7 F (36.5 C)  SpO2: 100%  Weight: 112 lb (50.8 kg)  Height: '5\' 11"'  (1.803 m)   BMI Assessment: Estimated body mass index is 15.62 kg/m as calculated from the following:   Height as of this encounter: '5\' 11"'  (1.803 m).   Weight as of this encounter: 112 lb (50.8 kg).  BMI interpretation table: BMI level Category Range association with higher incidence of chronic pain  <18 kg/m2 Underweight   18.5-24.9 kg/m2 Ideal body weight   25-29.9 kg/m2 Overweight Increased incidence by 20%  30-34.9 kg/m2 Obese (Class I) Increased incidence by 68%  35-39.9 kg/m2 Severe obesity (Class II) Increased incidence by 136%  >40 kg/m2 Extreme obesity (Class III) Increased incidence by 254%   Patient's current BMI Ideal Body weight  Body mass index is 15.62 kg/m. Ideal body weight: 75.3 kg (166 lb 0.1 oz)   BMI Readings from Last 4 Encounters:  11/27/17 15.62 kg/m  11/07/17 15.52 kg/m  10/31/17 15.90 kg/m  10/18/17 16.51 kg/m    Wt Readings from Last 4 Encounters:  11/27/17 112 lb (50.8 kg)  11/07/17 111 lb 5 oz (50.5 kg)  10/31/17 114 lb (51.7 kg)  10/18/17 118 lb 6.4 oz (53.7 kg)  Psych/Mental status: Alert, oriented x 3 (person, place, & time)       Eyes: PERLA Respiratory: No evidence of acute respiratory distress  Cervical Spine Area Exam  Skin & Axial Inspection: No masses, redness, edema, swelling, or associated skin lesions Alignment: Symmetrical Functional ROM: Unrestricted ROM      Stability: No instability detected Muscle Tone/Strength: Functionally intact. No obvious neuro-muscular anomalies detected. Sensory (Neurological): Unimpaired Palpation: No palpable anomalies                    Upper Extremity (UE) Exam    Side: Right upper extremity  Side: Left upper extremity   Skin & Extremity Inspection: Skin color, temperature, and hair growth are WNL. No peripheral edema or cyanosis. No masses, redness, swelling, asymmetry, or associated skin lesions. No contractures.  Skin & Extremity Inspection: Skin color, temperature, and hair growth are WNL. No peripheral edema or cyanosis. No masses, redness, swelling, asymmetry, or associated skin lesions. No contractures.   Functional ROM: Unrestricted ROM          Functional ROM: Unrestricted ROM           Muscle Tone/Strength: Functionally intact. No obvious neuro-muscular anomalies  detected.  Muscle Tone/Strength: Functionally intact. No obvious neuro-muscular anomalies detected.   Sensory (Neurological): Unimpaired          Sensory (Neurological): Unimpaired           Palpation: No palpable anomalies              Palpation: No palpable anomalies               Provocative Test(s):  Phalen's test: deferred Tinel's test: deferred Apley's scratch test (touch opposite shoulder):  Action 1 (Across chest): deferred Action 2 (Overhead): deferred Action 3 (LB reach): deferred   Provocative Test(s):  Phalen's test: deferred Tinel's test:  deferred Apley's scratch test (touch opposite shoulder):  Action 1 (Across chest): deferred Action 2 (Overhead): deferred Action 3 (LB reach): deferred     Thoracic Spine Area Exam  Skin & Axial Inspection: No masses, redness, or swelling Alignment: Symmetrical Functional ROM: Unrestricted ROM Stability: No instability detected Muscle Tone/Strength: Functionally intact. No obvious neuro-muscular anomalies detected. Sensory (Neurological): Musculoskeletal pain pattern Muscle strength & Tone: Complains of area being tender to palpation  Lumbar Spine Area Exam  Skin & Axial Inspection: No masses, redness, or swelling Alignment: Symmetrical Functional ROM: Decreased ROM       Stability: No instability detected Muscle Tone/Strength: Functionally intact. No obvious neuro-muscular anomalies detected. Sensory (Neurological): Musculoskeletal pain pattern Palpation: Complains of area being tender to palpation       Provocative Tests: Lumbar Hyperextension/rotation test: deferred today       Lumbar quadrant test (Kemp's test): deferred today       Lumbar Lateral bending test: deferred today       Patrick's Maneuver: deferred today                   FABER test: deferred today                   Thigh-thrust test: deferred today       S-I compression test: deferred today       S-I distraction test: deferred today        Gait & Posture Assessment  Ambulation: Patient came in today in a wheel chair Gait: Limited. Using assistive device to ambulate Posture: Difficulty standing up straight, due to pain   Lower Extremity Exam    Side: Right lower extremity  Side: Left lower extremity  Stability: No instability observed          Stability: No instability observed          Skin & Extremity Inspection: Skin color, temperature, and hair growth are WNL. No peripheral edema or cyanosis. No masses, redness, swelling, asymmetry, or associated skin lesions. No contractures.  Skin & Extremity  Inspection: Skin color, temperature, and hair growth are WNL. No peripheral edema or cyanosis. No masses, redness, swelling, asymmetry, or associated skin lesions. No contractures.  Functional ROM: Unrestricted ROM                  Functional ROM: Unrestricted ROM                  Muscle Tone/Strength: Functionally intact. No obvious neuro-muscular anomalies detected.  Muscle Tone/Strength: Functionally intact. No obvious neuro-muscular anomalies detected.  Sensory (Neurological): Unimpaired  Sensory (Neurological): Unimpaired  Palpation: No palpable anomalies  Palpation: No palpable anomalies    Assessment  Primary Diagnosis & Pertinent Problem List: The primary encounter diagnosis was Chronic pain syndrome. Diagnoses of Chronic thoracic spine pain, Neuropathic  pain, S/P CABG x 3, History of MI (myocardial infarction), and Bilateral foot pain were also pertinent to this visit.  Status Diagnosis  Persistent Persistent Persistent 1. Chronic pain syndrome   2. Chronic thoracic spine pain   3. Neuropathic pain   4. S/P CABG x 3   5. History of MI (myocardial infarction)   6. Bilateral foot pain      General Recommendations: The pain condition that the patient suffers from is best treated with a multidisciplinary approach that involves an increase in physical activity to prevent de-conditioning and worsening of the pain cycle, as well as psychological counseling (formal and/or informal) to address the co-morbid psychological affects of pain. Treatment will often involve judicious use of pain medications and interventional procedures to decrease the pain, allowing the patient to participate in the physical activity that will ultimately produce long-lasting pain reductions. The goal of the multidisciplinary approach is to return the patient to a higher level of overall function and to restore their ability to perform activities of daily living.  57 year old male with a history of chronic  pain secondary to lung cancer status post chemoradiation now with relapse.  Patient was obtaining chronic opioid therapy from his oncologist but had a urine drug screen that was positive for [facility] and was discharged from his oncology practice for pain management.  Patient does have a history of peripheral arterial disease as well as coronary artery disease and has left popliteal stent in place.  Patient does have a history of chronic tobacco abuse along with a family history of lung cancer.  Patient follows up today for consideration of chronic opioid therapy in the context of relapse of his lung cancer.Patient has previously tried gabapentin, amitriptyline, nortriptyline along with various NSAIDs.  Gabapentin resulted in side effects of sedation and vivid dreams.  He also had cognitive side effects with amitriptyline and NSAID made him vomit.  Patient has lost a significant amount of weight given his malignancy.  Patient does utilize marijuana for appetite stimulation and for nausea management.  His oxycodone dose is 5 mg 3 times daily to 4 times daily as needed.   To be considered for chronic opioid therapy, the patient will need to provide a urine drug toxicology screen that is negative for THC.  After that, patient can be considered for chronic opioid therapy.  We can consider current regimen of oxycodone 5 mg 3 times daily to 4 times daily or other alternatives such as the buprenorphine patch, hydrocodone, tapentadol.  Today we will repeat UDS.  This should be negative for THC or at least his quantitative levels should be much lower than his prior UDS screen.  So long as his UDS is appropriate, we can consider taking the patient over for chronic opioid therapy including oxycodone 5 mg 3 times daily to 4 times daily as needed.  I have instructed the patient to continue his Lyrica titration as prescribed by increasing to 50 mg twice daily starting tomorrow and then 50 mill grams 3 times daily.  Patient also  finds tizanidine not effective.  Patient instructed to discontinue tizanidine.  We can trial Robaxin as below.  I will see the patient in 1 to 2 weeks.   Plan: -Repeat UDS.  This should be negative for THC or at least his quantitative level should be much lower than prior UDS screen -Continue Lyrica as prescribed next -Stop tizanidine.  Prescription for Robaxin as below for muscle spasms  Future considerations:  Cymbalta, Effexor,  Oxycodone as previously prescribed, hydrocodone, buprenorphine patch, tramadol, tapentadol  Plan of Care  Pharmacotherapy (Medications Ordered): Meds ordered this encounter  Medications  . methocarbamol (ROBAXIN) 500 MG tablet    Sig: Take 1 tablet (500 mg total) by mouth every 8 (eight) hours as needed for muscle spasms.    Dispense:  90 tablet    Refill:  1    Do not place this medication, or any other prescription from our practice, on "Automatic Refill". Patient may have prescription filled one day early if pharmacy is closed on scheduled refill date.   Lab-work, procedure(s), and/or referral(s): Orders Placed This Encounter  Procedures  . Compliance Drug Analysis, Ur    Pharmacological management options:  Opioid Analgesics: The patient was informed that there is no guarantee that he would be a candidate for opioid analgesics. The decision will be made following CDC guidelines. This decision will be based on the results of diagnostic studies, as well as Mr. Wernick risk profile.   Membrane stabilizer: Has tried and failed gabapentin and amitriptyline.  Trial of Lyrica today.  Can consider Cymbalta or Effexor in the future  Muscle relaxant: To be determined at a later time trial of tizanidine today.  Can consider baclofen, Robaxin, Flexeril in the future  NSAID: Medically contraindicated  Other analgesic(s): To be determined at a later time    Provider-requested follow-up: Return in about 12 days (around 12/09/2017) for Medication  Management.  Time Note: Greater than 50% of the 25 minute(s) of face-to-face time spent with Mr. Brink, was spent in counseling/coordination of care regarding: Mr. Middleton primary cause of pain, the treatment plan, treatment alternatives, the appropriate use of his medications, realistic expectations and the goals of pain management (increased in functionality).  Future Appointments  Date Time Provider Will  12/02/2017  9:30 AM [facility] None  12/02/2017 10:00 AM Lequita Asal, MD [facility] None  12/09/2017  2:30 PM Noreene Filbert, MD [facility] None  12/11/2017 11:30 AM Gillis Santa, MD [facility] None    Primary Care Physician: Lorelee Market, MD Location: [facility] Note by: Gillis Santa, M.D Date: 11/27/2017; Time: 2:39 PM  There are no Patient Instructions on file for this visit.

## 2017-11-27 NOTE — Progress Notes (Signed)
Safety precautions to be maintained throughout the outpatient stay will include: orient to surroundings, keep bed in low position, maintain call bell within reach at all times, provide assistance with transfer out of bed and ambulation.  

## 2017-11-27 NOTE — Telephone Encounter (Signed)
Per Velna Hatchet 11/27/17 scheduler message to cancel CT for 11/28/17 And to R/S once approved.  I called patient x2 to make him aware. I left a message on his vmail that the scheduled 11/28/17 CT scan had been cancelled.

## 2017-11-28 ENCOUNTER — Other Ambulatory Visit: Payer: Self-pay | Admitting: Urgent Care

## 2017-11-28 ENCOUNTER — Encounter: Payer: Self-pay | Admitting: Urgent Care

## 2017-11-28 ENCOUNTER — Ambulatory Visit: Payer: Medicaid Other

## 2017-11-28 ENCOUNTER — Telehealth: Payer: Self-pay | Admitting: *Deleted

## 2017-11-28 LAB — GASTROINTESTINAL PANEL BY PCR, STOOL (REPLACES STOOL CULTURE)
ASTROVIRUS: NOT DETECTED
Adenovirus F40/41: NOT DETECTED
CAMPYLOBACTER SPECIES: NOT DETECTED
Cryptosporidium: NOT DETECTED
Cyclospora cayetanensis: NOT DETECTED
ENTEROTOXIGENIC E COLI (ETEC): NOT DETECTED
Entamoeba histolytica: NOT DETECTED
Enteroaggregative E coli (EAEC): NOT DETECTED
Enteropathogenic E coli (EPEC): DETECTED — AB
Giardia lamblia: NOT DETECTED
NOROVIRUS GI/GII: NOT DETECTED
Plesimonas shigelloides: NOT DETECTED
ROTAVIRUS A: NOT DETECTED
SALMONELLA SPECIES: NOT DETECTED
SAPOVIRUS (I, II, IV, AND V): NOT DETECTED
SHIGA LIKE TOXIN PRODUCING E COLI (STEC): NOT DETECTED
SHIGELLA/ENTEROINVASIVE E COLI (EIEC): NOT DETECTED
Vibrio cholerae: NOT DETECTED
Vibrio species: NOT DETECTED
Yersinia enterocolitica: NOT DETECTED

## 2017-11-28 MED ORDER — AZITHROMYCIN 500 MG PO TABS: ORAL_TABLET | ORAL | 0 refills | Status: DC

## 2017-11-28 NOTE — Telephone Encounter (Signed)
Patient states that he was told by clinic staff that an antibiotic would be called in for him today. He checked with pharmacy and the prescription has not been sent.

## 2017-11-28 NOTE — Addendum Note (Signed)
Addended by: Honor Loh on: 11/28/2017 09:10 AM   Modules accepted: Orders

## 2017-11-28 NOTE — Progress Notes (Signed)
[place]  11/28/17  Re: (+) GI panel results  Received call from lab this morning to advise of a (+) result on patient's recent GI panel by PCR. Stool study (+) for EPEC.   Orders Only on 11/27/2017  Component Date Value Ref Range Status  . C Diff antigen 11/27/2017 NEGATIVE  NEGATIVE Final  . C Diff toxin 11/27/2017 NEGATIVE  NEGATIVE Final  . C Diff interpretation 11/27/2017 No C. difficile detected.   Final   Performed at [facility], [address], [place], [place] [address]  . Campylobacter species 11/27/2017 NOT DETECTED  NOT DETECTED Final  . Plesimonas shigelloides 11/27/2017 NOT DETECTED  NOT DETECTED Final  . Salmonella species 11/27/2017 NOT DETECTED  NOT DETECTED Final  . Yersinia enterocolitica 11/27/2017 NOT DETECTED  NOT DETECTED Final  . Vibrio species 11/27/2017 NOT DETECTED  NOT DETECTED Final  . Vibrio cholerae 11/27/2017 NOT DETECTED  NOT DETECTED Final  . Enteroaggregative E coli (EAEC) 11/27/2017 NOT DETECTED  NOT DETECTED Final  . Enteropathogenic E coli (EPEC) 11/27/2017 DETECTED* NOT DETECTED Final   Comment: RESULT CALLED TO, READ BACK BY AND VERIFIED WITH:  Lizzie An AT [id number] 11/28/17 SDR   . Enterotoxigenic E coli (ETEC) 11/27/2017 NOT DETECTED  NOT DETECTED Final  . Shiga like toxin producing E coli * 11/27/2017 NOT DETECTED  NOT DETECTED Final  . Shigella/Enteroinvasive E coli (EI* 11/27/2017 NOT DETECTED  NOT DETECTED Final  . Cryptosporidium 11/27/2017 NOT DETECTED  NOT DETECTED Final  . Cyclospora cayetanensis 11/27/2017 NOT DETECTED  NOT DETECTED Final  . Entamoeba histolytica 11/27/2017 NOT DETECTED  NOT DETECTED Final  . Giardia lamblia 11/27/2017 NOT DETECTED  NOT DETECTED Final  . Adenovirus F40/41 11/27/2017 NOT DETECTED  NOT DETECTED Final  . Astrovirus 11/27/2017 NOT DETECTED  NOT DETECTED Final  . Norovirus GI/GII 11/27/2017 NOT DETECTED  NOT DETECTED Final  . Rotavirus A 11/27/2017 NOT DETECTED   NOT DETECTED Final  . Sapovirus (I, II, IV, and V) 11/27/2017 NOT DETECTED  NOT DETECTED Final   Performed at [facility], [address], [place], [place] [address]    Plan: 1. Diarrhea of infectious etiology (EPEC +) - acute  Allergy list assessed - no identified allergies to ABX.   Will treat with 3 day course of azithromycin 500 mg PO. Rx will be sent to patient's pharmacy.  Encourage patient to increase fluid intake using ORS (gatorade and pedialyte) to avoid dehydration and electrolyte derangements.  AVOID antidiarrheals.   Review handwashing precautions to prevent transmission and reinfection following treatment.   Return call to clinic for worsening symptoms or inability to consume adequate amounts of fluid.   RTC on 12/02/2017 for labs (CBC with diff, BMP, Mg) and +/- IVFs with K+ and Mg  Honor Loh, MSN, APRN, FNP-C, CEN Oncology/Hematology Nurse Practitioner  [name] 11/28/17, 8:42 AM

## 2017-11-28 NOTE — Telephone Encounter (Signed)
Called patient to inform him that his medication was called and it showed pharmacy received it.  However, we sent it again.

## 2017-11-28 NOTE — Telephone Encounter (Signed)
Called patient and LVM that the diarrhea he is having is infectious. Azithromycin 500 mg has been called to patient's pharmacy.  Patient will need to take for next 3 days.  Encouraged increasing fluids and including gatorade or pedialyte.  Instructed not to take antidiarreals.  Advised patient on strict handwashing.  Patient can call if he has worsening symptoms.  Otherwise, RTC on 12-02-17 for Labs/MD.

## 2017-11-28 NOTE — Telephone Encounter (Signed)
Prescription was sent this morning for Azithromycin 500 mg PO x 3 days for EPEC (+) stool. It says that they received it. I am not sure what happened. I have resent the prescription. Rodena Piety is following up with him to ensure that they now have it. Let me know if there are further issues. If so, we can print and fax it.   Honor Loh, MSN, APRN, FNP-C, CEN Oncology/Hematology Nurse Practitioner  [facility] 11/28/17, 1:33 PM

## 2017-12-02 ENCOUNTER — Inpatient Hospital Stay (HOSPITAL_BASED_OUTPATIENT_CLINIC_OR_DEPARTMENT_OTHER): Payer: Medicaid Other | Admitting: Hematology and Oncology

## 2017-12-02 ENCOUNTER — Encounter: Payer: Self-pay | Admitting: Hematology and Oncology

## 2017-12-02 ENCOUNTER — Inpatient Hospital Stay: Payer: Medicaid Other

## 2017-12-02 VITALS — BP 122/81 | HR 66 | Temp 97.1°F | Resp 18 | Ht 71.0 in | Wt 113.7 lb

## 2017-12-02 DIAGNOSIS — C3412 Malignant neoplasm of upper lobe, left bronchus or lung: Secondary | ICD-10-CM | POA: Diagnosis not present

## 2017-12-02 DIAGNOSIS — C7931 Secondary malignant neoplasm of brain: Secondary | ICD-10-CM

## 2017-12-02 DIAGNOSIS — E871 Hypo-osmolality and hyponatremia: Secondary | ICD-10-CM

## 2017-12-02 DIAGNOSIS — A040 Enteropathogenic Escherichia coli infection: Secondary | ICD-10-CM

## 2017-12-02 DIAGNOSIS — C3490 Malignant neoplasm of unspecified part of unspecified bronchus or lung: Secondary | ICD-10-CM

## 2017-12-02 DIAGNOSIS — R112 Nausea with vomiting, unspecified: Secondary | ICD-10-CM | POA: Diagnosis not present

## 2017-12-02 LAB — COMPREHENSIVE METABOLIC PANEL
ALT: 23 U/L (ref 0–44)
AST: 17 U/L (ref 15–41)
Albumin: 2.5 g/dL — ABNORMAL LOW (ref 3.5–5.0)
Alkaline Phosphatase: 62 U/L (ref 38–126)
Anion gap: 8 (ref 5–15)
BUN: 5 mg/dL — ABNORMAL LOW (ref 6–20)
CO2: 25 mmol/L (ref 22–32)
Calcium: 8.7 mg/dL — ABNORMAL LOW (ref 8.9–10.3)
Chloride: 99 mmol/L (ref 98–111)
Creatinine, Ser: 0.65 mg/dL (ref 0.61–1.24)
GFR calc Af Amer: >60 mL/min (ref >60)
GFR calc non Af Amer: >60 mL/min (ref >60)
Glucose, Bld: 71 mg/dL (ref 70–99)
Potassium: 3.6 mmol/L (ref 3.5–5.1)
Sodium: 132 mmol/L — ABNORMAL LOW (ref 135–145)
Total Bilirubin: 0.5 mg/dL (ref 0.3–1.2)
Total Protein: 4.9 g/dL — ABNORMAL LOW (ref 6.5–8.1)

## 2017-12-02 LAB — CBC WITH DIFFERENTIAL/PLATELET
Basophils Absolute: 0 10*3/uL (ref 0–0.1)
Basophils Relative: 0 %
Eosinophils Absolute: 0 10*3/uL (ref 0–0.7)
Eosinophils Relative: 0 %
HCT: 30.1 % — ABNORMAL LOW (ref 40.0–52.0)
Hemoglobin: 10.8 g/dL — ABNORMAL LOW (ref 13.0–18.0)
Lymphocytes Relative: 23 %
Lymphs Abs: 1 10*3/uL (ref 1.0–3.6)
MCH: 36.1 pg — ABNORMAL HIGH (ref 26.0–34.0)
MCHC: 35.8 g/dL (ref 32.0–36.0)
MCV: 100.9 fL — ABNORMAL HIGH (ref 80.0–100.0)
Monocytes Absolute: 0.4 10*3/uL (ref 0.2–1.0)
Monocytes Relative: 8 %
Neutro Abs: 2.9 10*3/uL (ref 1.4–6.5)
Neutrophils Relative %: 69 %
Platelets: 182 10*3/uL (ref 150–440)
RBC: 2.99 MIL/uL — ABNORMAL LOW (ref 4.40–5.90)
RDW: 14.3 % (ref 11.5–14.5)
WBC: 4.2 10*3/uL (ref 3.8–10.6)

## 2017-12-02 LAB — COMPLIANCE DRUG ANALYSIS, UR

## 2017-12-02 LAB — MAGNESIUM: Magnesium: 1.7 mg/dL (ref 1.7–2.4)

## 2017-12-02 NOTE — Progress Notes (Signed)
[facility] day:  12/02/2017  Chief Complaint: Philip [name] is a 57 y.o. male with extensive stage small cell lung cancer who is seen for review of interval imaging and discussion regarding direction of therapy.  HPI:  The patient was last seen in the medical oncology clinic on 11/07/2017.  At that time, he was tapering off of Decadron.  Weight was down.  He had been seen by the pain clinic.  Lyrica had been prescribed, but had not been picked up secondary to cost issues.  PET scan on 10/18/2017 revealed a new small lesion in his chest of unclear significance.  Decision was made to pursue a follow-up chest CT in 6 weeks.  At last visit, sodium was low (127).  Sodium tablets were restarted.  He was scheduled to follow-up with Dr. Candiss Norse.  He was seen by Faythe Casa, NP in the symptom management clinic on 11/22/2017 for weakness, nausea, and diarrhea.  Symptoms were possibly related to adrenal insufficiency.  He was placed on a Decadron taper.  Sodium was 133, magnesium 1.4, and potassium 3.3.  He received 1 liter NS + 4 gm of magnesium and 10 mEq of potassium.  GI panel by PCR on 11/27/2017 revealed enteropathogenic E. coli (EPEC).  He received  Azithromycin x 3 days beginning 11/28/2017.  During the interim, patient is doing well overall, with the exception of his pain control. He is no longer having diarrhea following treatment with the antibiotics. Patient notes that he has fallen again. He states, "I fall forward. The last time, I fell into the oven trying to cook dinner". Patient notes he is having issues with his feet. Patient states, "my toes turn black sometimes". Capillary refill on RIGHT foot is reduced (> 4 seconds) and his foot is cold. Symptoms are not present contralaterally.    Patient continues to have pain. He is seeing pain management. Patient being seen by Dr. Holley Raring (pain management) and currently being consider for COT. Notes  have  He states, "If he don't do anything this time, I am going to stop going". Patient has been on tizanidine and methocarbamol, in addition to escalating doses of Lyrica, however notes that all interventions have been ineffective. In order for COT to be considered, patient is required to have a negative UDS.   Patient continues to be fatigued. He continues on Decadron taper (2 mg). Patient states, "it is getting harder for me to stand and walk. When I stand up, I get so dizzy".    Past Medical History:  Diagnosis Date  . Anemia   . CAD (coronary artery disease)    a. 08/2014 Inf STEMI/CABG x 3 (LIMA->LAD, VG->Diag, RIMA->RCA).  . DVT, recurrent, lower extremity, acute, left ([place]) 2016  . GIB (gastrointestinal bleeding)    a. In the setting of DAPT-->tolerating plavix only.  . Hyperlipidemia   . Hypertensive heart disease   . Lung cancer ([place])    a. s/p chemo/radiation.  Marland Kitchen PAD (peripheral artery disease) ([place])    a. 03/2015 Periph Angio: short occlusion of L pop w/ evidence of embolization into the DP->5.0x50 mm Inova self-expanding stent;  b. 04/08/2015 ABI: R 1.12, L 0.99; c. 01/2017 LE Duplex: bilat SFA obstructive dzs, patent L pop.  . Tobacco abuse   . Toe amputation status, left [facility]) 07/09/2016   2017    Past Surgical History:  Procedure Laterality Date  . CARDIAC CATHETERIZATION N/A 08/24/2014   Procedure: Left Heart Cath  and Coronary Angiography;  Surgeon: Isaias Cowman, MD;  Location: [place] [facility];  Service: Cardiovascular;  Laterality: N/A;  . CARDIAC CATHETERIZATION N/A 08/24/2014   Procedure: Coronary Stent Intervention;  Surgeon: Isaias Cowman, MD;  Location: [place] [facility];  Service: Cardiovascular;  Laterality: N/A;  . CORONARY ARTERY BYPASS GRAFT N/A 08/25/2014   Procedure: CORONARY ARTERY BYPASS GRAFTING (CABG), ON PUMP, TIMES THREE, USING BILATERAL MAMMARY ARTERIES, RIGHT GREATER SAPHENOUS VEIN HARVESTED ENDOSCOPICALLY;  Surgeon: Melrose Nakayama, MD;  Location: [place];  Service: Open Heart Surgery;  Laterality: N/A;  . ESOPHAGOGASTRODUODENOSCOPY (EGD) WITH PROPOFOL N/A 08/08/2017   Procedure: ESOPHAGOGASTRODUODENOSCOPY (EGD) WITH PROPOFOL;  Surgeon: Jonathon Bellows, MD;  Location: [facility] ENDOSCOPY;  Service: Gastroenterology;  Laterality: N/A;  . INCISION AND DRAINAGE ABSCESS Left 11/15/2016   Procedure: INCISION AND DRAINAGE ABSCESS OF LEFT WRIST;  Surgeon: Roseanne Kaufman, MD;  Location: [place];  Service: Orthopedics;  Laterality: Left;  . PEG PLACEMENT N/A 10/01/2017   Procedure: PERCUTANEOUS ENDOSCOPIC GASTROSTOMY (PEG) PLACEMENT;  Surgeon: Lucilla Lame, MD;  Location: [facility] ENDOSCOPY;  Service: Endoscopy;  Laterality: N/A;  . PERIPHERAL VASCULAR CATHETERIZATION N/A 03/30/2015   Procedure: Abdominal Aortogram w/Lower Extremity;  Surgeon: Wellington Hampshire, MD;  Location: [place] [facility];  Service: Cardiovascular;  Laterality: N/A;  . PORTA CATH INSERTION N/A 07/27/2016   Procedure: Glori Luis Cath Insertion;  Surgeon: Algernon Huxley, MD;  Location: [place] [facility];  Service: Cardiovascular;  Laterality: N/A;  . TEE WITHOUT CARDIOVERSION N/A 08/25/2014   Procedure: TRANSESOPHAGEAL ECHOCARDIOGRAM (TEE);  Surgeon: Melrose Nakayama, MD;  Location: [place];  Service: Open Heart Surgery;  Laterality: N/A;    Family History  Problem Relation Age of Onset  . Hypertension Father   . Lung cancer Father   . Heart attack Mother 69       STENT  . Hyperlipidemia Mother   . Heart attack Brother   . Heart attack Brother   . Breast cancer Sister   . Heart attack Sister   . Colon cancer Paternal Grandfather     Social History:  reports that he has been smoking cigarettes. He has been smoking about 0.25 packs per day. He has never used smokeless tobacco. He reports that he drinks alcohol. He reports that he does not use drugs.  The patient smokes 1/2 pack of cigarettes/day (previously 2 1/2 ppd).  He started smoking in his teens.  He denies any  exposure to radiation or toxins.  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  He lives in [place].  His wife is named Philip [name].  He is accompanied by his wife today.  Allergies:  Allergies  Allergen Reactions  . Tylenol [Acetaminophen] Palpitations  . Amitriptyline Other (See Comments)    Burning sensation all over  . Aspirin   . Contrast Media [Iodinated Diagnostic Agents] Other (See Comments)    Pt states that he got "knots behind his ears"  . Nsaids Other (See Comments)    Blood in stools  . Ibuprofen Other (See Comments) and Nausea Only    Blood in stools  . Tape Rash and Other (See Comments)    PLEASE USE COBAN WRAP; THE PATIENT'S SKIN TEARS WHEN BANDAGES ARE REMOVED!!    Current Medications: Current Outpatient Medications  Medication Sig Dispense Refill  . atorvastatin (LIPITOR) 40 MG tablet TAKE 1 TABLET BY MOUTH DAILY 90 tablet 3  . Calcium Carb-Cholecalciferol (CALCIUM + D3 PO) Take 1 tablet by  mouth 3 (three) times daily.    . clopidogrel (PLAVIX) 75 MG tablet Take 1 tablet (75 mg total) by mouth daily. 90 tablet 3  . famotidine (PEPCID) 20 MG tablet Take 1 tablet (20 mg total) by mouth 2 (two) times daily. 60 tablet 0  . feeding supplement, ENSURE ENLIVE, (ENSURE ENLIVE) LIQD Take 237 mLs by mouth 3 (three) times daily between meals. 237 mL 12  . FLOVENT HFA 220 MCG/ACT inhaler INL 2 PFS PO BID  5  . folic acid (FOLVITE) 1 MG tablet Take 1 tablet (1 mg total) by mouth daily. 30 tablet 0  . hydrochlorothiazide (HYDRODIURIL) 12.5 MG tablet Take 1 tablet by mouth daily.  2  . magnesium oxide (MAG-OX) 400 (241.3 Mg) MG tablet Take 1 tablet (400 mg total) by mouth 3 (three) times daily. 90 tablet 3  . methocarbamol (ROBAXIN) 500 MG tablet Take 1 tablet (500 mg total) by mouth every 8 (eight) hours as needed for muscle spasms. 90 tablet 1  . metoCLOPramide (REGLAN) 5 MG tablet Take 1 tablet (5 mg total) by mouth 4 (four) times daily -  before meals and at bedtime. 100  tablet 0  . metoprolol tartrate (LOPRESSOR) 25 MG tablet TAKE 1 TABLET BY MOUTH TWICE DAILY 180 tablet 3  . Multiple Vitamin (MULTIVITAMIN WITH MINERALS) TABS tablet Take 1 tablet by mouth daily. 30 tablet 1  . pantoprazole (PROTONIX) 40 MG tablet TK 1 T PO QD  5  . potassium phosphate, monobasic, (K-PHOS ORIGINAL) 500 MG tablet Take 1 tablet (500 mg total) by mouth 4 (four) times daily -  with meals and at bedtime. 10 tablet 0  . pregabalin (LYRICA) 50 MG capsule 50 mg qhs x 1 week, then 50 mg BID week 2, then 50 mg TID thereafter 90 capsule 1  . sodium chloride 1 g tablet Take 1 tablet (1 g total) by mouth 2 (two) times daily with a meal. 60 tablet 0  . STIOLTO RESPIMAT 2.5-2.5 MCG/ACT AERS INL 2 PFS PO QD  5  . sucralfate (CARAFATE) 1 g tablet Take 1 tablet (1 g total) by mouth 3 (three) times daily. 90 tablet 1  . thiamine 100 MG tablet Take 1 tablet (100 mg total) by mouth daily. 30 tablet 1  . vitamin C (VITAMIN C) 500 MG tablet Take 1 tablet (500 mg total) by mouth 2 (two) times daily. 60 tablet 1  . Vitamin D, Ergocalciferol, (DRISDOL) 50000 units CAPS capsule TK ONE C PO Q WEEK  5  . amoxicillin-clavulanate (AUGMENTIN) 875-125 MG tablet Take 1 tablet by mouth 2 (two) times daily. (Patient not taking: Reported on 12/02/2017) 14 tablet 0  . azithromycin (ZITHROMAX) 500 MG tablet 1 tab (500 mg) PO x 3 days. (Patient not taking: Reported on 12/02/2017) 3 tablet 0  . dexamethasone (DECADRON) 4 MG tablet Take 1 tablet (4 mg total) by mouth daily. Take 1/2 tablet x 7 days, then 1/2 tablet every other day until finished. (Patient not taking: Reported on 12/02/2017) 10 tablet 0  . diphenhydrAMINE (BENADRYL) 25 mg capsule Take 25 mg by mouth 2 (two) times daily as needed for allergies.     Marland Kitchen lidocaine-prilocaine (EMLA) cream Apply to affected area once (Patient not taking: Reported on 12/02/2017) 30 g 3  . ondansetron (ZOFRAN-ODT) 8 MG disintegrating tablet Take 1 tablet (8 mg total) by mouth every 8  (eight) hours as needed for nausea or vomiting. (Patient not taking: Reported on 12/02/2017) 30 tablet 1  .  PROAIR HFA 108 (90 Base) MCG/ACT inhaler INL 2 PFS PO Q 4 TO 6 H PRN  5  . promethazine (PHENERGAN) 25 MG suppository Place 1 suppository (25 mg total) rectally every 6 (six) hours as needed for nausea or vomiting. (Patient not taking: Reported on 12/02/2017) 12 each 5  . senna-docusate (SENOKOT-S) 8.6-50 MG tablet Take 1 tablet by mouth at bedtime as needed for mild constipation. (Patient not taking: Reported on 12/02/2017) 30 tablet 0   No current facility-administered medications for this visit.    Facility-Administered Medications Ordered in Other Visits  Medication Dose Route Frequency Provider Last Rate Last Dose  . heparin lock flush 100 unit/mL  500 Units Intravenous Once Corcoran, Melissa C, MD      . heparin lock flush 100 unit/mL  500 Units Intravenous Once Corcoran, Melissa C, MD      . sodium chloride 0.9 % 1,000 mL with potassium chloride 10 mEq, magnesium sulfate 4 g infusion   Intravenous Continuous Jacquelin Hawking, NP   Stopped at 11/22/17 1201  . sodium chloride flush (NS) 0.9 % injection 10 mL  10 mL Intravenous PRN Lequita Asal, MD   10 mL at 09/10/17 0847  . sodium chloride flush (NS) 0.9 % injection 10 mL  10 mL Intravenous PRN Jacquelin Hawking, NP        Review of Systems  Constitutional: Positive for malaise/fatigue. Negative for diaphoresis, fever and weight loss (up 2 pounds).  HENT: Negative.   Eyes: Negative.   Respiratory: Negative for cough, hemoptysis, sputum production and shortness of breath.   Cardiovascular: Positive for claudication (chronic - followed by cardiology for known PVD) and leg swelling (bilateral ankle edema). Negative for chest pain, palpitations, orthopnea and PND.       Issues with feet (R>L); ?? reduced circulation related to PVD.   Gastrointestinal: Positive for nausea (chronic ). Negative for abdominal pain, blood in stool,  constipation, diarrhea, melena and vomiting.  Genitourinary: Negative for dysuria, frequency, hematuria and urgency.  Musculoskeletal: Positive for back pain (chronic), falls and joint pain (kness and hips). Negative for myalgias.       BLE pain related to known PVD  Skin: Negative for itching and rash.  Neurological: Positive for dizziness and weakness (generalized). Negative for tremors and headaches.  Endo/Heme/Allergies: Does not bruise/bleed easily.  Psychiatric/Behavioral: Negative for depression, memory loss and suicidal ideas. The patient is not nervous/anxious and does not have insomnia.   All other systems reviewed and are negative.  Performance status (ECOG): 2 - Symptomatic, <50% confined to bed  Vital Signs BP 122/81 (BP Location: Left Arm, Patient Position: Sitting)   Pulse 66   Temp (!) 97.1 F (36.2 C) (Tympanic)   Resp 18   Ht '5\' 11"'  (1.803 m)   Wt 113 lb 11.2 oz (51.6 kg)   SpO2 99%   BMI 15.86 kg/m   Physical Exam  Constitutional: He is oriented to person, place, and time and well-developed, well-nourished, and in no distress.  HENT:  Head: Normocephalic and atraumatic.  Eyes: Pupils are equal, round, and reactive to light. EOM are normal. No scleral icterus.  Brown eyes.  Neck: Normal range of motion. Neck supple. No tracheal deviation present. No thyromegaly present.  Cardiovascular: Normal rate, regular rhythm and normal heart sounds. Exam reveals no gallop and no friction rub.  No murmur heard. RIGHT foot cool with reduced capillary refill (> 4 seconds). LEFT foot warm with a normal capillary refill. Known PVD. Toes "  turn black" sometimes.   Pulmonary/Chest: Effort normal and breath sounds normal. No respiratory distress. He has no wheezes. He has no rales.  Abdominal: Soft. Bowel sounds are normal. He exhibits no distension. There is no tenderness.  Scaphoid abdomen.   Musculoskeletal: Normal range of motion. He exhibits no edema or tenderness.   Lymphadenopathy:    He has no cervical adenopathy.    He has no axillary adenopathy.       Right: No inguinal and no supraclavicular adenopathy present.       Left: No inguinal and no supraclavicular adenopathy present.  Neurological: He is alert and oriented to person, place, and time.  Skin: Skin is warm and dry. No rash noted. No erythema.  Psychiatric: Mood, affect and judgment normal.  Nursing note and vitals reviewed.   Appointment on 12/02/2017  Component Date Value Ref Range Status  . Magnesium 12/02/2017 1.7  1.7 - 2.4 mg/dL Final   Performed at [facility], [address] [place], [place] [address]  . Sodium 12/02/2017 132* 135 - 145 mmol/L Final  . Potassium 12/02/2017 3.6  3.5 - 5.1 mmol/L Final  . Chloride 12/02/2017 99  98 - 111 mmol/L Final  . CO2 12/02/2017 25  22 - 32 mmol/L Final  . Glucose, Bld 12/02/2017 71  70 - 99 mg/dL Final  . BUN 12/02/2017 5* 6 - 20 mg/dL Final  . Creatinine, Ser 12/02/2017 0.65  0.61 - 1.24 mg/dL Final  . Calcium 12/02/2017 8.7* 8.9 - 10.3 mg/dL Final  . Total Protein 12/02/2017 4.9* 6.5 - 8.1 g/dL Final  . Albumin 12/02/2017 2.5* 3.5 - 5.0 g/dL Final  . AST 12/02/2017 17  15 - 41 U/L Final  . ALT 12/02/2017 23  0 - 44 U/L Final  . Alkaline Phosphatase 12/02/2017 62  38 - 126 U/L Final  . Total Bilirubin 12/02/2017 0.5  0.3 - 1.2 mg/dL Final  . GFR calc non Af Amer 12/02/2017 >60  >60 mL/min Final  . GFR calc Af Amer 12/02/2017 >60  >60 mL/min Final   Comment: (NOTE) The eGFR has been calculated using the CKD EPI equation. This calculation has not been validated in all clinical situations. eGFR's persistently <60 mL/min signify possible Chronic Kidney Disease.   Philip [name] gap 12/02/2017 8  5 - 15 Final   Performed at [facility], [address] [place], [place] [address]  . WBC 12/02/2017 4.2  3.8 - 10.6 K/uL Final  . RBC 12/02/2017 2.99* 4.40 - 5.90 MIL/uL Final  . Hemoglobin 12/02/2017 10.8* 13.0 - 18.0 g/dL  Final  . HCT 12/02/2017 30.1* 40.0 - 52.0 % Final  . MCV 12/02/2017 100.9* 80.0 - 100.0 fL Final  . MCH 12/02/2017 36.1* 26.0 - 34.0 pg Final  . MCHC 12/02/2017 35.8  32.0 - 36.0 g/dL Final  . RDW 12/02/2017 14.3  11.5 - 14.5 % Final  . Platelets 12/02/2017 182  150 - 440 K/uL Final  . Neutrophils Relative % 12/02/2017 69  % Final  . Neutro Abs 12/02/2017 2.9  1.4 - 6.5 K/uL Final  . Lymphocytes Relative 12/02/2017 23  % Final  . Lymphs Abs 12/02/2017 1.0  1.0 - 3.6 K/uL Final  . Monocytes Relative 12/02/2017 8  % Final  . Monocytes Absolute 12/02/2017 0.4  0.2 - 1.0 K/uL Final  . Eosinophils Relative 12/02/2017 0  % Final  . Eosinophils Absolute 12/02/2017 0.0  0 - 0.7 K/uL Final  . Basophils Relative 12/02/2017 0  %  Final  . Basophils Absolute 12/02/2017 0.0  0 - 0.1 K/uL Final   Performed at [facility], [address] [place], [place] [address]    Assessment:  JANATHAN BRIBIESCA is a 57 y.o. male with extensive stage small cell lung cancer.  He presented with a 2 year history of night sweats and a 36 pound weight loss in 6 months.  Right supraclavicular biopsy on 07/19/2016 revealed small cell carcinoma of the lung.  CEA was 1.7 on 07/31/2016.  Chest CT on 06/28/2016 revealed extensive adenopathy.  There were multiple lymph nodes descending thoracic aorta, largest measuring 2.1 x 1.7 cm.  There was adenopathy in the aortopulmonary window with the largest confluence of lymph nodes measuring 4 x 2.6 cm. There was hilar adenopathy on the left measuring 2 x 1.9 cm. There were multiple enlarged lymph nodes in the pretracheal region, largest 1.7 x 1.7 cm.  There were lymph nodes to the left of the carina measuring 2.8 x 1.9 cm. There was a subcarinal lymph node measuring 2.4 x 1.6 cm. There was a 4 x 3 mm nodular opacity in the anterior segment of the left upper lobe.    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  There was nodal  metastasis within the chest and supraclavicular/low jugular regions.  There was multifocal osseous metastasis.  There was possibly a pathologic fracture involving the posteromedial right tenth rib.  There was new left lower lobe opacity with mild hypermetabolism, suspicious for infection or postobstructive pneumonitis.  He completed a course of Levaquin for apost-obstructive pneumonia.  He experienced transient positional left facial burning.  Head MRI on 07/26/2016 revealed no evidence of brain metastasis. There was punctate DWI hyperintensity along a high right frontal gyrus, possible recent infarct.  There was a small occipital bone metastasis.  Head MRA on 07/27/2016 revealed normal large and medium sized vessels.  Bilateral carotid duplex on 07/27/2016 revealed <50% stenosis in the right and left internal carotis arteries.  Echo on 07/27/2016 revealed an EF of 45-50% without cardiac source of emboli.  He received 4 cycles of carboplatin and etoposide (07/31/2016 - 10/02/2016) with OnPro Neulasta support.  Platelet nadir was 40,000 on day 16 of cycle #2 and 23,000 on day 15 of cycle #3.  He received 2 units of PRBC with cycle #3 and 1 unit with cycle #4.  He received Xgeva on 08/13/2016 (last 10/30/2016).  Chest CT on 09/11/2016 revealed a marked response to therapy.  There was near complete resolution of left suprahilar mass and thoracic adenopathy.  There was interval sclerosis indicative of healing site of osseous metastasis.  Thoracic spine MRI on 10/22/2016 reveals sclerotic enhancing lesion at T7 vertebral body on the left compatible with metastasis. There was a healing fracture the right medial 10th rib.  PET scan on 11/12/2016 revealed a new 10 mm hypermetabolic left axillary lymph node (SUV 3.6) due to a left hand abscess.  There were no other sites of metabolically active disease.  He completed a course of thoracic spine radiation (3000 cGy in 10 fractions) on 01/11/2017.  He received PCI  (3000 cGy in 15 fractions) from 01/28/2017 - 02/15/2017.  Chest, abdomen, and pelvic CT on 03/18/2017 revealed new areas of irregular ground-glass in the medial aspects of the right upper and right lower lobes as well as somewhat amorphous ground-glass in the lingula and left lower lobe. Time interval favored infectious or inflammatory lesions.  Osseous metastatic disease was stable.  There was resolved or resected left axillary lymph node.  Bone scan on 04/10/2017 revealed multiple foci of skeletal metastasis within the ribs, pelvis, and RIGHT clavicle.  Multiple areas of activity corresponding to sclerotic lesions on CT and non-hypermetabolic lesions on PET scan.  PET scan on 04/26/2017 revealed bandlike densities medially in the right upper lobe and medially in the right lower lobe were new from 11/12/2016 but were present on 03/18/2017, and had faint hypermetabolic activity. Radiation pneumonitis could have this appearance.  The morphology would be unusual for malignancy but surveillance was warranted.  There were scattered stable sclerotic bony lesions that were not hypermetabolic.  There was persistent accentuated activity in the vicinity of the anus may be physiologic but was technically nonspecific.  LEFT hip radiograph on 09/02/2017 revealed degenerative changes in the bilateral hip joints, with the left being more significant than the right. There was no mention of new osseous lytic lesions.  Bone scan on 10/02/2017 revealed multiple sites (sternum, manubrium, LEFT scapula, BILATERAL anterior and posterior ribs, RIGHT L3 vertebral body, superior LEFT SI joint, and anterior LEFT iliac bone) of abnormal osseous tracer accumulation consistent with osseous metastatic disease, with 2 sites of subtle new uptake in the anterior RIGHT approximately fourth rib.  He fell on his right side prior to imaging.  PET scan on 10/18/2017 revealed a new small nodular density within the anteromedial left upper  lobe which exhibits mild to moderate increased uptake suspicious for metastatic disease.  There was no additional foci of abnormal FDG uptake identified.  There was scattered sclerotic bone lesions without corresponding FDG uptake to suggest metabolically active bone metastases.  He has chemotherapy induced anemia.  Anemia work-up on 09/11/2016 revealed the following normal labs:  Ferritin (219), iron sat (28%), TIBC (227), B12 (3055), and folate (6.8).  He has folate deficiency.  Folate was 5.9 on 08/27/2017 and 17 on 10/18/2017.  B12 was 542 on 08/27/2017.  He has a history of left lower extremity DVT a few months after his cardiac surgery in 08/2014.  He has peripheral vascular disease s/p stent placement in 03/2015.  He is on a Plavix.  He has not had a colonoscopy in 30 years.  He was ineligible for the AbbVie clinical trial ([id number]) secondary to his left hand abscess.  He underwent surgical debridement of a left hand abscess on 11/15/2016.  Wound cultures grew MRSA.  He was treated with emperic IV Vancomycin then Bactrim.   He was admitted to [facility] from 08/26/2017 - 08/30/2017 with nausea, vomiting, dehydration and electrolyte abnormalities.  Head MRI without contrast on 08/29/2017 revealed no evidence of metastatic disease. Prior UGI revealed no evidence of obstruction or gastroparesis.  Labs revealed no evidence of pancreatitis.  He was started on scheduled antiemetics and PPI BID.  AM cortisol on 08/28/2017 was low.  He was diagnosed with adrenal insufficiency.  He was started on prednisone 40 mg a day.  He was able to advance his diet.  Tube feeds were avoided.  Symptomatically, he continues to be fatigued. Chronic pain persists. Patient under the care of pain management at this time. He notes marked weakness and recurrent atraumatic falls. Patient complains of "issues with his feet". He notes that toes "turn black". Exam reveals decreased capillary refill to RIGHT foot. Foot is cool to touch.    Plan: 1. Labs today: CBC with diff, CMP, Mg. 2. Extensive stage SCLC  Recent PET imaging (+) for small nodular density in the LUL.   Denies increased  SOB. (+) progressive weight loss.  Repeat CT scan ordered (6 weeks following PET), however denied by insurance. Will submit again for review with peer to peer if needed.  3. EPEC (+) stool - resolved  Completed 3 day course of azithromycin 500 mg with no recurrent episodes of diarrhea.   Encouraged to continue ORS to improve electrolyte levels.  4. HYPOnatremia - chronic  Sodium remains low at 132.   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   Encouraged ORS therapy (Gatorade or Pedialyte). No concerns for HTN, therefore patient encouraged to increase dietary sodium intake. Currently on NaCl 1,000 mg tablets BID.    Patient followed by nephrology Candiss Norse, MD).  5. PVD - acute exacerbation  Patient with altered temperature in RIGHT lower extremity (foot). He notes toes will "turn black" at times. Capillary refill reduced and found to be > 4 seconds.   He has known PVD and is followed by Dr. Fletcher Anon (cardiology). Appointment in September. Call placed to cardiology office to discuss. Patient's appointment able to be expedited. He will now be seen in clinic on 12/09/2017 at 1400.  6. RTC after CT scan for MD assessment and review of imaging.    Honor Loh, NP  12/02/17, 10:49 AM   I saw and evaluated the patient, participating in the key portions of the service and reviewing pertinent diagnostic studies and records.  I reviewed the nurse practitioner's note and agree with the findings and the plan.  The assessment and plan were discussed with the patient.  Multiple questions were asked by the patient and answered.   Nolon Stalls, MD 12/02/2017,10:49 AM

## 2017-12-08 DIAGNOSIS — R911 Solitary pulmonary nodule: Secondary | ICD-10-CM | POA: Insufficient documentation

## 2017-12-08 NOTE — Progress Notes (Signed)
Cardiology Office Note Date:  12/09/2017  Patient ID:  Philip [name], DOB 11-21-1960, MRN [id number] PCP:  Lorelee Market, MD  Cardiologist:  Dr. Fletcher Anon, MD    Chief Complaint: Follow up PVD  History of Present Illness: Philip [name] is a 57 y.o. male with history of CAD with an inferior STEMI in 08/2014 s/p 3-vessel CABG with a LIMA to LAD, SVG to D2, and RIMA to RCA, PAD s/p left popliteal artery self-expanding stent placement with embolization into the dorsalis pedis, GI bleed on DAPT currently on Plavix without ASA, metastatic small cell lung cancer treated with radiation and chemotherapy with recent PET scan showing a new small nodular density in the LUL with planned repeat CT per Oncology, HTN, HLD, chronic hyponatremia followed by nephrology, and tobacco abuse who presents for follow up of PVD.  He was last seen in the office in 08/2017 and was noted to have a decreased appetite, with loss of 70-80 pounds over the prior 2 years. He noted severe bilateral foot pain and swelling which was though to be secondary to peripheral neuropathy. He underwent noninvasive vascular evaluation in 01/2017 that showed normal ABI on the right side (essentially unchanged compared to 03/2015 study), abnormal right TBI,  and a mildly reduced ABIs and TWIs on the left side (new when compared to 03/2015 study) with patent left popliteal artery stent and significant stenosis in the mid left SFA when compared to study in 03/2015. He was seen in the [place] for follow up on 8/19 and noted intermittent coolness of the right foot. He was noted to have a decreased capillary refill. His appointment was moved up to today.   He comes in accompanied by his wife today. He notes over the past week a significant decline in his ability to weight bare. He is now unable to stand secondary to profound lower extremity weakness and pain. There are bo symptoms of claudication as is is unable to ambulate secondary to  weakness. he has no appetite. He has noticed profound atrophy of all his muscles. No chest pain or increased SOB. He describes an intermittent bruising affecting the bilateral feet.    Past Medical History:  Diagnosis Date  . Anemia   . CAD (coronary artery disease)    a. 08/2014 Inf STEMI/CABG x 3 (LIMA->LAD, VG->Diag, RIMA->RCA).  . DVT, recurrent, lower extremity, acute, left ([place]) 2016  . GIB (gastrointestinal bleeding)    a. In the setting of DAPT-->tolerating plavix only.  . Hyperlipidemia   . Hypertensive heart disease   . Lung cancer ([place])    a. s/p chemo/radiation.  Marland Kitchen PAD (peripheral artery disease) ([place])    a. 03/2015 Periph Angio: short occlusion of L pop w/ evidence of embolization into the DP->5.0x50 mm Inova self-expanding stent;  b. 04/08/2015 ABI: R 1.12, L 0.99; c. 01/2017 LE Duplex: bilat SFA obstructive dzs, patent L pop.  . Tobacco abuse   . Toe amputation status, left [facility]) 07/09/2016   2017    Past Surgical History:  Procedure Laterality Date  . CARDIAC CATHETERIZATION N/A 08/24/2014   Procedure: Left Heart Cath and Coronary Angiography;  Surgeon: Isaias Cowman, MD;  Location: [place] CV LAB;  Service: Cardiovascular;  Laterality: N/A;  . CARDIAC CATHETERIZATION N/A 08/24/2014   Procedure: Coronary Stent Intervention;  Surgeon: Isaias Cowman, MD;  Location: [place] [facility];  Service: Cardiovascular;  Laterality: N/A;  . CORONARY ARTERY BYPASS GRAFT N/A 08/25/2014   Procedure: CORONARY ARTERY BYPASS GRAFTING (CABG),  ON PUMP, TIMES THREE, USING BILATERAL MAMMARY ARTERIES, RIGHT GREATER SAPHENOUS VEIN HARVESTED ENDOSCOPICALLY;  Surgeon: Melrose Nakayama, MD;  Location: [place];  Service: Open Heart Surgery;  Laterality: N/A;  . ESOPHAGOGASTRODUODENOSCOPY (EGD) WITH PROPOFOL N/A 08/08/2017   Procedure: ESOPHAGOGASTRODUODENOSCOPY (EGD) WITH PROPOFOL;  Surgeon: Jonathon Bellows, MD;  Location: [facility] ENDOSCOPY;  Service: Gastroenterology;  Laterality: N/A;    . INCISION AND DRAINAGE ABSCESS Left 11/15/2016   Procedure: INCISION AND DRAINAGE ABSCESS OF LEFT WRIST;  Surgeon: Roseanne Kaufman, MD;  Location: [place];  Service: Orthopedics;  Laterality: Left;  . PEG PLACEMENT N/A 10/01/2017   Procedure: PERCUTANEOUS ENDOSCOPIC GASTROSTOMY (PEG) PLACEMENT;  Surgeon: Lucilla Lame, MD;  Location: [facility] ENDOSCOPY;  Service: Endoscopy;  Laterality: N/A;  . PERIPHERAL VASCULAR CATHETERIZATION N/A 03/30/2015   Procedure: Abdominal Aortogram w/Lower Extremity;  Surgeon: Wellington Hampshire, MD;  Location: [place] CV LAB;  Service: Cardiovascular;  Laterality: N/A;  . PORTA CATH INSERTION N/A 07/27/2016   Procedure: Glori Luis Cath Insertion;  Surgeon: Algernon Huxley, MD;  Location: [place] CV LAB;  Service: Cardiovascular;  Laterality: N/A;  . TEE WITHOUT CARDIOVERSION N/A 08/25/2014   Procedure: TRANSESOPHAGEAL ECHOCARDIOGRAM (TEE);  Surgeon: Melrose Nakayama, MD;  Location: [place];  Service: Open Heart Surgery;  Laterality: N/A;    Current Meds  Medication Sig  . atorvastatin (LIPITOR) 40 MG tablet TAKE 1 TABLET BY MOUTH DAILY  . Calcium Carb-Cholecalciferol (CALCIUM + D3 PO) Take 1 tablet by mouth 3 (three) times daily.  . clopidogrel (PLAVIX) 75 MG tablet Take 1 tablet (75 mg total) by mouth daily.  . diphenhydrAMINE (BENADRYL) 25 mg capsule Take 25 mg by mouth 2 (two) times daily as needed for allergies.   . famotidine (PEPCID) 20 MG tablet Take 1 tablet (20 mg total) by mouth 2 (two) times daily.  . feeding supplement, ENSURE ENLIVE, (ENSURE ENLIVE) LIQD Take 237 mLs by mouth 3 (three) times daily between meals.  Marland Kitchen FLOVENT HFA 220 MCG/ACT inhaler INL 2 PFS PO BID  . folic acid (FOLVITE) 1 MG tablet Take 1 tablet (1 mg total) by mouth daily.  . hydrochlorothiazide (HYDRODIURIL) 12.5 MG tablet Take 1 tablet by mouth daily.  Marland Kitchen lidocaine-prilocaine (EMLA) cream Apply to affected area once  . magnesium oxide (MAG-OX) 400 (241.3 Mg) MG tablet Take 1 tablet (400 mg  total) by mouth 3 (three) times daily.  . methocarbamol (ROBAXIN) 500 MG tablet Take 1 tablet (500 mg total) by mouth every 8 (eight) hours as needed for muscle spasms.  . metoCLOPramide (REGLAN) 5 MG tablet Take 1 tablet (5 mg total) by mouth 4 (four) times daily -  before meals and at bedtime.  . metoprolol tartrate (LOPRESSOR) 25 MG tablet TAKE 1 TABLET BY MOUTH TWICE DAILY  . Multiple Vitamin (MULTIVITAMIN WITH MINERALS) TABS tablet Take 1 tablet by mouth daily.  . ondansetron (ZOFRAN-ODT) 8 MG disintegrating tablet Take 1 tablet (8 mg total) by mouth every 8 (eight) hours as needed for nausea or vomiting.  . pantoprazole (PROTONIX) 40 MG tablet TK 1 T PO QD  . potassium phosphate, monobasic, (K-PHOS ORIGINAL) 500 MG tablet Take 1 tablet (500 mg total) by mouth 4 (four) times daily -  with meals and at bedtime.  . pregabalin (LYRICA) 50 MG capsule 50 mg qhs x 1 week, then 50 mg BID week 2, then 50 mg TID thereafter  . PROAIR HFA 108 (90 Base) MCG/ACT inhaler INL 2 PFS PO Q 4 TO 6 H PRN  .  promethazine (PHENERGAN) 25 MG suppository Place 1 suppository (25 mg total) rectally every 6 (six) hours as needed for nausea or vomiting.  . senna-docusate (SENOKOT-S) 8.6-50 MG tablet Take 1 tablet by mouth at bedtime as needed for mild constipation.  . sodium chloride 1 g tablet Take 1 tablet (1 g total) by mouth 2 (two) times daily with a meal.  . STIOLTO RESPIMAT 2.5-2.5 MCG/ACT AERS INL 2 PFS PO QD  . sucralfate (CARAFATE) 1 g tablet Take 1 tablet (1 g total) by mouth 3 (three) times daily.  Marland Kitchen thiamine 100 MG tablet Take 1 tablet (100 mg total) by mouth daily.  . vitamin C (VITAMIN C) 500 MG tablet Take 1 tablet (500 mg total) by mouth 2 (two) times daily.  . Vitamin D, Ergocalciferol, (DRISDOL) 50000 units CAPS capsule TK ONE C PO Q WEEK    Allergies:   Iodinated diagnostic agents; Tylenol [acetaminophen]; Amitriptyline; Aspirin; Nsaids; Ibuprofen; and Tape   Social History:  The patient  reports  that he has been smoking cigarettes. He has been smoking about 0.25 packs per day. He has never used smokeless tobacco. He reports that he drinks alcohol. He reports that he does not use drugs.   Family History:  The patient's family history includes Breast cancer in his sister; Colon cancer in his paternal grandfather; Heart attack in his brother, brother, and sister; Heart attack (age of onset: 39) in his mother; Hyperlipidemia in his mother; Hypertension in his father; Lung cancer in his father.  ROS:   Review of Systems  Constitutional: Positive for malaise/fatigue and weight loss. Negative for chills, diaphoresis and fever.  HENT: Negative for congestion.   Eyes: Negative for discharge and redness.  Respiratory: Negative for cough, hemoptysis, sputum production, shortness of breath and wheezing.   Cardiovascular: Negative for chest pain, palpitations, orthopnea, claudication, leg swelling and PND.       Leg pain, not consistent with claudication (just with standing)  Gastrointestinal: Positive for nausea. Negative for abdominal pain, blood in stool, heartburn, melena and vomiting.  Genitourinary: Negative for hematuria.  Musculoskeletal: Positive for back pain, falls, joint pain and myalgias.  Skin: Negative for rash.  Neurological: Positive for dizziness, tingling, sensory change, focal weakness and weakness. Negative for tremors, speech change, seizures and loss of consciousness.  Endo/Heme/Allergies: Does not bruise/bleed easily.  Psychiatric/Behavioral: Negative for substance abuse. The patient is not nervous/anxious.      PHYSICAL EXAM:  VS:  BP 104/64 (BP Location: Left Arm, Patient Position: Sitting, Cuff Size: Normal)   Pulse 89   Ht 5\' 11"  (1.803 m)   Wt 114 lb 7 oz (51.9 kg) Comment: checked at the cancer center on 12/02/2017  BMI 15.96 kg/m  BMI: Body mass index is 15.96 kg/m.  Physical Exam  Constitutional: He is oriented to person, place, and time. He appears  cachectic. He is cooperative. He has a sickly appearance. He appears ill. No distress.  Chronically ill and frail appearing  HENT:  Head: Normocephalic and atraumatic.  Eyes: Right eye exhibits no discharge. Left eye exhibits no discharge.  Neck: Normal range of motion. No JVD present.  Cardiovascular: Normal rate, regular rhythm, S1 normal, S2 normal and normal heart sounds. Exam reveals no distant heart sounds, no friction rub, no midsystolic click and no opening snap.  No murmur heard. Pulses:      Popliteal pulses are 1+ on the right side, and 1+ on the left side.       Dorsalis pedis pulses  are 1+ on the right side, and 0 on the left side.       Posterior tibial pulses are 1+ on the right side, and 0 on the left side.  Pulmonary/Chest: Effort normal and breath sounds normal. No respiratory distress. He has no decreased breath sounds. He has no wheezes. He has no rales. He exhibits no tenderness.  Abdominal: Soft. He exhibits no distension. There is no tenderness.  Musculoskeletal: He exhibits no edema.       Right shoulder: He exhibits decreased strength.  -Diffuse atrophy noted throughout  -Status post left 5th toe amputation  -Capillary refill is < 2 seconds in all digits  Neurological: He is alert and oriented to person, place, and time.  Skin: Skin is warm and dry. No cyanosis. Nails show no clubbing.  Psychiatric: He has a normal mood and affect. His speech is normal and behavior is normal. Judgment and thought content normal.     EKG:  Was ordered and interpreted by me today. Shows NSR, 89 bpm, no acute st/t changes   Recent Labs: 08/28/2017: TSH 0.473 12/02/2017: ALT 23; BUN 5; Creatinine, Ser 0.65; Hemoglobin 10.8; Magnesium 1.7; Platelets 182; Potassium 3.6; Sodium 132  No results found for requested labs within last 8760 hours.   Estimated Creatinine Clearance: 74.8 mL/min (by C-G formula based on SCr of 0.65 mg/dL).   Wt Readings from Last 3 Encounters:  12/09/17 114  lb 7 oz (51.9 kg)  12/02/17 113 lb 11.2 oz (51.6 kg)  11/27/17 112 lb (50.8 kg)     Other studies reviewed: Additional studies/records reviewed today include: summarized above  ASSESSMENT AND PLAN:  1. Bilateral leg weakness/pain: This is almost certainly a sequale of his underlying malignancy. He is severely atrophied and is demonstrating a picture consistent with failure to thrive with a recent albumin of 2.5. He had recent lower extremity studies in 01/2017 that demonstrated normal right ABIs and a patent left popliteal stent with significant left SFA stenosis. This was discussed with Dr. Fletcher Anon and it is not felt the patient's underlying PAD is consistent with his current presentation. He is not felt to be a good candidate for angiogram given his frail state and comorbid conditions. Recommend his pain be managed by oncology. Check thsh, cbc, cmet, b12, folate.   2. PAD: As above. No plans for invasive workup at this time. Continue current medical therapy.   3. CAD involving the native coronary arteries: No symptoms concerning for angina. Continue current medical therapy. No plans for ischemic evaluation at this time.   4. Metastatic cancer: His overall prognosis appears to be very poor. Followed by oncology. Consider Palliative care consult if not already obtained.   Disposition: F/u with Dr. Fletcher Anon or an APP in 3 months, sooner if needed.   Current medicines are reviewed at length with the patient today.  The patient did not have any concerns regarding medicines.  Signed, Christell Faith, PA-C 12/09/2017 2:14 PM     [address] [place], [place] [address] [phone]

## 2017-12-09 ENCOUNTER — Ambulatory Visit: Payer: Medicaid Other | Admitting: Radiation Oncology

## 2017-12-09 ENCOUNTER — Encounter: Payer: Self-pay | Admitting: Physician Assistant

## 2017-12-09 ENCOUNTER — Ambulatory Visit (INDEPENDENT_AMBULATORY_CARE_PROVIDER_SITE_OTHER): Payer: Medicaid Other | Admitting: Physician Assistant

## 2017-12-09 VITALS — BP 104/64 | HR 89 | Ht 71.0 in | Wt 114.4 lb

## 2017-12-09 DIAGNOSIS — I739 Peripheral vascular disease, unspecified: Secondary | ICD-10-CM

## 2017-12-09 DIAGNOSIS — I2510 Atherosclerotic heart disease of native coronary artery without angina pectoris: Secondary | ICD-10-CM

## 2017-12-09 DIAGNOSIS — R627 Adult failure to thrive: Secondary | ICD-10-CM

## 2017-12-09 DIAGNOSIS — R202 Paresthesia of skin: Secondary | ICD-10-CM | POA: Diagnosis not present

## 2017-12-09 DIAGNOSIS — C3490 Malignant neoplasm of unspecified part of unspecified bronchus or lung: Secondary | ICD-10-CM

## 2017-12-09 NOTE — Patient Instructions (Signed)
Medication Instructions: - Your physician recommends that you continue on your current medications as directed. Please refer to the Current Medication list given to you today.  Labwork: - Your physician recommends that you have lab work today: CMET/ TSH/ CBC/ B12/ folate  Procedures/Testing: - none ordered  Follow-Up: - pending Ryan's discussion with Dr. Fletcher Anon.  Any Additional Special Instructions Will Be Listed Below (If Applicable).     If you need a refill on your cardiac medications before your next appointment, please call your pharmacy.

## 2017-12-10 ENCOUNTER — Telehealth: Payer: Self-pay | Admitting: Urgent Care

## 2017-12-10 ENCOUNTER — Ambulatory Visit
Admission: RE | Admit: 2017-12-10 | Discharge: 2017-12-10 | Disposition: A | Discharge status: Home / Self Care | Payer: Medicaid Other | Source: Ambulatory Visit | Attending: Hematology and Oncology | Admitting: Hematology and Oncology

## 2017-12-10 DIAGNOSIS — J439 Emphysema, unspecified: Secondary | ICD-10-CM | POA: Insufficient documentation

## 2017-12-10 DIAGNOSIS — I700 Atherosclerosis of aorta: Secondary | ICD-10-CM | POA: Diagnosis not present

## 2017-12-10 DIAGNOSIS — C3490 Malignant neoplasm of unspecified part of unspecified bronchus or lung: Secondary | ICD-10-CM | POA: Diagnosis not present

## 2017-12-10 LAB — COMPREHENSIVE METABOLIC PANEL
ALT: 9 IU/L (ref 0–44)
AST: 11 IU/L (ref 0–40)
Albumin/Globulin Ratio: 1.2 (ref 1.2–2.2)
Albumin: 2.6 g/dL — ABNORMAL LOW (ref 3.5–5.5)
Alkaline Phosphatase: 89 IU/L (ref 39–117)
BUN/Creatinine Ratio: 9 (ref 9–20)
BUN: 6 mg/dL (ref 6–24)
Bilirubin Total: 0.6 mg/dL (ref 0.0–1.2)
CALCIUM: 8.3 mg/dL — AB (ref 8.7–10.2)
CHLORIDE: 103 mmol/L (ref 96–106)
CO2: 20 mmol/L (ref 20–29)
Creatinine, Ser: 0.69 mg/dL — ABNORMAL LOW (ref 0.76–1.27)
GFR, EST AFRICAN AMERICAN: 122 mL/min/{1.73_m2} (ref >59)
GFR, EST NON AFRICAN AMERICAN: 105 mL/min/{1.73_m2} (ref >59)
GLUCOSE: 78 mg/dL (ref 65–99)
Globulin, Total: 2.1 g/dL (ref 1.5–4.5)
Potassium: 4.4 mmol/L (ref 3.5–5.2)
Sodium: 138 mmol/L (ref 134–144)
TOTAL PROTEIN: 4.7 g/dL — AB (ref 6.0–8.5)

## 2017-12-10 LAB — CBC WITH DIFFERENTIAL/PLATELET
Basophils Absolute: 0 10*3/uL (ref 0.0–0.2)
Basos: 1 %
EOS (ABSOLUTE): 0 10*3/uL (ref 0.0–0.4)
EOS: 1 %
Hematocrit: 31.5 % — ABNORMAL LOW (ref 37.5–51.0)
Hemoglobin: 10.9 g/dL — ABNORMAL LOW (ref 13.0–17.7)
IMMATURE GRANS (ABS): 0 10*3/uL (ref 0.0–0.1)
IMMATURE GRANULOCYTES: 0 %
LYMPHS: 15 %
Lymphocytes Absolute: 0.8 10*3/uL (ref 0.7–3.1)
MCH: 34.2 pg — ABNORMAL HIGH (ref 26.6–33.0)
MCHC: 34.6 g/dL (ref 31.5–35.7)
MCV: 99 fL — ABNORMAL HIGH (ref 79–97)
MONOS ABS: 0.4 10*3/uL (ref 0.1–0.9)
Monocytes: 8 %
NEUTROS PCT: 75 %
Neutrophils Absolute: 4.1 10*3/uL (ref 1.4–7.0)
PLATELETS: 189 10*3/uL (ref 150–450)
RBC: 3.19 x10E6/uL — AB (ref 4.14–5.80)
RDW: 14.2 % (ref 12.3–15.4)
WBC: 5.5 10*3/uL (ref 3.4–10.8)

## 2017-12-10 LAB — B12 AND FOLATE PANEL
FOLATE: 17.6 ng/mL (ref >3.0)
Vitamin B-12: 283 pg/mL (ref 232–1245)

## 2017-12-10 LAB — TSH: TSH: 0.596 u[IU]/mL (ref 0.450–4.500)

## 2017-12-10 MED ORDER — LEVOFLOXACIN 750 MG PO TABS: 750.0000 mg | ORAL_TABLET | Freq: Every day | ORAL | 0 refills | Status: AC

## 2017-12-10 NOTE — Telephone Encounter (Addendum)
Re:  Results of CT (pneumonia)  Patient called to discuss results from most recent chest CT done earlier today. Spoke with patient who denied any fevers or increased shortness of breath. He does have a persistent cough with green/yellow sputum production. Patient notes that he has "not been feeling all that well" as of late.   CT results reviewed as follows: 1. 4.3 x 3.8 cm area of peribronchovascular consolidation and groundglass opacity in the RUL. Radiologist commented that the rapidity of onset since imaging done on 10/18/2017 is most compatible with pneumonia.   2. No new pulmonary nodules or thoracic adenopathy. 3. 0.6 cm solid pulmonary nodule to the medial apical LUL was noted to be stable to minimally decreased in size since 10/18/2017 PET. 4. Stable faint patchy sclerotic lesions in the BILATERAL posterior ribs, with no new osseous lesions noted.   Plan: 1. Pneumonia - acute Patient with acute pneumonia on CT imaging performed earlier today. Of note his has been treated for several infections in the past 30 days. He was treated for sinusitis starting on 11/22/2017 with a 10-day course of Augmentin, and for EPEC (+) stools on 11/28/2017 with a 3 day course of azithromycin 500 mg daily.   Respiratory pathogen unknown. Given recent beta-lactam and macrolide use, patient is at increased risk for antibiotic resistance. Additional risk factors include alcoholism, immunosuppressive illness, and various medical comorbidities.   Allergies reviewed - no documented allergies to ABX. Renal function reviewed: CrCl 74.8 mL/min on 12/09/2017, therefore no dose reduction required. Will send in Rx for levofloxacin 750 mg PO daily x 5 days.   Patient advised that Rx would be called in today. He should rest, increase his fluid intake, and monitor his temperature. Patient was encouraged to call the clinic with worsening symptoms, if he develops a fever, or for any other concerns that arise prior to his next  scheduled RTC visit.   Honor Loh, MSN, APRN, FNP-C, CEN Oncology/Hematology Nurse Practitioner  [facility] 12/10/17, 6:04 PM

## 2017-12-11 ENCOUNTER — Other Ambulatory Visit: Payer: Self-pay

## 2017-12-11 ENCOUNTER — Ambulatory Visit
Payer: Medicaid Other | Attending: Student in an Organized Health Care Education/Training Program | Admitting: Student in an Organized Health Care Education/Training Program

## 2017-12-11 ENCOUNTER — Encounter: Payer: Self-pay | Admitting: Student in an Organized Health Care Education/Training Program

## 2017-12-11 VITALS — BP 116/89 | HR 89 | Temp 98.0°F | Ht 71.0 in | Wt 114.7 lb

## 2017-12-11 DIAGNOSIS — Z951 Presence of aortocoronary bypass graft: Secondary | ICD-10-CM | POA: Insufficient documentation

## 2017-12-11 DIAGNOSIS — I10 Essential (primary) hypertension: Secondary | ICD-10-CM | POA: Insufficient documentation

## 2017-12-11 DIAGNOSIS — I252 Old myocardial infarction: Secondary | ICD-10-CM

## 2017-12-11 DIAGNOSIS — M546 Pain in thoracic spine: Secondary | ICD-10-CM

## 2017-12-11 DIAGNOSIS — M79671 Pain in right foot: Secondary | ICD-10-CM | POA: Insufficient documentation

## 2017-12-11 DIAGNOSIS — Z5181 Encounter for therapeutic drug level monitoring: Secondary | ICD-10-CM | POA: Diagnosis not present

## 2017-12-11 DIAGNOSIS — C7951 Secondary malignant neoplasm of bone: Secondary | ICD-10-CM | POA: Insufficient documentation

## 2017-12-11 DIAGNOSIS — C3490 Malignant neoplasm of unspecified part of unspecified bronchus or lung: Secondary | ICD-10-CM

## 2017-12-11 DIAGNOSIS — M14671 Charcot's joint, right ankle and foot: Secondary | ICD-10-CM | POA: Diagnosis not present

## 2017-12-11 DIAGNOSIS — M792 Neuralgia and neuritis, unspecified: Secondary | ICD-10-CM | POA: Diagnosis not present

## 2017-12-11 DIAGNOSIS — Z79899 Other long term (current) drug therapy: Secondary | ICD-10-CM | POA: Insufficient documentation

## 2017-12-11 DIAGNOSIS — Z760 Encounter for issue of repeat prescription: Secondary | ICD-10-CM | POA: Insufficient documentation

## 2017-12-11 DIAGNOSIS — G629 Polyneuropathy, unspecified: Secondary | ICD-10-CM | POA: Insufficient documentation

## 2017-12-11 DIAGNOSIS — F17210 Nicotine dependence, cigarettes, uncomplicated: Secondary | ICD-10-CM | POA: Diagnosis not present

## 2017-12-11 DIAGNOSIS — E785 Hyperlipidemia, unspecified: Secondary | ICD-10-CM | POA: Insufficient documentation

## 2017-12-11 DIAGNOSIS — Z9221 Personal history of antineoplastic chemotherapy: Secondary | ICD-10-CM | POA: Diagnosis not present

## 2017-12-11 DIAGNOSIS — I2510 Atherosclerotic heart disease of native coronary artery without angina pectoris: Secondary | ICD-10-CM | POA: Diagnosis not present

## 2017-12-11 DIAGNOSIS — G893 Neoplasm related pain (acute) (chronic): Secondary | ICD-10-CM | POA: Insufficient documentation

## 2017-12-11 DIAGNOSIS — M79672 Pain in left foot: Secondary | ICD-10-CM | POA: Diagnosis not present

## 2017-12-11 DIAGNOSIS — C3412 Malignant neoplasm of upper lobe, left bronchus or lung: Secondary | ICD-10-CM | POA: Insufficient documentation

## 2017-12-11 DIAGNOSIS — E7800 Pure hypercholesterolemia, unspecified: Secondary | ICD-10-CM | POA: Insufficient documentation

## 2017-12-11 DIAGNOSIS — D509 Iron deficiency anemia, unspecified: Secondary | ICD-10-CM | POA: Insufficient documentation

## 2017-12-11 DIAGNOSIS — G8929 Other chronic pain: Secondary | ICD-10-CM

## 2017-12-11 DIAGNOSIS — G894 Chronic pain syndrome: Secondary | ICD-10-CM | POA: Diagnosis present

## 2017-12-11 DIAGNOSIS — C7931 Secondary malignant neoplasm of brain: Secondary | ICD-10-CM | POA: Diagnosis not present

## 2017-12-11 DIAGNOSIS — K219 Gastro-esophageal reflux disease without esophagitis: Secondary | ICD-10-CM | POA: Diagnosis not present

## 2017-12-11 MED ORDER — OXYCODONE HCL 5 MG PO TABS: 5.0000 mg | ORAL_TABLET | Freq: Three times a day (TID) | ORAL | 0 refills | Status: DC | PRN

## 2017-12-11 MED ORDER — TIZANIDINE HCL 4 MG PO TABS: 4.0000 mg | ORAL_TABLET | Freq: Two times a day (BID) | ORAL | 3 refills | Status: DC | PRN

## 2017-12-11 NOTE — Progress Notes (Deleted)
Patient's Name: Philip [name]  MRN: [id number]  Referring Provider: Lorelee Market, MD  DOB: [birth year]  PCP: Lorelee Market, MD  DOS: 12/11/2017  Note by: Gillis Santa, MD  Service setting: Ambulatory outpatient  Specialty: Interventional Pain Management  Location: [facility] (AMB) Pain Management Facility    Patient type: Established   Primary Reason(s) for Visit: Encounter for prescription drug management. (Level of risk: moderate)  CC: Medication Refill  HPI  Philip [name] is a 57 y.o. year old, male patient, who comes today for a medication management evaluation. He has Bilateral foot pain; Tobacco use disorder; Convulsions ([place]); Preventative health care; Right ankle pain; History of MI (myocardial infarction); CAD (coronary artery disease), native coronary artery; S/P CABG x 3; Acute myocardial infarction of inferior wall ([place]); Acute sinusitis, unspecified; Hypertension; Pain in joint involving ankle and foot; Pain in soft tissues of limb; Status post aorto-coronary artery bypass graft; Pure hypercholesterolemia; Rash and other nonspecific skin eruption; PAD (peripheral artery disease) ([place]); Hyperlipidemia, unspecified; Peripheral neuropathy; Iron deficiency anemia; Lymphadenopathy, mediastinal; Weight loss; Night sweats; Small cell lung cancer ([place]); Bone metastasis ([place]); Postobstructive pneumonia; Goals of care, counseling/discussion; Acute CVA (cerebrovascular accident) ([place]); Cancer of upper lobe of left lung ([place]); Cancer related pain; Cough; Routine history and physical examination of adult; Hyponatremia; Hypocalcemia; Hypomagnesemia; Charcot's joint of foot (Right); Chronic foot pain (Left); Chronic pain syndrome; Dry gangrene ([place]); GERD (gastroesophageal reflux disease); Myocardial infarction ([place]); Neuropathic pain; Osteopenia of both feet; Encounter for antineoplastic chemotherapy; Chronic thoracic spine pain; Antineoplastic chemotherapy induced anemia; Abscess of left hand;  Non-intractable vomiting with nausea; Hypokalemia; Dehydration; Skin abscess; Lung cancer metastatic to bone ([place]); Neoplasm of thoracic spine; Marijuana user; Abnormal drug screen; Weakness; Intractable nausea and vomiting; Protein-calorie malnutrition, severe; Adrenal insufficiency ([place]); Folate deficiency; Metastasis to brain [facility]); White matter disease; Enteropathogenic Escherichia coli infection; and Left upper lobe pulmonary nodule on their problem list. His primarily concern today is the Medication Refill  Pain Assessment: Location: Right, Left Leg(bilateral) Radiating: From hips bilateral, down to knee, and feel all bilateral Onset: More than a month ago Duration: Chronic pain Quality: Aching, Constant, Sharp Severity: 8 /10 (subjective, self-reported pain score)  Note: Reported level is compatible with observation.                         When using our objective Pain Scale, levels between 6 and 10/10 are said to belong in an emergency room, as it progressively worsens from a 6/10, described as severely limiting, requiring emergency care not usually available at an outpatient pain management facility. At a 6/10 level, communication becomes difficult and requires great effort. Assistance to reach the emergency department may be required. Facial flushing and profuse sweating along with potentially dangerous increases in heart rate and blood pressure will be evident. Effect on ADL: "Unable to get up"  Timing: Constant Modifying factors: Denies  BP: 116/89  HR: 89  Philip [name] was last scheduled for an appointment on 11/27/2017 for medication management. During today's appointment we reviewed Philip [name] chronic pain status, as well as his outpatient medication regimen.  The patient  reports that he does not use drugs. His body mass index is 16 kg/m.  Further details on both, my assessment(s), as well as the proposed treatment plan, please see below.  Controlled Substance Pharmacotherapy  Assessment REMS (Risk Evaluation and Mitigation Strategy)  Analgesic: *** MME/day: *** mg/day.  Janne Napoleon, RN  12/11/2017 11:47 AM  Sign at close  encounter Safety precautions to be maintained throughout the outpatient stay will include: orient to surroundings, keep bed in low position, maintain call bell within reach at all times, provide assistance with transfer out of bed and ambulation.  Pharmacokinetics: Liberation and absorption (onset of action): WNL Distribution (time to peak effect): WNL Metabolism and excretion (duration of action): WNL         Pharmacodynamics: Desired effects: Analgesia: Philip [name] reports >50% benefit. Functional ability: Patient reports that medication allows him to accomplish basic ADLs Clinically meaningful improvement in function (CMIF): Sustained CMIF goals met Perceived effectiveness: Described as relatively effective, allowing for increase in activities of daily living (ADL) Undesirable effects: Side-effects or Adverse reactions: None reported Monitoring: [place] PMP: Online review of the past 85-monthperiod conducted. Compliant with practice rules and regulations Last UDS on record: Summary  Date Value Ref Range Status  11/27/2017 FINAL  Final    Comment:    ==================================================================== TOXASSURE COMP DRUG ANALYSIS,UR ==================================================================== Test                             Result       Flag       Units Drug Present and Declared for Prescription Verification   Pregabalin                     PRESENT      EXPECTED   Diphenhydramine                PRESENT      EXPECTED   Metoprolol                     PRESENT      EXPECTED Drug Present not Declared for Prescription Verification   Carboxy-THC                    90           UNEXPECTED ng/mg creat    Carboxy-THC is a metabolite of tetrahydrocannabinol  (THC).    [facility]is most commonly illicit, but THC is  also present    in a scheduled prescription medication.   Tizanidine                     PRESENT      UNEXPECTED Drug Absent but Declared for Prescription Verification   Methocarbamol                  Not Detected UNEXPECTED   Promethazine                   Not Detected UNEXPECTED   Lidocaine                      Not Detected UNEXPECTED    Lidocaine, as indicated in the declared medication list, is not    always detected even when used as directed. ==================================================================== Test                      Result    Flag   Units      Ref Range   Creatinine              194              mg/dL      >=20 ==================================================================== Declared Medications:  The flagging and interpretation on this  report are based on the  following declared medications.  Unexpected results may arise from  inaccuracies in the declared medications.  **Note: The testing scope of this panel includes these medications:  Diphenhydramine (Benadryl)  Methocarbamol (Robaxin)  Metoprolol (Lopressor)  Pregabalin (Lyrica)  Promethazine (Phenergan)  **Note: The testing scope of this panel does not include small to  moderate amounts of these reported medications:  Lidocaine (Lidocaine-Prilocaine)  **Note: The testing scope of this panel does not include following  reported medications:  Albuterol (ProAir HFA)  Amoxicillin (Augmentin)  Atorvastatin (Lipitor)  Azithromycin (Zithromax)  Clavulinate (Augmentin)  Clopidogrel (Plavix)  Dexamethasone (Decadron)  Docusate (Senokot-S)  Famotidine (Pepcid)  Fluticasone (Flovent)  Folic acid (Folvite)  Hydrochlorothiazide (Hydrodiuril)  Magnesium (Mag-Ox)  Metoclopramide (Reglan)  Multivitamin  Olodaterol (Stiolto)  Ondansetron (Zofran)  Pantoprazole (Protonix)  Potassium  Prilocaine (Lidocaine-Prilocaine)  Sennosides (Senokot-S)  Sucralfate (Carafate)  Supplement  Tiotropium (Stiolto)   Vitamin B1 (Thiamine)  Vitamin C  Vitamin D2 (Drisdol) ==================================================================== For clinical consultation, please call [phone]. ====================================================================    UDS interpretation: Compliant          Medication Assessment Form: Reviewed. Patient indicates being compliant with therapy Treatment compliance: Compliant Risk Assessment Profile: Aberrant behavior: See prior evaluations. None observed or detected today Comorbid factors increasing risk of overdose: See prior notes. No additional risks detected today Opioid risk tool (ORT) (Total Score): 5 Personal History of Substance Abuse (SUD-Substance use disorder):  Alcohol: Negative  Illegal Drugs: Positive Male or Male  Rx Drugs: Negative  ORT Risk Level calculation: Moderate Risk Risk of substance use disorder (SUD): Low Opioid Risk Tool - 12/11/17 1146      Family History of Substance Abuse   Alcohol  Positive Male    Illegal Drugs  Negative    Rx Drugs  Negative      Personal History of Substance Abuse   Alcohol  Negative    Illegal Drugs  Positive Male or Male    Rx Drugs  Negative      Age   Age between 28-45 years   No      History of Preadolescent Sexual Abuse   History of Preadolescent Sexual Abuse  Negative or Male      Psychological Disease   Psychological Disease  Negative    Depression  Negative      Total Score   Opioid Risk Tool Scoring  5    Opioid Risk Interpretation  Moderate Risk      ORT Scoring interpretation table:  Score <3 = Low Risk for SUD  Score between 4-7 = Moderate Risk for SUD  Score >8 = High Risk for Opioid Abuse   Risk Mitigation Strategies:  Patient Counseling: Covered Patient-Prescriber Agreement (PPA): Present and active  Notification to other healthcare providers: Done  Pharmacologic Plan: No change in therapy, at this time.             Laboratory Chemistry  Inflammation  Markers (CRP: Acute Phase) (ESR: Chronic Phase) Lab Results  Component Value Date   CRP 1.3 (H) 11/15/2016   ESRSEDRATE 92 (H) 11/15/2016   LATICACIDVEN 0.6 08/26/2017                         Rheumatology Markers Lab Results  Component Value Date   LABURIC 4.2 (L) 06/28/2016                        Renal Function Markers  Lab Results  Component Value Date   BUN 6 12/09/2017   CREATININE 0.69 (L) 12/09/2017   BCR 9 12/09/2017   GFRAA 122 12/09/2017   GFRNONAA 105 12/09/2017                             Hepatic Function Markers Lab Results  Component Value Date   AST 11 12/09/2017   ALT 9 12/09/2017   ALBUMIN 2.6 (L) 12/09/2017   ALKPHOS 89 12/09/2017   LIPASE 37 08/26/2017                        Electrolytes Lab Results  Component Value Date   NA 138 12/09/2017   K 4.4 12/09/2017   CL 103 12/09/2017   CALCIUM 8.3 (L) 12/09/2017   MG 1.7 12/02/2017   PHOS 4.6 10/03/2017                        Neuropathy Markers Lab Results  Component Value Date   VITAMINB12 283 12/09/2017   FOLATE 17.6 12/09/2017   HGBA1C 5.1 07/27/2016   HIV Non Reactive 08/27/2017                        Bone Pathology Markers No results found for: VD25OH, [id number], [id number], [id number], 25OHVITD1, 25OHVITD2, 25OHVITD3, TESTOFREE, TESTOSTERONE                       Coagulation Parameters Lab Results  Component Value Date   INR 0.89 08/28/2017   LABPROT 12.0 08/28/2017   APTT 34 07/26/2016   PLT 189 12/09/2017   DDIMER 0.29 07/02/2014                        Cardiovascular Markers Lab Results  Component Value Date   CKTOTAL 48 06/09/2015   TROPONINI <0.03 08/26/2017   HGB 10.9 (L) 12/09/2017   HCT 31.5 (L) 12/09/2017                         CA Markers Lab Results  Component Value Date   CEA 1.7 07/31/2016                        Note: Lab results reviewed.  Recent Diagnostic Imaging Results  CT Chest Wo Contrast CLINICAL DATA:  Small cell left lung cancer, extensive  stage, presenting for restaging.  EXAM: CT CHEST WITHOUT CONTRAST  TECHNIQUE: Multidetector CT imaging of the chest was performed following the standard protocol without IV contrast.  COMPARISON:  10/18/2017 PET-CT.  03/18/2017 chest CT.  FINDINGS: Cardiovascular: Normal heart size. No significant pericardial effusion/thickening. Left main and 3 vessel coronary atherosclerosis status post CABG. Atherosclerotic nonaneurysmal thoracic aorta. Normal caliber pulmonary arteries. Right internal jugular MediPort terminates at the cavoatrial junction.  Mediastinum/Nodes: No discrete thyroid nodules. Unremarkable esophagus. No axillary adenopathy. Stable minimal soft tissue in the left AP window and left suprahilar region without discrete mediastinal or hilar adenopathy on this noncontrast scan.  Lungs/Pleura: No pneumothorax. No pleural effusion. Moderate centrilobular and paraseptal emphysema with mild diffuse bronchial wall thickening. Medial apical left upper lobe 0.6 cm solid pulmonary nodule (series 3/image 27), previously 0.7 cm on 10/18/2017 PET-CT using similar measurement technique. Mild sharply marginated paramediastinal radiation fibrosis in the right greater than left lungs  is stable. Anterior left upper lobe 0.3 cm solid pulmonary nodule (series 3/image 49) and left lower lobe 0.3 cm solid pulmonary nodule (series 3/image 109) are stable since 06/28/2016 chest CT and considered benign. There is a new patchy focus of peribronchovascular consolidation and ground-glass opacity in the right upper lobe measuring up to 4.3 x 3.8 cm (series 3/image 52). No additional significant pulmonary nodules.  Upper abdomen: Cholelithiasis.  Musculoskeletal: Faint patchy sclerotic lesions in the posterior ribs bilaterally are all unchanged. No new focal osseous lesions. Intact sternotomy wires. Moderate thoracic spondylosis.  IMPRESSION: 1. New patchy focus of peribronchovascular  consolidation and ground-glass opacity in the right upper lobe. The rapid onset since 10/18/2017 is most compatible with pneumonia. Attention on short-term follow-up chest CT in 3 months advised. 2. Medial apical left upper lobe 0.6 cm solid pulmonary nodule, stable to minimally decreased since 10/18/2017 PET-CT. Continued attention on short-term follow-up chest CT advised. 3. No new pulmonary nodules.  No thoracic adenopathy. 4. Stable faint patchy sclerotic lesions in the bilateral posterior ribs. No new osseous lesions.  Aortic Atherosclerosis (ICD10-I70.0) and Emphysema (ICD10-J43.9).  Electronically Signed   By: Ilona Sorrel M.D.   On: 12/10/2017 11:50  Complexity Note: Imaging results reviewed. Results shared with Philip [name], using Layman's terms.                         Meds   Current Outpatient Medications:  .  atorvastatin (LIPITOR) 40 MG tablet, TAKE 1 TABLET BY MOUTH DAILY, Disp: 90 tablet, Rfl: 3 .  Calcium Carb-Cholecalciferol (CALCIUM + D3 PO), Take 1 tablet by mouth 3 (three) times daily., Disp: , Rfl:  .  clopidogrel (PLAVIX) 75 MG tablet, Take 1 tablet (75 mg total) by mouth daily., Disp: 90 tablet, Rfl: 3 .  diphenhydrAMINE (BENADRYL) 25 mg capsule, Take 25 mg by mouth 2 (two) times daily as needed for allergies. , Disp: , Rfl:  .  famotidine (PEPCID) 20 MG tablet, Take 1 tablet (20 mg total) by mouth 2 (two) times daily., Disp: 60 tablet, Rfl: 0 .  feeding supplement, ENSURE ENLIVE, (ENSURE ENLIVE) LIQD, Take 237 mLs by mouth 3 (three) times daily between meals., Disp: 237 mL, Rfl: 12 .  FLOVENT HFA 220 MCG/ACT inhaler, INL 2 PFS PO BID, Disp: , Rfl: 5 .  folic acid (FOLVITE) 1 MG tablet, Take 1 tablet (1 mg total) by mouth daily., Disp: 30 tablet, Rfl: 0 .  hydrochlorothiazide (HYDRODIURIL) 12.5 MG tablet, Take 1 tablet by mouth daily., Disp: , Rfl: 2 .  levofloxacin (LEVAQUIN) 750 MG tablet, Take 1 tablet (750 mg total) by mouth daily for 5 days., Disp: 5 tablet,  Rfl: 0 .  lidocaine-prilocaine (EMLA) cream, Apply to affected area once, Disp: 30 g, Rfl: 3 .  magnesium oxide (MAG-OX) 400 (241.3 Mg) MG tablet, Take 1 tablet (400 mg total) by mouth 3 (three) times daily., Disp: 90 tablet, Rfl: 3 .  methocarbamol (ROBAXIN) 500 MG tablet, Take 1 tablet (500 mg total) by mouth every 8 (eight) hours as needed for muscle spasms., Disp: 90 tablet, Rfl: 1 .  metoCLOPramide (REGLAN) 5 MG tablet, Take 1 tablet (5 mg total) by mouth 4 (four) times daily -  before meals and at bedtime., Disp: 100 tablet, Rfl: 0 .  metoprolol tartrate (LOPRESSOR) 25 MG tablet, TAKE 1 TABLET BY MOUTH TWICE DAILY, Disp: 180 tablet, Rfl: 3 .  Multiple Vitamin (MULTIVITAMIN WITH MINERALS) TABS tablet, Take 1 tablet by  mouth daily., Disp: 30 tablet, Rfl: 1 .  ondansetron (ZOFRAN-ODT) 8 MG disintegrating tablet, Take 1 tablet (8 mg total) by mouth every 8 (eight) hours as needed for nausea or vomiting., Disp: 30 tablet, Rfl: 1 .  pantoprazole (PROTONIX) 40 MG tablet, TK 1 T PO QD, Disp: , Rfl: 5 .  potassium phosphate, monobasic, (K-PHOS ORIGINAL) 500 MG tablet, Take 1 tablet (500 mg total) by mouth 4 (four) times daily -  with meals and at bedtime., Disp: 10 tablet, Rfl: 0 .  pregabalin (LYRICA) 50 MG capsule, 50 mg qhs x 1 week, then 50 mg BID week 2, then 50 mg TID thereafter, Disp: 90 capsule, Rfl: 1 .  PROAIR HFA 108 (90 Base) MCG/ACT inhaler, INL 2 PFS PO Q 4 TO 6 H PRN, Disp: , Rfl: 5 .  promethazine (PHENERGAN) 25 MG suppository, Place 1 suppository (25 mg total) rectally every 6 (six) hours as needed for nausea or vomiting., Disp: 12 each, Rfl: 5 .  senna-docusate (SENOKOT-S) 8.6-50 MG tablet, Take 1 tablet by mouth at bedtime as needed for mild constipation., Disp: 30 tablet, Rfl: 0 .  sodium chloride 1 g tablet, Take 1 tablet (1 g total) by mouth 2 (two) times daily with a meal., Disp: 60 tablet, Rfl: 0 .  STIOLTO RESPIMAT 2.5-2.5 MCG/ACT AERS, INL 2 PFS PO QD, Disp: , Rfl: 5 .   sucralfate (CARAFATE) 1 g tablet, Take 1 tablet (1 g total) by mouth 3 (three) times daily., Disp: 90 tablet, Rfl: 1 .  thiamine 100 MG tablet, Take 1 tablet (100 mg total) by mouth daily., Disp: 30 tablet, Rfl: 1 .  vitamin C (VITAMIN C) 500 MG tablet, Take 1 tablet (500 mg total) by mouth 2 (two) times daily., Disp: 60 tablet, Rfl: 1 .  Vitamin D, Ergocalciferol, (DRISDOL) 50000 units CAPS capsule, TK ONE C PO Q WEEK, Disp: , Rfl: 5 No current facility-administered medications for this visit.   Facility-Administered Medications Ordered in Other Visits:  .  heparin lock flush 100 unit/mL, 500 Units, Intravenous, Once, Corcoran, Melissa C, MD .  heparin lock flush 100 unit/mL, 500 Units, Intravenous, Once, Corcoran, Melissa C, MD .  sodium chloride 0.9 % 1,000 mL with potassium chloride 10 mEq, magnesium sulfate 4 g infusion, , Intravenous, Continuous, Burns, Wandra Feinstein, NP, Stopped at 11/22/17 1201 .  sodium chloride flush (NS) 0.9 % injection 10 mL, 10 mL, Intravenous, PRN, Mike Gip, Melissa C, MD, 10 mL at 09/10/17 0847 .  sodium chloride flush (NS) 0.9 % injection 10 mL, 10 mL, Intravenous, PRN, Burns, Wandra Feinstein, NP  ROS  Constitutional: Denies any fever or chills Gastrointestinal: No reported hemesis, hematochezia, vomiting, or acute GI distress Musculoskeletal: Denies any acute onset joint swelling, redness, loss of ROM, or weakness Neurological: No reported episodes of acute onset apraxia, aphasia, dysarthria, agnosia, amnesia, paralysis, loss of coordination, or loss of consciousness  Allergies  Philip [name] is allergic to iodinated diagnostic agents; tylenol [acetaminophen]; amitriptyline; aspirin; nsaids; ibuprofen; and tape.  PFSH  Drug: Philip [name]  reports that he does not use drugs. Alcohol:  reports that he drinks alcohol. Tobacco:  reports that he has been smoking cigarettes. He has been smoking about 0.25 packs per day. He has never used smokeless tobacco. 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 Surgical: Philip [name]  has a past surgical history that includes Coronary artery bypass graft (N/A, 08/25/2014); TEE without cardioversion (N/A, 08/25/2014); Cardiac catheterization (N/A, 08/24/2014); Cardiac catheterization (N/A, 08/24/2014); Cardiac catheterization (N/A, 03/30/2015); PORTA CATH INSERTION (N/A, 07/27/2016); Incision and drainage abscess (Left, 11/15/2016); Esophagogastroduodenoscopy (egd) with propofol (N/A, 08/08/2017); and PEG placement (N/A, 10/01/2017). Family: family history includes Breast cancer in his sister; Colon cancer in his paternal grandfather; Heart attack in his brother, brother, and sister; Heart attack (age of onset: 46) in his mother; Hyperlipidemia in his mother; Hypertension in his father; Lung cancer in his father.  Constitutional Exam  General appearance: Well nourished, well developed, and well hydrated. In no apparent acute distress Vitals:   12/11/17 1137  BP: 116/89  Pulse: 89  Temp: 98 F (36.7 C)  SpO2: 100%  Weight: 114 lb 11.2 oz (52 kg)  Height: '5\' 11"'  (1.803 m)   BMI Assessment: Estimated body mass index is 16 kg/m as calculated from the following:   Height as of this encounter: '5\' 11"'  (1.803 m).   Weight as of this encounter: 114 lb 11.2 oz (52 kg).  BMI interpretation table: BMI level Category Range association with higher incidence of chronic pain  <18 kg/m2 Underweight   18.5-24.9 kg/m2 Ideal body weight   25-29.9 kg/m2 Overweight Increased incidence by 20%  30-34.9 kg/m2 Obese (Class I) Increased incidence by 68%  35-39.9 kg/m2 Severe obesity (Class II) Increased incidence by 136%  >40 kg/m2 Extreme obesity (Class III) Increased  incidence by 254%   Patient's current BMI Ideal Body weight  Body mass index is 16 kg/m. Ideal body weight: 75.3 kg (166 lb 0.1 oz)   BMI Readings from Last 4 Encounters:  12/11/17 16.00 kg/m  12/09/17 15.96 kg/m  12/02/17 15.86 kg/m  11/27/17 15.62 kg/m   Wt Readings from Last 4 Encounters:  12/11/17 114 lb 11.2 oz (52 kg)  12/09/17 114 lb 7 oz (51.9 kg)  12/02/17 113 lb 11.2 oz (51.6 kg)  11/27/17 112 lb (50.8 kg)  Psych/Mental status: Alert, oriented x 3 (person, place, & time)       Eyes: PERLA Respiratory: No evidence of acute respiratory distress  Cervical Spine Area Exam  Skin & Axial Inspection: No masses, redness, edema, swelling, or associated skin lesions Alignment: Symmetrical Functional ROM: Unrestricted ROM      Stability: No instability detected Muscle Tone/Strength: Functionally intact. No obvious neuro-muscular anomalies detected. Sensory (Neurological): Unimpaired Palpation: No palpable anomalies              Upper Extremity (UE) Exam    Side: Right upper extremity  Side: Left upper extremity  Skin & Extremity Inspection: Skin color, temperature, and hair growth are WNL. No peripheral edema or cyanosis. No masses, redness, swelling, asymmetry, or associated skin lesions. No contractures.  Skin & Extremity Inspection: Skin color, temperature, and hair growth are WNL. No peripheral edema or cyanosis. No masses, redness, swelling, asymmetry, or associated skin lesions. No contractures.  Functional ROM: Unrestricted ROM          Functional ROM: Unrestricted ROM          Muscle Tone/Strength: Functionally intact. No obvious neuro-muscular anomalies detected.  Muscle Tone/Strength: Functionally intact. No obvious neuro-muscular anomalies detected.  Sensory (Neurological): Unimpaired          Sensory (Neurological): Unimpaired          Palpation: No palpable anomalies  Palpation: No palpable anomalies              Provocative Test(s):  Phalen's test:  deferred Tinel's test: deferred Apley's scratch test (touch opposite shoulder):  Action 1 (Across chest): deferred Action 2 (Overhead): deferred Action 3 (LB reach): deferred   Provocative Test(s):  Phalen's test: deferred Tinel's test: deferred Apley's scratch test (touch opposite shoulder):  Action 1 (Across chest): deferred Action 2 (Overhead): deferred Action 3 (LB reach): deferred    Thoracic Spine Area Exam  Skin & Axial Inspection: No masses, redness, or swelling Alignment: Symmetrical Functional ROM: Unrestricted ROM Stability: No instability detected Muscle Tone/Strength: Functionally intact. No obvious neuro-muscular anomalies detected. Sensory (Neurological): Unimpaired Muscle strength & Tone: No palpable anomalies  Lumbar Spine Area Exam  Skin & Axial Inspection: No masses, redness, or swelling Alignment: Symmetrical Functional ROM: Unrestricted ROM       Stability: No instability detected Muscle Tone/Strength: Functionally intact. No obvious neuro-muscular anomalies detected. Sensory (Neurological): Unimpaired Palpation: No palpable anomalies       Provocative Tests: Hyperextension/rotation test: deferred today       Lumbar quadrant test (Kemp's test): deferred today       Lateral bending test: deferred today       Patrick's Maneuver: deferred today                   FABER test: deferred today                   S-I anterior distraction/compression test: deferred today         S-I lateral compression test: deferred today         S-I Thigh-thrust test: deferred today         S-I Gaenslen's test: deferred today          Gait & Posture Assessment  Ambulation: Unassisted Gait: Relatively normal for age and body habitus Posture: WNL   Lower Extremity Exam    Side: Right lower extremity  Side: Left lower extremity  Stability: No instability observed          Stability: No instability observed          Skin & Extremity Inspection: Skin color, temperature, and  hair growth are WNL. No peripheral edema or cyanosis. No masses, redness, swelling, asymmetry, or associated skin lesions. No contractures.  Skin & Extremity Inspection: Skin color, temperature, and hair growth are WNL. No peripheral edema or cyanosis. No masses, redness, swelling, asymmetry, or associated skin lesions. No contractures.  Functional ROM: Unrestricted ROM                  Functional ROM: Unrestricted ROM                  Muscle Tone/Strength: Functionally intact. No obvious neuro-muscular anomalies detected.  Muscle Tone/Strength: Functionally intact. No obvious neuro-muscular anomalies detected.  Sensory (Neurological): Unimpaired  Sensory (Neurological): Unimpaired  Palpation: No palpable anomalies  Palpation: No palpable anomalies   Assessment  Primary Diagnosis & Pertinent Problem List: There were no encounter diagnoses.  Status Diagnosis  Controlled Controlled Controlled No diagnosis found.  Problems updated and reviewed during this visit: No problems updated. Plan of Care  Pharmacotherapy (Medications Ordered): No orders of the defined types were placed in this encounter.  Lab-work, procedure(s), and/or referral(s): No orders of the defined types were placed in this encounter.   Pharmacological management options:  Opioid Analgesics: We'll take over management today. See above orders Membrane  stabilizer: We have discussed the possibility of optimizing this mode of therapy, if tolerated Muscle relaxant: We have discussed the possibility of a trial NSAID: We have discussed the possibility of a trial Other analgesic(s): To be determined at a later time   Interventional management options: Planned, scheduled, and/or pending:    ***   Considering:   ***   PRN Procedures:   To be determined at a later time   Provider-requested follow-up: No follow-ups on file.  Future Appointments  Date Time Provider [place]  12/12/2017  2:00 PM Noreene Filbert,  MD [facility] None  12/30/2017 11:00 AM Lequita Asal, MD [facility] None    Primary Care Physician: Lorelee Market, MD Location: [facility] Note by: Gillis Santa, M.D Date: 12/11/2017; Time: 12:11 PM  There are no Patient Instructions on file for this visit.

## 2017-12-11 NOTE — Patient Instructions (Addendum)
1. Sign opioid contract  2. Rx for Oxycodone and Tizanidine. Stop Robaxin  Rx for oxycodone and tizanidine to last until 02/09/2018 have been escribed to your pharmacy.

## 2017-12-11 NOTE — Progress Notes (Signed)
Safety precautions to be maintained throughout the outpatient stay will include: orient to surroundings, keep bed in low position, maintain call bell within reach at all times, provide assistance with transfer out of bed and ambulation.  

## 2017-12-11 NOTE — Progress Notes (Signed)
Patient's Name: Philip [name]  MRN: [id number]  Referring Provider: Lorelee Market, MD  DOB: 09-14-1960  PCP: Lorelee Market, MD  DOS: 12/11/2017  Note by: Gillis Santa, MD  Service setting: Ambulatory outpatient  Specialty: Interventional Pain Management  Location: [facility] (AMB) Pain Management Facility    Patient type: Established   Primary Reason(s) for Visit: Encounter for evaluation before starting new chronic pain management plan of care (Level of risk: moderate) CC: Medication Refill  HPI  Philip [name] is a 57 y.o. year old, male patient, who comes today for a follow-up evaluation to review the test results and decide on a treatment plan. He has Bilateral foot pain; Tobacco use disorder; Convulsions ([place]); Preventative health care; Right ankle pain; History of MI (myocardial infarction); CAD (coronary artery disease), native coronary artery; S/P CABG x 3; Acute myocardial infarction of inferior wall ([place]); Acute sinusitis, unspecified; Hypertension; Pain in joint involving ankle and foot; Pain in soft tissues of limb; Status post aorto-coronary artery bypass graft; Pure hypercholesterolemia; Rash and other nonspecific skin eruption; PAD (peripheral artery disease) ([place]); Hyperlipidemia, unspecified; Peripheral neuropathy; Iron deficiency anemia; Lymphadenopathy, mediastinal; Weight loss; Night sweats; Small cell lung cancer ([place]); Bone metastasis ([place]); Postobstructive pneumonia; Goals of care, counseling/discussion; Acute CVA (cerebrovascular accident) ([place]); Cancer of upper lobe of left lung ([place]); Cancer related pain; Cough; Routine history and physical examination of adult; Hyponatremia; Hypocalcemia; Hypomagnesemia; Charcot's joint of foot (Right); Chronic foot pain (Left); Chronic pain syndrome; Dry gangrene ([place]); GERD (gastroesophageal reflux disease); Myocardial infarction ([place]); Neuropathic pain; Osteopenia of both feet; Encounter for antineoplastic chemotherapy; Chronic thoracic spine  pain; Antineoplastic chemotherapy induced anemia; Abscess of left hand; Non-intractable vomiting with nausea; Hypokalemia; Dehydration; Skin abscess; Lung cancer metastatic to bone ([place]); Neoplasm of thoracic spine; Marijuana user; Abnormal drug screen; Weakness; Intractable nausea and vomiting; Protein-calorie malnutrition, severe; Adrenal insufficiency ([place]); Folate deficiency; Metastasis to brain [facility]); White matter disease; Enteropathogenic Escherichia coli infection; and Left upper lobe pulmonary nodule on their problem list. His primarily concern today is the Medication Refill  Pain Assessment: Location: Right, Left Leg(bilateral) Radiating: From hips bilateral, down to knee, and feel all bilateral Onset: More than a month ago Duration: Chronic pain Quality: Aching, Constant, Sharp Severity: 8 /10 (subjective, self-reported pain score)  Note: Reported level is inconsistent with clinical observations.                         When using our objective Pain Scale, levels between 6 and 10/10 are said to belong in an emergency room, as it progressively worsens from a 6/10, described as severely limiting, requiring emergency care not usually available at an outpatient pain management facility. At a 6/10 level, communication becomes difficult and requires great effort. Assistance to reach the emergency department may be required. Facial flushing and profuse sweating along with potentially dangerous increases in heart rate and blood pressure will be evident. Effect on ADL: "Unable to get up"  Timing: Constant Modifying factors: Denies  BP: 116/89  HR: 65  Philip [name] comes in today for a follow-up visit after his initial evaluation on 11/27/2017. Today we went over the results of his tests. These were explained in "Layman's terms". During today's appointment we went over my diagnostic impression, as well as the proposed treatment plan.   57 year old male with history of chronic pain secondary to small  cell lung cancer.  Patient presents today for medication management.  He states that his muscle relaxant was not as  effective as tizanidine which he was prescribed before.  Of note patient had repeat urine drug screen done and his quantitative THC levels have been decreasing.  Patient states that he has been refraining from [facility].  In considering the treatment plan options, Philip [name] was reminded that I no longer take patients for medication management only. I asked him to let me know if he had no intention of taking advantage of the interventional therapies, so that we could make arrangements to provide this space to someone interested. I also made it clear that undergoing interventional therapies for the purpose of getting pain medications is very inappropriate on the part of a patient, and it will not be tolerated in this practice. This type of behavior would suggest true addiction and therefore it requires referral to an addiction specialist.   Further details on both, my assessment(s), as well as the proposed treatment plan, please see below.  Controlled Substance Pharmacotherapy Assessment REMS (Risk Evaluation and Mitigation Strategy)  Analgesic: We will start oxycodone 5 mill grams 3 times daily as needed MME/day: 22.5 mg/day. Pill Count: None expected due to no prior prescriptions written by our practice. Janne Napoleon, RN  12/11/2017 11:47 AM  Sign at close encounter Safety precautions to be maintained throughout the outpatient stay will include: orient to surroundings, keep bed in low position, maintain call bell within reach at all times, provide assistance with transfer out of bed and ambulation.    Pharmacokinetics: Liberation and absorption (onset of action): WNL Distribution (time to peak effect): WNL Metabolism and excretion (duration of action): WNL         Pharmacodynamics: Desired effects: Analgesia: Philip [name] reports >50% benefit. Functional ability: Patient reports that  medication allows him to accomplish basic ADLs Clinically meaningful improvement in function (CMIF): Sustained CMIF goals met Perceived effectiveness: Described as relatively effective, allowing for increase in activities of daily living (ADL) Undesirable effects: Side-effects or Adverse reactions: None reported Monitoring: [place] PMP: Online review of the past 54-monthperiod previously conducted. Not applicable at this point since we have not taken over the patient's medication management yet. List of other Serum/Urine Drug Screening Test(s):  Lab Results  Component Value Date   AMPHSCRSER Negative 10/18/2017   AMPHSCRSER Negative 04/17/2017   BARBSCRSER Negative 10/18/2017   BARBSCRSER Negative 04/17/2017   BENZOSCRSER Negative 10/18/2017   BENZOSCRSER Negative 04/17/2017   COCAINSCRSER Negative 10/18/2017   COCAINSCRSER Negative 04/17/2017   COCAINSCRNUR NONE DETECTED 10/18/2017   COCAINSCRNUR NONE DETECTED 04/17/2017   COCAINSCRNUR NONE DETECTED 04/11/2017   COCAINSCRNUR NONE DETECTED 07/26/2016   PCPSCRSER Negative 10/18/2017   PCPSCRSER Negative 04/17/2017   THCSCRSER ++POSITIVE++ 10/18/2017   THCSCRSER ++POSITIVE++ 04/17/2017   THCU POSITIVE (A) 10/18/2017   THCU NONE DETECTED 04/17/2017   THCU POSITIVE (A) 04/11/2017   THCU POSITIVE (A) 07/26/2016   OPIATESCRSER Negative 10/18/2017   OPIATESCRSER Negative 04/17/2017   OXYSCRSER ++POSITIVE++ 10/18/2017   OXYSCRSER ++POSITIVE++ 04/17/2017   PROPOXSCRSER Negative 10/18/2017   PROPOXSCRSER Negative 04/17/2017   List of all UDS test(s) done:  Lab Results  Component Value Date   SUMMARY FINAL 11/27/2017   SUMMARY FINAL 10/31/2017   Last UDS on record: Summary  Date Value Ref Range Status  11/27/2017 FINAL  Final    Comment:    ==================================================================== TOXASSURE COMP DRUG ANALYSIS,UR ==================================================================== Test  Result       Flag       Units Drug Present and Declared for Prescription Verification   Pregabalin                     PRESENT      EXPECTED   Diphenhydramine                PRESENT      EXPECTED   Metoprolol                     PRESENT      EXPECTED Drug Present not Declared for Prescription Verification   Carboxy-THC                    90           UNEXPECTED ng/mg creat    Carboxy-THC is a metabolite of tetrahydrocannabinol  (THC).    Source of [name] is most commonly illicit, but THC is also present    in a scheduled prescription medication.   Tizanidine                     PRESENT      UNEXPECTED Drug Absent but Declared for Prescription Verification   Methocarbamol                  Not Detected UNEXPECTED   Promethazine                   Not Detected UNEXPECTED   Lidocaine                      Not Detected UNEXPECTED    Lidocaine, as indicated in the declared medication list, is not    always detected even when used as directed. ==================================================================== Test                      Result    Flag   Units      Ref Range   Creatinine              194              mg/dL      >=20 ==================================================================== Declared Medications:  The flagging and interpretation on this report are based on the  following declared medications.  Unexpected results may arise from  inaccuracies in the declared medications.  **Note: The testing scope of this panel includes these medications:  Diphenhydramine (Benadryl)  Methocarbamol (Robaxin)  Metoprolol (Lopressor)  Pregabalin (Lyrica)  Promethazine (Phenergan)  **Note: The testing scope of this panel does not include small to  moderate amounts of these reported medications:  Lidocaine (Lidocaine-Prilocaine)  **Note: The testing scope of this panel does not include following  reported medications:  Albuterol (ProAir HFA)  Amoxicillin (Augmentin)  Atorvastatin  (Lipitor)  Azithromycin (Zithromax)  Clavulinate (Augmentin)  Clopidogrel (Plavix)  Dexamethasone (Decadron)  Docusate (Senokot-S)  Famotidine (Pepcid)  Fluticasone (Flovent)  Folic acid (Folvite)  Hydrochlorothiazide (Hydrodiuril)  Magnesium (Mag-Ox)  Metoclopramide (Reglan)  Multivitamin  Olodaterol (Stiolto)  Ondansetron (Zofran)  Pantoprazole (Protonix)  Potassium  Prilocaine (Lidocaine-Prilocaine)  Sennosides (Senokot-S)  Sucralfate (Carafate)  Supplement  Tiotropium (Stiolto)  Vitamin B1 (Thiamine)  Vitamin C  Vitamin D2 (Drisdol) ==================================================================== For clinical consultation, please call [phone]. ====================================================================    UDS interpretation: No unexpected findings.          Medication Assessment Form:  Patient introduced to form today Treatment compliance: Treatment may start today if patient agrees with proposed plan. Evaluation of compliance is not applicable at this point Risk Assessment Profile: Aberrant behavior: See initial evaluations. None observed or detected today Comorbid factors increasing risk of overdose: See initial evaluation. No additional risks detected today Opioid risk tool (ORT) (Total Score): 5 Personal History of Substance Abuse (SUD-Substance use disorder):  Alcohol: Negative  Illegal Drugs: Positive Male or Male  Rx Drugs: Negative  ORT Risk Level calculation: Moderate Risk Risk of substance use disorder (SUD): Moderate Opioid Risk Tool - 12/11/17 1146      Family History of Substance Abuse   Alcohol  Positive Male    Illegal Drugs  Negative    Rx Drugs  Negative      Personal History of Substance Abuse   Alcohol  Negative    Illegal Drugs  Positive Male or Male    Rx Drugs  Negative      Age   Age between 33-45 years   No      History of Preadolescent Sexual Abuse   History of Preadolescent Sexual Abuse  Negative or Male       Psychological Disease   Psychological Disease  Negative    Depression  Negative      Total Score   Opioid Risk Tool Scoring  5    Opioid Risk Interpretation  Moderate Risk      ORT Scoring interpretation table:  Score <3 = Low Risk for SUD  Score between 4-7 = Moderate Risk for SUD  Score >8 = High Risk for Opioid Abuse   Risk Mitigation Strategies:  Patient opioid safety counseling: Completed today. Counseling provided to patient as per "Patient Counseling Document". Document signed by patient, attesting to counseling and understanding Patient-Prescriber Agreement (PPA): Obtained today.  Controlled substance notification to other providers: Written and sent today.  Pharmacologic Plan: Today we may be taking over the patient's pharmacological regimen. See below.             Laboratory Chemistry  Inflammation Markers (CRP: Acute Phase) (ESR: Chronic Phase) Lab Results  Component Value Date   CRP 1.3 (H) 11/15/2016   ESRSEDRATE 92 (H) 11/15/2016   LATICACIDVEN 0.6 08/26/2017                         Rheumatology Markers Lab Results  Component Value Date   LABURIC 4.2 (L) 06/28/2016                        Renal Function Markers Lab Results  Component Value Date   BUN 6 12/09/2017   CREATININE 0.69 (L) 12/09/2017   BCR 9 12/09/2017   GFRAA 122 12/09/2017   GFRNONAA 105 12/09/2017                             Hepatic Function Markers Lab Results  Component Value Date   AST 11 12/09/2017   ALT 9 12/09/2017   ALBUMIN 2.6 (L) 12/09/2017   ALKPHOS 89 12/09/2017   LIPASE 37 08/26/2017                        Electrolytes Lab Results  Component Value Date   NA 138 12/09/2017   K 4.4 12/09/2017   CL 103 12/09/2017   CALCIUM 8.3 (L) 12/09/2017   MG 1.7  12/02/2017   PHOS 4.6 10/03/2017                        Neuropathy Markers Lab Results  Component Value Date   VITAMINB12 283 12/09/2017   FOLATE 17.6 12/09/2017   HGBA1C 5.1 07/27/2016   HIV Non  Reactive 08/27/2017                        Bone Pathology Markers No results found for: VD25OH, [id number], [id number], [id number], 25OHVITD1, 25OHVITD2, 25OHVITD3, TESTOFREE, TESTOSTERONE                       Coagulation Parameters Lab Results  Component Value Date   INR 0.89 08/28/2017   LABPROT 12.0 08/28/2017   APTT 34 07/26/2016   PLT 189 12/09/2017   DDIMER 0.29 07/02/2014                        Cardiovascular Markers Lab Results  Component Value Date   CKTOTAL 48 06/09/2015   TROPONINI <0.03 08/26/2017   HGB 10.9 (L) 12/09/2017   HCT 31.5 (L) 12/09/2017                         CA Markers Lab Results  Component Value Date   CEA 1.7 07/31/2016                        Note: Lab results reviewed.  Recent Diagnostic Imaging Review  Cervical Imaging: Cervical MR wo contrast: No results found for this or any previous visit. Cervical MR wo contrast: No procedure found. Cervical MR w/wo contrast: No results found for this or any previous visit. Cervical MR w contrast: No results found for this or any previous visit. Cervical CT wo contrast:  Results for orders placed during the hospital encounter of 06/21/17  CT Cervical Spine Wo Contrast   Narrative CLINICAL DATA:  The patient suffered a syncopal episode with a fall and blow to the left side of the head on 06/19/2017. Blurry vision yesterday. The patient is anticoagulated. History of metastatic small cell carcinoma.  EXAM: CT HEAD WITHOUT CONTRAST  CT CERVICAL SPINE WITHOUT CONTRAST  TECHNIQUE: Multidetector CT imaging of the head and cervical spine was performed following the standard protocol without intravenous contrast. Multiplanar CT image reconstructions of the cervical spine were also generated.  COMPARISON:  Brain MRI 07/26/2016. Whole-body bone scan 04/10/2017. PET CT scan 07/09/2016, 11/12/2016 and 04/26/2017.  FINDINGS: CT HEAD FINDINGS  Brain: No evidence of acute infarction, hemorrhage,  hydrocephalus, extra-axial collection or mass lesion/mass effect. Mild cortical atrophy and chronic microvascular ischemic change are noted.  Vascular: Advanced for age appearing atherosclerosis is identified.  Skull: Intact.  Sinuses/Orbits: Small mastoid effusions are present bilaterally. Paranasal sinuses are clear.  Other: None.  CT CERVICAL SPINE FINDINGS  Alignment: Maintained.  Skull base and vertebrae: No fracture. Abnormal sclerosis is present throughout almost the entire C5 vertebral body which is new since the 07/09/2016 PET CT scan but is seen on the most recent PET CT.  Soft tissues and spinal canal: No prevertebral fluid or swelling. No visible canal hematoma. Carotid atherosclerosis noted.  Disc levels:  Intervertebral disc spaces appear normal.  Upper chest: Emphysematous changes seen in the lung apices.  Other: Negative.  IMPRESSION: No acute abnormality head or cervical spine.  Sclerotic lesion throughout the  C5 vertebral body consistent with metastatic disease is unchanged since the most recent comparison exam.  Mild atrophy and chronic microvascular ischemic change.  Atherosclerosis.   Electronically Signed   By: Inge Rise M.D.   On: 06/21/2017 13:04    Shoulder-R DG:  Results for orders placed during the hospital encounter of 09/10/17  DG Shoulder Right   Narrative CLINICAL DATA:  Trip and fall 3 days ago.  EXAM: RIGHT SHOULDER - 2+ VIEW  COMPARISON:  None.  FINDINGS: The humeral head is well-formed and located. The subacromial, glenohumeral and acromioclavicular joint spaces are intact. No destructive bony lesions. Soft tissue planes are non-suspicious. RIGHT chest Port-A-Cath. Status post median sternotomy. Calcified aortic arch. Faint calcifications RIGHT neck are likely vascular.  IMPRESSION: Negative.  Aortic Atherosclerosis (ICD10-I70.0).   Electronically Signed   By: Elon Alas M.D.   On: 09/10/2017  13:34     Hip-L DG 2-3 views:  Results for orders placed during the hospital encounter of 09/02/17  DG HIP UNILAT WITH PELVIS 2-3 VIEWS LEFT   Narrative CLINICAL DATA:  LEFT hip pain, history of metastatic lung cancer  EXAM: DG HIP (WITH OR WITHOUT PELVIS) 2-3V LEFT  COMPARISON:  None  FINDINGS: Diffuse osseous demineralization.  Degenerative changes of both hip joints with joint space narrowing and spur formation, greater on LEFT.  No acute fracture, dislocation, or bone destruction.  Scattered atherosclerotic calcifications.  IMPRESSION: Degenerative changes of BILATERAL hip joints, LEFT greater than RIGHT.   Electronically Signed   By: Lavonia Dana M.D.   On: 09/03/2017 07:53     Ankle Imaging: Ankle-R DG Complete:  Results for orders placed in visit on 07/27/14  DG Ankle Complete Right   Ankle-L DG Complete: No results found for this or any previous visit.  Foot Imaging: Foot-R DG Complete:  Results for orders placed during the hospital encounter of 11/18/14  DG Foot Complete Right   Narrative CLINICAL DATA:  Right ankle and proximal foot pain for the past 9 months without known injury, weakness and inability to bear full weight, no report of injury  EXAM: RIGHT FOOT COMPLETE - 3+ VIEW  COMPARISON:  MRI of the foot dated July 30, 2014.  FINDINGS: The bones are mildly osteopenic diffusely. There is no acute or healing or old fracture. The joint spaces are reasonably well-maintained. There is a small plantar calcaneal spur. The soft tissues are unremarkable.  IMPRESSION: Diffuse osteopenia without acute bony abnormality. The osteopenia is is similar to that of disuse. No periosteal reaction is observed.   Electronically Signed   By: David  Martinique M.D.   On: 11/18/2014 12:29    Foot-L DG Complete: No results found for this or any previous visit.  Complexity Note: Imaging results reviewed. Results shared with Philip [name], using Layman's terms.                          Meds   Current Outpatient Medications:  .  atorvastatin (LIPITOR) 40 MG tablet, TAKE 1 TABLET BY MOUTH DAILY, Disp: 90 tablet, Rfl: 3 .  Calcium Carb-Cholecalciferol (CALCIUM + D3 PO), Take 1 tablet by mouth 3 (three) times daily., Disp: , Rfl:  .  clopidogrel (PLAVIX) 75 MG tablet, Take 1 tablet (75 mg total) by mouth daily., Disp: 90 tablet, Rfl: 3 .  diphenhydrAMINE (BENADRYL) 25 mg capsule, Take 25 mg by mouth 2 (two) times daily as needed for allergies. , Disp: , Rfl:  .  famotidine (PEPCID) 20 MG tablet, Take 1 tablet (20 mg total) by mouth 2 (two) times daily., Disp: 60 tablet, Rfl: 0 .  feeding supplement, ENSURE ENLIVE, (ENSURE ENLIVE) LIQD, Take 237 mLs by mouth 3 (three) times daily between meals., Disp: 237 mL, Rfl: 12 .  FLOVENT HFA 220 MCG/ACT inhaler, INL 2 PFS PO BID, Disp: , Rfl: 5 .  folic acid (FOLVITE) 1 MG tablet, Take 1 tablet (1 mg total) by mouth daily., Disp: 30 tablet, Rfl: 0 .  hydrochlorothiazide (HYDRODIURIL) 12.5 MG tablet, Take 1 tablet by mouth daily., Disp: , Rfl: 2 .  levofloxacin (LEVAQUIN) 750 MG tablet, Take 1 tablet (750 mg total) by mouth daily for 5 days., Disp: 5 tablet, Rfl: 0 .  lidocaine-prilocaine (EMLA) cream, Apply to affected area once, Disp: 30 g, Rfl: 3 .  magnesium oxide (MAG-OX) 400 (241.3 Mg) MG tablet, Take 1 tablet (400 mg total) by mouth 3 (three) times daily., Disp: 90 tablet, Rfl: 3 .  metoCLOPramide (REGLAN) 5 MG tablet, Take 1 tablet (5 mg total) by mouth 4 (four) times daily -  before meals and at bedtime., Disp: 100 tablet, Rfl: 0 .  metoprolol tartrate (LOPRESSOR) 25 MG tablet, TAKE 1 TABLET BY MOUTH TWICE DAILY, Disp: 180 tablet, Rfl: 3 .  Multiple Vitamin (MULTIVITAMIN WITH MINERALS) TABS tablet, Take 1 tablet by mouth daily., Disp: 30 tablet, Rfl: 1 .  ondansetron (ZOFRAN-ODT) 8 MG disintegrating tablet, Take 1 tablet (8 mg total) by mouth every 8 (eight) hours as needed for nausea or vomiting., Disp: 30  tablet, Rfl: 1 .  pantoprazole (PROTONIX) 40 MG tablet, TK 1 T PO QD, Disp: , Rfl: 5 .  potassium phosphate, monobasic, (K-PHOS ORIGINAL) 500 MG tablet, Take 1 tablet (500 mg total) by mouth 4 (four) times daily -  with meals and at bedtime., Disp: 10 tablet, Rfl: 0 .  pregabalin (LYRICA) 50 MG capsule, 50 mg qhs x 1 week, then 50 mg BID week 2, then 50 mg TID thereafter, Disp: 90 capsule, Rfl: 1 .  PROAIR HFA 108 (90 Base) MCG/ACT inhaler, INL 2 PFS PO Q 4 TO 6 H PRN, Disp: , Rfl: 5 .  promethazine (PHENERGAN) 25 MG suppository, Place 1 suppository (25 mg total) rectally every 6 (six) hours as needed for nausea or vomiting., Disp: 12 each, Rfl: 5 .  senna-docusate (SENOKOT-S) 8.6-50 MG tablet, Take 1 tablet by mouth at bedtime as needed for mild constipation., Disp: 30 tablet, Rfl: 0 .  sodium chloride 1 g tablet, Take 1 tablet (1 g total) by mouth 2 (two) times daily with a meal., Disp: 60 tablet, Rfl: 0 .  STIOLTO RESPIMAT 2.5-2.5 MCG/ACT AERS, INL 2 PFS PO QD, Disp: , Rfl: 5 .  sucralfate (CARAFATE) 1 g tablet, Take 1 tablet (1 g total) by mouth 3 (three) times daily., Disp: 90 tablet, Rfl: 1 .  thiamine 100 MG tablet, Take 1 tablet (100 mg total) by mouth daily., Disp: 30 tablet, Rfl: 1 .  vitamin C (VITAMIN C) 500 MG tablet, Take 1 tablet (500 mg total) by mouth 2 (two) times daily., Disp: 60 tablet, Rfl: 1 .  Vitamin D, Ergocalciferol, (DRISDOL) 50000 units CAPS capsule, TK ONE C PO Q WEEK, Disp: , Rfl: 5 .  oxyCODONE (OXY IR/ROXICODONE) 5 MG immediate release tablet, Take 1 tablet (5 mg total) by mouth 3 (three) times daily as needed for severe pain., Disp: 90 tablet, Rfl: 0 .  [START ON 01/10/2018] oxyCODONE (OXY IR/ROXICODONE) 5  MG immediate release tablet, Take 1 tablet (5 mg total) by mouth 3 (three) times daily as needed for severe pain., Disp: 90 tablet, Rfl: 0 .  tiZANidine (ZANAFLEX) 4 MG tablet, Take 1 tablet (4 mg total) by mouth 2 (two) times daily as needed for muscle spasms., Disp:  60 tablet, Rfl: 3 No current facility-administered medications for this visit.   Facility-Administered Medications Ordered in Other Visits:  .  heparin lock flush 100 unit/mL, 500 Units, Intravenous, Once, Corcoran, Melissa C, MD .  heparin lock flush 100 unit/mL, 500 Units, Intravenous, Once, Corcoran, Melissa C, MD .  sodium chloride 0.9 % 1,000 mL with potassium chloride 10 mEq, magnesium sulfate 4 g infusion, , Intravenous, Continuous, Burns, Wandra Feinstein, NP, Stopped at 11/22/17 1201 .  sodium chloride flush (NS) 0.9 % injection 10 mL, 10 mL, Intravenous, PRN, Mike Gip, Melissa C, MD, 10 mL at 09/10/17 0847 .  sodium chloride flush (NS) 0.9 % injection 10 mL, 10 mL, Intravenous, PRN, Burns, Wandra Feinstein, NP  ROS  Constitutional: Denies any fever or chills Gastrointestinal: No reported hemesis, hematochezia, vomiting, or acute GI distress Musculoskeletal: Denies any acute onset joint swelling, redness, loss of ROM, or weakness Neurological: No reported episodes of acute onset apraxia, aphasia, dysarthria, agnosia, amnesia, paralysis, loss of coordination, or loss of consciousness  Allergies  Philip [name] is allergic to iodinated diagnostic agents; tylenol [acetaminophen]; amitriptyline; aspirin; nsaids; ibuprofen; and tape.  PFSH  Drug: Philip [name]  reports that he does not use drugs. Alcohol:  reports that he drinks alcohol. Tobacco:  reports that he has been smoking cigarettes. He has been smoking about 0.25 packs per day. He has never used smokeless tobacco. 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 Surgical: Philip [name]  has a past surgical history that includes Coronary artery bypass graft (N/A,  08/25/2014); TEE without cardioversion (N/A, 08/25/2014); Cardiac catheterization (N/A, 08/24/2014); Cardiac catheterization (N/A, 08/24/2014); Cardiac catheterization (N/A, 03/30/2015); PORTA CATH INSERTION (N/A, 07/27/2016); Incision and drainage abscess (Left, 11/15/2016); Esophagogastroduodenoscopy (egd) with propofol (N/A, 08/08/2017); and PEG placement (N/A, 10/01/2017). Family: family history includes Breast cancer in his sister; Colon cancer in his paternal grandfather; Heart attack in his brother, brother, and sister; Heart attack (age of onset: 66) in his mother; Hyperlipidemia in his mother; Hypertension in his father; Lung cancer in his father.  Constitutional Exam  General appearance: alert, cooperative, cachectic and pale Vitals:   12/11/17 1137  BP: 116/89  Pulse: 89  Temp: 98 F (36.7 C)  SpO2: 100%  Weight: 114 lb 11.2 oz (52 kg)  Height: '5\' 11"'  (1.803 m)   BMI Assessment: Estimated body mass index is 16 kg/m as calculated from the following:   Height as of this encounter: '5\' 11"'  (1.803 m).   Weight as of this encounter: 114 lb 11.2 oz (52 kg).  BMI interpretation table: BMI level Category Range association with higher incidence of chronic pain  <18 kg/m2 Underweight   18.5-24.9 kg/m2 Ideal body weight   25-29.9 kg/m2 Overweight Increased incidence by 20%  30-34.9 kg/m2 Obese (Class I) Increased incidence by 68%  35-39.9 kg/m2 Severe obesity (Class II) Increased incidence by 136%  >40 kg/m2 Extreme obesity (Class III) Increased incidence by 254%   Patient's current BMI Ideal Body weight  Body mass  index is 16 kg/m. Ideal body weight: 75.3 kg (166 lb 0.1 oz)   BMI Readings from Last 4 Encounters:  12/11/17 16.00 kg/m  12/09/17 15.96 kg/m  12/02/17 15.86 kg/m  11/27/17 15.62 kg/m   Wt Readings from Last 4 Encounters:  12/11/17 114 lb 11.2 oz (52 kg)  12/09/17 114 lb 7 oz (51.9 kg)  12/02/17 113 lb 11.2 oz (51.6 kg)  11/27/17 112 lb (50.8 kg)  Psych/Mental status:  Alert, oriented x 3 (person, place, & time)       Eyes: PERLA Respiratory: No evidence of acute respiratory distress  Cervical Spine Area Exam  Skin & Axial Inspection: No masses, redness, edema, swelling, or associated skin lesions Alignment: Symmetrical Functional ROM: Unrestricted ROM      Stability: No instability detected Muscle Tone/Strength: Functionally intact. No obvious neuro-muscular anomalies detected. Sensory (Neurological): Unimpaired Palpation: No palpable anomalies              Upper Extremity (UE) Exam    Side: Right upper extremity  Side: Left upper extremity  Skin & Extremity Inspection: Skin color, temperature, and hair growth are WNL. No peripheral edema or cyanosis. No masses, redness, swelling, asymmetry, or associated skin lesions. No contractures.  Skin & Extremity Inspection: Skin color, temperature, and hair growth are WNL. No peripheral edema or cyanosis. No masses, redness, swelling, asymmetry, or associated skin lesions. No contractures.  Functional ROM: Unrestricted ROM          Functional ROM: Unrestricted ROM          Muscle Tone/Strength: Functionally intact. No obvious neuro-muscular anomalies detected.  Muscle Tone/Strength: Functionally intact. No obvious neuro-muscular anomalies detected.  Sensory (Neurological): Unimpaired          Sensory (Neurological): Unimpaired          Palpation: No palpable anomalies              Palpation: No palpable anomalies              Provocative Test(s):  Phalen's test: deferred Tinel's test: deferred Apley's scratch test (touch opposite shoulder):  Action 1 (Across chest): deferred Action 2 (Overhead): deferred Action 3 (LB reach): deferred   Provocative Test(s):  Phalen's test: deferred Tinel's test: deferred Apley's scratch test (touch opposite shoulder):  Action 1 (Across chest): deferred Action 2 (Overhead): deferred Action 3 (LB reach): deferred    Thoracic Spine Area Exam  Skin & Axial Inspection: No  masses, redness, or swelling Alignment: Symmetrical Functional ROM: Unrestricted ROM Stability: No instability detected Muscle Tone/Strength: Functionally intact. No obvious neuro-muscular anomalies detected. Sensory (Neurological): Unimpaired Muscle strength & Tone: No palpable anomalies  Lumbar Spine Area Exam  Skin & Axial Inspection: No masses, redness, or swelling Alignment: Symmetrical Functional ROM: Decreased ROM       Stability: No instability detected Muscle Tone/Strength: Functionally intact. No obvious neuro-muscular anomalies detected. Sensory (Neurological): Musculoskeletal pain pattern Palpation: No palpable anomalies       Provocative Tests: Hyperextension/rotation test: deferred today       Lumbar quadrant test (Kemp's test): deferred today       Lateral bending test: deferred today       Patrick's Maneuver: deferred today                   FABER test: deferred today                   S-I anterior distraction/compression test: deferred today  S-I lateral compression test: deferred today         S-I Thigh-thrust test: deferred today         S-I Gaenslen's test: deferred today          Gait & Posture Assessment  Ambulation: Patient came in today in a wheel chair Gait: Antalgic Posture: Difficulty with positional changes   Lower Extremity Exam    Side: Right lower extremity  Side: Left lower extremity  Stability: No instability observed          Stability: No instability observed          Skin & Extremity Inspection: Skin color, temperature, and hair growth are WNL. No peripheral edema or cyanosis. No masses, redness, swelling, asymmetry, or associated skin lesions. No contractures.  Skin & Extremity Inspection: Skin color, temperature, and hair growth are WNL. No peripheral edema or cyanosis. No masses, redness, swelling, asymmetry, or associated skin lesions. No contractures.  Functional ROM: Decreased ROM for hip and knee joints          Functional ROM:  Decreased ROM for hip and knee joints          Muscle Tone/Strength: Functionally intact. No obvious neuro-muscular anomalies detected.  Muscle Tone/Strength: Functionally intact. No obvious neuro-muscular anomalies detected.  Sensory (Neurological): Unimpaired  Sensory (Neurological): Unimpaired  Palpation: No palpable anomalies  Palpation: No palpable anomalies   Assessment & Plan  Primary Diagnosis & Pertinent Problem List: The primary encounter diagnosis was Chronic pain syndrome. Diagnoses of Chronic thoracic spine pain, Neuropathic pain, S/P CABG x 3, History of MI (myocardial infarction), Bilateral foot pain, Charcot's joint of foot (Right), Lung cancer metastatic to bone [facility]), and Cancer of upper lobe of left lung ([place]) were also pertinent to this visit.  Visit Diagnosis: 1. Chronic pain syndrome   2. Chronic thoracic spine pain   3. Neuropathic pain   4. S/P CABG x 3   5. History of MI (myocardial infarction)   6. Bilateral foot pain   7. Charcot's joint of foot (Right)   8. Lung cancer metastatic to bone ([place])   9. Cancer of upper lobe of left lung ([place])   General Recommendations: The pain condition that the patient suffers from is best treated with a multidisciplinary approach that involves an increase in physical activity to prevent de-conditioning and worsening of the pain cycle, as well as psychological counseling (formal and/or informal) to address the co-morbid psychological affects of pain. Treatment will often involve judicious use of pain medications and interventional procedures to decrease the pain, allowing the patient to participate in the physical activity that will ultimately produce long-lasting pain reductions. The goal of the multidisciplinary approach is to return the patient to a higher level of overall function and to restore their ability to perform activities of daily living.    57 year old male with a history of chronic pain secondary to lung cancer status  post chemoradiation now with relapse. Patient was obtaining chronic opioid therapy from his oncologist but had a urine drug screen that was positive for [facility] and was discharged from his oncology practice for pain management. Patient does have a history of peripheral arterial disease as well as coronary artery disease and has left popliteal stent in place. Patient does have a history of chronic tobacco abuse along with a family history of lung cancer. Patient follows up today for consideration of chronic opioid therapy in the context of relapse of his lung cancer.Patient has previously tried gabapentin, amitriptyline, nortriptyline  along with various NSAIDs. Gabapentin resulted in side effects of sedation and vivid dreams. He also had cognitive side effects with amitriptyline and NSAID made him vomit. Patient has lost a significant amount of weight given his malignancy. Patient does utilize marijuana for appetite stimulation and for nausea management. His oxycodone dose is 5 mg 3 times daily to 4 times daily as needed.   Patient presents today for medication management.  He states that his muscle relaxant was not as effective as tizanidine which he was prescribed before.  Of note patient had repeat urine drug screen done and his quantitative THC levels have been decreasing.  Patient states that he has been refraining from [facility].  Will have patient sign opioid contract and review pain clinic policies with patient.  Plan of Care  Pharmacotherapy (Medications Ordered): Meds ordered this encounter  Medications  . tiZANidine (ZANAFLEX) 4 MG tablet    Sig: Take 1 tablet (4 mg total) by mouth 2 (two) times daily as needed for muscle spasms.    Dispense:  60 tablet    Refill:  3  . oxyCODONE (OXY IR/ROXICODONE) 5 MG immediate release tablet    Sig: Take 1 tablet (5 mg total) by mouth 3 (three) times daily as needed for severe pain.    Dispense:  90 tablet    Refill:  0    Do not place this  medication, or any other prescription from our practice, on "Automatic Refill". Patient may have prescription filled one day early if pharmacy is closed on scheduled refill date.  Marland Kitchen oxyCODONE (OXY IR/ROXICODONE) 5 MG immediate release tablet    Sig: Take 1 tablet (5 mg total) by mouth 3 (three) times daily as needed for severe pain.    Dispense:  90 tablet    Refill:  0    Do not place this medication, or any other prescription from our practice, on "Automatic Refill". Patient may have prescription filled one day early if pharmacy is closed on scheduled refill date.   Time Note: Greater than 50% of the 25 minute(s) of face-to-face time spent with Philip [name], was spent in counseling/coordination of care regarding: Philip [name] primary cause of pain, the treatment plan, medication side effects, the opioid analgesic risks and possible complications, the appropriate use of his medications, realistic expectations, the goals of pain management (increased in functionality) and the medication agreement.  Provider-requested follow-up: Return in about 8 weeks (around 02/05/2018) for Medication Management.  Future Appointments  Date Time Provider [place]  12/30/2017 11:00 AM Lequita Asal, MD [facility] None  02/04/2018  1:15 PM Gillis Santa, MD [facility] None    Primary Care Physician: Lorelee Market, MD Location: [facility] Note by: Gillis Santa, M.D Date: 12/11/2017; Time: 8:26 AM  Patient Instructions  1. Sign opioid contract  2. Rx for Oxycodone and Tizanidine. Stop Robaxin  Rx for oxycodone and tizanidine to last until 02/09/2018 have been escribed to your pharmacy.

## 2017-12-12 ENCOUNTER — Ambulatory Visit: Payer: Medicaid Other | Attending: Radiation Oncology | Admitting: Radiation Oncology

## 2017-12-17 ENCOUNTER — Telehealth: Payer: Self-pay | Admitting: *Deleted

## 2017-12-17 NOTE — Telephone Encounter (Signed)
Called patient and LVM to inquire if patient is feeling better after receiving ABX. Also asked about whether or not he has increased SOB, cough or fever.  Asked him to call back and leave me a message.

## 2017-12-17 NOTE — Telephone Encounter (Signed)
-----   Message from Karen Kitchens, NP sent at 12/14/2017 11:22 PM EDT ----- Regarding: Follow up Rodena Piety...  Can you follow up with Mr. Orvis to see if he improved on the ABX? I put him on a 5 day course of levofloxacin on the 27th. Just want to increase no increased SOB, cough, or fevers. Overall, is he feeling better?  Thanks, Gaspar Bidding

## 2017-12-18 ENCOUNTER — Telehealth: Payer: Self-pay | Admitting: *Deleted

## 2017-12-18 NOTE — Telephone Encounter (Signed)
Called patient to inquire how he is feeling since antibiotics.  Patient states he finished antibiotics on Monday.  He still has a residual cough but is feeling better.  Patient states he had a fall yesterday.  States he was at his daughter's house in the garage and stumbled over one of the grandchildren's toys.  States he injured his neck, shoulder and struck his head again.  He states he is bruised pretty badly, but has not sought medical attention.

## 2017-12-18 NOTE — Telephone Encounter (Signed)
  Patient should be seen by his PCP or symptom management.  M

## 2017-12-18 NOTE — Telephone Encounter (Signed)
-----   Message from Karen Kitchens, NP sent at 12/14/2017 11:22 PM EDT ----- Regarding: Follow up Rodena Piety...  Can you follow up with Mr. Mulvehill to see if he improved on the ABX? I put him on a 5 day course of levofloxacin on the 27th. Just want to increase no increased SOB, cough, or fevers. Overall, is he feeling better?  Thanks, Gaspar Bidding

## 2017-12-23 ENCOUNTER — Other Ambulatory Visit: Payer: Self-pay

## 2017-12-23 ENCOUNTER — Emergency Department: Payer: Medicaid Other

## 2017-12-23 ENCOUNTER — Inpatient Hospital Stay
Admission: EM | Admit: 2017-12-23 | Discharge: 2017-12-26 | DRG: 054 | Disposition: A | Discharge status: Hospice | Payer: Medicaid Other | Attending: Internal Medicine | Admitting: Internal Medicine

## 2017-12-23 DIAGNOSIS — M14679 Charcot's joint, unspecified ankle and foot: Secondary | ICD-10-CM | POA: Diagnosis present

## 2017-12-23 DIAGNOSIS — Z801 Family history of malignant neoplasm of trachea, bronchus and lung: Secondary | ICD-10-CM

## 2017-12-23 DIAGNOSIS — I119 Hypertensive heart disease without heart failure: Secondary | ICD-10-CM | POA: Diagnosis present

## 2017-12-23 DIAGNOSIS — C7931 Secondary malignant neoplasm of brain: Principal | ICD-10-CM | POA: Diagnosis present

## 2017-12-23 DIAGNOSIS — E43 Unspecified severe protein-calorie malnutrition: Secondary | ICD-10-CM | POA: Diagnosis present

## 2017-12-23 DIAGNOSIS — Z955 Presence of coronary angioplasty implant and graft: Secondary | ICD-10-CM

## 2017-12-23 DIAGNOSIS — Z66 Do not resuscitate: Secondary | ICD-10-CM | POA: Diagnosis not present

## 2017-12-23 DIAGNOSIS — G9341 Metabolic encephalopathy: Secondary | ICD-10-CM | POA: Diagnosis present

## 2017-12-23 DIAGNOSIS — Z8249 Family history of ischemic heart disease and other diseases of the circulatory system: Secondary | ICD-10-CM

## 2017-12-23 DIAGNOSIS — Z681 Body mass index (BMI) 19 or less, adult: Secondary | ICD-10-CM

## 2017-12-23 DIAGNOSIS — R4182 Altered mental status, unspecified: Secondary | ICD-10-CM

## 2017-12-23 DIAGNOSIS — F17210 Nicotine dependence, cigarettes, uncomplicated: Secondary | ICD-10-CM | POA: Diagnosis present

## 2017-12-23 DIAGNOSIS — Z9221 Personal history of antineoplastic chemotherapy: Secondary | ICD-10-CM

## 2017-12-23 DIAGNOSIS — E876 Hypokalemia: Secondary | ICD-10-CM | POA: Diagnosis present

## 2017-12-23 DIAGNOSIS — Z803 Family history of malignant neoplasm of breast: Secondary | ICD-10-CM

## 2017-12-23 DIAGNOSIS — Z515 Encounter for palliative care: Secondary | ICD-10-CM | POA: Diagnosis not present

## 2017-12-23 DIAGNOSIS — C7951 Secondary malignant neoplasm of bone: Secondary | ICD-10-CM | POA: Diagnosis present

## 2017-12-23 DIAGNOSIS — Z951 Presence of aortocoronary bypass graft: Secondary | ICD-10-CM

## 2017-12-23 DIAGNOSIS — E785 Hyperlipidemia, unspecified: Secondary | ICD-10-CM | POA: Diagnosis present

## 2017-12-23 DIAGNOSIS — I739 Peripheral vascular disease, unspecified: Secondary | ICD-10-CM | POA: Diagnosis present

## 2017-12-23 DIAGNOSIS — C3490 Malignant neoplasm of unspecified part of unspecified bronchus or lung: Secondary | ICD-10-CM | POA: Diagnosis present

## 2017-12-23 DIAGNOSIS — G9340 Encephalopathy, unspecified: Secondary | ICD-10-CM | POA: Diagnosis present

## 2017-12-23 DIAGNOSIS — I2510 Atherosclerotic heart disease of native coronary artery without angina pectoris: Secondary | ICD-10-CM | POA: Diagnosis present

## 2017-12-23 DIAGNOSIS — Z91041 Radiographic dye allergy status: Secondary | ICD-10-CM

## 2017-12-23 DIAGNOSIS — G9389 Other specified disorders of brain: Secondary | ICD-10-CM | POA: Diagnosis present

## 2017-12-23 DIAGNOSIS — Y842 Radiological procedure and radiotherapy as the cause of abnormal reaction of the patient, or of later complication, without mention of misadventure at the time of the procedure: Secondary | ICD-10-CM | POA: Diagnosis present

## 2017-12-23 DIAGNOSIS — Z8349 Family history of other endocrine, nutritional and metabolic diseases: Secondary | ICD-10-CM

## 2017-12-23 DIAGNOSIS — Z800 Family history of malignant neoplasm of digestive organs: Secondary | ICD-10-CM

## 2017-12-23 DIAGNOSIS — I252 Old myocardial infarction: Secondary | ICD-10-CM

## 2017-12-23 DIAGNOSIS — Z886 Allergy status to analgesic agent status: Secondary | ICD-10-CM

## 2017-12-23 DIAGNOSIS — Z7902 Long term (current) use of antithrombotics/antiplatelets: Secondary | ICD-10-CM

## 2017-12-23 LAB — COMPREHENSIVE METABOLIC PANEL
ALT: 10 U/L (ref 0–44)
AST: 25 U/L (ref 15–41)
Albumin: 2.1 g/dL — ABNORMAL LOW (ref 3.5–5.0)
Alkaline Phosphatase: 94 U/L (ref 38–126)
Anion gap: 12 (ref 5–15)
BUN: 11 mg/dL (ref 6–20)
CO2: 20 mmol/L — AB (ref 22–32)
CREATININE: 0.95 mg/dL (ref 0.61–1.24)
Calcium: 7.6 mg/dL — ABNORMAL LOW (ref 8.9–10.3)
Chloride: 107 mmol/L (ref 98–111)
GFR calc non Af Amer: >60 mL/min (ref >60)
Glucose, Bld: 110 mg/dL — ABNORMAL HIGH (ref 70–99)
POTASSIUM: 3.2 mmol/L — AB (ref 3.5–5.1)
SODIUM: 139 mmol/L (ref 135–145)
Total Bilirubin: 1.6 mg/dL — ABNORMAL HIGH (ref 0.3–1.2)
Total Protein: 4.6 g/dL — ABNORMAL LOW (ref 6.5–8.1)

## 2017-12-23 LAB — MRSA PCR SCREENING: MRSA by PCR: NEGATIVE

## 2017-12-23 LAB — TROPONIN I: Troponin I: <0.03 ng/mL (ref <0.03)

## 2017-12-23 LAB — CBC
HCT: 31.9 % — ABNORMAL LOW (ref 40.0–52.0)
Hemoglobin: 11.2 g/dL — ABNORMAL LOW (ref 13.0–18.0)
MCH: 35.2 pg — ABNORMAL HIGH (ref 26.0–34.0)
MCHC: 35.2 g/dL (ref 32.0–36.0)
MCV: 100.2 fL — ABNORMAL HIGH (ref 80.0–100.0)
PLATELETS: 204 10*3/uL (ref 150–440)
RBC: 3.19 MIL/uL — AB (ref 4.40–5.90)
RDW: 14.4 % (ref 11.5–14.5)
WBC: 6.5 10*3/uL (ref 3.8–10.6)

## 2017-12-23 LAB — LIPASE, BLOOD: Lipase: 37 U/L (ref 11–51)

## 2017-12-23 LAB — AMMONIA: AMMONIA: 11 umol/L (ref 9–35)

## 2017-12-23 MED ORDER — ENOXAPARIN SODIUM 40 MG/0.4ML ~~LOC~~ SOLN
40.0000 mg | SUBCUTANEOUS | Status: DC
  Filled 2017-12-23: qty 0.4

## 2017-12-23 MED ORDER — BUDESONIDE 0.5 MG/2ML IN SUSP
0.5000 mg | Freq: Two times a day (BID) | RESPIRATORY_TRACT | Status: DC
  Filled 2017-12-23 (×3): qty 2

## 2017-12-23 MED ORDER — METOCLOPRAMIDE HCL 10 MG PO TABS
5.0000 mg | ORAL_TABLET | Freq: Three times a day (TID) | ORAL | Status: DC
  Filled 2017-12-23: qty 1

## 2017-12-23 MED ORDER — VITAMIN B-1 100 MG PO TABS: 100.0000 mg | ORAL_TABLET | Freq: Every day | ORAL | Status: DC

## 2017-12-23 MED ORDER — QUETIAPINE FUMARATE 25 MG PO TABS
25.0000 mg | ORAL_TABLET | Freq: Once | ORAL | Status: AC
  Administered 2017-12-23: 25 mg via ORAL
  Filled 2017-12-23: qty 1

## 2017-12-23 MED ORDER — ADULT MULTIVITAMIN W/MINERALS CH: 1.0000 | ORAL_TABLET | Freq: Every day | ORAL | Status: DC

## 2017-12-23 MED ORDER — PANTOPRAZOLE SODIUM 40 MG PO TBEC: 40.0000 mg | DELAYED_RELEASE_TABLET | Freq: Every day | ORAL | Status: DC

## 2017-12-23 MED ORDER — ATORVASTATIN CALCIUM 20 MG PO TABS
40.0000 mg | ORAL_TABLET | Freq: Every day | ORAL | Status: DC
  Filled 2017-12-23: qty 2

## 2017-12-23 MED ORDER — FLUTICASONE PROPIONATE HFA 220 MCG/ACT IN AERO: 2.0000 | INHALATION_SPRAY | Freq: Two times a day (BID) | RESPIRATORY_TRACT | Status: DC

## 2017-12-23 MED ORDER — POTASSIUM CHLORIDE CRYS ER 20 MEQ PO TBCR
40.0000 meq | EXTENDED_RELEASE_TABLET | Freq: Once | ORAL | Status: DC
  Filled 2017-12-23: qty 2

## 2017-12-23 MED ORDER — ALBUTEROL SULFATE (2.5 MG/3ML) 0.083% IN NEBU: 3.0000 mL | INHALATION_SOLUTION | Freq: Four times a day (QID) | RESPIRATORY_TRACT | Status: DC | PRN

## 2017-12-23 MED ORDER — OXYCODONE HCL 5 MG PO TABS
5.0000 mg | ORAL_TABLET | Freq: Three times a day (TID) | ORAL | Status: DC | PRN
  Administered 2017-12-23 – 2017-12-24 (×2): 5 mg via ORAL
  Filled 2017-12-23 (×2): qty 1

## 2017-12-23 MED ORDER — CLOPIDOGREL BISULFATE 75 MG PO TABS
75.0000 mg | ORAL_TABLET | Freq: Every day | ORAL | Status: DC
  Filled 2017-12-23: qty 1

## 2017-12-23 MED ORDER — MAGNESIUM OXIDE 400 (241.3 MG) MG PO TABS: 400.0000 mg | ORAL_TABLET | Freq: Three times a day (TID) | ORAL | Status: DC

## 2017-12-23 MED ORDER — VITAMIN C 500 MG PO TABS
500.0000 mg | ORAL_TABLET | Freq: Two times a day (BID) | ORAL | Status: DC
  Filled 2017-12-23 (×3): qty 1

## 2017-12-23 MED ORDER — SUCRALFATE 1 G PO TABS
1.0000 g | ORAL_TABLET | Freq: Three times a day (TID) | ORAL | Status: DC
  Filled 2017-12-23: qty 1

## 2017-12-23 MED ORDER — NICOTINE 21 MG/24HR TD PT24
21.0000 mg | MEDICATED_PATCH | Freq: Every day | TRANSDERMAL | Status: DC
  Administered 2017-12-23: 21 mg via TRANSDERMAL
  Filled 2017-12-23: qty 1

## 2017-12-23 MED ORDER — FOLIC ACID 1 MG PO TABS: 1.0000 mg | ORAL_TABLET | Freq: Every day | ORAL | Status: DC

## 2017-12-23 MED ORDER — QUETIAPINE FUMARATE 25 MG PO TABS: 25.0000 mg | ORAL_TABLET | Freq: Two times a day (BID) | ORAL | Status: DC

## 2017-12-23 MED ORDER — CALCIUM CARBONATE-VITAMIN D 500-200 MG-UNIT PO TABS: 1.0000 | ORAL_TABLET | Freq: Three times a day (TID) | ORAL | Status: DC

## 2017-12-23 MED ORDER — ONDANSETRON 4 MG PO TBDP: 8.0000 mg | ORAL_TABLET | Freq: Three times a day (TID) | ORAL | Status: DC | PRN

## 2017-12-23 MED ORDER — TIZANIDINE HCL 4 MG PO TABS
4.0000 mg | ORAL_TABLET | Freq: Two times a day (BID) | ORAL | Status: DC | PRN
  Filled 2017-12-23: qty 1

## 2017-12-23 MED ORDER — FAMOTIDINE 20 MG PO TABS
20.0000 mg | ORAL_TABLET | Freq: Two times a day (BID) | ORAL | Status: DC
  Filled 2017-12-23: qty 1

## 2017-12-23 MED ORDER — SENNOSIDES-DOCUSATE SODIUM 8.6-50 MG PO TABS: 1.0000 | ORAL_TABLET | Freq: Every evening | ORAL | Status: DC | PRN

## 2017-12-23 MED ORDER — METOPROLOL TARTRATE 25 MG PO TABS
25.0000 mg | ORAL_TABLET | Freq: Two times a day (BID) | ORAL | Status: DC
  Filled 2017-12-23: qty 1

## 2017-12-23 MED ORDER — POTASSIUM CHLORIDE IN NACL 20-0.9 MEQ/L-% IV SOLN
INTRAVENOUS | Status: DC
  Administered 2017-12-23: 19:00:00 via INTRAVENOUS
  Filled 2017-12-23 (×3): qty 1000

## 2017-12-23 MED ORDER — ENSURE ENLIVE PO LIQD: 237.0000 mL | Freq: Three times a day (TID) | ORAL | Status: DC

## 2017-12-23 MED ORDER — SODIUM CHLORIDE 0.9 % IV SOLN: 8.0000 mg/h | INTRAVENOUS | Status: DC

## 2017-12-23 MED ORDER — VITAMIN D (ERGOCALCIFEROL) 1.25 MG (50000 UNIT) PO CAPS
50000.0000 [IU] | ORAL_CAPSULE | ORAL | Status: DC
  Filled 2017-12-23: qty 1

## 2017-12-23 MED ORDER — SODIUM CHLORIDE 0.9 % IV SOLN: 80.0000 mg | Freq: Once | INTRAVENOUS | Status: DC

## 2017-12-23 MED ORDER — SODIUM CHLORIDE 0.9 % IV SOLN
INTRAVENOUS | Status: DC
  Administered 2017-12-23: 18:00:00 via INTRAVENOUS

## 2017-12-23 NOTE — ED Notes (Signed)
Pt is resting and awaiting admission.

## 2017-12-23 NOTE — Progress Notes (Signed)
Notified MD pt is refusing to take any medications PO. He is very withdrawn. Per Family he hasn't been taken his medications since last Friday.

## 2017-12-23 NOTE — H&P (Signed)
[facility] NAME: Philip [name]    MR#:  [id number]  DATE OF BIRTH:  Mar 24, 1961  DATE OF ADMISSION:  12/23/2017  PRIMARY CARE PHYSICIAN: Lorelee Market, MD   REQUESTING/REFERRING PHYSICIAN:   CHIEF COMPLAINT:   Chief Complaint  Patient presents with  . Altered Mental Status    HISTORY OF PRESENT ILLNESS: Philip [name]  is a 57 y.o. male with a known history of anemia, coronary artery disease, lung cancer metastatic to bone, charcot joint of the foot, was brought to the emergency room for confusion.  According to family members patient has been confused.  Patient is awake and alert and responds to verbal commands.  Not a great historian does not want to talk much and reviewed information.  Has generalized weakness.  He was worked up with CT head which showed no acute abnormality.  Urine analysis is pending.  Medical service was consulted for further care.  No complaints of any weakness in any part of the body, slurred speech or any numbness or tingling sensation.  PAST MEDICAL HISTORY:   Past Medical History:  Diagnosis Date  . Anemia   . CAD (coronary artery disease)    a. 08/2014 Inf STEMI/CABG x 3 (LIMA->LAD, VG->Diag, RIMA->RCA).  . DVT, recurrent, lower extremity, acute, left ([place]) 2016  . GIB (gastrointestinal bleeding)    a. In the setting of DAPT-->tolerating plavix only.  . Hyperlipidemia   . Hypertensive heart disease   . Lung cancer ([place])    a. s/p chemo/radiation.  Marland Kitchen PAD (peripheral artery disease) ([employer])    a. 03/2015 Periph Angio: short occlusion of L pop w/ evidence of embolization into the DP->5.0x50 mm Inova self-expanding stent;  b. 04/08/2015 ABI: R 1.12, L 0.99; c. 01/2017 LE Duplex: bilat SFA obstructive dzs, patent L pop.  . Tobacco abuse   . Toe amputation status, left ([place]) 07/09/2016   2017    PAST SURGICAL HISTORY:  Past Surgical History:  Procedure Laterality Date  . CARDIAC  CATHETERIZATION N/A 08/24/2014   Procedure: Left Heart Cath and Coronary Angiography;  Surgeon: Isaias Cowman, MD;  Location: [facility];  Service: Cardiovascular;  Laterality: N/A;  . CARDIAC CATHETERIZATION N/A 08/24/2014   Procedure: Coronary Stent Intervention;  Surgeon: Isaias Cowman, MD;  Location: [place] [facility];  Service: Cardiovascular;  Laterality: N/A;  . CORONARY ARTERY BYPASS GRAFT N/A 08/25/2014   Procedure: CORONARY ARTERY BYPASS GRAFTING (CABG), ON PUMP, TIMES THREE, USING BILATERAL MAMMARY ARTERIES, RIGHT GREATER SAPHENOUS VEIN HARVESTED ENDOSCOPICALLY;  Surgeon: Melrose Nakayama, MD;  Location: [facility];  Service: Open Heart Surgery;  Laterality: N/A;  . ESOPHAGOGASTRODUODENOSCOPY (EGD) WITH PROPOFOL N/A 08/08/2017   Procedure: ESOPHAGOGASTRODUODENOSCOPY (EGD) WITH PROPOFOL;  Surgeon: Jonathon Bellows, MD;  Location: [facility] ENDOSCOPY;  Service: Gastroenterology;  Laterality: N/A;  . INCISION AND DRAINAGE ABSCESS Left 11/15/2016   Procedure: INCISION AND DRAINAGE ABSCESS OF LEFT WRIST;  Surgeon: Roseanne Kaufman, MD;  Location: [facility];  Service: Orthopedics;  Laterality: Left;  . PEG PLACEMENT N/A 10/01/2017   Procedure: PERCUTANEOUS ENDOSCOPIC GASTROSTOMY (PEG) PLACEMENT;  Surgeon: Lucilla Lame, MD;  Location: [facility] ENDOSCOPY;  Service: Endoscopy;  Laterality: N/A;  . PERIPHERAL VASCULAR CATHETERIZATION N/A 03/30/2015   Procedure: Abdominal Aortogram w/Lower Extremity;  Surgeon: Wellington Hampshire, MD;  Location: [place] [facility];  Service: Cardiovascular;  Laterality: N/A;  . PORTA CATH INSERTION N/A 07/27/2016   Procedure: Glori Luis Cath Insertion;  Surgeon: Algernon Huxley, MD;  Location: [place] [facility];  Service: Cardiovascular;  Laterality: N/A;  . TEE WITHOUT CARDIOVERSION N/A 08/25/2014   Procedure: TRANSESOPHAGEAL ECHOCARDIOGRAM (TEE);  Surgeon: Melrose Nakayama, MD;  Location: [place];  Service: Open Heart Surgery;  Laterality: N/A;    SOCIAL HISTORY:  Social  History   Tobacco Use  . Smoking status: Current Every Day Smoker    Packs/day: 0.25    Types: Cigarettes  . Smokeless tobacco: Never Used  . Tobacco comment: 5-6 cigs in a week.   Substance Use Topics  . Alcohol use: Yes    Alcohol/week: 0.0 standard drinks    Comment: Sometimes on w/e.    FAMILY HISTORY:  Family History  Problem Relation Age of Onset  . Hypertension Father   . Lung cancer Father   . Heart attack Mother 68       STENT  . Hyperlipidemia Mother   . Heart attack Brother   . Heart attack Brother   . Breast cancer Sister   . Heart attack Sister   . Colon cancer Paternal Grandfather     DRUG ALLERGIES:  Allergies  Allergen Reactions  . Iodinated Diagnostic Agents Other (See Comments) and Swelling    Pt states that he got "knots behind his ears" Pt states that he got "knots behind his ears".  . Tylenol [Acetaminophen] Palpitations  . Amitriptyline Other (See Comments)    Burning sensation all over  . Aspirin   . Nsaids Other (See Comments)    Blood in stools  . Ibuprofen Other (See Comments) and Nausea Only    Blood in stools  . Tape Rash and Other (See Comments)    PLEASE USE COBAN WRAP; THE PATIENT'S SKIN TEARS WHEN BANDAGES ARE REMOVED!!    REVIEW OF SYSTEMS:   CONSTITUTIONAL: No fever,has fatigue and weakness.  EYES: No blurred or double vision.  EARS, NOSE, AND THROAT: No tinnitus or ear pain.  RESPIRATORY: No cough, shortness of breath, wheezing or hemoptysis.  CARDIOVASCULAR: No chest pain, orthopnea, edema.  GASTROINTESTINAL: No nausea, vomiting, diarrhea or abdominal pain.  GENITOURINARY: No dysuria, hematuria.  ENDOCRINE: No polyuria, nocturia,  HEMATOLOGY: No anemia, easy bruising or bleeding SKIN: No rash or lesion. MUSCULOSKELETAL: No joint pain or arthritis.   NEUROLOGIC: No tingling, numbness, weakness.  PSYCHIATRY: No anxiety or depression.   MEDICATIONS AT HOME:  Prior to Admission medications   Medication Sig Start Date End  Date Taking? Authorizing Provider  atorvastatin (LIPITOR) 40 MG tablet TAKE 1 TABLET BY MOUTH DAILY 10/01/17  Yes Wellington Hampshire, MD  Calcium Carb-Cholecalciferol (CALCIUM + D3 PO) Take 1 tablet by mouth 3 (three) times daily.   Yes [provider]  clopidogrel (PLAVIX) 75 MG tablet Take 1 tablet (75 mg total) by mouth daily. 11/14/16  Yes Wellington Hampshire, MD  diphenhydrAMINE (BENADRYL) 25 mg capsule Take 25 mg by mouth 2 (two) times daily as needed for allergies.    Yes [provider]  famotidine (PEPCID) 20 MG tablet Take 1 tablet (20 mg total) by mouth 2 (two) times daily. 08/30/17  Yes Vaughan Basta, MD  feeding supplement, ENSURE ENLIVE, (ENSURE ENLIVE) LIQD Take 237 mLs by mouth 3 (three) times daily between meals. 08/30/17  Yes Vaughan Basta, MD  FLOVENT HFA 220 MCG/ACT inhaler Inhale 2 puffs into the lungs 2 (two) times daily.  02/01/17  Yes [provider]  folic acid (FOLVITE) 1 MG tablet Take 1 tablet (1 mg total) by mouth daily. 08/30/17  Yes  Vaughan Basta, MD  hydrochlorothiazide (HYDRODIURIL) 12.5 MG tablet Take 1 tablet by mouth daily. 09/06/17  Yes [provider]  lidocaine-prilocaine (EMLA) cream Apply to affected area once 10/10/17  Yes Karen Kitchens, NP  magnesium oxide (MAG-OX) 400 (241.3 Mg) MG tablet Take 1 tablet (400 mg total) by mouth 3 (three) times daily. 05/16/17  Yes Karen Kitchens, NP  metoCLOPramide (REGLAN) 5 MG tablet Take 1 tablet (5 mg total) by mouth 4 (four) times daily -  before meals and at bedtime. 08/30/17  Yes Vaughan Basta, MD  metoprolol tartrate (LOPRESSOR) 25 MG tablet TAKE 1 TABLET BY MOUTH TWICE DAILY 12/05/16  Yes Wellington Hampshire, MD  Multiple Vitamin (MULTIVITAMIN WITH MINERALS) TABS tablet Take 1 tablet by mouth daily. 08/31/17  Yes Vaughan Basta, MD  ondansetron (ZOFRAN-ODT) 8 MG disintegrating tablet Take 1 tablet (8 mg total) by mouth every 8 (eight) hours as needed for  nausea or vomiting. 08/02/17  Yes Karen Kitchens, NP  oxyCODONE (OXY IR/ROXICODONE) 5 MG immediate release tablet Take 1 tablet (5 mg total) by mouth 3 (three) times daily as needed for severe pain. 12/11/17 01/10/18 Yes Gillis Santa, MD  pantoprazole (PROTONIX) 40 MG tablet Take 40 mg by mouth daily.  10/01/17  Yes [provider]  potassium phosphate, monobasic, (K-PHOS ORIGINAL) 500 MG tablet Take 1 tablet (500 mg total) by mouth 4 (four) times daily -  with meals and at bedtime. 08/30/17  Yes Vaughan Basta, MD  pregabalin (LYRICA) 50 MG capsule 50 mg qhs x 1 week, then 50 mg BID week 2, then 50 mg TID thereafter Patient taking differently: Take 50 mg by mouth 3 (three) times daily. 50 mg qhs x 1 week, then 50 mg BID week 2, then 50 mg TID thereafter 10/31/17  Yes Gillis Santa, MD  PROAIR HFA 108 (90 Base) MCG/ACT inhaler Inhale 2 puffs into the lungs every 6 (six) hours as needed for wheezing or shortness of breath.  02/01/17  Yes [provider]  promethazine (PHENERGAN) 25 MG suppository Place 1 suppository (25 mg total) rectally every 6 (six) hours as needed for nausea or vomiting. 04/11/17  Yes Corcoran, Drue Second, MD  senna-docusate (SENOKOT-S) 8.6-50 MG tablet Take 1 tablet by mouth at bedtime as needed for mild constipation. 08/30/17  Yes Vaughan Basta, MD  sodium chloride 1 g tablet Take 1 tablet (1 g total) by mouth 2 (two) times daily with a meal. 11/07/17  Yes Karen Kitchens, NP  STIOLTO RESPIMAT 2.5-2.5 MCG/ACT AERS Inhale 2 puffs into the lungs daily.  02/04/17  Yes [provider]  sucralfate (CARAFATE) 1 g tablet Take 1 tablet (1 g total) by mouth 3 (three) times daily. 10/03/17  Yes Karen Kitchens, NP  thiamine 100 MG tablet Take 1 tablet (100 mg total) by mouth daily. 08/31/17  Yes Vaughan Basta, MD  tiZANidine (ZANAFLEX) 4 MG tablet Take 1 tablet (4 mg total) by mouth 2 (two) times daily as needed for muscle spasms. 12/11/17 01/10/18 Yes  Gillis Santa, MD  vitamin C (VITAMIN C) 500 MG tablet Take 1 tablet (500 mg total) by mouth 2 (two) times daily. 08/30/17  Yes Vaughan Basta, MD  Vitamin D, Ergocalciferol, (DRISDOL) 50000 units CAPS capsule Take 50,000 Units by mouth every 7 (seven) days.  03/11/17  Yes [provider]  oxyCODONE (OXY IR/ROXICODONE) 5 MG immediate release tablet Take 1 tablet (5 mg total) by mouth 3 (three) times daily as needed for severe pain.  Patient not taking: Reported on 12/23/2017 01/10/18 02/09/18  Gillis Santa, MD      PHYSICAL EXAMINATION:   VITAL SIGNS: Blood pressure 106/72, pulse 92, temperature 98.3 F (36.8 C), temperature source Oral, resp. rate (!) 22, height 5\' 11"  (1.803 m), weight 52 kg, SpO2 100 %.  GENERAL:  57 y.o.-year-old patient lying in the bed with no acute distress.  EYES: Pupils equal, round, reactive to light and accommodation. No scleral icterus. Extraocular muscles intact.  HEENT: Head atraumatic, normocephalic. Oropharynx dry and nasopharynx clear.  NECK:  Supple, no jugular venous distention. No thyroid enlargement, no tenderness.  LUNGS: Normal breath sounds bilaterally, no wheezing, rales,rhonchi or crepitation. No use of accessory muscles of respiration.  CARDIOVASCULAR: S1, S2 normal. No murmurs, rubs, or gallops.  ABDOMEN: Soft, nontender, nondistended. Bowel sounds present. No organomegaly or mass.  EXTREMITIES: No pedal edema, cyanosis, or clubbing.  NEUROLOGIC: Cranial nerves II through XII are intact. Muscle strength 5/5 in all extremities. Sensation intact. Gait not checked.  PSYCHIATRIC: The patient is alert and oriented x 3.  SKIN: No obvious rash, lesion, or ulcer.   LABORATORY PANEL:   CBC Recent Labs  Lab 12/23/17 1357  WBC 6.5  HGB 11.2*  HCT 31.9*  PLT 204  MCV 100.2*  MCH 35.2*  MCHC 35.2  RDW 14.4    ------------------------------------------------------------------------------------------------------------------  Chemistries  Recent Labs  Lab 12/23/17 1357  NA 139  K 3.2*  CL 107  CO2 20*  GLUCOSE 110*  BUN 11  CREATININE 0.95  CALCIUM 7.6*  AST 25  ALT 10  ALKPHOS 94  BILITOT 1.6*   ------------------------------------------------------------------------------------------------------------------ estimated creatinine clearance is 63.1 mL/min (by C-G formula based on SCr of 0.95 mg/dL). ------------------------------------------------------------------------------------------------------------------ No results for input(s): TSH, T4TOTAL, T3FREE, THYROIDAB in the last 72 hours.  Invalid input(s): FREET3   Coagulation profile No results for input(s): INR, PROTIME in the last 168 hours. ------------------------------------------------------------------------------------------------------------------- No results for input(s): DDIMER in the last 72 hours. -------------------------------------------------------------------------------------------------------------------  Cardiac Enzymes Recent Labs  Lab 12/23/17 1357  TROPONINI <0.03   ------------------------------------------------------------------------------------------------------------------ Invalid input(s): POCBNP  ---------------------------------------------------------------------------------------------------------------  Urinalysis    Component Value Date/Time   COLORURINE YELLOW (A) 08/27/2017 0514   APPEARANCEUR CLEAR (A) 08/27/2017 0514   LABSPEC 1.010 08/27/2017 0514   PHURINE 5.0 08/27/2017 0514   GLUCOSEU NEGATIVE 08/27/2017 0514   HGBUR NEGATIVE 08/27/2017 0514   BILIRUBINUR NEGATIVE 08/27/2017 0514   KETONESUR 20 (A) 08/27/2017 0514   PROTEINUR NEGATIVE 08/27/2017 0514   UROBILINOGEN 0.2 08/25/2014 0043   NITRITE NEGATIVE 08/27/2017 0514   LEUKOCYTESUR NEGATIVE 08/27/2017 0514      RADIOLOGY: Ct Head Wo Contrast  Result Date: 12/23/2017 CLINICAL DATA:  Altered mental status EXAM: CT HEAD WITHOUT CONTRAST TECHNIQUE: Contiguous axial images were obtained from the base of the skull through the vertex without intravenous contrast. COMPARISON:  10/16/2017 and 06/21/2017 FINDINGS: Brain: Global atrophy. Extensive low-density in the periventricular white matter has progressed. There is no mass effect, midline shift, or acute hemorrhage. There is a triangular calcification in the periventricular white matter of the right temporal lobe. This is new. See image 49 of series 4. It is of unknown significance. Vascular: No hyperdense vessel or unexpected calcification. Skull: The cranium is intact. Sinuses/Orbits: Mastoid air cells clear. Small amount of mucoid material in the posterior left ethmoid air cell. The visualized paranasal sinuses are otherwise clear. Visualized orbits are within normal limits. Other: Noncontributory. IMPRESSION: Compared to the prior study, diffuse white matter disease has worsened. There is  also a small calcification within the white matter of the right temporal lobe. Although these findings may represent progressive chronic ischemic change, given changes this year, this may be related to radiation or chemotherapy. No acute hemorrhage or mass effect. Electronically Signed   By: Marybelle Killings M.D.   On: 12/23/2017 15:03   Dg Chest Portable 1 View  Result Date: 12/23/2017 CLINICAL DATA:  Stage IV lung cancer with bone metastases. New onset altered mental status. EXAM: PORTABLE CHEST 1 VIEW COMPARISON:  Chest radiograph 08/26/2017. FINDINGS: There is a right chest wall port a catheter identified with tip at the cavoatrial junction. Heart size appears normal. No pleural effusion or edema. No airspace opacities identified. IMPRESSION: No acute cardiopulmonary abnormalities identified. Electronically Signed   By: Kerby Moors M.D.   On: 12/23/2017 14:32     EKG: Orders placed or performed in visit on 12/09/17  . EKG 12-Lead    IMPRESSION AND PLAN:  57 year old male patient with history of lung cancer metastatic to bone, peripheral arterial disease, DVT, coronary artery disease, hyperlipidemia, hypertension, tobacco abuse presented to the emergency room for confusion  -Acute metabolic encephalopathy Rule out infectious etiology Check urinalysis IV fluids Correct electrolytes  -Acute hypokalemia Potassium supplementation  -Lung cancer metastatic to bone Supportive care and pain management  -Tobacco abuse Tobacco cessation counseled with the patient Nicotine patch offered  All the records are reviewed and case discussed with ED provider. Management plans discussed with the patient, family and they are in agreement.  CODE STATUS:Full code Code Status History    Date Active Date Inactive Code Status Order ID Comments User Context   08/27/2017 0134 08/30/2017 1519 Full Code [id number]  Arta Silence, MD Inpatient   11/15/2016 2101 11/18/2016 1748 Full Code [id number]  Roseanne Kaufman, MD Inpatient   07/26/2016 1455 07/27/2016 2233 Full Code [id number]  Demetrios Loll, MD Inpatient   03/30/2015 1238 03/30/2015 2101 Full Code [id number]  Wellington Hampshire, MD Inpatient   08/25/2014 1309 08/27/2014 1147 Full Code [id number]  Sara Chu Inpatient   08/24/2014 1339 08/24/2014 2042 Full Code [id number]  Isaias Cowman, MD Inpatient       TOTAL TIME TAKING CARE OF THIS PATIENT: 53 minutes.    Saundra Shelling M.D on 12/23/2017 at 4:24 PM  Between 7am to 6pm - Pager - [phone]  After 6pm go to [email or web address] - password [email or web address] [facility]  [facility]  Office  [phone]  CC: Primary care physician; Lorelee Market, MD

## 2017-12-23 NOTE — ED Notes (Signed)
Pt is less restless and more cooperative at this time. Wife and son at bedside.

## 2017-12-23 NOTE — ED Triage Notes (Signed)
Pt arrives via [facility] from home for AMS yesterday pt is not at baseline. Pt is dilarious irrationally angry Stage 4 brain and bone cancer. 3 days since fluid or food. Pt was seen here d/c on 12/11/17  Temp 98.3 28 86%  58 CBG  12.5 dextrose up to 158  500 bolus of fluid

## 2017-12-23 NOTE — ED Provider Notes (Signed)
[facility] Emergency Department Provider Note  Time seen: 2:11 PM  I have reviewed the triage vital signs and the nursing notes.   HISTORY  Chief Complaint Altered Mental Status    HPI Philip [name] is a 57 y.o. male with a past medical history of anemia, CAD status post stents, DVT, GI bleed, hypertension, hyperlipidemia lung cancer now stage IV status post brain metastases, presents to the emergency department for altered mental status.  According to report the patient arrives from home for altered mental status beginning yesterday.  Patient per report is acting aggressive and angry very confused.  Patient was recently discharged from the hospital less than 1 week ago following an admission for pneumonia per report.  Here the patient is awake and alert.  Is unable to tell me where he is what year it is or why he is here.  Denies having a family however the family is on their way per EMS.   Past Medical History:  Diagnosis Date  . Anemia   . CAD (coronary artery disease)    a. 08/2014 Inf STEMI/CABG x 3 (LIMA->LAD, VG->Diag, RIMA->RCA).  . DVT, recurrent, lower extremity, acute, left ([place]) 2016  . GIB (gastrointestinal bleeding)    a. In the setting of DAPT-->tolerating plavix only.  . Hyperlipidemia   . Hypertensive heart disease   . Lung cancer ([place])    a. s/p chemo/radiation.  Marland Kitchen PAD (peripheral artery disease) ([place])    a. 03/2015 Periph Angio: short occlusion of L pop w/ evidence of embolization into the DP->5.0x50 mm Inova self-expanding stent;  b. 04/08/2015 ABI: R 1.12, L 0.99; c. 01/2017 LE Duplex: bilat SFA obstructive dzs, patent L pop.  . Tobacco abuse   . Toe amputation status, left [facility]) 07/09/2016   2017    Patient Active Problem List   Diagnosis Date Noted  . Left upper lobe pulmonary nodule 12/08/2017  . Enteropathogenic Escherichia coli infection 12/02/2017  . Metastasis to brain ([place]) 10/18/2017  . White matter disease 10/18/2017   . Folate deficiency 09/10/2017  . Adrenal insufficiency ([place]) 09/02/2017  . Intractable nausea and vomiting 08/27/2017  . Protein-calorie malnutrition, severe 08/27/2017  . Weakness 08/02/2017  . Lung cancer metastatic to bone ([place]) 04/24/2017  . 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 Marijuana user 04/24/2017  . Abnormal drug screen 04/24/2017  . Skin abscess 04/17/2017  . Non-intractable vomiting with nausea 01/20/2017  . Hypokalemia 01/20/2017  . Dehydration 01/20/2017  . Abscess of left hand 11/13/2016  . Chronic thoracic spine pain 10/28/2016  . Antineoplastic chemotherapy induced anemia 10/28/2016  . Encounter for antineoplastic chemotherapy 09/10/2016  . Charcot's joint of foot (Right) 08/21/2016  . GERD (gastroesophageal reflux disease) 08/21/2016  . Myocardial infarction ([place]) 08/21/2016  . Hyponatremia 08/19/2016  . Hypocalcemia 08/19/2016  . Hypomagnesemia 08/19/2016  . Routine history and physical examination of adult 08/09/2016  . Cancer of upper lobe of left lung ([place]) 07/31/2016  . Cancer related pain 07/31/2016  . Cough 07/31/2016  . Goals of care, counseling/discussion 07/26/2016  . Acute CVA (cerebrovascular accident) ([place]) 07/26/2016  . Small cell lung cancer ([place]) 07/19/2016  . Postobstructive pneumonia 07/17/2016  . Bone metastasis ([place]) 07/12/2016  . Lymphadenopathy, mediastinal 07/02/2016  . Weight loss 07/02/2016  . Night sweats 07/02/2016  . Chronic foot pain (Left) 03/21/2016  . Chronic pain syndrome 03/21/2016  . Neuropathic pain 03/21/2016  . Iron deficiency anemia 01/08/2016  . Dry gangrene ([place]) 01/02/2016  . Osteopenia of both  feet 12/10/2015  . Peripheral neuropathy 06/12/2015  . Hyperlipidemia, unspecified   . PAD (peripheral artery disease) ([place]) 03/29/2015  . Hypertension 10/05/2014  . Pure hypercholesterolemia 10/05/2014  . Acute sinusitis, unspecified 09/14/2014  . Rash and other nonspecific skin eruption 09/14/2014  . S/P CABG  x 3 08/25/2014  . Status post aorto-coronary artery bypass graft 08/25/2014  . History of MI (myocardial infarction) 08/24/2014  . CAD (coronary artery disease), native coronary artery 08/24/2014  . Acute myocardial infarction of inferior wall ([place]) 08/24/2014  . Right ankle pain 07/20/2014  . Pain in joint involving ankle and foot 07/20/2014  . Bilateral foot pain 07/06/2014  . Tobacco use disorder 07/06/2014  . Convulsions ([place]) 07/06/2014  . Preventative health care 07/06/2014  . Pain in soft tissues of limb 07/06/2014    Past Surgical History:  Procedure Laterality Date  . CARDIAC CATHETERIZATION N/A 08/24/2014   Procedure: Left Heart Cath and Coronary Angiography;  Surgeon: Isaias Cowman, MD;  Location: [place] CV LAB;  Service: Cardiovascular;  Laterality: N/A;  . CARDIAC CATHETERIZATION N/A 08/24/2014   Procedure: Coronary Stent Intervention;  Surgeon: Isaias Cowman, MD;  Location: [place] CV LAB;  Service: Cardiovascular;  Laterality: N/A;  . CORONARY ARTERY BYPASS GRAFT N/A 08/25/2014   Procedure: CORONARY ARTERY BYPASS GRAFTING (CABG), ON PUMP, TIMES THREE, USING BILATERAL MAMMARY ARTERIES, RIGHT GREATER SAPHENOUS VEIN HARVESTED ENDOSCOPICALLY;  Surgeon: Melrose Nakayama, MD;  Location: [place];  Service: Open Heart Surgery;  Laterality: N/A;  . ESOPHAGOGASTRODUODENOSCOPY (EGD) WITH PROPOFOL N/A 08/08/2017   Procedure: ESOPHAGOGASTRODUODENOSCOPY (EGD) WITH PROPOFOL;  Surgeon: Jonathon Bellows, MD;  Location: [facility] ENDOSCOPY;  Service: Gastroenterology;  Laterality: N/A;  . INCISION AND DRAINAGE ABSCESS Left 11/15/2016   Procedure: INCISION AND DRAINAGE ABSCESS OF LEFT WRIST;  Surgeon: Roseanne Kaufman, MD;  Location: [place];  Service: Orthopedics;  Laterality: Left;  . PEG PLACEMENT N/A 10/01/2017   Procedure: PERCUTANEOUS ENDOSCOPIC GASTROSTOMY (PEG) PLACEMENT;  Surgeon: Lucilla Lame, MD;  Location: [facility] ENDOSCOPY;  Service: Endoscopy;  Laterality: N/A;  . PERIPHERAL  VASCULAR CATHETERIZATION N/A 03/30/2015   Procedure: Abdominal Aortogram w/Lower Extremity;  Surgeon: Wellington Hampshire, MD;  Location: [place] CV LAB;  Service: Cardiovascular;  Laterality: N/A;  . PORTA CATH INSERTION N/A 07/27/2016   Procedure: Glori Luis Cath Insertion;  Surgeon: Algernon Huxley, MD;  Location: [place] CV LAB;  Service: Cardiovascular;  Laterality: N/A;  . TEE WITHOUT CARDIOVERSION N/A 08/25/2014   Procedure: TRANSESOPHAGEAL ECHOCARDIOGRAM (TEE);  Surgeon: Melrose Nakayama, MD;  Location: [place];  Service: Open Heart Surgery;  Laterality: N/A;    Prior to Admission medications   Medication Sig Start Date End Date Taking? Authorizing Provider  atorvastatin (LIPITOR) 40 MG tablet TAKE 1 TABLET BY MOUTH DAILY 10/01/17   Wellington Hampshire, MD  Calcium Carb-Cholecalciferol (CALCIUM + D3 PO) Take 1 tablet by mouth 3 (three) times daily.    [provider]  clopidogrel (PLAVIX) 75 MG tablet Take 1 tablet (75 mg total) by mouth daily. 11/14/16   Wellington Hampshire, MD  diphenhydrAMINE (BENADRYL) 25 mg capsule Take 25 mg by mouth 2 (two) times daily as needed for allergies.     [provider]  famotidine (PEPCID) 20 MG tablet Take 1 tablet (20 mg total) by mouth 2 (two) times daily. 08/30/17   Vaughan Basta, MD  feeding supplement, ENSURE ENLIVE, (ENSURE ENLIVE) LIQD Take 237 mLs by mouth 3 (three) times daily between meals. 08/30/17   Vaughan Basta, MD  FLOVENT HFA 220 MCG/ACT inhaler INL 2 PFS PO BID 02/01/17   [provider]  folic acid (FOLVITE) 1 MG tablet Take 1 tablet (1 mg total) by mouth daily. 08/30/17   Vaughan Basta, MD  hydrochlorothiazide (HYDRODIURIL) 12.5 MG tablet Take 1 tablet by mouth daily. 09/06/17   [provider]  lidocaine-prilocaine (EMLA) cream Apply to affected area once 10/10/17   Karen Kitchens, NP  magnesium oxide (MAG-OX) 400 (241.3 Mg) MG tablet Take 1 tablet (400 mg total) by mouth 3 (three)  times daily. 05/16/17   Karen Kitchens, NP  metoCLOPramide (REGLAN) 5 MG tablet Take 1 tablet (5 mg total) by mouth 4 (four) times daily -  before meals and at bedtime. 08/30/17   Vaughan Basta, MD  metoprolol tartrate (LOPRESSOR) 25 MG tablet TAKE 1 TABLET BY MOUTH TWICE DAILY 12/05/16   Wellington Hampshire, MD  Multiple Vitamin (MULTIVITAMIN WITH MINERALS) TABS tablet Take 1 tablet by mouth daily. 08/31/17   Vaughan Basta, MD  ondansetron (ZOFRAN-ODT) 8 MG disintegrating tablet Take 1 tablet (8 mg total) by mouth every 8 (eight) hours as needed for nausea or vomiting. 08/02/17   Karen Kitchens, NP  oxyCODONE (OXY IR/ROXICODONE) 5 MG immediate release tablet Take 1 tablet (5 mg total) by mouth 3 (three) times daily as needed for severe pain. 12/11/17 01/10/18  Gillis Santa, MD  oxyCODONE (OXY IR/ROXICODONE) 5 MG immediate release tablet Take 1 tablet (5 mg total) by mouth 3 (three) times daily as needed for severe pain. 01/10/18 02/09/18  Gillis Santa, MD  pantoprazole (PROTONIX) 40 MG tablet TK 1 T PO QD 10/01/17   [provider]  potassium phosphate, monobasic, (K-PHOS ORIGINAL) 500 MG tablet Take 1 tablet (500 mg total) by mouth 4 (four) times daily -  with meals and at bedtime. 08/30/17   Vaughan Basta, MD  pregabalin (LYRICA) 50 MG capsule 50 mg qhs x 1 week, then 50 mg BID week 2, then 50 mg TID thereafter 10/31/17   Gillis Santa, MD  PROAIR HFA 108 (90 Base) MCG/ACT inhaler INL 2 PFS PO Q 4 TO 6 H PRN 02/01/17   [provider]  promethazine (PHENERGAN) 25 MG suppository Place 1 suppository (25 mg total) rectally every 6 (six) hours as needed for nausea or vomiting. 04/11/17   Lequita Asal, MD  senna-docusate (SENOKOT-S) 8.6-50 MG tablet Take 1 tablet by mouth at bedtime as needed for mild constipation. 08/30/17   Vaughan Basta, MD  sodium chloride 1 g tablet Take 1 tablet (1 g total) by mouth 2 (two) times daily with a meal. 11/07/17   Karen Kitchens, NP  STIOLTO RESPIMAT 2.5-2.5 MCG/ACT AERS INL 2 PFS PO QD 02/04/17   [provider]  sucralfate (CARAFATE) 1 g tablet Take 1 tablet (1 g total) by mouth 3 (three) times daily. 10/03/17   Karen Kitchens, NP  thiamine 100 MG tablet Take 1 tablet (100 mg total) by mouth daily. 08/31/17   Vaughan Basta, MD  tiZANidine (ZANAFLEX) 4 MG tablet Take 1 tablet (4 mg total) by mouth 2 (two) times daily as needed for muscle spasms. 12/11/17 01/10/18  Gillis Santa, MD  vitamin C (VITAMIN C) 500 MG tablet Take 1 tablet (500 mg total) by mouth 2 (two) times daily. 08/30/17   Vaughan Basta, MD  Vitamin D, Ergocalciferol, (DRISDOL) 50000 units CAPS capsule TK ONE C PO Q WEEK 03/11/17   [provider]    Allergies  Allergen  Reactions  . Iodinated Diagnostic Agents Other (See Comments) and Swelling    Pt states that he got "knots behind his ears" Pt states that he got "knots behind his ears".  . Tylenol [Acetaminophen] Palpitations  . Amitriptyline Other (See Comments)    Burning sensation all over  . Aspirin   . Nsaids Other (See Comments)    Blood in stools  . Ibuprofen Other (See Comments) and Nausea Only    Blood in stools  . Tape Rash and Other (See Comments)    PLEASE USE COBAN WRAP; THE PATIENT'S SKIN TEARS WHEN BANDAGES ARE REMOVED!!    Family History  Problem Relation Age of Onset  . Hypertension Father   . Lung cancer Father   . Heart attack Mother 70       STENT  . Hyperlipidemia Mother   . Heart attack Brother   . Heart attack Brother   . Breast cancer Sister   . Heart attack Sister   . Colon cancer Paternal Grandfather     Social History Social History   Tobacco Use  . Smoking status: Current Every Day Smoker    Packs/day: 0.25    Types: Cigarettes  . Smokeless tobacco: Never Used  . Tobacco comment: 5-6 cigs in a week.   Substance Use Topics  . Alcohol use: Yes    Alcohol/week: 0.0 standard drinks    Comment: Sometimes on w/e.  .  Drug use: No    Review of Systems Unable to obtain an adequate/accurate review of systems secondary to altered mental status ____________________________________________   PHYSICAL EXAM:  VITAL SIGNS: ED Triage Vitals [12/23/17 1337]  Enc Vitals Group     BP      Pulse Rate (!) 111     Resp      Temp 98.3 F (36.8 C)     Temp Source Oral     SpO2 95 %     Weight 114 lb 11.2 oz (52 kg)     Height 5\' 11"  (1.803 m)     Head Circumference      Peak Flow      Pain Score      Pain Loc      Pain Edu?      Excl. in [place]?    Constitutional: Patient is awake and alert but disoriented, appears agitated, wants to roll over on his side while we are attempting to examine and access his port. Eyes: Normal exam ENT   Head: Normocephalic and atraumatic.   Mouth/Throat: Mucous membranes are moist. Cardiovascular: Normal rate, regular rhythm around 100 bpm. Respiratory: Normal respiratory effort without tachypnea nor retractions. Breath sounds are clear Gastrointestinal: Soft and nontender. No distention.   Musculoskeletal: Patient moves all extremities. Neurologic:  Normal appearing speech.  Appears to move all extremities. Skin:  Skin is warm, dry and intact.  Psychiatric: Patient is confused.  ____________________________________________     RADIOLOGY  CT scan shows white matter disease has worsened.  Chest x-ray negative  ____________________________________________   INITIAL IMPRESSION / ASSESSMENT AND PLAN / ED COURSE  Pertinent labs & imaging results that were available during my care of the patient were reviewed by me and considered in my medical decision making (see chart for details).  She presents the emergency department for altered mental status from home.  Differential would include electrolyte/metabolic abnormality, infectious etiology such as urinary tract infection or pneumonia, brain metastases.  We will check labs, IV hydrate, chest x-ray and continue  to closely  monitor.  Patient is agitated, disoriented, awaiting family arrival for further history.  Patient's work-up is largely nonrevealing.  CT scan of the head does show that there is diffuse white matter disease that has continued to worsen.  Patient has known brain metastases, his current condition with confusion and altered renal status could be due to brain metastases.  Chest x-ray is overall clear, labs are overall baseline.  Patient's family is now here.  They state prior to Friday the patient was acting fairly normal, he could converse like normal, was eating and drinking.  Since Friday they state the patient has had a progressive decline.  Has not eaten or drinking anything since Friday per family, is now refusing to take his medications.  States that he has been very confused, trying to drink out of a slipper this morning.  I discussed with family that medical work-up has been thus far nonrevealing, urinalysis is pending.  This could be due to progression of his cancer metastatic disease.  Family is very reasonable and rational.  However given the acute onset of his symptoms I do believe hospital admission is warranted if the hospital admission does not show any medical causes for his current condition and may be time to involve palliative care, and I did discuss this with the family they are agreeable to this plan of care.  ____________________________________________   FINAL CLINICAL IMPRESSION(S) / ED DIAGNOSES  Altered mental status    Harvest Dark, MD 12/23/17 1510

## 2017-12-23 NOTE — ED Notes (Signed)
Pt presents today with extreme agitation and anger. Pt is verbal cursing. Pt will not stay still and is uncooperative with staff.

## 2017-12-23 NOTE — Progress Notes (Signed)
   12/23/17 2005  Clinical Encounter Type  Visited With Patient and family together  Visit Type Initial;Spiritual support  Referral From Nurse  Spiritual Encounters  Spiritual Needs Prayer;Emotional    OR received for a request for a prayer for the patient. Upon arrival to the patient's room, a host of relatives were at the patient's bedside (wife, adult children, nephew and wife, pastor, etc.). Each family member introduced themselves to the chaplain. Patient was awake, but withdrawn. Chaplain talked briefly with the patient who indicated that he was not in need of anything at the time. She also talked with patient's pastor, who asked the chaplain to pray for the family; family requested prayer for peace for the patient's wife, for the patient and his acknowledgment of illness, salvation for the patient, peace for grandchildren who are having a difficult time, etc. Chaplain and family joined hands around the patient and prayer commenced. Family thanked the chaplain for being with them and requested continued prayer in the days and weeks to come. Family will ask the nurse to page or call if necessary.

## 2017-12-24 ENCOUNTER — Observation Stay: Payer: Medicaid Other

## 2017-12-24 DIAGNOSIS — Z7189 Other specified counseling: Secondary | ICD-10-CM

## 2017-12-24 DIAGNOSIS — C7940 Secondary malignant neoplasm of unspecified part of nervous system: Secondary | ICD-10-CM

## 2017-12-24 DIAGNOSIS — Z801 Family history of malignant neoplasm of trachea, bronchus and lung: Secondary | ICD-10-CM

## 2017-12-24 DIAGNOSIS — F17210 Nicotine dependence, cigarettes, uncomplicated: Secondary | ICD-10-CM | POA: Diagnosis not present

## 2017-12-24 DIAGNOSIS — R4182 Altered mental status, unspecified: Secondary | ICD-10-CM | POA: Diagnosis not present

## 2017-12-24 DIAGNOSIS — Z66 Do not resuscitate: Secondary | ICD-10-CM

## 2017-12-24 DIAGNOSIS — Z803 Family history of malignant neoplasm of breast: Secondary | ICD-10-CM

## 2017-12-24 DIAGNOSIS — Z923 Personal history of irradiation: Secondary | ICD-10-CM

## 2017-12-24 DIAGNOSIS — Z515 Encounter for palliative care: Secondary | ICD-10-CM | POA: Diagnosis not present

## 2017-12-24 DIAGNOSIS — C3490 Malignant neoplasm of unspecified part of unspecified bronchus or lung: Secondary | ICD-10-CM | POA: Diagnosis not present

## 2017-12-24 LAB — CBC
HCT: 27.3 % — ABNORMAL LOW (ref 40.0–52.0)
Hemoglobin: 9.5 g/dL — ABNORMAL LOW (ref 13.0–18.0)
MCH: 35 pg — ABNORMAL HIGH (ref 26.0–34.0)
MCHC: 34.9 g/dL (ref 32.0–36.0)
MCV: 100.3 fL — ABNORMAL HIGH (ref 80.0–100.0)
PLATELETS: 161 10*3/uL (ref 150–440)
RBC: 2.72 MIL/uL — ABNORMAL LOW (ref 4.40–5.90)
RDW: 14.9 % — AB (ref 11.5–14.5)
WBC: 5.1 10*3/uL (ref 3.8–10.6)

## 2017-12-24 LAB — BASIC METABOLIC PANEL
Anion gap: 6 (ref 5–15)
BUN: 13 mg/dL (ref 6–20)
CALCIUM: 7.5 mg/dL — AB (ref 8.9–10.3)
CO2: 23 mmol/L (ref 22–32)
Chloride: 112 mmol/L — ABNORMAL HIGH (ref 98–111)
Creatinine, Ser: 0.77 mg/dL (ref 0.61–1.24)
GFR calc Af Amer: >60 mL/min (ref >60)
Glucose, Bld: 75 mg/dL (ref 70–99)
Potassium: 3.7 mmol/L (ref 3.5–5.1)
SODIUM: 141 mmol/L (ref 135–145)

## 2017-12-24 MED ORDER — SODIUM CHLORIDE 0.9 % IV SOLN: 250.0000 mL | INTRAVENOUS | Status: DC | PRN

## 2017-12-24 MED ORDER — MORPHINE SULFATE (PF) 2 MG/ML IV SOLN
1.0000 mg | INTRAVENOUS | Status: DC | PRN
  Administered 2017-12-24 – 2017-12-25 (×5): 1 mg via INTRAVENOUS
  Filled 2017-12-24 (×5): qty 1

## 2017-12-24 MED ORDER — HALOPERIDOL 0.5 MG PO TABS
0.5000 mg | ORAL_TABLET | ORAL | Status: DC | PRN
  Filled 2017-12-24 (×2): qty 1

## 2017-12-24 MED ORDER — OXYCODONE HCL 20 MG/ML PO CONC: 5.0000 mg | ORAL | Status: DC | PRN

## 2017-12-24 MED ORDER — OXYCODONE HCL 5 MG PO TABS
5.0000 mg | ORAL_TABLET | ORAL | Status: DC | PRN
  Administered 2017-12-24 (×2): 10 mg via ORAL
  Filled 2017-12-24 (×2): qty 2

## 2017-12-24 MED ORDER — POLYVINYL ALCOHOL 1.4 % OP SOLN
1.0000 [drp] | Freq: Four times a day (QID) | OPHTHALMIC | Status: DC | PRN
  Filled 2017-12-24: qty 15

## 2017-12-24 MED ORDER — ONDANSETRON 4 MG PO TBDP: 4.0000 mg | ORAL_TABLET | Freq: Four times a day (QID) | ORAL | Status: DC | PRN

## 2017-12-24 MED ORDER — GLYCOPYRROLATE 0.2 MG/ML IJ SOLN
0.2000 mg | INTRAMUSCULAR | Status: DC | PRN
  Filled 2017-12-24: qty 1

## 2017-12-24 MED ORDER — HALOPERIDOL LACTATE 5 MG/ML IJ SOLN
0.5000 mg | INTRAMUSCULAR | Status: DC | PRN
  Administered 2017-12-25: 0.5 mg via INTRAVENOUS
  Filled 2017-12-24 (×3): qty 0.1

## 2017-12-24 MED ORDER — SODIUM CHLORIDE 0.9% FLUSH
3.0000 mL | Freq: Two times a day (BID) | INTRAVENOUS | Status: DC
  Administered 2017-12-24 – 2017-12-26 (×3): 3 mL via INTRAVENOUS

## 2017-12-24 MED ORDER — ONDANSETRON HCL 4 MG/2ML IJ SOLN: 4.0000 mg | Freq: Four times a day (QID) | INTRAMUSCULAR | Status: DC | PRN

## 2017-12-24 MED ORDER — SODIUM CHLORIDE 0.9% FLUSH: 3.0000 mL | INTRAVENOUS | Status: DC | PRN

## 2017-12-24 MED ORDER — ACETAMINOPHEN 650 MG RE SUPP: 650.0000 mg | Freq: Four times a day (QID) | RECTAL | Status: DC | PRN

## 2017-12-24 MED ORDER — GLYCOPYRROLATE 1 MG PO TABS
1.0000 mg | ORAL_TABLET | ORAL | Status: DC | PRN
  Filled 2017-12-24: qty 1

## 2017-12-24 MED ORDER — BIOTENE DRY MOUTH MT LIQD: 15.0000 mL | OROMUCOSAL | Status: DC | PRN

## 2017-12-24 MED ORDER — ACETAMINOPHEN 325 MG PO TABS: 650.0000 mg | ORAL_TABLET | Freq: Four times a day (QID) | ORAL | Status: DC | PRN

## 2017-12-24 MED ORDER — HALOPERIDOL LACTATE 2 MG/ML PO CONC
0.5000 mg | ORAL | Status: DC | PRN
  Filled 2017-12-24: qty 0.3

## 2017-12-24 MED ORDER — LORAZEPAM 2 MG/ML IJ SOLN
1.0000 mg | Freq: Once | INTRAMUSCULAR | Status: AC
  Administered 2017-12-24: 1 mg via INTRAVENOUS
  Filled 2017-12-24: qty 1

## 2017-12-24 MED ORDER — GADOBENATE DIMEGLUMINE 529 MG/ML IV SOLN
10.0000 mL | Freq: Once | INTRAVENOUS | Status: AC | PRN
  Administered 2017-12-24: 10 mL via INTRAVENOUS

## 2017-12-24 NOTE — Progress Notes (Addendum)
New hospice home referral received from [facility] following a Palliative Care consult.  Writer spoke with Mrs. Kiper and plan is to meet in the morning to discuss hospice services, with planned transfer to the hospice home tomorrow afternoon. Patient information faxed to referral. Thank you. Flo Shanks RN, BSN, [place] [facility], hospital Liaison [phone]

## 2017-12-24 NOTE — Clinical Social Work Note (Signed)
Clinical Social Work Assessment  Patient Details  Name: Philip [name] MRN: [id number] Date of Birth: 1960-06-26  Date of referral:  12/24/17               Reason for consult:  End of Life/Hospice                Permission sought to share information with:    Permission granted to share information::     Name::        Agency::     Relationship::     Contact Information:     Housing/Transportation Living arrangements for the past 2 months:  Single Family Home Source of Information:  Spouse Patient Interpreter Needed:  None Criminal Activity/Legal Involvement Pertinent to Current Situation/Hospitalization:  No - Comment as needed Significant Relationships:  Adult Children, Spouse Lives with:  Spouse Do you feel safe going back to the place where you live?    Need for family participation in patient care:  Yes (Comment)  Care giving concerns:  Patient has been residing with his spouse at home.    Social Worker assessment / plan:  CSW informed by Palliative that patient's wife has chosen hospice home. CSW spoke with patient's wife at bedside and she confirmed she is in agreement with hospice home and that they prefer hosp[facility] [place]. CSW made referral to Santiago Glad with [facility] of [place].  Employment status:    Insurance information:    PT Recommendations:    Information / Referral to community resources:     Patient/Family's Response to care:  Patient's wife expressed appreciation for CSW assistance.  Patient/Family's Understanding of and Emotional Response to Diagnosis, Current Treatment, and Prognosis:  Patient's wife was tearful at patient's bedside. CSW offered support.  Emotional Assessment Appearance:  Appears stated age Attitude/Demeanor/Rapport:    Affect (typically observed):    Orientation:    Alcohol / Substance use:    Psych involvement (Current and /or in the community):     Discharge Needs  Concerns to be addressed:    Readmission within the last  30 days:  No Current discharge risk:  None Barriers to Discharge:  No Barriers Identified   Shela Leff, LCSW 12/24/2017, 2:47 PM

## 2017-12-24 NOTE — Progress Notes (Addendum)
[facility] NAME: Philip [name]    MR#:  [id number]  DATE OF BIRTH:  Aug 17, 1960  SUBJECTIVE:  CHIEF COMPLAINT:   Chief Complaint  Patient presents with  . Altered Mental Status   Drowzy after ativan given for MRI Agitated prior to that.  Family at bedside  REVIEW OF SYSTEMS:    Review of Systems  Unable to perform ROS: Mental status change    DRUG ALLERGIES:   Allergies  Allergen Reactions  . Iodinated Diagnostic Agents Other (See Comments) and Swelling    Pt states that he got "knots behind his ears" Pt states that he got "knots behind his ears".  . Tylenol [Acetaminophen] Palpitations  . Amitriptyline Other (See Comments)    Burning sensation all over  . Aspirin   . Nsaids Other (See Comments)    Blood in stools  . Ibuprofen Other (See Comments) and Nausea Only    Blood in stools  . Tape Rash and Other (See Comments)    PLEASE USE COBAN WRAP; THE PATIENT'S SKIN TEARS WHEN BANDAGES ARE REMOVED!!    VITALS:  Blood pressure 101/82, pulse (!) 146, temperature 98.7 F (37.1 C), temperature source Oral, resp. rate 19, height 5\' 11"  (1.803 m), weight 52 kg, SpO2 100 %.  PHYSICAL EXAMINATION:   Physical Exam  GENERAL:  57 y.o.-year-old patient lying in the bed with no acute distress.  EYES: Pupils equal, round, reactive to light and accommodation. No scleral icterus. Extraocular muscles intact.  HEENT: Head atraumatic, normocephalic. Oropharynx and nasopharynx clear.  NECK:  Supple, no jugular venous distention. No thyroid enlargement, no tenderness.  LUNGS: Normal breath sounds bilaterally, no wheezing, rales, rhonchi. No use of accessory muscles of respiration.  CARDIOVASCULAR: S1, S2 normal. No murmurs, rubs, or gallops.  ABDOMEN: Soft, nontender, nondistended. Bowel sounds present. No organomegaly or mass.  EXTREMITIES: No cyanosis, clubbing or edema b/l.    NEUROLOGIC: Not following instructions. No  rigidity PSYCHIATRIC: The patient is drowzy SKIN: No obvious rash, lesion, or ulcer.   LABORATORY PANEL:   CBC Recent Labs  Lab 12/24/17 0447  WBC 5.1  HGB 9.5*  HCT 27.3*  PLT 161   ------------------------------------------------------------------------------------------------------------------ Chemistries  Recent Labs  Lab 12/23/17 1357 12/24/17 0447  NA 139 141  K 3.2* 3.7  CL 107 112*  CO2 20* 23  GLUCOSE 110* 75  BUN 11 13  CREATININE 0.95 0.77  CALCIUM 7.6* 7.5*  AST 25  --   ALT 10  --   ALKPHOS 94  --   BILITOT 1.6*  --    ------------------------------------------------------------------------------------------------------------------  Cardiac Enzymes Recent Labs  Lab 12/23/17 1357  TROPONINI <0.03   ------------------------------------------------------------------------------------------------------------------  RADIOLOGY:  Ct Head Wo Contrast  Result Date: 12/23/2017 CLINICAL DATA:  Altered mental status EXAM: CT HEAD WITHOUT CONTRAST TECHNIQUE: Contiguous axial images were obtained from the base of the skull through the vertex without intravenous contrast. COMPARISON:  10/16/2017 and 06/21/2017 FINDINGS: Brain: Global atrophy. Extensive low-density in the periventricular white matter has progressed. There is no mass effect, midline shift, or acute hemorrhage. There is a triangular calcification in the periventricular white matter of the right temporal lobe. This is new. See image 49 of series 4. It is of unknown significance. Vascular: No hyperdense vessel or unexpected calcification. Skull: The cranium is intact. Sinuses/Orbits: Mastoid air cells clear. Small amount of mucoid material in the posterior left ethmoid air cell. The visualized paranasal sinuses are otherwise clear. Visualized orbits  are within normal limits. Other: Noncontributory. IMPRESSION: Compared to the prior study, diffuse white matter disease has worsened. There is also a small  calcification within the white matter of the right temporal lobe. Although these findings may represent progressive chronic ischemic change, given changes this year, this may be related to radiation or chemotherapy. No acute hemorrhage or mass effect. Electronically Signed   By: Marybelle Killings M.D.   On: 12/23/2017 15:03   Mr Jeri Cos RJ Contrast  Result Date: 12/24/2017 CLINICAL DATA:  Acute confusion. History of lung cancer with solitary brain metastasis. EXAM: MRI HEAD WITHOUT AND WITH CONTRAST TECHNIQUE: Multiplanar, multiecho pulse sequences of the brain and surrounding structures were obtained without and with intravenous contrast. CONTRAST:  48mL MULTIHANCE GADOBENATE DIMEGLUMINE 529 MG/ML IV SOLN COMPARISON:  Head CT 12/23/2017 and MRI 10/16/2017 FINDINGS: The study is motion degraded throughout. Brain: There is no evidence of acute infarct, intracranial hemorrhage, midline shift, or extra-axial fluid collection. Extensive, confluent cerebral white matter T2 hyperintensity has slightly progressed from the prior MRI. Patchy pontine T2 hyperintensity is unchanged. 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 Postcontrast T1 weighted imaging is severely motion degraded and essentially nondiagnostic. Assessment for residual enhancement of the previously seen right frontal metastasis is precluded by the degree of motion artifact. This lesion is no longer clearly evident on FLAIR imaging. No new mass or definite vasogenic edema is evident on noncontrast sequences. Vascular: Major intracranial vascular flow voids are preserved. Skull and upper cervical spine: No gross marrow lesion. Sinuses/Orbits: Unremarkable orbits. Mild posterior ethmoid air cell mucosal thickening bilaterally. Bilateral mastoid effusions, enlarged from the prior MRI and likely related radiation therapy. Other: None. IMPRESSION: 1. Motion degraded examination with nondiagnostic post-contrast imaging. This severely  limits assessment for metastatic disease. The small right frontal metastasis on the prior MRI is no longer readily apparent on the noncontrast sequences, and no new mass is evident. 2. No acute infarct. 3. Mild progression of extensive chronic white matter disease which may reflect in part post treatment changes. Electronically Signed   By: Logan Bores M.D.   On: 12/24/2017 10:15   Dg Chest Portable 1 View  Result Date: 12/23/2017 CLINICAL DATA:  Stage IV lung cancer with bone metastases. New onset altered mental status. EXAM: PORTABLE CHEST 1 VIEW COMPARISON:  Chest radiograph 08/26/2017. FINDINGS: There is a right chest wall port a catheter identified with tip at the cavoatrial junction. Heart size appears normal. No pleural effusion or edema. No airspace opacities identified. IMPRESSION: No acute cardiopulmonary abnormalities identified. Electronically Signed   By: Kerby Moors M.D.   On: 12/23/2017 14:32   ASSESSMENT AND PLAN:   57 year old male patient with history of lung cancer metastatic to bone, peripheral arterial disease, DVT, coronary artery disease, hyperlipidemia, hypertension, tobacco abuse presented to the emergency room for confusion  -Acute metabolic encephalopathy. Etiology unclear but could be due to brain mets/radiation No signs of infection Repeat MRI with poor pictures due to patient moving Will request Neurology and oncology input Ordered EEG  -Acute hypokalemia Potassium supplemented  -Lung cancer metastatic to bone Oncology to see  - Severe protein calorie malnutrition  All the records are reviewed and case discussed with Care Management/Social Worker Management plans discussed with the patient, family and they are in agreement.  CODE STATUS: FULL CODE  DVT Prophylaxis: SCDs  TOTAL TIME TAKING CARE OF THIS PATIENT: 30 minutes.   POSSIBLE D/C IN 2-3 DAYS, DEPENDING ON CLINICAL CONDITION.  Philip [name]  R Kewanna Kasprzak M.D on 12/24/2017 at 12:12 PM  Between 7am to  6pm - Pager - [phone]  After 6pm go to [email or web address] - password [email or web address] [facility]  Office  [phone]  CC: Primary care physician; Lorelee Market, MD  Note: This dictation was prepared with Dragon dictation along with smaller phrase technology. Any transcriptional errors that result from this process are unintentional.

## 2017-12-24 NOTE — Consult Note (Signed)
Philip [name]  Date of admission:  12/23/2017  Inpatient day:  12/24/2017  Consulting physician: Dr. Hillary Bow.  Reason for Consultation:  Lung cancer with brain mets. Worsening mental status.  Hospice?  Chief Complaint: Philip [name] is a 57 y.o. male with extensive stage small cell ung cancer who was admitted with altered mental status.  HPI:  The patient was diagnosed with extensive stage small cell lung cancer in 07/2016.  He received 4 cycles of carboplatin and etoposide.  He had an excellent response to therapy.  He received palliative thoracic spine radiation followed by prophylactic cranial radiation (completed 02/2017).  He has struggled with weight loss.  PET scan in 10/2017 revealed anew small nodular area in the LUL.  Head MRI revealed an isolated brain metastasis.  He completed CNS radiation on 11/04/2017.  He developed enteropathogenic E coli (EPEC) in mid 11/2017 and received a course of azithromycin.  Chest CT on 12/10/2017 revealed new patchy peribronchovascular consolidation in the RUL c/w pneumonia.  He was treated with Levaquin.  He was in his usual state of health until Friday or Saturday when he stopped eating, drinking, and taking his medications.  He developed bizarre behavior (drinking out of a slipper and smoking a TV remote).  He stopped smoking.  He has become increasing more withdrawn.  Head MRI was degraded by motion artifact.  There was increased chronic white matter disease.  There were no gross lesions.  CXR was negative.  Chemistries were unremarkable.  He has been seen by neurology.  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 Multiple discussions were held with the family and the hospitalist, palliative care, and neurology.  Family has decided on comfort care.  No further interventions or testing.  No artificial/tube feeding.   Past Medical History:  Diagnosis Date  . Anemia   . CAD (coronary artery disease)    a.  08/2014 Inf STEMI/CABG x 3 (LIMA->LAD, VG->Diag, RIMA->RCA).  . DVT, recurrent, lower extremity, acute, left ([place]) 2016  . GIB (gastrointestinal bleeding)    a. In the setting of DAPT-->tolerating plavix only.  . Hyperlipidemia   . Hypertensive heart disease   . Lung cancer ([place])    a. s/p chemo/radiation.  Marland Kitchen PAD (peripheral artery disease) ([place])    a. 03/2015 Periph Angio: short occlusion of L pop w/ evidence of embolization into the DP->5.0x50 mm Inova self-expanding stent;  b. 04/08/2015 ABI: R 1.12, L 0.99; c. 01/2017 LE Duplex: bilat SFA obstructive dzs, patent L pop.  . Tobacco abuse   . Toe amputation status, left [name]) 07/09/2016   2017    Past Surgical History:  Procedure Laterality Date  . CARDIAC CATHETERIZATION N/A 08/24/2014   Procedure: Left Heart Cath and Coronary Angiography;  Surgeon: Isaias Cowman, MD;  Location: [place] [facility] LAB;  Service: Cardiovascular;  Laterality: N/A;  . CARDIAC CATHETERIZATION N/A 08/24/2014   Procedure: Coronary Stent Intervention;  Surgeon: Isaias Cowman, MD;  Location: [place] CV LAB;  Service: Cardiovascular;  Laterality: N/A;  . CORONARY ARTERY BYPASS GRAFT N/A 08/25/2014   Procedure: CORONARY ARTERY BYPASS GRAFTING (CABG), ON PUMP, TIMES THREE, USING BILATERAL MAMMARY ARTERIES, RIGHT GREATER SAPHENOUS VEIN HARVESTED ENDOSCOPICALLY;  Surgeon: Melrose Nakayama, MD;  Location: [place];  Service: Open Heart Surgery;  Laterality: N/A;  . ESOPHAGOGASTRODUODENOSCOPY (EGD) WITH PROPOFOL N/A 08/08/2017   Procedure: ESOPHAGOGASTRODUODENOSCOPY (EGD) WITH PROPOFOL;  Surgeon: Jonathon Bellows, MD;  Location[facility] ENDOSCOPY;  Service: Gastroenterology;  Laterality: N/A;  . INCISION AND  DRAINAGE ABSCESS Left 11/15/2016   Procedure: INCISION AND DRAINAGE ABSCESS OF LEFT WRIST;  Surgeon: Roseanne Kaufman, MD;  Location: [place];  Service: Orthopedics;  Laterality: Left;  . PEG PLACEMENT N/A 10/01/2017   Procedure: PERCUTANEOUS ENDOSCOPIC GASTROSTOMY  (PEG) PLACEMENT;  Surgeon: Lucilla Lame, MD;  Location: [facility] ENDOSCOPY;  Service: Endoscopy;  Laterality: N/A;  . PERIPHERAL VASCULAR CATHETERIZATION N/A 03/30/2015   Procedure: Abdominal Aortogram w/Lower Extremity;  Surgeon: Wellington Hampshire, MD;  Location: [place] CV LAB;  Service: Cardiovascular;  Laterality: N/A;  . PORTA CATH INSERTION N/A 07/27/2016   Procedure: Glori Luis Cath Insertion;  Surgeon: Algernon Huxley, MD;  Location: [place] CV LAB;  Service: Cardiovascular;  Laterality: N/A;  . TEE WITHOUT CARDIOVERSION N/A 08/25/2014   Procedure: TRANSESOPHAGEAL ECHOCARDIOGRAM (TEE);  Surgeon: Melrose Nakayama, MD;  Location: [place];  Service: Open Heart Surgery;  Laterality: N/A;    Family History  Problem Relation Age of Onset  . Hypertension Father   . Lung cancer Father   . Heart attack Mother 36       STENT  . Hyperlipidemia Mother   . Heart attack Brother   . Heart attack Brother   . Breast cancer Sister   . Heart attack Sister   . Colon cancer Paternal Grandfather     Social History:  reports that he has been smoking cigarettes. He has been smoking about 0.25 packs per day. He has never used smokeless tobacco. He reports that he drinks alcohol. He reports that he does not use drugs.  The patient is accompaned by at least 6 family members including his wife, mother, sister, brother, and daughter.  His wife and daughter express the opinions of the group.  Allergies:  Allergies  Allergen Reactions  . Iodinated Diagnostic Agents Other (See Comments) and Swelling    Pt states that he got "knots behind his ears" Pt states that he got "knots behind his ears".  . Tylenol [Acetaminophen] Palpitations  . Amitriptyline Other (See Comments)    Burning sensation all over  . Aspirin   . Nsaids Other (See Comments)    Blood in stools  . Ibuprofen Other (See Comments) and Nausea Only    Blood in stools  . Tape Rash and Other (See Comments)    PLEASE USE COBAN WRAP; THE PATIENT'S  SKIN TEARS WHEN BANDAGES ARE REMOVED!!    Medications Prior to Admission  Medication Sig Dispense Refill  . atorvastatin (LIPITOR) 40 MG tablet TAKE 1 TABLET BY MOUTH DAILY 90 tablet 3  . Calcium Carb-Cholecalciferol (CALCIUM + D3 PO) Take 1 tablet by mouth 3 (three) times daily.    . clopidogrel (PLAVIX) 75 MG tablet Take 1 tablet (75 mg total) by mouth daily. 90 tablet 3  . diphenhydrAMINE (BENADRYL) 25 mg capsule Take 25 mg by mouth 2 (two) times daily as needed for allergies.     . famotidine (PEPCID) 20 MG tablet Take 1 tablet (20 mg total) by mouth 2 (two) times daily. 60 tablet 0  . feeding supplement, ENSURE ENLIVE, (ENSURE ENLIVE) LIQD Take 237 mLs by mouth 3 (three) times daily between meals. 237 mL 12  . FLOVENT HFA 220 MCG/ACT inhaler Inhale 2 puffs into the lungs 2 (two) times daily.   5  . folic acid (FOLVITE) 1 MG tablet Take 1 tablet (1 mg total) by mouth daily. 30 tablet 0  . hydrochlorothiazide (HYDRODIURIL) 12.5 MG tablet Take 1 tablet by mouth daily.  2  . lidocaine-prilocaine (EMLA)  cream Apply to affected area once 30 g 3  . magnesium oxide (MAG-OX) 400 (241.3 Mg) MG tablet Take 1 tablet (400 mg total) by mouth 3 (three) times daily. 90 tablet 3  . metoCLOPramide (REGLAN) 5 MG tablet Take 1 tablet (5 mg total) by mouth 4 (four) times daily -  before meals and at bedtime. 100 tablet 0  . metoprolol tartrate (LOPRESSOR) 25 MG tablet TAKE 1 TABLET BY MOUTH TWICE DAILY 180 tablet 3  . Multiple Vitamin (MULTIVITAMIN WITH MINERALS) TABS tablet Take 1 tablet by mouth daily. 30 tablet 1  . ondansetron (ZOFRAN-ODT) 8 MG disintegrating tablet Take 1 tablet (8 mg total) by mouth every 8 (eight) hours as needed for nausea or vomiting. 30 tablet 1  . oxyCODONE (OXY IR/ROXICODONE) 5 MG immediate release tablet Take 1 tablet (5 mg total) by mouth 3 (three) times daily as needed for severe pain. 90 tablet 0  . pantoprazole (PROTONIX) 40 MG tablet Take 40 mg by mouth daily.   5  .  potassium phosphate, monobasic, (K-PHOS ORIGINAL) 500 MG tablet Take 1 tablet (500 mg total) by mouth 4 (four) times daily -  with meals and at bedtime. 10 tablet 0  . pregabalin (LYRICA) 50 MG capsule 50 mg qhs x 1 week, then 50 mg BID week 2, then 50 mg TID thereafter (Patient taking differently: Take 50 mg by mouth 3 (three) times daily. 50 mg qhs x 1 week, then 50 mg BID week 2, then 50 mg TID thereafter) 90 capsule 1  . PROAIR HFA 108 (90 Base) MCG/ACT inhaler Inhale 2 puffs into the lungs every 6 (six) hours as needed for wheezing or shortness of breath.   5  . promethazine (PHENERGAN) 25 MG suppository Place 1 suppository (25 mg total) rectally every 6 (six) hours as needed for nausea or vomiting. 12 each 5  . senna-docusate (SENOKOT-S) 8.6-50 MG tablet Take 1 tablet by mouth at bedtime as needed for mild constipation. 30 tablet 0  . sodium chloride 1 g tablet Take 1 tablet (1 g total) by mouth 2 (two) times daily with a meal. 60 tablet 0  . STIOLTO RESPIMAT 2.5-2.5 MCG/ACT AERS Inhale 2 puffs into the lungs daily.   5  . sucralfate (CARAFATE) 1 g tablet Take 1 tablet (1 g total) by mouth 3 (three) times daily. 90 tablet 1  . thiamine 100 MG tablet Take 1 tablet (100 mg total) by mouth daily. 30 tablet 1  . tiZANidine (ZANAFLEX) 4 MG tablet Take 1 tablet (4 mg total) by mouth 2 (two) times daily as needed for muscle spasms. 60 tablet 3  . vitamin C (VITAMIN C) 500 MG tablet Take 1 tablet (500 mg total) by mouth 2 (two) times daily. 60 tablet 1  . Vitamin D, Ergocalciferol, (DRISDOL) 50000 units CAPS capsule Take 50,000 Units by mouth every 7 (seven) days.   5  . [START ON 01/10/2018] oxyCODONE (OXY IR/ROXICODONE) 5 MG immediate release tablet Take 1 tablet (5 mg total) by mouth 3 (three) times daily as needed for severe pain. (Patient not taking: Reported on 12/23/2017) 90 tablet 0    Review of Systems: Unable to be obtained secondary to altered mental status. GENERAL:  Rapid down hill course  since Friday/Saurday.Marland Kitchen PERFORMANCE STATUS (ECOG):  4 Lungs: No apparent shortness of breath.  No hemoptysis. Cardiac:  No apparent chest pain. GI:  Minimal oral intake x 3 days.  No nausea, vomiting, diarrhea, constipation, melena or hematochezia. Neuro:  No  seizure activity.  No apparent focal weakness or numbness.  Psych:  Behavioral changes (see HPI). Pain:  No apparent pain.  Physical Exam:  Blood pressure 101/82, pulse (!) 146, temperature 98.7 F (37.1 C), temperature source Oral, resp. rate 19, height _0  (1.803 m), weight 114 lb 11.2 oz (52 kg), SpO2 100 %. GENERAL:  Chronically ill appearing thin man lying under blankets on the medical unit in no acute distress. MENTAL STATUS:  Somnolent. HEAD:  Temporal wasting. Normocephalic, atraumatic, face symmetric, no Cushingoid features. EYES:  Pupils equal round and reactive to light and accomodation.  No conjunctivitis or scleral icterus. ENT:  Oropharynx dry.  RESPIRATORY:  Clear to auscultationwithout rales, wheezes or rhonchi. CARDIOVASCULAR:  Regular rate and rhythm without murmur, rub or gallop. ABDOMEN:  Soft, no aparrent tenderness.  Active bowel sounds and no hepatosplenomegaly.  No masses. SKIN:  Scattered ecchymosis.  No rashes, ulcers or lesions. EXTREMITIES: Thin.  No edema, no skin discoloration or tenderness.  No palpable cords. NEUROLOGICAL: Somnolent.  Pushes me away when trying to perform a physical exam.   Results for orders placed or performed during the [name] encounter of 12/23/17 (from the past 48 hour(s))  CBC     Status: Abnormal   Collection Time: 12/23/17  1:57 PM  Result Value Ref Range   WBC 6.5 3.8 - 10.6 K/uL   RBC 3.19 (L) 4.40 - 5.90 MIL/uL   Hemoglobin 11.2 (L) 13.0 - 18.0 g/dL   HCT 31.9 (L) 40.0 - 52.0 %   MCV 100.2 (H) 80.0 - 100.0 fL   MCH 35.2 (H) 26.0 - 34.0 pg   MCHC 35.2 32.0 - 36.0 g/dL   RDW 14.4 11.5 - 14.5 %   Platelets 204 150 - 440 K/uL    Comment: Performed at [facility], [address] [place], [place] [address]  Comprehensive metabolic panel     Status: Abnormal   Collection Time: 12/23/17  1:57 PM  Result Value Ref Range   Sodium 139 135 - 145 mmol/L   Potassium 3.2 (L) 3.5 - 5.1 mmol/L   Chloride 107 98 - 111 mmol/L   CO2 20 (L) 22 - 32 mmol/L   Glucose, Bld 110 (H) 70 - 99 mg/dL   BUN 11 6 - 20 mg/dL   Creatinine, Ser 0.95 0.61 - 1.24 mg/dL   Calcium 7.6 (L) 8.9 - 10.3 mg/dL   Total Protein 4.6 (L) 6.5 - 8.1 g/dL   Albumin 2.1 (L) 3.5 - 5.0 g/dL   AST 25 15 - 41 U/L   ALT 10 0 - 44 U/L   Alkaline Phosphatase 94 38 - 126 U/L   Total Bilirubin 1.6 (H) 0.3 - 1.2 mg/dL   GFR calc non Af Amer >60 >60 mL/min   GFR calc Af Amer >60 >60 mL/min    Comment: (NOTE) The eGFR has been calculated using the CKD EPI equation. This calculation has not been validated in all clinical situations. eGFR's persistently <60 mL/min signify possible Chronic Kidney Disease.    Anion gap 12 5 - 15    Comment: Performed at [facility], [address] [place], [place] [address]  Troponin I     Status: None   Collection Time: 12/23/17  1:57 PM  Result Value Ref Range   Troponin I <0.03 <0.03 ng/mL    Comment: Performed at [name], [address] [place],  [address]  Ammonia     Status: None   Collection  Time: 12/23/17  2:09 PM  Result Value Ref Range   Ammonia 11 9 - 35 umol/L    Comment: Performed at [name], [address] [place], [place] [address]  Lipase, blood     Status: None   Collection Time: 12/23/17  2:09 PM  Result Value Ref Range   Lipase 37 11 - 51 U/L    Comment: Performed at [facility], [address] [place], [place] [address]  MRSA PCR Screening     Status: None   Collection Time: 12/23/17  6:04 PM  Result Value Ref Range   MRSA by PCR NEGATIVE NEGATIVE    Comment:        The GeneXpert MRSA Assay (FDA approved for NASAL specimens only), is one component of  a comprehensive MRSA colonization surveillance program. It is not intended to diagnose MRSA infection nor to guide or monitor treatment for MRSA infections. Performed at [name], [address] [place], [place] [address]   Basic metabolic panel     Status: Abnormal   Collection Time: 12/24/17  4:47 AM  Result Value Ref Range   Sodium 141 135 - 145 mmol/L   Potassium 3.7 3.5 - 5.1 mmol/L   Chloride 112 (H) 98 - 111 mmol/L   CO2 23 22 - 32 mmol/L   Glucose, Bld 75 70 - 99 mg/dL   BUN 13 6 - 20 mg/dL   Creatinine, Ser 0.77 0.61 - 1.24 mg/dL   Calcium 7.5 (L) 8.9 - 10.3 mg/dL   GFR calc non Af Amer >60 >60 mL/min   GFR calc Af Amer >60 >60 mL/min    Comment: (NOTE) The eGFR has been calculated using the CKD EPI equation. This calculation has not been validated in all clinical situations. eGFR's persistently <60 mL/min signify possible Chronic Kidney Disease.    Anion gap 6 5 - 15    Comment: Performed at [facility], [address] [place], [place] [address]  CBC     Status: Abnormal   Collection Time: 12/24/17  4:47 AM  Result Value Ref Range   WBC 5.1 3.8 - 10.6 K/uL   RBC 2.72 (L) 4.40 - 5.90 MIL/uL   Hemoglobin 9.5 (L) 13.0 - 18.0 g/dL   HCT 27.3 (L) 40.0 - 52.0 %   MCV 100.3 (H) 80.0 - 100.0 fL   MCH 35.0 (H) 26.0 - 34.0 pg   MCHC 34.9 32.0 - 36.0 g/dL   RDW 14.9 (H) 11.5 - 14.5 %   Platelets 161 150 - 440 K/uL    Comment: Performed at [name], [address] [place],  [address]   Ct Head Wo Contrast  Result Date: 12/23/2017 CLINICAL DATA:  Altered mental status EXAM: CT HEAD WITHOUT CONTRAST TECHNIQUE: Contiguous axial images were obtained from the base of the skull through the vertex without intravenous contrast. COMPARISON:  10/16/2017 and 06/21/2017 FINDINGS: Brain: Global atrophy. Extensive low-density in the periventricular white matter has progressed. There is no mass effect, midline shift, or acute hemorrhage.  There is a triangular calcification in the periventricular white matter of the right temporal lobe. This is new. See image 49 of series 4. It is of unknown significance. Vascular: No hyperdense vessel or unexpected calcification. Skull: The cranium is intact. Sinuses/Orbits: Mastoid air cells clear. Small amount of mucoid material in the posterior left ethmoid air cell. The visualized paranasal sinuses are otherwise clear. Visualized orbits are within normal limits. Other: Noncontributory. IMPRESSION: Compared to  the prior study, diffuse white matter disease has worsened. There is also a small calcification within the white matter of the right temporal lobe. Although these findings may represent progressive chronic ischemic change, given changes this year, this may be related to radiation or chemotherapy. No acute hemorrhage or mass effect. Electronically Signed   By: Marybelle Killings M.D.   On: 12/23/2017 15:03   Mr Jeri Cos JO Contrast  Result Date: 12/24/2017 CLINICAL DATA:  Acute confusion. History of lung cancer with solitary brain metastasis. EXAM: MRI HEAD WITHOUT AND WITH CONTRAST TECHNIQUE: Multiplanar, multiecho pulse sequences of the brain and surrounding structures were obtained without and with intravenous contrast. CONTRAST:  87m MULTIHANCE GADOBENATE DIMEGLUMINE 529 MG/ML IV SOLN COMPARISON:  Head CT 12/23/2017 and MRI 10/16/2017 FINDINGS: The study is motion degraded throughout. Brain: There is no evidence of acute infarct, intracranial hemorrhage, midline shift, or extra-axial fluid collection. Extensive, confluent cerebral white matter T2 hyperintensity has slightly progressed from the prior MRI. Patchy pontine T2 hyperintensity is unchanged. 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 Postcontrast T1 weighted imaging is severely motion degraded and essentially nondiagnostic. Assessment for residual enhancement of the previously seen right frontal metastasis is precluded  by the degree of motion artifact. This lesion is no longer clearly evident on FLAIR imaging. No new mass or definite vasogenic edema is evident on noncontrast sequences. Vascular: Major intracranial vascular flow voids are preserved. Skull and upper cervical spine: No gross marrow lesion. Sinuses/Orbits: Unremarkable orbits. Mild posterior ethmoid air cell mucosal thickening bilaterally. Bilateral mastoid effusions, enlarged from the prior MRI and likely related radiation therapy. Other: None. IMPRESSION: 1. Motion degraded examination with nondiagnostic post-contrast imaging. This severely limits assessment for metastatic disease. The small right frontal metastasis on the prior MRI is no longer readily apparent on the noncontrast sequences, and no new mass is evident. 2. No acute infarct. 3. Mild progression of extensive chronic white matter disease which may reflect in part post treatment changes. Electronically Signed   By: ALogan BoresM.D.   On: 12/24/2017 10:15   Dg Chest Portable 1 View  Result Date: 12/23/2017 CLINICAL DATA:  Stage IV lung cancer with bone metastases. New onset altered mental status. EXAM: PORTABLE CHEST 1 VIEW COMPARISON:  Chest radiograph 08/26/2017. FINDINGS: There is a right chest wall port a catheter identified with tip at the cavoatrial junction. Heart size appears normal. No pleural effusion or edema. No airspace opacities identified. IMPRESSION: No acute cardiopulmonary abnormalities identified. Electronically Signed   By: TKerby MoorsM.D.   On: 12/23/2017 14:32    Assessment:  The patient is a 57y.o. gentleman with extensive stage small cell carcinoma s/p chemotherapy, PCI, and CNS metastasis s/p radiation.    He presented with acute mental status changes.  He has had no fever.  There is no obvious infection.  Electrolytes are stable.  No obvious seizure activity.  Head MRI reveals no acute abnormalities (motion degraded).    The family has decided on comfort care and  Hospice.  Plan:   1.  Oncology:  Metastatic small cell lung cancer with a history of treated CNS metastasis.  Underlying malignancy is terminal.  Treatment to date has been palliative.  Patient's [facility]    2.  Neurology:  The etiology of his rapid deterioration is unknown, but likely multi-factorial.  No apparent infectious etiology, electrolyte abnormality, acid/base issue, toxin, or gross CNS change on imaging.  Patient has progressive white matter changes.  Patient aggravated by interventions such as blood draws.  Family wishes support only without further testing or intervention.   Multiple questions asked and answered.  Support provided.  3.  Code status:  DNR/DNI.  No artificial feeding.  Appreciate palliative care consult.   Thank you for allowing me to participate in YOUSSEF FOOTMAN 's care.  I will follow him closely with you while hospitalized.   Lequita Asal, MD  12/24/2017, 8:20 PM

## 2017-12-24 NOTE — Progress Notes (Signed)
RT to patient bedside for breathing treatment. Multiple family members present in room at bedside, refused treatment for patient comfort care.

## 2017-12-24 NOTE — Consult Note (Signed)
Reason for Consult: Altered mental status Referring Physician: Hillary Bow, MD  CC: Altered mental status  HPI: Philip [name] is an 57 y.o. male with pertinent history of extensive stage small cell lung cancer with mets to brain and chest, CAD status post stents, DVT, GI bleed, hypertension, hyperlipidemia who present with 3 days history of altered mental status. Per family who are present and provides history, patient has not been at baseline since 4 days ago, he stopped drinking, eating, taking his medications and smoking. He was also noted to have cognitive and behavioral changes including hallucinations, agitation and restlessness. Per family confusion worsened overnight with increased hallucination, he was observed trying to squeeze water water his sandals and imitating smoking pattern even though he had stopped smoking. Family members verbalized concerns that he is deteriorating and would not wish to continue "like this". He had CT head which did not show any acute finding. He was admitted for further work up of altered mental status.  Past Medical History:  Diagnosis Date  . Anemia   . CAD (coronary artery disease)    a. 08/2014 Inf STEMI/CABG x 3 (LIMA->LAD, VG->Diag, RIMA->RCA).  . DVT, recurrent, lower extremity, acute, left ([place]) 2016  . GIB (gastrointestinal bleeding)    a. In the setting of DAPT-->tolerating plavix only.  . Hyperlipidemia   . Hypertensive heart disease   . Lung cancer ([place])    a. s/p chemo/radiation.  Marland Kitchen PAD (peripheral artery disease) ([place])    a. 03/2015 Periph Angio: short occlusion of L pop w/ evidence of embolization into the DP->5.0x50 mm Inova self-expanding stent;  b. 04/08/2015 ABI: R 1.12, L 0.99; c. 01/2017 LE Duplex: bilat SFA obstructive dzs, patent L pop.  . Tobacco abuse   . Toe amputation status, left [facility]) 07/09/2016   2017    Past Surgical History:  Procedure Laterality Date  . CARDIAC CATHETERIZATION N/A 08/24/2014   Procedure: Left  Heart Cath and Coronary Angiography;  Surgeon: Isaias Cowman, MD;  Location: [place] [facility];  Service: Cardiovascular;  Laterality: N/A;  . CARDIAC CATHETERIZATION N/A 08/24/2014   Procedure: Coronary Stent Intervention;  Surgeon: Isaias Cowman, MD;  Location: [place] [facility];  Service: Cardiovascular;  Laterality: N/A;  . CORONARY ARTERY BYPASS GRAFT N/A 08/25/2014   Procedure: CORONARY ARTERY BYPASS GRAFTING (CABG), ON PUMP, TIMES THREE, USING BILATERAL MAMMARY ARTERIES, RIGHT GREATER SAPHENOUS VEIN HARVESTED ENDOSCOPICALLY;  Surgeon: Melrose Nakayama, MD;  Location: [place];  Service: Open Heart Surgery;  Laterality: N/A;  . ESOPHAGOGASTRODUODENOSCOPY (EGD) WITH PROPOFOL N/A 08/08/2017   Procedure: ESOPHAGOGASTRODUODENOSCOPY (EGD) WITH PROPOFOL;  Surgeon: Jonathon Bellows, MD;  Location: [facility];  Service: Gastroenterology;  Laterality: N/A;  . INCISION AND DRAINAGE ABSCESS Left 11/15/2016   Procedure: INCISION AND DRAINAGE ABSCESS OF LEFT WRIST;  Surgeon: Roseanne Kaufman, MD;  Location: [place];  Service: Orthopedics;  Laterality: Left;  . PEG PLACEMENT N/A 10/01/2017   Procedure: PERCUTANEOUS ENDOSCOPIC GASTROSTOMY (PEG) PLACEMENT;  Surgeon: Lucilla Lame, MD;  Location: [facility] ENDOSCOPY;  Service: Endoscopy;  Laterality: N/A;  . PERIPHERAL VASCULAR CATHETERIZATION N/A 03/30/2015   Procedure: Abdominal Aortogram w/Lower Extremity;  Surgeon: Wellington Hampshire, MD;  Location: [place] [facility];  Service: Cardiovascular;  Laterality: N/A;  . PORTA CATH INSERTION N/A 07/27/2016   Procedure: Glori Luis Cath Insertion;  Surgeon: Algernon Huxley, MD;  Location: [place] [facility];  Service: Cardiovascular;  Laterality: N/A;  . TEE WITHOUT CARDIOVERSION N/A 08/25/2014   Procedure: TRANSESOPHAGEAL ECHOCARDIOGRAM (TEE);  Surgeon:  Melrose Nakayama, MD;  Location: [place];  Service: Open Heart Surgery;  Laterality: N/A;    Family History  Problem Relation Age of Onset  . Hypertension Father    . Lung cancer Father   . Heart attack Mother 63       STENT  . Hyperlipidemia Mother   . Heart attack Brother   . Heart attack Brother   . Breast cancer Sister   . Heart attack Sister   . Colon cancer Paternal Grandfather     Social History:  reports that he has been smoking cigarettes. He has been smoking about 0.25 packs per day. He has never used smokeless tobacco. He reports that he drinks alcohol. He reports that he does not use drugs.  Allergies  Allergen Reactions  . Iodinated Diagnostic Agents Other (See Comments) and Swelling    Pt states that he got "knots behind his ears" Pt states that he got "knots behind his ears".  . Tylenol [Acetaminophen] Palpitations  . Amitriptyline Other (See Comments)    Burning sensation all over  . Aspirin   . Nsaids Other (See Comments)    Blood in stools  . Ibuprofen Other (See Comments) and Nausea Only    Blood in stools  . Tape Rash and Other (See Comments)    PLEASE USE COBAN WRAP; THE PATIENT'S SKIN TEARS WHEN BANDAGES ARE REMOVED!!    Medications:  I have reviewed the patient's current medications. Prior to Admission:  Medications Prior to Admission  Medication Sig Dispense Refill Last Dose  . atorvastatin (LIPITOR) 40 MG tablet TAKE 1 TABLET BY MOUTH DAILY 90 tablet 3 12/20/2017 at Unknown time  . Calcium Carb-Cholecalciferol (CALCIUM + D3 PO) Take 1 tablet by mouth 3 (three) times daily.   12/20/2017 at Unknown  . clopidogrel (PLAVIX) 75 MG tablet Take 1 tablet (75 mg total) by mouth daily. 90 tablet 3 12/20/2017 at Unknown  . diphenhydrAMINE (BENADRYL) 25 mg capsule Take 25 mg by mouth 2 (two) times daily as needed for allergies.    PRN at PRN  . famotidine (PEPCID) 20 MG tablet Take 1 tablet (20 mg total) by mouth 2 (two) times daily. 60 tablet 0 12/20/2017 at Unknown  . feeding supplement, ENSURE ENLIVE, (ENSURE ENLIVE) LIQD Take 237 mLs by mouth 3 (three) times daily between meals. 237 mL 12 Unknown at Unknown  . FLOVENT HFA 220  MCG/ACT inhaler Inhale 2 puffs into the lungs 2 (two) times daily.   5 12/20/2017 at Unknown  . folic acid (FOLVITE) 1 MG tablet Take 1 tablet (1 mg total) by mouth daily. 30 tablet 0 12/20/2017 at Unknown  . hydrochlorothiazide (HYDRODIURIL) 12.5 MG tablet Take 1 tablet by mouth daily.  2 12/20/2017 at Unknown  . lidocaine-prilocaine (EMLA) cream Apply to affected area once 30 g 3 PRN at PRN  . magnesium oxide (MAG-OX) 400 (241.3 Mg) MG tablet Take 1 tablet (400 mg total) by mouth 3 (three) times daily. 90 tablet 3 12/20/2017 at Unknown  . metoCLOPramide (REGLAN) 5 MG tablet Take 1 tablet (5 mg total) by mouth 4 (four) times daily -  before meals and at bedtime. 100 tablet 0 12/20/2017 at Unknown  . metoprolol tartrate (LOPRESSOR) 25 MG tablet TAKE 1 TABLET BY MOUTH TWICE DAILY 180 tablet 3 12/20/2017 at Unknown  . Multiple Vitamin (MULTIVITAMIN WITH MINERALS) TABS tablet Take 1 tablet by mouth daily. 30 tablet 1 12/20/2017 at Unknown  . ondansetron (ZOFRAN-ODT) 8 MG disintegrating tablet Take 1 tablet (  8 mg total) by mouth every 8 (eight) hours as needed for nausea or vomiting. 30 tablet 1 prn at prn  . oxyCODONE (OXY IR/ROXICODONE) 5 MG immediate release tablet Take 1 tablet (5 mg total) by mouth 3 (three) times daily as needed for severe pain. 90 tablet 0 prn at prn  . pantoprazole (PROTONIX) 40 MG tablet Take 40 mg by mouth daily.   5 12/20/2017 at Unknown  . potassium phosphate, monobasic, (K-PHOS ORIGINAL) 500 MG tablet Take 1 tablet (500 mg total) by mouth 4 (four) times daily -  with meals and at bedtime. 10 tablet 0 12/20/2017 at Unknown  . pregabalin (LYRICA) 50 MG capsule 50 mg qhs x 1 week, then 50 mg BID week 2, then 50 mg TID thereafter (Patient taking differently: Take 50 mg by mouth 3 (three) times daily. 50 mg qhs x 1 week, then 50 mg BID week 2, then 50 mg TID thereafter) 90 capsule 1 12/20/2017 at Unknown  . PROAIR HFA 108 (90 Base) MCG/ACT inhaler Inhale 2 puffs into the lungs every 6 (six) hours as  needed for wheezing or shortness of breath.   5 prn at prn  . promethazine (PHENERGAN) 25 MG suppository Place 1 suppository (25 mg total) rectally every 6 (six) hours as needed for nausea or vomiting. 12 each 5 prn at prn  . senna-docusate (SENOKOT-S) 8.6-50 MG tablet Take 1 tablet by mouth at bedtime as needed for mild constipation. 30 tablet 0 prn at prn  . sodium chloride 1 g tablet Take 1 tablet (1 g total) by mouth 2 (two) times daily with a meal. 60 tablet 0 12/20/2017 at Unknown  . STIOLTO RESPIMAT 2.5-2.5 MCG/ACT AERS Inhale 2 puffs into the lungs daily.   5 12/20/2017 at Unknown  . sucralfate (CARAFATE) 1 g tablet Take 1 tablet (1 g total) by mouth 3 (three) times daily. 90 tablet 1 12/20/2017 at Unknown  . thiamine 100 MG tablet Take 1 tablet (100 mg total) by mouth daily. 30 tablet 1 12/20/2017 at Unknown  . tiZANidine (ZANAFLEX) 4 MG tablet Take 1 tablet (4 mg total) by mouth 2 (two) times daily as needed for muscle spasms. 60 tablet 3 prn at prn  . vitamin C (VITAMIN C) 500 MG tablet Take 1 tablet (500 mg total) by mouth 2 (two) times daily. 60 tablet 1 12/20/2017 at Unknown  . Vitamin D, Ergocalciferol, (DRISDOL) 50000 units CAPS capsule Take 50,000 Units by mouth every 7 (seven) days.   5 12/15/2017 at Unknown  . [START ON 01/10/2018] oxyCODONE (OXY IR/ROXICODONE) 5 MG immediate release tablet Take 1 tablet (5 mg total) by mouth 3 (three) times daily as needed for severe pain. (Patient not taking: Reported on 12/23/2017) 90 tablet 0 Not Taking at Unknown time   Scheduled: . atorvastatin  40 mg Oral Daily  . budesonide (PULMICORT) nebulizer solution  0.5 mg Nebulization BID  . calcium-vitamin D  1 tablet Oral TID  . clopidogrel  75 mg Oral Daily  . enoxaparin (LOVENOX) injection  40 mg Subcutaneous Q24H  . famotidine  20 mg Oral BID  . feeding supplement (ENSURE ENLIVE)  237 mL Oral TID BM  . folic acid  1 mg Oral Daily  . magnesium oxide  400 mg Oral TID  . metoCLOPramide  5 mg Oral TID AC &  HS  . metoprolol tartrate  25 mg Oral BID  . multivitamin with minerals  1 tablet Oral Daily  . nicotine  21 mg Transdermal  Daily  . pantoprazole  40 mg Oral Daily  . potassium chloride  40 mEq Oral Once  . sucralfate  1 g Oral TID  . thiamine  100 mg Oral Daily  . ascorbic acid  500 mg Oral BID  . Vitamin D (Ergocalciferol)  50,000 Units Oral Q7 days    ROS: Unable to obtain due to altered mental status  Physical Exam   Vitals Blood pressure 101/82, pulse (!) 146, temperature 98.7 F (37.1 C), temperature source Oral, resp. rate 19, height 5\' 11"  (1.803 m), weight 52 kg, SpO2 100 %.   Neurological Exam . Arouses to tactile stimuli, appears restless.  Says 1-2 words . I. Olfactory not examined . II: Visual fields not tested. Pupils were equal, round and reactive to light and accommodation . III,IV, VI: Intact oculocephalic maneuver . V,VII: Right facial droop . VIII: Unable to assess . IX, X: Unable to assess, gag reflex deffered . XI: Unable to asses . XII: Unable to assess . Tone is normal in all extremities, no abnormal movements seen  . Moves all extremities although weakly and will not cooperate for formal testing  . Deep tendon reflexes were symmetric  . Withdraws to painful stimuli in all extremities . Gait and station not tested due to cooperation  Laboratory Studies:   Basic Metabolic Panel: Recent Labs  Lab 12/23/17 1357 12/24/17 0447  NA 139 141  K 3.2* 3.7  CL 107 112*  CO2 20* 23  GLUCOSE 110* 75  BUN 11 13  CREATININE 0.95 0.77  CALCIUM 7.6* 7.5*    Liver Function Tests: Recent Labs  Lab 12/23/17 1357  AST 25  ALT 10  ALKPHOS 94  BILITOT 1.6*  PROT 4.6*  ALBUMIN 2.1*   Recent Labs  Lab 12/23/17 1409  LIPASE 37   Recent Labs  Lab 12/23/17 1409  AMMONIA 11    CBC: Recent Labs  Lab 12/23/17 1357 12/24/17 0447  WBC 6.5 5.1  HGB 11.2* 9.5*  HCT 31.9* 27.3*  MCV 100.2* 100.3*  PLT 204 161    Cardiac Enzymes: Recent Labs   Lab 12/23/17 1357  TROPONINI <0.03    BNP: Invalid input(s): POCBNP  CBG: No results for input(s): GLUCAP in the last 168 hours.  Microbiology: Results for orders placed or performed during the hospital encounter of 12/23/17  MRSA PCR Screening     Status: None   Collection Time: 12/23/17  6:04 PM  Result Value Ref Range Status   MRSA by PCR NEGATIVE NEGATIVE Final    Comment:        The GeneXpert MRSA Assay (FDA approved for NASAL specimens only), is one component of a comprehensive MRSA colonization surveillance program. It is not intended to diagnose MRSA infection nor to guide or monitor treatment for MRSA infections. Performed at [facility], [address] [place], [place] [address]     Coagulation Studies: No results for input(s): LABPROT, INR in the last 72 hours.  Urinalysis: No results for input(s): COLORURINE, LABSPEC, PHURINE, GLUCOSEU, HGBUR, BILIRUBINUR, KETONESUR, PROTEINUR, UROBILINOGEN, NITRITE, LEUKOCYTESUR in the last 168 hours.  Invalid input(s): APPERANCEUR  Lipid Panel:     Component Value Date/Time   CHOL 182 07/27/2016 0602   TRIG 115 07/27/2016 0602   HDL 47 07/27/2016 0602   CHOLHDL 3.9 07/27/2016 0602   VLDL 23 07/27/2016 0602   LDLCALC 112 (H) 07/27/2016 0602    HgbA1C:  Lab Results  Component Value Date   HGBA1C 5.1 07/27/2016  Urine Drug Screen:      Component Value Date/Time   LABOPIA NONE DETECTED 10/18/2017 1304   COCAINSCRNUR NONE DETECTED 10/18/2017 1304   LABBENZ NONE DETECTED 10/18/2017 1304   AMPHETMU NONE DETECTED 10/18/2017 1304   THCU POSITIVE (A) 10/18/2017 1304   LABBARB (A) 10/18/2017 1304    Result not available. Reagent lot number recalled by manufacturer.    Alcohol Level: No results for input(s): ETH in the last 168 hours.  Imaging: Ct Head Wo Contrast  Result Date: 12/23/2017 CLINICAL DATA:  Altered mental status EXAM: CT HEAD WITHOUT CONTRAST TECHNIQUE: Contiguous axial images were  obtained from the base of the skull through the vertex without intravenous contrast. COMPARISON:  10/16/2017 and 06/21/2017 FINDINGS: Brain: Global atrophy. Extensive low-density in the periventricular white matter has progressed. There is no mass effect, midline shift, or acute hemorrhage. There is a triangular calcification in the periventricular white matter of the right temporal lobe. This is new. See image 49 of series 4. It is of unknown significance. Vascular: No hyperdense vessel or unexpected calcification. Skull: The cranium is intact. Sinuses/Orbits: Mastoid air cells clear. Small amount of mucoid material in the posterior left ethmoid air cell. The visualized paranasal sinuses are otherwise clear. Visualized orbits are within normal limits. Other: Noncontributory. IMPRESSION: Compared to the prior study, diffuse white matter disease has worsened. There is also a small calcification within the white matter of the right temporal lobe. Although these findings may represent progressive chronic ischemic change, given changes this year, this may be related to radiation or chemotherapy. No acute hemorrhage or mass effect. Electronically Signed   By: Marybelle Killings M.D.   On: 12/23/2017 15:03   Mr Jeri Cos ZO Contrast  Result Date: 12/24/2017 CLINICAL DATA:  Acute confusion. History of lung cancer with solitary brain metastasis. EXAM: MRI HEAD WITHOUT AND WITH CONTRAST TECHNIQUE: Multiplanar, multiecho pulse sequences of the brain and surrounding structures were obtained without and with intravenous contrast. CONTRAST:  47mL MULTIHANCE GADOBENATE DIMEGLUMINE 529 MG/ML IV SOLN COMPARISON:  Head CT 12/23/2017 and MRI 10/16/2017 FINDINGS: The study is motion degraded throughout. Brain: There is no evidence of acute infarct, intracranial hemorrhage, midline shift, or extra-axial fluid collection. Extensive, confluent cerebral white matter T2 hyperintensity has slightly progressed from the prior MRI. Patchy pontine  T2 hyperintensity is unchanged. 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 Postcontrast T1 weighted imaging is severely motion degraded and essentially nondiagnostic. Assessment for residual enhancement of the previously seen right frontal metastasis is precluded by the degree of motion artifact. This lesion is no longer clearly evident on FLAIR imaging. No new mass or definite vasogenic edema is evident on noncontrast sequences. Vascular: Major intracranial vascular flow voids are preserved. Skull and upper cervical spine: No gross marrow lesion. Sinuses/Orbits: Unremarkable orbits. Mild posterior ethmoid air cell mucosal thickening bilaterally. Bilateral mastoid effusions, enlarged from the prior MRI and likely related radiation therapy. Other: None. IMPRESSION: 1. Motion degraded examination with nondiagnostic post-contrast imaging. This severely limits assessment for metastatic disease. The small right frontal metastasis on the prior MRI is no longer readily apparent on the noncontrast sequences, and no new mass is evident. 2. No acute infarct. 3. Mild progression of extensive chronic white matter disease which may reflect in part post treatment changes. Electronically Signed   By: Logan Bores M.D.   On: 12/24/2017 10:15   Dg Chest Portable 1 View  Result Date: 12/23/2017 CLINICAL DATA:  Stage IV lung cancer with bone  metastases. New onset altered mental status. EXAM: PORTABLE CHEST 1 VIEW COMPARISON:  Chest radiograph 08/26/2017. FINDINGS: There is a right chest wall port a catheter identified with tip at the cavoatrial junction. Heart size appears normal. No pleural effusion or edema. No airspace opacities identified. IMPRESSION: No acute cardiopulmonary abnormalities identified. Electronically Signed   By: Kerby Moors M.D.   On: 12/23/2017 14:32     Patient seen and examined.  Clinical course and management discussed.  Necessary edits performed.  I agree with the above.   Assessment and plan of care developed and discussed below.     Assessment: Philip [name] is a 57 y.o. male with extensive stage small cell lung cancer presenting with 3 days of altered mental status likely multifactorial in the setting of extensive stage small cell lung cancer with mets, deconditioning due to not eating or drinking for days. Per family wishes, would not wish to proceed with further testing or treatment due to poor prognosis of primary condition.  Patient expressed wishes to not have artificial feeding, etc.     Plan 1. Agree with palliative/hospice plan and disposition  This patient was staffed with Dr. Magda Paganini, [place] Mince who personally evaluated patient, reviewed documentation and agreed with assessment and plan of care as above.  Rufina Falco, DNP, FNP-BC Board certified Nurse Practitioner Neurology Department  12/24/2017, 1:09 PM    Alexis Goodell, MD Neurology [phone]  12/24/2017  1:56 PM

## 2017-12-24 NOTE — Consult Note (Addendum)
Consultation Note Date: 12/24/2017   Patient Name: Philip [name]  DOB: [birth year]  MRN: [id number]  Age / Sex: 57 y.o., male  PCP: Lorelee Market, MD Referring Physician: Hillary Bow, MD  Reason for Consultation: Establishing goals of care  HPI/Patient Profile: Philip [name]  is a 57 y.o. male with a known history of anemia, coronary artery disease, lung cancer metastatic to bone, charcot joint of the foot, was brought to the emergency room for confusion.  According to family members patient has been confused.  Clinical Assessment and Goals of Care: Patient is resting in bed. He does not open eyes to speak to Probation officer. Many family members at bedside including wife.    We discussed his diagnosis, prognosis, GOC, EOL wishes disposition and options.  A detailed discussion was had today regarding advanced directives.  Concepts specific to code status, artifical feeding and hydration, continued IV antibiotics and rehospitalization was discussed.  The difference between an aggressive medical intervention path and a comfort care path was discussed.  Values and goals of care important to patient and family were attempted to be elicited.  Philip [name] has 5 children and 1 deceased. He has been undergoing chemo and radiation. Wife states he does not like the treatments. She states as of 2 weeks ago, he was talking with family and driving. He became more agitated. As of 3 days ago, he began refusing food, liquids, and medications. He has been using a urinal.  Family states he has been ready to stop treatments for a while and shift to a comfort focus.   Explained plan to patient and asked if he was in agreement, he said "yea".     MOST form completed with wife for DNR, comfort measures, no abx, no IV fluids, no feeding tube.   SUMMARY OF RECOMMENDATIONS   Family would like comfort care at the hospice home.  Referral placed.   Code Status/Advance Care Planning:  DNR    Symptom Management:   EOL order set placed.   Palliative Prophylaxis:   Eye Care and Oral Care  Additional Recommendations (Limitations, Scope, Preferences):  Full Comfort Care  Psycho-social/Spiritual:   Desire for further Chaplaincy support:yes  Prognosis:   < 2 weeks Stopping palliative chemo/radiation.  Nor oral intake, refusing meds.   Discharge Planning: Hospice facility      Primary Diagnoses: Present on Admission: . Encephalopathy   I have reviewed the medical record, interviewed the patient and family, and examined the patient. The following aspects are pertinent.  Past Medical History:  Diagnosis Date  . Anemia   . CAD (coronary artery disease)    a. 08/2014 Inf STEMI/CABG x 3 (LIMA->LAD, VG->Diag, RIMA->RCA).  . DVT, recurrent, lower extremity, acute, left ([place]) 2016  . GIB (gastrointestinal bleeding)    a. In the setting of DAPT-->tolerating plavix only.  . Hyperlipidemia   . Hypertensive heart disease   . Lung cancer ([place])    a. s/p chemo/radiation.  Marland Kitchen PAD (peripheral artery disease) ([place])    a. 03/2015 Periph  Angio: short occlusion of L pop w/ evidence of embolization into the DP->5.0x50 mm Inova self-expanding stent;  b. 04/08/2015 ABI: R 1.12, L 0.99; c. 01/2017 LE Duplex: bilat SFA obstructive dzs, patent L pop.  . Tobacco abuse   . Toe amputation status, left [facility]) 07/09/2016   2017   Social History   Socioeconomic History  . Marital status: Married    Spouse name: Not on file  . Number of children: Not on file  . Years of education: Not on file  . Highest education level: Not on file  Occupational History  . Not on file  Social Needs  . Financial resource strain: Not on file  . Food insecurity:    Worry: Not on file    Inability: Not on file  . Transportation needs:    Medical: Not on file    Non-medical: Not on file  Tobacco Use  . Smoking status: Current Every  Day Smoker    Packs/day: 0.25    Types: Cigarettes  . Smokeless tobacco: Never Used  . Tobacco comment: 5-6 cigs in a week.   Substance and Sexual Activity  . Alcohol use: Yes    Alcohol/week: 0.0 standard drinks    Comment: Sometimes on w/e.  . Drug use: No  . Sexual activity: Not on file  Lifestyle  . Physical activity:    Days per week: Not on file    Minutes per session: Not on file  . Stress: Not on file  Relationships  . Social connections:    Talks on phone: Not on file    Gets together: Not on file    Attends religious service: Not on file    Active member of club or organization: Not on file    Attends meetings of clubs or organizations: Not on file    Relationship status: Not on file  Other Topics Concern  . Not on file  Social History Narrative  . Not on file   Family History  Problem Relation Age of Onset  . Hypertension Father   . Lung cancer Father   . Heart attack Mother 16       STENT  . Hyperlipidemia Mother   . Heart attack Brother   . Heart attack Brother   . Breast cancer Sister   . Heart attack Sister   . Colon cancer Paternal Grandfather    Scheduled Meds: . atorvastatin  40 mg Oral Daily  . budesonide (PULMICORT) nebulizer solution  0.5 mg Nebulization BID  . calcium-vitamin D  1 tablet Oral TID  . clopidogrel  75 mg Oral Daily  . enoxaparin (LOVENOX) injection  40 mg Subcutaneous Q24H  . famotidine  20 mg Oral BID  . feeding supplement (ENSURE ENLIVE)  237 mL Oral TID BM  . folic acid  1 mg Oral Daily  . magnesium oxide  400 mg Oral TID  . metoCLOPramide  5 mg Oral TID AC & HS  . metoprolol tartrate  25 mg Oral BID  . multivitamin with minerals  1 tablet Oral Daily  . nicotine  21 mg Transdermal Daily  . pantoprazole  40 mg Oral Daily  . potassium chloride  40 mEq Oral Once  . sucralfate  1 g Oral TID  . thiamine  100 mg Oral Daily  . ascorbic acid  500 mg Oral BID  . Vitamin D (Ergocalciferol)  50,000 Units Oral Q7 days    Continuous Infusions: . 0.9 % NaCl with KCl 20 mEq /  L 75 mL/hr at 12/24/17 0400   PRN Meds:.albuterol, ondansetron, oxyCODONE, senna-docusate, tiZANidine Medications Prior to Admission:  Prior to Admission medications   Medication Sig Start Date End Date Taking? Authorizing Provider  atorvastatin (LIPITOR) 40 MG tablet TAKE 1 TABLET BY MOUTH DAILY 10/01/17  Yes Wellington Hampshire, MD  Calcium Carb-Cholecalciferol (CALCIUM + D3 PO) Take 1 tablet by mouth 3 (three) times daily.   Yes [provider]  clopidogrel (PLAVIX) 75 MG tablet Take 1 tablet (75 mg total) by mouth daily. 11/14/16  Yes Wellington Hampshire, MD  diphenhydrAMINE (BENADRYL) 25 mg capsule Take 25 mg by mouth 2 (two) times daily as needed for allergies.    Yes [provider]  famotidine (PEPCID) 20 MG tablet Take 1 tablet (20 mg total) by mouth 2 (two) times daily. 08/30/17  Yes Vaughan Basta, MD  feeding supplement, ENSURE ENLIVE, (ENSURE ENLIVE) LIQD Take 237 mLs by mouth 3 (three) times daily between meals. 08/30/17  Yes Vaughan Basta, MD  FLOVENT HFA 220 MCG/ACT inhaler Inhale 2 puffs into the lungs 2 (two) times daily.  02/01/17  Yes [provider]  folic acid (FOLVITE) 1 MG tablet Take 1 tablet (1 mg total) by mouth daily. 08/30/17  Yes Vaughan Basta, MD  hydrochlorothiazide (HYDRODIURIL) 12.5 MG tablet Take 1 tablet by mouth daily. 09/06/17  Yes [provider]  lidocaine-prilocaine (EMLA) cream Apply to affected area once 10/10/17  Yes Karen Kitchens, NP  magnesium oxide (MAG-OX) 400 (241.3 Mg) MG tablet Take 1 tablet (400 mg total) by mouth 3 (three) times daily. 05/16/17  Yes Karen Kitchens, NP  metoCLOPramide (REGLAN) 5 MG tablet Take 1 tablet (5 mg total) by mouth 4 (four) times daily -  before meals and at bedtime. 08/30/17  Yes Vaughan Basta, MD  metoprolol tartrate (LOPRESSOR) 25 MG tablet TAKE 1 TABLET BY MOUTH TWICE DAILY 12/05/16  Yes Wellington Hampshire,  MD  Multiple Vitamin (MULTIVITAMIN WITH MINERALS) TABS tablet Take 1 tablet by mouth daily. 08/31/17  Yes Vaughan Basta, MD  ondansetron (ZOFRAN-ODT) 8 MG disintegrating tablet Take 1 tablet (8 mg total) by mouth every 8 (eight) hours as needed for nausea or vomiting. 08/02/17  Yes Karen Kitchens, NP  oxyCODONE (OXY IR/ROXICODONE) 5 MG immediate release tablet Take 1 tablet (5 mg total) by mouth 3 (three) times daily as needed for severe pain. 12/11/17 01/10/18 Yes Gillis Santa, MD  pantoprazole (PROTONIX) 40 MG tablet Take 40 mg by mouth daily.  10/01/17  Yes [provider]  potassium phosphate, monobasic, (K-PHOS ORIGINAL) 500 MG tablet Take 1 tablet (500 mg total) by mouth 4 (four) times daily -  with meals and at bedtime. 08/30/17  Yes Vaughan Basta, MD  pregabalin (LYRICA) 50 MG capsule 50 mg qhs x 1 week, then 50 mg BID week 2, then 50 mg TID thereafter Patient taking differently: Take 50 mg by mouth 3 (three) times daily. 50 mg qhs x 1 week, then 50 mg BID week 2, then 50 mg TID thereafter 10/31/17  Yes Gillis Santa, MD  PROAIR HFA 108 (90 Base) MCG/ACT inhaler Inhale 2 puffs into the lungs every 6 (six) hours as needed for wheezing or shortness of breath.  02/01/17  Yes [provider]  promethazine (PHENERGAN) 25 MG suppository Place 1 suppository (25 mg total) rectally every 6 (six) hours as needed for nausea or vomiting. 04/11/17  Yes Corcoran, Drue Second, MD  senna-docusate (SENOKOT-S) 8.6-50 MG tablet Take 1 tablet by  mouth at bedtime as needed for mild constipation. 08/30/17  Yes Vaughan Basta, MD  sodium chloride 1 g tablet Take 1 tablet (1 g total) by mouth 2 (two) times daily with a meal. 11/07/17  Yes Karen Kitchens, NP  STIOLTO RESPIMAT 2.5-2.5 MCG/ACT AERS Inhale 2 puffs into the lungs daily.  02/04/17  Yes [provider]  sucralfate (CARAFATE) 1 g tablet Take 1 tablet (1 g total) by mouth 3 (three) times daily. 10/03/17  Yes Karen Kitchens, NP  thiamine 100 MG tablet Take 1 tablet (100 mg total) by mouth daily. 08/31/17  Yes Vaughan Basta, MD  tiZANidine (ZANAFLEX) 4 MG tablet Take 1 tablet (4 mg total) by mouth 2 (two) times daily as needed for muscle spasms. 12/11/17 01/10/18 Yes Gillis Santa, MD  vitamin C (VITAMIN C) 500 MG tablet Take 1 tablet (500 mg total) by mouth 2 (two) times daily. 08/30/17  Yes Vaughan Basta, MD  Vitamin D, Ergocalciferol, (DRISDOL) 50000 units CAPS capsule Take 50,000 Units by mouth every 7 (seven) days.  03/11/17  Yes [provider]  oxyCODONE (OXY IR/ROXICODONE) 5 MG immediate release tablet Take 1 tablet (5 mg total) by mouth 3 (three) times daily as needed for severe pain. Patient not taking: Reported on 12/23/2017 01/10/18 02/09/18  Gillis Santa, MD   Allergies  Allergen Reactions  . Iodinated Diagnostic Agents Other (See Comments) and Swelling    Pt states that he got "knots behind his ears" Pt states that he got "knots behind his ears".  . Tylenol [Acetaminophen] Palpitations  . Amitriptyline Other (See Comments)    Burning sensation all over  . Aspirin   . Nsaids Other (See Comments)    Blood in stools  . Ibuprofen Other (See Comments) and Nausea Only    Blood in stools  . Tape Rash and Other (See Comments)    PLEASE USE COBAN WRAP; THE PATIENT'S SKIN TEARS WHEN BANDAGES ARE REMOVED!!   Review of Systems  Unable to perform ROS   Physical Exam  Constitutional: No distress.  Pulmonary/Chest: Effort normal.  Neurological: He is alert.    Vital Signs: BP 101/82   Pulse (!) 146   Temp 98.7 F (37.1 C) (Oral)   Resp 19   Ht 5\' 11"  (6.295 m)   Wt 52 kg   SpO2 100%   BMI 16.00 kg/m  Pain Scale: PAINAD POSS *See Group Information*: 1-Acceptable,Awake and alert Pain Score: Asleep   SpO2: SpO2: 100 % O2 Device:SpO2: 100 % O2 Flow Rate: .   IO: Intake/output summary:   Intake/Output Summary (Last 24 hours) at 12/24/2017 1251 Last data filed at  12/24/2017 0400 Gross per 24 hour  Intake 662.6 ml  Output 0 ml  Net 662.6 ml    LBM: Last BM Date: 12/21/17 Baseline Weight: Weight: 52 kg Most recent weight: Weight: 52 kg     Palliative Assessment/Data: 30%   Oncology notified.   Time In: 1:00 Time Out: 2:10 Time Total: 70 min Greater than 50%  of this time was spent counseling and coordinating care related to the above assessment and plan.  Signed by: Asencion Gowda, NP   Please contact Palliative Medicine Team phone at [phone] for questions and concerns.  For individual provider: See Shea Evans

## 2017-12-24 NOTE — Progress Notes (Signed)
Advance care planning  Purpose of Encounter Acute encephalopathy, malnutrition and code status discussion  Parties in Attendance Wife, extended family  Patients Decisional capacity [place], confused  Unable to participate in medical decision making  Extended family in the room but mainly wife participated in discussion.  Patient was full code when admitted.  We discussed regarding patient having advanced directives.  Wife tells me that he did tell her he did not want a feeding tube since he had one in the past.  They never discussed about intubation or CPR.  But after discussing with other family members wife has decided that patient would not want to have intubation or CPR.  Patient may DO NOT RESUSCITATE.  Waiting for palliative care, oncology and neurology at this time.   Time spent - 18 minutes

## 2017-12-24 NOTE — Progress Notes (Signed)
   12/24/17 1300  Clinical Encounter Type  Visited With Patient and family together  Visit Type Spiritual support  Referral From Palliative care team  Recommendations Follow-up as requested.  Spiritual Encounters  Spiritual Needs Grief support;Prayer   At the request of Palliative Care, Chaplain engaged the family in conversation and emotional support. As patient was wrapped up in a blanket and non-communicative, Chaplain focused care on the patient's wife, Mariann Laster, his sister. and mother. Chaplain provided active listening and offered words of assurance and comfort. Further, Chaplain offered a prayer of peace, blessing, and consecration. Mariann Laster was thankful for the emotional support and Chaplain assured her that we are here to support the family. Chaplain checked in with Palliative Care after the visit to relay the situation.

## 2017-12-24 NOTE — Progress Notes (Signed)
Initial Nutrition Assessment  DOCUMENTATION CODES:   Severe malnutrition in context of chronic illness  INTERVENTION:  Ensure Enlive TID (each supplement provides 350kcal and 20 grams of protein)  MVI daily  Vitamin C 500 mg BID   NUTRITION DIAGNOSIS:   Severe Malnutrition related to cancer and cancer related treatments as evidenced by severe fat depletion, severe muscle depletion.  GOAL:   Patient will meet greater than or equal to 90% of their needs  MONITOR:   PO intake, Weight trends  REASON FOR ASSESSMENT:   Malnutrition Screening Tool    ASSESSMENT:   57 y.o. male w/ hx of lung and brain cancer, heart disease admitted w/ encephalopathy  Visited pt with family in room. Per family report, pt has been NPO since Friday 9/6. Pt was asleep during visit; family says he has been sleeping all day. Pt likes Ensure but family says he is not able to finish them recently. NFPE not appropriate to be completed today; pt familiar to nutrition dept, severe malnutrition diagnosed during previous RD exams. Offered family support if he begins to eat within his stay.   Labs reviewed and include: RBC 2.72 (L), Hbg 9.5 (L), HCT 27.3 (L), Ca 7.5 (L)  Medications reviewed and include: Lipitor, Pulmicort, Calcium/vitamin D, Plavix, lovenox, Pepcid, folic acid, mag oxide, Reglan, Lopressor, Protonix, potassium chloride, sucralfate, thiamine, vitamin D   Diet Order:   Diet Order            Diet Heart Room service appropriate? Yes; Fluid consistency: Thin  Diet effective now              EDUCATION NEEDS:   Not appropriate for education at this time  Skin:  Skin Assessment: Reviewed RN Assessment(Ecchymosis, skin tear (left arm), amputation (left toe))  Last BM:   9/10 (type 5)  Height:   Ht Readings from Last 1 Encounters:  12/23/17 5\' 11"  (1.803 m)    Weight:   Wt Readings from Last 1 Encounters:  12/23/17 52 kg    Ideal Body Weight:  78 kg  BMI:  Body mass index is  16 kg/m.  Estimated Nutritional Needs:   Kcal:  1287-8676  Protein:  100-115  Fluid:  >2L    [employer], Dietetic Intern [phone]

## 2017-12-25 DIAGNOSIS — Z7902 Long term (current) use of antithrombotics/antiplatelets: Secondary | ICD-10-CM | POA: Diagnosis not present

## 2017-12-25 DIAGNOSIS — G9340 Encephalopathy, unspecified: Secondary | ICD-10-CM | POA: Diagnosis present

## 2017-12-25 DIAGNOSIS — C3490 Malignant neoplasm of unspecified part of unspecified bronchus or lung: Secondary | ICD-10-CM | POA: Diagnosis present

## 2017-12-25 DIAGNOSIS — Z8349 Family history of other endocrine, nutritional and metabolic diseases: Secondary | ICD-10-CM | POA: Diagnosis not present

## 2017-12-25 DIAGNOSIS — Z8249 Family history of ischemic heart disease and other diseases of the circulatory system: Secondary | ICD-10-CM | POA: Diagnosis not present

## 2017-12-25 DIAGNOSIS — G9341 Metabolic encephalopathy: Secondary | ICD-10-CM | POA: Diagnosis present

## 2017-12-25 DIAGNOSIS — Z951 Presence of aortocoronary bypass graft: Secondary | ICD-10-CM | POA: Diagnosis not present

## 2017-12-25 DIAGNOSIS — I252 Old myocardial infarction: Secondary | ICD-10-CM | POA: Diagnosis not present

## 2017-12-25 DIAGNOSIS — Z9221 Personal history of antineoplastic chemotherapy: Secondary | ICD-10-CM | POA: Diagnosis not present

## 2017-12-25 DIAGNOSIS — E43 Unspecified severe protein-calorie malnutrition: Secondary | ICD-10-CM | POA: Diagnosis present

## 2017-12-25 DIAGNOSIS — R4182 Altered mental status, unspecified: Secondary | ICD-10-CM | POA: Diagnosis not present

## 2017-12-25 DIAGNOSIS — G9389 Other specified disorders of brain: Secondary | ICD-10-CM | POA: Diagnosis present

## 2017-12-25 DIAGNOSIS — I2510 Atherosclerotic heart disease of native coronary artery without angina pectoris: Secondary | ICD-10-CM | POA: Diagnosis present

## 2017-12-25 DIAGNOSIS — Z801 Family history of malignant neoplasm of trachea, bronchus and lung: Secondary | ICD-10-CM | POA: Diagnosis not present

## 2017-12-25 DIAGNOSIS — F17210 Nicotine dependence, cigarettes, uncomplicated: Secondary | ICD-10-CM | POA: Diagnosis present

## 2017-12-25 DIAGNOSIS — C7951 Secondary malignant neoplasm of bone: Secondary | ICD-10-CM | POA: Diagnosis present

## 2017-12-25 DIAGNOSIS — Z681 Body mass index (BMI) 19 or less, adult: Secondary | ICD-10-CM | POA: Diagnosis not present

## 2017-12-25 DIAGNOSIS — Z515 Encounter for palliative care: Secondary | ICD-10-CM | POA: Diagnosis not present

## 2017-12-25 DIAGNOSIS — Z955 Presence of coronary angioplasty implant and graft: Secondary | ICD-10-CM | POA: Diagnosis not present

## 2017-12-25 DIAGNOSIS — Y842 Radiological procedure and radiotherapy as the cause of abnormal reaction of the patient, or of later complication, without mention of misadventure at the time of the procedure: Secondary | ICD-10-CM | POA: Diagnosis present

## 2017-12-25 DIAGNOSIS — C7931 Secondary malignant neoplasm of brain: Secondary | ICD-10-CM | POA: Diagnosis not present

## 2017-12-25 DIAGNOSIS — Z66 Do not resuscitate: Secondary | ICD-10-CM | POA: Diagnosis not present

## 2017-12-25 DIAGNOSIS — E876 Hypokalemia: Secondary | ICD-10-CM | POA: Diagnosis present

## 2017-12-25 DIAGNOSIS — Z803 Family history of malignant neoplasm of breast: Secondary | ICD-10-CM | POA: Diagnosis not present

## 2017-12-25 DIAGNOSIS — I739 Peripheral vascular disease, unspecified: Secondary | ICD-10-CM | POA: Diagnosis present

## 2017-12-25 DIAGNOSIS — Z800 Family history of malignant neoplasm of digestive organs: Secondary | ICD-10-CM | POA: Diagnosis not present

## 2017-12-25 DIAGNOSIS — M14679 Charcot's joint, unspecified ankle and foot: Secondary | ICD-10-CM | POA: Diagnosis present

## 2017-12-25 MED ORDER — HALOPERIDOL LACTATE 5 MG/ML IJ SOLN: 2.0000 mg | Freq: Four times a day (QID) | INTRAMUSCULAR | Status: DC | PRN

## 2017-12-25 MED ORDER — LORAZEPAM 2 MG/ML IJ SOLN: 1.0000 mg | INTRAMUSCULAR | Status: DC | PRN

## 2017-12-25 MED ORDER — DIPHENHYDRAMINE HCL 50 MG/ML IJ SOLN
25.0000 mg | Freq: Once | INTRAMUSCULAR | Status: AC
  Administered 2017-12-25: 25 mg via INTRAVENOUS
  Filled 2017-12-25: qty 1

## 2017-12-25 MED ORDER — MORPHINE SULFATE (PF) 2 MG/ML IV SOLN
1.0000 mg | INTRAVENOUS | Status: DC | PRN
  Administered 2017-12-25 (×2): 2 mg via INTRAVENOUS
  Administered 2017-12-25: 1 mg via INTRAVENOUS
  Administered 2017-12-25 – 2017-12-26 (×4): 2 mg via INTRAVENOUS
  Filled 2017-12-25 (×7): qty 1

## 2017-12-25 MED ORDER — MORPHINE SULFATE (CONCENTRATE) 10 MG /0.5 ML PO SOLN: 10.0000 mg | ORAL | 0 refills | Status: AC | PRN

## 2017-12-25 MED ORDER — MORPHINE SULFATE (PF) 2 MG/ML IV SOLN: 1.0000 mg | INTRAVENOUS | Status: DC | PRN

## 2017-12-25 NOTE — Plan of Care (Signed)
Emotional support given to patient and family,awaiting hospice bed.Received one time dose order for benadryl for itching,will continue to monitor and follow planned regimen.

## 2017-12-25 NOTE — Discharge Summary (Addendum)
[facility] NAME: Philip [name]    MR#:  [id number]  DATE OF BIRTH:  04/25/1960  DATE OF ADMISSION:  12/23/2017 ADMITTING PHYSICIAN: Saundra Shelling, MD  DATE OF DISCHARGE: No discharge date for patient encounter.  PRIMARY CARE PHYSICIAN: Lorelee Market, MD   ADMISSION DIAGNOSIS:  Altered mental status, unspecified altered mental status type [R41.82]  DISCHARGE DIAGNOSIS:  Active Problems:   Encephalopathy   SECONDARY DIAGNOSIS:   Past Medical History:  Diagnosis Date  . Anemia   . CAD (coronary artery disease)    a. 08/2014 Inf STEMI/CABG x 3 (LIMA->LAD, VG->Diag, RIMA->RCA).  . DVT, recurrent, lower extremity, acute, left ([place]) 2016  . GIB (gastrointestinal bleeding)    a. In the setting of DAPT-->tolerating plavix only.  . Hyperlipidemia   . Hypertensive heart disease   . Lung cancer ([place])    a. s/p chemo/radiation.  Marland Kitchen PAD (peripheral artery disease) ([place])    a. 03/2015 Periph Angio: short occlusion of L pop w/ evidence of embolization into the DP->5.0x50 mm Inova self-expanding stent;  b. 04/08/2015 ABI: R 1.12, L 0.99; c. 01/2017 LE Duplex: bilat SFA obstructive dzs, patent L pop.  . Tobacco abuse   . Toe amputation status, left ([place]) 07/09/2016   2017     ADMITTING HISTORY  HISTORY OF PRESENT ILLNESS: Philip [name]  is a 57 y.o. male with a known history of anemia, coronary artery disease, lung cancer metastatic to bone, charcot joint of the foot, was brought to the emergency room for confusion.  According to family members patient has been confused.  Patient is awake and alert and responds to verbal commands.  Not a great historian does not want to talk much and reviewed information.  Has generalized weakness.  He was worked up with CT head which showed no acute abnormality.  Urine analysis is pending.  Medical service was consulted for further care.  No complaints of any weakness in any part of the body,  slurred speech or any numbness or tingling sensation.  HOSPITAL COURSE:   -Acute metabolic encephalopathy. Etiology unclear but could be due to brain mets/radiation No signs of infection Repeat MRI with poor pictures due to patient moving -Acute hypokalemia -Lung cancer metastatic to bone - Severe protein calorie malnutrition  Patient transition to comfort measures by palliative care as requested by family.  On pain and anxiety medications as needed.  Transfer to hospice home once bed available for end-of-life care.   CONSULTS OBTAINED:  Treatment Team:  Alexis Goodell, MD Sindy Guadeloupe, MD  DRUG ALLERGIES:   Allergies  Allergen Reactions  . Iodinated Diagnostic Agents Other (See Comments) and Swelling    Pt states that he got "knots behind his ears" Pt states that he got "knots behind his ears".  . Tylenol [Acetaminophen] Palpitations  . Amitriptyline Other (See Comments)    Burning sensation all over  . Aspirin   . Nsaids Other (See Comments)    Blood in stools  . Ibuprofen Other (See Comments) and Nausea Only    Blood in stools  . Tape Rash and Other (See Comments)    PLEASE USE COBAN WRAP; THE PATIENT'S SKIN TEARS WHEN BANDAGES ARE REMOVED!!    DISCHARGE MEDICATIONS:   Allergies as of 12/25/2017      Reactions   Iodinated Diagnostic Agents Other (See Comments), Swelling   Pt states that he got "knots behind his ears" Pt states that he got "knots behind his  ears".   Tylenol [acetaminophen] Palpitations   Amitriptyline Other (See Comments)   Burning sensation all over   Aspirin    Nsaids Other (See Comments)   Blood in stools   Ibuprofen Other (See Comments), Nausea Only   Blood in stools   Tape Rash, Other (See Comments)   PLEASE USE COBAN WRAP; THE PATIENT'S SKIN TEARS WHEN BANDAGES ARE REMOVED!!      Medication List    STOP taking these medications   ascorbic acid 500 MG tablet Commonly known as:  VITAMIN C   atorvastatin 40 MG tablet Commonly  known as:  LIPITOR   CALCIUM + D3 PO   clopidogrel 75 MG tablet Commonly known as:  PLAVIX   diphenhydrAMINE 25 mg capsule Commonly known as:  BENADRYL   famotidine 20 MG tablet Commonly known as:  PEPCID   feeding supplement (ENSURE ENLIVE) Liqd   FLOVENT HFA 220 MCG/ACT inhaler Generic drug:  fluticasone   folic acid 1 MG tablet Commonly known as:  FOLVITE   hydrochlorothiazide 12.5 MG tablet Commonly known as:  HYDRODIURIL   lidocaine-prilocaine cream Commonly known as:  EMLA   magnesium oxide 400 (241.3 Mg) MG tablet Commonly known as:  MAG-OX   metoCLOPramide 5 MG tablet Commonly known as:  REGLAN   metoprolol tartrate 25 MG tablet Commonly known as:  LOPRESSOR   multivitamin with minerals Tabs tablet   ondansetron 8 MG disintegrating tablet Commonly known as:  ZOFRAN-ODT   oxyCODONE 5 MG immediate release tablet Commonly known as:  Oxy IR/ROXICODONE   pantoprazole 40 MG tablet Commonly known as:  PROTONIX   potassium phosphate (monobasic) 500 MG tablet Commonly known as:  K-PHOS ORIGINAL   pregabalin 50 MG capsule Commonly known as:  LYRICA   PROAIR HFA 108 (90 Base) MCG/ACT inhaler Generic drug:  albuterol   promethazine 25 MG suppository Commonly known as:  PHENERGAN   senna-docusate 8.6-50 MG tablet Commonly known as:  Senokot-S   sodium chloride 1 g tablet   STIOLTO RESPIMAT 2.5-2.5 MCG/ACT Aers Generic drug:  Tiotropium Bromide-Olodaterol   sucralfate 1 g tablet Commonly known as:  CARAFATE   thiamine 100 MG tablet   tiZANidine 4 MG tablet Commonly known as:  ZANAFLEX   Vitamin D (Ergocalciferol) 50000 units Caps capsule Commonly known as:  DRISDOL     TAKE these medications   morphine CONCENTRATE 10 mg / 0.5 ml concentrated solution Take 0.5 mLs (10 mg total) by mouth every 3 (three) hours as needed for severe pain, anxiety or shortness of breath.       Today   VITAL SIGNS:  Blood pressure 101/82, pulse (!) 146,  temperature 98.7 F (37.1 C), temperature source Oral, resp. rate 19, height 5\' 11"  (1.803 m), weight 52 kg, SpO2 100 %.  I/O:    Intake/Output Summary (Last 24 hours) at 12/25/2017 1015 Last data filed at 12/24/2017 1325 Gross per 24 hour  Intake 556.93 ml  Output -  Net 556.93 ml    PHYSICAL EXAMINATION:  Physical Exam  GENERAL:  57 y.o.-year-old patient lying in the bed with no acute distress.   DATA REVIEW:   CBC Recent Labs  Lab 12/24/17 0447  WBC 5.1  HGB 9.5*  HCT 27.3*  PLT 161    Chemistries  Recent Labs  Lab 12/23/17 1357 12/24/17 0447  NA 139 141  K 3.2* 3.7  CL 107 112*  CO2 20* 23  GLUCOSE 110* 75  BUN 11 13  CREATININE 0.95 0.77  CALCIUM 7.6* 7.5*  AST 25  --   ALT 10  --   ALKPHOS 94  --   BILITOT 1.6*  --     Cardiac Enzymes Recent Labs  Lab 12/23/17 1357  TROPONINI <0.03    Microbiology Results  Results for orders placed or performed during the hospital encounter of 12/23/17  MRSA PCR Screening     Status: None   Collection Time: 12/23/17  6:04 PM  Result Value Ref Range Status   MRSA by PCR NEGATIVE NEGATIVE Final    Comment:        The GeneXpert MRSA Assay (FDA approved for NASAL specimens only), is one component of a comprehensive MRSA colonization surveillance program. It is not intended to diagnose MRSA infection nor to guide or monitor treatment for MRSA infections. Performed at [facility], [address] [place], [place] [address]     RADIOLOGY:  Ct Head Wo Contrast  Result Date: 12/23/2017 CLINICAL DATA:  Altered mental status EXAM: CT HEAD WITHOUT CONTRAST TECHNIQUE: Contiguous axial images were obtained from the base of the skull through the vertex without intravenous contrast. COMPARISON:  10/16/2017 and 06/21/2017 FINDINGS: Brain: Global atrophy. Extensive low-density in the periventricular white matter has progressed. There is no mass effect, midline shift, or acute hemorrhage. There is a  triangular calcification in the periventricular white matter of the right temporal lobe. This is new. See image 49 of series 4. It is of unknown significance. Vascular: No hyperdense vessel or unexpected calcification. Skull: The cranium is intact. Sinuses/Orbits: Mastoid air cells clear. Small amount of mucoid material in the posterior left ethmoid air cell. The visualized paranasal sinuses are otherwise clear. Visualized orbits are within normal limits. Other: Noncontributory. IMPRESSION: Compared to the prior study, diffuse white matter disease has worsened. There is also a small calcification within the white matter of the right temporal lobe. Although these findings may represent progressive chronic ischemic change, given changes this year, this may be related to radiation or chemotherapy. No acute hemorrhage or mass effect. Electronically Signed   By: Marybelle Killings M.D.   On: 12/23/2017 15:03   Mr Jeri Cos JH Contrast  Result Date: 12/24/2017 CLINICAL DATA:  Acute confusion. History of lung cancer with solitary brain metastasis. EXAM: MRI HEAD WITHOUT AND WITH CONTRAST TECHNIQUE: Multiplanar, multiecho pulse sequences of the brain and surrounding structures were obtained without and with intravenous contrast. CONTRAST:  50mL MULTIHANCE GADOBENATE DIMEGLUMINE 529 MG/ML IV SOLN COMPARISON:  Head CT 12/23/2017 and MRI 10/16/2017 FINDINGS: The study is motion degraded throughout. Brain: There is no evidence of acute infarct, intracranial hemorrhage, midline shift, or extra-axial fluid collection. Extensive, confluent cerebral white matter T2 hyperintensity has slightly progressed from the prior MRI. Patchy pontine T2 hyperintensity is unchanged. 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 Postcontrast T1 weighted imaging is severely motion degraded and essentially nondiagnostic. Assessment for residual enhancement of the previously seen right frontal metastasis is precluded by the  degree of motion artifact. This lesion is no longer clearly evident on FLAIR imaging. No new mass or definite vasogenic edema is evident on noncontrast sequences. Vascular: Major intracranial vascular flow voids are preserved. Skull and upper cervical spine: No gross marrow lesion. Sinuses/Orbits: Unremarkable orbits. Mild posterior ethmoid air cell mucosal thickening bilaterally. Bilateral mastoid effusions, enlarged from the prior MRI and likely related radiation therapy. Other: None. IMPRESSION: 1. Motion degraded examination with nondiagnostic post-contrast imaging. This severely limits assessment for metastatic disease. The small right  frontal metastasis on the prior MRI is no longer readily apparent on the noncontrast sequences, and no new mass is evident. 2. No acute infarct. 3. Mild progression of extensive chronic white matter disease which may reflect in part post treatment changes. Electronically Signed   By: Logan Bores M.D.   On: 12/24/2017 10:15   Dg Chest Portable 1 View  Result Date: 12/23/2017 CLINICAL DATA:  Stage IV lung cancer with bone metastases. New onset altered mental status. EXAM: PORTABLE CHEST 1 VIEW COMPARISON:  Chest radiograph 08/26/2017. FINDINGS: There is a right chest wall port a catheter identified with tip at the cavoatrial junction. Heart size appears normal. No pleural effusion or edema. No airspace opacities identified. IMPRESSION: No acute cardiopulmonary abnormalities identified. Electronically Signed   By: Kerby Moors M.D.   On: 12/23/2017 14:32    Follow up with PCP in 1 week.  Management plans discussed with the patient, family and they are in agreement.  CODE STATUS:     Code Status Orders  (From admission, onward)         Start     Ordered   12/24/17 1440  Do not attempt resuscitation (DNR)  Continuous    Question Answer Comment  In the event of cardiac or respiratory ARREST Do not call a "code blue"   In the event of cardiac or respiratory  ARREST Do not perform Intubation, CPR, defibrillation or ACLS   In the event of cardiac or respiratory ARREST Use medication by any route, position, wound care, and other measures to relive pain and suffering. May use oxygen, suction and manual treatment of airway obstruction as needed for comfort.   Comments MOST form in chart.      12/24/17 1440        Code Status History    Date Active Date Inactive Code Status Order ID Comments User Context   12/24/2017 1211 12/24/2017 1440 DNR [id number]  Hillary Bow, MD Inpatient   12/23/2017 1721 12/24/2017 1211 Full Code [id number]  Saundra Shelling, MD ED   08/27/2017 0134 08/30/2017 1519 Full Code [id number]  Arta Silence, MD Inpatient   11/15/2016 2101 11/18/2016 1748 Full Code [id number]  Roseanne Kaufman, MD Inpatient   07/26/2016 1455 07/27/2016 2233 Full Code [id number]  Demetrios Loll, MD Inpatient   03/30/2015 1238 03/30/2015 2101 Full Code [id number]  Wellington Hampshire, MD Inpatient   08/25/2014 1309 08/27/2014 1147 Full Code [id number]  Sara Chu Inpatient   08/24/2014 1339 08/24/2014 2042 Full Code [id number]  Isaias Cowman, MD Inpatient      TOTAL TIME TAKING CARE OF THIS PATIENT ON DAY OF DISCHARGE: more than 30 minutes.   Neita Carp M.D on 12/26/2017 at 7:42 AM  Between 7am to 6pm - Pager - [phone]  After 6pm go to [email or web address] - password [email or web address] [facility]  Office  [phone]  CC: Primary care physician; Lorelee Market, MD  Note: This dictation was prepared with Dragon dictation along with smaller phrase technology. Any transcriptional errors that result from this process are unintentional.

## 2017-12-25 NOTE — Progress Notes (Signed)
[facility] NAME: Philip [name]    MR#:  [id number]  DATE OF BIRTH:  [birth year]  SUBJECTIVE:  CHIEF COMPLAINT:   Chief Complaint  Patient presents with  . Altered Mental Status   Sleeping. Comfortable.  Did not wake patient up.  REVIEW OF SYSTEMS:    Review of Systems  Unable to perform ROS: Mental status change    DRUG ALLERGIES:   Allergies  Allergen Reactions  . Iodinated Diagnostic Agents Other (See Comments) and Swelling    Pt states that he got "knots behind his ears" Pt states that he got "knots behind his ears".  . Tylenol [Acetaminophen] Palpitations  . Amitriptyline Other (See Comments)    Burning sensation all over  . Aspirin   . Nsaids Other (See Comments)    Blood in stools  . Ibuprofen Other (See Comments) and Nausea Only    Blood in stools  . Tape Rash and Other (See Comments)    PLEASE USE COBAN WRAP; THE PATIENT'S SKIN TEARS WHEN BANDAGES ARE REMOVED!!    VITALS:  Blood pressure 101/82, pulse (!) 146, temperature 98.7 F (37.1 C), temperature source Oral, resp. rate 19, height 5\' 11"  (1.803 m), weight 52 kg, SpO2 100 %.  PHYSICAL EXAMINATION:   Physical Exam  GENERAL:  57 y.o.-year-old patient lying in the bed with no acute distress.   LABORATORY PANEL:   CBC Recent Labs  Lab 12/24/17 0447  WBC 5.1  HGB 9.5*  HCT 27.3*  PLT 161   ------------------------------------------------------------------------------------------------------------------ Chemistries  Recent Labs  Lab 12/23/17 1357 12/24/17 0447  NA 139 141  K 3.2* 3.7  CL 107 112*  CO2 20* 23  GLUCOSE 110* 75  BUN 11 13  CREATININE 0.95 0.77  CALCIUM 7.6* 7.5*  AST 25  --   ALT 10  --   ALKPHOS 94  --   BILITOT 1.6*  --    ------------------------------------------------------------------------------------------------------------------  Cardiac Enzymes Recent Labs  Lab 12/23/17 1357  TROPONINI <0.03    ------------------------------------------------------------------------------------------------------------------  RADIOLOGY:  Ct Head Wo Contrast  Result Date: 12/23/2017 CLINICAL DATA:  Altered mental status EXAM: CT HEAD WITHOUT CONTRAST TECHNIQUE: Contiguous axial images were obtained from the base of the skull through the vertex without intravenous contrast. COMPARISON:  10/16/2017 and 06/21/2017 FINDINGS: Brain: Global atrophy. Extensive low-density in the periventricular white matter has progressed. There is no mass effect, midline shift, or acute hemorrhage. There is a triangular calcification in the periventricular white matter of the right temporal lobe. This is new. See image 49 of series 4. It is of unknown significance. Vascular: No hyperdense vessel or unexpected calcification. Skull: The cranium is intact. Sinuses/Orbits: Mastoid air cells clear. Small amount of mucoid material in the posterior left ethmoid air cell. The visualized paranasal sinuses are otherwise clear. Visualized orbits are within normal limits. Other: Noncontributory. IMPRESSION: Compared to the prior study, diffuse white matter disease has worsened. There is also a small calcification within the white matter of the right temporal lobe. Although these findings may represent progressive chronic ischemic change, given changes this year, this may be related to radiation or chemotherapy. No acute hemorrhage or mass effect. Electronically Signed   By: Marybelle Killings M.D.   On: 12/23/2017 15:03   Mr Jeri Cos XQ Contrast  Result Date: 12/24/2017 CLINICAL DATA:  Acute confusion. History of lung cancer with solitary brain metastasis. EXAM: MRI HEAD WITHOUT AND WITH CONTRAST TECHNIQUE: Multiplanar, multiecho pulse sequences of the  brain and surrounding structures were obtained without and with intravenous contrast. CONTRAST:  52mL MULTIHANCE GADOBENATE DIMEGLUMINE 529 MG/ML IV SOLN COMPARISON:  Head CT 12/23/2017 and MRI 10/16/2017  FINDINGS: The study is motion degraded throughout. Brain: There is no evidence of acute infarct, intracranial hemorrhage, midline shift, or extra-axial fluid collection. Extensive, confluent cerebral white matter T2 hyperintensity has slightly progressed from the prior MRI. Patchy pontine T2 hyperintensity is unchanged. 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 Postcontrast T1 weighted imaging is severely motion degraded and essentially nondiagnostic. Assessment for residual enhancement of the previously seen right frontal metastasis is precluded by the degree of motion artifact. This lesion is no longer clearly evident on FLAIR imaging. No new mass or definite vasogenic edema is evident on noncontrast sequences. Vascular: Major intracranial vascular flow voids are preserved. Skull and upper cervical spine: No gross marrow lesion. Sinuses/Orbits: Unremarkable orbits. Mild posterior ethmoid air cell mucosal thickening bilaterally. Bilateral mastoid effusions, enlarged from the prior MRI and likely related radiation therapy. Other: None. IMPRESSION: 1. Motion degraded examination with nondiagnostic post-contrast imaging. This severely limits assessment for metastatic disease. The small right frontal metastasis on the prior MRI is no longer readily apparent on the noncontrast sequences, and no new mass is evident. 2. No acute infarct. 3. Mild progression of extensive chronic white matter disease which may reflect in part post treatment changes. Electronically Signed   By: Logan Bores M.D.   On: 12/24/2017 10:15   Dg Chest Portable 1 View  Result Date: 12/23/2017 CLINICAL DATA:  Stage IV lung cancer with bone metastases. New onset altered mental status. EXAM: PORTABLE CHEST 1 VIEW COMPARISON:  Chest radiograph 08/26/2017. FINDINGS: There is a right chest wall port a catheter identified with tip at the cavoatrial junction. Heart size appears normal. No pleural effusion or edema. No  airspace opacities identified. IMPRESSION: No acute cardiopulmonary abnormalities identified. Electronically Signed   By: Kerby Moors M.D.   On: 12/23/2017 14:32   ASSESSMENT AND PLAN:   57 year old male patient with history of lung cancer metastatic to bone, peripheral arterial disease, DVT, coronary artery disease, hyperlipidemia, hypertension, tobacco abuse presented to the emergency room for confusion  -Acute metabolic encephalopathy. Etiology unclear but could be due to brain mets/radiation No signs of infection Repeat MRI with poor pictures due to patient moving -Acute hypokalemia -Lung cancer metastatic to bone - Severe protein calorie malnutrition  After discussing with palliative care family requested comfort measures and transfer to hospice home. Pain and anxiety medications added. Transfer to hospice home in bed available.  All the records are reviewed and case discussed with Care Management/Social Worker Management plans discussed with the patient, family and they are in agreement.  CODE STATUS: FULL CODE  DVT Prophylaxis: SCDs  TOTAL TIME TAKING CARE OF THIS PATIENT: 30 minutes.   POSSIBLE D/C IN 2-3 DAYS, DEPENDING ON CLINICAL CONDITION.  Neita Carp M.D on 12/25/2017 at 10:12 AM  Between 7am to 6pm - Pager - [phone]  After 6pm go to [email or web address] - password [email or web address] [facility]  Office  [phone]  CC: Primary care physician; Lorelee Market, MD  Note: This dictation was prepared with Dragon dictation along with smaller phrase technology. Any transcriptional errors that result from this process are unintentional.

## 2017-12-25 NOTE — Progress Notes (Signed)
Per [facility] liaison there is not a bed for patient today at the hospice facility. Clinical  Social Worker (CSW) met with patient's wife Mariann Laster and other family members at bedside and made them aware of above. CSW answered their questions. Plan is for patient to D/C to the [facility] facility when bed is available.   [employer], LCSW [phone]

## 2017-12-25 NOTE — Progress Notes (Signed)
Verbal order from MD to discontinue VS order due to comfort care measures and pt gets agitated when nursing staff attempts to get VS.   Alanson Puls

## 2017-12-25 NOTE — Progress Notes (Signed)
12/25/17 1359  Clinical Encounter Type  Visited With Patient and family together  Visit Type Follow-up;Spiritual support;Patient actively dying  Referral From Palliative care team  Recommendations Follow-up as needed  Spiritual Encounters  Spiritual Needs Emotional;Prayer;Other (Comment) (End of Life Care)  Stress Factors  Patient Stress Factors  (Withdrawn, End of Life)  Family Stress Factors  (Patient's end of life)   Chaplain met with the family with Crystal from Palliative Care. After a reassuring conversation about the patient's end of life and his comfort care, Chaplain remained with the family to provide additional pastoral care. Chaplain discussed celebrating the patient's life with music, stories, and personal reconciliations.  Further, Chaplain spoke with the patient's daughter about her belief that the patient may spontaneously "sit up" and communicate his EOL choices. Chaplain offered to meet with her separately, but also advised the daughter to speak directly to the patient. Overall, the family has consensus that the current course of action is what the patient would want.

## 2017-12-25 NOTE — Progress Notes (Addendum)
Daily Progress Note   Patient Name: Philip [name]       Date: 12/25/2017 DOB: 1961-03-20  Age: 57 y.o. MRN#: [id number] Attending Physician: Hillary Bow, MD Primary Care Physician: Lorelee Market, MD Admit Date: 12/23/2017  Reason for Consultation/Follow-up: Establishing goals of care  Subjective: Patient is resting in bed. He does not speak to this Probation officer. Family is concerned if they made a correct decision for comfort. Upon a family member trying to touch and engage him, he stated "let me, let me live" as he was attempting to push them away. Discussed that he is refusing food, water, and oral medications. They feel he would not want a feeding tube, or to resume aggressive care.  Chaplain to bedside.   Continuing comfort care.     Length of Stay: 0  Current Medications: Scheduled Meds:  . nicotine  21 mg Transdermal Daily  . sodium chloride flush  3 mL Intravenous Q12H    Continuous Infusions: . sodium chloride      PRN Meds: sodium chloride, acetaminophen **OR** acetaminophen, antiseptic oral rinse, glycopyrrolate **OR** glycopyrrolate **OR** glycopyrrolate, haloperidol **OR** haloperidol **OR** haloperidol lactate, LORazepam, morphine injection, ondansetron **OR** ondansetron (ZOFRAN) IV, oxyCODONE, polyvinyl alcohol, senna-docusate, sodium chloride flush  Physical Exam  Constitutional: No distress.  Pulmonary/Chest: Effort normal.  Skin: Skin is warm and dry.            Vital Signs: BP 101/82   Pulse (!) 146   Temp 98.7 F (37.1 C) (Oral)   Resp 19   Ht 5\' 11"  (1.803 m)   Wt 52 kg   SpO2 100%   BMI 16.00 kg/m  SpO2: SpO2: 100 % O2 Device: O2 Device: Room Air O2 Flow Rate:    Intake/output summary: No intake or output data in the 24 hours ending 12/25/17  1338 LBM: Last BM Date: 12/21/17 Baseline Weight: Weight: 52 kg Most recent weight: Weight: 52 kg       Palliative Assessment/Data: 10%      Patient Active Problem List   Diagnosis Date Noted  . Acute encephalopathy 12/25/2017  . Encephalopathy 12/23/2017  . Left upper lobe pulmonary nodule 12/08/2017  . Enteropathogenic Escherichia coli infection 12/02/2017  . Metastasis to brain ([place]) 10/18/2017  . White matter disease 10/18/2017  . Folate deficiency 09/10/2017  . Adrenal insufficiency (  [place]) 09/02/2017  . Intractable nausea and vomiting 08/27/2017  . Protein-calorie malnutrition, severe 08/27/2017  . Weakness 08/02/2017  . Lung cancer metastatic to bone ([place]) 04/24/2017  . 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 Marijuana user 04/24/2017  . Abnormal drug screen 04/24/2017  . Skin abscess 04/17/2017  . Non-intractable vomiting with nausea 01/20/2017  . Hypokalemia 01/20/2017  . Dehydration 01/20/2017  . Abscess of left hand 11/13/2016  . Chronic thoracic spine pain 10/28/2016  . Antineoplastic chemotherapy induced anemia 10/28/2016  . Encounter for antineoplastic chemotherapy 09/10/2016  . Charcot's joint of foot (Right) 08/21/2016  . GERD (gastroesophageal reflux disease) 08/21/2016  . Myocardial infarction ([place]) 08/21/2016  . Hyponatremia 08/19/2016  . Hypocalcemia 08/19/2016  . Hypomagnesemia 08/19/2016  . Routine history and physical examination of adult 08/09/2016  . Cancer of upper lobe of left lung ([place]) 07/31/2016  . Cancer related pain 07/31/2016  . Cough 07/31/2016  . Goals of care, counseling/discussion 07/26/2016  . Acute CVA (cerebrovascular accident) ([place]) 07/26/2016  . Small cell lung cancer ([place]) 07/19/2016  . Postobstructive pneumonia 07/17/2016  . Bone metastasis ([place]) 07/12/2016  . Lymphadenopathy, mediastinal 07/02/2016  . Weight loss 07/02/2016  . Night sweats 07/02/2016  . Chronic foot pain (Left) 03/21/2016  . Chronic pain syndrome  03/21/2016  . Neuropathic pain 03/21/2016  . Iron deficiency anemia 01/08/2016  . Dry gangrene ([place]) 01/02/2016  . Osteopenia of both feet 12/10/2015  . Peripheral neuropathy 06/12/2015  . Hyperlipidemia, unspecified   . PAD (peripheral artery disease) ([place]) 03/29/2015  . Hypertension 10/05/2014  . Pure hypercholesterolemia 10/05/2014  . Acute sinusitis, unspecified 09/14/2014  . Rash and other nonspecific skin eruption 09/14/2014  . S/P CABG x 3 08/25/2014  . Status post aorto-coronary artery bypass graft 08/25/2014  . History of MI (myocardial infarction) 08/24/2014  . CAD (coronary artery disease), native coronary artery 08/24/2014  . Acute myocardial infarction of inferior wall ([place]) 08/24/2014  . Right ankle pain 07/20/2014  . Pain in joint involving ankle and foot 07/20/2014  . Bilateral foot pain 07/06/2014  . Tobacco use disorder 07/06/2014  . Convulsions ([place]) 07/06/2014  . Preventative health care 07/06/2014  . Pain in soft tissues of limb 07/06/2014    Palliative Care Assessment & Plan    Recommendations/Plan:  Comfort care. Plans for hospice home placement.    Agree with Morphine and Oxycodone. Haldol for agitation. Ativan added for anxiety.   Code Status:    Code Status Orders  (From admission, onward)         Start     Ordered   12/24/17 1440  Do not attempt resuscitation (DNR)  Continuous    Question Answer Comment  In the event of cardiac or respiratory ARREST Do not call a "code blue"   In the event of cardiac or respiratory ARREST Do not perform Intubation, CPR, defibrillation or ACLS   In the event of cardiac or respiratory ARREST Use medication by any route, position, wound care, and other measures to relive pain and suffering. May use oxygen, suction and manual treatment of airway obstruction as needed for comfort.   Comments MOST form in chart.      12/24/17 1440        Code Status History    Date Active Date Inactive Code Status Order ID  Comments User Context   12/24/2017 1211 12/24/2017 1440 DNR [id number]  Hillary Bow, MD Inpatient   12/23/2017 1721 12/24/2017 1211 Full Code [id number]  Saundra Shelling, MD ED  08/27/2017 0134 08/30/2017 1519 Full Code [id number]  Arta Silence, MD Inpatient   11/15/2016 2101 11/18/2016 1748 Full Code [id number]  Roseanne Kaufman, MD Inpatient   07/26/2016 1455 07/27/2016 2233 Full Code [id number]  Demetrios Loll, MD Inpatient   03/30/2015 1238 03/30/2015 2101 Full Code [id number]  Wellington Hampshire, MD Inpatient   08/25/2014 1309 08/27/2014 1147 Full Code [id number]  Sara Chu Inpatient   08/24/2014 1339 08/24/2014 2042 Full Code [id number]  Isaias Cowman, MD Inpatient       Prognosis:   < 2 weeks  Discharge Planning:  Hospice facility  Care plan was discussed with Hospice liaison, chaplain  Thank you for allowing the Palliative Medicine Team to assist in the care of this patient.   Time In: 1:05 Time Out: 1:55 Total Time 50 min Prolonged Time Billed       Greater than 50%  of this time was spent counseling and coordinating care related to the above assessment and plan.  Asencion Gowda, NP  Please contact Palliative Medicine Team phone at [phone] for questions and concerns.

## 2017-12-25 NOTE — Progress Notes (Signed)
Visit made to new hospice home referral. Patient is a 57 year old man with a known history of metastatic small cell lung cancer, Mets to the bone and brain, CAD s/p CABG, DVT, GI Bleed, PAD, HLD and anemia admitted to [facility] on 9/9 for treatment of acute metabolic encephalopathy. He has continued to decline despite medical interventions, refusing food and oral medications. Palliative Medicine was consulted for goals of care and met with patient and his family on 9/10 and again today, they have chosen to focus on comfort with a  transfer to the hospice home. Patient has been requiring IV morphine 1 mg  for pain, frequency adjusted to q 2 hrs PRN , by Palliative Medicine NP Azalia Bilis. Writer met in the room with patient's wife Mariann Laster, son and daughter and son in law to initiated education regarding hospice services, philosophy and team approach to care with good understanding voiced. Questions answered, emotional support and education given. Plan is for transfer via EMS to the hospice home tomorrow with signed DNR in place. Patient remained asleep through out the visit and did not participate in the conversation. Hospital care team updated. Updated information faxed to referral. Thank you for the opportunity to be involved in the care of this patient and his family. Flo Shanks RN, BSN, [facility] [place], hospital liaison [phone]

## 2017-12-26 MED ORDER — DIPHENHYDRAMINE HCL 50 MG/ML IJ SOLN
25.0000 mg | Freq: Once | INTRAMUSCULAR | Status: AC
  Administered 2017-12-26: 25 mg via INTRAVENOUS
  Filled 2017-12-26: qty 1

## 2017-12-26 NOTE — Progress Notes (Signed)
Patient will D/C to [facility] today. [facility] liaison is aware of above. Clinical Education officer, museum (CSW) completed EMS form. Please reconsult if future social work needs arise. CSW signing off.   [employer], LCSW [phone]

## 2017-12-26 NOTE — Progress Notes (Signed)
Follow up visit made to new hospice home referral. Patient seen lying in bed, appeared to be sleeping. Wife Mariann Laster and daughter Hal Hope present in the room. Updated regarding plan to transfer to the Hospice home this morning via EMS. Hospital care team updated. Discharge summary and consents faxed to referral. Emotional support given to family. Report called to the hospice home EMS notified for transport.Thank you. Flo Shanks RN, BSN, [facility] [place], hospital liaison [phone]

## 2017-12-26 NOTE — Progress Notes (Signed)
   12/26/17 0800  Clinical Encounter Type  Visited With Patient and family together  Visit Type Initial;Spiritual support;Patient actively dying  Recommendations Follow-up as needed  Spiritual Encounters  Spiritual Needs Emotional;Grief support;Prayer  Stress Factors  Patient Stress Factors  (End of Life)   Chaplain met with wife, daughter, and patient to follow-up on previous conversations. The patient's wife was able to get some rest. The daughter still hopes for a moment of lucidity where she can converse with the patient about his wishes.  Chaplain tried to prepare her for the possibility that this moment may not occur, particularly since the patient has been agitated.

## 2017-12-27 ENCOUNTER — Ambulatory Visit: Payer: Medicaid Other

## 2017-12-30 ENCOUNTER — Inpatient Hospital Stay: Payer: Medicaid Other | Admitting: Hematology and Oncology

## 2018-01-02 ENCOUNTER — Ambulatory Visit: Payer: Medicaid Other | Admitting: Hematology and Oncology

## 2018-01-14 DEATH — deceased

## 2018-02-04 ENCOUNTER — Encounter: Payer: Medicaid Other | Admitting: Student in an Organized Health Care Education/Training Program

## 2018-06-18 IMAGING — PT NM PET TUM IMG INITIAL (PI) SKULL BASE T - THIGH
10 series · 25 of 25 positions shown · non-contrast
Comparison: Chest CT 06/28/2016.

CLINICAL DATA: Initial treatment strategy for weight loss with
night sweats and mediastinal adenopathy. Smoking history..

EXAM:
NUCLEAR MEDICINE PET SKULL BASE TO THIGH
TECHNIQUE: 13.1 mCi F-18 FDG was injected intravenously. Full-ring PET imaging
was performed from the skull base to thigh after the radiotracer. CT
data was obtained and used for attenuation correction and anatomic
localization.
FASTING BLOOD GLUCOSE:  Value: 99 mg/dl

[Series 3: ct wb 5.0 b30f · axial · 5.0mm · 0.98mm/px · z∈[-1490,-506]mm · 3 of 329 slices shown]
[im 1/329]
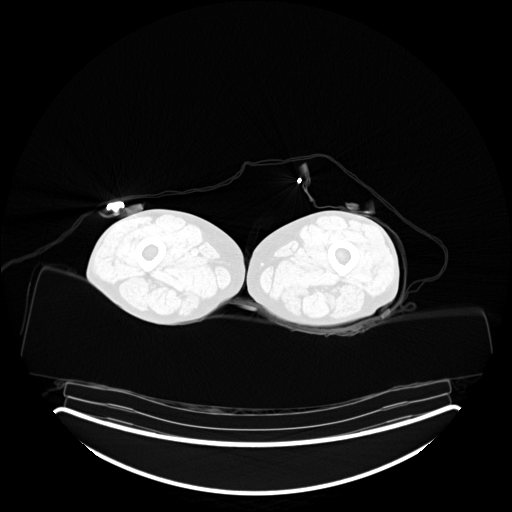
[im 165/329]
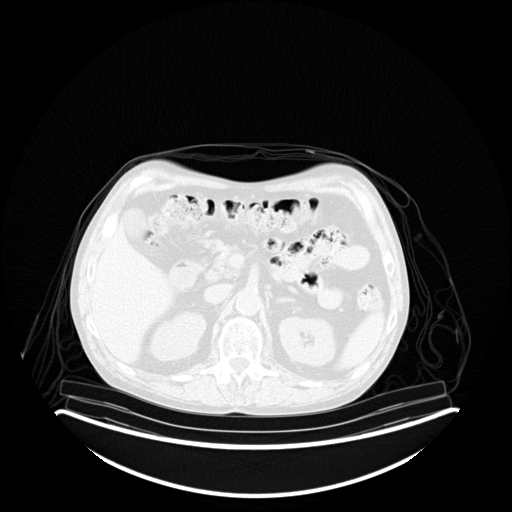
[im 329/329  brain]
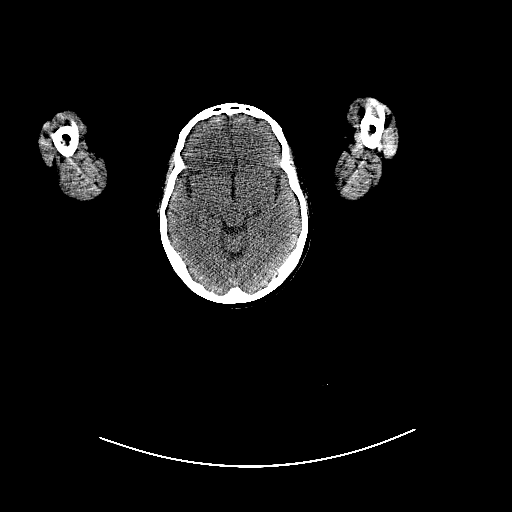

[Series 4: pet wb (ac) · axial · 5.0mm · 4.07mm/px · z∈[-1490,-506]mm · 4 of 329 slices shown]
[im 1/329]
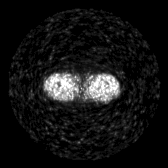
[im 110/329]
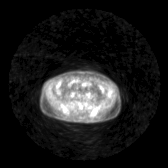
[im 219/329]
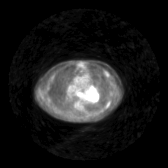
[im 329/329]
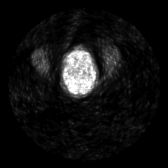

[Series 5: pet wb uncorrected (nac) · axial · 5.0mm · 4.07mm/px · z∈[-1490,-506]mm · 4 of 329 slices shown]
[im 1/329]
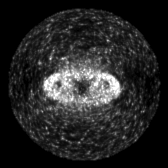
[im 110/329]
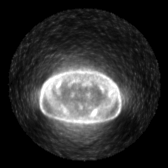
[im 219/329]
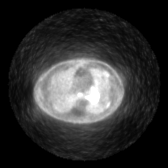
[im 329/329]
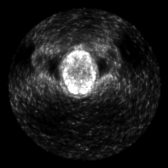

[Series 603: pet_ct axial fused · 4 of 327 slices shown]
[im 1/327]
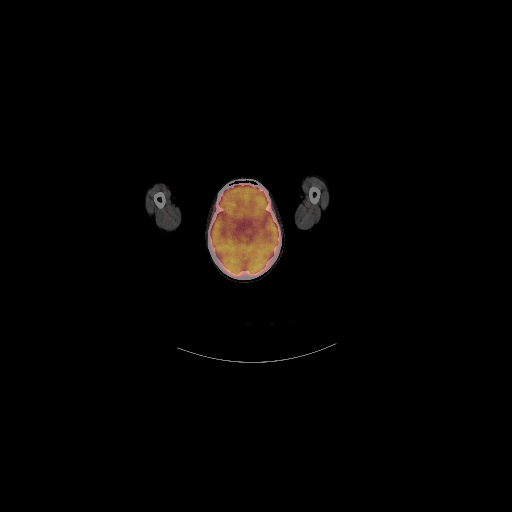
[im 109/327]
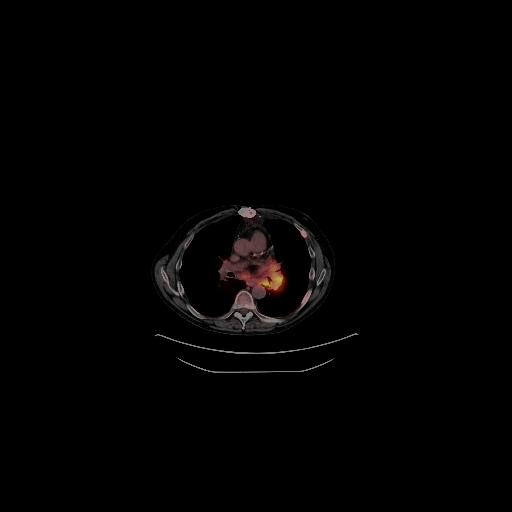
[im 218/327]
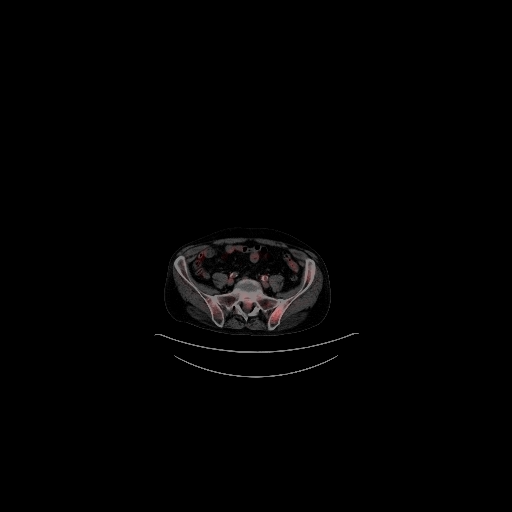
[im 327/327]
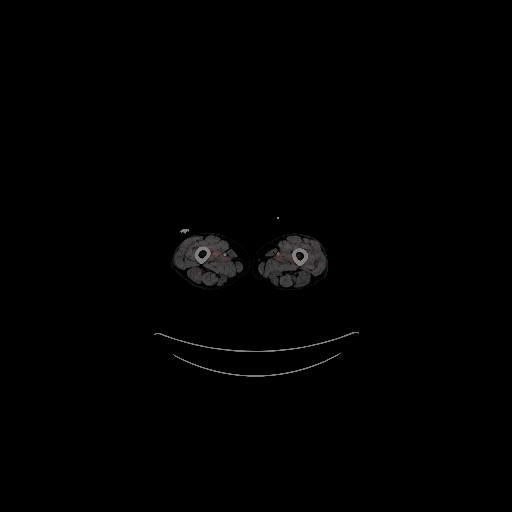

[Series 604: pet_ct coronal fused · 1 of 92 slices shown]
[im 1/92]
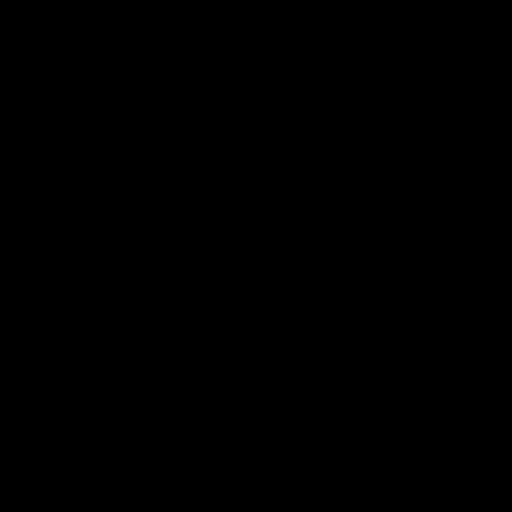

[Series 605: pet_ct sagittal fused · 2 of 133 slices shown]
[im 1/133]
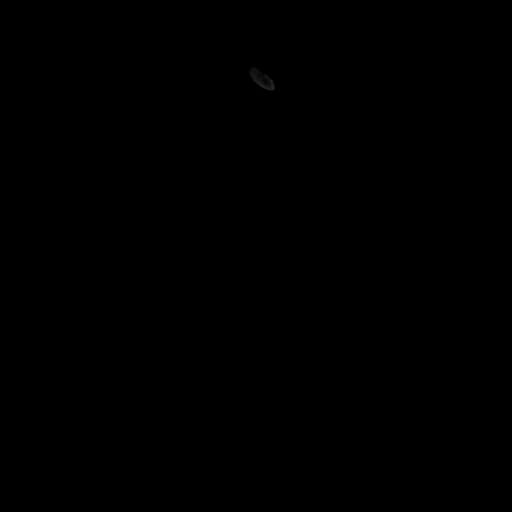
[im 133/133]
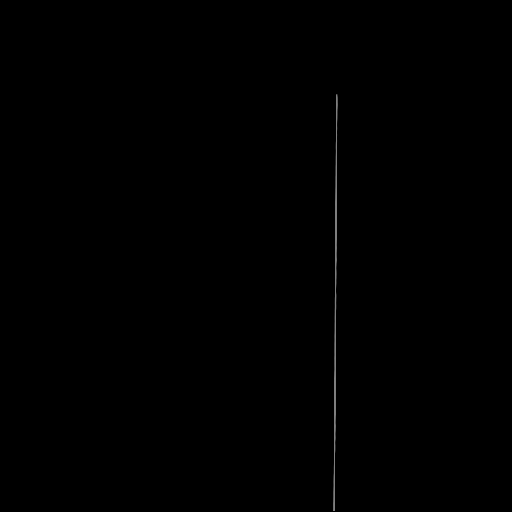

[Series 606: pet axial · 4 of 329 slices shown]
[im 1/329]
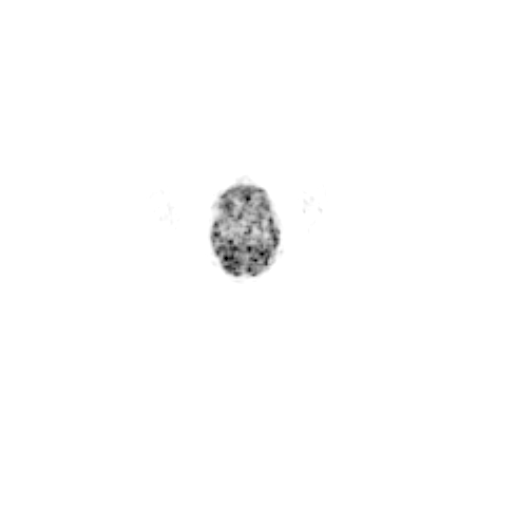
[im 110/329]
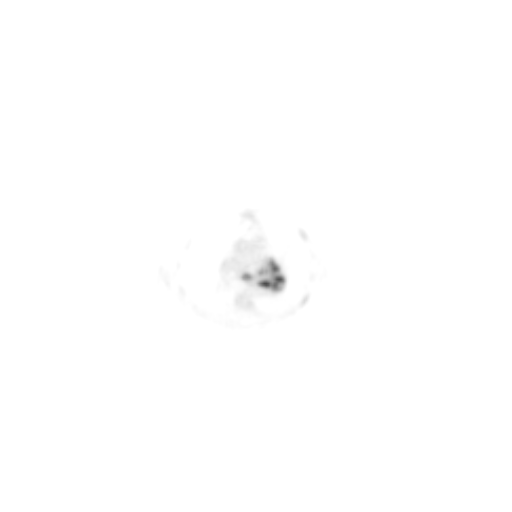
[im 219/329]
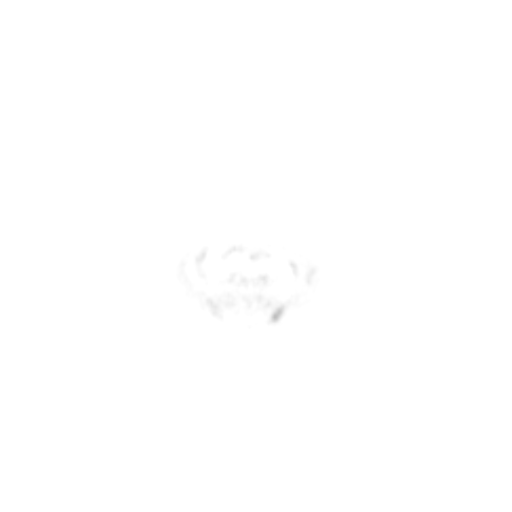
[im 329/329]
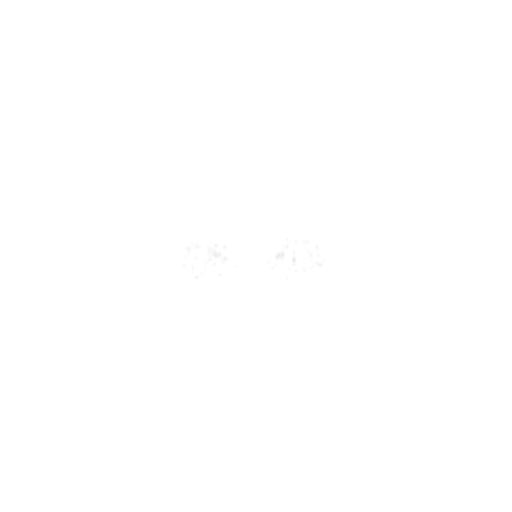

[Series 607: pet coronal · 1 of 106 slices shown]
[im 1/106]
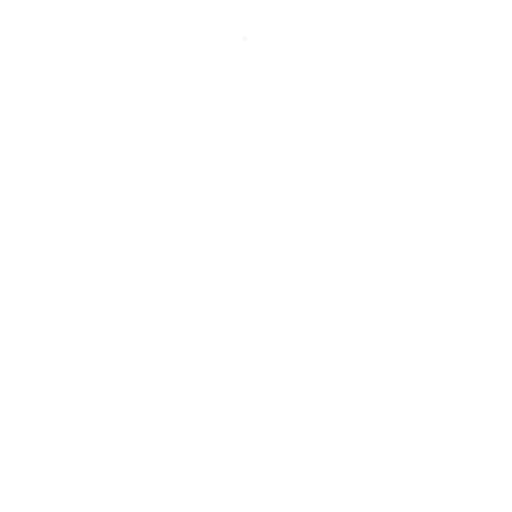

[Series 609: pet sagittal · 1 of 125 slices shown]
[im 1/125]
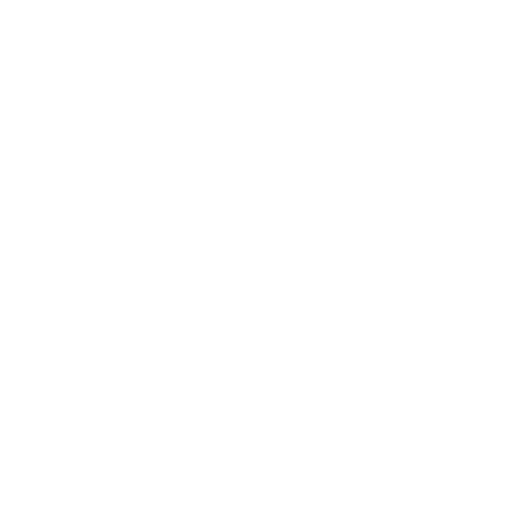

[Series 1032: results mm oncology reading · 3.0mm · 0.89mm/px · 1 of 10 slices shown]
[im 1/10]
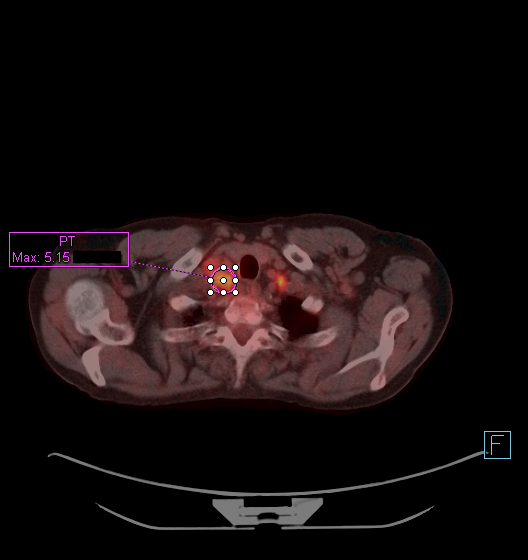

[25 of 25 positions shown; findings below may reference images not displayed]

FINDINGS: NECK

Bilateral low jugular/ supraclavicular nodal metastasis. Example
right-sided node which measures 9 mm and a S.U.V. max of 5.1 on
image 66/series 3. Bilateral carotid atherosclerosis.

CHEST

Central left suprahilar and upper lobe infiltrative lung primary.
This measures on the order of 6.9 x 4.4 cm and a S.U.V. max of
on image 106/series 3.

Contralateral mediastinal nodal metastasis, with an index right
paratracheal node measuring 1.9 cm and a S.U.V. max of 10.7 on image
95/series 3. Prevascular nodes measure up to 1.4 cm and a S.U.V. max
of 10.2 on image 92/series 3.

Mild hypermetabolism corresponding to ill-defined left lower lobe
peribronchovascular opacity, including on image 128/series 3. This
is new, suspicious for infection or postobstructive pneumonitis.

Prior median sternotomy. Mild cardiomegaly. Centrilobular and
paraseptal emphysema. 4 mm left upper lobe pulmonary nodule is below
PET resolution.

ABDOMEN/PELVIS

No abdominopelvic parenchymal or nodal hypermetabolism. Gallstones
of maximally 9 mm. Abdominal aortic atherosclerosis. Scattered
colonic diverticula.

SKELETON

Multifocal hypermetabolic osseous metastasis. Focus of
hypermetabolism within the tenth posteromedial right rib likely
corresponds to a nondisplaced fracture on image 132/ series 3. This
measures a S.U.V. max of 4.5. There is a left iliac area of vague
hypermetabolism an subtle sclerosis which measures a S.U.V. max of
4.3 on image 216/ series 3. Subtle hypermetabolism involves the C5
vertebral body, measuring a S.U.V. max of 3.6. Left hip
osteoarthritis.
IMPRESSION: 1. Central left upper lobe/suprahilar primary bronchogenic
carcinoma.
2. Nodal metastasis within the chest and supraclavicular/low jugular
regions.
3. Multifocal osseous metastasis. Possibly pathologic fracture
involving the posteromedial right tenth rib.
4. New left lower lobe opacity with mild hypermetabolism, suspicious
for infection or postobstructive pneumonitis.
5. Cholelithiasis.

## 2021-08-16 NOTE — Telephone Encounter (Signed)
error
